# Patient Record
Sex: Male | Born: 1965
Health system: Southern US, Community
[De-identification: ages and names within clinical notes are randomized; demographics above are authoritative.]

## PROBLEM LIST (undated history)

## (undated) DIAGNOSIS — I10 Essential (primary) hypertension: Secondary | ICD-10-CM

## (undated) DIAGNOSIS — E119 Type 2 diabetes mellitus without complications: Secondary | ICD-10-CM

## (undated) DIAGNOSIS — R1310 Dysphagia, unspecified: Secondary | ICD-10-CM

## (undated) DIAGNOSIS — F319 Bipolar disorder, unspecified: Secondary | ICD-10-CM

## (undated) DIAGNOSIS — E785 Hyperlipidemia, unspecified: Secondary | ICD-10-CM

## (undated) DIAGNOSIS — J449 Chronic obstructive pulmonary disease, unspecified: Secondary | ICD-10-CM

## (undated) DIAGNOSIS — F39 Unspecified mood [affective] disorder: Secondary | ICD-10-CM

## (undated) DIAGNOSIS — J4489 Other specified chronic obstructive pulmonary disease: Secondary | ICD-10-CM

## (undated) DIAGNOSIS — G629 Polyneuropathy, unspecified: Secondary | ICD-10-CM

## (undated) DIAGNOSIS — F419 Anxiety disorder, unspecified: Secondary | ICD-10-CM

## (undated) DIAGNOSIS — R0602 Shortness of breath: Secondary | ICD-10-CM

## (undated) DIAGNOSIS — Z931 Gastrostomy status: Secondary | ICD-10-CM

## (undated) DIAGNOSIS — E46 Unspecified protein-calorie malnutrition: Secondary | ICD-10-CM

## (undated) DIAGNOSIS — J690 Pneumonitis due to inhalation of food and vomit: Secondary | ICD-10-CM

## (undated) DIAGNOSIS — F17200 Nicotine dependence, unspecified, uncomplicated: Secondary | ICD-10-CM

## (undated) DIAGNOSIS — D619 Aplastic anemia, unspecified: Secondary | ICD-10-CM

## (undated) DIAGNOSIS — D649 Anemia, unspecified: Secondary | ICD-10-CM

## (undated) DIAGNOSIS — G40909 Epilepsy, unspecified, not intractable, without status epilepticus: Secondary | ICD-10-CM

## (undated) DIAGNOSIS — F1410 Cocaine abuse, uncomplicated: Secondary | ICD-10-CM

## (undated) DIAGNOSIS — R079 Chest pain, unspecified: Secondary | ICD-10-CM

## (undated) HISTORY — DX: Hyperlipidemia, unspecified: E78.5

## (undated) HISTORY — DX: Aplastic anemia, unspecified: D61.9

## (undated) HISTORY — DX: Epilepsy, unspecified, not intractable, without status epilepticus: G40.909

## (undated) HISTORY — PX: OTHER SURGICAL HISTORY: SHX169

## (undated) HISTORY — DX: Chronic obstructive pulmonary disease, unspecified: J44.9

## (undated) HISTORY — DX: Other specified chronic obstructive pulmonary disease: J44.89

## (undated) HISTORY — DX: Cocaine abuse, uncomplicated: F14.10

## (undated) HISTORY — DX: Nicotine dependence, unspecified, uncomplicated: F17.200

## (undated) HISTORY — DX: Shortness of breath: R06.02

## (undated) HISTORY — DX: Essential (primary) hypertension: I10

## (undated) HISTORY — DX: Chest pain, unspecified: R07.9

## (undated) HISTORY — DX: Polyneuropathy, unspecified: G62.9

## (undated) HISTORY — DX: Type 2 diabetes mellitus without complications: E11.9

---

## 2006-07-19 ENCOUNTER — Ambulatory Visit: Payer: Self-pay | Admitting: Cardiology

## 2010-09-30 DIAGNOSIS — R079 Chest pain, unspecified: Secondary | ICD-10-CM

## 2010-10-01 ENCOUNTER — Inpatient Hospital Stay (HOSPITAL_COMMUNITY)
Admission: AD | Admit: 2010-10-01 | Discharge: 2010-10-02 | DRG: 287 | Disposition: A | Discharge status: Home / Self Care | Payer: Medicaid Other | Source: Other Acute Inpatient Hospital | Attending: Cardiology | Admitting: Cardiology

## 2010-10-01 DIAGNOSIS — E669 Obesity, unspecified: Secondary | ICD-10-CM | POA: Diagnosis present

## 2010-10-01 DIAGNOSIS — E119 Type 2 diabetes mellitus without complications: Secondary | ICD-10-CM | POA: Diagnosis present

## 2010-10-01 DIAGNOSIS — K219 Gastro-esophageal reflux disease without esophagitis: Secondary | ICD-10-CM | POA: Diagnosis present

## 2010-10-01 DIAGNOSIS — R079 Chest pain, unspecified: Secondary | ICD-10-CM

## 2010-10-01 DIAGNOSIS — F1410 Cocaine abuse, uncomplicated: Secondary | ICD-10-CM | POA: Diagnosis present

## 2010-10-01 DIAGNOSIS — Z794 Long term (current) use of insulin: Secondary | ICD-10-CM

## 2010-10-01 DIAGNOSIS — I10 Essential (primary) hypertension: Secondary | ICD-10-CM | POA: Diagnosis present

## 2010-10-01 DIAGNOSIS — R0789 Other chest pain: Principal | ICD-10-CM | POA: Diagnosis present

## 2010-10-01 DIAGNOSIS — E785 Hyperlipidemia, unspecified: Secondary | ICD-10-CM | POA: Diagnosis present

## 2010-10-01 DIAGNOSIS — E781 Pure hyperglyceridemia: Secondary | ICD-10-CM | POA: Diagnosis present

## 2010-10-01 DIAGNOSIS — I2510 Atherosclerotic heart disease of native coronary artery without angina pectoris: Secondary | ICD-10-CM | POA: Diagnosis present

## 2010-10-01 DIAGNOSIS — G40909 Epilepsy, unspecified, not intractable, without status epilepticus: Secondary | ICD-10-CM | POA: Diagnosis present

## 2010-10-01 DIAGNOSIS — R9430 Abnormal result of cardiovascular function study, unspecified: Secondary | ICD-10-CM

## 2010-10-01 LAB — GLUCOSE, CAPILLARY: Glucose-Capillary: 300 mg/dL — ABNORMAL HIGH (ref 70–99)

## 2010-10-02 DIAGNOSIS — I2119 ST elevation (STEMI) myocardial infarction involving other coronary artery of inferior wall: Secondary | ICD-10-CM

## 2010-10-02 HISTORY — PX: CARDIAC CATHETERIZATION: SHX172

## 2010-10-02 LAB — TSH: TSH: 1.232 u[IU]/mL (ref 0.350–4.500)

## 2010-10-02 LAB — HEMOGLOBIN A1C: Hgb A1c MFr Bld: 7.9 % — ABNORMAL HIGH (ref <5.7)

## 2010-10-02 LAB — GLUCOSE, CAPILLARY: Glucose-Capillary: 227 mg/dL — ABNORMAL HIGH (ref 70–99)

## 2010-10-02 LAB — LIPID PANEL
Cholesterol: 395 mg/dL — ABNORMAL HIGH (ref 0–200)
Triglycerides: 2775 mg/dL — ABNORMAL HIGH (ref <150)
VLDL: UNDETERMINED mg/dL (ref 0–40)

## 2010-10-06 LAB — GLUCOSE, CAPILLARY

## 2010-10-07 NOTE — Cardiovascular Report (Signed)
  NAMEMarland Kitchen  MAICOL, BOWLAND NO.:  [id number]  MEDICAL RECORD NO.:  [id number]  LOCATION:  3736                         FACILITY:  [facility]  PHYSICIAN:  Rollene Rotunda, MD, FACCDATE OF BIRTH:  1965-06-20  DATE OF PROCEDURE:  10/02/2010 DATE OF DISCHARGE:                           CARDIAC CATHETERIZATION   PRIMARY CARE PHYSICIAN:  Wyvonnia Lora, MD  CARDIOLOGIST:  Learta Codding, MD,FACC  PROCEDURE:  Left heart catheterization/coronary arteriography.  INDICATION:  The patient with chest pain and questionable mild inferolateral ischemia on a stress perfusion study.  PROCEDURE NOTE:  Left heart catheterization was performed with right femoral artery and tibial artery was cannulated using the anterior wall puncture.  A #5-French arterial sheath was inserted via the modified Seldinger technique.  Preformed Judkins and pigtail catheter were utilized.  The patient tolerated the procedure well and left the lab in stable condition.  RESULTS:  Hemodynamics LV 126/17, AO 129/88.  The coronaries left main normal, LAD mid luminal irregularity.  There were 3 small diagonals which were normal.  The LAD does not wrap the apex and was not particularly large vessel.  The circumflex in the AV groove had a long 25-30% stenosis entering into a large second marginal.  First marginal was small and normal.  Second marginal was large and normal.  The ramus intermediate was small and normal.  The right coronary artery was a large dominant vessel.  It was normal throughout its course.  PDA was large and normal.  Posterolateral was small branching and normal.  Left ventriculogram was obtained in the RAO projection.  The EF was 65% with normal wall motion.  CONCLUSION:  Nonobstructive mild coronary plaque.  No further cardiac workup is suggested.  The patient will follow up with Dr. Neita Carp for evaluation of nonanginal chest pain.     Rollene Rotunda, MD, [facility]     JH/MEDQ  D:   10/02/2010  T:  10/02/2010  Job:  [id number]  cc:   Fara Chute, MD  Electronically Signed by Rollene Rotunda MD [facility] on 10/07/2010 02:22:00 PM
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
## 2010-10-23 ENCOUNTER — Encounter: Payer: Medicaid Other | Admitting: Cardiology

## 2010-11-10 ENCOUNTER — Encounter: Payer: Self-pay | Admitting: Cardiology

## 2010-11-11 ENCOUNTER — Encounter: Payer: Self-pay | Admitting: *Deleted

## 2010-11-11 ENCOUNTER — Encounter: Payer: Self-pay | Admitting: Cardiology

## 2010-11-11 ENCOUNTER — Encounter: Payer: Medicaid Other | Admitting: Cardiology

## 2010-11-26 ENCOUNTER — Encounter (INDEPENDENT_AMBULATORY_CARE_PROVIDER_SITE_OTHER): Payer: Medicaid Other | Admitting: Cardiology

## 2010-11-26 NOTE — Progress Notes (Signed)
  Pt was rescheduled due to being sick. He will see primary MD today.

## 2010-11-26 NOTE — Progress Notes (Signed)
This encounter was created in error - please disregard.

## 2010-12-10 ENCOUNTER — Encounter: Payer: Medicaid Other | Admitting: Cardiology

## 2010-12-19 ENCOUNTER — Encounter: Payer: Self-pay | Admitting: Cardiology

## 2010-12-19 ENCOUNTER — Ambulatory Visit (INDEPENDENT_AMBULATORY_CARE_PROVIDER_SITE_OTHER): Payer: Medicaid Other | Admitting: Physician Assistant

## 2010-12-19 VITALS — BP 121/86 | HR 95 | Ht 72.0 in | Wt 243.0 lb

## 2010-12-19 DIAGNOSIS — F17200 Nicotine dependence, unspecified, uncomplicated: Secondary | ICD-10-CM

## 2010-12-19 DIAGNOSIS — E1165 Type 2 diabetes mellitus with hyperglycemia: Secondary | ICD-10-CM | POA: Insufficient documentation

## 2010-12-19 DIAGNOSIS — E781 Pure hyperglyceridemia: Secondary | ICD-10-CM

## 2010-12-19 DIAGNOSIS — I10 Essential (primary) hypertension: Secondary | ICD-10-CM | POA: Insufficient documentation

## 2010-12-19 DIAGNOSIS — Z720 Tobacco use: Secondary | ICD-10-CM

## 2010-12-19 DIAGNOSIS — F17210 Nicotine dependence, cigarettes, uncomplicated: Secondary | ICD-10-CM | POA: Insufficient documentation

## 2010-12-19 DIAGNOSIS — K219 Gastro-esophageal reflux disease without esophagitis: Secondary | ICD-10-CM

## 2010-12-19 DIAGNOSIS — I2510 Atherosclerotic heart disease of native coronary artery without angina pectoris: Secondary | ICD-10-CM

## 2010-12-19 DIAGNOSIS — E785 Hyperlipidemia, unspecified: Secondary | ICD-10-CM

## 2010-12-19 MED ORDER — ASPIRIN EC 81 MG PO TBEC: 81.0000 mg | DELAYED_RELEASE_TABLET | Freq: Every day | ORAL | Status: AC

## 2010-12-19 NOTE — Patient Instructions (Signed)
   Aspirin 81mg  daily  Follow up as needed

## 2010-12-19 NOTE — Assessment & Plan Note (Signed)
Well controlled 

## 2010-12-19 NOTE — Assessment & Plan Note (Signed)
Stressed importance of smoking cessation, for greater than 3 minutes.

## 2010-12-19 NOTE — Progress Notes (Signed)
cc: Post catheterization followup  HPI: Patient presents to our clinic for the first time, after initial evaluation here at [facility] for atypical CP, in the context of an abnormal nuclear imaging study. This was read by Dr. Diona Browner, and was suggestive of possible mild inferolateral ischemia. Patient did rule out for MI with normal cardiac markers. In the context of numerous cardiac risk factors, however, including diabetes and ongoing tobacco, we recommended diagnostic coronary angiography. This yielded mild, nonobstructive CAD with normal LV function (EF 65%), with normal wall motion.   Symptoms were felt to be noncardiac, and he was subsequently placed on anti-reflux medications. He reports today that his CP subsequently completely resolved, following initiation of these medications.  Unfortunately, patient continues to smoke, albeit at a much reduced level.  Patient presented with history of hypertriglyceridemia. Followup labs notable for triglycerides of 2800, with total cholesterol 395. He is being treated both with Lipitor and Lopid.    PMH:  reviewed and listed in Problem List in electronic Records (and see below)  Allergies/SH/FH: available in Electronic Records for review  Current Outpatient Prescriptions  Medication Sig Dispense Refill  . albuterol (PROVENTIL HFA;VENTOLIN HFA) 108 (90 BASE) MCG/ACT inhaler Inhale 1 puff into the lungs every 4 (four) hours as needed.        Marland Kitchen albuterol (PROVENTIL) (2.5 MG/3ML) 0.083% nebulizer solution Take 2.5 mg by nebulization every 4 (four) hours as needed.        Marland Kitchen atorvastatin (LIPITOR) 40 MG tablet Take 40 mg by mouth daily.        . benazepril (LOTENSIN) 10 MG tablet Take 10 mg by mouth daily.        . carbamazepine (TEGRETOL) 200 MG tablet Take 400 mg by mouth 2 (two) times daily.        . cetirizine (ZYRTEC) 10 MG tablet Take 10 mg by mouth daily.        . colchicine 0.6 MG tablet Take 0.6 mg by mouth 3 (three) times daily as  needed.        Marland Kitchen gemfibrozil (LOPID) 600 MG tablet Take 600 mg by mouth 2 (two) times daily before a meal.        . insulin glargine (LANTUS) 100 UNIT/ML injection Inject 40 Units into the skin at bedtime.       . metFORMIN (GLUCOPHAGE) 1000 MG tablet Take 1,000 mg by mouth 2 (two) times daily with a meal.        . aspirin EC 81 MG tablet Take 1 tablet (81 mg total) by mouth daily.        ROS: no nausea, vomiting; no fever, chills; no melena, hematochezia; no claudication  PHYSICAL EXAM:  BP 121/86  Pulse 95  Ht 6' (1.829 m)  Wt 243 lb (110.224 kg)  BMI 32.96 kg/m2  SpO2 95%  GENERAL: 45 year old, morbidly obese male; NAD HEENT: NCAT, PERRLA, EOMI; sclera clear; no xanthelasma NECK: palpable bilateral carotid pulses, no bruits LUNGS: Diminished breath sounds, faint expiratory wheezes CARDIAC: RRR (S1, S2); no significant murmurs; no rubs or gallops ABDOMEN: Protuberant; intact BS EXTREMETIES: intact distal pulses; no significant peripheral edema SKIN: warm/dry; no obvious rash/lesions MUSCULOSKELETAL: no joint deformity NEURO: no focal deficit; NL affect   EKG:     ASSESSMENT & PLAN:

## 2010-12-19 NOTE — Assessment & Plan Note (Signed)
Quiescent on current medication regimen, per Dr. Margo Common.

## 2010-12-19 NOTE — Assessment & Plan Note (Addendum)
No further cardiac workup indicated at this time. Aggressive secondary prevention recommended, including smoking cessation. Low-dose aspirin added to current medication regimen, and we will have patient return to Dr. Andee Lineman on an as needed basis.

## 2010-12-19 NOTE — Assessment & Plan Note (Signed)
On Lipitor and fibrate, monitored by Dr. Margo Common. Recommended target LDL 70 or less, if feasible.

## 2011-06-29 ENCOUNTER — Other Ambulatory Visit (HOSPITAL_COMMUNITY): Payer: Self-pay | Admitting: Internal Medicine

## 2012-10-20 DIAGNOSIS — R0602 Shortness of breath: Secondary | ICD-10-CM

## 2013-03-27 ENCOUNTER — Other Ambulatory Visit (HOSPITAL_COMMUNITY): Payer: Self-pay | Admitting: Physician Assistant

## 2016-07-07 ENCOUNTER — Ambulatory Visit: Payer: Self-pay | Admitting: Family Medicine

## 2016-07-15 ENCOUNTER — Ambulatory Visit: Payer: Self-pay | Admitting: Family Medicine

## 2016-07-16 ENCOUNTER — Encounter: Payer: Self-pay | Admitting: Family Medicine

## 2016-12-07 DIAGNOSIS — R569 Unspecified convulsions: Secondary | ICD-10-CM | POA: Insufficient documentation

## 2016-12-07 DIAGNOSIS — J441 Chronic obstructive pulmonary disease with (acute) exacerbation: Secondary | ICD-10-CM | POA: Insufficient documentation

## 2016-12-07 DIAGNOSIS — J209 Acute bronchitis, unspecified: Secondary | ICD-10-CM | POA: Insufficient documentation

## 2016-12-07 DIAGNOSIS — M10072 Idiopathic gout, left ankle and foot: Secondary | ICD-10-CM | POA: Insufficient documentation

## 2016-12-07 DIAGNOSIS — F411 Generalized anxiety disorder: Secondary | ICD-10-CM | POA: Insufficient documentation

## 2016-12-14 ENCOUNTER — Encounter: Payer: Self-pay | Admitting: Neurology

## 2017-02-01 DIAGNOSIS — M25561 Pain in right knee: Secondary | ICD-10-CM | POA: Insufficient documentation

## 2017-02-01 DIAGNOSIS — M1A0690 Idiopathic chronic gout, unspecified knee, without tophus (tophi): Secondary | ICD-10-CM | POA: Insufficient documentation

## 2017-03-02 ENCOUNTER — Ambulatory Visit: Payer: Self-pay | Admitting: Neurology

## 2017-05-10 ENCOUNTER — Ambulatory Visit: Payer: Medicaid Other | Admitting: Neurology

## 2017-05-10 ENCOUNTER — Other Ambulatory Visit: Payer: Self-pay

## 2017-05-10 ENCOUNTER — Encounter: Payer: Self-pay | Admitting: Neurology

## 2017-05-10 VITALS — BP 112/70 | HR 93 | Ht 72.0 in | Wt 223.0 lb

## 2017-05-10 DIAGNOSIS — G40219 Localization-related (focal) (partial) symptomatic epilepsy and epileptic syndromes with complex partial seizures, intractable, without status epilepticus: Secondary | ICD-10-CM

## 2017-05-10 DIAGNOSIS — E0842 Diabetes mellitus due to underlying condition with diabetic polyneuropathy: Secondary | ICD-10-CM

## 2017-05-10 MED ORDER — CARBAMAZEPINE ER 400 MG PO TB12: 400.0000 mg | ORAL_TABLET | Freq: Two times a day (BID) | ORAL | 3 refills | Status: DC

## 2017-05-10 MED ORDER — GABAPENTIN 300 MG PO CAPS: ORAL_CAPSULE | ORAL | 6 refills | Status: DC
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
## 2017-05-20 ENCOUNTER — Telehealth: Payer: Self-pay | Admitting: Neurology

## 2017-05-20 ENCOUNTER — Other Ambulatory Visit: Payer: Medicaid Other

## 2017-05-20 NOTE — Telephone Encounter (Signed)
Patient has not heard from anyone regarding setting up his MRI. Please Call. Thanks

## 2017-05-20 NOTE — Telephone Encounter (Signed)
Message sent to Dr. Karel JarvisAquino.  I need to fax LOV notes with order and referral to [facility]. Pt is having OPEN MRI.

## 2017-05-23 ENCOUNTER — Encounter: Payer: Self-pay | Admitting: Neurology

## 2017-05-25 ENCOUNTER — Telehealth: Payer: Self-pay | Admitting: Neurology

## 2017-05-25 NOTE — Telephone Encounter (Signed)
Brent BraunKaren called and states that they have not heard from anyone about the MRI. Please call her to let her know the status or a number she can call to get it set up

## 2017-05-25 NOTE — Telephone Encounter (Signed)
Called and spoke with Clydie BraunKaren, Sharene Buttersadvsd her authorization was confirmed just today (per Meagan), and provided her with [facility] number to call and schedxule.

## 2017-06-02 ENCOUNTER — Ambulatory Visit (INDEPENDENT_AMBULATORY_CARE_PROVIDER_SITE_OTHER): Payer: Medicaid Other | Admitting: Neurology

## 2017-06-02 DIAGNOSIS — G40219 Localization-related (focal) (partial) symptomatic epilepsy and epileptic syndromes with complex partial seizures, intractable, without status epilepticus: Secondary | ICD-10-CM

## 2017-06-02 DIAGNOSIS — E0842 Diabetes mellitus due to underlying condition with diabetic polyneuropathy: Secondary | ICD-10-CM | POA: Diagnosis not present

## 2017-06-07 ENCOUNTER — Encounter: Payer: Self-pay | Admitting: Neurology

## 2017-06-09 ENCOUNTER — Telehealth: Payer: Self-pay | Admitting: Neurology

## 2017-06-09 NOTE — Procedures (Signed)
ELECTROENCEPHALOGRAM REPORT  Date of Study: 06/02/2017  Patient's Name: Brent Driversimothy C Violett MRN: [id number] Date of Birth: [birth year]  Referring Provider: Dr. Patrcia DollyKaren Jonda Alanis  Clinical History: This is a 52 year old man with seizures after bouts of coughing. EEG for classification.  Medications: NEURONTIN 300 MG capsule PROVENTIL HFA;VENTOLIN HFA 108 (90 BASE) MCG/ACT inhaler PROVENTIL (2.5 MG/3ML) 0.083% nebulizer solution LIPITOR 40 MG tablet  LOTENSIN 10 MG tablet  VOLTAREN 75 MG EC tablet fenofibrate 160 MG tablet  LANTUS 100 UNIT/ML injection  GLUCOPHAGE 1000 MG tablet  Technical Summary: A multichannel digital 1-hour EEG recording measured by the international 10-20 system with electrodes applied with paste and impedances below 5000 ohms performed in our laboratory with EKG monitoring in an awake and asleep patient.  Hyperventilation was not performed. Photic stimulation was performed.  The digital EEG was referentially recorded, reformatted, and digitally filtered in a variety of bipolar and referential montages for optimal display.    Description: The patient is awake and asleep during the recording.  During maximal wakefulness, there is a symmetric, medium voltage 9 Hz posterior dominant rhythm that attenuates with eye opening.  The record is symmetric.  During drowsiness and stage I sleep, there is an increase in theta slowing of the background with rare vertex waves seen.  Photic stimulation did not elicit any abnormalities.  There were no epileptiform discharges or electrographic seizures seen.    EKG lead was unremarkable.  Impression: This 1-hour awake and asleep EEG is normal.    Clinical Correlation: A normal EEG does not exclude a clinical diagnosis of epilepsy.  If further clinical questions remain, prolonged EEG may be helpful.  Clinical correlation is advised.   Patrcia DollyKaren Markas [place], M.D.

## 2017-06-09 NOTE — Telephone Encounter (Signed)
MRI brain with and without contrast done 06/07/17 reported small encephalomalacia in the superior frontal gyrus posteriorly, in the ACA distribution. Soft tissue abnormality in the parietal scalp.

## 2017-06-22 DIAGNOSIS — M159 Polyosteoarthritis, unspecified: Secondary | ICD-10-CM | POA: Insufficient documentation

## 2017-07-22 DIAGNOSIS — L0390 Cellulitis, unspecified: Secondary | ICD-10-CM | POA: Insufficient documentation

## 2017-07-26 DIAGNOSIS — M25461 Effusion, right knee: Secondary | ICD-10-CM | POA: Insufficient documentation

## 2017-07-26 DIAGNOSIS — M1A1720 Lead-induced chronic gout, left ankle and foot, without tophus (tophi): Secondary | ICD-10-CM | POA: Insufficient documentation

## 2017-10-27 DIAGNOSIS — S83271A Complex tear of lateral meniscus, current injury, right knee, initial encounter: Secondary | ICD-10-CM | POA: Insufficient documentation

## 2017-11-11 ENCOUNTER — Ambulatory Visit: Payer: Medicaid Other | Admitting: Neurology

## 2017-11-15 DIAGNOSIS — Z9889 Other specified postprocedural states: Secondary | ICD-10-CM | POA: Insufficient documentation

## 2018-01-19 DIAGNOSIS — R29898 Other symptoms and signs involving the musculoskeletal system: Secondary | ICD-10-CM | POA: Insufficient documentation

## 2018-06-07 ENCOUNTER — Other Ambulatory Visit: Payer: Self-pay

## 2018-06-07 MED ORDER — CARBAMAZEPINE ER 400 MG PO TB12: 400.0000 mg | ORAL_TABLET | Freq: Two times a day (BID) | ORAL | 3 refills | Status: DC

## 2018-06-07 MED ORDER — GABAPENTIN 300 MG PO CAPS: ORAL_CAPSULE | ORAL | 6 refills | Status: DC

## 2018-07-04 ENCOUNTER — Encounter: Payer: Self-pay | Admitting: Neurology

## 2018-07-06 ENCOUNTER — Ambulatory Visit: Payer: Medicaid Other | Admitting: Neurology

## 2018-09-27 DIAGNOSIS — L309 Dermatitis, unspecified: Secondary | ICD-10-CM | POA: Insufficient documentation

## 2018-09-27 DIAGNOSIS — R1310 Dysphagia, unspecified: Secondary | ICD-10-CM | POA: Insufficient documentation

## 2018-10-12 ENCOUNTER — Ambulatory Visit (INDEPENDENT_AMBULATORY_CARE_PROVIDER_SITE_OTHER): Payer: Medicaid Other | Admitting: Nurse Practitioner

## 2018-10-12 ENCOUNTER — Ambulatory Visit (HOSPITAL_COMMUNITY)
Admission: RE | Admit: 2018-10-12 | Discharge: 2018-10-12 | Disposition: A | Discharge status: Home / Self Care | Payer: Medicaid Other | Source: Ambulatory Visit | Attending: Nurse Practitioner | Admitting: Nurse Practitioner

## 2018-10-12 ENCOUNTER — Other Ambulatory Visit: Payer: Self-pay

## 2018-10-12 ENCOUNTER — Encounter (INDEPENDENT_AMBULATORY_CARE_PROVIDER_SITE_OTHER): Payer: Self-pay | Admitting: *Deleted

## 2018-10-12 ENCOUNTER — Encounter (INDEPENDENT_AMBULATORY_CARE_PROVIDER_SITE_OTHER): Payer: Self-pay | Admitting: Nurse Practitioner

## 2018-10-12 VITALS — BP 126/78 | HR 89 | Temp 97.9°F | Resp 18 | Ht 72.0 in | Wt 227.1 lb

## 2018-10-12 DIAGNOSIS — R05 Cough: Secondary | ICD-10-CM

## 2018-10-12 DIAGNOSIS — R059 Cough, unspecified: Secondary | ICD-10-CM

## 2018-10-12 DIAGNOSIS — J441 Chronic obstructive pulmonary disease with (acute) exacerbation: Secondary | ICD-10-CM | POA: Diagnosis not present

## 2018-10-12 DIAGNOSIS — R1314 Dysphagia, pharyngoesophageal phase: Secondary | ICD-10-CM | POA: Diagnosis not present

## 2018-10-12 DIAGNOSIS — K219 Gastro-esophageal reflux disease without esophagitis: Secondary | ICD-10-CM | POA: Diagnosis not present

## 2018-10-12 IMAGING — DX CHEST - 2 VIEW
2 series · 2 of 2 positions shown · non-contrast
Comparison: Multiple priors, most recent comparison [DATE]

CLINICAL DATA: COPD, cough, wheezes

EXAM:
CHEST - 2 VIEW

[chest pa]
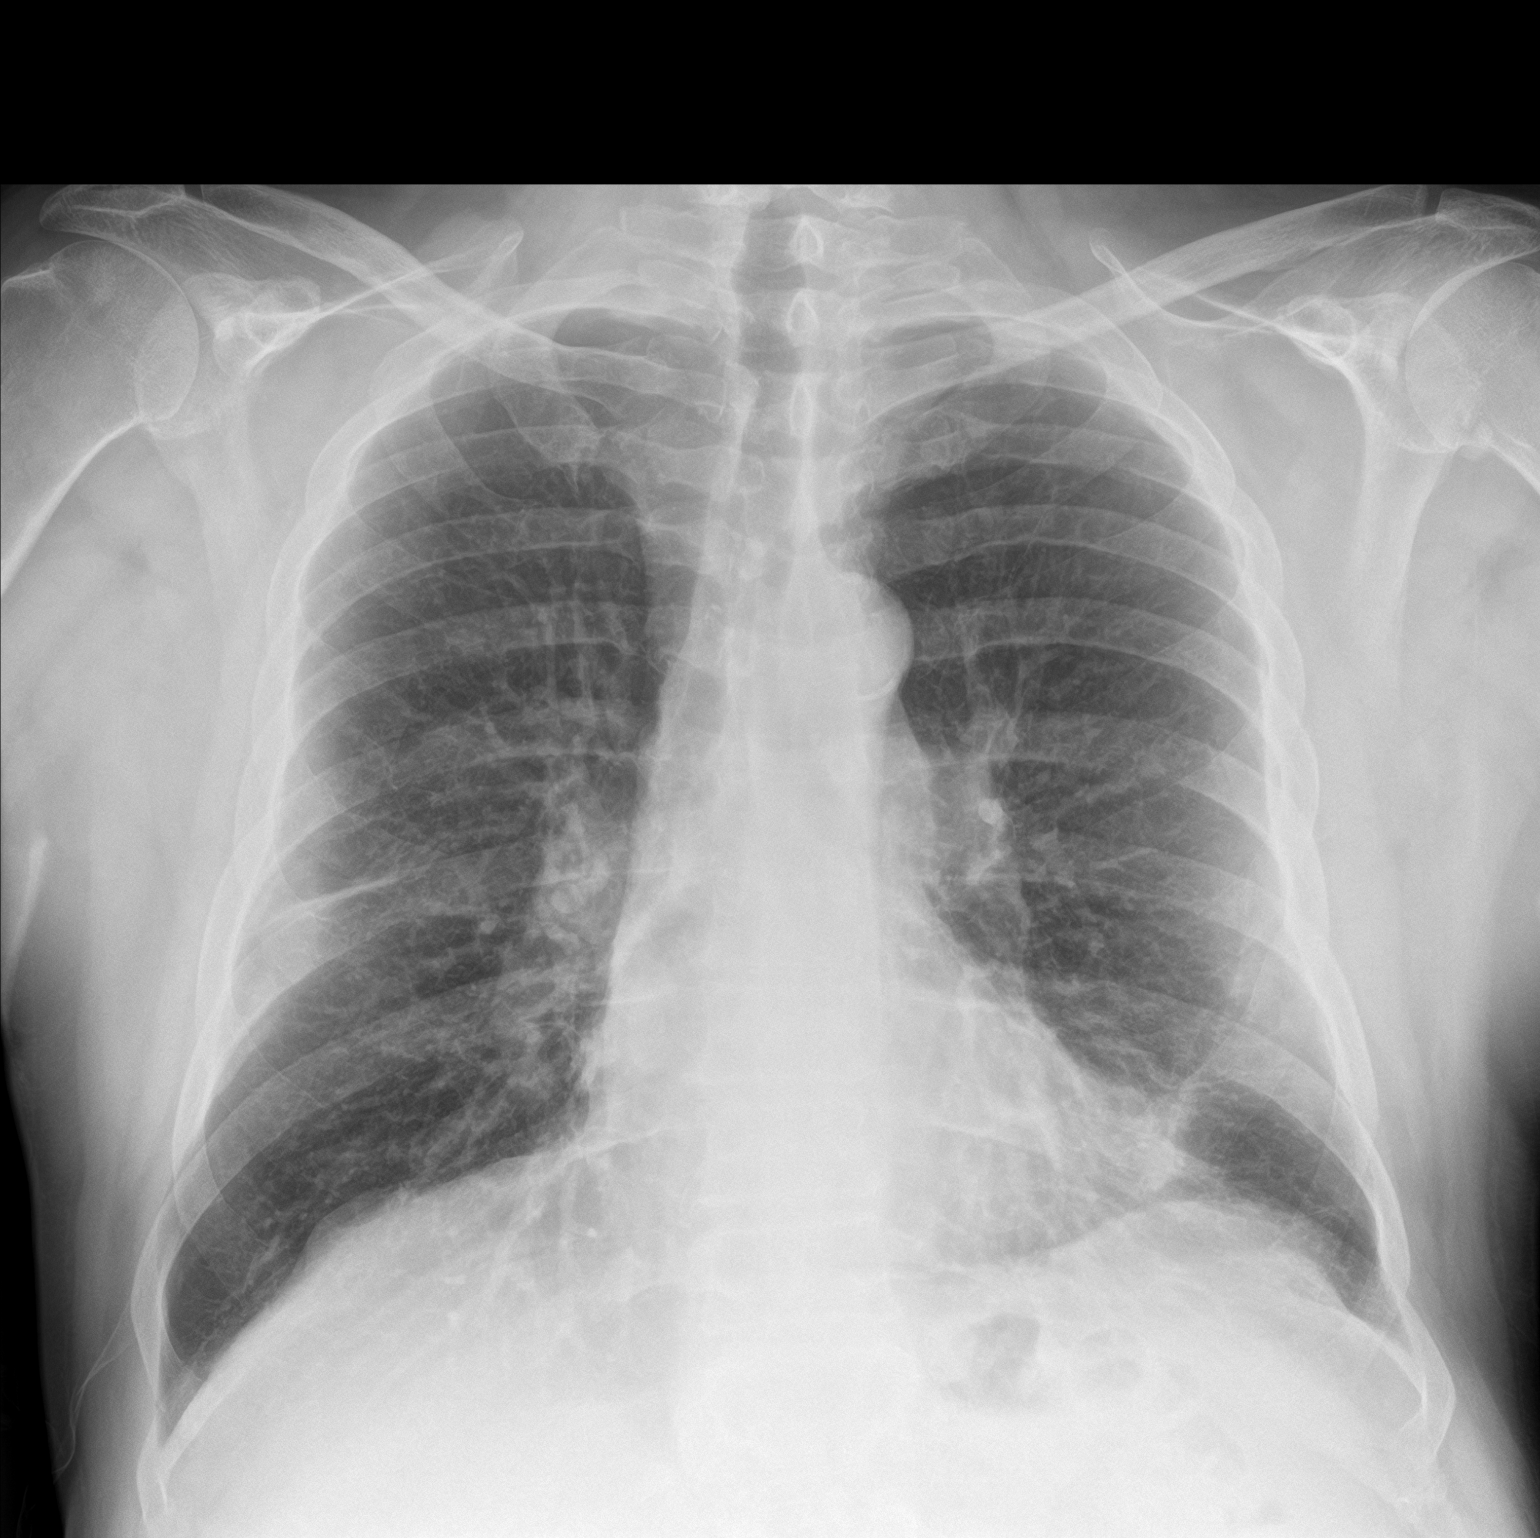

[chest lat]
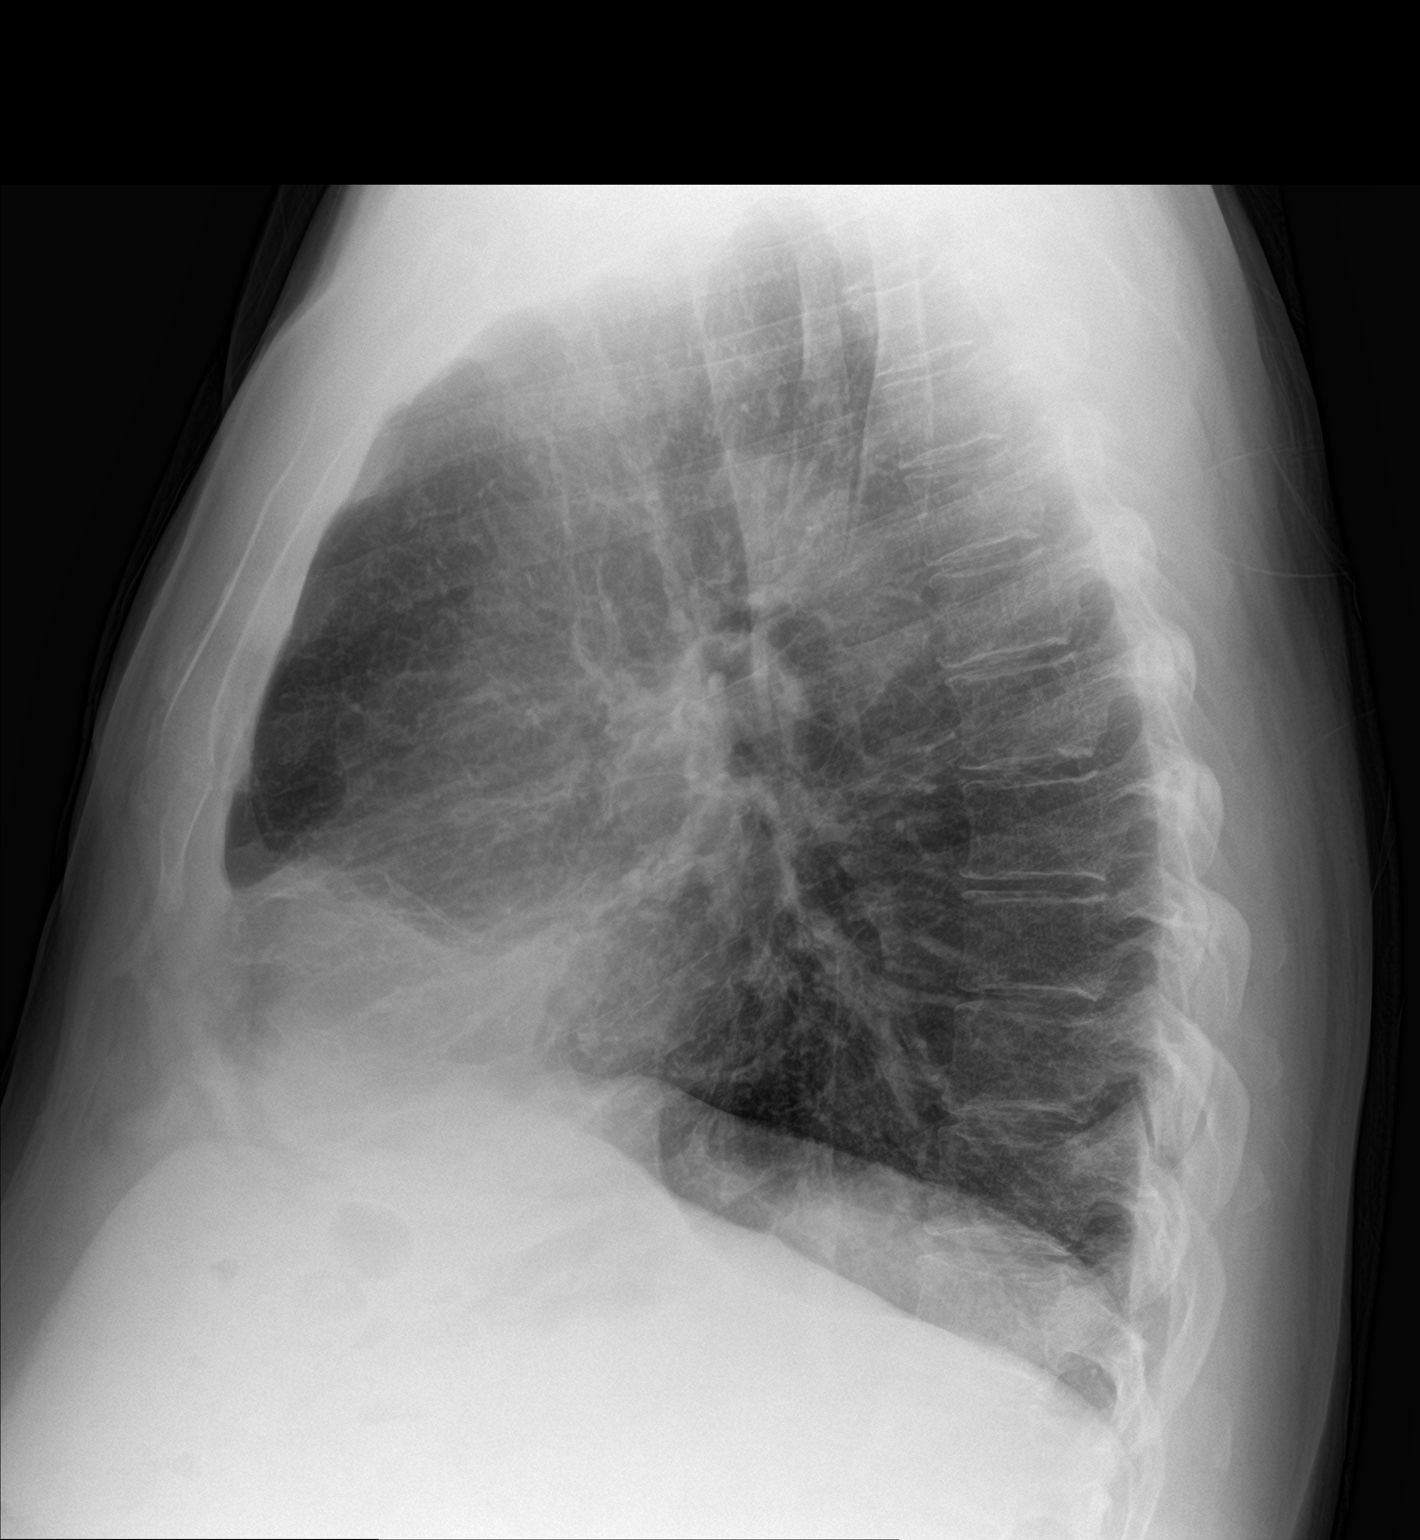

[2 of 2 positions shown; findings below may reference images not displayed]

FINDINGS: Coarse interstitial opacities are present throughout both lungs.
Bandlike areas of opacity are present within the right middle lobe,
left lower lobe and lingula, similar to prior studies likely
reflecting scarring and/or superimposed atelectatic change. No new
consolidative process. No pneumothorax or effusion. The aorta is
calcified. The remaining cardiomediastinal contours are
unremarkable. Calcification of the carotids is noted. No acute
osseous or soft tissue abnormality.
IMPRESSION: Bandlike areas of basilar atelectasis and/or scarring in region of
prior infection seen on comparison studies.

No new consolidative process.

## 2018-10-12 MED ORDER — OMEPRAZOLE 40 MG PO CPDR: 40.0000 mg | DELAYED_RELEASE_CAPSULE | Freq: Two times a day (BID) | ORAL | 1 refills | Status: DC
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
## 2018-10-12 NOTE — Patient Instructions (Addendum)
1. Call your primary care doctor today for COPD/cough evaluation and COVID19 testing   2. Chest Xray   3. Schedule Barium Swallow with tablet   4. Follow up in office in 2 weeks, will discuss scheduling an upper endoscopy after your breathing symptoms have improved   5. Continue Omeprazole 40mg  one capsule by mouth twice daily to be taken 30 minutes before breakfast and dinner

## 2018-10-17 ENCOUNTER — Ambulatory Visit (HOSPITAL_COMMUNITY): Payer: Medicaid Other

## 2018-10-25 ENCOUNTER — Ambulatory Visit (HOSPITAL_COMMUNITY)
Admission: RE | Admit: 2018-10-25 | Discharge: 2018-10-25 | Disposition: A | Discharge status: Home / Self Care | Payer: Medicaid Other | Source: Ambulatory Visit | Attending: Nurse Practitioner | Admitting: Nurse Practitioner

## 2018-10-25 ENCOUNTER — Other Ambulatory Visit: Payer: Self-pay

## 2018-10-25 ENCOUNTER — Other Ambulatory Visit (INDEPENDENT_AMBULATORY_CARE_PROVIDER_SITE_OTHER): Payer: Self-pay | Admitting: Nurse Practitioner

## 2018-10-25 DIAGNOSIS — R1314 Dysphagia, pharyngoesophageal phase: Secondary | ICD-10-CM | POA: Diagnosis not present
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
## 2018-11-02 ENCOUNTER — Encounter (INDEPENDENT_AMBULATORY_CARE_PROVIDER_SITE_OTHER): Payer: Self-pay | Admitting: Nurse Practitioner

## 2018-11-02 ENCOUNTER — Ambulatory Visit (INDEPENDENT_AMBULATORY_CARE_PROVIDER_SITE_OTHER): Payer: Medicaid Other | Admitting: Nurse Practitioner

## 2018-11-02 ENCOUNTER — Other Ambulatory Visit: Payer: Self-pay

## 2018-11-02 VITALS — BP 156/90 | HR 89 | Temp 97.9°F | Ht 72.0 in | Wt 231.7 lb

## 2018-11-02 DIAGNOSIS — R059 Cough, unspecified: Secondary | ICD-10-CM

## 2018-11-02 DIAGNOSIS — R1319 Other dysphagia: Secondary | ICD-10-CM

## 2018-11-02 DIAGNOSIS — R05 Cough: Secondary | ICD-10-CM | POA: Diagnosis not present

## 2018-11-02 DIAGNOSIS — G4730 Sleep apnea, unspecified: Secondary | ICD-10-CM

## 2018-11-02 NOTE — Patient Instructions (Signed)
1. Our office will contact you regarding the swallow study with a speech pathologist appointment   2. Do not eat within 3 hours of going to bed. Do not eat during the middle of the night   3. No alcohol  4. Follow up in the office with Dr. Laural Golden in 3 months

## 2018-11-02 NOTE — Progress Notes (Addendum)
   Subjective:    Patient ID: Brent [name], male    DOB: Nov 25, 1965, 53 y.o.   MRN: [id number]  HPI [name] is a 52 year old male with a past medical history significant for remote aplastic anemia, DM II, COPD, sleep apnea ( he does nont use Cpap), smoker, past substance abuse, seizure disorder. He presents today for further evaluation regarding dysphagia. Refer to office visit 10/12/2018. He underwent a barium swallow with tablet on 10/25/2018, see results below.  9/082020: Laryngeal penetration and silent aspiration of contrast material into the trachea, with no spontaneous cough reflex identified; consider follow-up speech therapy evaluation. Patient was able to voluntarily cough a significant portion of the contrast from the trachea. No evidence of esophageal mass or stricture.  He continues to have "choking" episodes 3 to 4 times daily. He swallows solid foods or liquids which get stuck in his throat which results in excessive coughing. He eats large meals. He continues to awaken in the middle of the night and eats foods at 1 or 2am most nights. Last night, he ate a peanut butter and jelly sandwich and 2 cookies at 2am. He drinks 1 mixed drink every evening which consists of 3 to 4 shot glasses of liquor. He coughs every time he swallows the alcohol. He continues to smoke 1 to 1/2 pack of cigarettes daily.  He has sleep apnea, refused Cpap many years ago.  He is passing normal bowel movements daily.  No rectal bleeding or melena.  His wife is present.      Objective:   Physical Exam  Blood pressure (!) 156/90, pulse 89, temperature 97.9 F (36.6 C), temperature source Oral, height 6' (1.829 m), weight 231 lb 11.2 oz (105.1 kg). General: 53 year old obese male he walks with a left leg limp is alert in no acute distress Eyes: Sclera nonicteric, conjunctiva pink Heart: Regular rate and rhythm, no murmurs Lungs: Breath sounds coarse but clear throughout Abdomen: Obese,  soft, nontender, no masses or organomegaly, extensive midline scar and large right upper quadrant scar noted. Extremities: No edema Neuro: Alert and oriented x4, speech is clear, gait is stable with left lower leg limp     Assessment & Plan:   1.  Oral pharyngeal dysphasia with evidence of silent reflux on barium swallow study at high risk for recurrent aspiration pneumonia.  -Swallow study with speech pathologist -No alcohol advised -Sleep with the head of the bed elevated 45 degrees -Do not eat within 3 hours of going to bed, do not eat during the middle the night -Recommended pulmonary consult -Follow-up in office in 3 months -Further recommendations per Dr. Laural Golden  2.  GERD -See # 1  3.  Colon cancer screening, patient reports last colonoscopy was in 2016  4.  Diabetes mellitus type 2, patient is noncompliant with diabetic diet

## 2018-11-03 ENCOUNTER — Other Ambulatory Visit (HOSPITAL_COMMUNITY): Payer: Self-pay | Admitting: Specialist

## 2018-11-03 ENCOUNTER — Ambulatory Visit (INDEPENDENT_AMBULATORY_CARE_PROVIDER_SITE_OTHER): Payer: Medicaid Other | Admitting: Nurse Practitioner

## 2018-11-03 DIAGNOSIS — G4730 Sleep apnea, unspecified: Secondary | ICD-10-CM | POA: Insufficient documentation

## 2018-11-03 DIAGNOSIS — R1319 Other dysphagia: Secondary | ICD-10-CM

## 2018-11-03 DIAGNOSIS — R05 Cough: Secondary | ICD-10-CM | POA: Insufficient documentation

## 2018-11-03 DIAGNOSIS — R059 Cough, unspecified: Secondary | ICD-10-CM | POA: Insufficient documentation

## 2018-11-03 DIAGNOSIS — G4733 Obstructive sleep apnea (adult) (pediatric): Secondary | ICD-10-CM | POA: Insufficient documentation

## 2018-11-07 ENCOUNTER — Telehealth (HOSPITAL_COMMUNITY): Payer: Self-pay

## 2018-11-07 NOTE — Telephone Encounter (Signed)
11/07/18  I called and spoke with the patient's mom and gave her the MBSS information.  She said she wrote it down and would give the information to the patient.  I also texted the therapist, Clyde Canterbury, to let her know about it also.

## 2018-11-11 ENCOUNTER — Ambulatory Visit (HOSPITAL_COMMUNITY): Payer: Medicaid Other

## 2018-11-11 ENCOUNTER — Telehealth (HOSPITAL_COMMUNITY): Payer: Self-pay

## 2018-11-11 ENCOUNTER — Ambulatory Visit (HOSPITAL_COMMUNITY): Payer: Medicaid Other | Admitting: Speech Pathology

## 2018-11-11 NOTE — Telephone Encounter (Signed)
11/11/18  Anne from Dr. Olevia Perches office called to say the patient wanted to cancel and reschedule for 10/2

## 2018-11-14 ENCOUNTER — Telehealth (HOSPITAL_COMMUNITY): Payer: Self-pay

## 2018-11-14 NOTE — Telephone Encounter (Signed)
11/14/18  Called and left [name] a message to let her know the MBSS was scheduled for 10/1 and gave instruction on where to go.

## 2018-11-17 ENCOUNTER — Ambulatory Visit (HOSPITAL_COMMUNITY)
Admission: RE | Admit: 2018-11-17 | Discharge: 2018-11-17 | Disposition: A | Discharge status: Home / Self Care | Payer: Medicaid Other | Source: Ambulatory Visit | Attending: Internal Medicine | Admitting: Internal Medicine

## 2018-11-17 ENCOUNTER — Ambulatory Visit (HOSPITAL_COMMUNITY): Payer: Medicaid Other | Attending: Internal Medicine | Admitting: Speech Pathology

## 2018-11-17 ENCOUNTER — Other Ambulatory Visit: Payer: Self-pay

## 2018-11-17 ENCOUNTER — Encounter (HOSPITAL_COMMUNITY): Payer: Self-pay | Admitting: Speech Pathology

## 2018-11-17 DIAGNOSIS — R1319 Other dysphagia: Secondary | ICD-10-CM | POA: Insufficient documentation

## 2018-11-17 DIAGNOSIS — R1312 Dysphagia, oropharyngeal phase: Secondary | ICD-10-CM | POA: Diagnosis present
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
## 2018-11-22 ENCOUNTER — Other Ambulatory Visit: Payer: Self-pay | Admitting: *Deleted

## 2018-11-22 DIAGNOSIS — Z20822 Contact with and (suspected) exposure to covid-19: Secondary | ICD-10-CM

## 2018-11-24 LAB — NOVEL CORONAVIRUS, NAA: SARS-CoV-2, NAA: NOT DETECTED

## 2018-11-25 ENCOUNTER — Telehealth: Payer: Self-pay

## 2018-11-25 NOTE — Telephone Encounter (Signed)
Negative COVID results given. Patient results "NOT Detected." Caller expressed understanding. ° °

## 2018-11-26 DIAGNOSIS — E1169 Type 2 diabetes mellitus with other specified complication: Secondary | ICD-10-CM | POA: Insufficient documentation

## 2019-01-20 ENCOUNTER — Institutional Professional Consult (permissible substitution): Payer: Medicaid Other | Admitting: Internal Medicine

## 2019-01-26 ENCOUNTER — Other Ambulatory Visit: Payer: Self-pay

## 2019-01-26 DIAGNOSIS — Z20822 Contact with and (suspected) exposure to covid-19: Secondary | ICD-10-CM

## 2019-01-27 ENCOUNTER — Telehealth: Payer: Self-pay

## 2019-01-27 LAB — NOVEL CORONAVIRUS, NAA: SARS-CoV-2, NAA: NOT DETECTED

## 2019-01-27 NOTE — Telephone Encounter (Signed)
Given COVID 19 results, verbalizes understanding. 

## 2019-01-30 ENCOUNTER — Institutional Professional Consult (permissible substitution): Payer: Medicaid Other | Admitting: Pulmonary Disease

## 2019-01-30 ENCOUNTER — Institutional Professional Consult (permissible substitution): Payer: Medicaid Other | Admitting: Internal Medicine

## 2019-02-06 ENCOUNTER — Encounter (INDEPENDENT_AMBULATORY_CARE_PROVIDER_SITE_OTHER): Payer: Self-pay | Admitting: Internal Medicine

## 2019-02-06 ENCOUNTER — Ambulatory Visit (INDEPENDENT_AMBULATORY_CARE_PROVIDER_SITE_OTHER): Payer: Medicaid Other | Admitting: Internal Medicine

## 2019-02-06 ENCOUNTER — Other Ambulatory Visit: Payer: Self-pay

## 2019-02-06 VITALS — Temp 97.4°F

## 2019-02-06 DIAGNOSIS — R1312 Dysphagia, oropharyngeal phase: Secondary | ICD-10-CM

## 2019-02-06 DIAGNOSIS — E0842 Diabetes mellitus due to underlying condition with diabetic polyneuropathy: Secondary | ICD-10-CM | POA: Diagnosis not present

## 2019-02-06 DIAGNOSIS — K219 Gastro-esophageal reflux disease without esophagitis: Secondary | ICD-10-CM

## 2019-02-06 DIAGNOSIS — G40219 Localization-related (focal) (partial) symptomatic epilepsy and epileptic syndromes with complex partial seizures, intractable, without status epilepticus: Secondary | ICD-10-CM | POA: Diagnosis not present

## 2019-02-06 NOTE — Progress Notes (Signed)
Virtual Visit via Telephone Note  Patient had scheduled face-to-face visit today.  Because of Covid-19 pandemic it was decided to change to virtual visit and patient agreed. I connected with Brent [name] on 02/06/19 at 10:30 AM EST by telephone and verified that I am speaking with the correct person using two identifiers.  Location: Patient: Brent [name] Provider: office   I discussed the limitations, risks, security and privacy concerns of performing an evaluation and management service by telephone and the availability of in person appointments. I also discussed with the patient that there may be a patient responsible charge related to this service. The patient expressed understanding and agreed to proceed.   History of Present Illness:  Patient is 53 year old male who was initially seen on 10/12/2018 for symptoms of GERD dysphagia as well as coughing spells while eating.  He was also complaining of daily heartburn.  He was begun on omeprazole 40 mg p.o. twice daily.  Chest film was obtained and he did not have pneumonia or infiltrate.  Barium swallow was obtained and there was no evidence of esophageal stricture.  There was suggestion of esophageal dysmotility but barium pill passed from oral cavity into the stomach without any delay.  Barium study also revealed laryngeal penetration with silent aspiration of contrast into trachea without spontaneous cough reflex.  Patient was therefore referred for evaluation by speech pathologist.  He was seen by Ms. Genene Churn, CCC-SLP and documented to have oropharyngeal dysphagia.  She recommended pharyngeal strengthening exercises as well as compensatory strategies and suggested follow-up visit as needed. His GI history is also significant for colonoscopy in July 2017 by Dr. Britta Mccreedy of [facility].  3 small polyps were removed and only 1 was tubular adenoma.  Patient reports he is doing much better.  He has noted significant improvement in  his cough.  He has not had any bad episode since his evaluation by speech pathologist.  He denies dysphagia.  He has partial dentures.  He states he is able to chew food well and is a slow eater.  He continues to complain of heartburn 2-3 times a day for which he is using Tums with quick relief.  He denies nausea vomiting or abdominal pain.  Bowels move daily.  He also denies melena or rectal bleeding. He says he uses albuterol inhaler daily and uses neb therapy every now and then.  He remains on Valium 3 times a day.   Observations/Objective:  Patient weighed 231 pounds on 11/02/2018. Recent weight not available.   Assessment and Plan:  #1.  Oropharyngeal dysphagia.  He has undergone evaluation by speech pathologist.  Evaluation revealed oropharyngeal dysphagia and he was advised compensatory strategies as well as pharyngeal strengthening exercises and these seem to have helped him a great deal.  He has not been treated for bronchitis or pneumonia.  He remains in risk and should continue with pharyngeal exercises as recommended.  #2.  Chronic GERD.  Barium study did not reveal mucosal abnormality to suggest esophagitis stricture or hiatal hernia.  He is on 80 mg of omeprazole daily and still having heartburn.  He needs to pursue dietary measures more aggressively.  Will continue with current dose of omeprazole until next office visit in 6 months.  He is on 40 mg twice daily now.  #3.  History of colonic adenoma.  Last colonoscopy was in July 2017 with removal of 3 small polyps only one was a tubular adenoma.  Therefore he could wait until July  2024 for his next exam.  Follow Up Instructions:  Patient will continue omeprazole at 40 mg p.o. twice daily. Dietary measures and try to avoid foods which trigger heartburn. Office visit in 6 months.  I discussed the assessment and treatment plan with the patient. The patient was provided an opportunity to ask questions and all were answered. The  patient agreed with the plan and demonstrated an understanding of the instructions.   The patient was advised to call back or seek an in-person evaluation if the symptoms worsen or if the condition fails to improve as anticipated.  I provided 7 minutes of non-face-to-face time during this encounter.   Brent December, MD

## 2019-02-06 NOTE — Patient Instructions (Signed)
Patient will return for office visit in 6 months at which time we will plan to back off on PPI dose if possible.

## 2019-02-14 ENCOUNTER — Institutional Professional Consult (permissible substitution): Payer: Medicaid Other | Admitting: Pulmonary Disease

## 2019-02-23 ENCOUNTER — Institutional Professional Consult (permissible substitution): Payer: Medicaid Other | Admitting: Pulmonary Disease

## 2019-03-21 ENCOUNTER — Other Ambulatory Visit (INDEPENDENT_AMBULATORY_CARE_PROVIDER_SITE_OTHER): Payer: Self-pay | Admitting: Gastroenterology

## 2019-03-21 DIAGNOSIS — K219 Gastro-esophageal reflux disease without esophagitis: Secondary | ICD-10-CM

## 2019-03-21 MED ORDER — OMEPRAZOLE 40 MG PO CPDR: 40.0000 mg | DELAYED_RELEASE_CAPSULE | Freq: Two times a day (BID) | ORAL | 5 refills | Status: DC

## 2019-03-21 NOTE — Progress Notes (Signed)
Refill sent to pharmacy.   

## 2019-03-23 ENCOUNTER — Other Ambulatory Visit: Payer: Self-pay

## 2019-03-23 ENCOUNTER — Telehealth: Payer: Self-pay | Admitting: Neurology

## 2019-03-23 MED ORDER — GABAPENTIN 300 MG PO CAPS: ORAL_CAPSULE | ORAL | 0 refills | Status: DC

## 2019-03-23 NOTE — Telephone Encounter (Signed)
Patient left msg with after hours about being out of his gabapentin medication. He took his last pill/dosage last night. Thanks!

## 2019-03-23 NOTE — Telephone Encounter (Signed)
Please advise on refill, no showed last appointment and no future appointment scheduled

## 2019-03-23 NOTE — Telephone Encounter (Signed)
Patient called per Dr. Karel Jarvis and scheduled for 04/14/19 at 3:30 for a telephone visit, he does not have Internet. Dr. Karel Jarvis said she will send in one refill until the appointment. Patient aware.

## 2019-03-23 NOTE — Telephone Encounter (Signed)
Patient called to be sure we received the request for more gabapentin 300 MG.  [name] in [place]

## 2019-04-14 ENCOUNTER — Other Ambulatory Visit: Payer: Self-pay

## 2019-04-14 ENCOUNTER — Telehealth (INDEPENDENT_AMBULATORY_CARE_PROVIDER_SITE_OTHER): Payer: Medicaid Other | Admitting: Neurology

## 2019-04-14 DIAGNOSIS — G629 Polyneuropathy, unspecified: Secondary | ICD-10-CM | POA: Diagnosis not present

## 2019-04-14 DIAGNOSIS — E0842 Diabetes mellitus due to underlying condition with diabetic polyneuropathy: Secondary | ICD-10-CM

## 2019-04-14 DIAGNOSIS — R569 Unspecified convulsions: Secondary | ICD-10-CM | POA: Diagnosis not present

## 2019-04-14 DIAGNOSIS — G40219 Localization-related (focal) (partial) symptomatic epilepsy and epileptic syndromes with complex partial seizures, intractable, without status epilepticus: Secondary | ICD-10-CM
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
## 2019-05-01 MED ORDER — CARBAMAZEPINE ER 400 MG PO TB12: 400.0000 mg | ORAL_TABLET | Freq: Two times a day (BID) | ORAL | 3 refills | Status: DC

## 2019-05-01 MED ORDER — GABAPENTIN 300 MG PO CAPS: ORAL_CAPSULE | ORAL | 11 refills | Status: DC

## 2019-06-06 ENCOUNTER — Institutional Professional Consult (permissible substitution): Payer: Medicaid Other | Admitting: Pulmonary Disease

## 2019-07-06 ENCOUNTER — Institutional Professional Consult (permissible substitution): Payer: Medicaid Other | Admitting: Pulmonary Disease

## 2019-07-18 ENCOUNTER — Other Ambulatory Visit: Payer: Self-pay

## 2019-07-18 ENCOUNTER — Encounter: Payer: Self-pay | Admitting: Internal Medicine

## 2019-07-18 ENCOUNTER — Ambulatory Visit (INDEPENDENT_AMBULATORY_CARE_PROVIDER_SITE_OTHER): Payer: Medicaid Other

## 2019-07-18 ENCOUNTER — Ambulatory Visit: Payer: Medicaid Other | Admitting: Internal Medicine

## 2019-07-18 VITALS — BP 112/62 | HR 104 | Temp 98.6°F | Ht 74.0 in | Wt 216.2 lb

## 2019-07-18 DIAGNOSIS — J449 Chronic obstructive pulmonary disease, unspecified: Secondary | ICD-10-CM | POA: Diagnosis not present

## 2019-07-18 DIAGNOSIS — R569 Unspecified convulsions: Secondary | ICD-10-CM | POA: Diagnosis not present

## 2019-07-18 DIAGNOSIS — Z720 Tobacco use: Secondary | ICD-10-CM

## 2019-07-18 DIAGNOSIS — G4733 Obstructive sleep apnea (adult) (pediatric): Secondary | ICD-10-CM

## 2019-07-18 IMAGING — DX DG CHEST 2V
2 series · 2 of 2 positions shown · non-contrast
Comparison: [DATE]

CLINICAL DATA: COPD

EXAM:
CHEST - 2 VIEW

[chest pa]
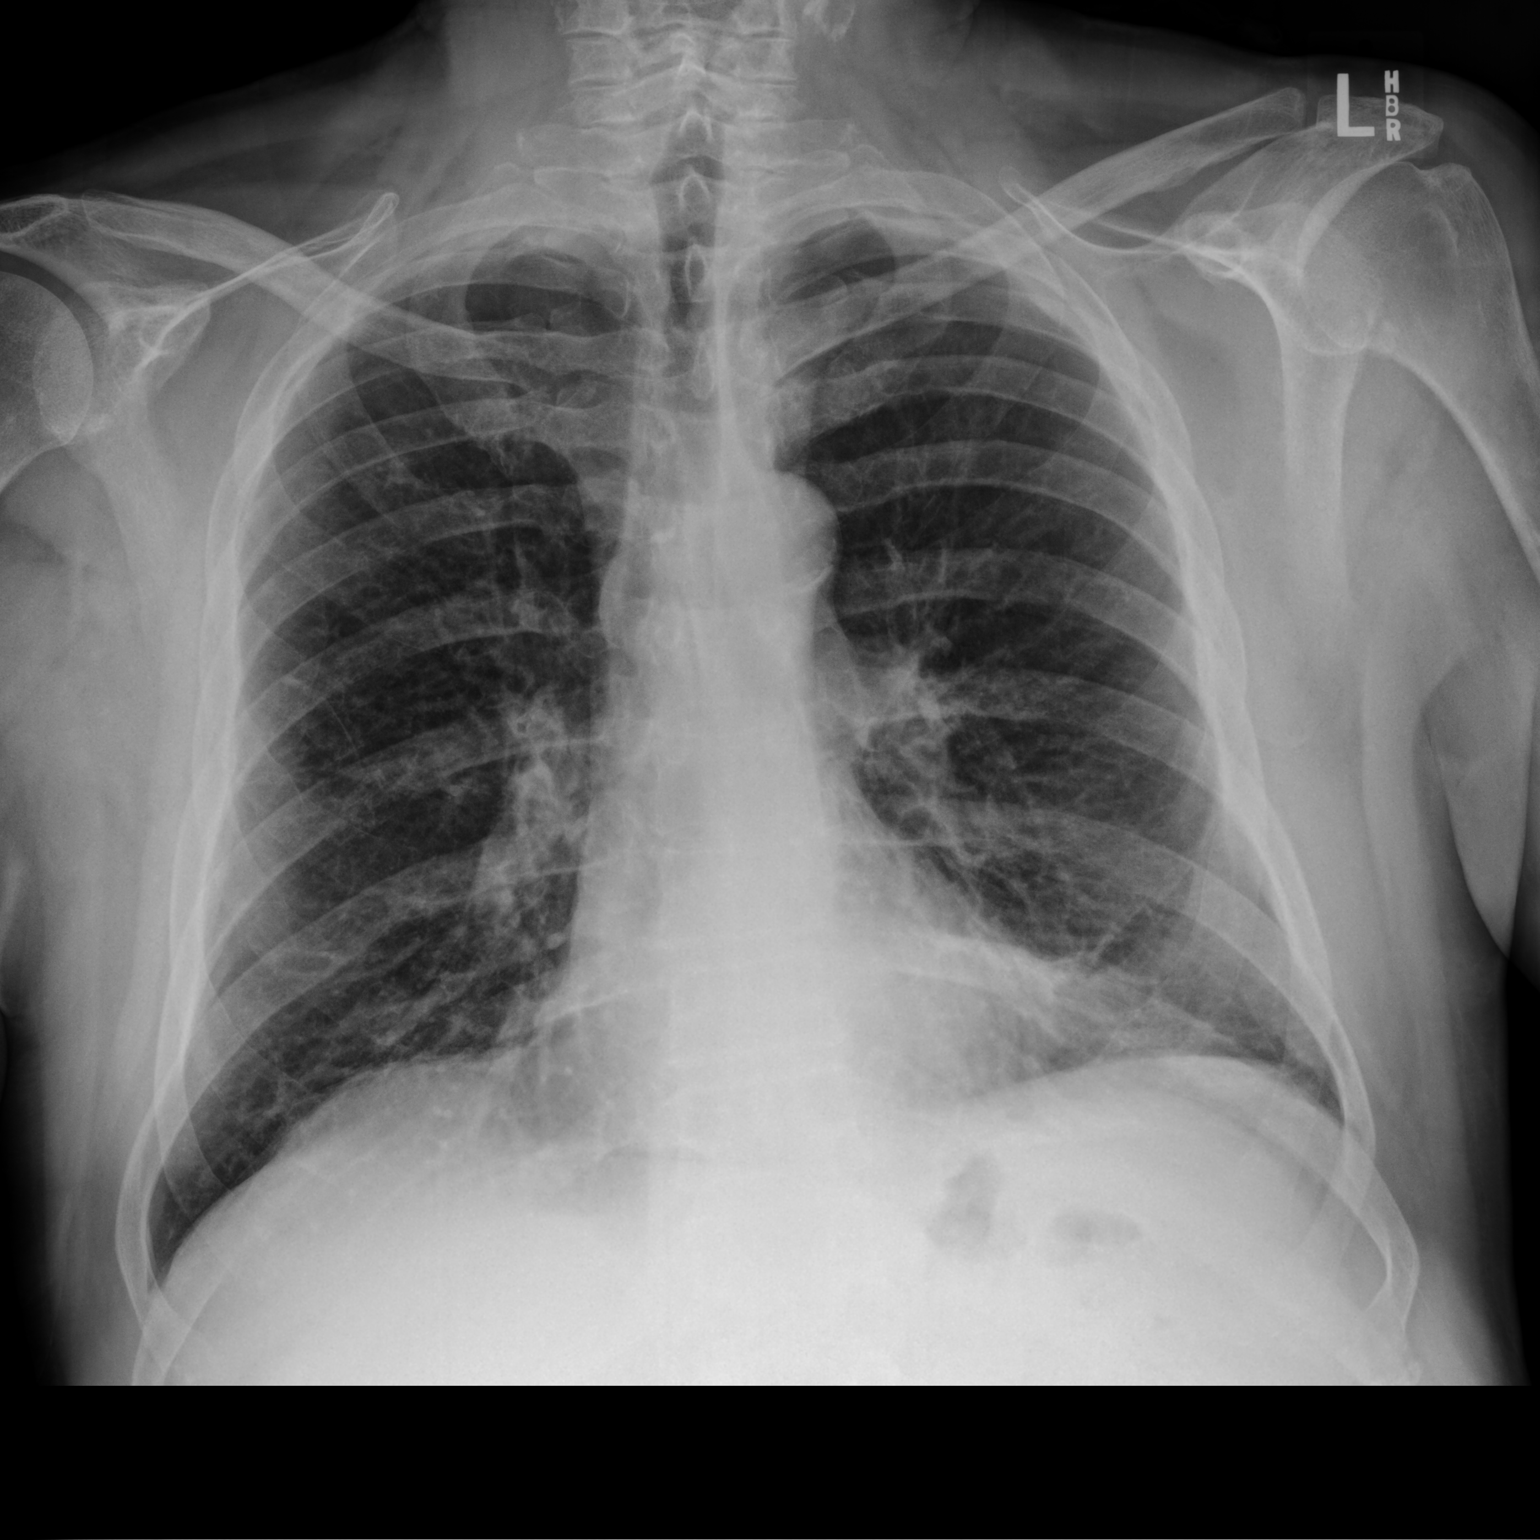

[chest lat]
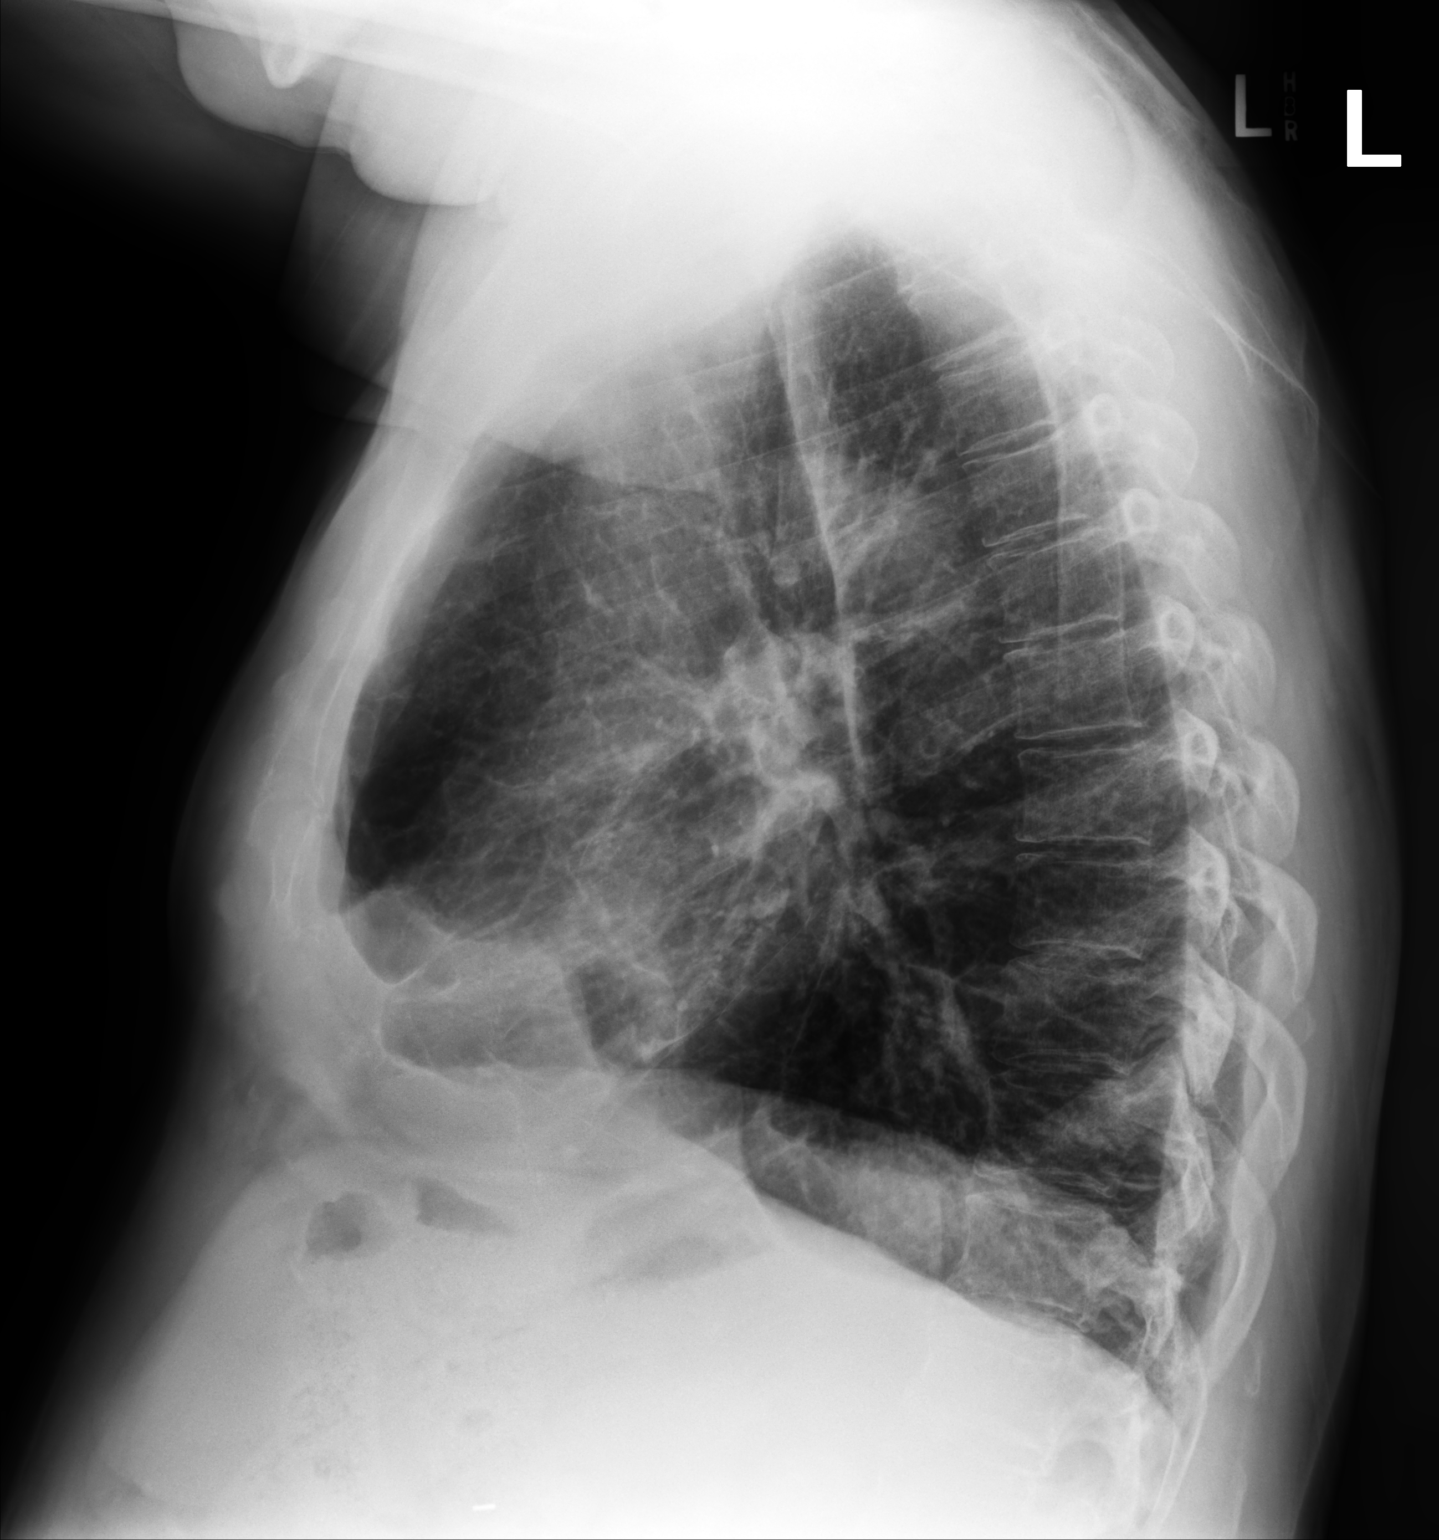

[2 of 2 positions shown; findings below may reference images not displayed]

FINDINGS: Stable cardiomediastinal contours. Atherosclerotic calcification of
the aortic knob. Previously seen opacity within the left upper lobe
has resolved. No new focal airspace consolidation. No pleural
effusion or pneumothorax. Included osseous structures appear intact.
IMPRESSION: Interval resolution of the left upper lobe opacity.

## 2019-07-18 MED ORDER — BREZTRI AEROSPHERE 160-9-4.8 MCG/ACT IN AERO: 2.0000 | INHALATION_SPRAY | Freq: Two times a day (BID) | RESPIRATORY_TRACT | 0 refills | Status: DC

## 2019-07-18 NOTE — Assessment & Plan Note (Signed)
I offered help from our Pharmacy team smoking cessation program, but he declined at this time.

## 2019-07-18 NOTE — Assessment & Plan Note (Signed)
Long-time smoker with chronic bronchitis symptoms. Plan- Try samples of Breztri for comparison with Symbicort as discussed. CXR, PFT.
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
## 2019-07-18 NOTE — Patient Instructions (Signed)
Order- schedule Split protocol sleep study with seizure montage     Dx OSA, seizure disorder  Order- schedule PFT    Dx COPD mixed type  Order- CXR   Dx COPD mixed type  Samples x 3 Breztri inhaler     Inhale 2 puffs, then rinse mouth, twice daily every day Try this instead of Symbicort. If it works better, let us know. Otherwise, when you run out go back to Symbicort.  Please call as needed

## 2019-07-18 NOTE — Assessment & Plan Note (Signed)
Need to update documentation. With seizure history, we will need attended, in-center split night study. I am not clear if valium helped raise seizure threshold or contributed to daytime sleepiness. Hopefully he can stay off without withdrawal while we clarify his sleep issues.

## 2019-07-19 ENCOUNTER — Other Ambulatory Visit (HOSPITAL_BASED_OUTPATIENT_CLINIC_OR_DEPARTMENT_OTHER): Payer: Self-pay

## 2019-07-19 NOTE — Progress Notes (Signed)
Spoke with the pt's significant other ok per DPR and notified her of results and she verbalized understanding and will inform the pt

## 2019-07-28 ENCOUNTER — Other Ambulatory Visit (HOSPITAL_COMMUNITY): Payer: Medicaid Other

## 2019-08-07 ENCOUNTER — Ambulatory Visit (INDEPENDENT_AMBULATORY_CARE_PROVIDER_SITE_OTHER): Payer: Medicaid Other | Admitting: Internal Medicine

## 2019-08-08 ENCOUNTER — Ambulatory Visit (INDEPENDENT_AMBULATORY_CARE_PROVIDER_SITE_OTHER): Payer: Medicaid Other | Admitting: Internal Medicine

## 2019-08-11 ENCOUNTER — Other Ambulatory Visit: Payer: Self-pay

## 2019-08-11 ENCOUNTER — Other Ambulatory Visit (HOSPITAL_COMMUNITY)
Admission: RE | Admit: 2019-08-11 | Discharge: 2019-08-11 | Disposition: A | Discharge status: Home / Self Care | Payer: Medicaid Other | Source: Ambulatory Visit | Attending: Internal Medicine | Admitting: Internal Medicine

## 2019-08-11 DIAGNOSIS — Z20822 Contact with and (suspected) exposure to covid-19: Secondary | ICD-10-CM | POA: Insufficient documentation

## 2019-08-11 DIAGNOSIS — Z01812 Encounter for preprocedural laboratory examination: Secondary | ICD-10-CM | POA: Diagnosis present

## 2019-08-12 LAB — SARS CORONAVIRUS 2 (TAT 6-24 HRS): SARS Coronavirus 2: NEGATIVE

## 2019-08-14 ENCOUNTER — Ambulatory Visit (HOSPITAL_BASED_OUTPATIENT_CLINIC_OR_DEPARTMENT_OTHER): Payer: Medicaid Other | Attending: Internal Medicine | Admitting: Internal Medicine

## 2019-08-14 ENCOUNTER — Other Ambulatory Visit: Payer: Self-pay

## 2019-08-14 VITALS — Ht 73.0 in | Wt 210.0 lb

## 2019-08-14 DIAGNOSIS — I10 Essential (primary) hypertension: Secondary | ICD-10-CM | POA: Insufficient documentation

## 2019-08-14 DIAGNOSIS — E119 Type 2 diabetes mellitus without complications: Secondary | ICD-10-CM | POA: Insufficient documentation

## 2019-08-14 DIAGNOSIS — G4733 Obstructive sleep apnea (adult) (pediatric): Secondary | ICD-10-CM

## 2019-08-14 DIAGNOSIS — R0683 Snoring: Secondary | ICD-10-CM | POA: Diagnosis present

## 2019-08-14 DIAGNOSIS — R5383 Other fatigue: Secondary | ICD-10-CM | POA: Insufficient documentation

## 2019-08-14 DIAGNOSIS — R569 Unspecified convulsions: Secondary | ICD-10-CM

## 2019-08-27 DIAGNOSIS — R569 Unspecified convulsions: Secondary | ICD-10-CM | POA: Diagnosis not present

## 2019-08-27 NOTE — Procedures (Signed)
Patient Name: Brent [name], Brent [name] Date: 08/14/2019 Gender: Male D.O.B: 07/13/1965 Age (years): 66 Referring Provider: Jetty Duhamel MD, ABSM Height (inches): 73 Interpreting Physician: Jetty Duhamel MD, ABSM Weight (lbs): 210 RPSGT: Ulyess Mort BMI: 28 MRN: [id number] Neck Size: 17.50  CLINICAL INFORMATION Sleep Study Type: NPSG with seizure montage Indication for sleep study: Diabetes, Fatigue, Hypertension, OSA, Snoring, history of seizure Epworth Sleepiness Score:  SLEEP STUDY TECHNIQUE As per the AASM Manual for the Scoring of Sleep and Associated Events v2.3 (April 2016) with a hypopnea requiring 4% desaturations.  The channels recorded and monitored were frontal, central and occipital EEG, electrooculogram (EOG), submentalis EMG (chin), nasal and oral airflow, thoracic and abdominal wall motion, anterior tibialis EMG, snore microphone, electrocardiogram, and pulse oximetry.  MEDICATIONS Medications self-administered by patient taken the night of the study : ATORVASTATIN, TEGRETOL XR, GABAPENTIN, METFORMIN  SLEEP ARCHITECTURE The study was initiated at 10:49:30 PM and ended at 4:56:26 AM.  Sleep onset time was 23.6 minutes and the sleep efficiency was 80.1%%. The total sleep time was 293.8 minutes.  Stage REM latency was 160.0 minutes.  The patient spent 15.0%% of the night in stage N1 sleep, 73.3%% in stage N2 sleep, 0.5%% in stage N3 and 11.2% in REM.  Alpha intrusion was absent.  Supine sleep was 0.00%.  RESPIRATORY PARAMETERS The overall apnea/hypopnea index (AHI) was 7.8 per hour. There were 0 total apneas, including 0 obstructive, 0 central and 0 mixed apneas. There were 38 hypopneas and 75 RERAs.  The AHI during Stage REM sleep was 5.5 per hour.  AHI while supine was N/A per hour.  The mean oxygen saturation was 91.3%. The minimum SpO2 during sleep was 86.0%.  moderate snoring was noted during this study.  CARDIAC DATA The 2 lead EKG  demonstrated sinus rhythm. The mean heart rate was 77.2 beats per minute. Other EKG findings include: None.  LEG MOVEMENT DATA The total PLMS were 0 with a resulting PLMS index of 0.0. Associated arousal with leg movement index was 5.1 .  IMPRESSIONS - Mild obstructive sleep apnea occurred during this study (AHI = 7.8/h). - No significant central sleep apnea occurred during this study (CAI = 0.0/h). - Oxygen desaturation was noted during this study (Min O2 = 86.0%).  - The technician added supplemental O2, 1L/ min, at 12:00 MN per protocol. Mean sat on O2 91.3%. - The patient snored with moderate snoring volume. - No cardiac abnormalities were noted during this study. - Total Limb Movements 72 (14.7/ hr).Total Limb Movements with Arousal 25 ( 5.1/ hr).  DIAGNOSIS - Obstructive Sleep Apnea (G47.33) - Periodic Limb Movement During Sleep (G47.61) - Nocturnal Hypoxemia (G47.3)  RECOMMENDATIONS - Mild obstructive sleep apnea. Because of the associated hypoxemia, consider Autopap or CPAP titration sleep study. - Mild Limb Movement sleep disturbance, may not need to be treated initially. - Be careful with alcohol, sedatives and other CNS depressants that may worsen sleep apnea and disrupt normal sleep architecture. - Sleep hygiene should be reviewed to assess factors that may improve sleep quality. - Weight management and regular exercise should be initiated or continued if appropriate.  [Electronically signed] 08/27/2019 10:58 AM  Jetty Duhamel MD, ABSM Diplomate, American Board of Sleep Medicine   NPI: [id number]                          Jetty Duhamel Diplomate, American Board of Sleep Medicine  ELECTRONICALLY SIGNED ON:  08/27/2019, 10:45 AM [facility] PH: [phone]   FX: [phone] [place]

## 2019-09-01 ENCOUNTER — Other Ambulatory Visit: Payer: Self-pay

## 2019-09-01 DIAGNOSIS — G4733 Obstructive sleep apnea (adult) (pediatric): Secondary | ICD-10-CM

## 2019-09-07 ENCOUNTER — Telehealth: Payer: Self-pay | Admitting: Internal Medicine

## 2019-09-07 NOTE — Telephone Encounter (Signed)
Received call from [facility] about upcoming cpap titration study.  Brent [name] wants to know if up coming study 10/05/19 needs seizure montage.   Message routed to Dr Maple Hudson to advise

## 2019-09-08 NOTE — Telephone Encounter (Signed)
Called and left detailed message for [facility] at the sleep lab with Dr. Roxy Cedar message.

## 2019-09-08 NOTE — Telephone Encounter (Signed)
Yes- please include seizure montage

## 2019-09-18 DIAGNOSIS — M7541 Impingement syndrome of right shoulder: Secondary | ICD-10-CM | POA: Insufficient documentation

## 2019-10-09 ENCOUNTER — Ambulatory Visit (HOSPITAL_BASED_OUTPATIENT_CLINIC_OR_DEPARTMENT_OTHER): Payer: Medicaid Other | Attending: Internal Medicine | Admitting: Internal Medicine

## 2019-10-09 ENCOUNTER — Telehealth: Payer: Self-pay | Admitting: Internal Medicine

## 2019-10-09 NOTE — Telephone Encounter (Signed)
Spoke with Brent [name], patient girlfriend, listed on DPR.  She was asking about rescheduling his PFT for 9/2.  She stated he had allergies or a "cold".  She denied him having any fever, loss of taste or smell, cough.  Advised he should be fine to have his test done.  Advised her to call the office if he begins to have any of the above symptoms.  She was asking about him being covid tested prior to the PFT.  I advised her that as long as he brings his covid vaccination card to his appointment he does not have to be covid tested since he is having his test ion the office.  She verbalized understanding.  Nothing further needed.

## 2019-10-16 ENCOUNTER — Other Ambulatory Visit (HOSPITAL_COMMUNITY): Payer: Medicaid Other

## 2019-10-19 ENCOUNTER — Ambulatory Visit: Payer: Medicaid Other | Admitting: Internal Medicine

## 2019-10-19 ENCOUNTER — Other Ambulatory Visit: Payer: Self-pay

## 2019-10-31 DIAGNOSIS — S2239XA Fracture of one rib, unspecified side, initial encounter for closed fracture: Secondary | ICD-10-CM | POA: Insufficient documentation

## 2019-11-03 ENCOUNTER — Emergency Department (HOSPITAL_COMMUNITY): Payer: Medicaid Other

## 2019-11-03 ENCOUNTER — Inpatient Hospital Stay (HOSPITAL_COMMUNITY)
Admission: EM | Admit: 2019-11-03 | Discharge: 2019-11-08 | DRG: 193 | Disposition: A | Discharge status: Home / Self Care | Payer: Medicaid Other | Attending: Internal Medicine | Admitting: Internal Medicine

## 2019-11-03 ENCOUNTER — Encounter (HOSPITAL_COMMUNITY): Payer: Self-pay | Admitting: *Deleted

## 2019-11-03 ENCOUNTER — Other Ambulatory Visit: Payer: Self-pay

## 2019-11-03 DIAGNOSIS — E1169 Type 2 diabetes mellitus with other specified complication: Secondary | ICD-10-CM | POA: Diagnosis present

## 2019-11-03 DIAGNOSIS — W1830XA Fall on same level, unspecified, initial encounter: Secondary | ICD-10-CM | POA: Diagnosis present

## 2019-11-03 DIAGNOSIS — J441 Chronic obstructive pulmonary disease with (acute) exacerbation: Secondary | ICD-10-CM

## 2019-11-03 DIAGNOSIS — J189 Pneumonia, unspecified organism: Principal | ICD-10-CM

## 2019-11-03 DIAGNOSIS — J9621 Acute and chronic respiratory failure with hypoxia: Secondary | ICD-10-CM

## 2019-11-03 DIAGNOSIS — E1140 Type 2 diabetes mellitus with diabetic neuropathy, unspecified: Secondary | ICD-10-CM | POA: Diagnosis present

## 2019-11-03 DIAGNOSIS — Z9981 Dependence on supplemental oxygen: Secondary | ICD-10-CM | POA: Diagnosis not present

## 2019-11-03 DIAGNOSIS — K7460 Unspecified cirrhosis of liver: Secondary | ICD-10-CM | POA: Diagnosis present

## 2019-11-03 DIAGNOSIS — Z720 Tobacco use: Secondary | ICD-10-CM | POA: Diagnosis present

## 2019-11-03 DIAGNOSIS — IMO0002 Reserved for concepts with insufficient information to code with codable children: Secondary | ICD-10-CM

## 2019-11-03 DIAGNOSIS — E876 Hypokalemia: Secondary | ICD-10-CM | POA: Diagnosis present

## 2019-11-03 DIAGNOSIS — R0602 Shortness of breath: Secondary | ICD-10-CM | POA: Diagnosis present

## 2019-11-03 DIAGNOSIS — Z7951 Long term (current) use of inhaled steroids: Secondary | ICD-10-CM

## 2019-11-03 DIAGNOSIS — Z79899 Other long term (current) drug therapy: Secondary | ICD-10-CM | POA: Diagnosis not present

## 2019-11-03 DIAGNOSIS — Z794 Long term (current) use of insulin: Secondary | ICD-10-CM

## 2019-11-03 DIAGNOSIS — G40909 Epilepsy, unspecified, not intractable, without status epilepticus: Secondary | ICD-10-CM | POA: Diagnosis present

## 2019-11-03 DIAGNOSIS — I10 Essential (primary) hypertension: Secondary | ICD-10-CM | POA: Diagnosis present

## 2019-11-03 DIAGNOSIS — E785 Hyperlipidemia, unspecified: Secondary | ICD-10-CM

## 2019-11-03 DIAGNOSIS — E118 Type 2 diabetes mellitus with unspecified complications: Secondary | ICD-10-CM

## 2019-11-03 DIAGNOSIS — B9789 Other viral agents as the cause of diseases classified elsewhere: Secondary | ICD-10-CM | POA: Diagnosis present

## 2019-11-03 DIAGNOSIS — S2242XA Multiple fractures of ribs, left side, initial encounter for closed fracture: Secondary | ICD-10-CM | POA: Diagnosis present

## 2019-11-03 DIAGNOSIS — Z20822 Contact with and (suspected) exposure to covid-19: Secondary | ICD-10-CM | POA: Diagnosis present

## 2019-11-03 DIAGNOSIS — E871 Hypo-osmolality and hyponatremia: Secondary | ICD-10-CM | POA: Diagnosis present

## 2019-11-03 DIAGNOSIS — F329 Major depressive disorder, single episode, unspecified: Secondary | ICD-10-CM | POA: Diagnosis present

## 2019-11-03 DIAGNOSIS — I2510 Atherosclerotic heart disease of native coronary artery without angina pectoris: Secondary | ICD-10-CM | POA: Diagnosis present

## 2019-11-03 DIAGNOSIS — K219 Gastro-esophageal reflux disease without esophagitis: Secondary | ICD-10-CM

## 2019-11-03 DIAGNOSIS — J440 Chronic obstructive pulmonary disease with acute lower respiratory infection: Secondary | ICD-10-CM | POA: Diagnosis present

## 2019-11-03 DIAGNOSIS — K766 Portal hypertension: Secondary | ICD-10-CM | POA: Diagnosis present

## 2019-11-03 DIAGNOSIS — B348 Other viral infections of unspecified site: Secondary | ICD-10-CM | POA: Diagnosis present

## 2019-11-03 DIAGNOSIS — E782 Mixed hyperlipidemia: Secondary | ICD-10-CM | POA: Diagnosis present

## 2019-11-03 DIAGNOSIS — F17210 Nicotine dependence, cigarettes, uncomplicated: Secondary | ICD-10-CM | POA: Diagnosis present

## 2019-11-03 DIAGNOSIS — E1165 Type 2 diabetes mellitus with hyperglycemia: Secondary | ICD-10-CM

## 2019-11-03 DIAGNOSIS — R569 Unspecified convulsions: Secondary | ICD-10-CM

## 2019-11-03 LAB — BASIC METABOLIC PANEL
Anion gap: 9 (ref 5–15)
BUN: 20 mg/dL (ref 6–20)
CO2: 26 mmol/L (ref 22–32)
Calcium: 9 mg/dL (ref 8.9–10.3)
Chloride: 102 mmol/L (ref 98–111)
Creatinine, Ser: 0.87 mg/dL (ref 0.61–1.24)
GFR calc Af Amer: >60 mL/min (ref >60)
GFR calc non Af Amer: >60 mL/min (ref >60)
Glucose, Bld: 147 mg/dL — ABNORMAL HIGH (ref 70–99)
Potassium: 3.8 mmol/L (ref 3.5–5.1)
Sodium: 137 mmol/L (ref 135–145)

## 2019-11-03 LAB — CBC WITH DIFFERENTIAL/PLATELET
Abs Immature Granulocytes: 0.15 10*3/uL — ABNORMAL HIGH (ref 0.00–0.07)
Basophils Absolute: 0.1 10*3/uL (ref 0.0–0.1)
Basophils Relative: 1 %
Eosinophils Absolute: 0 10*3/uL (ref 0.0–0.5)
Eosinophils Relative: 0 %
HCT: 44.3 % (ref 39.0–52.0)
Hemoglobin: 14.7 g/dL (ref 13.0–17.0)
Immature Granulocytes: 1 %
Lymphocytes Relative: 4 %
Lymphs Abs: 0.7 10*3/uL (ref 0.7–4.0)
MCH: 33.9 pg (ref 26.0–34.0)
MCHC: 33.2 g/dL (ref 30.0–36.0)
MCV: 102.3 fL — ABNORMAL HIGH (ref 80.0–100.0)
Monocytes Absolute: 1.2 10*3/uL — ABNORMAL HIGH (ref 0.1–1.0)
Monocytes Relative: 7 %
Neutro Abs: 14.3 10*3/uL — ABNORMAL HIGH (ref 1.7–7.7)
Neutrophils Relative %: 87 %
Platelets: 285 10*3/uL (ref 150–400)
RBC: 4.33 MIL/uL (ref 4.22–5.81)
RDW: 13.2 % (ref 11.5–15.5)
WBC: 16.5 10*3/uL — ABNORMAL HIGH (ref 4.0–10.5)
nRBC: 0 % (ref 0.0–0.2)

## 2019-11-03 LAB — RESPIRATORY PANEL BY PCR

## 2019-11-03 LAB — HEMOGLOBIN A1C
Hgb A1c MFr Bld: 6.7 % — ABNORMAL HIGH (ref 4.8–5.6)
Mean Plasma Glucose: 145.59 mg/dL

## 2019-11-03 LAB — LACTIC ACID, PLASMA: Lactic Acid, Venous: 0.9 mmol/L (ref 0.5–1.9)

## 2019-11-03 LAB — SARS CORONAVIRUS 2 BY RT PCR (HOSPITAL ORDER, PERFORMED IN ~~LOC~~ HOSPITAL LAB): SARS Coronavirus 2: NEGATIVE

## 2019-11-03 LAB — BRAIN NATRIURETIC PEPTIDE: B Natriuretic Peptide: 107 pg/mL — ABNORMAL HIGH (ref 0.0–100.0)

## 2019-11-03 LAB — BLOOD GAS, ARTERIAL
Acid-Base Excess: 2.8 mmol/L — ABNORMAL HIGH (ref 0.0–2.0)
Bicarbonate: 26.3 mmol/L (ref 20.0–28.0)
FIO2: 40
O2 Saturation: 92.6 %
Patient temperature: 37
pCO2 arterial: 46.7 mmHg (ref 32.0–48.0)
pH, Arterial: 7.387 (ref 7.350–7.450)
pO2, Arterial: 65.9 mmHg — ABNORMAL LOW (ref 83.0–108.0)

## 2019-11-03 LAB — CBG MONITORING, ED
Glucose-Capillary: 147 mg/dL — ABNORMAL HIGH (ref 70–99)
Glucose-Capillary: 254 mg/dL — ABNORMAL HIGH (ref 70–99)

## 2019-11-03 LAB — GLUCOSE, CAPILLARY: Glucose-Capillary: 206 mg/dL — ABNORMAL HIGH (ref 70–99)

## 2019-11-03 LAB — HIV ANTIBODY (ROUTINE TESTING W REFLEX): HIV Screen 4th Generation wRfx: NONREACTIVE

## 2019-11-03 IMAGING — CT CT ANGIO CHEST
2 of 6 series · 18 of 46 positions shown · IV contrast (Omnipaque or Isovue)
Comparison: Chest radiography [DATE].  Chest CT [DATE]

CLINICAL DATA: Shortness of breath over the last week. Recent
pneumonia diagnosis. Assess for pulmonary emboli.

EXAM:
CT ANGIOGRAPHY CHEST WITH CONTRAST
TECHNIQUE: Multidetector CT imaging of the chest was performed using the
standard protocol during bolus administration of intravenous
contrast. Multiplanar CT image reconstructions and MIPs were
obtained to evaluate the vascular anatomy.
CONTRAST:  100mL OMNIPAQUE IOHEXOL 350 MG/ML SOLN

[Series 5: pe axial thins · axial · 0.82mm/px · z∈[+1085,+1357]mm · 15 of 298 slices shown]
[im 13/298  lung]
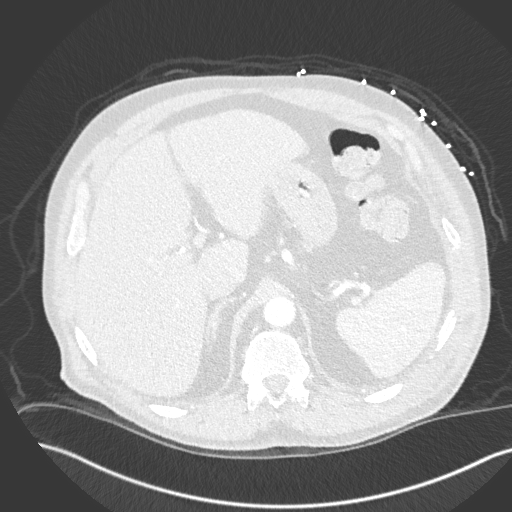
[im 39/298  soft-tissue]
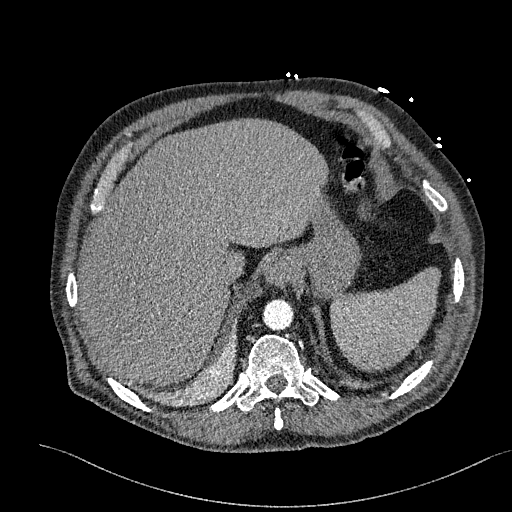
[im 52/298  lung]
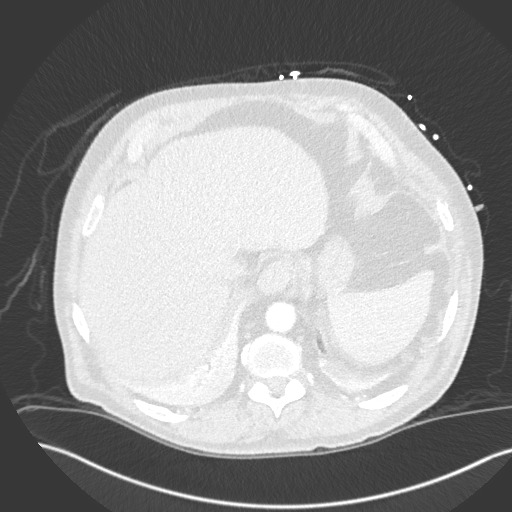
[im 78/298  soft-tissue]
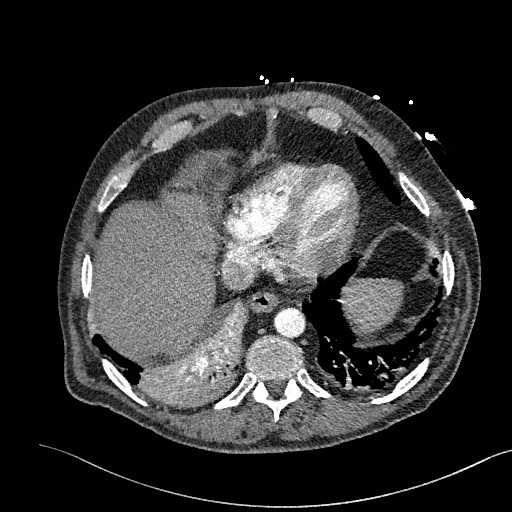
[im 91/298  lung]
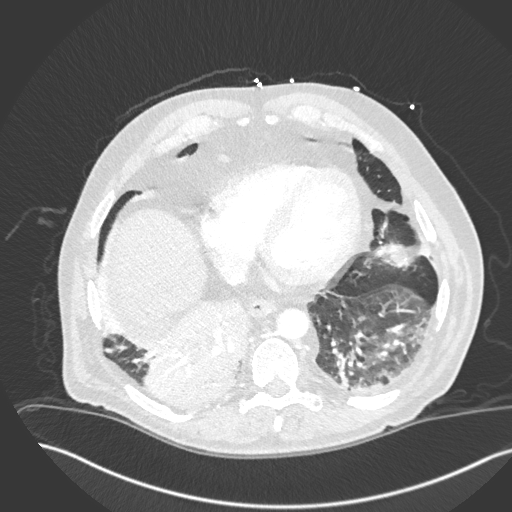
[im 117/298  soft-tissue]
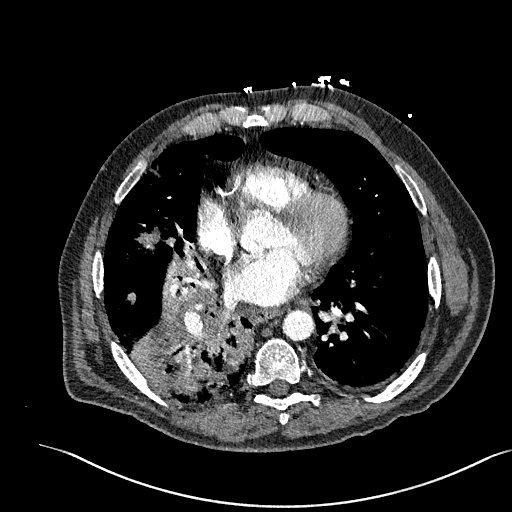
[im 130/298  lung]
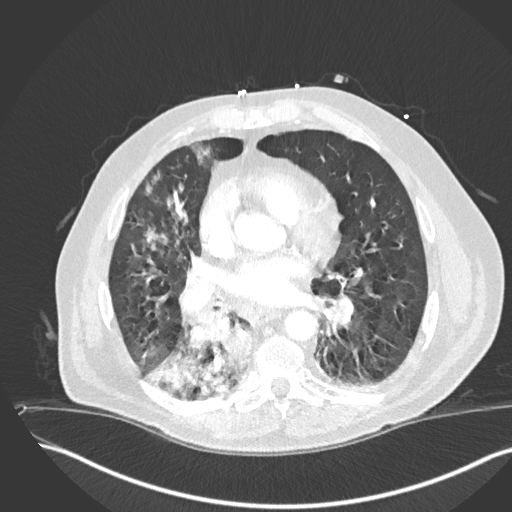
[im 155/298  soft-tissue]
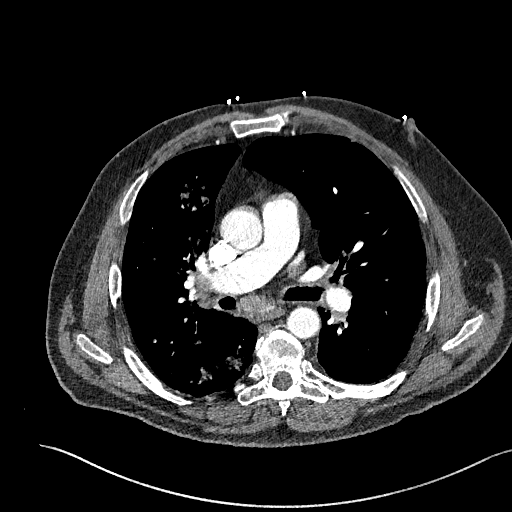
[im 168/298  lung]
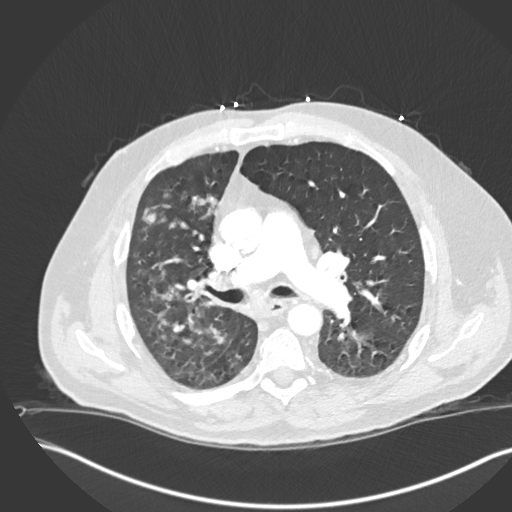
[im 181/298  soft-tissue]
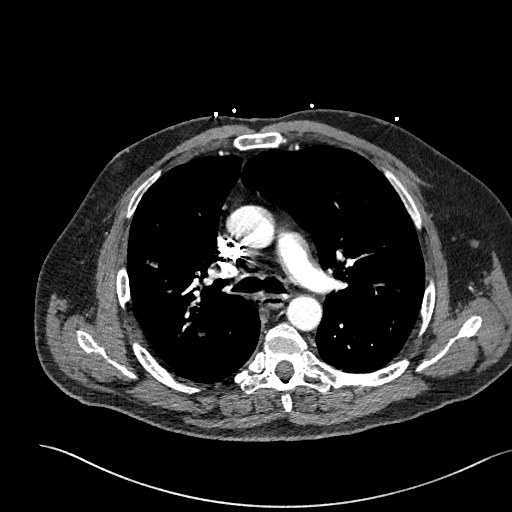
[im 207/298  lung]
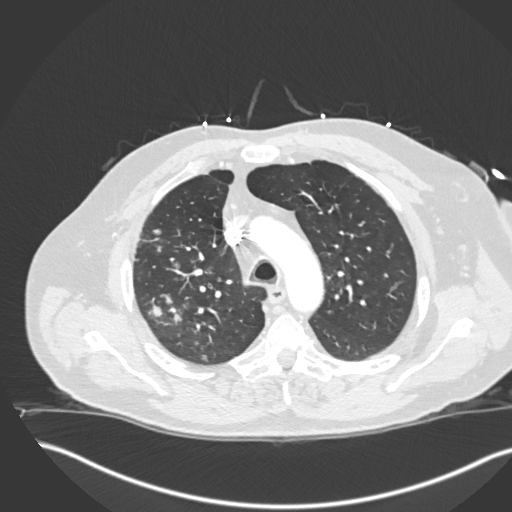
[im 220/298  soft-tissue]
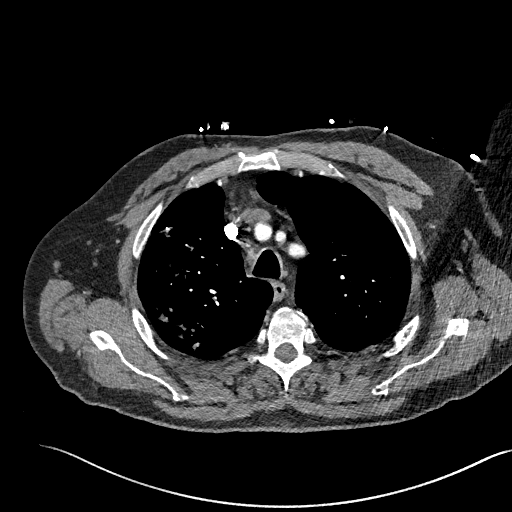
[im 246/298  lung]
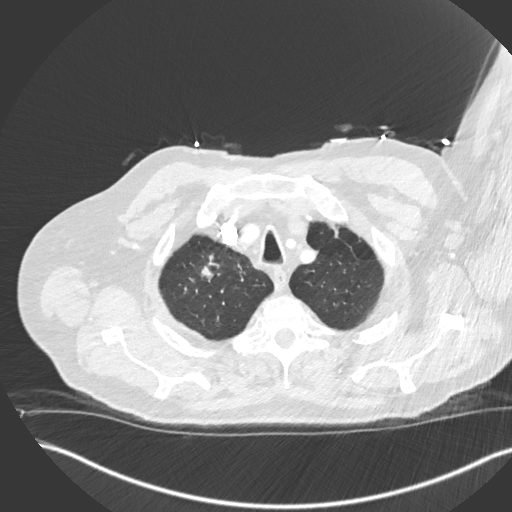
[im 259/298  soft-tissue]
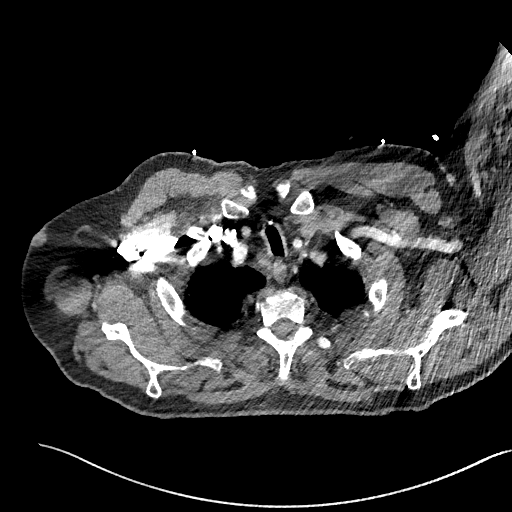
[im 285/298  lung]
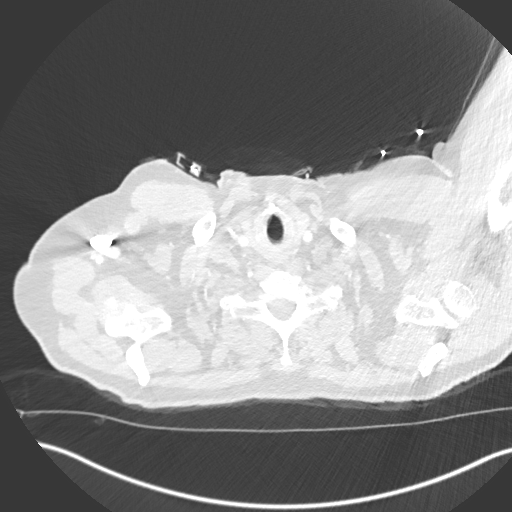

[Series 7: cor soft · coronal · 0.62mm/px · 3 of 160 slices shown]
[im 40/160  soft-tissue]
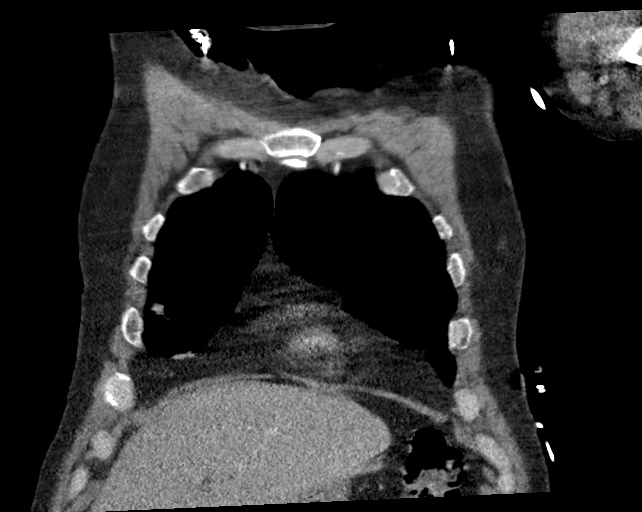
[im 80/160  soft-tissue]
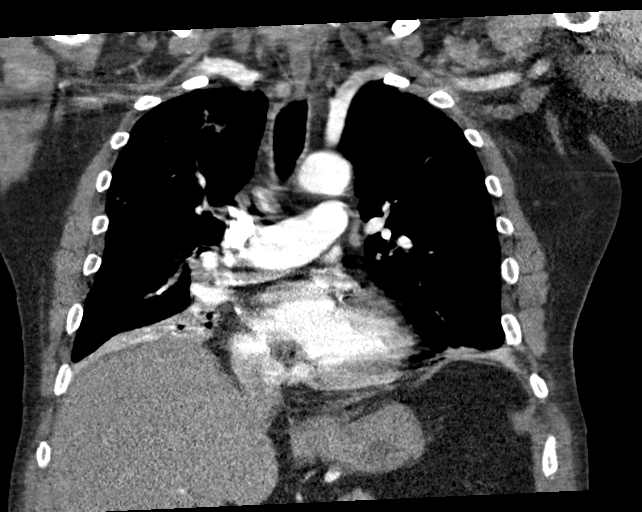
[im 120/160  soft-tissue]
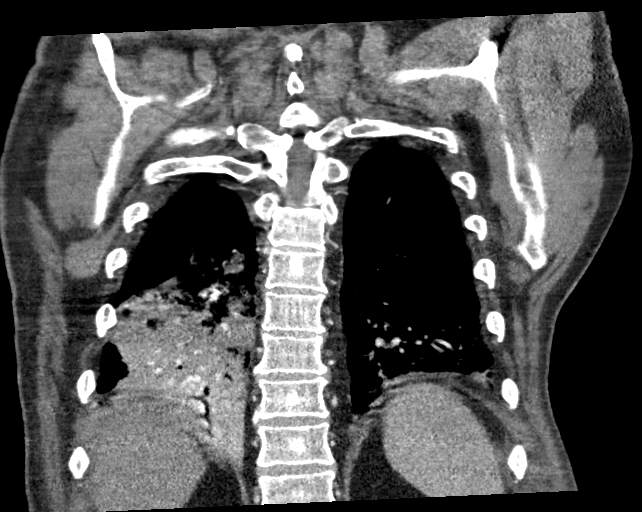

[18 of 46 positions shown; findings below may reference images not displayed]

FINDINGS: Cardiovascular: Pulmonary arterial opacification is good. There are
no pulmonary emboli. There is aortic atherosclerosis without
aneurysm or dissection. Coronary artery calcification is noted.
Heart size is normal. No pericardial fluid.

Mediastinum/Nodes: Interval enlargement of a subcarinal lymph node
probably reactive to pneumonia.

Lungs/Pleura: Background centrilobular emphysema particularly
notable in the left upper lobe. Development of right lower lobe
consolidation/collapse. Worsening of patchy infectious infiltrates
in the right upper lobe and right middle lobe. Left lung shows mild
atelectasis in the lower lobe, improved since the previous study.
Resolution of a few areas of patchy infiltrate in the left lung seen
5 days ago.

Upper Abdomen: Fatty change of the liver. No acute upper abdominal
finding.

Musculoskeletal: Redemonstration of recent fractures of the left
eighth, ninth and tenth ribs. No evidence of spinal injury.

Review of the MIP images confirms the above findings.
IMPRESSION: 1. No pulmonary emboli.
2. Development of right lower lobe consolidation/collapse. Worsening
of patchy infectious infiltrates in the right upper lobe and right
middle lobe.
3. Mild left base atelectasis.  Improved since the previous study.
4. Redemonstration of recent fractures of the left eighth, ninth and
tenth ribs.
5. Emphysema and aortic atherosclerosis.

Aortic Atherosclerosis ([RG]-[RG]) and Emphysema ([RG]-[RG]).

## 2019-11-03 MED ORDER — MORPHINE SULFATE (PF) 4 MG/ML IV SOLN
4.0000 mg | Freq: Once | INTRAVENOUS | Status: AC
  Administered 2019-11-03: 4 mg via INTRAVENOUS
  Filled 2019-11-03: qty 1

## 2019-11-03 MED ORDER — GABAPENTIN 400 MG PO CAPS
1200.0000 mg | ORAL_CAPSULE | Freq: Every day | ORAL | Status: DC
  Administered 2019-11-03 – 2019-11-07 (×5): 1200 mg via ORAL
  Filled 2019-11-03 (×5): qty 3

## 2019-11-03 MED ORDER — ARFORMOTEROL TARTRATE 15 MCG/2ML IN NEBU
15.0000 ug | INHALATION_SOLUTION | Freq: Two times a day (BID) | RESPIRATORY_TRACT | Status: DC
  Administered 2019-11-03 – 2019-11-08 (×11): 15 ug via RESPIRATORY_TRACT
  Filled 2019-11-03 (×11): qty 2

## 2019-11-03 MED ORDER — INSULIN DETEMIR 100 UNIT/ML ~~LOC~~ SOLN
22.0000 [IU] | Freq: Every day | SUBCUTANEOUS | Status: DC
  Administered 2019-11-03: 22 [IU] via SUBCUTANEOUS
  Filled 2019-11-03 (×3): qty 0.22

## 2019-11-03 MED ORDER — INSULIN DETEMIR 100 UNIT/ML ~~LOC~~ SOLN: 8.0000 [IU] | Freq: Every day | SUBCUTANEOUS | Status: DC

## 2019-11-03 MED ORDER — NICOTINE 21 MG/24HR TD PT24
21.0000 mg | MEDICATED_PATCH | Freq: Every day | TRANSDERMAL | Status: DC
  Filled 2019-11-03 (×3): qty 1

## 2019-11-03 MED ORDER — GABAPENTIN 300 MG PO CAPS
900.0000 mg | ORAL_CAPSULE | Freq: Two times a day (BID) | ORAL | Status: DC
  Administered 2019-11-03 – 2019-11-08 (×10): 900 mg via ORAL
  Filled 2019-11-03 (×10): qty 3

## 2019-11-03 MED ORDER — PREDNISONE 20 MG PO TABS
40.0000 mg | ORAL_TABLET | Freq: Every day | ORAL | Status: DC
  Administered 2019-11-03 – 2019-11-08 (×6): 40 mg via ORAL
  Filled 2019-11-03 (×6): qty 2

## 2019-11-03 MED ORDER — SODIUM CHLORIDE 0.9 % IV SOLN: 250.0000 mL | INTRAVENOUS | Status: DC | PRN

## 2019-11-03 MED ORDER — BUDESONIDE 0.5 MG/2ML IN SUSP
0.5000 mg | Freq: Two times a day (BID) | RESPIRATORY_TRACT | Status: DC
  Administered 2019-11-03 – 2019-11-08 (×11): 0.5 mg via RESPIRATORY_TRACT
  Filled 2019-11-03 (×11): qty 2

## 2019-11-03 MED ORDER — SODIUM CHLORIDE 0.9% FLUSH: 3.0000 mL | INTRAVENOUS | Status: DC | PRN

## 2019-11-03 MED ORDER — ENOXAPARIN SODIUM 40 MG/0.4ML ~~LOC~~ SOLN
40.0000 mg | SUBCUTANEOUS | Status: DC
  Administered 2019-11-03 – 2019-11-07 (×5): 40 mg via SUBCUTANEOUS
  Filled 2019-11-03 (×5): qty 0.4

## 2019-11-03 MED ORDER — DM-GUAIFENESIN ER 30-600 MG PO TB12
1.0000 | ORAL_TABLET | Freq: Two times a day (BID) | ORAL | Status: DC
  Administered 2019-11-03 – 2019-11-08 (×11): 1 via ORAL
  Filled 2019-11-03 (×12): qty 1

## 2019-11-03 MED ORDER — VANCOMYCIN HCL 2000 MG/400ML IV SOLN
2000.0000 mg | Freq: Once | INTRAVENOUS | Status: AC
  Administered 2019-11-03: 2000 mg via INTRAVENOUS
  Filled 2019-11-03: qty 400

## 2019-11-03 MED ORDER — SODIUM CHLORIDE 0.9 % IV SOLN
2.0000 g | Freq: Once | INTRAVENOUS | Status: AC
  Administered 2019-11-03: 2 g via INTRAVENOUS
  Filled 2019-11-03: qty 2

## 2019-11-03 MED ORDER — CARBAMAZEPINE ER 100 MG PO TB12
400.0000 mg | ORAL_TABLET | Freq: Two times a day (BID) | ORAL | Status: DC
  Administered 2019-11-03 – 2019-11-08 (×11): 400 mg via ORAL
  Filled 2019-11-03 (×3): qty 4
  Filled 2019-11-03 (×3): qty 1
  Filled 2019-11-03 (×3): qty 4
  Filled 2019-11-03: qty 1
  Filled 2019-11-03: qty 4
  Filled 2019-11-03: qty 1
  Filled 2019-11-03 (×3): qty 4
  Filled 2019-11-03 (×2): qty 1

## 2019-11-03 MED ORDER — ESCITALOPRAM OXALATE 10 MG PO TABS
10.0000 mg | ORAL_TABLET | Freq: Every day | ORAL | Status: DC
  Administered 2019-11-03 – 2019-11-08 (×6): 10 mg via ORAL
  Filled 2019-11-03 (×7): qty 1

## 2019-11-03 MED ORDER — ATORVASTATIN CALCIUM 40 MG PO TABS
80.0000 mg | ORAL_TABLET | Freq: Every day | ORAL | Status: DC
  Administered 2019-11-03 – 2019-11-08 (×6): 80 mg via ORAL
  Filled 2019-11-03 (×7): qty 2

## 2019-11-03 MED ORDER — IOHEXOL 350 MG/ML SOLN
100.0000 mL | Freq: Once | INTRAVENOUS | Status: AC | PRN
  Administered 2019-11-03: 100 mL via INTRAVENOUS

## 2019-11-03 MED ORDER — PANTOPRAZOLE SODIUM 40 MG PO TBEC
40.0000 mg | DELAYED_RELEASE_TABLET | Freq: Every day | ORAL | Status: DC
  Administered 2019-11-03 – 2019-11-08 (×6): 40 mg via ORAL
  Filled 2019-11-03 (×7): qty 1

## 2019-11-03 MED ORDER — ALBUTEROL SULFATE HFA 108 (90 BASE) MCG/ACT IN AERS
4.0000 | INHALATION_SPRAY | Freq: Once | RESPIRATORY_TRACT | Status: AC
  Administered 2019-11-03: 4 via RESPIRATORY_TRACT
  Filled 2019-11-03: qty 6.7

## 2019-11-03 MED ORDER — ACETAMINOPHEN 325 MG PO TABS
650.0000 mg | ORAL_TABLET | Freq: Four times a day (QID) | ORAL | Status: DC | PRN
  Administered 2019-11-04: 650 mg via ORAL
  Filled 2019-11-03: qty 2

## 2019-11-03 MED ORDER — SODIUM CHLORIDE 0.9 % IV SOLN: INTRAVENOUS | Status: AC

## 2019-11-03 MED ORDER — HYDROCODONE-ACETAMINOPHEN 5-325 MG PO TABS
1.0000 | ORAL_TABLET | Freq: Three times a day (TID) | ORAL | Status: DC | PRN
  Administered 2019-11-03 – 2019-11-07 (×12): 1 via ORAL
  Filled 2019-11-03 (×14): qty 1

## 2019-11-03 MED ORDER — SODIUM CHLORIDE 0.9 % IV SOLN
2.0000 g | INTRAVENOUS | Status: DC
  Administered 2019-11-03 – 2019-11-04 (×2): 2 g via INTRAVENOUS
  Filled 2019-11-03 (×2): qty 20

## 2019-11-03 MED ORDER — SODIUM CHLORIDE 0.9% FLUSH
3.0000 mL | Freq: Two times a day (BID) | INTRAVENOUS | Status: DC
  Administered 2019-11-03 – 2019-11-08 (×9): 3 mL via INTRAVENOUS

## 2019-11-03 MED ORDER — SODIUM CHLORIDE 0.9 % IV SOLN
500.0000 mg | INTRAVENOUS | Status: AC
  Administered 2019-11-03 – 2019-11-07 (×5): 500 mg via INTRAVENOUS
  Filled 2019-11-03 (×5): qty 500

## 2019-11-03 MED ORDER — IPRATROPIUM-ALBUTEROL 0.5-2.5 (3) MG/3ML IN SOLN
3.0000 mL | Freq: Four times a day (QID) | RESPIRATORY_TRACT | Status: DC
  Administered 2019-11-03 – 2019-11-06 (×14): 3 mL via RESPIRATORY_TRACT
  Filled 2019-11-03 (×14): qty 3

## 2019-11-03 MED ORDER — INSULIN ASPART 100 UNIT/ML ~~LOC~~ SOLN
0.0000 [IU] | Freq: Every day | SUBCUTANEOUS | Status: DC
  Administered 2019-11-03: 2 [IU] via SUBCUTANEOUS
  Administered 2019-11-04: 5 [IU] via SUBCUTANEOUS
  Administered 2019-11-05 – 2019-11-07 (×3): 3 [IU] via SUBCUTANEOUS

## 2019-11-03 MED ORDER — INSULIN ASPART 100 UNIT/ML ~~LOC~~ SOLN
0.0000 [IU] | Freq: Three times a day (TID) | SUBCUTANEOUS | Status: DC
  Administered 2019-11-03: 2 [IU] via SUBCUTANEOUS
  Administered 2019-11-03 – 2019-11-04 (×3): 8 [IU] via SUBCUTANEOUS
  Administered 2019-11-04 – 2019-11-05 (×3): 3 [IU] via SUBCUTANEOUS
  Administered 2019-11-06: 8 [IU] via SUBCUTANEOUS
  Administered 2019-11-06: 3 [IU] via SUBCUTANEOUS
  Administered 2019-11-07 (×3): 8 [IU] via SUBCUTANEOUS
  Administered 2019-11-08: 3 [IU] via SUBCUTANEOUS
  Filled 2019-11-03 (×2): qty 1

## 2019-11-03 MED ORDER — METHOCARBAMOL 500 MG PO TABS
500.0000 mg | ORAL_TABLET | Freq: Three times a day (TID) | ORAL | Status: DC | PRN
  Administered 2019-11-03 – 2019-11-07 (×8): 500 mg via ORAL
  Filled 2019-11-03 (×9): qty 1

## 2019-11-03 NOTE — ED Notes (Signed)
Pt gone to CT 

## 2019-11-03 NOTE — H&P (Signed)
History and Physical    Brent [name] UTM:[id number] DOB: 1965/04/10 DOA: 11/03/2019  PCP: [facility]   Patient coming from: [place]  I have personally briefly reviewed patient's old medical records in [facility]  Chief Complaint: Shortness of breath, worsening wheezing and productive cough.  HPI: Brent [name] is a 54 y.o. male with medical history significant of tobacco abuse, COPD, type 2 diabetes (chronically on insulin), seizures, hypertension, HLD and GERD; who presented to the emergency department secondary to worsening shortness of breath, productive cough and not feeling well.  At home he uses 2 L nasal cannula supplementation from his COPD and has required higher level of oxygen.  He has a recent visit to [facility] where he was diagnosed with community-acquired pneumonia and admitted to the hospital for further evaluation and management; he left AGAINST MEDICAL ADVICE.  Symptom has come continue worsening at home and he came to receive further evaluation and management.  Patient is currently no febrile, reporting ongoing left-sided chest discomfort from known rib fractures, having productive coughing spells (without hemoptysis), expressing no nausea, no vomiting, no abdominal pain, no dysuria, no melena, no hematochezia, no hematuria, no neurologic deficit.  Of note, patient is vaccinated against Covid.  ED Course: CT chest demonstrated worsening patchy infectious infiltrates in the right upper lobe and right middle lobe with some right lower lobe consolidation and/collapsing features.  There is a mild left base atelectasis.  No pulmonary embolism.  WBC is elevated at 16.3, patient requiring higher level of oxygen supplementation to maintain O2 sats and not feeling well.  Cultures taken, antibiotics initiated TRH contacted to me patient for further evaluation and management.  Review of Systems: As per HPI otherwise all other systems  reviewed and are negative.   Past Medical History:  Diagnosis Date  . Aplastic anemia, unspecified (HCC)   . Chest pain, unspecified   . Chronic airway obstruction, not elsewhere classified   . Cocaine substance abuse (HCC)   . Other and unspecified hyperlipidemia   . Shortness of breath   . Tobacco use disorder   . Type II or unspecified type diabetes mellitus without mention of complication, not stated as uncontrolled   . Unspecified epilepsy without mention of intractable epilepsy   . Unspecified essential hypertension     Past Surgical History:  Procedure Laterality Date  . CARDIAC CATHETERIZATION  10/02/2010    Nonobstructive mild coronary plaque  . PERFORATED VISCUS SURGERY     MULTIPLE FRACTURES    Social History  reports that he has been smoking cigarettes. He has a 30.00 pack-year smoking history. He has never used smokeless tobacco. He reports current alcohol use. No history on file for drug use.  No Known Allergies  Family History  Problem Relation Age of Onset  . Heart attack Father   . COPD Father   . Coronary artery disease Mother     Prior to Admission medications   Medication Sig Start Date End Date Taking? Authorizing Provider  albuterol (PROAIR HFA) 108 (90 Base) MCG/ACT inhaler Inhale 2 puffs into the lungs every 6 (six) hours as needed for wheezing or shortness of breath.   Yes [provider]  albuterol (PROVENTIL) (2.5 MG/3ML) 0.083% nebulizer solution Take 2.5 mg by nebulization every 4 (four) hours as needed for wheezing or shortness of breath.    Yes [provider]  atorvastatin (LIPITOR) 80 MG tablet Take 80 mg by mouth daily. 10/27/19  Yes [provider]  benazepril (LOTENSIN) 10 MG tablet Take 10 mg by mouth daily.     Yes [provider]  Budeson-Glycopyrrol-Formoterol (BREZTRI AEROSPHERE) 160-9-4.8 MCG/ACT AERO Inhale 2 puffs into the lungs in the morning and at bedtime. 07/18/19  Yes Young, Joni Fears D, MD   budesonide-formoterol [facility]) 160-4.5 MCG/ACT inhaler Inhale 2 puffs into the lungs in the morning and at bedtime.  06/29/18  Yes [provider]  carbamazepine (TEGRETOL XR) 400 MG 12 hr tablet Take 1 tablet (400 mg total) by mouth 2 (two) times daily. 05/01/19  Yes Van Clines, MD  diclofenac (VOLTAREN) 75 MG EC tablet Take 75 mg by mouth. 12/21/16  Yes [provider]  escitalopram (LEXAPRO) 10 MG tablet Take 10 mg by mouth daily.  06/01/18  Yes [provider]  fenofibrate 160 MG tablet Take 160 mg by mouth daily.  12/11/16  Yes [provider]  gabapentin (NEURONTIN) 300 MG capsule Take 3 caps in AM, 3 caps at noon, 4 caps at bedtime Patient taking differently: Take 900-1,200 mg by mouth See admin instructions. Take 3 caps in AM, 3 caps at noon, 4 caps at bedtime 05/01/19  Yes Van Clines, MD  HYDROcodone-acetaminophen (NORCO/VICODIN) 5-325 MG tablet Take 1 tablet by mouth every 6 (six) hours as needed for moderate pain.  10/29/19 11/03/19 Yes [provider]  insulin glargine (LANTUS) 100 UNIT/ML injection Inject 40 Units into the skin at bedtime. Patient states that he takes 40 units in the morning and 40 units at night.   Yes [provider]  metFORMIN (GLUCOPHAGE) 1000 MG tablet Take 1,000 mg by mouth 2 (two) times daily with a meal.     Yes [provider]  omeprazole (PRILOSEC) 40 MG capsule Take 1 capsule (40 mg total) by mouth 2 (two) times daily. 03/21/19  Yes Tawni Pummel A, PA-C  tiZANidine (ZANAFLEX) 4 MG tablet Take 4 mg by mouth every 8 (eight) hours as needed for muscle spasms.  05/26/19  Yes [provider]    Physical Exam: Vitals:   11/03/19 0847 11/03/19 0848  BP: (!) 167/104   Pulse: (!) 112   Resp: (!) 25   Temp: 98.2 F (36.8 C)   TempSrc: Oral   SpO2: 90%   Weight:  95.3 kg  Height:  6\' 1"  (1.854 m)    Constitutional: Mild distress secondary to shortness of breath and very short  winded with activity.  Requiring higher level of oxygen supplementation. Vitals:   11/03/19 0847 11/03/19 0848  BP: (!) 167/104   Pulse: (!) 112   Resp: (!) 25   Temp: 98.2 F (36.8 C)   TempSrc: Oral   SpO2: 90%   Weight:  95.3 kg  Height:  6\' 1"  (1.854 m)   Eyes: PERRL, lids and conjunctivae normal; no icterus, no nystagmus. ENMT: Mucous membranes are moist. Posterior pharynx clear of any exudate or lesions. Neck: normal, supple, no masses, no thyromegaly, no JVD. Respiratory: Decreased breath sounds bilaterally; diffuse rhonchi and positive expiratory wheezing.  No using accessory muscle.  Positive tachypnea appreciated. Cardiovascular: Sinus tachycardia, no murmurs / rubs / gallops. No extremity edema. 2+ pedal pulses. No carotid bruits.  Abdomen: no tenderness, no masses palpated. No hepatosplenomegaly. Bowel sounds positive.  Musculoskeletal: no clubbing / cyanosis. No joint deformity upper and lower extremities. Good ROM, no contractures. Normal muscle tone.  Left costal area tender to palpation from known rib fractures. Skin: no rashes, no petechiae. Neurologic: CN 2-12 grossly intact.  Sensation intact, DTR normal. Strength 5/5 in all 4.  Psychiatric: Normal judgment and insight. Alert and oriented x 3. Normal mood.    Labs on Admission: I have personally reviewed following labs and imaging studies  CBC: Recent Labs  Lab 11/03/19 0918  WBC 16.5*  NEUTROABS 14.3*  HGB 14.7  HCT 44.3  MCV 102.3*  PLT 285    Basic Metabolic Panel: Recent Labs  Lab 11/03/19 0918  NA 137  K 3.8  CL 102  CO2 26  GLUCOSE 147*  BUN 20  CREATININE 0.87  CALCIUM 9.0    GFR: Estimated Creatinine Clearance: 109.7 mL/min (by C-G formula based on SCr of 0.87 mg/dL).  Liver Function Tests: No results for input(s): AST, ALT, ALKPHOS, BILITOT, PROT, ALBUMIN in the last 168 hours.  Urine analysis: No results found for: COLORURINE, APPEARANCEUR, LABSPEC, PHURINE, GLUCOSEU, HGBUR,  BILIRUBINUR, KETONESUR, PROTEINUR, UROBILINOGEN, NITRITE, LEUKOCYTESUR  Radiological Exams on Admission: CT Angio Chest PE W and/or Wo Contrast  Result Date: 11/03/2019 CLINICAL DATA:  Shortness of breath over the last week. Recent pneumonia diagnosis. Assess for pulmonary emboli. EXAM: CT ANGIOGRAPHY CHEST WITH CONTRAST TECHNIQUE: Multidetector CT imaging of the chest was performed using the standard protocol during bolus administration of intravenous contrast. Multiplanar CT image reconstructions and MIPs were obtained to evaluate the vascular anatomy. CONTRAST:  OMNIPAQUE IOHEXOL 350 MG/ML SOLN COMPARISON:  Chest radiography 11/01/2019.  Chest CT 10/29/2019 FINDINGS: Cardiovascular: Pulmonary arterial opacification is good. There are no pulmonary emboli. There is aortic atherosclerosis without aneurysm or dissection. Coronary artery calcification is noted. Heart size is normal. No pericardial fluid. Mediastinum/Nodes: Interval enlargement of a subcarinal lymph node probably reactive to pneumonia. Lungs/Pleura: Background centrilobular emphysema particularly notable in the left upper lobe. Development of right lower lobe consolidation/collapse. Worsening of patchy infectious infiltrates in the right upper lobe and right middle lobe. Left lung shows mild atelectasis in the lower lobe, improved since the previous study. Resolution of a few areas of patchy infiltrate in the left lung seen 5 days ago. Upper Abdomen: Fatty change of the liver. No acute upper abdominal finding. Musculoskeletal: Redemonstration of recent fractures of the left eighth, ninth and tenth ribs. No evidence of spinal injury. Review of the MIP images confirms the above findings. IMPRESSION: 1. No pulmonary emboli. 2. Development of right lower lobe consolidation/collapse. Worsening of patchy infectious infiltrates in the right upper lobe and right middle lobe. 3. Mild left base atelectasis.  Improved since the previous study. 4.  Redemonstration of recent fractures of the left eighth, ninth and tenth ribs. 5. Emphysema and aortic atherosclerosis. Aortic Atherosclerosis (ICD10-I70.0) and Emphysema (ICD10-J43.9). Electronically Signed   By: Paulina Fusi M.D.   On: 11/03/2019 10:27   EKG: None.  Assessment/Plan 1-acute on chronic respiratory failure with hypoxia: In the setting of community-acquired pneumonia and COPD exacerbation -Patient admitted for IV antibiotics, mucolytic's, nebulizer management, steroids and further supportive care -Sputum cultures have been requested -Respiratory panel positive for rhinovirus -At time of admission Covid test pending; patient vaccinated with a recent negative Covid test at [facility]. -Wean oxygen supplementation as tolerated to his baseline (2-3 L nasal cannula). -Follow clinical response.  2-type 2 diabetes with hyperglycemia and chronic use of insulin -Will check A1c -Sliding scale insulin and Lantus has been ordered on admission. -Anticipated increase in his CBGs with use of his steroids.  3-GERD/GI prophylaxis -continue PPI  4-HTN -Continue current antihypertensive regimen  5-hyperlipidemia -Checking lipid panel -Continue statins.  6-history of seizure -No  seizure appreciated at this time -Continue home antiepileptic regimen.  7-tobacco abuse: Cessation counseling has been provided-nicotine patch has been ordered.  8-left ribs fractures -Present on admission -Continue as needed analgesics -No displacement of his fractures appreciated on images.  9-diabetic neuropathy -Continue the use of Neurontin.  10-depression -Continue Lexapro -No suicidal ideation or hallucination.  DVT prophylaxis: Lovenox Code Status:   Full code Family Communication:  No family at bedside. Disposition Plan:   Patient is from: Home  Anticipated DC to:  Home  Anticipated DC date:  To be determined.  Anticipated DC barriers: Stabilization of respiratory  status.  Consults called:  None Admission status:  Inpatient, length of stay more than 2 midnights; med-surg bed  Severity of Illness: Moderate severity; curb 65 more than 2; patient admitted to MedSurg bed for IV antibiotics, steroids, nebulizer management and further clinical intervention.   Vassie Lollarlos Calix Heinbaugh MD [facility]  How to contact the [facility] Attending or Consulting provider 7A - 7P or covering provider during after hours 7P -7A, for this patient?   1. Check the care team in [facility] and look for a) attending/consulting [facility] provider listed and b) the [facility] team listed 2. Log into [email or web address] and use [facility] universal password to access. If you do not have the password, please contact the hospital operator. 3. Locate the [facility] provider you are looking for under [facility] and page to a number that you can be directly reached. 4. If you still have difficulty reaching the provider, please page the [facility] (Director on Call) for the Hospitalists listed on amion for assistance.  11/03/2019, 11:12 AM

## 2019-11-03 NOTE — ED Triage Notes (Signed)
Pt brought in by [facility] for c/o breathing difficulty; pt was seen at [facility] earlier this week and admitted for pneumonia but pt left AMA and pt told EMS he is not allowed back at [facility]; pt was given Rocephin 1GM IM in Left buttock; pt has 2 fractured ribs on left side from a fall last week

## 2019-11-04 DIAGNOSIS — B348 Other viral infections of unspecified site: Secondary | ICD-10-CM | POA: Diagnosis present

## 2019-11-04 DIAGNOSIS — I10 Essential (primary) hypertension: Secondary | ICD-10-CM | POA: Diagnosis present

## 2019-11-04 DIAGNOSIS — J9621 Acute and chronic respiratory failure with hypoxia: Secondary | ICD-10-CM | POA: Diagnosis present

## 2019-11-04 LAB — GLUCOSE, CAPILLARY
Glucose-Capillary: 176 mg/dL — ABNORMAL HIGH (ref 70–99)
Glucose-Capillary: 262 mg/dL — ABNORMAL HIGH (ref 70–99)
Glucose-Capillary: 267 mg/dL — ABNORMAL HIGH (ref 70–99)
Glucose-Capillary: 360 mg/dL — ABNORMAL HIGH (ref 70–99)

## 2019-11-04 LAB — LIPID PANEL
Cholesterol: 172 mg/dL (ref 0–200)
HDL: 25 mg/dL — ABNORMAL LOW (ref >40)
LDL Cholesterol: 73 mg/dL (ref 0–99)
Total CHOL/HDL Ratio: 6.9 RATIO
Triglycerides: 369 mg/dL — ABNORMAL HIGH (ref <150)
VLDL: 74 mg/dL — ABNORMAL HIGH (ref 0–40)

## 2019-11-04 LAB — PROCALCITONIN: Procalcitonin: 0.18 ng/mL

## 2019-11-04 MED ORDER — INSULIN DETEMIR 100 UNIT/ML ~~LOC~~ SOLN
30.0000 [IU] | Freq: Every day | SUBCUTANEOUS | Status: DC
  Administered 2019-11-04 – 2019-11-07 (×4): 30 [IU] via SUBCUTANEOUS
  Filled 2019-11-04 (×5): qty 0.3

## 2019-11-04 NOTE — Plan of Care (Signed)
  Problem: Education: Goal: Knowledge of General Education information will improve Description Including pain rating scale, medication(s)/side effects and non-pharmacologic comfort measures Outcome: Progressing   Problem: Clinical Measurements: Goal: Ability to maintain clinical measurements within normal limits will improve Outcome: Progressing   Problem: Activity: Goal: Risk for activity intolerance will decrease Outcome: Progressing   

## 2019-11-04 NOTE — Progress Notes (Signed)
Brent [name], Brent [name], Brent [name]  Brief History    Brent [name] is a 54 y.o. male with [name] history significant of tobacco abuse, COPD, type 2 diabetes (chronically on insulin), seizures, hypertension, HLD and GERD; who presented to the emergency department secondary to worsening shortness of breath, productive cough and not feeling well.  At home he uses 2 L nasal cannula supplementation from his COPD and has required higher level of oxygen.  He has a recent visit to [facility] where he was diagnosed with community-acquired pneumonia and admitted to the hospital for further evaluation and management; he left AGAINST [name] ADVICE.  Symptom has come continue worsening at home and he came to receive further evaluation and management.  Patient is currently no febrile, reporting ongoing left-sided chest discomfort from known rib fractures, having productive coughing spells (without hemoptysis), expressing no nausea, no vomiting, no abdominal pain, no dysuria, no melena, no hematochezia, no hematuria, no neurologic deficit.  Of note, patient is vaccinated against Covid.  ED Course: CT chest demonstrated worsening patchy infectious infiltrates in the right upper lobe and right middle lobe with some right lower lobe consolidation and/collapsing features.  There is a mild left base atelectasis.  No pulmonary embolism.  WBC is elevated at 16.3, patient requiring higher level of oxygen supplementation to maintain O2 sats and not feeling well.  Cultures taken, antibiotics initiated TRH contacted to me patient for further evaluation and management.  Consultants  . None  Procedures  . None  Antibiotics   Anti-infectives (From admission, onward)   Start     Dose/Rate Route Frequency Ordered Stop   11/03/19 1400  cefTRIAXone (ROCEPHIN) 2 g in sodium chloride 0.9 % 100 mL IVPB        2 g 200  mL/hr over 30 Minutes Intravenous Every 24 hours 11/03/19 1110 11/08/19 1359   11/03/19 1200  azithromycin (ZITHROMAX) 500 mg in sodium chloride 0.9 % 250 mL IVPB        500 mg 250 mL/hr over 60 Minutes Intravenous Every 24 hours 11/03/19 1110 11/08/19 1159   11/03/19 0915  ceFEPIme (MAXIPIME) 2 g in sodium chloride 0.9 % 100 mL IVPB        2 g 200 mL/hr over 30 Minutes Intravenous  Once 11/03/19 0910 11/03/19 1117   11/03/19 0915  vancomycin (VANCOREADY) IVPB 2000 mg/400 mL        2,000 mg 200 mL/hr over 120 Minutes Intravenous  Once 11/03/19 0910 11/03/19 1248    .   Subjective  The patient is resting comfortably. No new complaints. He feels tht his breathing is improved.  Objective   Vitals:  Vitals:   11/04/19 1247 11/04/19 1558  BP:  (!) 158/90  Pulse:  100  Resp:  20  Temp:    SpO2: 95% 92%   Exam:  Constitutional:  . The patient is awake, alert, and oriented x 3. No acute distress. Respiratory:  Marland Kitchen Mildly increased work of breathing. . Diminished breath sounds bilaterally. . No wheezes, rales, or rhonchi . No tactile fremitus Cardiovascular:  . Regular rate and rhythm . No murmurs, ectopy, or gallups. . No lateral PMI. No thrills. Abdomen:  . Abdomen is soft, non-tender, non-distended . No hernias, masses, or organomegaly . Normoactive bowel sounds.  Musculoskeletal:  . No cyanosis, clubbing, or edema Skin:  . No rashes, lesions, ulcers . palpation of skin: no induration or nodules Neurologic:  .  CN 2-12 intact . Sensation all 4 extremities intact Psychiatric:  . Mental status o Mood, affect appropriate o Orientation to person, place, time  . judgment and insight appear intact  I have personally reviewed the following:   Today's Data  . Vitals, lipid panel, glucoses  Micro Data  . Respiratory panel: Rhinovirus/enterovirus positive  Scheduled Meds: . arformoterol  15 mcg Nebulization BID  . atorvastatin  80 mg Oral Daily  . budesonide  (PULMICORT) nebulizer solution  0.5 mg Nebulization BID  . carbamazepine  400 mg Oral BID  . dextromethorphan-guaiFENesin  1 tablet Oral BID  . enoxaparin (LOVENOX) injection  40 mg Subcutaneous Q24H  . escitalopram  10 mg Oral Daily  . gabapentin  1,200 mg Oral QHS  . gabapentin  900 mg Oral BID  . insulin aspart  0-15 Units Subcutaneous TID WC  . insulin aspart  0-5 Units Subcutaneous QHS  . insulin detemir  22 Units Subcutaneous QHS  . ipratropium-albuterol  3 mL Nebulization QID  . nicotine  21 mg Transdermal Daily  . pantoprazole  40 mg Oral Daily  . predniSONE  40 mg Oral Q breakfast  . sodium chloride flush  3 mL Intravenous Q12H   Continuous Infusions: . sodium chloride    . azithromycin 500 mg (11/04/19 1158)  . cefTRIAXone (ROCEPHIN)  IV 2 g (11/04/19 1432)      LOS: 1 day    A & P   Problem  Acute On Chronic Respiratory Failure With Hypoxia (Hcc)  Rhinovirus Infection  Essential Hypertension  Hyperlipidemia Associated With Type 2 Diabetes Mellitus (Hcc)  Copd With Acute Exacerbation (Hcc)  Seizures (Hcc)  DM II (diabetes mellitus, type II), controlled (HCC)  Gerd (Gastroesophageal Reflux Disease)  Tobacco Abuse   Acute on chronic respiratory failure with hypoxia: In the setting of community-acquired pneumonia and COPD exacerbation due to rhinovirus infection. Pt with elevated WBC, but afebrile. Baseline oxygen requirements are 2L Pt requiring 6L today. He is receiving IV antibiotics, mucolytics, nebulizer treatments, steroids, etc. COVID negative. .  Type 2 diabetes with hyperglycemia and chronic use of insulin: HbA1c is 6.7. Continue sliding scale insulin and Lantus has been ordered on admission. Anticipated increase in his CBGs with use of his steroids. Monitor.  GERD/GI prophylaxis: Continue PPI  Essential HTN: Continue current antihypertensive regimen  Hyperlipidemia: Lipid panel evaluated. Continue statin as previous.  History of seizure disorder:  noted. Continue home antiepileptic regimen.  Tobacco abuse:Cessation counseling has been provided-nicotine patch has been ordered.  Left ribs fractures: Present on admission. Continue as needed analgesics. No displacement of his fractures appreciated on images.  Diabetic neuropathy: Continue the use of Neurontin.  Depression: Continue Lexapro. No suicidal ideation or hallucination.  I have seen and examined this patient myself. I have seen and examined this patient myself.   DVT prophylaxis:      Lovenox Code Status:              Full code Family Communication:       No family at bedside. Disposition Plan:              Patient is from:            Home             Anticipated DC to:                   Home             Anticipated DC  date:               To be determined.             Anticipated DC barriers:         Stabilization of respiratory status.  Elmus Mathes, DO [facility] Direct contact: see [email or web address]  7PM-7AM contact night coverage as above 11/04/2019, 5:01 PM  LOS: 1 day

## 2019-11-04 NOTE — Plan of Care (Signed)
  Problem: Health Behavior/Discharge Planning: Goal: Ability to manage health-related needs will improve Outcome: Progressing   Problem: Clinical Measurements: Goal: Respiratory complications will improve Outcome: Progressing   Problem: Nutrition: Goal: Adequate nutrition will be maintained Outcome: Progressing   Problem: Pain Managment: Goal: General experience of comfort will improve Outcome: Progressing   Problem: Safety: Goal: Ability to remain free from injury will improve Outcome: Progressing   

## 2019-11-05 LAB — CBC WITH DIFFERENTIAL/PLATELET
Abs Immature Granulocytes: 0.15 10*3/uL — ABNORMAL HIGH (ref 0.00–0.07)
Basophils Absolute: 0.1 10*3/uL (ref 0.0–0.1)
Basophils Relative: 0 %
Eosinophils Absolute: 0.2 10*3/uL (ref 0.0–0.5)
Eosinophils Relative: 1 %
HCT: 40.6 % (ref 39.0–52.0)
Hemoglobin: 13.5 g/dL (ref 13.0–17.0)
Immature Granulocytes: 1 %
Lymphocytes Relative: 11 %
Lymphs Abs: 1.3 10*3/uL (ref 0.7–4.0)
MCH: 34.4 pg — ABNORMAL HIGH (ref 26.0–34.0)
MCHC: 33.3 g/dL (ref 30.0–36.0)
MCV: 103.3 fL — ABNORMAL HIGH (ref 80.0–100.0)
Monocytes Absolute: 0.6 10*3/uL (ref 0.1–1.0)
Monocytes Relative: 5 %
Neutro Abs: 9.8 10*3/uL — ABNORMAL HIGH (ref 1.7–7.7)
Neutrophils Relative %: 82 %
Platelets: 296 10*3/uL (ref 150–400)
RBC: 3.93 MIL/uL — ABNORMAL LOW (ref 4.22–5.81)
RDW: 12.9 % (ref 11.5–15.5)
WBC: 12 10*3/uL — ABNORMAL HIGH (ref 4.0–10.5)
nRBC: 0 % (ref 0.0–0.2)

## 2019-11-05 LAB — COMPREHENSIVE METABOLIC PANEL
ALT: 21 U/L (ref 0–44)
AST: 15 U/L (ref 15–41)
Albumin: 3 g/dL — ABNORMAL LOW (ref 3.5–5.0)
Alkaline Phosphatase: 66 U/L (ref 38–126)
Anion gap: 9 (ref 5–15)
BUN: 18 mg/dL (ref 6–20)
CO2: 31 mmol/L (ref 22–32)
Calcium: 9.2 mg/dL (ref 8.9–10.3)
Chloride: 100 mmol/L (ref 98–111)
Creatinine, Ser: 0.79 mg/dL (ref 0.61–1.24)
GFR calc Af Amer: >60 mL/min (ref >60)
GFR calc non Af Amer: >60 mL/min (ref >60)
Glucose, Bld: 173 mg/dL — ABNORMAL HIGH (ref 70–99)
Potassium: 3.8 mmol/L (ref 3.5–5.1)
Sodium: 140 mmol/L (ref 135–145)
Total Bilirubin: 0.5 mg/dL (ref 0.3–1.2)
Total Protein: 6.5 g/dL (ref 6.5–8.1)

## 2019-11-05 LAB — GLUCOSE, CAPILLARY
Glucose-Capillary: 153 mg/dL — ABNORMAL HIGH (ref 70–99)
Glucose-Capillary: 178 mg/dL — ABNORMAL HIGH (ref 70–99)
Glucose-Capillary: 412 mg/dL — ABNORMAL HIGH (ref 70–99)
Glucose-Capillary: 282 mg/dL — ABNORMAL HIGH (ref 70–99)

## 2019-11-05 MED ORDER — TRAZODONE HCL 50 MG PO TABS
50.0000 mg | ORAL_TABLET | Freq: Every day | ORAL | Status: DC
  Administered 2019-11-05 – 2019-11-07 (×3): 50 mg via ORAL
  Filled 2019-11-05 (×3): qty 1

## 2019-11-05 MED ORDER — BENAZEPRIL HCL 10 MG PO TABS
10.0000 mg | ORAL_TABLET | Freq: Every day | ORAL | Status: DC
  Administered 2019-11-05 – 2019-11-08 (×4): 10 mg via ORAL
  Filled 2019-11-05 (×4): qty 1

## 2019-11-05 MED ORDER — INSULIN ASPART 100 UNIT/ML ~~LOC~~ SOLN
20.0000 [IU] | Freq: Once | SUBCUTANEOUS | Status: AC
  Administered 2019-11-05: 20 [IU] via SUBCUTANEOUS

## 2019-11-05 MED ORDER — TIZANIDINE HCL 4 MG PO TABS
4.0000 mg | ORAL_TABLET | Freq: Three times a day (TID) | ORAL | Status: DC | PRN
  Administered 2019-11-05: 4 mg via ORAL
  Filled 2019-11-05: qty 1

## 2019-11-05 NOTE — Progress Notes (Signed)
Patient requesting pain medication to "help him sleep".  Explained to pt that he got his pain medication with his night time medications at 2048 and it is ordered for every 8 hours.  Patient then requested something to help him sleep.

## 2019-11-05 NOTE — Progress Notes (Signed)
CRITICAL VALUE ALERT  Critical Value:  CBG 412  Date & Time Notied:  11/05/2019 @ 1639  Provider Notified: Swayze via page  Orders Received/Actions taken:   One time dose of 20 units of NovoLog ordered.  See MAR.

## 2019-11-05 NOTE — Progress Notes (Signed)
Trazodone ordered for sleep.  Went to give medication after pharmacy approved it and pt was snoring.  Did not wake pt up.

## 2019-11-05 NOTE — Progress Notes (Addendum)
PROGRESS NOTE  Brent [name] QIW:[id number] DOB: Aug 29, 1965 DOA: 11/03/2019 PCP: [facility]  Brief History    Brent [name] is a 54 y.o. male with medical history significant of tobacco abuse, COPD, type 2 diabetes (chronically on insulin), seizures, hypertension, HLD and GERD; who presented to the emergency department secondary to worsening shortness of breath, productive cough and not feeling well.  At home he uses 2 L nasal cannula supplementation from his COPD and has required higher level of oxygen.  He has a recent visit to [facility] where he was diagnosed with community-acquired pneumonia and admitted to the hospital for further evaluation and management; he left AGAINST MEDICAL ADVICE.  Symptom has come continue worsening at home and he came to receive further evaluation and management.  Patient is currently no febrile, reporting ongoing left-sided chest discomfort from known rib fractures, having productive coughing spells (without hemoptysis), expressing no nausea, no vomiting, no abdominal pain, no dysuria, no melena, no hematochezia, no hematuria, no neurologic deficit.  Of note, patient is vaccinated against Covid.  ED Course: CT chest demonstrated worsening patchy infectious infiltrates in the right upper lobe and right middle lobe with some right lower lobe consolidation and/collapsing features.  There is a mild left base atelectasis.  No pulmonary embolism.  WBC is elevated at 16.3, patient requiring higher level of oxygen supplementation to maintain O2 sats and not feeling well.  Cultures taken, antibiotics initiated TRH contacted to me patient for further evaluation and management.  Consultants  . None  Procedures  . None  Antibiotics   Anti-infectives (From admission, onward)   Start     Dose/Rate Route Frequency Ordered Stop   11/03/19 1400  cefTRIAXone (ROCEPHIN) 2 g in sodium chloride 0.9 % 100 mL IVPB        2 g 200  mL/hr over 30 Minutes Intravenous Every 24 hours 11/03/19 1110 11/08/19 1359   11/03/19 1200  azithromycin (ZITHROMAX) 500 mg in sodium chloride 0.9 % 250 mL IVPB        500 mg 250 mL/hr over 60 Minutes Intravenous Every 24 hours 11/03/19 1110 11/08/19 1159   11/03/19 0915  ceFEPIme (MAXIPIME) 2 g in sodium chloride 0.9 % 100 mL IVPB        2 g 200 mL/hr over 30 Minutes Intravenous  Once 11/03/19 0910 11/03/19 1117   11/03/19 0915  vancomycin (VANCOREADY) IVPB 2000 mg/400 mL        2,000 mg 200 mL/hr over 120 Minutes Intravenous  Once 11/03/19 0910 11/03/19 1248      Subjective  The patient is sitting up at bedside. He states that he is living a little better today. No new complaints.  Objective   Vitals:  Vitals:   11/05/19 0738 11/05/19 1120  BP:    Pulse:    Resp:    Temp:    SpO2: 92% 93%   Exam:  Constitutional:  . The patient is awake, alert, and oriented x 3. No acute distress. Respiratory:  . No increased work of breathing. . Diminished breath sounds bilaterally. . No wheezes, rales, or rhonchi . No tactile fremitus Cardiovascular:  . Regular rate and rhythm . No murmurs, ectopy, or gallups. . No lateral PMI. No thrills. Abdomen:  . Abdomen is soft, non-tender, non-distended . No hernias, masses, or organomegaly . Normoactive bowel sounds.  Musculoskeletal:  . No cyanosis, clubbing, or edema Skin:  . No rashes, lesions, ulcers . palpation of skin: no induration  or nodules Neurologic:  . CN 2-12 intact . Sensation all 4 extremities intact Psychiatric:  . Mental status o Mood, affect appropriate o Orientation to person, place, time  . judgment and insight appear intact  I have personally reviewed the following:   Today's Data  . Vitals, CBC, CMP, glucoses, procalcitonin  Micro Data  . Respiratory panel: Rhinovirus/enterovirus positive  Scheduled Meds: . arformoterol  15 mcg Nebulization BID  . atorvastatin  80 mg Oral Daily  . benazepril  10  mg Oral Daily  . budesonide (PULMICORT) nebulizer solution  0.5 mg Nebulization BID  . carbamazepine  400 mg Oral BID  . dextromethorphan-guaiFENesin  1 tablet Oral BID  . enoxaparin (LOVENOX) injection  40 mg Subcutaneous Q24H  . escitalopram  10 mg Oral Daily  . gabapentin  1,200 mg Oral QHS  . gabapentin  900 mg Oral BID  . insulin aspart  0-15 Units Subcutaneous TID WC  . insulin aspart  0-5 Units Subcutaneous QHS  . insulin detemir  30 Units Subcutaneous QHS  . ipratropium-albuterol  3 mL Nebulization QID  . nicotine  21 mg Transdermal Daily  . pantoprazole  40 mg Oral Daily  . predniSONE  40 mg Oral Q breakfast  . sodium chloride flush  3 mL Intravenous Q12H  . traZODone  50 mg Oral QHS   Continuous Infusions: . sodium chloride    . azithromycin 500 mg (11/04/19 1158)  . cefTRIAXone (ROCEPHIN)  IV 2 g (11/04/19 1432)      LOS: 2 days    A & P   No problems updated. Acute on chronic respiratory failure with hypoxia: In the setting of community-acquired pneumonia and COPD exacerbation due to rhinovirus infection. Pt with elevated WBC, but afebrile. Baseline oxygen requirements are 2L Pt requiring 6L today. He is receiving IV antibiotics, mucolytics, nebulizer treatments, steroids, etc. COVID negative. Procalcitonin was negative. Will stop IV antibiotics. Wean steroids.  Type 2 diabetes with hyperglycemia and chronic use of insulin: HbA1c is 6.7. Continue sliding scale insulin and Lantus has been ordered on admission. Anticipated increase in his CBGs with use of his steroids. Although will be weaning it to off. Monitor.  GERD/GI prophylaxis: Continue PPI  Essential HTN: Continue current antihypertensive regimen  Hyperlipidemia: Lipid panel evaluated. Continue statin as previous.  History of seizure disorder: noted. Continue home antiepileptic regimen.  Tobacco abuse:Cessation counseling has been provided-nicotine patch has been ordered.  Left ribs fractures:  Present on admission. Continue as needed analgesics. No displacement of his fractures appreciated on images.  Diabetic neuropathy: Continue the use of Neurontin.  Depression: Continue Lexapro. No suicidal ideation or hallucination.  I have seen and examined this patient myself. I have seen and examined this patient myself.   DVT prophylaxis:      Lovenox Code Status:              Full code Family Communication:       No family at bedside. Disposition Plan:              Patient is from:            Home             Anticipated DC to:                   Home             Anticipated DC date:  To be determined.             Anticipated DC barriers:         Stabilization of respiratory status.  Cyan Clippinger, DO [facility] Direct contact: see [email or web address]  7PM-7AM contact night coverage as above 11/05/2019, 11:50 PM  LOS: 1 day

## 2019-11-06 LAB — GLUCOSE, CAPILLARY
Glucose-Capillary: 116 mg/dL — ABNORMAL HIGH (ref 70–99)
Glucose-Capillary: 194 mg/dL — ABNORMAL HIGH (ref 70–99)
Glucose-Capillary: 291 mg/dL — ABNORMAL HIGH (ref 70–99)
Glucose-Capillary: 285 mg/dL — ABNORMAL HIGH (ref 70–99)

## 2019-11-06 MED ORDER — IPRATROPIUM-ALBUTEROL 0.5-2.5 (3) MG/3ML IN SOLN
3.0000 mL | Freq: Three times a day (TID) | RESPIRATORY_TRACT | Status: DC
  Administered 2019-11-07 – 2019-11-08 (×4): 3 mL via RESPIRATORY_TRACT
  Filled 2019-11-06 (×3): qty 3

## 2019-11-06 NOTE — ED Provider Notes (Signed)
[facility] Provider Note   CSN: [id number] Arrival date & time: 11/03/19  0827     History Chief Complaint  Patient presents with  . Shortness of Breath    Read Driversimothy C Lamica is a 54 y.o. male.  HPI    54 y.o. male with medical history significant of tobacco abuse, COPD, type 2 diabetes (chronically on insulin), seizures, hypertension, HLD and GERD; who presented to the emergency department secondary to worsening shortness of breath, productive cough and not feeling well.  At home he uses 2 L nasal cannula supplementation from his COPD and has required higher level of oxygen.  He has a recent visit to [facility] where he was diagnosed with community-acquired pneumonia and admitted to the hospital for further evaluation and management; he left AGAINST MEDICAL ADVICE.  Symptom has come continue worsening at home and he came to receive further evaluation and management.  Patient is currently no febrile, reporting ongoing left-sided chest discomfort from known rib fractures, having productive coughing spells (without hemoptysis), expressing no nausea, no vomiting, no abdominal pain, no dysuria, no melena, no hematochezia, no hematuria, no neurologic deficit. Past Medical History:  Diagnosis Date  . Aplastic anemia, unspecified (HCC)   . Chest pain, unspecified   . Chronic airway obstruction, not elsewhere classified   . Cocaine substance abuse (HCC)   . Other and unspecified hyperlipidemia   . Shortness of breath   . Tobacco use disorder   . Type II or unspecified type diabetes mellitus without mention of complication, not stated as uncontrolled   . Unspecified epilepsy without mention of intractable epilepsy   . Unspecified essential hypertension     Patient Active Problem List   Diagnosis Date Noted  . Acute on chronic respiratory failure with hypoxia (HCC) 11/04/2019  . Rhinovirus infection 11/04/2019  . Essential hypertension 11/04/2019  . CAP  (community acquired pneumonia) 11/03/2019  . Diabetic polyneuropathy associated with diabetes mellitus due to underlying condition (HCC) 02/06/2019  . Localization-related (focal) (partial) symptomatic epilepsy and epileptic syndromes with complex partial seizures, intractable, without status epilepticus (HCC) 02/06/2019  . Hyperlipidemia associated with type 2 diabetes mellitus (HCC) 11/26/2018  . Cough 11/03/2018  . Obstructive sleep apnea 11/03/2018  . Dysphagia 09/27/2018  . Eczema 09/27/2018  . Right leg weakness 01/19/2018  . History of arthroscopy of knee 11/15/2017  . Complex tear of lateral meniscus of right knee as current injury 10/27/2017  . Effusion of right knee 07/26/2017  . 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 Cellulitis 07/22/2017  . Primary osteoarthritis involving multiple joints 06/22/2017  . 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 Right knee pain 02/01/2017  . Acute bronchitis 12/07/2016  . Acute idiopathic gout of left foot 12/07/2016  . Anxiety, generalized 12/07/2016  . COPD with acute exacerbation (HCC) 12/07/2016  . Seizures (HCC) 12/07/2016  . CAD (coronary artery disease) 12/19/2010  . DM II (diabetes mellitus, type II), controlled (HCC) 12/19/2010  . Hypertension associated with diabetes (HCC) 12/19/2010  . GERD (gastroesophageal reflux disease) 12/19/2010  . Hypertriglyceridemia 12/19/2010  . Tobacco abuse 12/19/2010    Past Surgical History:  Procedure Laterality Date  . CARDIAC CATHETERIZATION  10/02/2010    Nonobstructive mild coronary plaque  . PERFORATED VISCUS SURGERY     MULTIPLE FRACTURES       Family History  Problem Relation Age of Onset  . Heart attack Father   . COPD Father   . Coronary  artery disease Mother     Social History   Tobacco Use  . Smoking status: Current Every Day Smoker    Packs/day: 1.00    Years: 30.00    Pack years: 30.00    Types: Cigarettes  .  Smokeless tobacco: Never Used  Substance Use Topics  . Alcohol use: Yes    Comment: drinks beer on the weekends  . Drug use: Not on file    Home Medications Prior to Admission medications   Medication Sig Start Date End Date Taking? Authorizing Provider  albuterol (PROAIR HFA) 108 (90 Base) MCG/ACT inhaler Inhale 2 puffs into the lungs every 6 (six) hours as needed for wheezing or shortness of breath.   Yes [provider]  albuterol (PROVENTIL) (2.5 MG/3ML) 0.083% nebulizer solution Take 2.5 mg by nebulization every 4 (four) hours as needed for wheezing or shortness of breath.    Yes [provider]  atorvastatin (LIPITOR) 80 MG tablet Take 80 mg by mouth daily. 10/27/19  Yes [provider]  benazepril (LOTENSIN) 10 MG tablet Take 10 mg by mouth daily.     Yes [provider]  Budeson-Glycopyrrol-Formoterol (BREZTRI AEROSPHERE) 160-9-4.8 MCG/ACT AERO Inhale 2 puffs into the lungs in the morning and at bedtime. 07/18/19  Yes Young, Joni Fears D, MD  budesonide-formoterol [facility]) 160-4.5 MCG/ACT inhaler Inhale 2 puffs into the lungs in the morning and at bedtime.  06/29/18  Yes [provider]  carbamazepine (TEGRETOL XR) 400 MG 12 hr tablet Take 1 tablet (400 mg total) by mouth 2 (two) times daily. 05/01/19  Yes Van Clines, MD  diclofenac (VOLTAREN) 75 MG EC tablet Take 75 mg by mouth. 12/21/16  Yes [provider]  escitalopram (LEXAPRO) 10 MG tablet Take 10 mg by mouth daily.  06/01/18  Yes [provider]  fenofibrate 160 MG tablet Take 160 mg by mouth daily.  12/11/16  Yes [provider]  gabapentin (NEURONTIN) 300 MG capsule Take 3 caps in AM, 3 caps at noon, 4 caps at bedtime Patient taking differently: Take 900-1,200 mg by mouth See admin instructions. Take 3 caps in AM, 3 caps at noon, 4 caps at bedtime 05/01/19  Yes Van Clines, MD  insulin glargine (LANTUS) 100 UNIT/ML injection Inject 40 Units into the skin  at bedtime. Patient states that he takes 40 units in the morning and 40 units at night.   Yes [provider]  metFORMIN (GLUCOPHAGE) 1000 MG tablet Take 1,000 mg by mouth 2 (two) times daily with a meal.     Yes [provider]  omeprazole (PRILOSEC) 40 MG capsule Take 1 capsule (40 mg total) by mouth 2 (two) times daily. 03/21/19  Yes Tawni Pummel A, PA-C  tiZANidine (ZANAFLEX) 4 MG tablet Take 4 mg by mouth every 8 (eight) hours as needed for muscle spasms.  05/26/19  Yes [provider]    Allergies    Patient has no known allergies.  Review of Systems   Review of Systems All systems reviewed and negative, other than as noted in HPI.  Physical Exam Updated Vital Signs BP (!) 152/91 (BP Location: Right Arm)   Pulse 75   Temp 98.1 F (36.7 C)   Resp 18   Ht 6\' 1"  (1.854 m)   Wt 96.4 kg   SpO2 98%   BMI 28.05 kg/m   Physical Exam Vitals and nursing note reviewed.  Constitutional:      General: He is not in acute distress.  Appearance: He is well-developed.  HENT:     Head: Normocephalic and atraumatic.  Eyes:     General:        Right eye: No discharge.        Left eye: No discharge.     Conjunctiva/sclera: Conjunctivae normal.  Cardiovascular:     Rate and Rhythm: Regular rhythm. Tachycardia present.     Heart sounds: Normal heart sounds. No murmur heard.  No friction rub. No gallop.   Pulmonary:     Effort: Pulmonary effort is normal. No respiratory distress.     Breath sounds: Normal breath sounds.  Abdominal:     General: There is no distension.     Palpations: Abdomen is soft.     Tenderness: There is no abdominal tenderness.  Musculoskeletal:        General: No tenderness.     Cervical back: Neck supple.  Skin:    General: Skin is warm and dry.  Neurological:     Mental Status: He is alert.  Psychiatric:        Behavior: Behavior normal.        Thought Content: Thought content normal.     ED Results / Procedures /  Treatments   Labs (all labs ordered are listed, but only abnormal results are displayed) Labs Reviewed  RESPIRATORY PANEL BY PCR - Abnormal; Notable for the following components:      Result Value   Rhinovirus / Enterovirus DETECTED (*)    All other components within normal limits  CBC WITH DIFFERENTIAL/PLATELET - Abnormal; Notable for the following components:   WBC 16.5 (*)    MCV 102.3 (*)    Neutro Abs 14.3 (*)    Monocytes Absolute 1.2 (*)    Abs Immature Granulocytes 0.15 (*)    All other components within normal limits  BASIC METABOLIC PANEL - Abnormal; Notable for the following components:   Glucose, Bld 147 (*)    All other components within normal limits  BRAIN NATRIURETIC PEPTIDE - Abnormal; Notable for the following components:   B Natriuretic Peptide 107.0 (*)    All other components within normal limits  HEMOGLOBIN A1C - Abnormal; Notable for the following components:   Hgb A1c MFr Bld 6.7 (*)    All other components within normal limits  BLOOD GAS, ARTERIAL - Abnormal; Notable for the following components:   pO2, Arterial 65.9 (*)    Acid-Base Excess 2.8 (*)    All other components within normal limits  LIPID PANEL - Abnormal; Notable for the following components:   Triglycerides 369 (*)    HDL 25 (*)    VLDL 74 (*)    All other components within normal limits  GLUCOSE, CAPILLARY - Abnormal; Notable for the following components:   Glucose-Capillary 206 (*)    All other components within normal limits  GLUCOSE, CAPILLARY - Abnormal; Notable for the following components:   Glucose-Capillary 176 (*)    All other components within normal limits  GLUCOSE, CAPILLARY - Abnormal; Notable for the following components:   Glucose-Capillary 262 (*)    All other components within normal limits  GLUCOSE, CAPILLARY - Abnormal; Notable for the following components:   Glucose-Capillary 267 (*)    All other components within normal limits  CBC WITH DIFFERENTIAL/PLATELET -  Abnormal; Notable for the following components:   WBC 12.0 (*)    RBC 3.93 (*)    MCV 103.3 (*)    MCH 34.4 (*)    Neutro Abs  9.8 (*)    Abs Immature Granulocytes 0.15 (*)    All other components within normal limits  COMPREHENSIVE METABOLIC PANEL - Abnormal; Notable for the following components:   Glucose, Bld 173 (*)    Albumin 3.0 (*)    All other components within normal limits  GLUCOSE, CAPILLARY - Abnormal; Notable for the following components:   Glucose-Capillary 360 (*)    All other components within normal limits  GLUCOSE, CAPILLARY - Abnormal; Notable for the following components:   Glucose-Capillary 153 (*)    All other components within normal limits  GLUCOSE, CAPILLARY - Abnormal; Notable for the following components:   Glucose-Capillary 178 (*)    All other components within normal limits  GLUCOSE, CAPILLARY - Abnormal; Notable for the following components:   Glucose-Capillary 412 (*)    All other components within normal limits  GLUCOSE, CAPILLARY - Abnormal; Notable for the following components:   Glucose-Capillary 282 (*)    All other components within normal limits  GLUCOSE, CAPILLARY - Abnormal; Notable for the following components:   Glucose-Capillary 116 (*)    All other components within normal limits  CBG MONITORING, ED - Abnormal; Notable for the following components:   Glucose-Capillary 147 (*)    All other components within normal limits  CBG MONITORING, ED - Abnormal; Notable for the following components:   Glucose-Capillary 254 (*)    All other components within normal limits  CULTURE, BLOOD (ROUTINE X 2)  CULTURE, BLOOD (ROUTINE X 2)  SARS CORONAVIRUS 2 BY RT PCR (HOSPITAL ORDER, PERFORMED IN [facility])  EXPECTORATED SPUTUM ASSESSMENT W REFEX TO RESP CULTURE  EXPECTORATED SPUTUM ASSESSMENT W REFEX TO RESP CULTURE  LACTIC ACID, PLASMA  HIV ANTIBODY (ROUTINE TESTING W REFLEX)  PROCALCITONIN  STREP PNEUMONIAE URINARY ANTIGEN   LEGIONELLA PNEUMOPHILA SEROGP 1 UR AG    EKG None  Radiology No results found.   CT Angio Chest PE W and/or Wo Contrast  Result Date: 11/03/2019 CLINICAL DATA:  Shortness of breath over the last week. Recent pneumonia diagnosis. Assess for pulmonary emboli. EXAM: CT ANGIOGRAPHY CHEST WITH CONTRAST TECHNIQUE: Multidetector CT imaging of the chest was performed using the standard protocol during bolus administration of intravenous contrast. Multiplanar CT image reconstructions and MIPs were obtained to evaluate the vascular anatomy. CONTRAST:  OMNIPAQUE IOHEXOL 350 MG/ML SOLN COMPARISON:  Chest radiography 11/01/2019.  Chest CT 10/29/2019 FINDINGS: Cardiovascular: Pulmonary arterial opacification is good. There are no pulmonary emboli. There is aortic atherosclerosis without aneurysm or dissection. Coronary artery calcification is noted. Heart size is normal. No pericardial fluid. Mediastinum/Nodes: Interval enlargement of a subcarinal lymph node probably reactive to pneumonia. Lungs/Pleura: Background centrilobular emphysema particularly notable in the left upper lobe. Development of right lower lobe consolidation/collapse. Worsening of patchy infectious infiltrates in the right upper lobe and right middle lobe. Left lung shows mild atelectasis in the lower lobe, improved since the previous study. Resolution of a few areas of patchy infiltrate in the left lung seen 5 days ago. Upper Abdomen: Fatty change of the liver. No acute upper abdominal finding. Musculoskeletal: Redemonstration of recent fractures of the left eighth, ninth and tenth ribs. No evidence of spinal injury. Review of the MIP images confirms the above findings. IMPRESSION: 1. No pulmonary emboli. 2. Development of right lower lobe consolidation/collapse. Worsening of patchy infectious infiltrates in the right upper lobe and right middle lobe. 3. Mild left base atelectasis.  Improved since the previous study. 4. Redemonstration of  recent fractures of  the left eighth, ninth and tenth ribs. 5. Emphysema and aortic atherosclerosis. Aortic Atherosclerosis (ICD10-I70.0) and Emphysema (ICD10-J43.9). Electronically Signed   By: Paulina Fusi M.D.   On: 11/03/2019 10:27    Procedures Procedures (including critical care time)  Medications Ordered in ED Medications  enoxaparin (LOVENOX) injection 40 mg (40 mg Subcutaneous Given 11/05/19 1213)  0.9 %  sodium chloride infusion (0 mLs Intravenous Stopped 11/04/19 0903)  sodium chloride flush (NS) 0.9 % injection 3 mL (3 mLs Intravenous Given 11/06/19 0858)  sodium chloride flush (NS) 0.9 % injection 3 mL (has no administration in time range)  0.9 %  sodium chloride infusion (has no administration in time range)  azithromycin (ZITHROMAX) 500 mg in sodium chloride 0.9 % 250 mL IVPB (500 mg Intravenous New Bag/Given 11/05/19 1212)  dextromethorphan-guaiFENesin (MUCINEX DM) 30-600 MG per 12 hr tablet 1 tablet (1 tablet Oral Given 11/06/19 0855)  predniSONE (DELTASONE) tablet 40 mg (40 mg Oral Given 11/06/19 0855)  budesonide (PULMICORT) nebulizer solution 0.5 mg (0.5 mg Nebulization Given 11/06/19 0750)  arformoterol (BROVANA) nebulizer solution 15 mcg (15 mcg Nebulization Given 11/06/19 0755)  ipratropium-albuterol (DUONEB) 0.5-2.5 (3) MG/3ML nebulizer solution 3 mL (3 mLs Nebulization Given 11/06/19 0745)  pantoprazole (PROTONIX) EC tablet 40 mg (40 mg Oral Given 11/06/19 0858)  insulin aspart (novoLOG) injection 0-15 Units (0 Units Subcutaneous Not Given 11/06/19 0857)  insulin aspart (novoLOG) injection 0-5 Units (3 Units Subcutaneous Given 11/05/19 2112)  atorvastatin (LIPITOR) tablet 80 mg (80 mg Oral Given 11/06/19 0855)  carbamazepine (TEGRETOL XR) 12 hr tablet 400 mg (400 mg Oral Given 11/06/19 0855)  escitalopram (LEXAPRO) tablet 10 mg (10 mg Oral Given 11/06/19 0855)  gabapentin (NEURONTIN) capsule 900 mg (900 mg Oral Given 11/06/19 0520)  gabapentin (NEURONTIN) capsule 1,200 mg (1,200 mg  Oral Given 11/05/19 2106)  HYDROcodone-acetaminophen (NORCO/VICODIN) 5-325 MG per tablet 1 tablet (1 tablet Oral Given 11/06/19 0521)  acetaminophen (TYLENOL) tablet 650 mg (650 mg Oral Given 11/04/19 1228)  methocarbamol (ROBAXIN) tablet 500 mg (500 mg Oral Given 11/06/19 0521)  nicotine (NICODERM CQ - dosed in mg/24 hours) patch 21 mg (21 mg Transdermal Not Given 11/06/19 0856)  insulin detemir (LEVEMIR) injection 30 Units (30 Units Subcutaneous Given 11/05/19 2108)  traZODone (DESYREL) tablet 50 mg (50 mg Oral Given 11/05/19 2106)  benazepril (LOTENSIN) tablet 10 mg (10 mg Oral Given 11/06/19 0855)  tiZANidine (ZANAFLEX) tablet 4 mg (4 mg Oral Given 11/05/19 0932)  ceFEPIme (MAXIPIME) 2 g in sodium chloride 0.9 % 100 mL IVPB (0 g Intravenous Stopped 11/03/19 1117)  vancomycin (VANCOREADY) IVPB 2000 mg/400 mL (0 mg Intravenous Stopped 11/03/19 1248)  morphine 4 MG/ML injection 4 mg (4 mg Intravenous Given 11/03/19 0934)  albuterol (VENTOLIN HFA) 108 (90 Base) MCG/ACT inhaler 4 puff (4 puffs Inhalation Given 11/03/19 0938)  iohexol (OMNIPAQUE) 350 MG/ML injection 100 mL (100 mLs Intravenous Contrast Given 11/03/19 0953)  insulin aspart (novoLOG) injection 20 Units (20 Units Subcutaneous Given 11/05/19 1720)    ED Course  I have reviewed the triage vital signs and the nursing notes.  Pertinent labs & imaging results that were available during my care of the patient were reviewed by me and considered in my medical decision making (see chart for details).    MDM Rules/Calculators/A&P                           Final Clinical Impression(s) / ED Diagnoses Final diagnoses:  Community acquired pneumonia,  unspecified laterality    Rx / DC Orders ED Discharge Orders    None       Raeford Razor, MD 11/06/19 [phone]

## 2019-11-06 NOTE — Progress Notes (Addendum)
PROGRESS NOTE  Brent [name] FFM:[id number] DOB: [birth year] DOA: 11/03/2019 PCP: [facility]  Brief History    Brent [name] is a 54 y.o. male with medical history significant of tobacco abuse, COPD, type 2 diabetes (chronically on insulin), seizures, hypertension, HLD and GERD; who presented to the emergency department secondary to worsening shortness of breath, productive cough and not feeling well.  At home he uses 2 L nasal cannula supplementation from his COPD and has required higher level of oxygen.  He has a recent visit to [facility] where he was diagnosed with community-acquired pneumonia and admitted to the hospital for further evaluation and management; he left AGAINST MEDICAL ADVICE.  Symptom has come continue worsening at home and he came to receive further evaluation and management.  Patient is currently no febrile, reporting ongoing left-sided chest discomfort from known rib fractures, having productive coughing spells (without hemoptysis), expressing no nausea, no vomiting, no abdominal pain, no dysuria, no melena, no hematochezia, no hematuria, no neurologic deficit.  Of note, patient is vaccinated against Covid.  ED Course: CT chest demonstrated worsening patchy infectious infiltrates in the right upper lobe and right middle lobe with some right lower lobe consolidation and/collapsing features.  There is a mild left base atelectasis.  No pulmonary embolism.  WBC is elevated at 16.3, patient requiring higher level of oxygen supplementation to maintain O2 sats and not feeling well.  Cultures taken, antibiotics initiated [facility] contacted to me patient for further evaluation and management.  Consultants  . None  Procedures  . None  Antibiotics   Anti-infectives (From admission, onward)   Start     Dose/Rate Route Frequency Ordered Stop   11/03/19 1400  cefTRIAXone (ROCEPHIN) 2 g in sodium chloride 0.9 % 100 mL IVPB  Status:   Discontinued        2 g 200 mL/hr over 30 Minutes Intravenous Every 24 hours 11/03/19 1110 11/05/19 1151   11/03/19 1200  azithromycin (ZITHROMAX) 500 mg in sodium chloride 0.9 % 250 mL IVPB        500 mg 250 mL/hr over 60 Minutes Intravenous Every 24 hours 11/03/19 1110 11/08/19 1159   11/03/19 0915  ceFEPIme (MAXIPIME) 2 g in sodium chloride 0.9 % 100 mL IVPB        2 g 200 mL/hr over 30 Minutes Intravenous  Once 11/03/19 0910 11/03/19 1117   11/03/19 0915  vancomycin (VANCOREADY) IVPB 2000 mg/400 mL        2,000 mg 200 mL/hr over 120 Minutes Intravenous  Once 11/03/19 0910 11/03/19 1248      Subjective  The patient is sitting up at bedside.  No new complaints.  Objective   Vitals:  Vitals:   11/06/19 0755 11/06/19 1113  BP:    Pulse:    Resp:    Temp:    SpO2: (!) 89% 91%   Exam:  Constitutional:  . The patient is awake, alert, and oriented x 3. No acute distress. Respiratory:  . No increased work of breathing. . Diminished breath sounds bilaterally. . No wheezes, rales, or rhonchi . No tactile fremitus Cardiovascular:  . Regular rate and rhythm . No murmurs, ectopy, or gallups. . No lateral PMI. No thrills. Abdomen:  . Abdomen is soft, non-tender, non-distended . No hernias, masses, or organomegaly . Normoactive bowel sounds.  Musculoskeletal:  . No cyanosis, clubbing, or edema Skin:  . No rashes, lesions, ulcers . palpation of skin: no induration or nodules Neurologic:  .  CN 2-12 intact . Sensation all 4 extremities intact Psychiatric:  . Mental status o Mood, affect appropriate o Orientation to person, place, time  . judgment and insight appear intact  I have personally reviewed the following:   Today's Data  . Dispensing optician  . Respiratory panel: Rhinovirus/enterovirus positive  Scheduled Meds: . arformoterol  15 mcg Nebulization BID  . atorvastatin  80 mg Oral Daily  . benazepril  10 mg Oral Daily  . budesonide (PULMICORT) nebulizer  solution  0.5 mg Nebulization BID  . carbamazepine  400 mg Oral BID  . dextromethorphan-guaiFENesin  1 tablet Oral BID  . enoxaparin (LOVENOX) injection  40 mg Subcutaneous Q24H  . escitalopram  10 mg Oral Daily  . gabapentin  1,200 mg Oral QHS  . gabapentin  900 mg Oral BID  . insulin aspart  0-15 Units Subcutaneous TID WC  . insulin aspart  0-5 Units Subcutaneous QHS  . insulin detemir  30 Units Subcutaneous QHS  . ipratropium-albuterol  3 mL Nebulization QID  . nicotine  21 mg Transdermal Daily  . pantoprazole  40 mg Oral Daily  . predniSONE  40 mg Oral Q breakfast  . sodium chloride flush  3 mL Intravenous Q12H  . traZODone  50 mg Oral QHS   Continuous Infusions: . sodium chloride    . azithromycin 500 mg (11/06/19 1303)      LOS: 3 days    A & P   No problems updated. Acute on chronic respiratory failure with hypoxia: In the setting of community-acquired pneumonia and COPD exacerbation due to rhinovirus infection. Pt with elevated WBC, but afebrile. Baseline oxygen requirements are 2L Pt requiring 6L today. He is receiving IV antibiotics, mucolytics, nebulizer treatments, steroids, etc. COVID negative. Procalcitonin was negative. Will stop IV antibiotics. Wean steroids and oxygen as possible.  Type 2 diabetes with hyperglycemia and chronic use of insulin: HbA1c is 6.7. Continue sliding scale insulin and Lantus has been ordered on admission. Anticipated increase in his CBGs with use of his steroids. Although will be weaning it to off. Monitor.  GERD/GI prophylaxis: Continue PPI  Essential HTN: Continue current antihypertensive regimen  Hyperlipidemia: Lipid panel evaluated. Continue statin as previous.  History of seizure disorder: noted. Continue home antiepileptic regimen.  Tobacco abuse:Cessation counseling has been provided-nicotine patch has been ordered.  Left ribs fractures: Present on admission. Continue as needed analgesics. No displacement of his  fractures appreciated on images.  Diabetic neuropathy: Continue the use of Neurontin.  Depression: Continue Lexapro. No suicidal ideation or hallucination.  I have seen and examined this patient myself. I have spent 28 minutes in his evaluation and care.  DVT prophylaxis:      Lovenox Code Status:              Full code Family Communication:       No family at bedside. Disposition Plan:              Patient is from:            Home             Anticipated DC to:                   Home             Anticipated DC date:               To be determined.  Anticipated DC barriers:         Stabilization of respiratory status.  Maeva Dant, DO [facility] Direct contact: see [email or web address]  7PM-7AM contact night coverage as above 11/06/2019,2:04 PM  LOS: 1 day

## 2019-11-07 LAB — GLUCOSE, CAPILLARY
Glucose-Capillary: 185 mg/dL — ABNORMAL HIGH (ref 70–99)
Glucose-Capillary: 285 mg/dL — ABNORMAL HIGH (ref 70–99)
Glucose-Capillary: 260 mg/dL — ABNORMAL HIGH (ref 70–99)
Glucose-Capillary: 251 mg/dL — ABNORMAL HIGH (ref 70–99)

## 2019-11-07 MED ORDER — AMLODIPINE BESYLATE 5 MG PO TABS
5.0000 mg | ORAL_TABLET | Freq: Every day | ORAL | Status: DC
  Administered 2019-11-07 – 2019-11-08 (×2): 5 mg via ORAL
  Filled 2019-11-07 (×2): qty 1

## 2019-11-07 NOTE — Progress Notes (Signed)
SATURATION QUALIFICATIONS: (This note is used to comply with regulatory documentation for home oxygen)  Patient Saturations on Room Air at Rest = 92%  Patient Saturations on Room Air while Ambulating = 91%    

## 2019-11-07 NOTE — Progress Notes (Signed)
PROGRESS NOTE  Brent [name] PIR:[id number] DOB: 1965-06-12 DOA: 11/03/2019 PCP: [facility]  Brief History    Brent [name] is a 55 y.o. male with medical history significant of tobacco abuse, COPD, type 2 diabetes (chronically on insulin), seizures, hypertension, HLD and GERD; who presented to the emergency department secondary to worsening shortness of breath, productive cough and not feeling well.  At home he uses 2 L nasal cannula supplementation from his COPD and has required higher level of oxygen.  He has a recent visit to [facility] where he was diagnosed with community-acquired pneumonia and admitted to the hospital for further evaluation and management; he left AGAINST MEDICAL ADVICE.  Symptom has come continue worsening at home and he came to receive further evaluation and management.  Patient is currently no febrile, reporting ongoing left-sided chest discomfort from known rib fractures, having productive coughing spells (without hemoptysis), expressing no nausea, no vomiting, no abdominal pain, no dysuria, no melena, no hematochezia, no hematuria, no neurologic deficit.  Of note, patient is vaccinated against Covid.  ED Course: CT chest demonstrated worsening patchy infectious infiltrates in the right upper lobe and right middle lobe with some right lower lobe consolidation and/collapsing features.  There is a mild left base atelectasis.  No pulmonary embolism.  WBC is elevated at 16.3, patient requiring higher level of oxygen supplementation to maintain O2 sats and not feeling well.  Cultures taken, antibiotics initiated [facility] contacted to me patient for further evaluation and management.  Consultants  . None  Procedures  . None  Antibiotics   Anti-infectives (From admission, onward)   Start     Dose/Rate Route Frequency Ordered Stop   11/03/19 1400  cefTRIAXone (ROCEPHIN) 2 g in sodium chloride 0.9 % 100 mL IVPB  Status:   Discontinued        2 g 200 mL/hr over 30 Minutes Intravenous Every 24 hours 11/03/19 1110 11/05/19 1151   11/03/19 1200  azithromycin (ZITHROMAX) 500 mg in sodium chloride 0.9 % 250 mL IVPB        500 mg 250 mL/hr over 60 Minutes Intravenous Every 24 hours 11/03/19 1110 11/08/19 1159   11/03/19 0915  ceFEPIme (MAXIPIME) 2 g in sodium chloride 0.9 % 100 mL IVPB        2 g 200 mL/hr over 30 Minutes Intravenous  Once 11/03/19 0910 11/03/19 1117   11/03/19 0915  vancomycin (VANCOREADY) IVPB 2000 mg/400 mL        2,000 mg 200 mL/hr over 120 Minutes Intravenous  Once 11/03/19 0910 11/03/19 1248      Subjective  The patient is lying in bed. He states that he is tired this morning.   Objective   Vitals:  Vitals:   11/07/19 0810 11/07/19 0817  BP:    Pulse:    Resp:    Temp:    SpO2: 94% 96%   Exam:  Constitutional:  . The patient is somnolent, but easily rouseable. No acute distress. Respiratory:  . No increased work of breathing. . Diminished breath sounds bilaterally. . No wheezes, rales, or rhonchi . No tactile fremitus Cardiovascular:  . Regular rate and rhythm . No murmurs, ectopy, or gallups. . No lateral PMI. No thrills. Abdomen:  . Abdomen is soft, non-tender, non-distended . No hernias, masses, or organomegaly . Normoactive bowel sounds.  Musculoskeletal:  . No cyanosis, clubbing, or edema Skin:  . No rashes, lesions, ulcers . palpation of skin: no induration or nodules Neurologic:  .  CN 2-12 intact . Sensation all 4 extremities intact Psychiatric:  . Mental status o Mood, affect appropriate o Orientation to person, place, time  . judgment and insight appear intact  I have personally reviewed the following:   Today's Data  . Vitals, glucoes  Micro Data  . Respiratory panel: Rhinovirus/enterovirus positive  Scheduled Meds: . arformoterol  15 mcg Nebulization BID  . atorvastatin  80 mg Oral Daily  . benazepril  10 mg Oral Daily  . budesonide  (PULMICORT) nebulizer solution  0.5 mg Nebulization BID  . carbamazepine  400 mg Oral BID  . dextromethorphan-guaiFENesin  1 tablet Oral BID  . enoxaparin (LOVENOX) injection  40 mg Subcutaneous Q24H  . escitalopram  10 mg Oral Daily  . gabapentin  1,200 mg Oral QHS  . gabapentin  900 mg Oral BID  . insulin aspart  0-15 Units Subcutaneous TID WC  . insulin aspart  0-5 Units Subcutaneous QHS  . insulin detemir  30 Units Subcutaneous QHS  . ipratropium-albuterol  3 mL Nebulization TID  . nicotine  21 mg Transdermal Daily  . pantoprazole  40 mg Oral Daily  . predniSONE  40 mg Oral Q breakfast  . sodium chloride flush  3 mL Intravenous Q12H  . traZODone  50 mg Oral QHS   Continuous Infusions: . sodium chloride    . azithromycin 500 mg (11/07/19 1302)      LOS: 4 days    A & P   No problems updated. Acute on chronic respiratory failure with hypoxia: In the setting of community-acquired pneumonia and COPD exacerbation due to rhinovirus infection. Pt with elevated WBC, but afebrile. Baseline oxygen requirements are 1.5L Pt requiring 6L today. He is receiving IV antibiotics, mucolytics, nebulizer treatments, steroids, etc. COVID negative. Procalcitonin was negative. Will stop IV antibiotics. Wean steroids and oxygen as possible. Today at rest the patient is saturating at 96% on 2L. Will check an ambulatory SaO2.   Type 2 diabetes with hyperglycemia and chronic use of insulin: HbA1c is 6.7. Continue sliding scale insulin and Lantus has been ordered on admission. Anticipated increase in his CBGs with use of his steroids. Although will be weaning it to off. Monitor. FSBS in the last 24 hours has been 185 - 285.  GERD/GI prophylaxis: Continue PPI  Essential HTN: Blood pressures remain a little high on home dosages of Benazepril. Will add Norvasc 5 mg daily.  Hyperlipidemia: Lipid panel evaluated. Continue statin as previous.  History of seizure disorder: noted. Continue home  antiepileptic regimen.  Tobacco abuse:Cessation counseling has been provided-nicotine patch has been ordered.  Left ribs fractures: Present on admission. Continue as needed analgesics. No displacement of his fractures appreciated on images.  Diabetic neuropathy: Continue the use of Neurontin.  Depression: Continue Lexapro. No suicidal ideation or hallucination.  I have seen and examined this patient myself. I have spent 32 minutes in his evaluation and care.  DVT prophylaxis:      Lovenox Code Status:              Full code Family Communication:       No family at bedside. Disposition Plan:              Patient is from:            Home             Anticipated DC to:  Home             Anticipated DC date:               To be determined.             Anticipated DC barriers:         Stabilization of respiratory status.  Brent Amory, DO [facility] Direct contact: see [email or web address]  7PM-7AM contact night coverage as above 11/07/2019,1:26 PM  LOS: 1 day

## 2019-11-08 LAB — CULTURE, BLOOD (ROUTINE X 2)
Culture: NO GROWTH
Culture: NO GROWTH
Special Requests: ADEQUATE
Special Requests: ADEQUATE

## 2019-11-08 LAB — GLUCOSE, CAPILLARY: Glucose-Capillary: 172 mg/dL — ABNORMAL HIGH (ref 70–99)

## 2019-11-08 MED ORDER — AMLODIPINE BESYLATE 5 MG PO TABS
5.0000 mg | ORAL_TABLET | Freq: Every day | ORAL | 0 refills | Status: DC
Start: 2019-11-08 — End: 2020-07-08

## 2019-11-08 MED ORDER — NICOTINE 21 MG/24HR TD PT24: 21.0000 mg | MEDICATED_PATCH | Freq: Every day | TRANSDERMAL | 0 refills | Status: DC

## 2019-11-08 MED ORDER — HYDROCODONE-ACETAMINOPHEN 5-325 MG PO TABS
1.0000 | ORAL_TABLET | Freq: Four times a day (QID) | ORAL | 0 refills | Status: AC | PRN
Start: 2019-11-08 — End: 2019-11-13

## 2019-11-08 MED ORDER — PREDNISONE 20 MG PO TABS: ORAL_TABLET | ORAL | 0 refills | Status: AC

## 2019-11-08 MED ORDER — DM-GUAIFENESIN ER 30-600 MG PO TB12
1.0000 | ORAL_TABLET | Freq: Two times a day (BID) | ORAL | 0 refills | Status: DC
Start: 2019-11-08 — End: 2020-09-27

## 2019-11-08 NOTE — Progress Notes (Signed)
PT dc at 1123. Went over [employer] with pt and wife. IV taken out. Nad. W/c to main entrance.

## 2019-11-08 NOTE — Discharge Summary (Signed)
Physician Discharge Summary  Brent [name] RUE:[id number] DOB: 07-Dec-1965 DOA: 11/03/2019  PCP: [facility]  Admit date: 11/03/2019 Discharge date: 11/08/2019  Recommendations for Outpatient Follow-up:  1. The patient is discharged to home on his baseline O2 of 1.5 L daily. 2. Follow up with PCP in 7-10 days.  Discharge Diagnoses: Principal diagnosis is #1 1. Acute on chronic hypoxic respiratory failure, due to COPD exac and rhinovirus  2. DM II with neuropathy 3. Hyponatremia 4. Cirrhosis with portal hypertension 5. Hypokalemia 6. Hyperlipidemia 7. GERD  Discharge Condition: Fair  Disposition: Home  Diet recommendation: Heart healthy/Carbohydrate modified.  Filed Weights   11/03/19 0848 11/03/19 1742  Weight: 95.3 kg 96.4 kg    History of present illness:  Brent [name] is a 54 y.o. male with medical history significant of tobacco abuse, COPD, type 2 diabetes (chronically on insulin), seizures, hypertension, HLD and GERD; who presented to the emergency department secondary to worsening shortness of breath, productive cough and not feeling well.  At home he uses 2 L nasal cannula supplementation from his COPD and has required higher level of oxygen.  He has a recent visit to [facility] where he was diagnosed with community-acquired pneumonia and admitted to the hospital for further evaluation and management; he left AGAINST MEDICAL ADVICE.  Symptom has come continue worsening at home and he came to receive further evaluation and management.  Patient is currently no febrile, reporting ongoing left-sided chest discomfort from known rib fractures, having productive coughing spells (without hemoptysis), expressing no nausea, no vomiting, no abdominal pain, no dysuria, no melena, no hematochezia, no hematuria, no neurologic deficit.  Of note, patient is vaccinated against Covid.  ED Course: CT chest demonstrated worsening patchy  infectious infiltrates in the right upper lobe and right middle lobe with some right lower lobe consolidation and/collapsing features.  There is a mild left base atelectasis.  No pulmonary embolism.  WBC is elevated at 16.3, patient requiring higher level of oxygen supplementation to maintain O2 sats and not feeling well.  Cultures taken, antibiotics initiated [facility] contacted to me patient for further evaluation and management.  Hospital Course:  The patient was admitted to a medical bed. He was given nebulizer treatments, steroids, mucolytics, and antibiotics. When Respiratory panel returned positive for rhinovirus. Antibiotics were discontinued. The patient's respiratory status continued to improve towards his baseline O2 requirements of 1.5 L.  He was doing well today and has been walking in the hall without O2. He will be discharged to home in fair condition.  Today's assessment: S: The patient is sitting up at bedside. No new complaints. O: Vitals:  Vitals:   11/08/19 0815 11/08/19 1035  BP:  (!) 166/90  Pulse:  95  Resp:  19  Temp:  97.8 F (36.6 C)  SpO2: 94% 91%   Exam:  Constitutional:  . The patient is awake, alert, and oriented x 3. No acute distress. Respiratory:  . No increased work of breathing. . No wheezes, rales, or rhonchi . No tactile fremitus Cardiovascular:  . Regular rate and rhythm . No murmurs, ectopy, or gallups. . No lateral PMI. No thrills. Abdomen:  . Abdomen is soft, non-tender, non-distended . No hernias, masses, or organomegaly . Normoactive bowel sounds.  Musculoskeletal:  . No cyanosis, clubbing, or edema Skin:  . No rashes, lesions, ulcers . palpation of skin: no induration or nodules Neurologic:  . CN 2-12 intact . Sensation all 4 extremities intact Psychiatric:  . Mental  status o Mood, affect appropriate o Orientation to person, place, time  . judgment and insight appear intact  Discharge Instructions  Discharge Instructions     Activity as tolerated - No restrictions   Complete by: As directed    Call MD for:  difficulty breathing, headache or visual disturbances   Complete by: As directed    Call MD for:  temperature >100.4   Complete by: As directed    Diet - low sodium heart healthy   Complete by: As directed    Diet Carb Modified   Complete by: As directed    Discharge instructions   Complete by: As directed    Discharge to home on 1.5 L O2 as previous. Follow up with PCP in 7-10 days.   Increase activity slowly   Complete by: As directed      Allergies as of 11/08/2019   No Known Allergies     Medication List    TAKE these medications   ProAir HFA 108 (90 Base) MCG/ACT inhaler Generic drug: albuterol Inhale 2 puffs into the lungs every 6 (six) hours as needed for wheezing or shortness of breath.   albuterol (2.5 MG/3ML) 0.083% nebulizer solution Commonly known as: PROVENTIL Take 2.5 mg by nebulization every 4 (four) hours as needed for wheezing or shortness of breath.   amLODipine 5 MG tablet Commonly known as: NORVASC Take 1 tablet (5 mg total) by mouth daily.   atorvastatin 80 MG tablet Commonly known as: LIPITOR Take 80 mg by mouth daily.   benazepril 10 MG tablet Commonly known as: LOTENSIN Take 10 mg by mouth daily.   Breztri Aerosphere 160-9-4.8 MCG/ACT Aero Generic drug: Budeson-Glycopyrrol-Formoterol Inhale 2 puffs into the lungs in the morning and at bedtime.   budesonide-formoterol 160-4.5 MCG/ACT inhaler Commonly known as: SYMBICORT Inhale 2 puffs into the lungs in the morning and at bedtime.   carbamazepine 400 MG 12 hr tablet Commonly known as: TEGRETOL XR Take 1 tablet (400 mg total) by mouth 2 (two) times daily.   dextromethorphan-guaiFENesin 30-600 MG 12hr tablet Commonly known as: MUCINEX DM Take 1 tablet by mouth 2 (two) times daily.   diclofenac 75 MG EC tablet Commonly known as: VOLTAREN Take 75 mg by mouth.   escitalopram 10 MG tablet Commonly known  as: LEXAPRO Take 10 mg by mouth daily.   fenofibrate 160 MG tablet Take 160 mg by mouth daily.   gabapentin 300 MG capsule Commonly known as: NEURONTIN Take 3 caps in AM, 3 caps at noon, 4 caps at bedtime What changed:   how much to take  how to take this  when to take this   HYDROcodone-acetaminophen 5-325 MG tablet Commonly known as: NORCO/VICODIN Take 1 tablet by mouth every 6 (six) hours as needed for up to 5 days for moderate pain.   insulin glargine 100 UNIT/ML injection Commonly known as: LANTUS Inject 40 Units into the skin at bedtime. Patient states that he takes 40 units in the morning and 40 units at night.   metFORMIN 1000 MG tablet Commonly known as: GLUCOPHAGE Take 1,000 mg by mouth 2 (two) times daily with a meal.   nicotine 21 mg/24hr patch Commonly known as: NICODERM CQ - dosed in mg/24 hours Place 1 patch (21 mg total) onto the skin daily.   omeprazole 40 MG capsule Commonly known as: PRILOSEC Take 1 capsule (40 mg total) by mouth 2 (two) times daily.   predniSONE 20 MG tablet Commonly known as: DELTASONE Take 2 tablets (  40 mg total) by mouth daily with breakfast for 3 days, THEN 1.5 tablets (30 mg total) daily with breakfast for 3 days, THEN 1 tablet (20 mg total) daily with breakfast for 3 days, THEN 0.5 tablets (10 mg total) daily with breakfast for 3 days. Start taking on: November 08, 2019   tiZANidine 4 MG tablet Commonly known as: ZANAFLEX Take 4 mg by mouth every 8 (eight) hours as needed for muscle spasms.      No Known Allergies  The results of significant diagnostics from this hospitalization (including imaging, microbiology, ancillary and laboratory) are listed below for reference.    Significant Diagnostic Studies: CT Angio Chest PE W and/or Wo Contrast  Result Date: 11/03/2019 CLINICAL DATA:  Shortness of breath over the last week. Recent pneumonia diagnosis. Assess for pulmonary emboli. EXAM: CT ANGIOGRAPHY CHEST WITH CONTRAST  TECHNIQUE: Multidetector CT imaging of the chest was performed using the standard protocol during bolus administration of intravenous contrast. Multiplanar CT image reconstructions and MIPs were obtained to evaluate the vascular anatomy. CONTRAST:  100mL OMNIPAQUE IOHEXOL 350 MG/ML SOLN COMPARISON:  Chest radiography 11/01/2019.  Chest CT 10/29/2019 FINDINGS: Cardiovascular: Pulmonary arterial opacification is good. There are no pulmonary emboli. There is aortic atherosclerosis without aneurysm or dissection. Coronary artery calcification is noted. Heart size is normal. No pericardial fluid. Mediastinum/Nodes: Interval enlargement of a subcarinal lymph node probably reactive to pneumonia. Lungs/Pleura: Background centrilobular emphysema particularly notable in the left upper lobe. Development of right lower lobe consolidation/collapse. Worsening of patchy infectious infiltrates in the right upper lobe and right middle lobe. Left lung shows mild atelectasis in the lower lobe, improved since the previous study. Resolution of a few areas of patchy infiltrate in the left lung seen 5 days ago. Upper Abdomen: Fatty change of the liver. No acute upper abdominal finding. Musculoskeletal: Redemonstration of recent fractures of the left eighth, ninth and tenth ribs. No evidence of spinal injury. Review of the MIP images confirms the above findings. IMPRESSION: 1. No pulmonary emboli. 2. Development of right lower lobe consolidation/collapse. Worsening of patchy infectious infiltrates in the right upper lobe and right middle lobe. 3. Mild left base atelectasis.  Improved since the previous study. 4. Redemonstration of recent fractures of the left eighth, ninth and tenth ribs. 5. Emphysema and aortic atherosclerosis. Aortic Atherosclerosis (ICD10-I70.0) and Emphysema (ICD10-J43.9). Electronically Signed   By: Paulina FusiMark  Shogry M.D.   On: 11/03/2019 10:27    Microbiology: Recent Results (from the past 240 hour(s))  Respiratory  Panel by PCR     Status: Abnormal   Collection Time: 11/03/19  9:09 AM   Specimen: Nasopharyngeal Swab; Respiratory  Result Value Ref Range Status   Adenovirus NOT DETECTED NOT DETECTED Final   Coronavirus 229E NOT DETECTED NOT DETECTED Final    Comment: (NOTE) The Coronavirus on the Respiratory Panel, DOES NOT test for the novel  Coronavirus (2019 nCoV)    Coronavirus HKU1 NOT DETECTED NOT DETECTED Final   Coronavirus NL63 NOT DETECTED NOT DETECTED Final   Coronavirus OC43 NOT DETECTED NOT DETECTED Final   Metapneumovirus NOT DETECTED NOT DETECTED Final   Rhinovirus / Enterovirus DETECTED (A) NOT DETECTED Final   Influenza A NOT DETECTED NOT DETECTED Final   Influenza B NOT DETECTED NOT DETECTED Final   Parainfluenza Virus 1 NOT DETECTED NOT DETECTED Final   Parainfluenza Virus 2 NOT DETECTED NOT DETECTED Final   Parainfluenza Virus 3 NOT DETECTED NOT DETECTED Final   Parainfluenza Virus 4 NOT DETECTED NOT DETECTED Final  Respiratory Syncytial Virus NOT DETECTED NOT DETECTED Final   Bordetella pertussis NOT DETECTED NOT DETECTED Final   Chlamydophila pneumoniae NOT DETECTED NOT DETECTED Final   Mycoplasma pneumoniae NOT DETECTED NOT DETECTED Final    Comment: Performed at [facility], [address], [place], [place] [address]  Blood culture (routine x 2)     Status: None   Collection Time: 11/03/19  9:18 AM   Specimen: BLOOD RIGHT FOREARM  Result Value Ref Range Status   Specimen Description   Final    BLOOD RIGHT FOREARM BOTTLES DRAWN AEROBIC AND ANAEROBIC   Special Requests Blood Culture adequate volume  Final   Culture   Final    NO GROWTH 5 DAYS Performed at [facility], [address], [place], [place] [address]    Report Status 11/08/2019 FINAL  Final  Blood culture (routine x 2)     Status: None   Collection Time: 11/03/19  9:18 AM   Specimen: BLOOD RIGHT WRIST  Result Value Ref Range Status   Specimen Description   Final    BLOOD RIGHT WRIST BOTTLES DRAWN  AEROBIC AND ANAEROBIC   Special Requests Blood Culture adequate volume  Final   Culture   Final    NO GROWTH 5 DAYS Performed at [facility], [address], [place], [place] [address]    Report Status 11/08/2019 FINAL  Final  SARS Coronavirus 2 by RT PCR (hospital order, performed in [facility] hospital lab) Nasopharyngeal Nasopharyngeal Swab     Status: None   Collection Time: 11/03/19  5:30 PM   Specimen: Nasopharyngeal Swab  Result Value Ref Range Status   SARS Coronavirus 2 NEGATIVE NEGATIVE Final    Comment: (NOTE) SARS-CoV-2 target nucleic acids are NOT DETECTED.  The SARS-CoV-2 RNA is generally detectable in upper and lower respiratory specimens during the acute phase of infection. The lowest concentration of SARS-CoV-2 viral copies this assay can detect is 250 copies / mL. A negative result does not preclude SARS-CoV-2 infection and should not be used as the sole basis for treatment or other patient management decisions.  A negative result may occur with improper specimen collection / handling, submission of specimen other than nasopharyngeal swab, presence of viral mutation(s) within the areas targeted by this assay, and inadequate number of viral copies (<250 copies / mL). A negative result must be combined with clinical observations, patient history, and epidemiological information.  Fact Sheet for Patients:   BoilerBrush.com.cy  Fact Sheet for Healthcare Providers: https://pope.com/  This test is not yet approved or  cleared by the [place] FDA and has been authorized for detection and/or diagnosis of SARS-CoV-2 by FDA under an Emergency Use Authorization (EUA).  This EUA will remain in effect (meaning this test can be used) for the duration of the COVID-19 declaration under Section 564(b)(1) of the Act, 21 U.S.C. section 360bbb-3(b)(1), unless the authorization is terminated or revoked sooner.  Performed at  [facility], [address], [place], [place] [address]      Labs: Basic Metabolic Panel: Recent Labs  Lab 11/03/19 0918 11/05/19 0649  NA 137 140  K 3.8 3.8  CL 102 100  CO2 26 31  GLUCOSE 147* 173*  BUN 20 18  CREATININE 0.87 0.79  CALCIUM 9.0 9.2   Liver Function Tests: Recent Labs  Lab 11/05/19 0649  AST 15  ALT 21  ALKPHOS 66  BILITOT 0.5  PROT 6.5  ALBUMIN 3.0*   No results for input(s): LIPASE, AMYLASE  in the last 168 hours. No results for input(s): AMMONIA in the last 168 hours. CBC: Recent Labs  Lab 11/03/19 0918 11/05/19 0649  WBC 16.5* 12.0*  NEUTROABS 14.3* 9.8*  HGB 14.7 13.5  HCT 44.3 40.6  MCV 102.3* 103.3*  PLT 285 296   Cardiac Enzymes: No results for input(s): CKTOTAL, CKMB, CKMBINDEX, TROPONINI in the last 168 hours. BNP: BNP (last 3 results) Recent Labs    11/03/19 0918  BNP 107.0*    ProBNP (last 3 results) No results for input(s): PROBNP in the last 8760 hours.  CBG: Recent Labs  Lab 11/07/19 0748 11/07/19 1126 11/07/19 1645 11/07/19 1951 11/08/19 0750  GLUCAP 185* 285* 260* 251* 172*    Principal Problem:   Acute on chronic respiratory failure with hypoxia (HCC) Active Problems:   DM II (diabetes mellitus, type II), controlled (HCC)   GERD (gastroesophageal reflux disease)   Tobacco abuse   COPD with acute exacerbation (HCC)   Hyperlipidemia associated with type 2 diabetes mellitus (HCC)   Seizures (HCC)   CAP (community acquired pneumonia)   Rhinovirus infection   Essential hypertension  Time coordinating discharge: 38 minutes  Signed:        Dalyn Becker, DO [facility]  11/08/2019, 12:40 PM

## 2019-12-13 ENCOUNTER — Other Ambulatory Visit: Payer: Self-pay

## 2019-12-13 ENCOUNTER — Telehealth (INDEPENDENT_AMBULATORY_CARE_PROVIDER_SITE_OTHER): Payer: Medicaid Other | Admitting: Neurology

## 2019-12-13 ENCOUNTER — Encounter: Payer: Self-pay | Admitting: Neurology

## 2019-12-13 ENCOUNTER — Ambulatory Visit: Payer: Medicaid Other | Admitting: Neurology

## 2019-12-13 DIAGNOSIS — Z5329 Procedure and treatment not carried out because of patient's decision for other reasons: Secondary | ICD-10-CM

## 2019-12-13 NOTE — Progress Notes (Signed)
Called number 3 times, no answer, no option to leave VM. Will need to reschedule.

## 2020-02-01 ENCOUNTER — Telehealth: Payer: Self-pay | Admitting: Internal Medicine

## 2020-02-01 NOTE — Telephone Encounter (Signed)
I transferred Brent [name] over to the sleep lab to resc appt

## 2020-02-01 NOTE — Telephone Encounter (Signed)
Missed his previous sleep study , needs it to be rescheduled.

## 2020-06-05 ENCOUNTER — Telehealth: Payer: Self-pay | Admitting: Neurology

## 2020-06-05 ENCOUNTER — Other Ambulatory Visit: Payer: Self-pay

## 2020-06-05 MED ORDER — GABAPENTIN 300 MG PO CAPS: ORAL_CAPSULE | ORAL | 6 refills | Status: DC

## 2020-06-05 MED ORDER — CARBAMAZEPINE ER 400 MG PO TB12: 400.0000 mg | ORAL_TABLET | Freq: Two times a day (BID) | ORAL | 6 refills | Status: DC

## 2020-06-05 NOTE — Telephone Encounter (Signed)
Patient's friend Clydie Braun called to reschedule his appointment that he missed. She also stated he needs a refill on his gabapentin and tegretol to get him to his next visit on 12/09/20.

## 2020-06-17 DIAGNOSIS — I639 Cerebral infarction, unspecified: Secondary | ICD-10-CM

## 2020-06-17 HISTORY — DX: Cerebral infarction, unspecified: I63.9

## 2020-06-18 DIAGNOSIS — L089 Local infection of the skin and subcutaneous tissue, unspecified: Secondary | ICD-10-CM | POA: Insufficient documentation

## 2020-07-08 ENCOUNTER — Ambulatory Visit (INDEPENDENT_AMBULATORY_CARE_PROVIDER_SITE_OTHER): Payer: Medicaid Other | Admitting: Cardiology

## 2020-07-08 ENCOUNTER — Encounter: Payer: Self-pay | Admitting: Cardiology

## 2020-07-08 VITALS — BP 100/68 | HR 62 | Ht 73.0 in | Wt 197.0 lb

## 2020-07-08 DIAGNOSIS — E782 Mixed hyperlipidemia: Secondary | ICD-10-CM | POA: Diagnosis not present

## 2020-07-08 DIAGNOSIS — I2510 Atherosclerotic heart disease of native coronary artery without angina pectoris: Secondary | ICD-10-CM | POA: Diagnosis not present

## 2020-07-08 NOTE — Patient Instructions (Addendum)
Medication Instructions:  Continue all current medications.  Labwork:  FLP - order given today.   Reminder:  Nothing to eat or drink after 12 midnight prior to labs.  Office will contact with results via phone or letter.    Testing/Procedures: none  Follow-Up: 6 months   Any Other Special Instructions Will Be Listed Below (If Applicable).  If you need a refill on your cardiac medications before your next appointment, please call your pharmacy.  

## 2020-07-08 NOTE — Progress Notes (Signed)
Clinical Summary Brent [name] is a 55 y.o.male seen as a new consulted, referred by Dr Shellia Carwin for the following medical problems.   1. CAD - 2012 cath nonobstructive disease - 10/2019 CT PE with coronary calcifications - has not had clnical event - no recent chest pain. No SOB/DOE   2. CVA - admssion 06/2020 [facility] - presented with left sided weakness, facial droop. outside tPA window - MRI showed 5 mm acute infarct within the posterior limb of right internal capsule, 4 mm acute infarct within the anterior limb of the left internal capsule/left basal ganglia - continued on ASA, started on plavix   3. Substance abuse - during 06/2020 admission UDS + cocaine, benzos, oxycodone  4. Hyperlipidemia 05/2020 TC 230 TG 661 HDL 32 LDL 89 - he is on statin, fenofirbate   5. COPD - on home O2  6. DM2 - 04/2020 Hgb A1c 8.2  Past Medical History:  Diagnosis Date  . Aplastic anemia, unspecified (HCC)   . Chest pain, unspecified   . Chronic airway obstruction, not elsewhere classified   . Cocaine substance abuse (HCC)   . Other and unspecified hyperlipidemia   . Shortness of breath   . Tobacco use disorder   . Type II or unspecified type diabetes mellitus without mention of complication, not stated as uncontrolled   . Unspecified epilepsy without mention of intractable epilepsy   . Unspecified essential hypertension      No Known Allergies   Current Outpatient Medications  Medication Sig Dispense Refill  . albuterol (PROAIR HFA) 108 (90 Base) MCG/ACT inhaler Inhale 2 puffs into the lungs every 6 (six) hours as needed for wheezing or shortness of breath.    Marland Kitchen albuterol (PROVENTIL) (2.5 MG/3ML) 0.083% nebulizer solution Take 2.5 mg by nebulization every 4 (four) hours as needed for wheezing or shortness of breath.     Marland Kitchen amLODipine (NORVASC) 5 MG tablet Take 1 tablet (5 mg total) by mouth daily. 30 tablet 0  . atorvastatin (LIPITOR) 80 MG tablet Take 80 mg by mouth daily.    .  benazepril (LOTENSIN) 10 MG tablet Take 10 mg by mouth daily.      . Budeson-Glycopyrrol-Formoterol (BREZTRI AEROSPHERE) 160-9-4.8 MCG/ACT AERO Inhale 2 puffs into the lungs in the morning and at bedtime. 5.9 g 0  . budesonide-formoterol (SYMBICORT) 160-4.5 MCG/ACT inhaler Inhale 2 puffs into the lungs in the morning and at bedtime.     . carbamazepine (TEGRETOL XR) 400 MG 12 hr tablet Take 1 tablet (400 mg total) by mouth 2 (two) times daily. 60 tablet 6  . dextromethorphan-guaiFENesin (MUCINEX DM) 30-600 MG 12hr tablet Take 1 tablet by mouth 2 (two) times daily. 20 tablet 0  . diclofenac (VOLTAREN) 75 MG EC tablet Take 75 mg by mouth.    . escitalopram (LEXAPRO) 10 MG tablet Take 10 mg by mouth daily.     . fenofibrate 160 MG tablet Take 160 mg by mouth daily.     Marland Kitchen gabapentin (NEURONTIN) 300 MG capsule Take 3 caps in AM, 3 caps at noon, 4 caps at bedtime 300 capsule 6  . insulin glargine (LANTUS) 100 UNIT/ML injection Inject 40 Units into the skin at bedtime. Patient states that he takes 40 units in the morning and 40 units at night.    . metFORMIN (GLUCOPHAGE) 1000 MG tablet Take 1,000 mg by mouth 2 (two) times daily with a meal.      . omeprazole (PRILOSEC) 40 MG capsule Take 1  capsule (40 mg total) by mouth 2 (two) times daily. 60 capsule 5  . tiZANidine (ZANAFLEX) 4 MG tablet Take 4 mg by mouth every 8 (eight) hours as needed for muscle spasms.      No current facility-administered medications for this visit.     Past Surgical History:  Procedure Laterality Date  . CARDIAC CATHETERIZATION  10/02/2010    Nonobstructive mild coronary plaque  . PERFORATED VISCUS SURGERY     MULTIPLE FRACTURES     No Known Allergies    Family History  Problem Relation Age of Onset  . Heart attack Father   . COPD Father   . Coronary artery disease Mother      Social History Brent [name] reports that he has been smoking cigarettes. He has a 30.00 pack-year smoking history. He has never used  smokeless tobacco. Brent [name] reports current alcohol use.   Review of Systems CONSTITUTIONAL: No weight loss, fever, chills, weakness or fatigue.  HEENT: Eyes: No visual loss, blurred vision, double vision or yellow sclerae.No hearing loss, sneezing, congestion, runny nose or sore throat.  SKIN: No rash or itching.  CARDIOVASCULAR: per hpi RESPIRATORY: No shortness of breath, cough or sputum.  GASTROINTESTINAL: No anorexia, nausea, vomiting or diarrhea. No abdominal pain or blood.  GENITOURINARY: No burning on urination, no polyuria NEUROLOGICAL: No headache, dizziness, syncope, paralysis, ataxia, numbness or tingling in the extremities. No change in bowel or bladder control.  MUSCULOSKELETAL: No muscle, back pain, joint pain or stiffness.  LYMPHATICS: No enlarged nodes. No history of splenectomy.  PSYCHIATRIC: No history of depression or anxiety.  ENDOCRINOLOGIC: No reports of sweating, cold or heat intolerance. No polyuria or polydipsia.  Marland Kitchen   Physical Examination Today's Vitals   07/08/20 1313  BP: 100/68  Pulse: 62  SpO2: (!) 87%  Weight: 197 lb (89.4 kg)  Height: 6\' 1"  (1.854 m)   Body mass index is 25.99 kg/m.  Gen: resting comfortably, no acute distress HEENT: no scleral icterus, pupils equal round and reactive, no palptable cervical adenopathy,  CV: RRR, no m/r/g, no jvd Resp: Clear to auscultation bilaterally GI: abdomen is soft, non-tender, non-distended, normal bowel sounds, no hepatosplenomegaly MSK: extremities are warm, no edema.  Skin: warm, no rash Psych: appropriate affect   Diagnostic Studies  09/2010 cath CONCLUSION:  Nonobstructive mild coronary plaque.  No further cardiac workup is suggested.  The patient will follow up with Dr. 10/2010 for evaluation of nonanginal chest pain  06/2020 echo [facility] Summary  1. The left ventricle is normal in size with upper normal wall thickness.  2. The left ventricular systolic function is normal, LVEF is  visually  estimated at 60-65%.  3. The left atrium is mildly dilated in size.  4. The right ventricle is normal in size, with normal systolic function.     Assessment and Plan  1. CAD - nonobstructive disease by prior cath - no recent cardiac symptoms - continue medical therapy. Of note he is on plavix for stroke not for a cardiac indication  2. Hyperlipidemia - repeat lipid panel. Has been on statin and fenofibrate, very high TGs last check        [facility], M.D

## 2020-07-25 ENCOUNTER — Other Ambulatory Visit: Payer: Self-pay

## 2020-07-25 ENCOUNTER — Inpatient Hospital Stay (HOSPITAL_COMMUNITY)
Admission: EM | Admit: 2020-07-25 | Discharge: 2020-07-29 | DRG: 917 | Disposition: A | Discharge status: Home / Self Care | Payer: Medicaid Other | Attending: Internal Medicine | Admitting: Internal Medicine

## 2020-07-25 ENCOUNTER — Inpatient Hospital Stay (HOSPITAL_COMMUNITY): Payer: Medicaid Other

## 2020-07-25 ENCOUNTER — Emergency Department (HOSPITAL_COMMUNITY): Payer: Medicaid Other

## 2020-07-25 DIAGNOSIS — I6389 Other cerebral infarction: Secondary | ICD-10-CM | POA: Diagnosis not present

## 2020-07-25 DIAGNOSIS — E785 Hyperlipidemia, unspecified: Secondary | ICD-10-CM | POA: Diagnosis present

## 2020-07-25 DIAGNOSIS — J449 Chronic obstructive pulmonary disease, unspecified: Secondary | ICD-10-CM | POA: Diagnosis present

## 2020-07-25 DIAGNOSIS — I639 Cerebral infarction, unspecified: Secondary | ICD-10-CM | POA: Diagnosis present

## 2020-07-25 DIAGNOSIS — Z825 Family history of asthma and other chronic lower respiratory diseases: Secondary | ICD-10-CM | POA: Diagnosis not present

## 2020-07-25 DIAGNOSIS — F1410 Cocaine abuse, uncomplicated: Secondary | ICD-10-CM | POA: Diagnosis not present

## 2020-07-25 DIAGNOSIS — Z7951 Long term (current) use of inhaled steroids: Secondary | ICD-10-CM

## 2020-07-25 DIAGNOSIS — R4781 Slurred speech: Secondary | ICD-10-CM | POA: Diagnosis present

## 2020-07-25 DIAGNOSIS — Z7984 Long term (current) use of oral hypoglycemic drugs: Secondary | ICD-10-CM

## 2020-07-25 DIAGNOSIS — E781 Pure hyperglyceridemia: Secondary | ICD-10-CM | POA: Diagnosis present

## 2020-07-25 DIAGNOSIS — I10 Essential (primary) hypertension: Secondary | ICD-10-CM | POA: Diagnosis present

## 2020-07-25 DIAGNOSIS — G40909 Epilepsy, unspecified, not intractable, without status epilepticus: Secondary | ICD-10-CM | POA: Diagnosis present

## 2020-07-25 DIAGNOSIS — Z20822 Contact with and (suspected) exposure to covid-19: Secondary | ICD-10-CM | POA: Diagnosis present

## 2020-07-25 DIAGNOSIS — K219 Gastro-esophageal reflux disease without esophagitis: Secondary | ICD-10-CM | POA: Diagnosis present

## 2020-07-25 DIAGNOSIS — Z794 Long term (current) use of insulin: Secondary | ICD-10-CM

## 2020-07-25 DIAGNOSIS — Z8249 Family history of ischemic heart disease and other diseases of the circulatory system: Secondary | ICD-10-CM | POA: Diagnosis not present

## 2020-07-25 DIAGNOSIS — E1140 Type 2 diabetes mellitus with diabetic neuropathy, unspecified: Secondary | ICD-10-CM | POA: Diagnosis not present

## 2020-07-25 DIAGNOSIS — Z7982 Long term (current) use of aspirin: Secondary | ICD-10-CM | POA: Diagnosis not present

## 2020-07-25 DIAGNOSIS — IMO0002 Reserved for concepts with insufficient information to code with codable children: Secondary | ICD-10-CM

## 2020-07-25 DIAGNOSIS — I2583 Coronary atherosclerosis due to lipid rich plaque: Secondary | ICD-10-CM | POA: Diagnosis not present

## 2020-07-25 DIAGNOSIS — R1314 Dysphagia, pharyngoesophageal phase: Secondary | ICD-10-CM | POA: Diagnosis not present

## 2020-07-25 DIAGNOSIS — Z8673 Personal history of transient ischemic attack (TIA), and cerebral infarction without residual deficits: Secondary | ICD-10-CM | POA: Diagnosis not present

## 2020-07-25 DIAGNOSIS — J441 Chronic obstructive pulmonary disease with (acute) exacerbation: Secondary | ICD-10-CM | POA: Diagnosis not present

## 2020-07-25 DIAGNOSIS — Z7902 Long term (current) use of antithrombotics/antiplatelets: Secondary | ICD-10-CM

## 2020-07-25 DIAGNOSIS — Z79899 Other long term (current) drug therapy: Secondary | ICD-10-CM

## 2020-07-25 DIAGNOSIS — R1312 Dysphagia, oropharyngeal phase: Secondary | ICD-10-CM | POA: Diagnosis present

## 2020-07-25 DIAGNOSIS — F17210 Nicotine dependence, cigarettes, uncomplicated: Secondary | ICD-10-CM | POA: Diagnosis present

## 2020-07-25 DIAGNOSIS — T405X1A Poisoning by cocaine, accidental (unintentional), initial encounter: Secondary | ICD-10-CM | POA: Diagnosis present

## 2020-07-25 DIAGNOSIS — G8194 Hemiplegia, unspecified affecting left nondominant side: Secondary | ICD-10-CM | POA: Diagnosis present

## 2020-07-25 DIAGNOSIS — R29707 NIHSS score 7: Secondary | ICD-10-CM | POA: Diagnosis present

## 2020-07-25 DIAGNOSIS — E0842 Diabetes mellitus due to underlying condition with diabetic polyneuropathy: Secondary | ICD-10-CM | POA: Diagnosis present

## 2020-07-25 DIAGNOSIS — E1142 Type 2 diabetes mellitus with diabetic polyneuropathy: Secondary | ICD-10-CM | POA: Diagnosis present

## 2020-07-25 DIAGNOSIS — R1310 Dysphagia, unspecified: Secondary | ICD-10-CM

## 2020-07-25 DIAGNOSIS — I2510 Atherosclerotic heart disease of native coronary artery without angina pectoris: Secondary | ICD-10-CM | POA: Diagnosis present

## 2020-07-25 DIAGNOSIS — E1165 Type 2 diabetes mellitus with hyperglycemia: Secondary | ICD-10-CM

## 2020-07-25 LAB — CBC WITH DIFFERENTIAL/PLATELET
Abs Immature Granulocytes: 0.04 10*3/uL (ref 0.00–0.07)
Basophils Absolute: 0.1 10*3/uL (ref 0.0–0.1)
Basophils Relative: 1 %
Eosinophils Absolute: 0.2 10*3/uL (ref 0.0–0.5)
Eosinophils Relative: 2 %
HCT: 38.8 % — ABNORMAL LOW (ref 39.0–52.0)
Hemoglobin: 12.6 g/dL — ABNORMAL LOW (ref 13.0–17.0)
Immature Granulocytes: 0 %
Lymphocytes Relative: 14 %
Lymphs Abs: 1.8 10*3/uL (ref 0.7–4.0)
MCH: 33.3 pg (ref 26.0–34.0)
MCHC: 32.5 g/dL (ref 30.0–36.0)
MCV: 102.6 fL — ABNORMAL HIGH (ref 80.0–100.0)
Monocytes Absolute: 0.9 10*3/uL (ref 0.1–1.0)
Monocytes Relative: 7 %
Neutro Abs: 9.9 10*3/uL — ABNORMAL HIGH (ref 1.7–7.7)
Neutrophils Relative %: 76 %
Platelets: 260 10*3/uL (ref 150–400)
RBC: 3.78 MIL/uL — ABNORMAL LOW (ref 4.22–5.81)
RDW: 12.9 % (ref 11.5–15.5)
WBC: 12.9 10*3/uL — ABNORMAL HIGH (ref 4.0–10.5)
nRBC: 0 % (ref 0.0–0.2)

## 2020-07-25 LAB — RAPID URINE DRUG SCREEN, HOSP PERFORMED
Amphetamines: NOT DETECTED
Barbiturates: NOT DETECTED
Benzodiazepines: NOT DETECTED
Cocaine: POSITIVE — AB
Opiates: NOT DETECTED
Tetrahydrocannabinol: NOT DETECTED

## 2020-07-25 LAB — BASIC METABOLIC PANEL
Anion gap: 5 (ref 5–15)
BUN: 19 mg/dL (ref 6–20)
CO2: 31 mmol/L (ref 22–32)
Calcium: 9.8 mg/dL (ref 8.9–10.3)
Chloride: 108 mmol/L (ref 98–111)
Creatinine, Ser: 0.97 mg/dL (ref 0.61–1.24)
GFR, Estimated: >60 mL/min (ref >60)
Glucose, Bld: 126 mg/dL — ABNORMAL HIGH (ref 70–99)
Potassium: 4.8 mmol/L (ref 3.5–5.1)
Sodium: 144 mmol/L (ref 135–145)

## 2020-07-25 LAB — AMMONIA: Ammonia: 23 umol/L (ref 9–35)

## 2020-07-25 LAB — ETHANOL: Alcohol, Ethyl (B): <10 mg/dL (ref <10)

## 2020-07-25 LAB — GLUCOSE, CAPILLARY: Glucose-Capillary: 167 mg/dL — ABNORMAL HIGH (ref 70–99)

## 2020-07-25 IMAGING — MR MR MRA HEAD W/O CM
1 series · 48 of 48 positions shown · non-contrast
Comparison: MRI earlier today.

CLINICAL DATA: Follow-up strokes in both hemispheres.

EXAM:
MRA HEAD WITHOUT CONTRAST
TECHNIQUE: Angiographic images of the Circle of Willis were acquired using MRA
technique without intravenous contrast.

[Series 100: TOF · axial · 0.5mm · 0.76mm/px · z∈[-51,+27]mm · 48 of 168 slices shown]
[im 1/168]
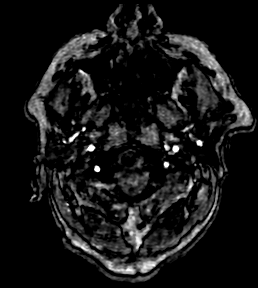
[im 4/168]
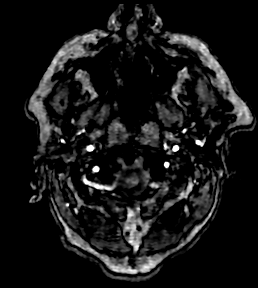
[im 8/168]
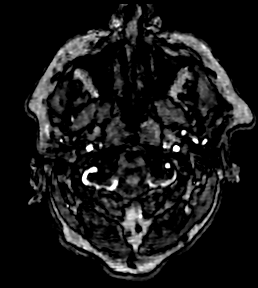
[im 11/168]
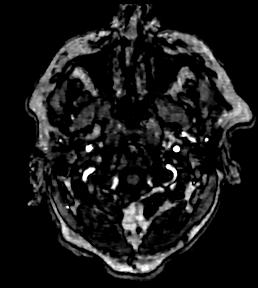
[im 15/168]
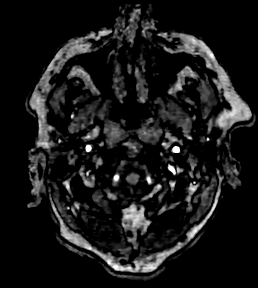
[im 18/168]
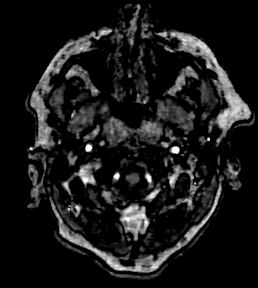
[im 22/168]
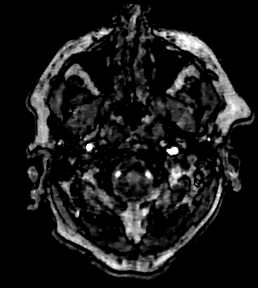
[im 25/168]
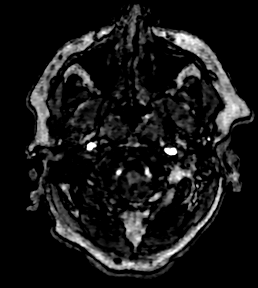
[im 29/168]
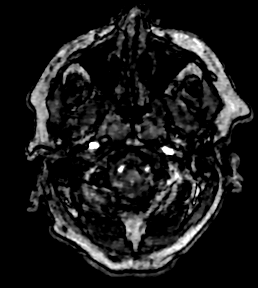
[im 32/168]
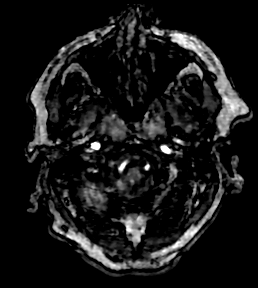
[im 36/168]
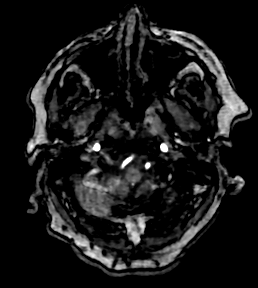
[im 40/168]
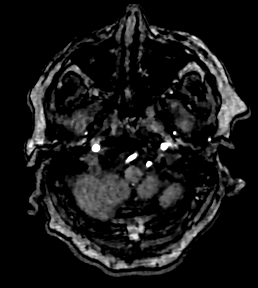
[im 43/168]
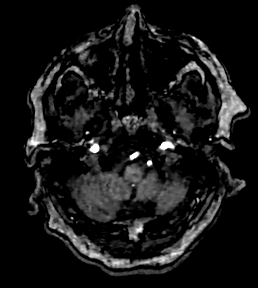
[im 47/168]
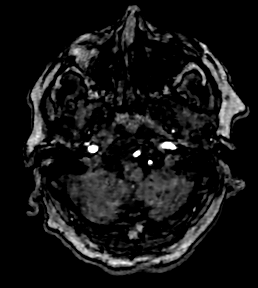
[im 50/168]
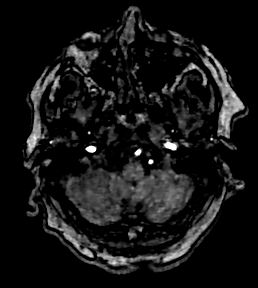
[im 54/168]
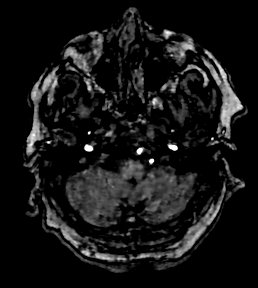
[im 57/168]
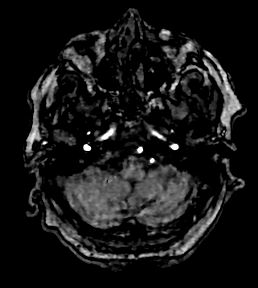
[im 61/168]
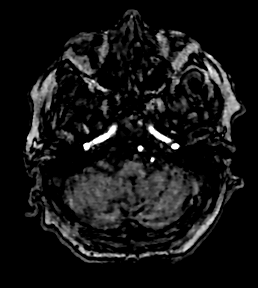
[im 64/168]
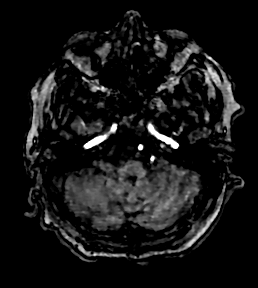
[im 68/168]
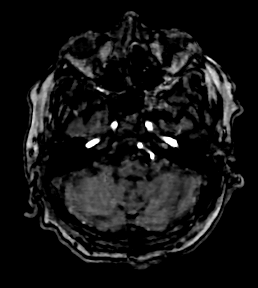
[im 72/168]
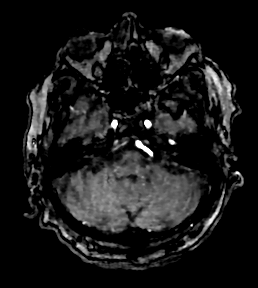
[im 75/168]
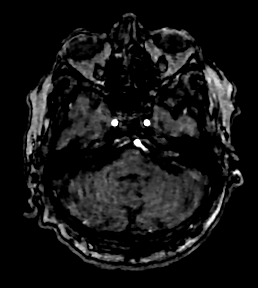
[im 79/168]
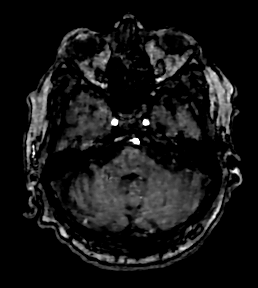
[im 82/168]
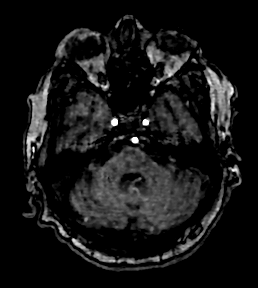
[im 86/168]
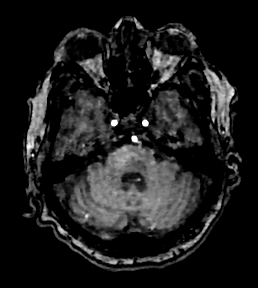
[im 89/168]
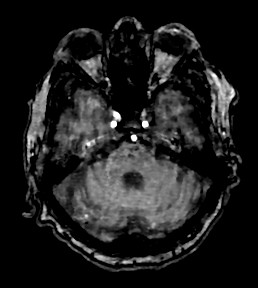
[im 93/168]
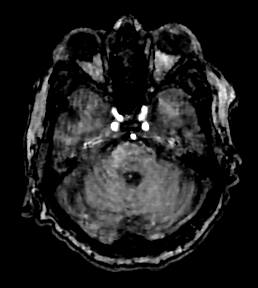
[im 96/168]
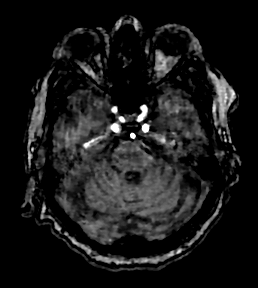
[im 100/168]
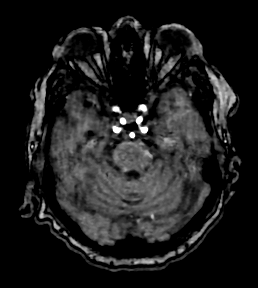
[im 104/168]
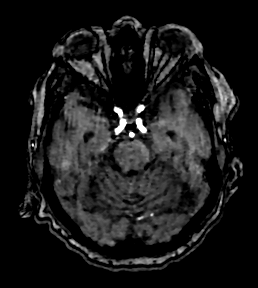
[im 107/168]
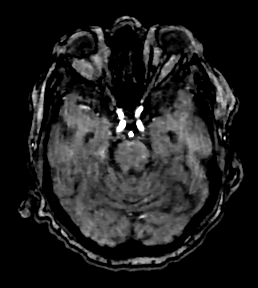
[im 111/168]
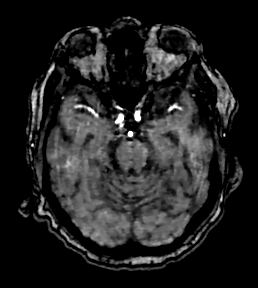
[im 114/168]
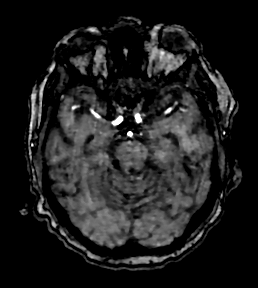
[im 118/168]
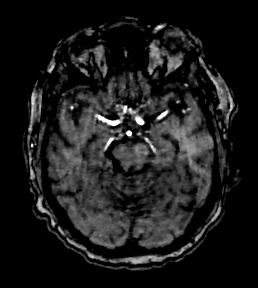
[im 121/168]
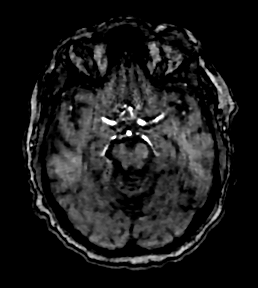
[im 125/168]
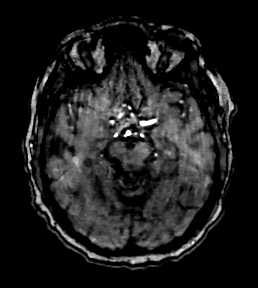
[im 128/168]
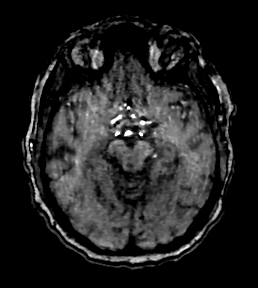
[im 132/168]
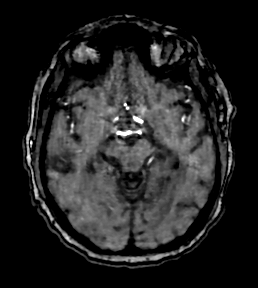
[im 136/168]
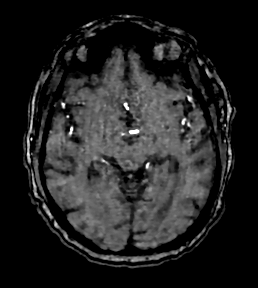
[im 139/168]
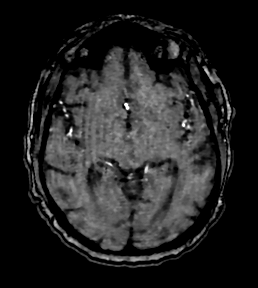
[im 143/168]
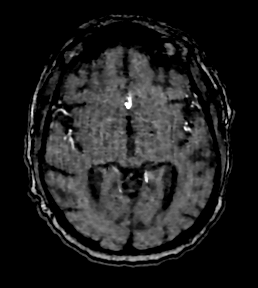
[im 146/168]
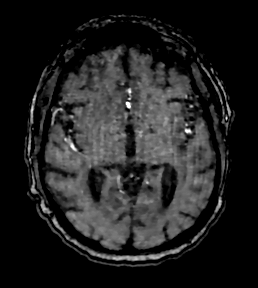
[im 150/168]
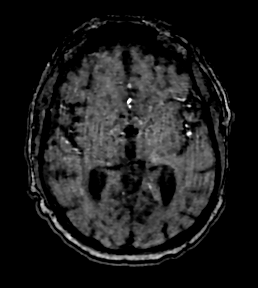
[im 153/168]
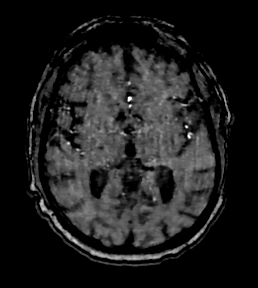
[im 157/168]
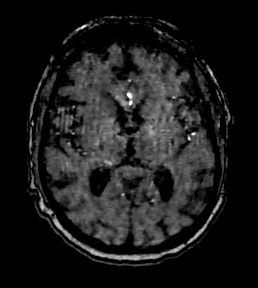
[im 160/168]
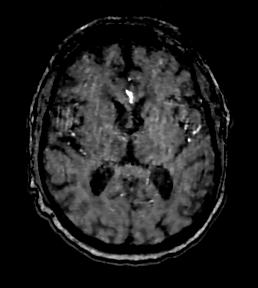
[im 164/168]
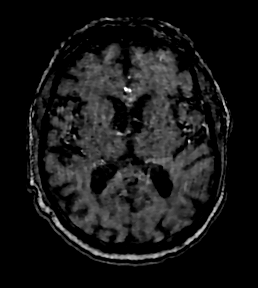
[im 168/168]
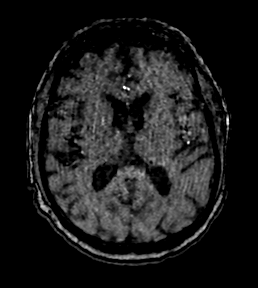

[48 of 48 positions shown; findings below may reference images not displayed]

FINDINGS: Anterior circulation: Both internal carotid arteries widely patent
through the skull base and siphon regions. The anterior and middle
cerebral vessels show flow without proximal stenosis. More distal
vessels poorly seen because of motion degradation.

Posterior circulation: Both vertebral arteries widely patent to the
basilar. No basilar stenosis. Flow present in both superior
cerebellar and posterior cerebral arteries. Distal branch vessels
poorly seen because of motion.

Anatomic variants: None significant.

Other: None.
IMPRESSION: Motion degraded study. No large vessel occlusion or correctable
proximal stenosis.

## 2020-07-25 IMAGING — MR MR HEAD W/O CM
9 of 10 series · 39 of 48 positions shown · non-contrast
Comparison: [DATE].

CLINICAL DATA: Neuro deficit, acute stroke suspected.

EXAM:
MRI HEAD WITHOUT CONTRAST
TECHNIQUE: Multiplanar, multiecho pulse sequences of the brain and surrounding
structures were obtained without intravenous contrast.

[Series 5: DWI · axial · 4.0mm · 0.88mm/px · z∈[-40,+99]mm · 5 of 36 slices shown (1 of 4)]
[im 1/36]
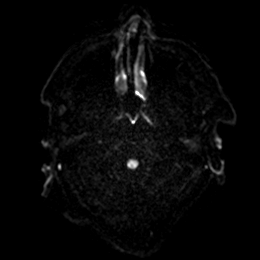
[im 9/36]
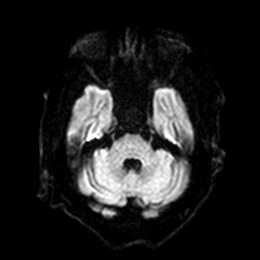
[im 18/36]
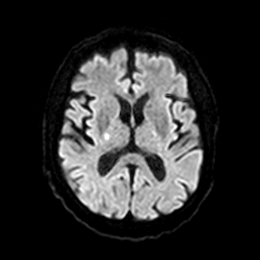
[im 27/36]
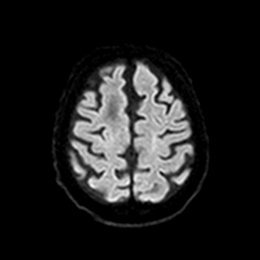
[im 36/36]
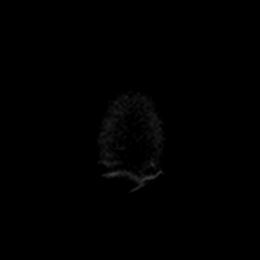

[Series 6: DWI · axial · 4.0mm · 0.88mm/px · z∈[-40,+99]mm · 5 of 36 slices shown (2 of 4)]
[im 1/36]
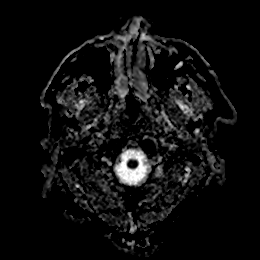
[im 9/36]
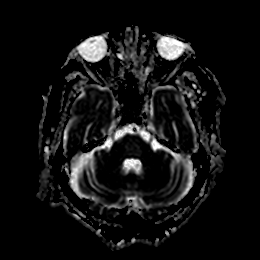
[im 18/36]
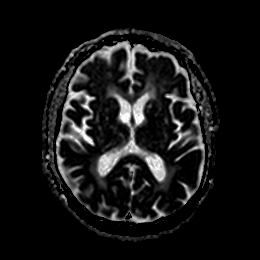
[im 27/36]
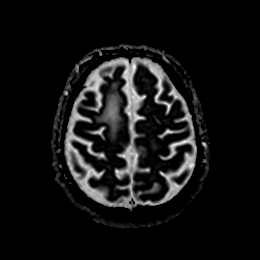
[im 36/36]
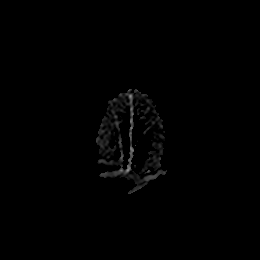

[Series 7: DWI · coronal · 4.0mm · 0.88mm/px · 5 of 32 slices shown (3 of 4)]
[im 1/32]
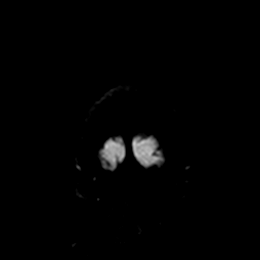
[im 8/32]
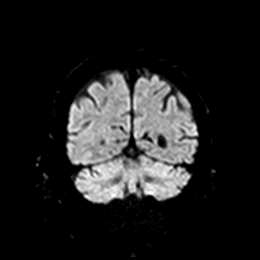
[im 16/32]
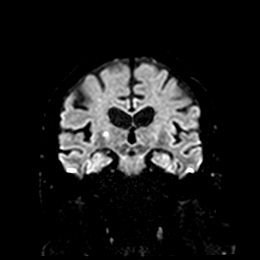
[im 24/32]
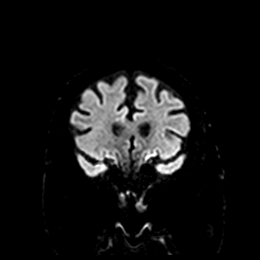
[im 32/32]
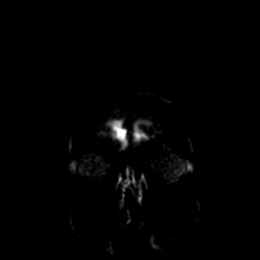

[Series 8: DWI · coronal · 4.0mm · 0.88mm/px · 5 of 32 slices shown (4 of 4)]
[im 1/32]
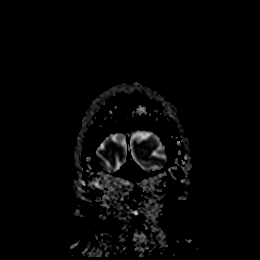
[im 8/32]
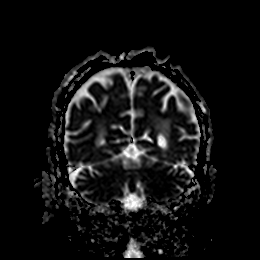
[im 16/32]
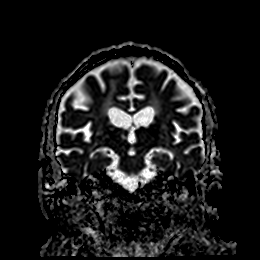
[im 24/32]
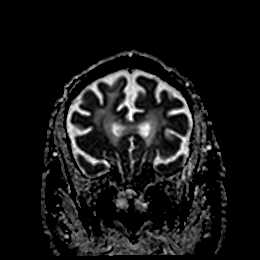
[im 32/32]
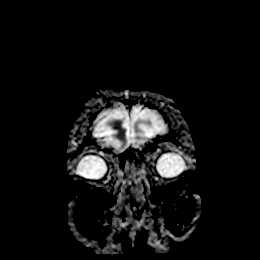

[Series 9: T1 · sagittal · 5.0mm · 0.80mm/px · 3 of 23 slices shown]
[im 1/23]
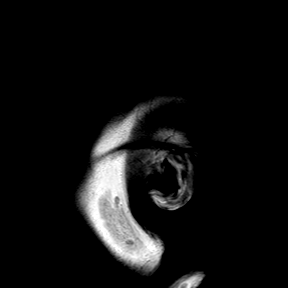
[im 12/23]
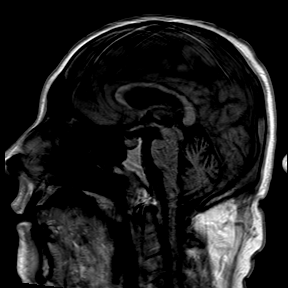
[im 23/23]
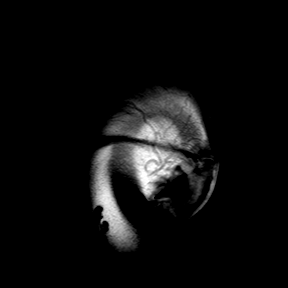

[Series 10: T2 · axial · 5.0mm · 0.72mm/px · z∈[-47,+106]mm · 3 of 23 slices shown (1 of 2)]
[im 1/23]
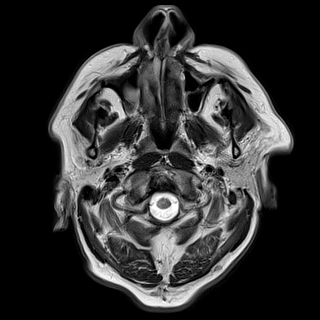
[im 12/23]
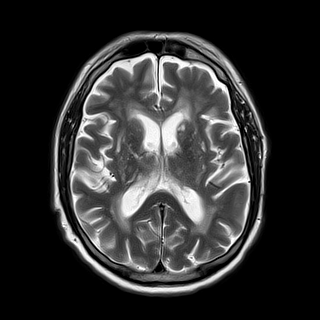
[im 23/23]
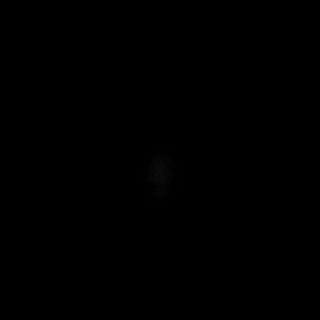

[Series 11: ax hemo · axial · 5.0mm · 0.86mm/px · z∈[-44,+99]mm · 4 of 25 slices shown]
[im 1/25]
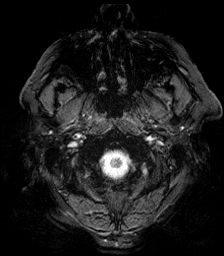
[im 9/25]
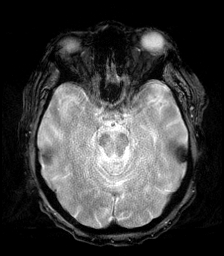
[im 17/25]
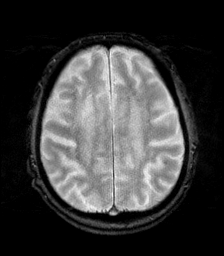
[im 25/25]
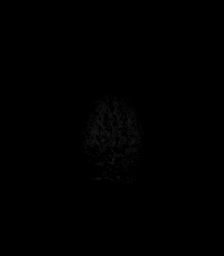

[Series 12: FLAIR · axial · 4.0mm · 0.43mm/px · z∈[-46,+101]mm · 5 of 38 slices shown]
[im 1/38]
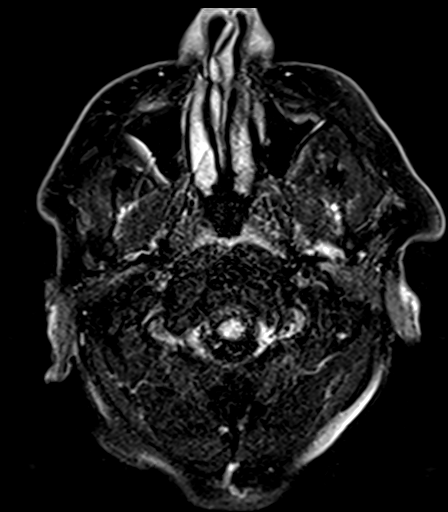
[im 10/38]
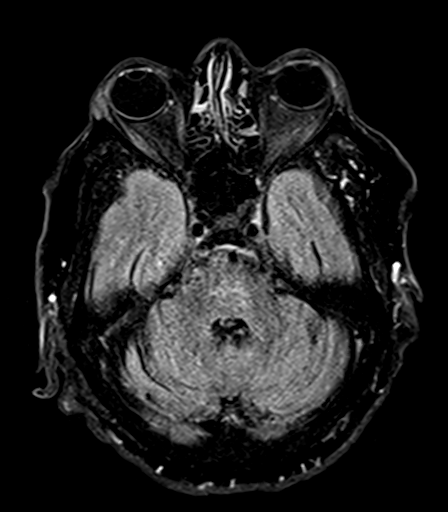
[im 19/38]
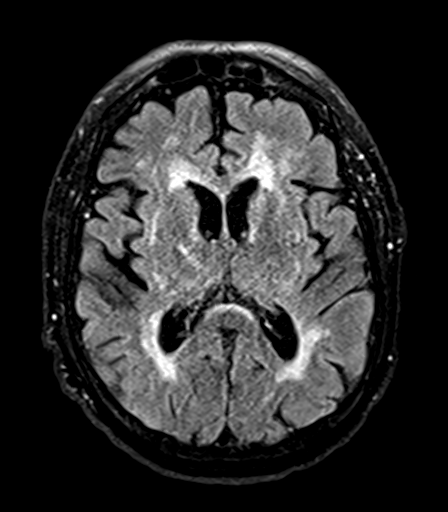
[im 28/38]
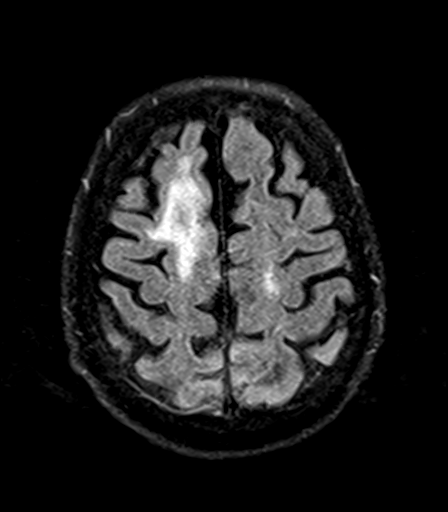
[im 38/38]
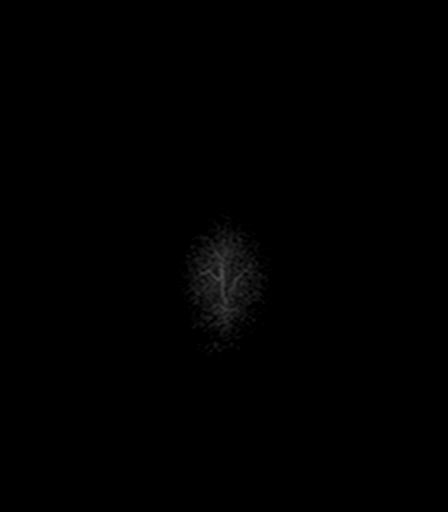

[Series 14: T2 · coronal · 5.0mm · 0.72mm/px · 4 of 28 slices shown (2 of 2)]
[im 1/28]
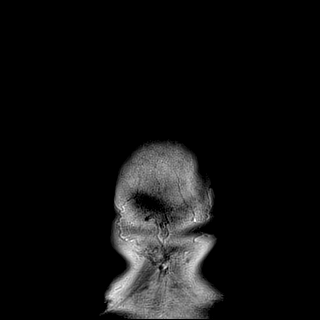
[im 10/28]
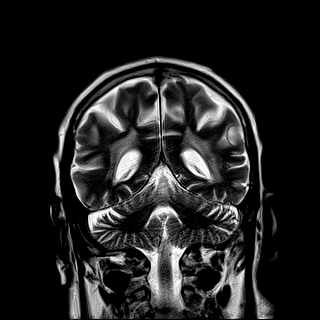
[im 19/28]
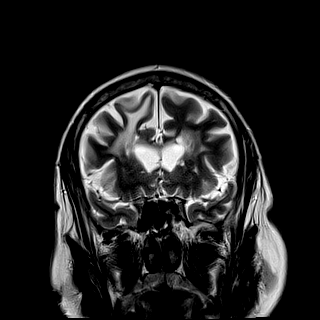
[im 28/28]
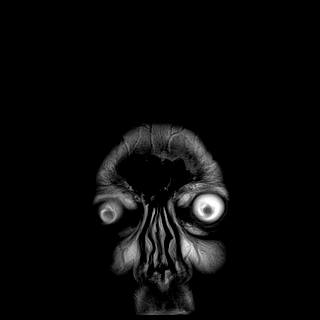

[39 of 48 positions shown; findings below may reference images not displayed]

FINDINGS: Motion limited evaluation.  Within this limitation:

Brain: Evolving infarcts in the posterior limb of the right internal
capsule and left basal ganglia with mild edema and decreased
conspicuity of diffusion signal abnormality. No evidence of
new/interval acute infarct. Remote right frontal infarct with
encephalomalacia. Additional scattered T2/FLAIR hyperintensities
within the white matter, compatible with advanced chronic
microvascular ischemic disease. Similar atrophy with ex vacuo
ventricular dilation. No hydrocephalus. No extra-axial fluid
collection. No mass lesion or abnormal mass effect.

Vascular: Major arterial flow voids are maintained at the skull
base.

Skull and upper cervical spine: Normal marrow signal.

Sinuses/Orbits: Mild scattered paranasal sinus mucosal thickening.
Unremarkable orbits.

Other: No sizable mastoid effusions.  MRI
IMPRESSION: 1. Evolving infarcts in the posterior limb of the right internal
capsule and left basal ganglia with mild edema and decreased
conspicuity of diffusion signal abnormality. No evidence of
new/interval acute infarct on this motion limited exam.
2. Advanced chronic microvascular ischemic disease.

## 2020-07-25 MED ORDER — FENOFIBRATE 160 MG PO TABS
160.0000 mg | ORAL_TABLET | Freq: Every day | ORAL | Status: DC
  Administered 2020-07-26 – 2020-07-29 (×4): 160 mg via ORAL
  Filled 2020-07-25 (×4): qty 1

## 2020-07-25 MED ORDER — ACETAMINOPHEN 325 MG PO TABS
650.0000 mg | ORAL_TABLET | ORAL | Status: DC | PRN
  Administered 2020-07-28 – 2020-07-29 (×3): 650 mg via ORAL
  Filled 2020-07-25 (×3): qty 2

## 2020-07-25 MED ORDER — ALBUTEROL SULFATE (2.5 MG/3ML) 0.083% IN NEBU
2.5000 mg | INHALATION_SOLUTION | RESPIRATORY_TRACT | Status: DC | PRN
  Administered 2020-07-27: 2.5 mg via RESPIRATORY_TRACT
  Filled 2020-07-25: qty 3

## 2020-07-25 MED ORDER — ASPIRIN EC 81 MG PO TBEC
81.0000 mg | DELAYED_RELEASE_TABLET | Freq: Every day | ORAL | Status: DC
  Administered 2020-07-26 – 2020-07-29 (×4): 81 mg via ORAL
  Filled 2020-07-25 (×4): qty 1

## 2020-07-25 MED ORDER — ATORVASTATIN CALCIUM 40 MG PO TABS: 80.0000 mg | ORAL_TABLET | Freq: Every day | ORAL | Status: DC

## 2020-07-25 MED ORDER — SENNOSIDES-DOCUSATE SODIUM 8.6-50 MG PO TABS: 1.0000 | ORAL_TABLET | Freq: Every evening | ORAL | Status: DC | PRN

## 2020-07-25 MED ORDER — ASPIRIN 81 MG PO CHEW
243.0000 mg | CHEWABLE_TABLET | Freq: Once | ORAL | Status: AC
  Administered 2020-07-28: 243 mg via ORAL
  Filled 2020-07-25: qty 3

## 2020-07-25 MED ORDER — ACETAMINOPHEN 650 MG RE SUPP: 650.0000 mg | RECTAL | Status: DC | PRN

## 2020-07-25 MED ORDER — ACETAMINOPHEN 160 MG/5ML PO SOLN: 650.0000 mg | ORAL | Status: DC | PRN

## 2020-07-25 MED ORDER — CARBAMAZEPINE ER 100 MG PO TB12
400.0000 mg | ORAL_TABLET | Freq: Two times a day (BID) | ORAL | Status: DC
  Administered 2020-07-25 – 2020-07-29 (×8): 400 mg via ORAL
  Filled 2020-07-25: qty 4
  Filled 2020-07-25: qty 1
  Filled 2020-07-25 (×4): qty 4
  Filled 2020-07-25: qty 1
  Filled 2020-07-25 (×4): qty 4

## 2020-07-25 MED ORDER — FLUTICASONE FUROATE-VILANTEROL 200-25 MCG/INH IN AEPB
1.0000 | INHALATION_SPRAY | Freq: Every day | RESPIRATORY_TRACT | Status: DC
  Administered 2020-07-27 – 2020-07-29 (×3): 1 via RESPIRATORY_TRACT
  Filled 2020-07-25: qty 28

## 2020-07-25 MED ORDER — PANTOPRAZOLE SODIUM 40 MG PO TBEC
40.0000 mg | DELAYED_RELEASE_TABLET | Freq: Every day | ORAL | Status: DC
  Administered 2020-07-26 – 2020-07-29 (×4): 40 mg via ORAL
  Filled 2020-07-25 (×4): qty 1

## 2020-07-25 MED ORDER — INSULIN ASPART 100 UNIT/ML IJ SOLN
0.0000 [IU] | Freq: Three times a day (TID) | INTRAMUSCULAR | Status: DC
  Administered 2020-07-26 (×2): 2 [IU] via SUBCUTANEOUS
  Administered 2020-07-26 – 2020-07-27 (×2): 3 [IU] via SUBCUTANEOUS
  Administered 2020-07-27: 5 [IU] via SUBCUTANEOUS
  Administered 2020-07-28 (×3): 3 [IU] via SUBCUTANEOUS
  Administered 2020-07-29: 2 [IU] via SUBCUTANEOUS
  Administered 2020-07-29: 3 [IU] via SUBCUTANEOUS

## 2020-07-25 MED ORDER — SODIUM CHLORIDE 0.9 % IV SOLN: INTRAVENOUS | Status: DC

## 2020-07-25 MED ORDER — ASPIRIN 81 MG PO CHEW: 243.0000 mg | CHEWABLE_TABLET | Freq: Every day | ORAL | Status: DC

## 2020-07-25 MED ORDER — ENOXAPARIN SODIUM 40 MG/0.4ML IJ SOSY
40.0000 mg | PREFILLED_SYRINGE | INTRAMUSCULAR | Status: DC
  Administered 2020-07-26 – 2020-07-28 (×3): 40 mg via SUBCUTANEOUS
  Filled 2020-07-25 (×2): qty 0.4

## 2020-07-25 MED ORDER — INSULIN ASPART 100 UNIT/ML IJ SOLN: 0.0000 [IU] | Freq: Every day | INTRAMUSCULAR | Status: DC

## 2020-07-25 MED ORDER — STROKE: EARLY STAGES OF RECOVERY BOOK
Freq: Once | Status: AC
  Filled 2020-07-25: qty 1

## 2020-07-25 MED ORDER — CLOPIDOGREL BISULFATE 75 MG PO TABS
75.0000 mg | ORAL_TABLET | Freq: Every day | ORAL | Status: DC
  Administered 2020-07-26 – 2020-07-29 (×4): 75 mg via ORAL
  Filled 2020-07-25 (×4): qty 1

## 2020-07-25 NOTE — ED Notes (Signed)
Patient back from MRI - placed back on monitor

## 2020-07-25 NOTE — ED Notes (Signed)
Pt given diet gingerale and saltine crackers 

## 2020-07-25 NOTE — ED Triage Notes (Signed)
EMS states that pt's family is concerned that he is having another stroke pt has had 2 strokes in the past couple of weeks. Pt last known well time was 5pm yesterday 07/25/20.  Pt admits that he smoked crack yesterday with a friend.

## 2020-07-25 NOTE — H&P (Addendum)
TRH H&P   Patient Demographics:    Brent [name], is a 55 y.o. male  MRN: [id number]   DOB - 1965-05-30  Admit Date - 07/25/2020  Outpatient Primary MD for the patient is [name], [name], MD  Referring MD/NP/PA: Dr Wilkie Aye  Patient coming from: Home  Chief Complaint  Patient presents with   Cerebrovascular Accident      HPI:    Brent [name]  is a 55 y.o. 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, patient presents to ED due to concerns of slurred speech, and is very poor historian, no family at bedside, was unable to reach significant other by phone, history was mainly obtained by phone and ED staff, reportedly patient states around 5 PM yesterday his significant other noted his speech to be changing, his speech continued to be slurred this morning, so she wanted that to get checked out, patient reports he is feeling generally weak, but more in the left side, he denies any vision changes, any loss of consciousness, any confusion, any tingling, or numbness, patient endorses he smoked crack yesterday a friend, report he usually do it 1-2 times a week. -NAD MRI brain showing acute CVA, , ED physician discussed with Dr. Amada Jupiter from teleneurology, who suspect these findings are related to illicit drug use, and actually upon asking patient reports he did smoke crack, and his urine drug screen came positive for cocaine, there is no intervention needed, and patient can stay at [facility] for further work-up, [facility] hospitalist consulted to admit.    Review of systems:    In addition to the HPI above, No Fever-chills, No Headache, No changes with Vision or hearing, No problems swallowing food or Liquids, No Chest pain, Cough or Shortness of Breath, No Abdominal pain, No Nausea or Vommitting, Bowel movements are regular, No Blood  in stool or Urine, No dysuria, No new skin rashes or bruises, No new joints pains-aches,  Report weakness and speech difficulty No recent weight gain or loss, No polyuria, polydypsia or polyphagia, No significant Mental Stressors.  A full 10 point Review of Systems was done, except as stated above, all other Review of Systems were negative.   With Past History of the following :    Past Medical History:  Diagnosis Date   Aplastic anemia, unspecified (HCC)    Chest pain, unspecified    Chronic airway obstruction, not elsewhere classified    Cocaine substance abuse (HCC)    Neuropathy    Other and unspecified hyperlipidemia    Shortness of breath    Stroke (HCC) 06/17/2020   Tobacco use disorder    Type II or unspecified type diabetes mellitus without mention of complication, not stated as uncontrolled    Unspecified epilepsy without mention of intractable epilepsy    Unspecified essential hypertension  Past Surgical History:  Procedure Laterality Date   CARDIAC CATHETERIZATION  10/02/2010    Nonobstructive mild coronary plaque   KNEE SURGERY Bilateral 2018   Repair   PERFORATED VISCUS SURGERY     MULTIPLE FRACTURES      Social History:     Social History   Tobacco Use   Smoking status: Every Day    Packs/day: 1.00    Years: 30.00    Pack years: 30.00    Types: Cigarettes   Smokeless tobacco: Never  Substance Use Topics   Alcohol use: Yes    Comment: drinks beer on the weekends     Lives - at home  Mobility independent   Family History :     Family History  Problem Relation Age of Onset   Heart attack Father    COPD Father    Coronary artery disease Mother       Home Medications:   Prior to Admission medications   Medication Sig Start Date End Date Taking? Authorizing Provider  albuterol (PROVENTIL) (2.5 MG/3ML) 0.083% nebulizer solution Take 2.5 mg by nebulization every 4 (four) hours as needed for wheezing or shortness of breath.     [provider]  albuterol (VENTOLIN HFA) 108 (90 Base) MCG/ACT inhaler Inhale 2 puffs into the lungs every 6 (six) hours as needed for wheezing or shortness of breath.    [provider]  aspirin EC 81 MG tablet Take 81 mg by mouth daily. Swallow whole.    [provider]  atorvastatin (LIPITOR) 80 MG tablet Take 80 mg by mouth daily. 10/27/19   [provider]  benazepril (LOTENSIN) 10 MG tablet Take 10 mg by mouth daily.    [provider]  Budeson-Glycopyrrol-Formoterol (BREZTRI AEROSPHERE) 160-9-4.8 MCG/ACT AERO Inhale 2 puffs into the lungs in the morning and at bedtime. 07/18/19   Waymon Budge, MD  budesonide-formoterol (SYMBICORT) 160-4.5 MCG/ACT inhaler Inhale 2 puffs into the lungs in the morning and at bedtime.  06/29/18   [provider]  carbamazepine (TEGRETOL XR) 400 MG 12 hr tablet Take 1 tablet (400 mg total) by mouth 2 (two) times daily. 06/05/20   Van Clines, MD  clopidogrel (PLAVIX) 75 MG tablet Take 1 tablet by mouth daily. 06/20/20   [provider]  dextromethorphan-guaiFENesin (MUCINEX DM) 30-600 MG 12hr tablet Take 1 tablet by mouth 2 (two) times daily. 11/08/19   Swayze, Ava, DO  escitalopram (LEXAPRO) 10 MG tablet Take 10 mg by mouth daily.  06/01/18   [provider]  fenofibrate 160 MG tablet Take 160 mg by mouth daily.  12/11/16   [provider]  gabapentin (NEURONTIN) 300 MG capsule Take 3 caps in AM, 3 caps at noon, 4 caps at bedtime 06/05/20   Van Clines, MD  insulin glargine (LANTUS) 100 UNIT/ML injection Inject 40 Units into the skin at bedtime. Patient states that he takes 40 units in the morning and 40 units at night.    [provider]  metFORMIN (GLUCOPHAGE) 1000 MG tablet Take 1,000 mg by mouth 2 (two) times daily with a meal.    [provider]  Omega-3 Fatty Acids (FISH OIL) 1000 MG CAPS Take 1,000 mg by mouth 2 (two) times daily.    [provider]   omeprazole (PRILOSEC) 40 MG capsule Take 1 capsule (40 mg total) by mouth 2 (two) times daily. 03/21/19   Tawni Pummel B, PA-C  tiZANidine (ZANAFLEX) 4 MG tablet Take 4 mg  by mouth every 8 (eight) hours as needed for muscle spasms.  05/26/19   [provider]     Allergies:    No Known Allergies   Physical Exam:   Vitals  Blood pressure (!) 134/91, pulse 64, temperature 98 F (36.7 C), temperature source Oral, resp. rate (!) 22, height 6' (1.829 m), weight 127 kg, SpO2 96 %.   1. General well developed male, laying in bed, no apparent distress  2. Normal affect and insight, Not Suicidal or Homicidal, Awake Alert, Oriented X 3.  3.  Patient with slurred speech, no facial droop, weakness in left upper extremity, otherwise generalized weakness nonspecific.  4. Ears and Eyes appear Normal, Conjunctivae clear, PERRLA. Moist Oral Mucosa.  5. Supple Neck, No JVD, No cervical lymphadenopathy appriciated, No Carotid Bruits.  6. Symmetrical Chest wall movement, Good air movement bilaterally, CTAB.  7. RRR, No Gallops, Rubs or Murmurs, No Parasternal Heave.  8. Positive Bowel Sounds, Abdomen Soft, No tenderness, No organomegaly appriciated,No rebound -guarding or rigidity.  9.  No Cyanosis, Normal Skin Turgor, No Skin Rash or Bruise.  10. Good muscle tone,  joints appear normal , no effusions, Normal ROM.  11. No Palpable Lymph Nodes in Neck or Axillae    Data Review:    CBC Recent Labs  Lab 07/25/20 1307  WBC 12.9*  HGB 12.6*  HCT 38.8*  PLT 260  MCV 102.6*  MCH 33.3  MCHC 32.5  RDW 12.9  LYMPHSABS 1.8  MONOABS 0.9  EOSABS 0.2  BASOSABS 0.1   ------------------------------------------------------------------------------------------------------------------  Chemistries  Recent Labs  Lab 07/25/20 1307  NA 144  K 4.8  CL 108  CO2 31  GLUCOSE 126*  BUN 19  CREATININE 0.97  CALCIUM 9.8    ------------------------------------------------------------------------------------------------------------------ estimated creatinine clearance is 118.5 mL/min (by C-G formula based on SCr of 0.97 mg/dL). ------------------------------------------------------------------------------------------------------------------ No results for input(s): TSH, T4TOTAL, T3FREE, THYROIDAB in the last 72 hours.  Invalid input(s): FREET3  Coagulation profile No results for input(s): INR, PROTIME in the last 168 hours. ------------------------------------------------------------------------------------------------------------------- No results for input(s): DDIMER in the last 72 hours. -------------------------------------------------------------------------------------------------------------------  Cardiac Enzymes No results for input(s): CKMB, TROPONINI, MYOGLOBIN in the last 168 hours.  Invalid input(s): CK ------------------------------------------------------------------------------------------------------------------    Component Value Date/Time   BNP 107.0 (H) 11/03/2019 0918     ---------------------------------------------------------------------------------------------------------------  Urinalysis No results found for: COLORURINE, APPEARANCEUR, LABSPEC, PHURINE, GLUCOSEU, HGBUR, BILIRUBINUR, KETONESUR, PROTEINUR, UROBILINOGEN, NITRITE, LEUKOCYTESUR  ----------------------------------------------------------------------------------------------------------------   Imaging Results:    MR Brain Wo Contrast (neuro protocol)  Result Date: 07/25/2020 CLINICAL DATA:  Neuro deficit, acute stroke suspected. EXAM: MRI HEAD WITHOUT CONTRAST TECHNIQUE: Multiplanar, multiecho pulse sequences of the brain and surrounding structures were obtained without intravenous contrast. COMPARISON:  Jun 18, 2020. FINDINGS: Motion limited evaluation.  Within this limitation: Brain: Evolving infarcts in the  posterior limb of the right internal capsule and left basal ganglia with mild edema and decreased conspicuity of diffusion signal abnormality. No evidence of new/interval acute infarct. Remote right frontal infarct with encephalomalacia. Additional scattered T2/FLAIR hyperintensities within the white matter, compatible with advanced chronic microvascular ischemic disease. Similar atrophy with ex vacuo ventricular dilation. No hydrocephalus. No extra-axial fluid collection. No mass lesion or abnormal mass effect. Vascular: Major arterial flow voids are maintained at the skull base. Skull and upper cervical spine: Normal marrow signal. Sinuses/Orbits: Mild scattered paranasal sinus mucosal thickening. Unremarkable orbits. Other: No sizable mastoid effusions.  MRI IMPRESSION: 1. Evolving infarcts in the posterior limb of the right  internal capsule and left basal ganglia with mild edema and decreased conspicuity of diffusion signal abnormality. No evidence of new/interval acute infarct on this motion limited exam. 2. Advanced chronic microvascular ischemic disease. Electronically Signed   By: Feliberto Harts MD   On: 07/25/2020 14:14    My personal review of EKG: Rhythm NSR, Vent. rate 71 BPM PR interval 193 ms QRS duration 90 ms QT/QTcB 388/422 ms P-R-T axes 83 57 93 Sinus rhythm Nonspecific T abnormalities, lateral leads   Assessment & Plan:    Active Problems:   CAD (coronary artery disease)   DM II (diabetes mellitus, type II), controlled (HCC)   Diabetic polyneuropathy associated with diabetes mellitus due to underlying condition (HCC)   COPD with acute exacerbation (HCC)   Acute CVA (cerebrovascular accident) (HCC)    Acute CVA -This is most likely in the setting of cocaine abuse, as well patient having multiple risk factors including CAD, diabetes mellitus, tobacco abuse and hypertension. -Current distribution most likely related to cocaine use. -Patient report he took his aspirin and  Plavix this morning, which will be continued, I will give another 3 baby aspirin to reach total of full dose aspirin . - he is admitted under acute CVA pathway, will check 2D echo, will monitor him on telemetry, will check A1c, and lipid panel, will obtain MRA head, and carotid Dopplers. -We will consult PT/OT/SLP, meanwhile we will have RN to evaluate for bedside screen evaluation to resume home meds and dysphagia 1 diet.  Hyperlipidemia - Will check lipid panel, but will continue home medication  History of CAD -Continue with aspirin, Plavix and statin  Hypertension -We will hold home medications and allow for permissive hypertension  Diabetes mellitus type 2, insulin-dependent -We will check A1c, hold metformin, resume Lantus at a lower dose once dose confirmed, will add insulin sliding scale  COPD -No active wheezing, continue with home medications, will add as needed albuterol.  Tobacco abuse -He was counseled we will start on nicotine patch  Substance abuse -Patient endorsed smoking crack cocaine, he was counseled.  DVT Prophylaxis  Lovenox   AM Labs Ordered, also please review Full Orders  Family Communication: Admission, patients condition and plan of care including tests being ordered have been discussed with the patient that I have tried to communicate with his significant other, unable to recheck.  Who indicate understanding and agree with the plan and Code Status.  Code Status full  Likely DC to  home  Condition GUARDED   Consults called: Neurology consult requested in epic  Admission status: inpatient    Time spent in minutes : 60 minutes   Huey Bienenstock M.D on 07/25/2020 at 3:42 PM   [facility] - Office  [phone]

## 2020-07-25 NOTE — ED Provider Notes (Signed)
[facility] EMERGENCY DEPARTMENT Provider Note   CSN: [id number] Arrival date & time: 07/25/20  1122     History Chief Complaint  Patient presents with   Cerebrovascular Accident    Brent [name] is a 55 y.o. male.  HPI  55 year old male with past medical history of HTN, HLD, DM, previous CVA with no reported deficits presents emergency department concern for slurred speech.  Patient is a poor historian, no family at bedside.  He reportedly states that around 4:57 PM yesterday his significant other thought that his speech changed.  His speech continues to be slurred this morning so she wanted him to get checked out.  He feels weak on the left side but denies any vision changes, aphasia, numbness, neglect.  No recent fever or illness.  Past Medical History:  Diagnosis Date   Aplastic anemia, unspecified (HCC)    Chest pain, unspecified    Chronic airway obstruction, not elsewhere classified    Cocaine substance abuse (HCC)    Neuropathy    Other and unspecified hyperlipidemia    Shortness of breath    Stroke (HCC) 06/17/2020   Tobacco use disorder    Type II or unspecified type diabetes mellitus without mention of complication, not stated as uncontrolled    Unspecified epilepsy without mention of intractable epilepsy    Unspecified essential hypertension     Patient Active Problem List   Diagnosis Date Noted   Acute on chronic respiratory failure with hypoxia (HCC) 11/04/2019   Rhinovirus infection 11/04/2019   Essential hypertension 11/04/2019   CAP (community acquired pneumonia) 11/03/2019   Diabetic polyneuropathy associated with diabetes mellitus due to underlying condition (HCC) 02/06/2019   Localization-related (focal) (partial) symptomatic epilepsy and epileptic syndromes with complex partial seizures, intractable, without status epilepticus (HCC) 02/06/2019   Hyperlipidemia associated with type 2 diabetes mellitus (HCC) 11/26/2018   Cough 11/03/2018    Obstructive sleep apnea 11/03/2018   Dysphagia 09/27/2018   Eczema 09/27/2018   Right leg weakness 01/19/2018   History of arthroscopy of knee 11/15/2017   Complex tear of lateral meniscus of right knee as current injury 10/27/2017   Effusion of right knee 07/26/2017   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   Cellulitis 07/22/2017   Primary osteoarthritis involving multiple joints 06/22/2017   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 02/01/2017   Right knee pain 02/01/2017   Acute bronchitis 12/07/2016   Acute idiopathic gout of left foot 12/07/2016   Anxiety, generalized 12/07/2016   COPD with acute exacerbation (HCC) 12/07/2016   Seizures (HCC) 12/07/2016   CAD (coronary artery disease) 12/19/2010   DM II (diabetes mellitus, type II), controlled (HCC) 12/19/2010   Hypertension associated with diabetes (HCC) 12/19/2010   GERD (gastroesophageal reflux disease) 12/19/2010   Hypertriglyceridemia 12/19/2010   Tobacco abuse 12/19/2010    Past Surgical History:  Procedure Laterality Date   CARDIAC CATHETERIZATION  10/02/2010    Nonobstructive mild coronary plaque   KNEE SURGERY Bilateral 2018   Repair   PERFORATED VISCUS SURGERY     MULTIPLE FRACTURES       Family History  Problem Relation Age of Onset   Heart attack Father    COPD Father    Coronary artery disease Mother     Social History   Tobacco Use   Smoking status: Every Day    Packs/day: 1.00    Years: 30.00    Pack years: 30.00    Types: Cigarettes   Smokeless tobacco:  Never  Substance Use Topics   Alcohol use: Yes    Comment: drinks beer on the weekends   Drug use: Never    Home Medications Prior to Admission medications   Medication Sig Start Date End Date Taking? Authorizing Provider  albuterol (PROVENTIL) (2.5 MG/3ML) 0.083% nebulizer solution Take 2.5 mg by nebulization every 4 (four) hours as needed for wheezing or shortness of breath.    [provider]   albuterol (VENTOLIN HFA) 108 (90 Base) MCG/ACT inhaler Inhale 2 puffs into the lungs every 6 (six) hours as needed for wheezing or shortness of breath.    [provider]  aspirin EC 81 MG tablet Take 81 mg by mouth daily. Swallow whole.    [provider]  atorvastatin (LIPITOR) 80 MG tablet Take 80 mg by mouth daily. 10/27/19   [provider]  benazepril (LOTENSIN) 10 MG tablet Take 10 mg by mouth daily.    [provider]  Budeson-Glycopyrrol-Formoterol (BREZTRI AEROSPHERE) 160-9-4.8 MCG/ACT AERO Inhale 2 puffs into the lungs in the morning and at bedtime. 07/18/19   Waymon Budge, MD  budesonide-formoterol (SYMBICORT) 160-4.5 MCG/ACT inhaler Inhale 2 puffs into the lungs in the morning and at bedtime.  06/29/18   [provider]  carbamazepine (TEGRETOL XR) 400 MG 12 hr tablet Take 1 tablet (400 mg total) by mouth 2 (two) times daily. 06/05/20   Van Clines, MD  clopidogrel (PLAVIX) 75 MG tablet Take 1 tablet by mouth daily. 06/20/20   [provider]  dextromethorphan-guaiFENesin (MUCINEX DM) 30-600 MG 12hr tablet Take 1 tablet by mouth 2 (two) times daily. 11/08/19   Swayze, Ava, DO  escitalopram (LEXAPRO) 10 MG tablet Take 10 mg by mouth daily.  06/01/18   [provider]  fenofibrate 160 MG tablet Take 160 mg by mouth daily.  12/11/16   [provider]  gabapentin (NEURONTIN) 300 MG capsule Take 3 caps in AM, 3 caps at noon, 4 caps at bedtime 06/05/20   Van Clines, MD  insulin glargine (LANTUS) 100 UNIT/ML injection Inject 40 Units into the skin at bedtime. Patient states that he takes 40 units in the morning and 40 units at night.    [provider]  metFORMIN (GLUCOPHAGE) 1000 MG tablet Take 1,000 mg by mouth 2 (two) times daily with a meal.    [provider]  Omega-3 Fatty Acids (FISH OIL) 1000 MG CAPS Take 1,000 mg by mouth 2 (two) times daily.    [provider]  omeprazole (PRILOSEC)  40 MG capsule Take 1 capsule (40 mg total) by mouth 2 (two) times daily. 03/21/19   Tawni Pummel B, PA-C  tiZANidine (ZANAFLEX) 4 MG tablet Take 4 mg by mouth every 8 (eight) hours as needed for muscle spasms.  05/26/19   [provider]    Allergies    Patient has no known allergies.  Review of Systems   Review of Systems  Constitutional:  Negative for chills and fever.  HENT:  Negative for congestion.   Eyes:  Negative for visual disturbance.  Respiratory:  Negative for shortness of breath.   Cardiovascular:  Negative for chest pain.  Gastrointestinal:  Negative for abdominal pain, diarrhea and vomiting.  Genitourinary:  Negative for dysuria.  Skin:  Negative for rash.  Neurological:  Positive for speech difficulty and weakness. Negative for facial asymmetry, numbness and headaches.   Physical Exam Updated Vital Signs BP (!) 138/94   Pulse 70   Temp 98  F (36.7 C) (Oral)   Resp (!) 22   Ht 6' (1.829 m)   Wt 127 kg   SpO2 96%   BMI 37.97 kg/m   Physical Exam Vitals and nursing note reviewed.  Constitutional:      Appearance: Normal appearance.  HENT:     Head: Normocephalic.     Mouth/Throat:     Mouth: Mucous membranes are moist.  Cardiovascular:     Rate and Rhythm: Normal rate.  Pulmonary:     Effort: Pulmonary effort is normal. No respiratory distress.  Abdominal:     Palpations: Abdomen is soft.     Tenderness: There is no abdominal tenderness.  Skin:    General: Skin is warm.  Neurological:     Mental Status: He is alert and oriented to person, place, and time.     Comments: Slurred speech, no facial asymmetry, full visual fields, no noted visual deficit, weakness in the left upper extremity and bilateral lower extremities.  An NIH 5.  Psychiatric:        Mood and Affect: Mood normal.    ED Results / Procedures / Treatments   Labs (all labs ordered are listed, but only abnormal results are displayed) Labs Reviewed  CBC WITH  DIFFERENTIAL/PLATELET - Abnormal; Notable for the following components:      Result Value   WBC 12.9 (*)    RBC 3.78 (*)    Hemoglobin 12.6 (*)    HCT 38.8 (*)    MCV 102.6 (*)    Neutro Abs 9.9 (*)    All other components within normal limits  BASIC METABOLIC PANEL - Abnormal; Notable for the following components:   Glucose, Bld 126 (*)    All other components within normal limits  ETHANOL  AMMONIA  RAPID URINE DRUG SCREEN, HOSP PERFORMED    EKG EKG Interpretation  Date/Time:  Thursday July 25 2020 11:39:02 EDT Ventricular Rate:  71 PR Interval:  193 QRS Duration: 90 QT Interval:  388 QTC Calculation: 422 R Axis:   57 Text Interpretation: Sinus rhythm Nonspecific T abnormalities, lateral leads Confirmed by Coralee Pesa [phone]) on 07/25/2020 1:26:57 PM  Radiology MR Brain Wo Contrast (neuro protocol)  Result Date: 07/25/2020 CLINICAL DATA:  Neuro deficit, acute stroke suspected. EXAM: MRI HEAD WITHOUT CONTRAST TECHNIQUE: Multiplanar, multiecho pulse sequences of the brain and surrounding structures were obtained without intravenous contrast. COMPARISON:  Jun 18, 2020. FINDINGS: Motion limited evaluation.  Within this limitation: Brain: Evolving infarcts in the posterior limb of the right internal capsule and left basal ganglia with mild edema and decreased conspicuity of diffusion signal abnormality. No evidence of new/interval acute infarct. Remote right frontal infarct with encephalomalacia. Additional scattered T2/FLAIR hyperintensities within the white matter, compatible with advanced chronic microvascular ischemic disease. Similar atrophy with ex vacuo ventricular dilation. No hydrocephalus. No extra-axial fluid collection. No mass lesion or abnormal mass effect. Vascular: Major arterial flow voids are maintained at the skull base. Skull and upper cervical spine: Normal marrow signal. Sinuses/Orbits: Mild scattered paranasal sinus mucosal thickening. Unremarkable orbits. Other: No  sizable mastoid effusions.  MRI IMPRESSION: 1. Evolving infarcts in the posterior limb of the right internal capsule and left basal ganglia with mild edema and decreased conspicuity of diffusion signal abnormality. No evidence of new/interval acute infarct on this motion limited exam. 2. Advanced chronic microvascular ischemic disease. Electronically Signed   By: Feliberto Harts MD   On: 07/25/2020 14:14    Procedures Procedures   Medications Ordered in  ED Medications - No data to display  ED Course  I have reviewed the triage vital signs and the nursing notes.  Pertinent labs & imaging results that were available during my care of the patient were reviewed by me and considered in my medical decision making (see chart for details).    MDM Rules/Calculators/A&P                          55 year old male presents emergency department with concern for slurred speech and left-sided weakness.  Patient is presenting about 20 to 22 hours in from time of onset.  No vans criteria, no stroke page placed.  Vitals are stable.  He has an NIH around 5 for slurred speech and left-sided weakness.  MRI shows evolving infarcts in the posterior limb of the right internal capsule in the left basal ganglia with mild edema.  No evidence of new or acute infarcts.  Will consult neurology and then plan to admit the patient.  Patients evaluation and results requires admission for further treatment and care. Patient agrees with admission plan, offers no new complaints and is stable/unchanged at time of admit.   Final Clinical Impression(s) / ED Diagnoses Final diagnoses:  None    Rx / DC Orders ED Discharge Orders     None        Rozelle LoganHorton, Jamesrobert Ohanesian M, DO 07/25/20 1516

## 2020-07-26 ENCOUNTER — Inpatient Hospital Stay (HOSPITAL_COMMUNITY): Payer: Medicaid Other

## 2020-07-26 ENCOUNTER — Other Ambulatory Visit (HOSPITAL_COMMUNITY): Payer: Self-pay | Admitting: *Deleted

## 2020-07-26 DIAGNOSIS — I6389 Other cerebral infarction: Secondary | ICD-10-CM

## 2020-07-26 LAB — GLUCOSE, CAPILLARY
Glucose-Capillary: 131 mg/dL — ABNORMAL HIGH (ref 70–99)
Glucose-Capillary: 134 mg/dL — ABNORMAL HIGH (ref 70–99)
Glucose-Capillary: 191 mg/dL — ABNORMAL HIGH (ref 70–99)

## 2020-07-26 LAB — LIPID PANEL
Cholesterol: 250 mg/dL — ABNORMAL HIGH (ref 0–200)
HDL: 29 mg/dL — ABNORMAL LOW (ref >40)
LDL Cholesterol: 154 mg/dL — ABNORMAL HIGH (ref 0–99)
Total CHOL/HDL Ratio: 8.6 RATIO
Triglycerides: 334 mg/dL — ABNORMAL HIGH (ref <150)
VLDL: 67 mg/dL — ABNORMAL HIGH (ref 0–40)

## 2020-07-26 LAB — SARS CORONAVIRUS 2 (TAT 6-24 HRS): SARS Coronavirus 2: NEGATIVE

## 2020-07-26 IMAGING — US US CAROTID DUPLEX BILAT
1 series · 13 of 24 positions shown · non-contrast
Comparison: None.

CLINICAL DATA: 54-year-old male with history of stroke.

EXAM:
BILATERAL CAROTID DUPLEX ULTRASOUND
TECHNIQUE: Gray scale imaging, color Doppler and duplex ultrasound were
performed of bilateral carotid and vertebral arteries in the neck.

[Series 1: us carotid bilateral · 13 of 69 slices shown]
[im 1/69]
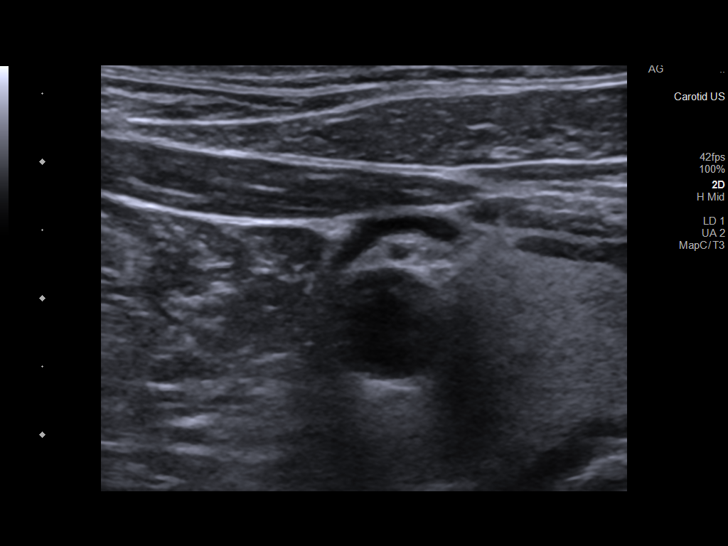
[im 6/69]
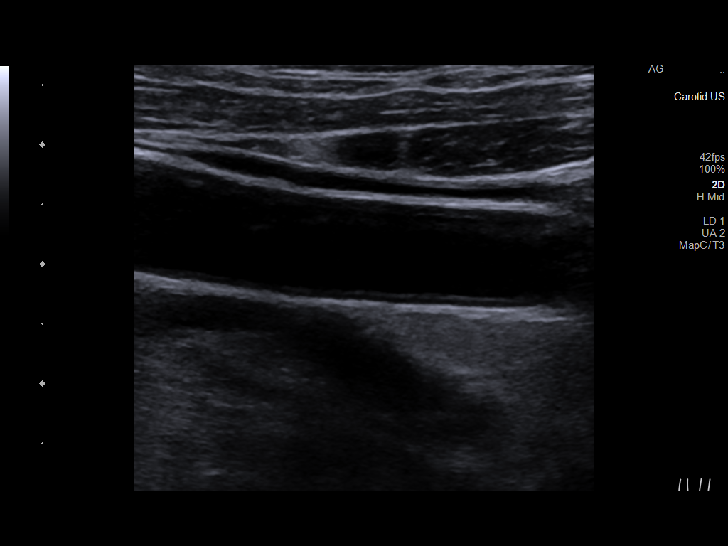
[im 12/69]
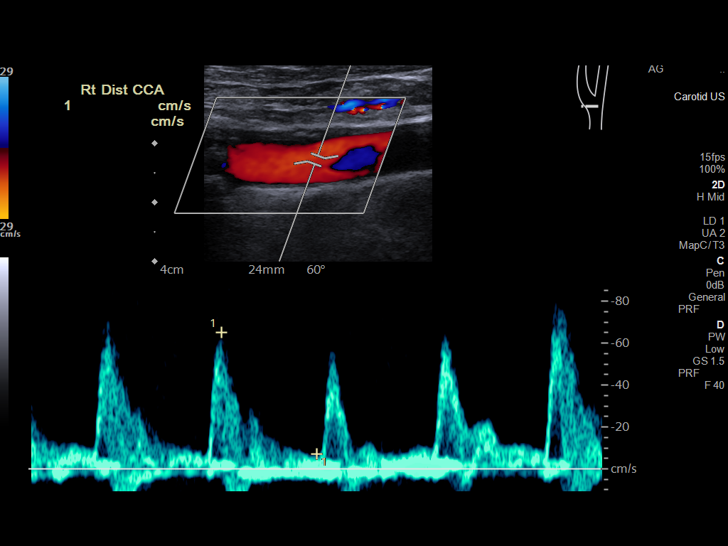
[im 18/69]
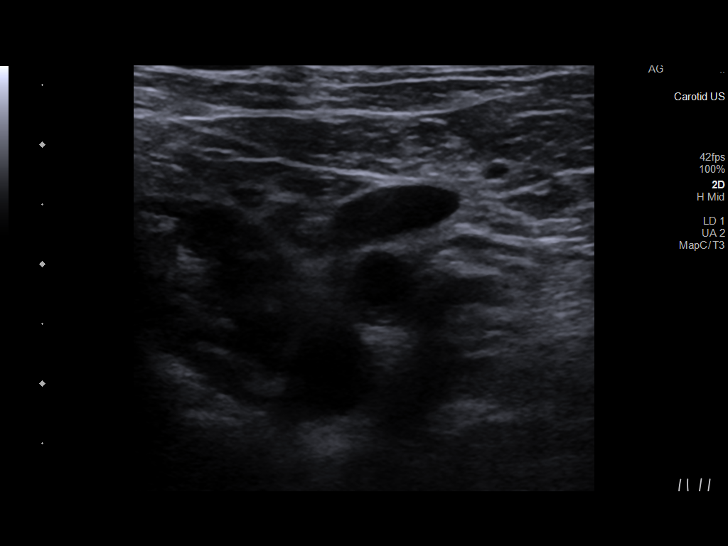
[im 24/69]
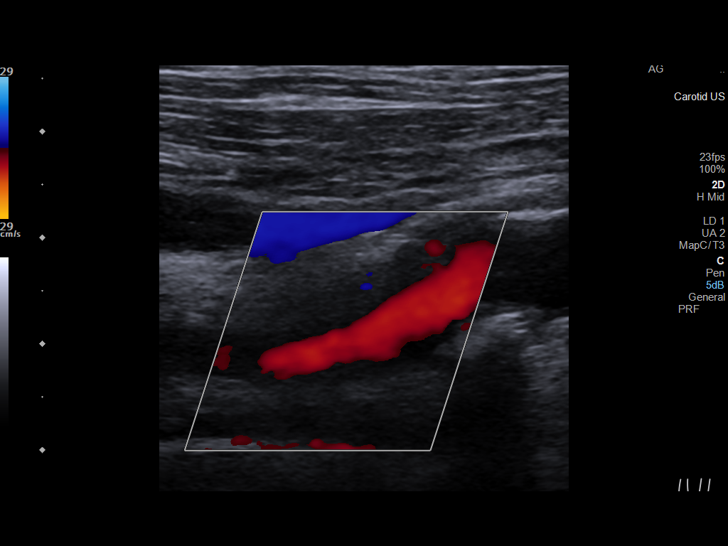
[im 30/69]
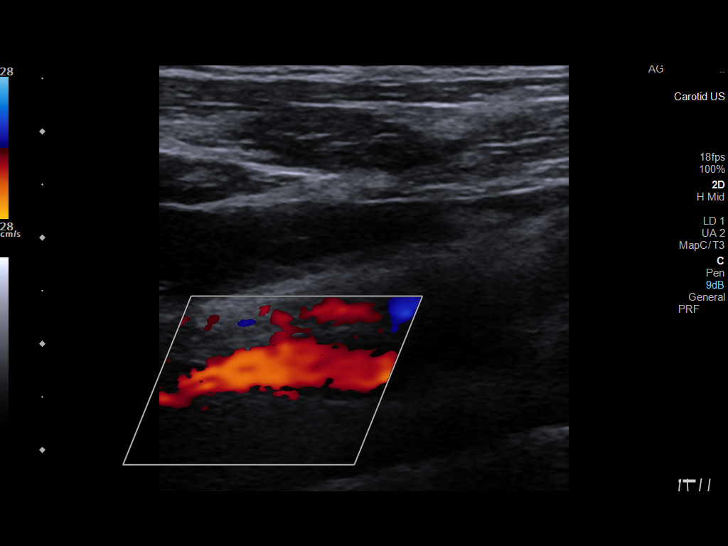
[im 36/69]
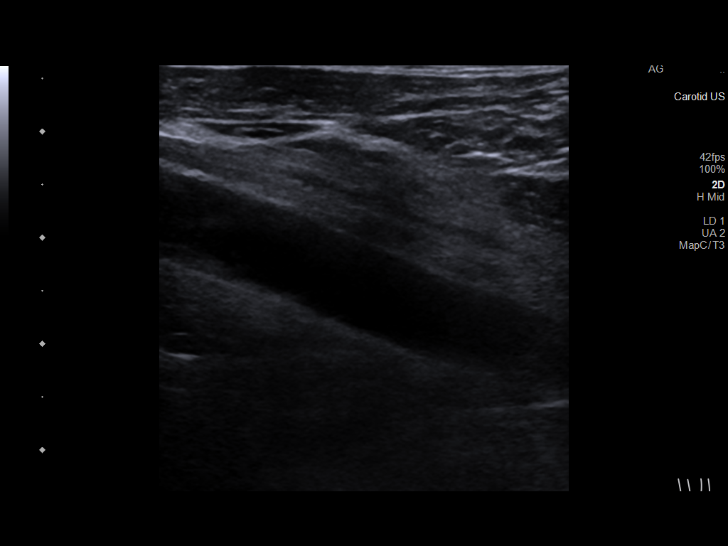
[im 39/69]
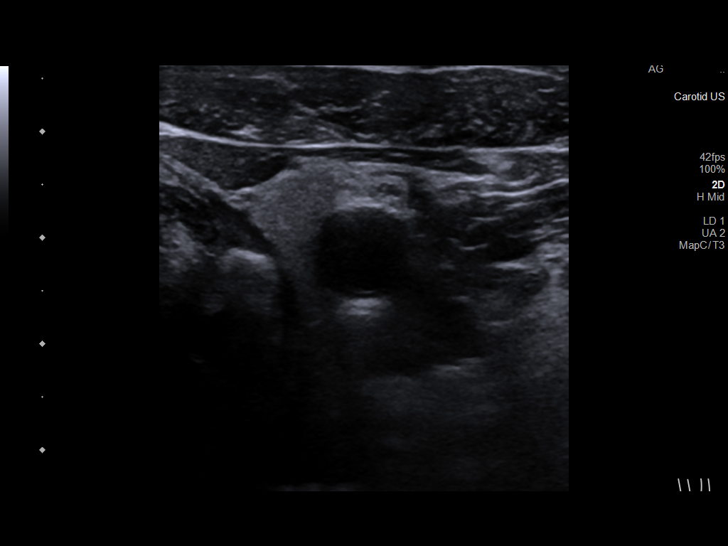
[im 45/69]
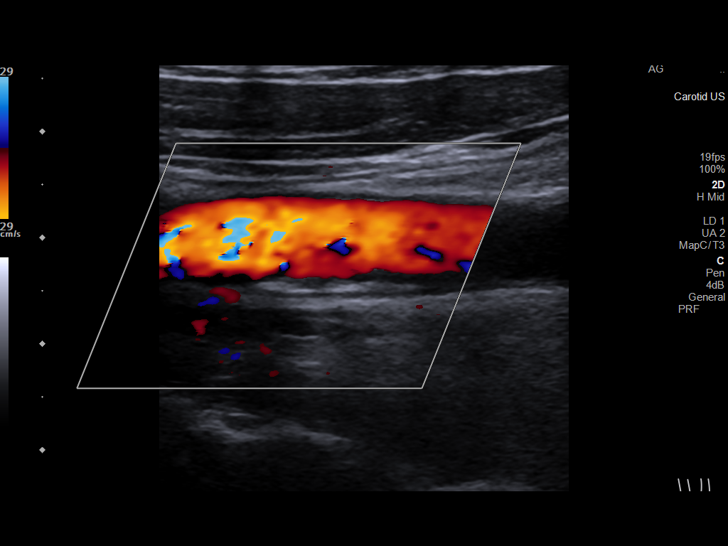
[im 51/69]
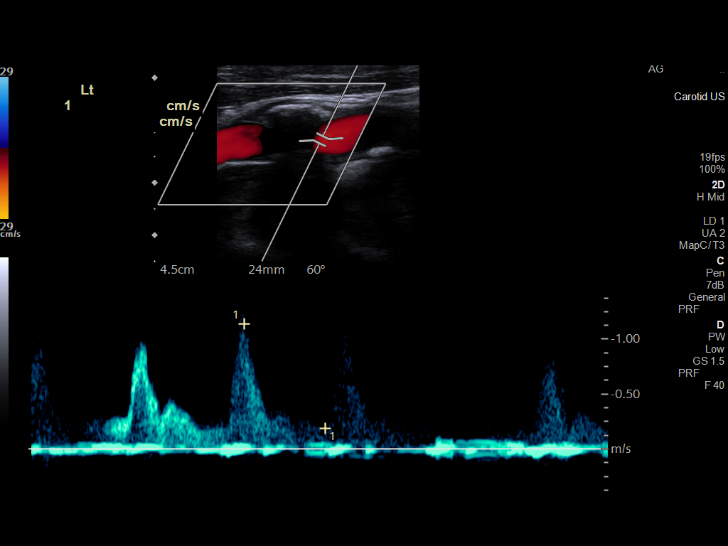
[im 57/69]
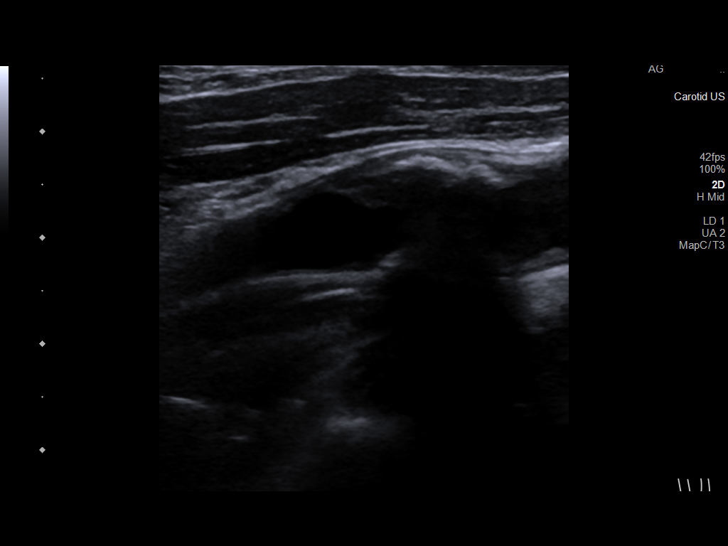
[im 63/69]
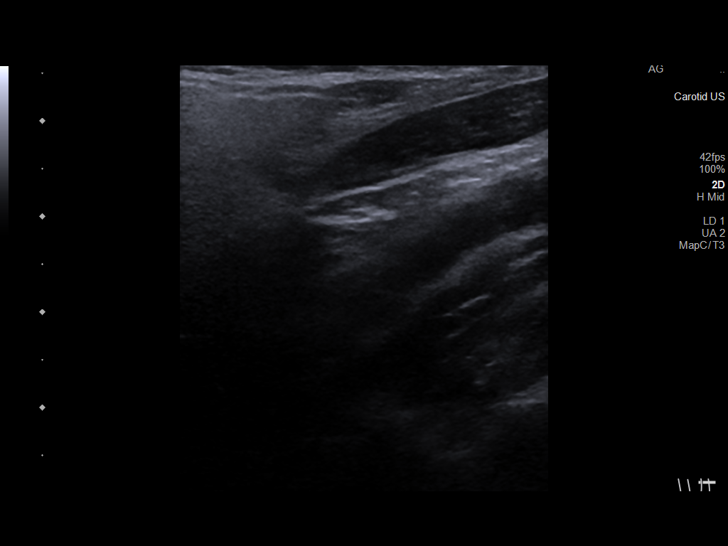
[im 69/69]
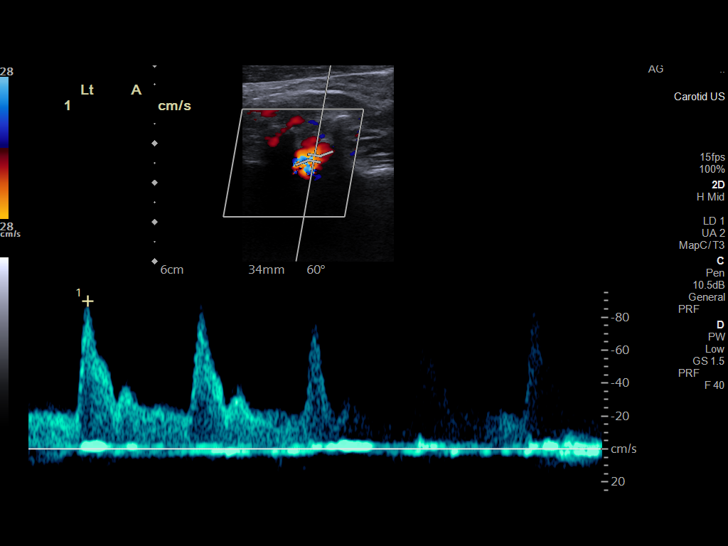

[13 of 24 positions shown; findings below may reference images not displayed]

FINDINGS: Criteria: Quantification of carotid stenosis is based on velocity
parameters that correlate the residual internal carotid diameter
with NASCET-based stenosis levels, using the diameter of the distal
internal carotid lumen as the denominator for stenosis measurement.

The following velocity measurements were obtained:

RIGHT

ICA: Peak systolic velocity 107 cm/sec, End diastolic velocity 19
cm/sec

CCA: Peak systolic velocity 102 cm/sec

SYSTOLIC ICA/CCA RATIO:

ECA: Peak systolic velocity 117 cm/sec

LEFT

ICA: Peak systolic velocity 117 cm/sec, End diastolic velocity 37
cm/sec

CCA: 112 cm/sec

SYSTOLIC ICA/CCA RATIO:

ECA: 116 cm/sec

RIGHT CAROTID ARTERY: Moderate focal, echogenic atherosclerotic
plaque formation at the carotid bulb. No significant tortuosity.
Normal low resistance waveforms.

RIGHT VERTEBRAL ARTERY:  Antegrade flow.

LEFT CAROTID ARTERY: Moderate multifocal, echogenic atherosclerotic
plaque formation at the carotid bulb and bifurcation. No significant
tortuosity. Normal low resistance waveforms.

LEFT VERTEBRAL ARTERY:  Antegrade flow.

Upper extremity non-invasive blood pressures:

Not obtained.
IMPRESSION: 1. Right carotid artery system: Less than 50% stenosis secondary to
moderate atherosclerotic plaque formation.

2. Left carotid artery system: Less than 50% stenosis secondary to
moderate atherosclerotic plaque formation.

3.  Vertebral artery system: Patent with antegrade flow bilaterally.

## 2020-07-26 MED ORDER — ROSUVASTATIN CALCIUM 10 MG PO TABS
10.0000 mg | ORAL_TABLET | Freq: Every day | ORAL | Status: DC
  Administered 2020-07-26 – 2020-07-29 (×4): 10 mg via ORAL
  Filled 2020-07-26 (×4): qty 1

## 2020-07-26 MED ORDER — GABAPENTIN 400 MG PO CAPS
1200.0000 mg | ORAL_CAPSULE | Freq: Every day | ORAL | Status: DC
  Administered 2020-07-26 – 2020-07-28 (×3): 1200 mg via ORAL
  Filled 2020-07-26 (×3): qty 3

## 2020-07-26 MED ORDER — ORAL CARE MOUTH RINSE
15.0000 mL | Freq: Two times a day (BID) | OROMUCOSAL | Status: DC
  Administered 2020-07-28 – 2020-07-29 (×2): 15 mL via OROMUCOSAL

## 2020-07-26 MED ORDER — GABAPENTIN 300 MG PO CAPS: 300.0000 mg | ORAL_CAPSULE | Freq: Three times a day (TID) | ORAL | Status: DC

## 2020-07-26 MED ORDER — GABAPENTIN 300 MG PO CAPS
900.0000 mg | ORAL_CAPSULE | Freq: Two times a day (BID) | ORAL | Status: DC
  Administered 2020-07-27 – 2020-07-29 (×6): 900 mg via ORAL
  Filled 2020-07-26 (×7): qty 3

## 2020-07-26 NOTE — Progress Notes (Signed)
Patient Demographics:    Brent [name], is a 55 y.o. male, DOB - 18-May-1965, FIE:[id number]  Admit date - 07/25/2020   Admitting Physician Starleen Arms, MD  Outpatient Primary MD for the patient is [name], [name], MD  LOS - 1   Chief Complaint  Patient presents with   Cerebrovascular Accident        Subjective:    Brent [name] today has no fevers, no emesis,  No chest pain,   Following deficits and speech concerns persist, patient is very very weak requiring max assist  Assessment  & Plan :    Principal Problem:   Acute CVA (cerebrovascular accident) [facility]) Active Problems:   Dysphagia-----s/p Prior Uvulectomy and Now with Acute CVA   CAD (coronary artery disease)   DM II (diabetes mellitus, type II), controlled (HCC)   Diabetic polyneuropathy associated with diabetes mellitus due to underlying condition (HCC)   COPD with acute exacerbation (HCC)  Brief Summary:- 55 y.o. male, with past medical history of HTN, HLD, DM, ,CAd ,previous CVA admitted on 07/25/2020 with slurred speech and concerns for new acute stroke in the setting of cocaine use  A/p 1) acute CVA----in the setting of ongoing crack cocaine use -Carotid artery Dopplers without hemodynamically significant stenosis -MRA head without LVO MRI Brain with Evolving infarcts in the posterior limb of the right internal capsule and left basal ganglia with mild edema and decreased conspicuity of diffusion signal abnormality -Patient with significant weakness and dysphagia -Echo pending -PT OT recommends CIR rehab -Continue aspirin Plavix and statin along with fenofibrate  2)Dysphagia -----s/p Prior Uvulectomy and Now with Acute CVA -Speech eval and MBS notified -recommends Regular solids and Honey thickened liquids  3)COPD/Tobacco Abuse-- c/n bronchiodilator , not ready to quit  4)GERD--- c/n Protonix   5)HTN--  allow  permissive HTN  6)DM2- Hold Metformin Use Novolog/Humalog Sliding scale insulin with Accu-Cheks/Fingersticks as ordered  7)Polysubstance--- pt admits to crack cocaine use, not ready to quit  Disposition/Need for in-Hospital Stay- patient unable to be discharged at this time due to acute CVA--now awaiting SNF/CIR placement  Status is: Inpatient  Remains inpatient appropriate because: see disposition  Disposition: The patient is from: Home              Anticipated d/c is to: SNF              Anticipated d/c date is: 2 days              Patient currently is medically stable to d/c. Barriers: awaiting CIR Vs SNF Bed  Code Status :  -  Code Status: Full Code   Family Communication:    NA (patient is alert, awake and coherent)   Consults  :  Neuro  DVT Prophylaxis  :   - SCDs  enoxaparin (LOVENOX) injection 40 mg Start: 07/25/20 1800    Lab Results  Component Value Date   PLT 260 07/25/2020    Inpatient Medications  Scheduled Meds:  aspirin  243 mg Oral Once   aspirin EC  81 mg Oral Daily   carbamazepine  400 mg Oral BID   clopidogrel  75 mg Oral Daily   enoxaparin (LOVENOX) injection  40 mg Subcutaneous Q24H   fenofibrate  160 mg Oral Daily   fluticasone furoate-vilanterol  1 puff Inhalation Daily   insulin aspart  0-15 Units Subcutaneous TID WC   insulin aspart  0-5 Units Subcutaneous QHS   mouth rinse  15 mL Mouth Rinse BID   pantoprazole  40 mg Oral Daily   rosuvastatin  10 mg Oral Daily   Continuous Infusions:  sodium chloride 75 mL/hr at 07/26/20 1005   PRN Meds:.acetaminophen **OR** acetaminophen (TYLENOL) oral liquid 160 mg/5 mL **OR** acetaminophen, albuterol, senna-docusate    Anti-infectives (From admission, onward)    None         Objective:   Vitals:   07/26/20 1113 07/26/20 1321 07/26/20 1453 07/26/20 1525  BP: 134/72 (!) 163/93 (!) 157/82 (!) 161/91  Pulse: 85 75 80 83  Resp: Temp: 97.9 F (36.6 C) 97.8 F (36.6 C) 98.1 F  (36.7 C) 97.9 F (36.6 C)  TempSrc: Oral Axillary  Oral  SpO2: 93% 96% 94% 98%  Weight:      Height:        Wt Readings from Last 3 Encounters:  07/25/20 90.1 kg  07/08/20 89.4 kg  11/03/19 96.4 kg     Intake/Output Summary (Last 24 hours) at 07/26/2020 1812 Last data filed at 07/26/2020 1300 Gross per 24 hour  Intake 2045.97 ml  Output --  Net 2045.97 ml     Physical Exam  Gen:- Awake Alert,  in no apparent distress  HEENT:- [place].AT, No sclera icterus Neck-Supple Neck,No JVD,.  Lungs-  CTAB , fair symmetrical air movement CV- S1, S2 normal, regular  Abd-  +ve B.Sounds, Abd Soft, No tenderness,    Extremity/Skin:- No  edema, pedal pulses present  Psych-affect is appropriate, oriented x3 Neuro-generalized weakness, speech and swallowing deficits,  no tremors   Data Review:   Micro Results Recent Results (from the past 240 hour(s))  SARS CORONAVIRUS 2 (TAT 6-24 HRS) Nasopharyngeal Nasopharyngeal Swab     Status: None   Collection Time: 07/25/20  5:56 PM   Specimen: Nasopharyngeal Swab  Result Value Ref Range Status   SARS Coronavirus 2 NEGATIVE NEGATIVE Final    Comment: (NOTE) SARS-CoV-2 target nucleic acids are NOT DETECTED.  The SARS-CoV-2 RNA is generally detectable in upper and lower respiratory specimens during the acute phase of infection. Negative results do not preclude SARS-CoV-2 infection, do not rule out co-infections with other pathogens, and should not be used as the sole basis for treatment or other patient management decisions. Negative results must be combined with clinical observations, patient history, and epidemiological information. The expected result is Negative.  Fact Sheet for Patients: HairSlick.no  Fact Sheet for Healthcare Providers: quierodirigir.com  This test is not yet approved or cleared by the [place] FDA and  has been authorized for detection and/or diagnosis of  SARS-CoV-2 by FDA under an Emergency Use Authorization (EUA). This EUA will remain  in effect (meaning this test can be used) for the duration of the COVID-19 declaration under Se ction 564(b)(1) of the Act, 21 U.S.C. section 360bbb-3(b)(1), unless the authorization is terminated or revoked sooner.  Performed at [facility], [address], [place], [place] [address]     Radiology Reports MR ANGIO HEAD WO CONTRAST  Result Date: 07/25/2020 CLINICAL DATA:  Follow-up strokes in both hemispheres. EXAM: MRA HEAD WITHOUT CONTRAST TECHNIQUE: Angiographic images of the Circle of Willis were acquired using MRA technique without intravenous contrast. COMPARISON:  MRI earlier today. FINDINGS: Anterior circulation: Both  internal carotid arteries widely patent through the skull base and siphon regions. The anterior and middle cerebral vessels show flow without proximal stenosis. More distal vessels poorly seen because of motion degradation. Posterior circulation: Both vertebral arteries widely patent to the basilar. No basilar stenosis. Flow present in both superior cerebellar and posterior cerebral arteries. Distal branch vessels poorly seen because of motion. Anatomic variants: None significant. Other: None. IMPRESSION: Motion degraded study. No large vessel occlusion or correctable proximal stenosis. Electronically Signed   By: Paulina FusiMark  Shogry M.D.   On: 07/25/2020 18:14   MR Brain Wo Contrast (neuro protocol)  Result Date: 07/25/2020 CLINICAL DATA:  Neuro deficit, acute stroke suspected. EXAM: MRI HEAD WITHOUT CONTRAST TECHNIQUE: Multiplanar, multiecho pulse sequences of the brain and surrounding structures were obtained without intravenous contrast. COMPARISON:  Jun 18, 2020. FINDINGS: Motion limited evaluation.  Within this limitation: Brain: Evolving infarcts in the posterior limb of the right internal capsule and left basal ganglia with mild edema and decreased conspicuity of diffusion signal  abnormality. No evidence of new/interval acute infarct. Remote right frontal infarct with encephalomalacia. Additional scattered T2/FLAIR hyperintensities within the white matter, compatible with advanced chronic microvascular ischemic disease. Similar atrophy with ex vacuo ventricular dilation. No hydrocephalus. No extra-axial fluid collection. No mass lesion or abnormal mass effect. Vascular: Major arterial flow voids are maintained at the skull base. Skull and upper cervical spine: Normal marrow signal. Sinuses/Orbits: Mild scattered paranasal sinus mucosal thickening. Unremarkable orbits. Other: No sizable mastoid effusions.  MRI IMPRESSION: 1. Evolving infarcts in the posterior limb of the right internal capsule and left basal ganglia with mild edema and decreased conspicuity of diffusion signal abnormality. No evidence of new/interval acute infarct on this motion limited exam. 2. Advanced chronic microvascular ischemic disease. Electronically Signed   By: Feliberto HartsFrederick S Jones MD   On: 07/25/2020 14:14   US Carotid Bilateral (at [facility] and [facility] only)  Result Date: 07/26/2020 CLINICAL DATA:  55 year old male with history of stroke. EXAM: BILATERAL CAROTID DUPLEX ULTRASOUND TECHNIQUE: Wallace CullensGray scale imaging, color Doppler and duplex ultrasound were performed of bilateral carotid and vertebral arteries in the neck. COMPARISON:  None. FINDINGS: Criteria: Quantification of carotid stenosis is based on velocity parameters that correlate the residual internal carotid diameter with NASCET-based stenosis levels, using the diameter of the distal internal carotid lumen as the denominator for stenosis measurement. The following velocity measurements were obtained: RIGHT ICA: Peak systolic velocity 107 cm/sec, End diastolic velocity 19 cm/sec CCA: Peak systolic velocity 102 cm/sec SYSTOLIC ICA/CCA RATIO:  1.1 ECA: Peak systolic velocity 117 cm/sec LEFT ICA: Peak systolic velocity 117 cm/sec, End diastolic velocity 37 cm/sec CCA:  112 cm/sec SYSTOLIC ICA/CCA RATIO:  1.0 ECA: 116 cm/sec RIGHT CAROTID ARTERY: Moderate focal, echogenic atherosclerotic plaque formation at the carotid bulb. No significant tortuosity. Normal low resistance waveforms. RIGHT VERTEBRAL ARTERY:  Antegrade flow. LEFT CAROTID ARTERY: Moderate multifocal, echogenic atherosclerotic plaque formation at the carotid bulb and bifurcation. No significant tortuosity. Normal low resistance waveforms. LEFT VERTEBRAL ARTERY:  Antegrade flow. Upper extremity non-invasive blood pressures: Not obtained. IMPRESSION: 1. Right carotid artery system: Less than 50% stenosis secondary to moderate atherosclerotic plaque formation. 2. Left carotid artery system: Less than 50% stenosis secondary to moderate atherosclerotic plaque formation. 3.  Vertebral artery system: Patent with antegrade flow bilaterally. Marliss Cootsylan Suttle, MD [facility] Electronically Signed   By: Marliss Cootsylan  Suttle MD   On: 07/26/2020 11:10   DG Swallowing Func-Speech Pathology  Result Date: 07/26/2020 Formatting  of this result is different from the original. Objective Swallowing Evaluation: Type of Study: Bedside Swallow Evaluation  Patient Details Name: BECK COFER MRN: [id number] Date of Birth: 06-18-1965 Today's Date: 07/26/2020 Time: SLP Start Time (ACUTE ONLY): 1531 -SLP Stop Time (ACUTE ONLY): 1558 SLP Time Calculation (min) (ACUTE ONLY): 27 min Past Medical History: Past Medical History: Diagnosis Date  Aplastic anemia, unspecified (HCC)   Chest pain, unspecified   Chronic airway obstruction, not elsewhere classified   Cocaine substance abuse (HCC)   Neuropathy   Other and unspecified hyperlipidemia   Shortness of breath   Stroke (HCC) 06/17/2020  Tobacco use disorder   Type II or unspecified type diabetes mellitus without mention of complication, not stated as uncontrolled   Unspecified epilepsy without mention of intractable epilepsy   Unspecified  essential hypertension  Past Surgical History: Past Surgical History: Procedure Laterality Date  CARDIAC CATHETERIZATION  10/02/2010   Nonobstructive mild coronary plaque  KNEE SURGERY Bilateral 2018  Repair  PERFORATED VISCUS SURGERY    MULTIPLE FRACTURES HPI: Jaiden Dinkins  is a 55 y.o. 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, patient presents to ED due to concerns of slurred speech, and is very poor historian, no family at bedside, was unable to reach significant other by phone, history was mainly obtained by phone and ED staff, reportedly patient states around 5 PM yesterday his significant other noted his speech to be changing, his speech continued to be slurred this morning, so she wanted that to get checked out, patient reports he is feeling generally weak. Patient endorses he smoked crack 07/24/20, reports he does it 1-2 times a week. MRI brain showing acute CVA, ED physician discussed with Dr. Amada Jupiter from teleneurology, who suspect these findings are related to illicit drug use. BSE and SLE requested. Pt with hx of dysphagia see full MBSS report below:  No data recorded Assessment / Plan / Recommendation CHL IP CLINICAL IMPRESSIONS 07/26/2020 Clinical Impression Pt presents with moderate/severe sensorimotor oropharyngeal dypshagia characterized by moderate amounts of SILENT aspiration of thin liquids and Nectar thick liquids. Presentation today is similar to presentation on MBSS completed in 2020, with increased severity today in that NTL were tolerated safely on previous MBSS. Dysphagia today characterized by reduced posterior oral containment (Pt with previous uvulectomy per report) resulting in premature spillage of liquids into the pharynx and into the laryngeal vestibule resulting in penetration and aspiration of thins and NTL down posterior tracheal wall that was not ever sensed. Pt with adequate hyolaryngeal excursion and minimal residuals in  pharynx post swallow. SLP had Pt implement controlled bolus size, chin tuck, modified supraglottic, and oral bolus hold in attempt to eliminate penetration and aspiration, however none of these strategies were consistently effective. There was no penetration or aspiration of HTL and puree and solid textures were consumed without incident. Recommend regular diet with HONEY thick liquids with medications to be administered whole with HTL. Further note, it appears there was no compliance with NTL recommendation from MBSS in 2020 and family reports recurrent PNA. ST will continue to follow acutely. SLP Visit Diagnosis Dysphagia, oropharyngeal phase (R13.12) Attention and concentration deficit following -- Frontal lobe and executive function deficit following -- Impact on safety and function Moderate aspiration risk;Severe aspiration risk   CHL IP TREATMENT RECOMMENDATION 07/26/2020 Treatment Recommendations Therapy as outlined in treatment plan below   Prognosis 07/26/2020 Prognosis for Safe Diet Advancement Guarded Barriers to Reach Goals Severity of deficits  Barriers/Prognosis Comment -- CHL IP DIET RECOMMENDATION 07/26/2020 SLP Diet Recommendations Regular solids;Honey thick liquids Liquid Administration via Cup Medication Administration Whole meds with liquid Compensations Minimize environmental distractions;Slow rate Postural Changes Remain semi-upright after after feeds/meals (Comment)   CHL IP OTHER RECOMMENDATIONS 07/26/2020 Recommended Consults -- Oral Care Recommendations Oral care BID Other Recommendations Order thickener from pharmacy   CHL IP FOLLOW UP RECOMMENDATIONS 07/26/2020 Follow up Recommendations Skilled Nursing facility   [facility] IP FREQUENCY AND DURATION 07/26/2020 Speech Therapy Frequency (ACUTE ONLY) min 2x/week Treatment Duration 1 week      CHL IP ORAL PHASE 07/26/2020 Oral Phase Impaired Oral - Pudding Teaspoon -- Oral - Pudding Cup -- Oral - Honey Teaspoon WFL Oral - Honey Cup -- Oral - Nectar Teaspoon  Premature spillage;Decreased bolus cohesion Oral - Nectar Cup Premature spillage;Decreased bolus cohesion Oral - Nectar Straw NT Oral - Thin Teaspoon Premature spillage;Decreased bolus cohesion Oral - Thin Cup Premature spillage;Decreased bolus cohesion Oral - Thin Straw NT Oral - Puree WFL Oral - Mech Soft NT Oral - Regular WFL Oral - Multi-Consistency NT Oral - Pill WFL Oral Phase - Comment --  CHL IP PHARYNGEAL PHASE 07/26/2020 Pharyngeal Phase Impaired Pharyngeal- Pudding Teaspoon -- Pharyngeal -- Pharyngeal- Pudding Cup -- Pharyngeal -- Pharyngeal- Honey Teaspoon -- Pharyngeal -- Pharyngeal- Honey Cup [facility] Pharyngeal Material does not enter airway Pharyngeal- Nectar Teaspoon -- Pharyngeal -- Pharyngeal- Nectar Cup Delayed swallow initiation-pyriform sinuses;Moderate aspiration;Pharyngeal residue - valleculae;Pharyngeal residue - pyriform;Reduced airway/laryngeal closure;Penetration/Aspiration before swallow Pharyngeal Material enters airway, passes BELOW cords without attempt by patient to eject out (silent aspiration) Pharyngeal- Nectar Straw -- Pharyngeal -- Pharyngeal- Thin Teaspoon Delayed swallow initiation-pyriform sinuses;Moderate aspiration;Pharyngeal residue - valleculae;Pharyngeal residue - pyriform;Reduced airway/laryngeal closure;Penetration/Aspiration before swallow Pharyngeal Material enters airway, passes BELOW cords without attempt by patient to eject out (silent aspiration) Pharyngeal- Thin Cup Delayed swallow initiation-pyriform sinuses;Moderate aspiration;Pharyngeal residue - valleculae;Pharyngeal residue - pyriform;Reduced airway/laryngeal closure;Penetration/Aspiration before swallow Pharyngeal Material enters airway, passes BELOW cords without attempt by patient to eject out (silent aspiration) Pharyngeal- Thin Straw -- Pharyngeal -- Pharyngeal- Puree WFL Pharyngeal -- Pharyngeal- Mechanical Soft -- Pharyngeal -- Pharyngeal- Regular WFL Pharyngeal -- Pharyngeal- Multi-consistency --  Pharyngeal -- Pharyngeal- Pill WFL Pharyngeal -- Pharyngeal Comment --  CHL IP CERVICAL ESOPHAGEAL PHASE 07/26/2020 Cervical Esophageal Phase WFL Pudding Teaspoon -- Pudding Cup -- Honey Teaspoon -- Honey Cup -- Nectar Teaspoon -- Nectar Cup -- Nectar Straw -- Thin Teaspoon -- Thin Cup -- Thin Straw -- Puree -- Mechanical Soft -- Regular -- Multi-consistency -- Pill -- Cervical Esophageal Comment -- Amelia H. Romie Levee, CCC-SLP Speech Language Pathologist Georgetta Haber 07/26/2020, 4:22 PM                CBC Recent Labs  Lab 07/25/20 1307  WBC 12.9*  HGB 12.6*  HCT 38.8*  PLT 260  MCV 102.6*  MCH 33.3  MCHC 32.5  RDW 12.9  LYMPHSABS 1.8  MONOABS 0.9  EOSABS 0.2  BASOSABS 0.1    Chemistries  Recent Labs  Lab 07/25/20 1307  NA 144  K 4.8  CL 108  CO2 31  GLUCOSE 126*  BUN 19  CREATININE 0.97  CALCIUM 9.8   ------------------------------------------------------------------------------------------------------------------ Recent Labs    07/26/20 0559  CHOL 250*  HDL 29*  LDLCALC 154*  TRIG 334*  CHOLHDL 8.6    Lab Results  Component Value Date   HGBA1C 6.7 (H) 11/03/2019   ------------------------------------------------------------------------------------------------------------------ No results for input(s): TSH, T4TOTAL, T3FREE, THYROIDAB in the last 72  hours.  Invalid input(s): FREET3 ------------------------------------------------------------------------------------------------------------------ No results for input(s): VITAMINB12, FOLATE, FERRITIN, TIBC, IRON, RETICCTPCT in the last 72 hours.  Coagulation profile No results for input(s): INR, PROTIME in the last 168 hours.  No results for input(s): DDIMER in the last 72 hours.  Cardiac Enzymes No results for input(s): CKMB, TROPONINI, MYOGLOBIN in the last 168 hours.  Invalid input(s):  CK ------------------------------------------------------------------------------------------------------------------    Component Value Date/Time   BNP 107.0 (H) 11/03/2019 8828     [name] M.D on 07/26/2020 at 6:12 PM  Go to [email or web address] - for contact info  [facility] - Office  [phone]

## 2020-07-26 NOTE — Progress Notes (Signed)
Inpatient Rehab Admissions Coordinator:   Pt. Does not appear to demonstrate medical necessity for CIR. Pt.'s insurance is also out of network with CIR, so if he requires inpatient rehab, TOC will need to locate a facility that accepts his insurance.   Megan Salon, MS, CCC-SLP Rehab Admissions Coordinator  [phone] (celll) [phone] (office)

## 2020-07-26 NOTE — Progress Notes (Signed)
[facility] night shift.  The patient requested to have his gabapentin 300 mg p.o. 3 times daily resumed.  Med reconciliation had different instructions, but will order it as he has been taking it.  Sanda Klein, MD.

## 2020-07-26 NOTE — Evaluation (Signed)
Physical Therapy Evaluation Patient Details Name: Brent [name] MRN: [id number] DOB: 08-May-1965 Today's Date: 07/26/2020   History of Present Illness  Brent [name]  is a 55 y.o. 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, patient presents to ED due to concerns of slurred speech, and is very poor historian, no family at bedside, was unable to reach significant other by phone, history was mainly obtained by phone and ED staff, reportedly patient states around 5 PM yesterday his significant other noted his speech to be changing, his speech continued to be slurred this morning, so she wanted that to get checked out, patient reports he is feeling generally weak, but more in the left side, he denies any vision changes, any loss of consciousness, any confusion, any tingling, or numbness, patient endorses he smoked crack yesterday a friend, report he usually do it 1-2 times a week.   Clinical Impression  Patient limited for functional mobility as stated below secondary to BLE weakness, fatigue and poor standing balance.  Patient seated in chair at beginning of session. Patient tranfers to standing with RW and mod assist secondary to LE weakness and impaired balance. Patient ambulates with very slow, unsteady cadence with poor foot clearance requiring assist for strength and balance deficits. Patient is unsteady with pivot turn to transfer to sitting in transport chair and takes increased time to complete. Patient provided with verbal and tactile cueing throughout session with fair carry over. Patient will benefit from continued physical therapy in hospital and recommended venue below to increase strength, balance, endurance for safe ADLs and gait.     Follow Up Recommendations CIR    Equipment Recommendations  None recommended by PT    Recommendations for Other Services Rehab consult     Precautions / Restrictions Precautions Precautions:  Fall Restrictions Weight Bearing Restrictions: No      Mobility  Bed Mobility               General bed mobility comments: seated in chair at beginning of session    Transfers Overall transfer level: Needs assistance Equipment used: Rolling walker (2 wheeled) Transfers: Sit to/from [employer] Sit to Stand: Mod assist Stand pivot transfers: Mod assist       General transfer comment: Patient requires RW, mod assist, and cueing to transfer to standing; slow, labored movements; frequent cueing for pivot transfer to transport chair with fair carry over, very slow  Ambulation/Gait Ambulation/Gait assistance: Mod assist Gait Distance (Feet): 10 Feet Assistive device: Rolling walker (2 wheeled) Gait Pattern/deviations: Step-through pattern;Trunk flexed Gait velocity: decreased   General Gait Details: slow, labored cadence with RW with poor foot clearance, cueing for proper RW use  Stairs            Wheelchair Mobility    Modified Rankin (Stroke Patients Only)       Balance Overall balance assessment: Needs assistance Sitting-balance support: No upper extremity supported;Feet supported Sitting balance-Leahy Scale: Good Sitting balance - Comments: seated edge of chair   Standing balance support: Bilateral upper extremity supported Standing balance-Leahy Scale: Fair Standing balance comment: fair/poor with RW                             Pertinent Vitals/Pain Pain Assessment: No/denies pain    Home Living Family/patient expects to be discharged to:: Private residence Living Arrangements: Spouse/significant other Available Help at Discharge: Family Type of Home:  House Home Access: Level entry     Home Layout: One level Home Equipment: Toilet riser;Walker - 2 wheels;Bedside commode;Shower seat      Prior Function Level of Independence: Independent with assistive device(s)         Comments: Patient states RW use for  ambulation, family assists PRN     Hand Dominance        Extremity/Trunk Assessment   Upper Extremity Assessment Upper Extremity Assessment: Defer to OT evaluation    Lower Extremity Assessment Lower Extremity Assessment: Generalized weakness    Cervical / Trunk Assessment Cervical / Trunk Assessment: Normal  Communication   Communication: No difficulties  Cognition Arousal/Alertness: Awake/alert Behavior During Therapy: WFL for tasks assessed/performed Overall Cognitive Status: Within Functional Limits for tasks assessed                                        General Comments      Exercises     Assessment/Plan    PT Assessment Patient needs continued PT services  PT Problem List Decreased strength;Decreased mobility;Decreased activity tolerance;Decreased balance;Decreased knowledge of use of DME       PT Treatment Interventions DME instruction;Therapeutic exercise;Gait training;Balance training;Stair training;Neuromuscular re-education;Functional mobility training;Therapeutic activities;Patient/family education    PT Goals (Current goals can be found in the Care Plan section)  Acute Rehab PT Goals Patient Stated Goal: Get stronger PT Goal Formulation: With patient Time For Goal Achievement: 08/09/20 Potential to Achieve Goals: Good    Frequency Min 3X/week   Barriers to discharge        Co-evaluation               AM-PAC PT "6 Clicks" Mobility  Outcome Measure Help needed turning from your back to your side while in a flat bed without using bedrails?: A Little Help needed moving from lying on your back to sitting on the side of a flat bed without using bedrails?: A Little Help needed moving to and from a bed to a chair (including a wheelchair)?: A Lot Help needed standing up from a chair using your arms (e.g., wheelchair or bedside chair)?: A Lot Help needed to walk in hospital room?: A Lot Help needed climbing 3-5 steps with a  railing? : Total 6 Click Score: 13    End of Session Equipment Utilized During Treatment: Gait belt;Oxygen Activity Tolerance: Patient tolerated treatment well;Patient limited by fatigue Patient left: in chair;Other (comment) (with hospital staff) Nurse Communication: Mobility status PT Visit Diagnosis: Unsteadiness on feet (R26.81);Other abnormalities of gait and mobility (R26.89);Muscle weakness (generalized) (M62.81)    Time: 1002-1020 PT Time Calculation (min) (ACUTE ONLY): 18 min   Charges:   PT Evaluation $PT Eval Low Complexity: 1 Low PT Treatments $Therapeutic Activity: 8-22 mins        12:27 PM, 07/26/20 Wyman Songster PT, DPT Physical Therapist at [facility]

## 2020-07-26 NOTE — Evaluation (Signed)
Clinical/Bedside Swallow Evaluation Patient Details  Name: Brent [name] MRN: [id number] Date of Birth: May 04, 1965  Today's Date: 07/26/2020 Time: SLP Start Time (ACUTE ONLY): 1505 SLP Stop Time (ACUTE ONLY): 1523 SLP Time Calculation (min) (ACUTE ONLY): 18.12 min  Past Medical History:  Past Medical History:  Diagnosis Date   Aplastic anemia, unspecified (HCC)    Chest pain, unspecified    Chronic airway obstruction, not elsewhere classified    Cocaine substance abuse (HCC)    Neuropathy    Other and unspecified hyperlipidemia    Shortness of breath    Stroke (HCC) 06/17/2020   Tobacco use disorder    Type II or unspecified type diabetes mellitus without mention of complication, not stated as uncontrolled    Unspecified epilepsy without mention of intractable epilepsy    Unspecified essential hypertension    Past Surgical History:  Past Surgical History:  Procedure Laterality Date   CARDIAC CATHETERIZATION  10/02/2010    Nonobstructive mild coronary plaque   KNEE SURGERY Bilateral 2018   Repair   PERFORATED VISCUS SURGERY     MULTIPLE FRACTURES   HPI:  Brent [name]  is a 55 y.o. 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, patient presents to ED due to concerns of slurred speech, and is very poor historian, no family at bedside, was unable to reach significant other by phone, history was mainly obtained by phone and ED staff, reportedly patient states around 5 PM yesterday his significant other noted his speech to be changing, his speech continued to be slurred this morning, so she wanted that to get checked out, patient reports he is feeling generally weak. Patient endorses he smoked crack 07/24/20, reports he does it 1-2 times a week. MRI brain showing acute CVA, ED physician discussed with Dr. Amada Jupiter from teleneurology, who suspect these findings are related to illicit drug use. BSE and SLE requested. Pt with hx of  dysphagia see full MBSS report below:    MBSS 11/17/2018 <<MBSS completed. Pt assessed with barium tinged tsp/cup thin, NTL, puree, regular textures, and pill in NTL. Pt presents with mild/mod oropharyngeal dysphagia characterized by reduced posterior oral containment (Pt with previous uvulectomy per report) resulting in premature spillage of liquids into the pharynx and occasionally into the laryngeal vestibule resulting in penetration and aspiration of thins down posterior tracheal wall only sensed occasionally. Pt with adequate hyolaryngeal excursion and little to no residuals in pharynx post swallow (min along base of tongue). SLP had Pt implement controlled bolus size, chin tuck, modified supraglottic, and oral bolus hold in attempt to eliminate penetration and aspiration. Pt's best performance with thins was when presented by teaspoon and with small sip thin and chin tuck with cues to clear throat and swallow again. Recommend regular textures and NTL with free water between meals and after oral care [facility] Protocol). Suspect that compliance with thickened liquids will be limited, however Pt agreeable at this time. Also recommend f/u dysphagia intervention as an outpatient if Pt willing to come to therapy to address deficits and implement strategies.>>  Assessment / Plan / Recommendation Clinical Impression  Clinical swallowing evaluation completed with limited trials of thin liquids with Pt sitting upright in bed; Pt was alert and pleasant. Pt with overt s/sx of aspiration with thin liquids with wet vocal quality and delayed coughing. Family reports frequent "choking" episodes and recurrent PNA. Also note hx of recommendation for thickened liquids and dysphagia per MBSS completed --  detailed above. Plan to proceed with MBSS this afternoon in order to objectively assess the swallowing function, hold diet recommendation until instrumental assessment is complete. Thank you, SLP Visit Diagnosis:  Dysphagia, oropharyngeal phase (R13.12)    Aspiration Risk  Moderate aspiration risk;Severe aspiration risk    Diet Recommendation NPO        Other  Recommendations Oral Care Recommendations: Oral care BID     Swallow Study   General Date of Onset: 07/25/20 HPI: Brent [name]  is a 55 y.o. 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, patient presents to ED due to concerns of slurred speech, and is very poor historian, no family at bedside, was unable to reach significant other by phone, history was mainly obtained by phone and ED staff, reportedly patient states around 5 PM yesterday his significant other noted his speech to be changing, his speech continued to be slurred this morning, so she wanted that to get checked out, patient reports he is feeling generally weak. Patient endorses he smoked crack 07/24/20, reports he does it 1-2 times a week. MRI brain showing acute CVA, ED physician discussed with Dr. Amada Jupiter from teleneurology, who suspect these findings are related to illicit drug use. BSE and SLE requested. Pt with hx of dysphagia see full MBSS report below: Type of Study: Bedside Swallow Evaluation Previous Swallow Assessment: MBSS 11/2018 recommending NTL Diet Prior to this Study: NPO Temperature Spikes Noted: No Respiratory Status: Nasal cannula Behavior/Cognition: Alert;Cooperative Oral Cavity Assessment: Within Functional Limits Oral Care Completed by SLP: Yes Oral Cavity - Dentition: Adequate natural dentition Vision: Functional for self-feeding Self-Feeding Abilities: Able to feed self;Needs assist;Needs set up Patient Positioning: Upright in bed Baseline Vocal Quality: Wet    Oral/Motor/Sensory Function Overall Oral Motor/Sensory Function: Within functional limits   Ice Chips Ice chips: Impaired Presentation: Spoon Pharyngeal Phase Impairments: Multiple swallows;Wet Vocal Quality   Thin Liquid Thin Liquid:  Impaired Presentation: Cup Pharyngeal  Phase Impairments: Wet Vocal Quality;Multiple swallows;Cough - Delayed;Throat Clearing - Delayed    Nectar Thick Nectar Thick Liquid: Not tested   Honey Thick Honey Thick Liquid: Not tested   Puree Puree: Not tested   Solid     Solid: Not tested     Debby Clyne H. Romie Levee, CCC-SLP Speech Language Pathologist  Georgetta Haber 07/26/2020,3:23 PM

## 2020-07-26 NOTE — Progress Notes (Signed)
*  PRELIMINARY RESULTS* Echocardiogram 2D Echocardiogram has been performed.  Stacey Drain 07/26/2020, 5:57 PM

## 2020-07-26 NOTE — Progress Notes (Signed)
SLP Cancellation Note  Patient Details Name: Brent [name] MRN: [id number] DOB: [birth year]   Cancelled treatment:       Reason Eval/Treat Not Completed: Patient at procedure or test/unavailable. SLP will re-attempt BSE and SLE this afternoon. Thank you,  Annica Marinello H. Romie Levee, CCC-SLP Speech Language Pathologist    Georgetta Haber 07/26/2020, 10:35 AM

## 2020-07-26 NOTE — Plan of Care (Signed)
  Problem: Acute Rehab PT Goals(only PT should resolve) Goal: Patient Will Transfer Sit To/From Stand Outcome: Progressing Flowsheets (Taken 07/26/2020 1228) Patient will transfer sit to/from stand:  with min guard assist  with minimal assist Goal: Pt Will Transfer Bed To Chair/Chair To Bed Outcome: Progressing Flowsheets (Taken 07/26/2020 1228) Pt will Transfer Bed to Chair/Chair to Bed:  min guard assist  with min assist Goal: Pt Will Ambulate Outcome: Progressing Flowsheets (Taken 07/26/2020 1228) Pt will Ambulate:  50 feet  with minimal assist  with least restrictive assistive device Goal: Pt/caregiver will Perform Home Exercise Program Outcome: Progressing Flowsheets (Taken 07/26/2020 1228) Pt/caregiver will Perform Home Exercise Program:  For increased strengthening  For improved balance  With Supervision, verbal cues required/provided  12:29 PM, 07/26/20 Wyman Songster PT, DPT Physical Therapist at [facility]

## 2020-07-26 NOTE — Progress Notes (Signed)
0630 neuro check not completed due to patient asleep. Pt stated earlier in night to not wake him up if was asleep for anything.

## 2020-07-26 NOTE — Consult Note (Signed)
[facility] Zane Pellecchia A. [name], MD     [email or web address]          Brent [name] is an 55 y.o. male.   ASSESSMENT/PLAN: Right thalamic capsular infarction presenting with dysarthria, left hemiparesis and gait impairment. Risk factors age, hypertension, dyslipidemia and the frequent cocaine use. Dual antiplatelet agents are recommended for 3 weeks. Afterwards a single agent is recommended. Continue with blood pressure control, dyslipidemia control and the discontinue using cocaine. This is discussed at length with the patient. The patient will need extensive occupational and physical therapies. 2.   Severe microvascular ischemic changes of the brain which increases the risk for long-term gait impairment and dementia. This is likely due to risk factors stated above. Additional labs will be obtained however. 3.    Remote lacunar infarcts    This is a 55 year old right-handed white male who presents with the acute onset of a falling associated with global weakness and left-sided weakness along with dysarthria.  He does not report having numbness or tingling. He reports having some headaches. He does report having some mild dyspnea but no chest pain or palpitation. No syncope is noted or loss of consciousness. The patient does have a history of severe hypertriglyceridemia and other did abnormal lipid function. He is on high-dose Lipitor. The patient reports that he has been using cocaine a few times a week over last few years. He reports his symptoms have remained unchanged while has been hospitalized. Imaging showed evidence of acute insignificant stroke. The review systems otherwise negative.   GENERAL:  This is a pleasant male who appears about 15 years older than stated age.  HEENT:  Neck is supple no trauma noted.  ABDOMEN: soft  EXTREMITIES: No edema   BACK: Normal  SKIN: Normal by inspection.    MENTAL STATUS:  He is awake and alert and cooperates with evaluation.  He  is oriented appropriately including to his name and the month. He has a moderate dysarthria [2] and clear evidence of word-finding difficulties [1]. He is able to name for the 5 objects at bedside but has some difficulty finding words however.  CRANIAL NERVES: Pupils are equal, round and reactive to light and accomodation; extra ocular movements are full, there is no significant nystagmus; visual fields are full;  There is mild ptosis on the left. Otherwise, upper and lower facial muscles are normal in strength and symmetric, there is no flattening of the nasolabial folds; tongue is midline; uvula is midline; shoulder elevation is normal.  MOTOR:  right side shows normal tone, bulk and strength without drift. Left deltoid and triceps are 4+/5. Hand grip is 4/5 with mildly impaired finger movements and hand dexterity. There is a mild drift [1]. The left lower extremity is externally rotated and abducted. Strength is 2-3/5 proximally and distally 3/5 [3].   COORDINATION: Left finger to nose is normal, right finger to nose is normal, No rest tremor; no intention tremor; no postural tremor; no bradykinesia.  REFLEXES: Deep tendon reflexes are symmetrical and normal.   SENSATION: Normal to light touch, temperature, and pain.    NIH stroke scale 2,1,1,3  total 7.     Blood pressure (!) 161/91, pulse 83, temperature 97.9 F (36.6 C), temperature source Oral, resp. rate 18, height 6' (1.829 m), weight 90.1 kg, SpO2 98 %.  Past Medical History:  Diagnosis Date   Aplastic anemia, unspecified (HCC)    Chest pain, unspecified    Chronic airway obstruction, not elsewhere classified  Cocaine substance abuse (HCC)    Neuropathy    Other and unspecified hyperlipidemia    Shortness of breath    Stroke (HCC) 06/17/2020   Tobacco use disorder    Type II or unspecified type diabetes mellitus without mention of complication, not stated as uncontrolled    Unspecified epilepsy without mention of  intractable epilepsy    Unspecified essential hypertension     Past Surgical History:  Procedure Laterality Date   CARDIAC CATHETERIZATION  10/02/2010    Nonobstructive mild coronary plaque   KNEE SURGERY Bilateral 2018   Repair   PERFORATED VISCUS SURGERY     MULTIPLE FRACTURES    Family History  Problem Relation Age of Onset   Heart attack Father    COPD Father    Coronary artery disease Mother     Social History:  reports that he has been smoking cigarettes. He has a 30.00 pack-year smoking history. He has never used smokeless tobacco. He reports current alcohol use. He reports that he does not use drugs.  Allergies: No Known Allergies  Medications: Prior to Admission medications   Medication Sig Start Date End Date Taking? Authorizing Provider  albuterol (PROVENTIL) (2.5 MG/3ML) 0.083% nebulizer solution Take 2.5 mg by nebulization every 4 (four) hours as needed for wheezing or shortness of breath.   Yes [provider]  albuterol (VENTOLIN HFA) 108 (90 Base) MCG/ACT inhaler Inhale 2 puffs into the lungs every 6 (six) hours as needed for wheezing or shortness of breath.   Yes [provider]  alclomethasone (ACLOVATE) 0.05 % cream Apply 1 application topically 2 (two) times daily.   Yes [provider]  aspirin EC 81 MG tablet Take 81 mg by mouth daily. Swallow whole.   Yes [provider]  benazepril (LOTENSIN) 10 MG tablet Take 20 mg by mouth daily.   Yes [provider]  budesonide-formoterol (SYMBICORT) 160-4.5 MCG/ACT inhaler Inhale 2 puffs into the lungs in the morning and at bedtime.  06/29/18  Yes [provider]  clopidogrel (PLAVIX) 75 MG tablet Take 75 mg by mouth daily. 06/20/20  Yes [provider]  fenofibrate 160 MG tablet Take 48 mg by mouth daily. 12/11/16  Yes [provider]  gabapentin (NEURONTIN) 300 MG capsule Take 3 caps in AM, 3 caps at noon, 4 caps at bedtime Patient taking  differently: Take by mouth See admin instructions. Take 900 mg in AM, 900mg  at noon, 1200 mg at bedtime 06/05/20  Yes 06/07/20, MD  insulin glargine (LANTUS) 100 UNIT/ML injection Inject 70 Units into the skin daily.   Yes [provider]  JANUMET 50-1000 MG tablet Take 1 tablet by mouth 2 (two) times daily. 07/10/20  Yes [provider]  metoprolol tartrate (LOPRESSOR) 50 MG tablet Take 50 mg by mouth 2 (two) times daily. 05/29/20  Yes [provider]  Omega-3 Fatty Acids (FISH OIL) 1000 MG CAPS Take 500 mg by mouth 2 (two) times daily.   Yes [provider]  omeprazole (PRILOSEC) 40 MG capsule Take 1 capsule (40 mg total) by mouth 2 (two) times daily. 03/21/19  Yes 05/19/19 B, PA-C  tiZANidine (ZANAFLEX) 4 MG tablet Take 2 mg by mouth every 8 (eight) hours as needed for muscle spasms. 05/26/19  Yes [provider]  atorvastatin (LIPITOR) 80 MG tablet Take 80 mg by mouth daily. Patient not taking: Reported on 07/25/2020 10/27/19   [provider]  Budeson-Glycopyrrol-Formoterol (BREZTRI AEROSPHERE) 160-9-4.8 MCG/ACT AERO Inhale  2 puffs into the lungs in the morning and at bedtime. Patient not taking: Reported on 07/25/2020 07/18/19   Waymon BudgeYoung, Clinton D, MD  carbamazepine (TEGRETOL XR) 400 MG 12 hr tablet Take 1 tablet (400 mg total) by mouth 2 (two) times daily. Patient not taking: Reported on 07/25/2020 06/05/20   Van ClinesAquino, [name] M, MD  dextromethorphan-guaiFENesin [facility](MUCINEX DM) 30-600 MG 12hr tablet Take 1 tablet by mouth 2 (two) times daily. Patient not taking: Reported on 07/25/2020 11/08/19   Swayze, Ava, DO  escitalopram (LEXAPRO) 10 MG tablet Take 10 mg by mouth daily.  Patient not taking: Reported on 07/25/2020 06/01/18   [provider]  metFORMIN (GLUCOPHAGE) 1000 MG tablet Take 1,000 mg by mouth 2 (two) times daily with a meal. Patient not taking: No sig reported    [provider]    Scheduled Meds:  aspirin  243 mg Oral  Once   aspirin EC  81 mg Oral Daily   carbamazepine  400 mg Oral BID   clopidogrel  75 mg Oral Daily   enoxaparin (LOVENOX) injection  40 mg Subcutaneous Q24H   fenofibrate  160 mg Oral Daily   fluticasone furoate-vilanterol  1 puff Inhalation Daily   insulin aspart  0-15 Units Subcutaneous TID WC   insulin aspart  0-5 Units Subcutaneous QHS   mouth rinse  15 mL Mouth Rinse BID   pantoprazole  40 mg Oral Daily   rosuvastatin  10 mg Oral Daily   Continuous Infusions:  sodium chloride 75 mL/hr at 07/26/20 1005   PRN Meds:.acetaminophen **OR** acetaminophen (TYLENOL) oral liquid 160 mg/5 mL **OR** acetaminophen, albuterol, senna-docusate     Results for orders placed or performed during the hospital encounter of 07/25/20 (from the past 48 hour(s))  CBC with Differential     Status: Abnormal   Collection Time: 07/25/20  1:07 PM  Result Value Ref Range   WBC 12.9 (H) 4.0 - 10.5 K/uL   RBC 3.78 (L) 4.22 - 5.81 MIL/uL   Hemoglobin 12.6 (L) 13.0 - 17.0 g/dL   HCT 16.138.8 (L) 09.639.0 - 04.552.0 %   MCV 102.6 (H) 80.0 - 100.0 fL   MCH 33.3 26.0 - 34.0 pg   MCHC 32.5 30.0 - 36.0 g/dL   RDW 40.912.9 81.111.5 - 91.415.5 %   Platelets 260 150 - 400 K/uL   nRBC 0.0 0.0 - 0.2 %   Neutrophils Relative % 76 %   Neutro Abs 9.9 (H) 1.7 - 7.7 K/uL   Lymphocytes Relative 14 %   Lymphs Abs 1.8 0.7 - 4.0 K/uL   Monocytes Relative 7 %   Monocytes Absolute 0.9 0.1 - 1.0 K/uL   Eosinophils Relative 2 %   Eosinophils Absolute 0.2 0.0 - 0.5 K/uL   Basophils Relative 1 %   Basophils Absolute 0.1 0.0 - 0.1 K/uL   Immature Granulocytes 0 %   Abs Immature Granulocytes 0.04 0.00 - 0.07 K/uL    Comment: Performed at [facility], [address] [place], [place] [address]  Basic metabolic panel     Status: Abnormal   Collection Time: 07/25/20  1:07 PM  Result Value Ref Range   Sodium 144 135 - 145 mmol/L   Potassium 4.8 3.5 - 5.1 mmol/L   Chloride 108 98 - 111 mmol/L   CO2 31 22 - 32 mmol/L   Glucose, Bld 126 (H) 70 -  99 mg/dL    Comment: Glucose reference range applies only to samples taken after fasting for at least  8 hours.   BUN 19 6 - 20 mg/dL   Creatinine, Ser 1.96 0.61 - 1.24 mg/dL   Calcium 9.8 8.9 - 22.2 mg/dL   GFR, Estimated >97 >98 mL/min    Comment: (NOTE) Calculated using the CKD-EPI Creatinine Equation (2021)    Anion gap 5 5 - 15    Comment: Performed at [facility], [address] [place], [place] [address]  Ethanol     Status: None   Collection Time: 07/25/20  1:07 PM  Result Value Ref Range   Alcohol, Ethyl (B) <10 <10 mg/dL    Comment: (NOTE) Lowest detectable limit for serum alcohol is 10 mg/dL.  For medical purposes only. Performed at [facility], [address], [place], [place] [address]   Urine rapid drug screen (hosp performed)     Status: Abnormal   Collection Time: 07/25/20  1:07 PM  Result Value Ref Range   Opiates NONE DETECTED NONE DETECTED   Cocaine POSITIVE (A) NONE DETECTED   Benzodiazepines NONE DETECTED NONE DETECTED   Amphetamines NONE DETECTED NONE DETECTED   Tetrahydrocannabinol NONE DETECTED NONE DETECTED   Barbiturates NONE DETECTED NONE DETECTED    Comment: (NOTE) DRUG SCREEN FOR MEDICAL PURPOSES ONLY.  IF CONFIRMATION IS NEEDED FOR ANY PURPOSE, NOTIFY LAB WITHIN 5 DAYS.  LOWEST DETECTABLE LIMITS FOR URINE DRUG SCREEN Drug Class                     Cutoff (ng/mL) Amphetamine and metabolites    1000 Barbiturate and metabolites    200 Benzodiazepine                 200 Tricyclics and metabolites     300 Opiates and metabolites        300 Cocaine and metabolites        300 THC                            50 Performed at [facility], [address] [place], [place] [address]   Ammonia     Status: None   Collection Time: 07/25/20  1:07 PM  Result Value Ref Range   Ammonia 23 9 - 35 umol/L    Comment: Performed at [facility], [address], [place], [place] [address]  SARS CORONAVIRUS 2 (TAT 6-24 HRS) Nasopharyngeal Nasopharyngeal Swab      Status: None   Collection Time: 07/25/20  5:56 PM   Specimen: Nasopharyngeal Swab  Result Value Ref Range   SARS Coronavirus 2 NEGATIVE NEGATIVE    Comment: (NOTE) SARS-CoV-2 target nucleic acids are NOT DETECTED.  The SARS-CoV-2 RNA is generally detectable in upper and lower respiratory specimens during the acute phase of infection. Negative results do not preclude SARS-CoV-2 infection, do not rule out co-infections with other pathogens, and should not be used as the sole basis for treatment or other patient management decisions. Negative results must be combined with clinical observations, patient history, and epidemiological information. The expected result is Negative.  Fact Sheet for Patients: HairSlick.no  Fact Sheet for Healthcare Providers: quierodirigir.com  This test is not yet approved or cleared by the [place] FDA and  has been authorized for detection and/or diagnosis of SARS-CoV-2 by FDA under an Emergency Use Authorization (EUA). This EUA will remain  in effect (meaning this test can be used) for the duration of the COVID-19 declaration under Se ction 564(b)(1) of the Act, 21 U.S.C. section 360bbb-3(b)(1),  unless the authorization is terminated or revoked sooner.  Performed at [facility], [address], [place], [place] [address]   Glucose, capillary     Status: Abnormal   Collection Time: 07/25/20 10:22 PM  Result Value Ref Range   Glucose-Capillary 167 (H) 70 - 99 mg/dL    Comment: Glucose reference range applies only to samples taken after fasting for at least 8 hours.  Lipid panel     Status: Abnormal   Collection Time: 07/26/20  5:59 AM  Result Value Ref Range   Cholesterol 250 (H) 0 - 200 mg/dL   Triglycerides 409 (H) <150 mg/dL   HDL 29 (L) >81 mg/dL   Total CHOL/HDL Ratio 8.6 RATIO   VLDL 67 (H) 0 - 40 mg/dL   LDL Cholesterol 191 (H) 0 - 99 mg/dL    Comment:        Total  Cholesterol/HDL:CHD Risk Coronary Heart Disease Risk Table                     Men   Women  1/2 Average Risk   3.4   3.3  Average Risk       5.0   4.4  2 X Average Risk   9.6   7.1  3 X Average Risk  23.4   11.0        Use the calculated Patient Ratio above and the CHD Risk Table to determine the patient's CHD Risk.        ATP III CLASSIFICATION (LDL):  <100     mg/dL   Optimal  478-295  mg/dL   Near or Above                    Optimal  130-159  mg/dL   Borderline  621-308  mg/dL   High  >657     mg/dL   Very High Performed at [facility], [address] [place], [place] [address]   Glucose, capillary     Status: Abnormal   Collection Time: 07/26/20  7:36 AM  Result Value Ref Range   Glucose-Capillary 131 (H) 70 - 99 mg/dL    Comment: Glucose reference range applies only to samples taken after fasting for at least 8 hours.  Glucose, capillary     Status: Abnormal   Collection Time: 07/26/20 11:09 AM  Result Value Ref Range   Glucose-Capillary 134 (H) 70 - 99 mg/dL    Comment: Glucose reference range applies only to samples taken after fasting for at least 8 hours.  Glucose, capillary     Status: Abnormal   Collection Time: 07/26/20  4:04 PM  Result Value Ref Range   Glucose-Capillary 191 (H) 70 - 99 mg/dL    Comment: Glucose reference range applies only to samples taken after fasting for at least 8 hours.    Studies/Results: CAROTID DOPPLERS IMPRESSION: 1. Right carotid artery system: Less than 50% stenosis secondary to moderate atherosclerotic plaque formation.   2. Left carotid artery system: Less than 50% stenosis secondary to moderate atherosclerotic plaque formation.   3.  Vertebral artery system: Patent with antegrade flow bilaterally.    BRAIN MRA FINDINGS: Anterior circulation: Both internal carotid arteries widely patent through the skull base and siphon regions. The anterior and middle cerebral vessels show flow without proximal stenosis. More  distal vessels poorly seen because of motion degradation.   Posterior circulation: Both vertebral arteries widely patent to the basilar. No basilar stenosis. Flow  present in both superior cerebellar and posterior cerebral arteries. Distal branch vessels poorly seen because of motion.   Anatomic variants: None significant.   Other: None.   IMPRESSION: Motion degraded study. No large vessel occlusion or correctable proximal stenosis.    BRAIN MRI IMPRESSION: 1. Evolving infarcts in the posterior limb of the right internal capsule and left basal ganglia with mild edema and decreased conspicuity of diffusion signal abnormality. No evidence of new/interval acute infarct on this motion limited exam. 2. Advanced chronic microvascular ischemic disease.      The MRI of the brain is reviewed in person and shows a small faint infarct involving the lateral aspect of the thalamic nuclei on the right side. There is small encephalomalacia noted in the right centrum semiovale and right caudate new coli consistent with remote lacunar infarcts. There is moderate to severe global atrophy for age. 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 No hemorrhages noted.   Nyeshia Mysliwiec A. [name], M.D.  Diplomate, Biomedical engineer of Psychiatry and Neurology ( Neurology). 07/26/2020, 5:38 PM

## 2020-07-26 NOTE — Evaluation (Signed)
Modified Barium Swallow Progress Note  Patient Details  Name: Brent [name] MRN: [id number] Date of Birth: 1965/04/30  Today's Date: 07/26/2020  Modified Barium Swallow completed.  Full report located under Chart Review in the Imaging Section.  Brief recommendations include the following:  Clinical Impression  Pt presents with moderate/severe sensorimotor oropharyngeal dypshagia characterized by moderate amounts of SILENT aspiration of thin liquids and Nectar thick liquids. Presentation today is similar to presentation on MBSS completed in 2020, with increased severity today in that NTL were tolerated safely on previous MBSS. Dysphagia today characterized by reduced posterior oral containment (Pt with previous uvulectomy per report) resulting in premature spillage of liquids into the pharynx and into the laryngeal vestibule resulting in penetration and aspiration of thins and NTL down posterior tracheal wall that was not ever sensed. Pt with adequate hyolaryngeal excursion and minimal residuals in pharynx post swallow. SLP had Pt implement controlled bolus size, chin tuck, modified supraglottic, and oral bolus hold in attempt to eliminate penetration and aspiration, however none of these strategies were consistently effective. There was no penetration or aspiration of HTL and puree and solid textures were consumed without incident. Recommend regular diet with HONEY thick liquids with medications to be administered whole with HTL. Further note, it appears there was no compliance with NTL recommendation from MBSS in 2020 and family reports recurrent PNA. ST will continue to follow acutely.   Swallow Evaluation Recommendations       SLP Diet Recommendations: Regular solids;Honey thick liquids   Liquid Administration via: Cup   Medication Administration: Whole meds with liquid   Supervision: Patient able to self feed   Compensations: Minimize environmental distractions;Slow rate    Postural Changes: Remain semi-upright after after feeds/meals (Comment)   Oral Care Recommendations: Oral care BID   Other Recommendations: Order thickener from pharmacy   Coree Brame H. Romie Levee, CCC-SLP Speech Language Pathologist  Georgetta Haber 07/26/2020,4:20 PM

## 2020-07-26 NOTE — Progress Notes (Signed)
Assumed care of patient at 1100.

## 2020-07-26 NOTE — Progress Notes (Signed)
SLP Cancellation Note  Patient Details Name: Brent [name] MRN: [id number] DOB: 01-27-1966   Cancelled treatment:        Acknowledge SLE, unfortunately secondary to dysphagia being primary need today and scheduling around other procedures etc unable to complete SLE in addition to BSE and MBSS this date, however ST will continue to follow for SLE when schedule permits. Thank you,   Thomasenia Dowse H. Romie Levee, CCC-SLP Speech Language Pathologist    Georgetta Haber 07/26/2020, 4:22 PM

## 2020-07-26 NOTE — Plan of Care (Signed)
  Problem: Education: Goal: Knowledge of General Education information will improve Description: Including pain rating scale, medication(s)/side effects and non-pharmacologic comfort measures Outcome: Progressing   Problem: Health Behavior/Discharge Planning: Goal: Ability to manage health-related needs will improve Outcome: Progressing   Problem: Education: Goal: Knowledge of disease or condition will improve Outcome: Progressing Goal: Knowledge of secondary prevention will improve Outcome: Progressing Goal: Knowledge of patient specific risk factors addressed and post discharge goals established will improve Outcome: Progressing   Problem: Coping: Goal: Will verbalize positive feelings about self Outcome: Progressing Goal: Will identify appropriate support needs Outcome: Progressing   Problem: Health Behavior/Discharge Planning: Goal: Ability to manage health-related needs will improve Outcome: Progressing   Problem: Self-Care: Goal: Ability to participate in self-care as condition permits will improve Outcome: Progressing Goal: Verbalization of feelings and concerns over difficulty with self-care will improve Outcome: Progressing Goal: Ability to communicate needs accurately will improve Outcome: Progressing   Problem: Nutrition: Goal: Risk of aspiration will decrease Outcome: Progressing Goal: Dietary intake will improve Outcome: Progressing   Problem: Ischemic Stroke/TIA Tissue Perfusion: Goal: Complications of ischemic stroke/TIA will be minimized Outcome: Progressing

## 2020-07-26 NOTE — Evaluation (Addendum)
Occupational Therapy Evaluation Patient Details Name: Brent [name] MRN: [id number] DOB: May 20, 1965 Today's Date: 07/26/2020    History of Present Illness Brent [name]  is a 55 y.o. 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, patient presents to ED due to concerns of slurred speech, and is very poor historian, no family at bedside, was unable to reach significant other by phone, history was mainly obtained by phone and ED staff, reportedly patient states around 5 PM yesterday his significant other noted his speech to be changing, his speech continued to be slurred this morning, so she wanted that to get checked out, patient reports he is feeling generally weak, but more in the left side, he denies any vision changes, any loss of consciousness, any confusion, any tingling, or numbness, patient endorses he smoked crack yesterday a friend, report he usually do it 1-2 times a week.   Clinical Impression   Pt agreeable to OT evaluation. Pt requires Min A for UE dressing to don gown. Max A to don pajama pants standing at EOB using RW. Mod A for supine to sit bed mobility. Pt demonstrates B UE weakness with more pronounced weakness to L UE. Mildly slow, labored fine and gross motor coordination. Max A needed for initial sit to stand from EOB using RW but progressed to more min to mod A for lateral shuffle and stand pivot transfer from EOB to chair. Possible L upper quadrant neglect via empty space when drawing a clock and difficulty with smooth pursuits in L upper and lower quadrant. Pt will benefit from continued OT in the hospital and recommended venue below to increase strength, balance, ROM, and endurance for safe ADL's.     Follow Up Recommendations  CIR    Equipment Recommendations  None recommended by OT           Precautions / Restrictions Precautions Precautions: Fall Restrictions Weight Bearing Restrictions: No      Mobility  Bed Mobility Overal bed mobility: Needs Assistance Bed Mobility: Supine to Sit     Supine to sit: Mod assist;Min assist     General bed mobility comments: supine to sit at EOB    Transfers Overall transfer level: Needs assistance Equipment used: Rolling walker (2 wheeled) Transfers: Sit to/from [employer] Sit to Stand: Max assist;Mod assist Stand pivot transfers: Min assist;Mod assist       General transfer comment: Max A needed for initial sit to stand from EOB. Once up pt required more moderate assist for L side lateral shuffle and stand pivot transfer using RW.    Balance Overall balance assessment: Needs assistance Sitting-balance support: No upper extremity supported;Feet supported Sitting balance-Leahy Scale: Fair Sitting balance - Comments: EOB   Standing balance support: Bilateral upper extremity supported Standing balance-Leahy Scale: Fair Standing balance comment: fair/poor with RW                           ADL either performed or assessed with clinical judgement   ADL Overall ADL's : Needs assistance/impaired Eating/Feeding: Supervision/ safety;Set up;Sitting;Bed level   Grooming: Standing;Wash/dry face;Oral care;Minimal assistance;Min guard   Upper Body Bathing: Min guard;Minimal assistance;Sitting   Lower Body Bathing: Moderate assistance;Maximal assistance;Bed level;Sit to/from stand   Upper Body Dressing : Minimal assistance;Sitting Upper Body Dressing Details (indicate cue type and reason): completed sitting at EOB to don gown Lower Body Dressing: Maximal assistance;Total assistance;Sit to/from stand;Sitting/lateral leans Lower  Body Dressing Details (indicate cue type and reason): Total assist to don socks at EOB. Max A for donning pajama pants sit to stand from EOB using RW. Toilet Transfer: Maximal assistance;Moderate assistance;Stand-pivot Toilet Transfer Details (indicate cue type and reason): simulated via EOB to chair  transfer Toileting- Clothing Manipulation and Hygiene: Moderate assistance;Maximal assistance;Sit to/from stand   Tub/ Shower Transfer: Maximal assistance;Stand-pivot;Tub transfer   Functional mobility during ADLs: Moderate assistance;Rolling walker;Minimal assistance General ADL Comments: Min to mod A to shuffle to L side and complete stand pivot transfer. Max A needed initially for sit to stand.     Vision Baseline Vision/History: No visual deficits Patient Visual Report: No change from baseline Vision Assessment?: Yes Eye Alignment: Within Functional Limits Ocular Range of Motion: Within Functional Limits Alignment/Gaze Preference: Within Defined Limits Tracking/Visual Pursuits: Decreased smoothness of eye movement to LEFT inferior field;Decreased smoothness of eye movement to LEFT superior field;Requires cues, head turns, or add eye shifts to track;Other (comment) (Mixed performance with testing. Poor intitially but improved on 2nd attempt but L some difficulty with smooth tracking on L fields.) Saccades: Additional head turns occurred during testing Convergence: Impaired (comment) (No convergence noted with L eye. R eye only.) Visual Fields: Other (comment) (Noted to leave upper L quadrant of clock picture blank indicating possible L upper quadrant visual field deficit.)                Pertinent Vitals/Pain Pain Assessment: No/denies pain     Hand Dominance Right   Extremity/Trunk Assessment Upper Extremity Assessment Upper Extremity Assessment: RUE deficits/detail;LUE deficits/detail RUE Deficits / Details: ~75% of available A/ROM for shoulder flexion. WFL A/ROm for internal and external rotation.  WFL P/ROM for shoulder flexion and abduction. R elbow flexion and extension 4/5. Grip 4/5. RUE Coordination: decreased fine motor;decreased gross motor (Somewhat slow laborored movements with sequential finger touching and general UE coordination.) LUE Deficits / Details: ~50%  A/ROM for shoulder flexion. Full P/ROM. WFL A/ROm for internal and external rotation. Elbow: 4-/5 flexion, 3+/5 extension. 3+ to 4-/5 grip strength. LUE Coordination: decreased fine motor;decreased gross motor (Somewhat slow laborored movements with sequential finger touching and general UE coordination.)   Lower Extremity Assessment Lower Extremity Assessment: Defer to PT evaluation   Cervical / Trunk Assessment Cervical / Trunk Assessment: Normal   Communication Communication Communication: No difficulties   Cognition Arousal/Alertness: Awake/alert Behavior During Therapy: WFL for tasks assessed/performed Overall Cognitive Status: Within Functional Limits for tasks assessed                                                      Home Living Family/patient expects to be discharged to:: Private residence Living Arrangements: Spouse/significant other Available Help at Discharge: Family;Available 24 hours/day Type of Home: House Home Access: Level entry     Home Layout: One level     Bathroom Shower/Tub: Chief Strategy Officer: Handicapped height Bathroom Accessibility: Yes   Home Equipment: Toilet riser;Walker - 2 wheels;Bedside commode;Shower seat;Grab bars - toilet;Cane - quad;Wheelchair - manual;Other (comment) (3L O2 at home)          Prior Functioning/Environment Level of Independence: Needs assistance  Gait / Transfers Assistance Needed: Patient states RW use for ambulation, family assists PRN ADL's / Homemaking Assistance Needed: Assisted by family Communication / Swallowing Assistance Needed: mild slurred speech Comments:  Patient states RW use for ambulation, family assists PRN        OT Problem List: Decreased strength;Decreased range of motion;Decreased activity tolerance;Impaired balance (sitting and/or standing);Impaired vision/perception;Decreased coordination;Impaired UE functional use      OT Treatment/Interventions:  Self-care/ADL training;Therapeutic exercise;Neuromuscular education;Energy conservation;DME and/or AE instruction;Manual therapy;Therapeutic activities;Visual/perceptual remediation/compensation;Patient/family education;Balance training    OT Goals(Current goals can be found in the care plan section) Acute Rehab OT Goals Patient Stated Goal: Go to rehab. OT Goal Formulation: With patient Time For Goal Achievement: 08/09/20 Potential to Achieve Goals: Good  OT Frequency: Min 2X/week                     End of Session Equipment Utilized During Treatment: Rolling walker;Oxygen (Donning 3 L O2) Nurse Communication: Mobility status  Activity Tolerance: Patient tolerated treatment well Patient left: in chair;with chair alarm set;with call bell/phone within reach  OT Visit Diagnosis: Unsteadiness on feet (R26.81);Other abnormalities of gait and mobility (R26.89);Muscle weakness (generalized) (M62.81);Other symptoms and signs involving the nervous system (M01.027)                Time: 2536-6440 OT Time Calculation (min): 38 min Charges:  OT General Charges $OT Visit: 1 Visit OT Evaluation $OT Eval Low Complexity: 1 Low  Dajahnae Vondra OT, MOT   Danie Chandler 07/26/2020, 12:49 PM

## 2020-07-26 NOTE — TOC Initial Note (Signed)
CSW spoke with Nanine Means at [facility] to inform that pt has been recommended by PT and OT for CIR. TOC to follow.

## 2020-07-26 NOTE — Plan of Care (Signed)
  Problem: Acute Rehab OT Goals (only OT should resolve) Goal: Pt. Will Perform Eating Flowsheets (Taken 07/26/2020 1439) Pt Will Perform Eating:  with modified independence  Independently  sitting Goal: Pt. Will Perform Grooming Flowsheets (Taken 07/26/2020 1439) Pt Will Perform Grooming:  with min guard assist  with supervision  standing  with adaptive equipment Goal: Pt. Will Perform Upper Body Dressing Flowsheets (Taken 07/26/2020 1439) Pt Will Perform Upper Body Dressing:  with modified independence  sitting Goal: Pt. Will Perform Lower Body Dressing Flowsheets (Taken 07/26/2020 1440) Pt Will Perform Lower Body Dressing:  with min assist  sitting/lateral leans  sit to/from stand  with adaptive equipment Goal: Pt. Will Transfer To Toilet Flowsheets (Taken 07/26/2020 1440) Pt Will Transfer to Toilet:  with min guard assist  bedside commode  stand pivot transfer Goal: Pt/Caregiver Will Perform Home Exercise Program Flowsheets (Taken 07/26/2020 1440) Pt/caregiver will Perform Home Exercise Program:  Increased ROM  Increased strength  Both right and left upper extremity  With Supervision  With minimal assist Goal: OT Additional ADL Goal #1 Flowsheets (Taken 07/26/2020 1440) Additional ADL Goal #1: Pt will demonstrate improved visual pursuits and item recognition/replication in L upper and lower quadrants with SPV.  Mahasin Riviere OT, MOT

## 2020-07-27 LAB — GLUCOSE, CAPILLARY
Glucose-Capillary: 120 mg/dL — ABNORMAL HIGH (ref 70–99)
Glucose-Capillary: 122 mg/dL — ABNORMAL HIGH (ref 70–99)
Glucose-Capillary: 185 mg/dL — ABNORMAL HIGH (ref 70–99)
Glucose-Capillary: 185 mg/dL — ABNORMAL HIGH (ref 70–99)
Glucose-Capillary: 225 mg/dL — ABNORMAL HIGH (ref 70–99)

## 2020-07-27 LAB — HEMOGLOBIN A1C
Hgb A1c MFr Bld: 7 % — ABNORMAL HIGH (ref 4.8–5.6)
Mean Plasma Glucose: 154 mg/dL

## 2020-07-27 LAB — ECHOCARDIOGRAM COMPLETE
Area-P 1/2: 2.23 cm2
Height: 72 in
S' Lateral: 2.4 cm
Weight: 3178.15 oz

## 2020-07-27 MED ORDER — TIZANIDINE HCL 4 MG PO TABS
4.0000 mg | ORAL_TABLET | Freq: Four times a day (QID) | ORAL | Status: AC | PRN
  Administered 2020-07-27 – 2020-07-28 (×2): 4 mg via ORAL
  Filled 2020-07-27 (×2): qty 1

## 2020-07-27 MED ORDER — ALBUTEROL SULFATE (2.5 MG/3ML) 0.083% IN NEBU
2.5000 mg | INHALATION_SOLUTION | Freq: Three times a day (TID) | RESPIRATORY_TRACT | Status: DC
  Administered 2020-07-27 – 2020-07-28 (×3): 2.5 mg via RESPIRATORY_TRACT
  Filled 2020-07-27 (×3): qty 3

## 2020-07-27 MED ORDER — ALBUTEROL SULFATE (2.5 MG/3ML) 0.083% IN NEBU: 2.5000 mg | INHALATION_SOLUTION | Freq: Once | RESPIRATORY_TRACT | Status: DC

## 2020-07-27 MED ORDER — ALUM & MAG HYDROXIDE-SIMETH 200-200-20 MG/5ML PO SUSP
30.0000 mL | ORAL | Status: DC | PRN
  Administered 2020-07-28: 30 mL via ORAL
  Filled 2020-07-27: qty 30

## 2020-07-27 MED ORDER — ONDANSETRON HCL 4 MG/2ML IJ SOLN
4.0000 mg | Freq: Four times a day (QID) | INTRAMUSCULAR | Status: DC | PRN
  Administered 2020-07-27: 4 mg via INTRAVENOUS
  Filled 2020-07-27: qty 2

## 2020-07-27 NOTE — TOC Progression Note (Signed)
Transition of Care [facility]) - Progression Note    Patient Details  Name: MILLARD BAUTCH MRN: [id number] Date of Birth: 04/12/1965  [facility] CM/SW Contact  Barry Brunner, LCSW Phone Number: 07/27/2020, 12:34 PM  Clinical Narrative:    CSW spoke with patient's spouse to identify which SNF's they are agreeable to for referrals. Patient's spouse reported that they are agreeable to [facility] and [facility]. CSW placed referrals. TOC to follow.      Barriers to Discharge: Continued Medical Work up  Expected Discharge Plan and Services                                                 Social Determinants of Health (SDOH) Interventions    Readmission Risk Interventions No flowsheet data found.

## 2020-07-27 NOTE — NC FL2 (Signed)
[place] MEDICAID FL2 LEVEL OF CARE SCREENING TOOL     IDENTIFICATION  Patient Name: Brent [name] Birthdate: August 30, 1965 Sex: male Admission Date (Current Location): 07/25/2020  [place] and [place] Number:  [name] [id number] Facility and Address:  [facility],  [address] [place] [address]      Provider Number: [id number]  Attending Physician Name and Address:  [name], MD  Relative Name and Phone Number:  Felicie Morn (Significant other)   [phone]    Current Level of Care: SNF Recommended Level of Care: Skilled Nursing Facility Prior Approval Number:    Date Approved/Denied: 02/08/20 PASRR Number: [id number]  Discharge Plan: SNF    Current Diagnoses: Patient Active Problem List   Diagnosis Date Noted   Acute CVA (cerebrovascular accident) (HCC) 07/25/2020   Acute on chronic respiratory failure with hypoxia (HCC) 11/04/2019   Rhinovirus infection 11/04/2019   Essential hypertension 11/04/2019   CAP (community acquired pneumonia) 11/03/2019   Diabetic polyneuropathy associated with diabetes mellitus due to underlying condition (HCC) 02/06/2019   Localization-related (focal) (partial) symptomatic epilepsy and epileptic syndromes with complex partial seizures, intractable, without status epilepticus (HCC) 02/06/2019   Hyperlipidemia associated with type 2 diabetes mellitus (HCC) 11/26/2018   Cough 11/03/2018   Obstructive sleep apnea 11/03/2018   Dysphagia-----s/p Prior Uvulectomy and Now with Acute CVA 09/27/2018   Eczema 09/27/2018   Right leg weakness 01/19/2018   History of arthroscopy of knee 11/15/2017   Complex tear of lateral meniscus of right knee as current injury 10/27/2017   Effusion of right knee 07/26/2017   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   Cellulitis 07/22/2017   Primary osteoarthritis involving multiple joints 06/22/2017   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  02/01/2017   Right knee pain 02/01/2017   Acute bronchitis 12/07/2016   Acute idiopathic gout of left foot 12/07/2016   Anxiety, generalized 12/07/2016   COPD with acute exacerbation (HCC) 12/07/2016   Seizures (HCC) 12/07/2016   CAD (coronary artery disease) 12/19/2010   DM II (diabetes mellitus, type II), controlled (HCC) 12/19/2010   Hypertension associated with diabetes (HCC) 12/19/2010   GERD (gastroesophageal reflux disease) 12/19/2010   Hypertriglyceridemia 12/19/2010   Tobacco abuse 12/19/2010    Orientation RESPIRATION BLADDER Height & Weight     Self, Time, Situation, Place  Normal Continent Weight: 198 lb 10.2 oz (90.1 kg) Height:  6' (182.9 cm)  BEHAVIORAL SYMPTOMS/MOOD NEUROLOGICAL BOWEL NUTRITION STATUS      Continent Diet  AMBULATORY STATUS COMMUNICATION OF NEEDS Skin   Extensive Assist Verbally Other (Comment) (Crack Ecchymosis)                       Personal Care Assistance Level of Assistance  Bathing, Feeding, Dressing Bathing Assistance: Maximum assistance Feeding assistance: Limited assistance Dressing Assistance: Maximum assistance     Functional Limitations Info  Sight, Hearing, Speech Sight Info: Impaired Hearing Info: Adequate Speech Info: Adequate    SPECIAL CARE FACTORS FREQUENCY  PT (By licensed PT), OT (By licensed OT)     PT Frequency: 5x OT Frequency: 5x            Contractures Contractures Info: Not present    Additional Factors Info  Code Status, Allergies, Insulin Sliding Scale Code Status Info: Full Allergies Info: N/A   Insulin Sliding Scale Info: CBG < 70: Implement Hypoglycemia Standing Orders and refer to Hypoglycemia Standing Orders sidebar report   CBG 70 - 120: 0 units  CBG 121 - 150: 0 units   CBG 151 - 200: 0 units   CBG 201 - 250: 2 units   CBG 251 - 300: 3 units   CBG 301 - 350: 4 units   CBG 351 - 400: 5 units   CBG > 400 call MD and obtain STAT lab verification       Current Medications (07/27/2020):   This is the current hospital active medication list Current Facility-Administered Medications  Medication Dose Route Frequency Provider Last Rate Last Admin   0.9 %  sodium chloride infusion   Intravenous Continuous Mariea Clonts, Courage, MD 50 mL/hr at 07/27/20 0407 New Bag at 07/27/20 0407   acetaminophen (TYLENOL) tablet 650 mg  650 mg Oral Q4H PRN Elgergawy, Leana Roe, MD       Or   acetaminophen (TYLENOL) 160 MG/5ML solution 650 mg  650 mg Per Tube Q4H PRN Elgergawy, Leana Roe, MD       Or   acetaminophen (TYLENOL) suppository 650 mg  650 mg Rectal Q4H PRN Elgergawy, Leana Roe, MD       albuterol (PROVENTIL) (2.5 MG/3ML) 0.083% nebulizer solution 2.5 mg  2.5 mg Nebulization Q4H PRN Elgergawy, Leana Roe, MD   2.5 mg at 07/27/20 1856   albuterol (PROVENTIL) (2.5 MG/3ML) 0.083% nebulizer solution 2.5 mg  2.5 mg Nebulization Once Emokpae, Courage, MD       albuterol (PROVENTIL) (2.5 MG/3ML) 0.083% nebulizer solution 2.5 mg  2.5 mg Nebulization TID Emokpae, Courage, MD       aspirin chewable tablet 243 mg  243 mg Oral Once Elgergawy, Leana Roe, MD       aspirin EC tablet 81 mg  81 mg Oral Daily Elgergawy, Leana Roe, MD   81 mg at 07/27/20 0831   carbamazepine (TEGRETOL XR) 12 hr tablet 400 mg  400 mg Oral BID Elgergawy, Leana Roe, MD   400 mg at 07/26/20 2132   clopidogrel (PLAVIX) tablet 75 mg  75 mg Oral Daily Elgergawy, Leana Roe, MD   75 mg at 07/27/20 0832   enoxaparin (LOVENOX) injection 40 mg  40 mg Subcutaneous Q24H Elgergawy, Leana Roe, MD   40 mg at 07/26/20 1816   fenofibrate tablet 160 mg  160 mg Oral Daily Elgergawy, Leana Roe, MD   160 mg at 07/27/20 0831   fluticasone furoate-vilanterol (BREO ELLIPTA) 200-25 MCG/INH 1 puff  1 puff Inhalation Daily Elgergawy, Leana Roe, MD   1 puff at 07/27/20 0820   gabapentin (NEURONTIN) capsule 1,200 mg  1,200 mg Oral QHS Emokpae, Courage, MD   1,200 mg at 07/26/20 2210   gabapentin (NEURONTIN) capsule 900 mg  900 mg Oral BID Emokpae, Courage, MD   900 mg at  07/27/20 0831   insulin aspart (novoLOG) injection 0-15 Units  0-15 Units Subcutaneous TID WC Elgergawy, Leana Roe, MD   3 Units at 07/26/20 1817   insulin aspart (novoLOG) injection 0-5 Units  0-5 Units Subcutaneous QHS Elgergawy, Leana Roe, MD       MEDLINE mouth rinse  15 mL Mouth Rinse BID Emokpae, Courage, MD       ondansetron (ZOFRAN) injection 4 mg  4 mg Intravenous Q6H PRN Emokpae, Courage, MD   4 mg at 07/27/20 0830   pantoprazole (PROTONIX) EC tablet 40 mg  40 mg Oral Daily Elgergawy, Leana Roe, MD   40 mg at 07/27/20 3149   rosuvastatin (CRESTOR) tablet 10 mg  10 mg Oral Daily Emokpae, Courage, MD   10 mg at  07/27/20 0831   senna-docusate (Senokot-S) tablet 1 tablet  1 tablet Oral QHS PRN Elgergawy, Leana Roe, MD         Discharge Medications: Please see discharge summary for a list of discharge medications.  Relevant Imaging Results:  Relevant Lab Results:   Additional Information Pt SSN: [id number]  Barry Brunner, LCSW

## 2020-07-27 NOTE — Progress Notes (Signed)
Patient Demographics:    Brent [name], is a 55 y.o. male, DOB - 23-Oct-1965, WUJ:[id number]  Admit date - 07/25/2020   Admitting Physician Starleen Arms, MD  Outpatient Primary MD for the patient is [name], [name], MD  LOS - 2   Chief Complaint  Patient presents with   Cerebrovascular Accident        Subjective:    Brent [name] today has no fevers, no emesis,  No chest pain,   -Speech and swallowing deficits persist -Generalized weakness improving -No cough or dyspnea -now awaiting SNF/CIR placement --Given improvement in patient's neuromuscular deficits over the last 24 hours he would benefit from PT and OT reevaluation, if he continues to improve may be able to go home with home health rather than going to SNF  Assessment  & Plan :    Principal Problem:   Acute CVA (cerebrovascular accident) [facility]) Active Problems:   Dysphagia-----s/p Prior Uvulectomy and Now with Acute CVA   CAD (coronary artery disease)   DM II (diabetes mellitus, type II), controlled (HCC)   Diabetic polyneuropathy associated with diabetes mellitus due to underlying condition (HCC)   COPD with acute exacerbation (HCC)  Brief Summary:- 55 y.o. male, with past medical history of HTN, HLD, DM, ,CAd ,previous CVA admitted on 07/25/2020 with slurred speech and concerns for new acute stroke in the setting of cocaine use  A/p 1) acute CVA----in the setting of ongoing crack cocaine use -Carotid artery Dopplers without hemodynamically significant stenosis -MRA head without LVO MRI Brain with Evolving infarcts in the posterior limb of the right internal capsule and left basal ganglia with mild edema and decreased conspicuity of diffusion signal abnormality -Speech and swallowing deficits persist -Generalized weakness improving -Echo with EF of 65 to 70%, no regional wall motion abnormalities -PT OT recommends CIR  rehab -Continue aspirin Plavix and statin along with fenofibrate -Given improvement in patient's neuromuscular deficits over the last 24 hours he would benefit from PT and OT reevaluation, if he continues to improve may be able to go home with home health rather than going to SNF  2)Dysphagia -----s/p Prior Uvulectomy and Now with Acute CVA -Speech eval and MBS notified -recommends Regular solids and Honey thickened liquids--patient is notoriously noncompliant with dietary modifications  3)COPD/Tobacco Abuse-- c/n bronchiodilator , not ready to quit  4)GERD--- c/n Protonix   5)HTN--  allow permissive HTN  6)DM2- Hold Metformin Use Novolog/Humalog Sliding scale insulin with Accu-Cheks/Fingersticks as ordered  7)Polysubstance--- pt admits to crack cocaine use, not ready to quit -Patient lives with a wife who is an alcoholic  Disposition/Need for in-Hospital Stay- patient unable to be discharged at this time due to acute CVA--now awaiting SNF/CIR placement --Given improvement in patient's neuromuscular deficits over the last 24 hours he would benefit from PT and OT reevaluation, if he continues to improve may be able to go home with home health rather than going to SNF  Status is: Inpatient  Remains inpatient appropriate because: see disposition  Disposition: The patient is from: Home              Anticipated d/c is to: SNF Vs [facility]              Anticipated d/c date is:  2 days              Patient currently is medically stable to d/c. Barriers: awaiting CIR Vs SNF Bed Vs Home with HH  Code Status :  -  Code Status: Full Code   Family Communication:    NA (patient is alert, awake and coherent)   Consults  :  Neuro  DVT Prophylaxis  :   - SCDs  enoxaparin (LOVENOX) injection 40 mg Start: 07/25/20 1800    Lab Results  Component Value Date   PLT 260 07/25/2020    Inpatient Medications  Scheduled Meds:  albuterol  2.5 mg Nebulization Once   albuterol  2.5 mg  Nebulization TID   aspirin  243 mg Oral Once   aspirin EC  81 mg Oral Daily   carbamazepine  400 mg Oral BID   clopidogrel  75 mg Oral Daily   enoxaparin (LOVENOX) injection  40 mg Subcutaneous Q24H   fenofibrate  160 mg Oral Daily   fluticasone furoate-vilanterol  1 puff Inhalation Daily   gabapentin  1,200 mg Oral QHS   gabapentin  900 mg Oral BID   insulin aspart  0-15 Units Subcutaneous TID WC   insulin aspart  0-5 Units Subcutaneous QHS   mouth rinse  15 mL Mouth Rinse BID   pantoprazole  40 mg Oral Daily   rosuvastatin  10 mg Oral Daily   Continuous Infusions:  sodium chloride 50 mL/hr at 07/27/20 0407   PRN Meds:.acetaminophen **OR** acetaminophen (TYLENOL) oral liquid 160 mg/5 mL **OR** acetaminophen, albuterol, ondansetron (ZOFRAN) IV, senna-docusate    Anti-infectives (From admission, onward)    None         Objective:   Vitals:   07/26/20 2023 07/27/20 0413 07/27/20 0824 07/27/20 1511  BP: (!) 155/87 (!) 152/82  138/81  Pulse: 80 66  84  Resp: 18 20  18   Temp: 98.4 F (36.9 C) 98.2 F (36.8 C)    TempSrc: Oral Oral    SpO2: 98% 96% 95% 93%  Weight:      Height:        Wt Readings from Last 3 Encounters:  07/25/20 90.1 kg  07/08/20 89.4 kg  11/03/19 96.4 kg     Intake/Output Summary (Last 24 hours) at 07/27/2020 1820 Last data filed at 07/27/2020 0900 Gross per 24 hour  Intake 1262.55 ml  Output 1075 ml  Net 187.55 ml     Physical Exam  Gen:- Awake Alert,  in no apparent distress  HEENT:- [place].AT, No sclera icterus Neck-Supple Neck,No JVD,.  Lungs-  CTAB , fair symmetrical air movement CV- S1, S2 normal, regular  Abd-  +ve B.Sounds, Abd Soft, No tenderness,    Extremity/Skin:- No  edema, pedal pulses present  Psych-affect is appropriate, oriented x3 Neuro-improving weakness,  -speech and swallowing deficits persist,  no tremors   Data Review:   Micro Results Recent Results (from the past 240 hour(s))  SARS CORONAVIRUS 2 (TAT 6-24  HRS) Nasopharyngeal Nasopharyngeal Swab     Status: None   Collection Time: 07/25/20  5:56 PM   Specimen: Nasopharyngeal Swab  Result Value Ref Range Status   SARS Coronavirus 2 NEGATIVE NEGATIVE Final    Comment: (NOTE) SARS-CoV-2 target nucleic acids are NOT DETECTED.  The SARS-CoV-2 RNA is generally detectable in upper and lower respiratory specimens during the acute phase of infection. Negative results do not preclude SARS-CoV-2 infection, do not rule out co-infections with other pathogens, and should not be used  as the sole basis for treatment or other patient management decisions. Negative results must be combined with clinical observations, patient history, and epidemiological information. The expected result is Negative.  Fact Sheet for Patients: HairSlick.no  Fact Sheet for Healthcare Providers: quierodirigir.com  This test is not yet approved or cleared by the [place] FDA and  has been authorized for detection and/or diagnosis of SARS-CoV-2 by FDA under an Emergency Use Authorization (EUA). This EUA will remain  in effect (meaning this test can be used) for the duration of the COVID-19 declaration under Se ction 564(b)(1) of the Act, 21 U.S.C. section 360bbb-3(b)(1), unless the authorization is terminated or revoked sooner.  Performed at [facility], [address] [place], [place] [address]     Radiology Reports MR ANGIO HEAD WO CONTRAST  Result Date: 07/25/2020 CLINICAL DATA:  Follow-up strokes in both hemispheres. EXAM: MRA HEAD WITHOUT CONTRAST TECHNIQUE: Angiographic images of the Circle of Willis were acquired using MRA technique without intravenous contrast. COMPARISON:  MRI earlier today. FINDINGS: Anterior circulation: Both internal carotid arteries widely patent through the skull base and siphon regions. The anterior and middle cerebral vessels show flow without proximal stenosis. More distal  vessels poorly seen because of motion degradation. Posterior circulation: Both vertebral arteries widely patent to the basilar. No basilar stenosis. Flow present in both superior cerebellar and posterior cerebral arteries. Distal branch vessels poorly seen because of motion. Anatomic variants: None significant. Other: None. IMPRESSION: Motion degraded study. No large vessel occlusion or correctable proximal stenosis. Electronically Signed   By: Paulina Fusi M.D.   On: 07/25/2020 18:14   MR Brain Wo Contrast (neuro protocol)  Result Date: 07/25/2020 CLINICAL DATA:  Neuro deficit, acute stroke suspected. EXAM: MRI HEAD WITHOUT CONTRAST TECHNIQUE: Multiplanar, multiecho pulse sequences of the brain and surrounding structures were obtained without intravenous contrast. COMPARISON:  Jun 18, 2020. FINDINGS: Motion limited evaluation.  Within this limitation: Brain: Evolving infarcts in the posterior limb of the right internal capsule and left basal ganglia with mild edema and decreased conspicuity of diffusion signal abnormality. No evidence of new/interval acute infarct. Remote right frontal infarct with encephalomalacia. Additional scattered T2/FLAIR hyperintensities within the white matter, compatible with advanced chronic microvascular ischemic disease. Similar atrophy with ex vacuo ventricular dilation. No hydrocephalus. No extra-axial fluid collection. No mass lesion or abnormal mass effect. Vascular: Major arterial flow voids are maintained at the skull base. Skull and upper cervical spine: Normal marrow signal. Sinuses/Orbits: Mild scattered paranasal sinus mucosal thickening. Unremarkable orbits. Other: No sizable mastoid effusions.  MRI IMPRESSION: 1. Evolving infarcts in the posterior limb of the right internal capsule and left basal ganglia with mild edema and decreased conspicuity of diffusion signal abnormality. No evidence of new/interval acute infarct on this motion limited exam. 2. Advanced chronic  microvascular ischemic disease. Electronically Signed   By: Feliberto Harts MD   On: 07/25/2020 14:14   US Carotid Bilateral (at [facility] and [facility] only)  Result Date: 07/26/2020 CLINICAL DATA:  55 year old male with history of stroke. EXAM: BILATERAL CAROTID DUPLEX ULTRASOUND TECHNIQUE: Wallace Cullens scale imaging, color Doppler and duplex ultrasound were performed of bilateral carotid and vertebral arteries in the neck. COMPARISON:  None. FINDINGS: Criteria: Quantification of carotid stenosis is based on velocity parameters that correlate the residual internal carotid diameter with NASCET-based stenosis levels, using the diameter of the distal internal carotid lumen as the denominator for stenosis measurement. The following velocity measurements were obtained: RIGHT ICA: Peak systolic velocity 107 cm/sec, End  diastolic velocity 19 cm/sec CCA: Peak systolic velocity 102 cm/sec SYSTOLIC ICA/CCA RATIO:  1.1 ECA: Peak systolic velocity 117 cm/sec LEFT ICA: Peak systolic velocity 117 cm/sec, End diastolic velocity 37 cm/sec CCA: 112 cm/sec SYSTOLIC ICA/CCA RATIO:  1.0 ECA: 116 cm/sec RIGHT CAROTID ARTERY: Moderate focal, echogenic atherosclerotic plaque formation at the carotid bulb. No significant tortuosity. Normal low resistance waveforms. RIGHT VERTEBRAL ARTERY:  Antegrade flow. LEFT CAROTID ARTERY: Moderate multifocal, echogenic atherosclerotic plaque formation at the carotid bulb and bifurcation. No significant tortuosity. Normal low resistance waveforms. LEFT VERTEBRAL ARTERY:  Antegrade flow. Upper extremity non-invasive blood pressures: Not obtained. IMPRESSION: 1. Right carotid artery system: Less than 50% stenosis secondary to moderate atherosclerotic plaque formation. 2. Left carotid artery system: Less than 50% stenosis secondary to moderate atherosclerotic plaque formation. 3.  Vertebral artery system: Patent with antegrade flow bilaterally. Marliss Coots, MD [facility]  [facility] Electronically Signed   By: Marliss Coots MD   On: 07/26/2020 11:10   DG Swallowing Func-Speech Pathology  Result Date: 07/26/2020 Formatting of this result is different from the original. Objective Swallowing Evaluation: Type of Study: Bedside Swallow Evaluation  Patient Details Name: OISIN YOAKUM MRN: [id number] Date of Birth: 09/16/1965 Today's Date: 07/26/2020 Time: SLP Start Time (ACUTE ONLY): 1531 -SLP Stop Time (ACUTE ONLY): 1558 SLP Time Calculation (min) (ACUTE ONLY): 27 min Past Medical History: Past Medical History: Diagnosis Date  Aplastic anemia, unspecified (HCC)   Chest pain, unspecified   Chronic airway obstruction, not elsewhere classified   Cocaine substance abuse (HCC)   Neuropathy   Other and unspecified hyperlipidemia   Shortness of breath   Stroke (HCC) 06/17/2020  Tobacco use disorder   Type II or unspecified type diabetes mellitus without mention of complication, not stated as uncontrolled   Unspecified epilepsy without mention of intractable epilepsy   Unspecified essential hypertension  Past Surgical History: Past Surgical History: Procedure Laterality Date  CARDIAC CATHETERIZATION  10/02/2010   Nonobstructive mild coronary plaque  KNEE SURGERY Bilateral 2018  Repair  PERFORATED VISCUS SURGERY    MULTIPLE FRACTURES HPI: Tavonte Seybold  is a 55 y.o. 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, patient presents to ED due to concerns of slurred speech, and is very poor historian, no family at bedside, was unable to reach significant other by phone, history was mainly obtained by phone and ED staff, reportedly patient states around 5 PM yesterday his significant other noted his speech to be changing, his speech continued to be slurred this morning, so she wanted that to get checked out, patient reports he is feeling generally weak. Patient endorses he smoked crack 07/24/20, reports he does it 1-2 times a week. MRI  brain showing acute CVA, ED physician discussed with Dr. Amada Jupiter from teleneurology, who suspect these findings are related to illicit drug use. BSE and SLE requested. Pt with hx of dysphagia see full MBSS report below:  No data recorded Assessment / Plan / Recommendation CHL IP CLINICAL IMPRESSIONS 07/26/2020 Clinical Impression Pt presents with moderate/severe sensorimotor oropharyngeal dypshagia characterized by moderate amounts of SILENT aspiration of thin liquids and Nectar thick liquids. Presentation today is similar to presentation on MBSS completed in 2020, with increased severity today in that NTL were tolerated safely on previous MBSS. Dysphagia today characterized by reduced posterior oral containment (Pt with previous uvulectomy per report) resulting in premature spillage of liquids into the pharynx and into the laryngeal vestibule resulting  in penetration and aspiration of thins and NTL down posterior tracheal wall that was not ever sensed. Pt with adequate hyolaryngeal excursion and minimal residuals in pharynx post swallow. SLP had Pt implement controlled bolus size, chin tuck, modified supraglottic, and oral bolus hold in attempt to eliminate penetration and aspiration, however none of these strategies were consistently effective. There was no penetration or aspiration of HTL and puree and solid textures were consumed without incident. Recommend regular diet with HONEY thick liquids with medications to be administered whole with HTL. Further note, it appears there was no compliance with NTL recommendation from MBSS in 2020 and family reports recurrent PNA. ST will continue to follow acutely. SLP Visit Diagnosis Dysphagia, oropharyngeal phase (R13.12) Attention and concentration deficit following -- Frontal lobe and executive function deficit following -- Impact on safety and function Moderate aspiration risk;Severe aspiration risk   CHL IP TREATMENT RECOMMENDATION 07/26/2020 Treatment  Recommendations Therapy as outlined in treatment plan below   Prognosis 07/26/2020 Prognosis for Safe Diet Advancement Guarded Barriers to Reach Goals Severity of deficits Barriers/Prognosis Comment -- CHL IP DIET RECOMMENDATION 07/26/2020 SLP Diet Recommendations Regular solids;Honey thick liquids Liquid Administration via Cup Medication Administration Whole meds with liquid Compensations Minimize environmental distractions;Slow rate Postural Changes Remain semi-upright after after feeds/meals (Comment)   CHL IP OTHER RECOMMENDATIONS 07/26/2020 Recommended Consults -- Oral Care Recommendations Oral care BID Other Recommendations Order thickener from pharmacy   CHL IP FOLLOW UP RECOMMENDATIONS 07/26/2020 Follow up Recommendations Skilled Nursing facility   [facility] IP FREQUENCY AND DURATION 07/26/2020 Speech Therapy Frequency (ACUTE ONLY) min 2x/week Treatment Duration 1 week      CHL IP ORAL PHASE 07/26/2020 Oral Phase Impaired Oral - Pudding Teaspoon -- Oral - Pudding Cup -- Oral - Honey Teaspoon WFL Oral - Honey Cup -- Oral - Nectar Teaspoon Premature spillage;Decreased bolus cohesion Oral - Nectar Cup Premature spillage;Decreased bolus cohesion Oral - Nectar Straw NT Oral - Thin Teaspoon Premature spillage;Decreased bolus cohesion Oral - Thin Cup Premature spillage;Decreased bolus cohesion Oral - Thin Straw NT Oral - Puree WFL Oral - Mech Soft NT Oral - Regular WFL Oral - Multi-Consistency NT Oral - Pill WFL Oral Phase - Comment --  CHL IP PHARYNGEAL PHASE 07/26/2020 Pharyngeal Phase Impaired Pharyngeal- Pudding Teaspoon -- Pharyngeal -- Pharyngeal- Pudding Cup -- Pharyngeal -- Pharyngeal- Honey Teaspoon -- Pharyngeal -- Pharyngeal- Honey Cup [facility] Pharyngeal Material does not enter airway Pharyngeal- Nectar Teaspoon -- Pharyngeal -- Pharyngeal- Nectar Cup Delayed swallow initiation-pyriform sinuses;Moderate aspiration;Pharyngeal residue - valleculae;Pharyngeal residue - pyriform;Reduced airway/laryngeal  closure;Penetration/Aspiration before swallow Pharyngeal Material enters airway, passes BELOW cords without attempt by patient to eject out (silent aspiration) Pharyngeal- Nectar Straw -- Pharyngeal -- Pharyngeal- Thin Teaspoon Delayed swallow initiation-pyriform sinuses;Moderate aspiration;Pharyngeal residue - valleculae;Pharyngeal residue - pyriform;Reduced airway/laryngeal closure;Penetration/Aspiration before swallow Pharyngeal Material enters airway, passes BELOW cords without attempt by patient to eject out (silent aspiration) Pharyngeal- Thin Cup Delayed swallow initiation-pyriform sinuses;Moderate aspiration;Pharyngeal residue - valleculae;Pharyngeal residue - pyriform;Reduced airway/laryngeal closure;Penetration/Aspiration before swallow Pharyngeal Material enters airway, passes BELOW cords without attempt by patient to eject out (silent aspiration) Pharyngeal- Thin Straw -- Pharyngeal -- Pharyngeal- Puree WFL Pharyngeal -- Pharyngeal- Mechanical Soft -- Pharyngeal -- Pharyngeal- Regular WFL Pharyngeal -- Pharyngeal- Multi-consistency -- Pharyngeal -- Pharyngeal- Pill WFL Pharyngeal -- Pharyngeal Comment --  CHL IP CERVICAL ESOPHAGEAL PHASE 07/26/2020 Cervical Esophageal Phase WFL Pudding Teaspoon -- Pudding Cup -- Honey Teaspoon -- Honey Cup -- Nectar Teaspoon -- Nectar Cup -- Nectar Straw -- Thin Teaspoon -- Thin Cup --  Thin Straw -- Puree -- Mechanical Soft -- Regular -- Multi-consistency -- Pill -- Cervical Esophageal Comment -- Amelia H. Romie LeveeYarbrough MA, CCC-SLP Speech Language Pathologist Georgetta Habermelia H Yarbrough 07/26/2020, 4:22 PM              ECHOCARDIOGRAM COMPLETE  Result Date: 07/27/2020    ECHOCARDIOGRAM REPORT   Patient Name:   Read DriversMOTHY C Pellicano Date of Exam: 07/26/2020 Medical Rec #:  [id number]         Height:       72.0 in Accession #:    [id number]        Weight:       198.6 lb Date of Birth:  [birth year]        BSA:          2.124 m Patient Age:    54 years          BP:           161/91 mmHg  Patient Gender: M                 HR:           83 bpm. [facility] Procedure: 2D Echo, Cardiac Doppler and Color Doppler Indications:    Stroke I63.9  History:        Patient has no prior history of Echocardiogram examinations.                 CAD, COPD and Stroke; Risk Factors:Diabetes, Hypertension and                 Dyslipidemia. Tobacco abuse, Cocaine substance abuse (HCC) (From                 Hx).  Sonographer:    Celesta GentileBernard White RCS Referring Phys: [phone] DAWOOD S ELGERGAWY IMPRESSIONS  1. Left ventricular ejection fraction, by estimation, is 65 to 70%. The left ventricle has normal function. The left ventricle has no regional wall motion abnormalities. There is mild left ventricular hypertrophy. Left ventricular diastolic parameters were normal.  2. Right ventricular systolic function is normal. The right ventricular size is normal. Tricuspid regurgitation signal is inadequate for assessing PA pressure.  3. The mitral valve is grossly normal. Trivial mitral valve regurgitation.  4. The aortic valve is tricuspid. Aortic valve regurgitation is not visualized.  5. The inferior vena cava is dilated in size with >50% respiratory variability, suggesting right atrial pressure of 8 mmHg. FINDINGS  Left Ventricle: Left ventricular ejection fraction, by estimation, is 65 to 70%. The left ventricle has normal function. The left ventricle has no regional wall motion abnormalities. The left ventricular internal cavity size was normal in size. There is  mild left ventricular hypertrophy. Left ventricular diastolic parameters were normal. Right Ventricle: The right ventricular size is normal. No increase in right ventricular wall thickness. Right ventricular systolic function is normal. Tricuspid regurgitation signal is inadequate for assessing PA pressure. Left Atrium: Left atrial size was normal in size. Right Atrium: Right atrial size was normal in size. Pericardium: There is no evidence of pericardial  effusion. Presence of pericardial fat pad. Mitral Valve: The mitral valve is grossly normal. Trivial mitral valve regurgitation. Tricuspid Valve: The tricuspid valve is grossly normal. Tricuspid valve regurgitation is trivial. Aortic Valve: The aortic valve is tricuspid. There is mild aortic valve annular calcification. Aortic valve regurgitation is not visualized. Pulmonic Valve: The pulmonic valve was not well visualized. Pulmonic valve regurgitation is trivial. Aorta: The aortic root  is normal in size and structure. Venous: The inferior vena cava is dilated in size with greater than 50% respiratory variability, suggesting right atrial pressure of 8 mmHg. IAS/Shunts: The interatrial septum appears to be lipomatous. No atrial level shunt detected by color flow Doppler.  LEFT VENTRICLE PLAX 2D LVIDd:         4.20 cm  Diastology LVIDs:         2.40 cm  LV e' medial:    9.90 cm/s LV PW:         1.10 cm  LV E/e' medial:  7.4 LV IVS:        1.10 cm  LV e' lateral:   8.70 cm/s LVOT diam:     2.10 cm  LV E/e' lateral: 8.4 LV SV:         93 LV SV Index:   44 LVOT Area:     3.46 cm  RIGHT VENTRICLE RV S prime:     15.40 cm/s TAPSE (M-mode): 1.8 cm LEFT ATRIUM             Index       RIGHT ATRIUM           Index LA diam:        4.10 cm 1.93 cm/m  RA Area:     17.90 cm LA Vol (A2C):   42.6 ml 20.05 ml/m RA Volume:   45.70 ml  21.51 ml/m LA Vol (A4C):   33.3 ml 15.68 ml/m LA Biplane Vol: 37.6 ml 17.70 ml/m  AORTIC VALVE LVOT Vmax:   126.00 cm/s LVOT Vmean:  77.800 cm/s LVOT VTI:    0.268 m  AORTA Ao Root diam: 3.70 cm MITRAL VALVE MV Area (PHT): 2.23 cm    SHUNTS MV Decel Time: 341 msec    Systemic VTI:  0.27 m MV E velocity: 73.00 cm/s  Systemic Diam: 2.10 cm MV A velocity: 74.05 cm/s MV E/A ratio:  0.99 Nona Dell MD Electronically signed by Nona Dell MD Signature Date/Time: 07/27/2020/9:32:16 AM    Final      CBC Recent Labs  Lab 07/25/20 1307  WBC 12.9*  HGB 12.6*  HCT 38.8*  PLT 260  MCV 102.6*   MCH 33.3  MCHC 32.5  RDW 12.9  LYMPHSABS 1.8  MONOABS 0.9  EOSABS 0.2  BASOSABS 0.1    Chemistries  Recent Labs  Lab 07/25/20 1307  NA 144  K 4.8  CL 108  CO2 31  GLUCOSE 126*  BUN 19  CREATININE 0.97  CALCIUM 9.8   ------------------------------------------------------------------------------------------------------------------ Recent Labs    07/26/20 0559  CHOL 250*  HDL 29*  LDLCALC 154*  TRIG 334*  CHOLHDL 8.6    Lab Results  Component Value Date   HGBA1C 7.0 (H) 07/26/2020   ------------------------------------------------------------------------------------------------------------------ No results for input(s): TSH, T4TOTAL, T3FREE, THYROIDAB in the last 72 hours.  Invalid input(s): FREET3 ------------------------------------------------------------------------------------------------------------------ No results for input(s): VITAMINB12, FOLATE, FERRITIN, TIBC, IRON, RETICCTPCT in the last 72 hours.  Coagulation profile No results for input(s): INR, PROTIME in the last 168 hours.  No results for input(s): DDIMER in the last 72 hours.  Cardiac Enzymes No results for input(s): CKMB, TROPONINI, MYOGLOBIN in the last 168 hours.  Invalid input(s): CK ------------------------------------------------------------------------------------------------------------------    Component Value Date/Time   BNP 107.0 (H) 11/03/2019 7673     [name] M.D on 07/27/2020 at 6:20 PM  Go to [email or web address] - for contact info  [facility] - Office  [phone]

## 2020-07-28 DIAGNOSIS — E0842 Diabetes mellitus due to underlying condition with diabetic polyneuropathy: Secondary | ICD-10-CM

## 2020-07-28 DIAGNOSIS — R1314 Dysphagia, pharyngoesophageal phase: Secondary | ICD-10-CM

## 2020-07-28 DIAGNOSIS — J441 Chronic obstructive pulmonary disease with (acute) exacerbation: Secondary | ICD-10-CM

## 2020-07-28 LAB — GLUCOSE, CAPILLARY
Glucose-Capillary: 173 mg/dL — ABNORMAL HIGH (ref 70–99)
Glucose-Capillary: 198 mg/dL — ABNORMAL HIGH (ref 70–99)
Glucose-Capillary: 172 mg/dL — ABNORMAL HIGH (ref 70–99)

## 2020-07-28 LAB — TSH: TSH: 0.807 u[IU]/mL (ref 0.350–4.500)

## 2020-07-28 LAB — VITAMIN B12: Vitamin B-12: 231 pg/mL (ref 180–914)

## 2020-07-28 MED ORDER — ALBUTEROL SULFATE (2.5 MG/3ML) 0.083% IN NEBU
2.5000 mg | INHALATION_SOLUTION | Freq: Two times a day (BID) | RESPIRATORY_TRACT | Status: DC
  Administered 2020-07-28 – 2020-07-29 (×2): 2.5 mg via RESPIRATORY_TRACT
  Filled 2020-07-28 (×2): qty 3

## 2020-07-28 MED ORDER — OXYCODONE HCL 5 MG PO TABS
5.0000 mg | ORAL_TABLET | Freq: Two times a day (BID) | ORAL | Status: DC | PRN
  Administered 2020-07-28 – 2020-07-29 (×2): 5 mg via ORAL
  Filled 2020-07-28 (×2): qty 1

## 2020-07-28 NOTE — Progress Notes (Signed)
Physical Therapy Treatment Patient Details Name: Brent [name] MRN: [id number] DOB: Jul 31, 1965 Today's Date: 07/28/2020    History of Present Illness Brent [name]  is a 55 y.o. 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, patient presents to ED due to concerns of slurred speech, and is very poor historian, no family at bedside, was unable to reach significant other by phone, history was mainly obtained by phone and ED staff, reportedly patient states around 5 PM yesterday his significant other noted his speech to be changing, his speech continued to be slurred this morning, so she wanted that to get checked out, patient reports he is feeling generally weak, but more in the left side, he denies any vision changes, any loss of consciousness, any confusion, any tingling, or numbness, patient endorses he smoked crack yesterday a friend, report he usually do it 1-2 times a week.    PT Comments    Patient demonstrates increased endurance/distance for gait training with slow slightly labored cadence with slightly decreased left ankle dorsiflexion, on 2 LPM O2, followed with w/c for safety, no loss of balance and requiring sitting rest break before returning to room.  Patient tolerated sitting up in chair after therapy with his daughter present in room - nursing staff aware.  Patient and his daughter informed that we recommend his spouse attend next treatment for family training in mobility and gait training.  Patient will benefit from continued physical therapy in hospital and recommended venue below to increase strength, balance, endurance for safe ADLs and gait.    Follow Up Recommendations  CIR     Equipment Recommendations  None recommended by PT    Recommendations for Other Services       Precautions / Restrictions Precautions Precautions: Fall Restrictions Weight Bearing Restrictions: No    Mobility  Bed Mobility Overal bed  mobility: Needs Assistance Bed Mobility: Supine to Sit     Supine to sit: Supervision;Min guard;HOB elevated     General bed mobility comments: increased time, labored movement    Transfers Overall transfer level: Needs assistance Equipment used: Rolling walker (2 wheeled) Transfers: Sit to/from [employer] Sit to Stand: Min assist Stand pivot transfers: Min assist       General transfer comment: slow labored movement, requires verbal cues for safety during standing to sitting due not reaching for armrest of w/c or chair  Ambulation/Gait Ambulation/Gait assistance: Min guard;Min assist Gait Distance (Feet): 75 Feet Assistive device: Rolling walker (2 wheeled) Gait Pattern/deviations: Decreased step length - right;Decreased step length - left;Decreased stride length;Decreased dorsiflexion - left Gait velocity: decreased   General Gait Details: demonstrates increased endurance/distance for ambulation with slow labored cadence, slightly decreased left ankle dorsilfexion without dragging of foot, limited mostly due to fatigue, followed with w/c for safety, on 2 LPM O2   Stairs             Wheelchair Mobility    Modified Rankin (Stroke Patients Only)       Balance Overall balance assessment: Needs assistance Sitting-balance support: Feet supported;No upper extremity supported Sitting balance-Leahy Scale: Fair Sitting balance - Comments: fair/good seated at EOB   Standing balance support: During functional activity;Bilateral upper extremity supported Standing balance-Leahy Scale: Fair Standing balance comment: using RW                            Cognition Arousal/Alertness: Awake/alert Behavior During Therapy: [facility]  for tasks assessed/performed Overall Cognitive Status: Within Functional Limits for tasks assessed                                        Exercises General Exercises - Lower Extremity Long Arc Quad:  Seated;AROM;Strengthening;Both;10 reps Hip Flexion/Marching: Seated;AROM;Strengthening;Both;10 reps Toe Raises: Seated;AROM;Strengthening;Both;10 reps Heel Raises: Seated;AROM;Strengthening;Both;15 reps    General Comments        Pertinent Vitals/Pain Pain Assessment: 0-10 Pain Score: 9  Pain Location: behind left ankle, mostly neuropathgy pain, "per patient" Pain Descriptors / Indicators: Sore;Discomfort Pain Intervention(s): Limited activity within patient's tolerance;Monitored during session;Repositioned    Home Living                      Prior Function            PT Goals (current goals can now be found in the care plan section) Acute Rehab PT Goals Patient Stated Goal: Go to rehab. PT Goal Formulation: With patient Time For Goal Achievement: 08/09/20 Potential to Achieve Goals: Good Progress towards PT goals: Progressing toward goals    Frequency    Min 5X/week      PT Plan Current plan remains appropriate    Co-evaluation              AM-PAC PT "6 Clicks" Mobility   Outcome Measure  Help needed turning from your back to your side while in a flat bed without using bedrails?: None Help needed moving from lying on your back to sitting on the side of a flat bed without using bedrails?: A Little Help needed moving to and from a bed to a chair (including a wheelchair)?: A Little Help needed standing up from a chair using your arms (e.g., wheelchair or bedside chair)?: A Lot Help needed to walk in hospital room?: A Little Help needed climbing 3-5 steps with a railing? : A Lot 6 Click Score: 17    End of Session Equipment Utilized During Treatment: Oxygen;Gait belt Activity Tolerance: Patient tolerated treatment well Patient left: in chair;with call bell/phone within reach;with family/visitor present Nurse Communication: Mobility status PT Visit Diagnosis: Unsteadiness on feet (R26.81);Other abnormalities of gait and mobility (R26.89);Muscle  weakness (generalized) (M62.81)     Time: 1610-9604 PT Time Calculation (min) (ACUTE ONLY): 31 min  Charges:  $Gait Training: 8-22 mins $Therapeutic Exercise: 8-22 mins                     11:13 AM, 07/28/20 Ocie Bob, MPT Physical Therapist with [facility] [phone] office [phone] mobile phone

## 2020-07-28 NOTE — Progress Notes (Signed)
Came in to give patient a breathing treatment, patient was on RA with a sat of 88%.  Placed Esparto back on patient and went up to 2.5L and patient has a sat of 90 to 91% at this time.  RN made aware, will continue to monitor.

## 2020-07-28 NOTE — Progress Notes (Signed)
Patient Demographics:    Brent [name], is a 55 y.o. male, DOB - 07/21/1965, ZOX:[id number]  Admit date - 07/25/2020   Admitting Physician Starleen Arms, MD  Outpatient Primary MD for the patient is [name], Brent Rhine, MD  LOS - 3   Chief Complaint  Patient presents with   Cerebrovascular Accident        Subjective:    Brent [name] is no fever, no chest pain, no nausea, no vomiting.  Continue to express improvement in his weakness, but with some difficulty getting on and initiating movement.  At home he has adequate equipment, but lack of man power to assist with activities of daily living. No new focal deficits seen.   Assessment  & Plan :    Principal Problem:   Acute CVA (cerebrovascular accident) [facility]) Active Problems:   CAD (coronary artery disease)   DM II (diabetes mellitus, type II), controlled (HCC)   Dysphagia-----s/p Prior Uvulectomy and Now with Acute CVA   Diabetic polyneuropathy associated with diabetes mellitus due to underlying condition (HCC)   COPD with acute exacerbation (HCC)  Brief Summary:- 55 y.o. male, with past medical history of HTN, HLD, DM, ,CAD ,previous CVA admitted on 07/25/2020 with slurred speech and concerns for new acute stroke in the setting of cocaine use  A/p 1) acute CVA----in the setting of ongoing crack cocaine use -Carotid artery Dopplers without hemodynamically significant stenosis -MRA head without LVO MRI Brain with Evolving infarcts in the posterior limb of the right internal capsule and left basal ganglia with mild edema and decreased conspicuity of diffusion signal abnormality -Speech and swallowing deficits persist -Generalized weakness improving -Echo with EF of 65 to 70%, no regional wall motion abnormalities -PT OT recommends CIR rehab -Continue aspirin Plavix and statin along with fenofibrate -Given improvement in patient's  neuromuscular deficits over the last 24 hours he would benefit from PT and OT reevaluation, if he continues to improve may be able to go home with home health rather than going to SNF  2)Dysphagia -----s/p Prior Uvulectomy and Now with Acute CVA -Speech eval and MBS notified -recommends Regular solids and Honey thickened liquids--patient is notoriously noncompliant with dietary modifications  3)COPD/Tobacco Abuse-- c/n bronchiodilator , not ready to quit  4)GERD--- c/n Protonix   5)HTN--  allow permissive HTN  6)DM2- Hold Metformin Use Novolog/Humalog Sliding scale insulin with Accu-Cheks/Fingersticks as ordered  7)Polysubstance--- pt admits to crack cocaine use, not ready to quit -Patient lives with a wife who is an alcoholic  Disposition/Need for in-Hospital Stay- patient unable to be discharged at this time due to acute CVA--now awaiting SNF/CIR placement --Given improvement in patient's neuromuscular deficits over the last 24 hours he would benefit from PT and OT reevaluation, if he continues to improve may be able to go home with home health rather than going to SNF  Status is: Inpatient  Remains inpatient appropriate because: see disposition  Disposition: The patient is from: Home              Anticipated d/c is to: SNF [facility]              Anticipated d/c date is: 2 days  Patient currently is medically stable to d/c.  Barriers: CIR has declined patient due to his insurance; Patient will short term rehab at [facility] in [place])  Code Status :  -  Code Status: Full Code   Family Communication:    NA (patient is alert, awake and coherent)   Consults  :  Neuro  DVT Prophylaxis  :   - SCDs  enoxaparin (LOVENOX) injection 40 mg Start: 07/25/20 1800    Lab Results  Component Value Date   PLT 260 07/25/2020    Inpatient Medications  Scheduled Meds:  albuterol  2.5 mg Nebulization Once   albuterol  2.5 mg Nebulization BID   aspirin EC  81 mg  Oral Daily   carbamazepine  400 mg Oral BID   clopidogrel  75 mg Oral Daily   enoxaparin (LOVENOX) injection  40 mg Subcutaneous Q24H   fenofibrate  160 mg Oral Daily   fluticasone furoate-vilanterol  1 puff Inhalation Daily   gabapentin  1,200 mg Oral QHS   gabapentin  900 mg Oral BID   insulin aspart  0-15 Units Subcutaneous TID WC   insulin aspart  0-5 Units Subcutaneous QHS   mouth rinse  15 mL Mouth Rinse BID   pantoprazole  40 mg Oral Daily   rosuvastatin  10 mg Oral Daily   Continuous Infusions:  sodium chloride 50 mL/hr at 07/27/20 2123   PRN Meds:.acetaminophen **OR** acetaminophen (TYLENOL) oral liquid 160 mg/5 mL **OR** acetaminophen, albuterol, alum & mag hydroxide-simeth, ondansetron (ZOFRAN) IV, senna-docusate   Anti-infectives (From admission, onward)    None         Objective:   Vitals:   07/28/20 0009 07/28/20 0403 07/28/20 0732 07/28/20 1408  BP: (!) 160/93 (!) 167/91  (!) 160/103  Pulse: 81 70  72  Resp: 20 20    Temp: 98.9 F (37.2 C) 97.6 F (36.4 C)  98 F (36.7 C)  TempSrc: Oral   Oral  SpO2: 96% 96% 96% 95%  Weight:      Height:        Wt Readings from Last 3 Encounters:  07/25/20 90.1 kg  07/08/20 89.4 kg  11/03/19 96.4 kg     Intake/Output Summary (Last 24 hours) at 07/28/2020 1704 Last data filed at 07/28/2020 1300 Gross per 24 hour  Intake 1577.57 ml  Output 1700 ml  Net -122.43 ml     Physical Exam General exam: Afebrile, no SOB, no CP, no nausea or vomiting. Continue to have dysphagia symptoms to thin liquids. And expressed left sided weakness.complaining of neuropathic pain. Respiratory system: Clear to auscultation. Respiratory effort normal. Cardiovascular system:RRR. No murmurs, rubs, gallops. Gastrointestinal system: Abdomen is nondistended, soft and nontender. No organomegaly or masses felt. Normal bowel sounds heard. Central nervous system: Alert and oriented. No new focal neurological deficits. Extremities: No  cyanosis, no clubbing.  Skin: No petechiae.  Psychiatry: Judgement and insight appear normal. Mood & affect appropriate.     Data Review:   Micro Results Recent Results (from the past 240 hour(s))  SARS CORONAVIRUS 2 (TAT 6-24 HRS) Nasopharyngeal Nasopharyngeal Swab     Status: None   Collection Time: 07/25/20  5:56 PM   Specimen: Nasopharyngeal Swab  Result Value Ref Range Status   SARS Coronavirus 2 NEGATIVE NEGATIVE Final    Comment: (NOTE) SARS-CoV-2 target nucleic acids are NOT DETECTED.  The SARS-CoV-2 RNA is generally detectable in upper and lower respiratory specimens during the acute phase of infection. Negative results  do not preclude SARS-CoV-2 infection, do not rule out co-infections with other pathogens, and should not be used as the sole basis for treatment or other patient management decisions. Negative results must be combined with clinical observations, patient history, and epidemiological information. The expected result is Negative.  Fact Sheet for Patients: HairSlick.no  Fact Sheet for Healthcare Providers: quierodirigir.com  This test is not yet approved or cleared by the [place] FDA and  has been authorized for detection and/or diagnosis of SARS-CoV-2 by FDA under an Emergency Use Authorization (EUA). This EUA will remain  in effect (meaning this test can be used) for the duration of the COVID-19 declaration under Se ction 564(b)(1) of the Act, 21 U.S.C. section 360bbb-3(b)(1), unless the authorization is terminated or revoked sooner.  Performed at [facility], [address], [place], [place] [address]     Radiology Reports MR ANGIO HEAD WO CONTRAST  Result Date: 07/25/2020 CLINICAL DATA:  Follow-up strokes in both hemispheres. EXAM: MRA HEAD WITHOUT CONTRAST TECHNIQUE: Angiographic images of the Circle of Willis were acquired using MRA technique without intravenous contrast.  COMPARISON:  MRI earlier today. FINDINGS: Anterior circulation: Both internal carotid arteries widely patent through the skull base and siphon regions. The anterior and middle cerebral vessels show flow without proximal stenosis. More distal vessels poorly seen because of motion degradation. Posterior circulation: Both vertebral arteries widely patent to the basilar. No basilar stenosis. Flow present in both superior cerebellar and posterior cerebral arteries. Distal branch vessels poorly seen because of motion. Anatomic variants: None significant. Other: None. IMPRESSION: Motion degraded study. No large vessel occlusion or correctable proximal stenosis. Electronically Signed   By: Paulina Fusi M.D.   On: 07/25/2020 18:14   MR Brain Wo Contrast (neuro protocol)  Result Date: 07/25/2020 CLINICAL DATA:  Neuro deficit, acute stroke suspected. EXAM: MRI HEAD WITHOUT CONTRAST TECHNIQUE: Multiplanar, multiecho pulse sequences of the brain and surrounding structures were obtained without intravenous contrast. COMPARISON:  Jun 18, 2020. FINDINGS: Motion limited evaluation.  Within this limitation: Brain: Evolving infarcts in the posterior limb of the right internal capsule and left basal ganglia with mild edema and decreased conspicuity of diffusion signal abnormality. No evidence of new/interval acute infarct. Remote right frontal infarct with encephalomalacia. Additional scattered T2/FLAIR hyperintensities within the white matter, compatible with advanced chronic microvascular ischemic disease. Similar atrophy with ex vacuo ventricular dilation. No hydrocephalus. No extra-axial fluid collection. No mass lesion or abnormal mass effect. Vascular: Major arterial flow voids are maintained at the skull base. Skull and upper cervical spine: Normal marrow signal. Sinuses/Orbits: Mild scattered paranasal sinus mucosal thickening. Unremarkable orbits. Other: No sizable mastoid effusions.  MRI IMPRESSION: 1. Evolving infarcts in  the posterior limb of the right internal capsule and left basal ganglia with mild edema and decreased conspicuity of diffusion signal abnormality. No evidence of new/interval acute infarct on this motion limited exam. 2. Advanced chronic microvascular ischemic disease. Electronically Signed   By: Feliberto Harts MD   On: 07/25/2020 14:14   US Carotid Bilateral (at [facility] and [facility] only)  Result Date: 07/26/2020 CLINICAL DATA:  55 year old male with history of stroke. EXAM: BILATERAL CAROTID DUPLEX ULTRASOUND TECHNIQUE: Wallace Cullens scale imaging, color Doppler and duplex ultrasound were performed of bilateral carotid and vertebral arteries in the neck. COMPARISON:  None. FINDINGS: Criteria: Quantification of carotid stenosis is based on velocity parameters that correlate the residual internal carotid diameter with NASCET-based stenosis levels, using the diameter of the distal internal carotid lumen as the  denominator for stenosis measurement. The following velocity measurements were obtained: RIGHT ICA: Peak systolic velocity 107 cm/sec, End diastolic velocity 19 cm/sec CCA: Peak systolic velocity 102 cm/sec SYSTOLIC ICA/CCA RATIO:  1.1 ECA: Peak systolic velocity 117 cm/sec LEFT ICA: Peak systolic velocity 117 cm/sec, End diastolic velocity 37 cm/sec CCA: 112 cm/sec SYSTOLIC ICA/CCA RATIO:  1.0 ECA: 116 cm/sec RIGHT CAROTID ARTERY: Moderate focal, echogenic atherosclerotic plaque formation at the carotid bulb. No significant tortuosity. Normal low resistance waveforms. RIGHT VERTEBRAL ARTERY:  Antegrade flow. LEFT CAROTID ARTERY: Moderate multifocal, echogenic atherosclerotic plaque formation at the carotid bulb and bifurcation. No significant tortuosity. Normal low resistance waveforms. LEFT VERTEBRAL ARTERY:  Antegrade flow. Upper extremity non-invasive blood pressures: Not obtained. IMPRESSION: 1. Right carotid artery system: Less than 50% stenosis secondary to moderate atherosclerotic plaque formation. 2. Left carotid  artery system: Less than 50% stenosis secondary to moderate atherosclerotic plaque formation. 3.  Vertebral artery system: Patent with antegrade flow bilaterally. Marliss Coots, MD [facility] Electronically Signed   By: Marliss Coots MD   On: 07/26/2020 11:10   DG Swallowing Func-Speech Pathology  Result Date: 07/26/2020 Formatting of this result is different from the original. Objective Swallowing Evaluation: Type of Study: Bedside Swallow Evaluation  Patient Details Name: DADRIAN BALLANTINE MRN: [id number] Date of Birth: March 22, 1965 Today's Date: 07/26/2020 Time: SLP Start Time (ACUTE ONLY): 1531 -SLP Stop Time (ACUTE ONLY): 1558 SLP Time Calculation (min) (ACUTE ONLY): 27 min Past Medical History: Past Medical History: Diagnosis Date  Aplastic anemia, unspecified (HCC)   Chest pain, unspecified   Chronic airway obstruction, not elsewhere classified   Cocaine substance abuse (HCC)   Neuropathy   Other and unspecified hyperlipidemia   Shortness of breath   Stroke (HCC) 06/17/2020  Tobacco use disorder   Type II or unspecified type diabetes mellitus without mention of complication, not stated as uncontrolled   Unspecified epilepsy without mention of intractable epilepsy   Unspecified essential hypertension  Past Surgical History: Past Surgical History: Procedure Laterality Date  CARDIAC CATHETERIZATION  10/02/2010   Nonobstructive mild coronary plaque  KNEE SURGERY Bilateral 2018  Repair  PERFORATED VISCUS SURGERY    MULTIPLE FRACTURES HPI: Kwamaine Cuppett  is a 55 y.o. 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, patient presents to ED due to concerns of slurred speech, and is very poor historian, no family at bedside, was unable to reach significant other by phone, history was mainly obtained by phone and ED staff, reportedly patient states around 5 PM yesterday his significant other noted his speech to be  changing, his speech continued to be slurred this morning, so she wanted that to get checked out, patient reports he is feeling generally weak. Patient endorses he smoked crack 07/24/20, reports he does it 1-2 times a week. MRI brain showing acute CVA, ED physician discussed with Dr. Amada Jupiter from teleneurology, who suspect these findings are related to illicit drug use. BSE and SLE requested. Pt with hx of dysphagia see full MBSS report below:  No data recorded Assessment / Plan / Recommendation CHL IP CLINICAL IMPRESSIONS 07/26/2020 Clinical Impression Pt presents with moderate/severe sensorimotor oropharyngeal dypshagia characterized by moderate amounts of SILENT aspiration of thin liquids and Nectar thick liquids. Presentation today is similar to presentation on MBSS completed in 2020, with increased severity today in that NTL were tolerated safely on previous MBSS. Dysphagia today characterized by reduced posterior oral containment (Pt with previous  uvulectomy per report) resulting in premature spillage of liquids into the pharynx and into the laryngeal vestibule resulting in penetration and aspiration of thins and NTL down posterior tracheal wall that was not ever sensed. Pt with adequate hyolaryngeal excursion and minimal residuals in pharynx post swallow. SLP had Pt implement controlled bolus size, chin tuck, modified supraglottic, and oral bolus hold in attempt to eliminate penetration and aspiration, however none of these strategies were consistently effective. There was no penetration or aspiration of HTL and puree and solid textures were consumed without incident. Recommend regular diet with HONEY thick liquids with medications to be administered whole with HTL. Further note, it appears there was no compliance with NTL recommendation from MBSS in 2020 and family reports recurrent PNA. ST will continue to follow acutely. SLP Visit Diagnosis Dysphagia, oropharyngeal phase (R13.12) Attention and  concentration deficit following -- Frontal lobe and executive function deficit following -- Impact on safety and function Moderate aspiration risk;Severe aspiration risk   CHL IP TREATMENT RECOMMENDATION 07/26/2020 Treatment Recommendations Therapy as outlined in treatment plan below   Prognosis 07/26/2020 Prognosis for Safe Diet Advancement Guarded Barriers to Reach Goals Severity of deficits Barriers/Prognosis Comment -- CHL IP DIET RECOMMENDATION 07/26/2020 SLP Diet Recommendations Regular solids;Honey thick liquids Liquid Administration via Cup Medication Administration Whole meds with liquid Compensations Minimize environmental distractions;Slow rate Postural Changes Remain semi-upright after after feeds/meals (Comment)   CHL IP OTHER RECOMMENDATIONS 07/26/2020 Recommended Consults -- Oral Care Recommendations Oral care BID Other Recommendations Order thickener from pharmacy   CHL IP FOLLOW UP RECOMMENDATIONS 07/26/2020 Follow up Recommendations [facility] D[facility] 07/26/2020 Speech Therapy Frequency (ACUTE ONLY) min 2x/week Treatment Duration 1 week      CHL IP ORAL PHASE 07/26/2020 Oral Phase Impaired Oral - Pudding Teaspoon -- Oral - Pudding Cup -- Oral - Honey Teaspoon WFL Oral - Honey Cup -- Oral - Nectar Teaspoon Premature spillage;Decreased bolus cohesion Oral - Nectar Cup Premature spillage;Decreased bolus cohesion Oral - Nectar Straw NT Oral - Thin Teaspoon Premature spillage;Decreased bolus cohesion Oral - Thin Cup Premature spillage;Decreased bolus cohesion Oral - Thin Straw NT Oral - Puree WFL Oral - Mech Soft NT Oral - Regular WFL Oral - Multi-Consistency NT Oral - Pill WFL Oral Phase - Comment --  CHL IP PHARYNGEAL PHASE 07/26/2020 Pharyngeal Phase Impaired Pharyngeal- Pudding Teaspoon -- Pharyngeal -- Pharyngeal- Pudding Cup -- Pharyngeal -- Pharyngeal- Honey Teaspoon -- Pharyngeal -- Pharyngeal- [facility] Cup [facility] Material does not enter airway Pharyngeal-  Nectar Teaspoon -- Pharyngeal -- Pharyngeal- Nectar Cup Delayed swallow initiation-pyriform sinuses;Moderate aspiration;Pharyngeal residue - valleculae;Pharyngeal residue - pyriform;Reduced airway/laryngeal closure;Penetration/Aspiration before swallow Pharyngeal Material enters airway, passes BELOW cords without attempt by patient to eject out (silent aspiration) Pharyngeal- Nectar Straw -- Pharyngeal -- Pharyngeal- Thin Teaspoon Delayed swallow initiation-pyriform sinuses;Moderate aspiration;Pharyngeal residue - valleculae;Pharyngeal residue - pyriform;Reduced airway/laryngeal closure;Penetration/Aspiration before swallow Pharyngeal Material enters airway, passes BELOW cords without attempt by patient to eject out (silent aspiration) Pharyngeal- Thin Cup Delayed swallow initiation-pyriform sinuses;Moderate aspiration;Pharyngeal residue - valleculae;Pharyngeal residue - pyriform;Reduced airway/laryngeal closure;Penetration/Aspiration before swallow Pharyngeal Material enters airway, passes BELOW cords without attempt by patient to eject out (silent aspiration) Pharyngeal- Thin Straw -- Pharyngeal -- Pharyngeal- Puree WFL Pharyngeal -- Pharyngeal- Mechanical Soft -- Pharyngeal -- Pharyngeal- Regular WFL Pharyngeal -- Pharyngeal- Multi-consistency -- Pharyngeal -- Pharyngeal- Pill WFL Pharyngeal -- Pharyngeal Comment --  CHL IP CERVICAL ESOPHAGEAL PHASE 07/26/2020 Cervical Esophageal Phase WFL Pudding Teaspoon -- Pudding Cup -- Honey Teaspoon --  Honey Cup -- Nectar Teaspoon -- Nectar Cup -- Nectar Straw -- Thin Teaspoon -- Thin Cup -- Thin Straw -- Puree -- Mechanical Soft -- Regular -- Multi-consistency -- Pill -- Cervical Esophageal Comment -- Amelia H. Romie Levee, CCC-SLP Speech Language Pathologist Georgetta Haber 07/26/2020, 4:22 PM              ECHOCARDIOGRAM COMPLETE  Result Date: 07/27/2020    ECHOCARDIOGRAM REPORT   Patient Name:   TAMARIUS ROSENFIELD Date of Exam: 07/26/2020 Medical Rec #:  [id number]          Height:       72.0 in Accession #:    [id number]        Weight:       198.6 lb Date of Birth:  1965-04-03        BSA:          2.124 m Patient Age:    54 years          BP:           161/91 mmHg Patient Gender: M                 HR:           83 bpm. [facility] Procedure: 2D Echo, Cardiac Doppler and Color Doppler Indications:    Stroke I63.9  History:        Patient has no prior history of Echocardiogram examinations.                 CAD, COPD and Stroke; Risk Factors:Diabetes, Hypertension and                 Dyslipidemia. Tobacco abuse, Cocaine substance abuse (HCC) (From                 Hx).  Sonographer:    Celesta Gentile RCS Referring Phys: [phone] [place]WOOD S [place] IMPRESSIONS  1. Left ventricular ejection fraction, by estimation, is 65 to 70%. The left ventricle has normal function. The left ventricle has no regional wall motion abnormalities. There is mild left ventricular hypertrophy. Left ventricular diastolic parameters were normal.  2. Right ventricular systolic function is normal. The right ventricular size is normal. Tricuspid regurgitation signal is inadequate for assessing PA pressure.  3. The mitral valve is grossly normal. Trivial mitral valve regurgitation.  4. The aortic valve is tricuspid. Aortic valve regurgitation is not visualized.  5. The inferior vena cava is dilated in size with >50% respiratory variability, suggesting right atrial pressure of 8 mmHg. FINDINGS  Left Ventricle: Left ventricular ejection fraction, by estimation, is 65 to 70%. The left ventricle has normal function. The left ventricle has no regional wall motion abnormalities. The left ventricular internal cavity size was normal in size. There is  mild left ventricular hypertrophy. Left ventricular diastolic parameters were normal. Right Ventricle: The right ventricular size is normal. No increase in right ventricular wall thickness. Right ventricular systolic function is normal. Tricuspid regurgitation  signal is inadequate for assessing PA pressure. Left Atrium: Left atrial size was normal in size. Right Atrium: Right atrial size was normal in size. Pericardium: There is no evidence of pericardial effusion. Presence of pericardial fat pad. Mitral Valve: The mitral valve is grossly normal. Trivial mitral valve regurgitation. Tricuspid Valve: The tricuspid valve is grossly normal. Tricuspid valve regurgitation is trivial. Aortic Valve: The aortic valve is tricuspid. There is mild aortic valve annular calcification. Aortic valve regurgitation is not visualized.  Pulmonic Valve: The pulmonic valve was not well visualized. Pulmonic valve regurgitation is trivial. Aorta: The aortic root is normal in size and structure. Venous: The inferior vena cava is dilated in size with greater than 50% respiratory variability, suggesting right atrial pressure of 8 mmHg. IAS/Shunts: The interatrial septum appears to be lipomatous. No atrial level shunt detected by color flow Doppler.  LEFT VENTRICLE PLAX 2D LVIDd:         4.20 cm  Diastology LVIDs:         2.40 cm  LV e' medial:    9.90 cm/s LV PW:         1.10 cm  LV E/e' medial:  7.4 LV IVS:        1.10 cm  LV e' lateral:   8.70 cm/s LVOT diam:     2.10 cm  LV E/e' lateral: 8.4 LV SV:         93 LV SV Index:   44 LVOT Area:     3.46 cm  RIGHT VENTRICLE RV S prime:     15.40 cm/s TAPSE (M-mode): 1.8 cm LEFT ATRIUM             Index       RIGHT ATRIUM           Index LA diam:        4.10 cm 1.93 cm/m  RA Area:     17.90 cm LA Vol (A2C):   42.6 ml 20.05 ml/m RA Volume:   45.70 ml  21.51 ml/m LA Vol (A4C):   33.3 ml 15.68 ml/m LA Biplane Vol: 37.6 ml 17.70 ml/m  AORTIC VALVE LVOT Vmax:   126.00 cm/s LVOT Vmean:  77.800 cm/s LVOT VTI:    0.268 m  AORTA Ao Root diam: 3.70 cm MITRAL VALVE MV Area (PHT): 2.23 cm    SHUNTS MV Decel Time: 341 msec    Systemic VTI:  0.27 m MV E velocity: 73.00 cm/s  Systemic Diam: 2.10 cm MV A velocity: 74.05 cm/s MV E/A ratio:  0.99 Nona Dell MD  Electronically signed by Nona Dell MD Signature Date/Time: 07/27/2020/9:32:16 AM    Final      CBC Recent Labs  Lab 07/25/20 1307  WBC 12.9*  HGB 12.6*  HCT 38.8*  PLT 260  MCV 102.6*  MCH 33.3  MCHC 32.5  RDW 12.9  LYMPHSABS 1.8  MONOABS 0.9  EOSABS 0.2  BASOSABS 0.1    Chemistries  Recent Labs  Lab 07/25/20 1307  NA 144  K 4.8  CL 108  CO2 31  GLUCOSE 126*  BUN 19  CREATININE 0.97  CALCIUM 9.8   ------------------------------------------------------------------------------------------------------------------ Recent Labs    07/26/20 0559  CHOL 250*  HDL 29*  LDLCALC 154*  TRIG 334*  CHOLHDL 8.6    Lab Results  Component Value Date   HGBA1C 7.0 (H) 07/26/2020   ------------------------------------------------------------------------------------------------------------------ Recent Labs    07/28/20 0557  TSH 0.807   ------------------------------------------------------------------------------------------------------------------ Recent Labs    07/28/20 0557  VITAMINB12 231    ------------------------------------------------------------------------------------------------------------------    Component Value Date/Time   BNP 107.0 (H) 11/03/2019 1610    Vassie Loll M.D on 07/28/2020 at 5:04 PM  Go to [email or web address] - for contact info  [facility] - Office  [phone]

## 2020-07-28 NOTE — TOC Progression Note (Signed)
Transition of Care [facility]) - Progression Note    Patient Details  Name: Brent [name] MRN: [id number] Date of Birth: [birth year]  Transition [facility] Care [facility] CM/SW Contact  Barry Brunner, LCSW Phone Number: 07/28/2020, 11:25 AM  Clinical Narrative:    PT reported that patient may be appropriate for [facility]. Pt and family informed that it was pt is a difficult placement due to ins. PT reported that patient's mobility is improving and may be appropriate for [facility] and will be reassessed tomorrow for [facility] consideration. CSW followed up with SNF referrals. Revonda Standard with [facility] reported that they will review patient, but male beds are limited. TOC to follow.     Barriers to Discharge: Continued Medical Work up  Expected Discharge Plan and Services                                                 Social Determinants of Health (SDOH) Interventions    Readmission Risk Interventions No flowsheet data found.

## 2020-07-29 DIAGNOSIS — F1410 Cocaine abuse, uncomplicated: Secondary | ICD-10-CM

## 2020-07-29 LAB — GLUCOSE, CAPILLARY
Glucose-Capillary: 193 mg/dL — ABNORMAL HIGH (ref 70–99)
Glucose-Capillary: 134 mg/dL — ABNORMAL HIGH (ref 70–99)
Glucose-Capillary: 183 mg/dL — ABNORMAL HIGH (ref 70–99)

## 2020-07-29 LAB — HOMOCYSTEINE: Homocysteine: 18.1 umol/L — ABNORMAL HIGH (ref 0.0–14.5)

## 2020-07-29 LAB — RPR: RPR Ser Ql: NONREACTIVE

## 2020-07-29 MED ORDER — FOOD THICKENER (SIMPLYTHICK HONEY)
1.0000 | ORAL | Status: DC | PRN
  Filled 2020-07-29: qty 1

## 2020-07-29 MED ORDER — ROSUVASTATIN CALCIUM 20 MG PO TABS: 20.0000 mg | ORAL_TABLET | Freq: Every day | ORAL | 2 refills | Status: DC

## 2020-07-29 MED ORDER — FOOD THICKENER (SIMPLYTHICK HONEY): 1.0000 | ORAL | 2 refills | Status: DC | PRN

## 2020-07-29 MED ORDER — CYANOCOBALAMIN 1000 MCG/ML IJ SOLN
1000.0000 ug | Freq: Once | INTRAMUSCULAR | Status: AC
  Administered 2020-07-29: 1000 ug via INTRAMUSCULAR
  Filled 2020-07-29: qty 1

## 2020-07-29 NOTE — Progress Notes (Signed)
Nsg Discharge Note  Admit Date:  07/25/2020 Discharge date: 07/29/2020   Read Drivers to be D/C'd Home with [facility] and PT per MD order.  AVS completed.  Copy for chart, and copy for patient signed, and dated. Patient/caregiver able to verbalize understanding.  Discharge Medication: Allergies as of 07/29/2020   No Known Allergies      Medication List     STOP taking these medications    atorvastatin 80 MG tablet Commonly known as: LIPITOR   Breztri Aerosphere 160-9-4.8 MCG/ACT Aero Generic drug: Budeson-Glycopyrrol-Formoterol   metFORMIN 1000 MG tablet Commonly known as: GLUCOPHAGE   tiZANidine 4 MG tablet Commonly known as: ZANAFLEX       TAKE these medications    albuterol 108 (90 Base) MCG/ACT inhaler Commonly known as: VENTOLIN HFA Inhale 2 puffs into the lungs every 6 (six) hours as needed for wheezing or shortness of breath.   albuterol (2.5 MG/3ML) 0.083% nebulizer solution Commonly known as: PROVENTIL Take 2.5 mg by nebulization every 4 (four) hours as needed for wheezing or shortness of breath.   alclomethasone 0.05 % cream Commonly known as: ACLOVATE Apply 1 application topically 2 (two) times daily.   aspirin EC 81 MG tablet Take 81 mg by mouth daily. Swallow whole.   benazepril 10 MG tablet Commonly known as: LOTENSIN Take 20 mg by mouth daily.   budesonide-formoterol 160-4.5 MCG/ACT inhaler Commonly known as: SYMBICORT Inhale 2 puffs into the lungs in the morning and at bedtime.   carbamazepine 400 MG 12 hr tablet Commonly known as: TEGRETOL XR Take 1 tablet (400 mg total) by mouth 2 (two) times daily.   clopidogrel 75 MG tablet Commonly known as: PLAVIX Take 75 mg by mouth daily.   dextromethorphan-guaiFENesin 30-600 MG 12hr tablet Commonly known as: MUCINEX DM Take 1 tablet by mouth 2 (two) times daily.   escitalopram 10 MG tablet Commonly known as: LEXAPRO Take 10 mg by mouth daily.   fenofibrate 160 MG tablet Take 48 mg by mouth  daily.   Fish Oil 1000 MG Caps Take 500 mg by mouth 2 (two) times daily.   food thickener Liqd Commonly known as: SIMPLYTHICK (HONEY/LEVEL 3/MODERATELY THICK) Take 1 packet by mouth as needed.   gabapentin 300 MG capsule Commonly known as: NEURONTIN Take 3 caps in AM, 3 caps at noon, 4 caps at bedtime What changed:  how to take this when to take this additional instructions   insulin glargine 100 UNIT/ML injection Commonly known as: LANTUS Inject 70 Units into the skin daily.   Janumet 50-1000 MG tablet Generic drug: sitaGLIPtin-metformin Take 1 tablet by mouth 2 (two) times daily.   metoprolol tartrate 50 MG tablet Commonly known as: LOPRESSOR Take 50 mg by mouth 2 (two) times daily.   omeprazole 40 MG capsule Commonly known as: PRILOSEC Take 1 capsule (40 mg total) by mouth 2 (two) times daily.   rosuvastatin 20 MG tablet Commonly known as: CRESTOR Take 1 tablet (20 mg total) by mouth daily. Start taking on: July 30, 2020        Discharge Assessment: Vitals:   07/29/20 1046 07/29/20 1324  BP: (!) 144/100 (!) 165/87  Pulse: 79 70  Resp: 18 20  Temp: 97.8 F (36.6 C) 98.1 F (36.7 C)  SpO2: 95% 96%   Skin clean, dry and intact without evidence of skin break down, no evidence of skin tears noted. IV catheter discontinued intact. Site without signs and symptoms of complications - no redness or edema noted  at insertion site, patient denies c/o pain - only slight tenderness at site.  Dressing with slight pressure applied.  D/c Instructions-Education: Discharge instructions given to patient/family with verbalized understanding. D/c education completed with patient/family including follow up instructions, medication list, d/c activities limitations if indicated, with other d/c instructions as indicated by MD - patient able to verbalize understanding, all questions fully answered. Patient instructed to return to ED, call 911, or call MD for any changes in condition.   Patient escorted via WC, and D/C home via private auto.  Carole Civil, RN 07/29/2020 4:07 PM

## 2020-07-29 NOTE — Discharge Summary (Addendum)
Physician Discharge Summary  Brent [name] MEQ:[id number] DOB: [birth year] DOA: 07/25/2020  PCP: Beatrix Fetters, MD  Admit date: 07/25/2020 Discharge date: 07/29/2020  Time spent: 35 minutes  Recommendations for Outpatient Follow-up:  Repeat basic metabolic panel and electrolytes Pending Reassess blood pressure and adjust antihypertensive regimen as needed. Continue to closely follow CBGs and A1c with further adjustment to hypoglycemia regimen as required. Continue assisting patient with tobacco and polysubstance abuse cessation  Discharge Diagnoses:  Principal Problem:   Acute CVA (cerebrovascular accident) (HCC) Active Problems:   CAD (coronary artery disease)   DM II (diabetes mellitus, type II), controlled (HCC)   Dysphagia-----s/p Prior Uvulectomy and Now with Acute CVA   Diabetic polyneuropathy associated with diabetes mellitus due to underlying condition (HCC)   COPD, chronic.   Cocaine abuse (HCC)   Discharge Condition: Stable.  Discharge summary contractions PCP in 2 weeks.  Current status: Full code.  Diet recommendation: Regular consistency (solids) diet with honey thick liquids; modified carbohydrate and heart healthy.  Filed Weights   07/25/20 1147 07/25/20 1827  Weight: 127 kg 90.1 kg    History of present illness:  As per H&P written by Dr. Randol Kern on 07/26/2018 Brent [name]  is a 55 y.o. 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, patient presents to ED due to concerns of slurred speech, and is very poor historian, no family at bedside, was unable to reach significant other by phone, history was mainly obtained by phone and ED staff, reportedly patient states around 5 PM yesterday his significant other noted his speech to be changing, his speech continued to be slurred this morning, so she wanted that to get checked out, patient reports he is feeling generally weak, but more in the left side, he denies any  vision changes, any loss of consciousness, any confusion, any tingling, or numbness, patient endorses he smoked crack yesterday a friend, report he usually do it 1-2 times a week. -NAD MRI brain showing acute CVA, , ED physician discussed with Dr. Amada Jupiter from teleneurology, who suspect these findings are related to illicit drug use, and actually upon asking patient reports he did smoke crack, and his urine drug screen came positive for cocaine, there is no intervention needed, and patient can stay at [facility] for further work-up, [facility] hospitalist consulted to admit.  Hospital Course:  1) acute CVA----in the setting of ongoing crack cocaine use -Carotid artery Dopplers without hemodynamically significant stenosis -MRA head without LVO MRI Brain with Evolving infarcts in the posterior limb of the right internal capsule and left basal ganglia with mild edema and decreased conspicuity of diffusion signal abnormality -Speech and swallowing deficits persist -Generalized weakness continue improving -Echo with EF of 65 to 70%, no regional wall motion abnormalities -PT OT recommends HHPT and HHRN -Continue aspirin Plavix for secondary prevention -continue the use of statins and control BP  2)Dysphagia -- Pharyngoesophageal in the setting of acute CVA -Speech eval and MBS provided -regular consistency with honey thick liquids recommended.  -patient is notoriously noncompliant with dietary modifications.   3)COPD/Tobacco Abuse- no acute exacerbation seen. -continue home bronchodilator regimen -patient encouraged to quit smoking -cessation counseling provided.   4)GERD  -continue Protonix   5)HTN- -Permissive HTN allowed -resumption of home antihypertensive regimen instructed -continue to follow heart healthy diet.    6)DM2 -Resume home oral hypoglycemic regimen. -Continue close monitoring of CBGs and A1c.   7)Polysubstance--- pt admits to crack cocaine use, not  ready to quit. But  will make an effort. -cessation counseling provided -TOC to provide outpatient resources that can assist him quitting.   Procedures: See below for x-ray reports   Consultations: Neurology   Discharge Exam: Vitals:   07/29/20 1046 07/29/20 1324  BP: (!) 144/100 (!) 165/87  Pulse: 79 70  Resp: 18 20  Temp: 97.8 F (36.6 C) 98.1 F (36.7 C)  SpO2: 95% 96%    General: Afebrile, no shortness of breath, no chest pain, no nausea vomiting acute reports feeling better and demonstrating increased strengthening.  Still having difficulties tolerating thin liquids and is complaining of neuropathic pain affecting his legs. Cardiovascular: RRR, no gallops, no gallops, no JVD appreciated on exam. Respiratory: Positive scattered rhonchi; symptoms.  Consults gastroenterology Abdomen: Soft, nontender, nondistended, positive bowel sounds Extremities: No cyanosis or clubbing   Discharge Instructions   Discharge Instructions     Diet - low sodium heart healthy   Complete by: As directed    Discharge instructions   Complete by: As directed    Take medications as prescribed.   Maintain adequate hydration. Arrange follow up with PCP in 10 days Follow honey thick liquids as instructed to minimize risk of dysphagia.      Allergies as of 07/29/2020   No Known Allergies      Medication List     STOP taking these medications    atorvastatin 80 MG tablet Commonly known as: LIPITOR   Breztri Aerosphere 160-9-4.8 MCG/ACT Aero Generic drug: Budeson-Glycopyrrol-Formoterol   metFORMIN 1000 MG tablet Commonly known as: GLUCOPHAGE   tiZANidine 4 MG tablet Commonly known as: ZANAFLEX       TAKE these medications    albuterol 108 (90 Base) MCG/ACT inhaler Commonly known as: VENTOLIN HFA Inhale 2 puffs into the lungs every 6 (six) hours as needed for wheezing or shortness of breath.   albuterol (2.5 MG/3ML) 0.083% nebulizer solution Commonly known as: PROVENTIL Take 2.5 mg by  nebulization every 4 (four) hours as needed for wheezing or shortness of breath.   alclomethasone 0.05 % cream Commonly known as: ACLOVATE Apply 1 application topically 2 (two) times daily.   aspirin EC 81 MG tablet Take 81 mg by mouth daily. Swallow whole.   benazepril 10 MG tablet Commonly known as: LOTENSIN Take 20 mg by mouth daily.   budesonide-formoterol 160-4.5 MCG/ACT inhaler Commonly known as: SYMBICORT Inhale 2 puffs into the lungs in the morning and at bedtime.   carbamazepine 400 MG 12 hr tablet Commonly known as: TEGRETOL XR Take 1 tablet (400 mg total) by mouth 2 (two) times daily.   clopidogrel 75 MG tablet Commonly known as: PLAVIX Take 75 mg by mouth daily.   dextromethorphan-guaiFENesin 30-600 MG 12hr tablet Commonly known as: MUCINEX DM Take 1 tablet by mouth 2 (two) times daily.   escitalopram 10 MG tablet Commonly known as: LEXAPRO Take 10 mg by mouth daily.   fenofibrate 160 MG tablet Take 48 mg by mouth daily.   Fish Oil 1000 MG Caps Take 500 mg by mouth 2 (two) times daily.   food thickener Liqd Commonly known as: SIMPLYTHICK (HONEY/LEVEL 3/MODERATELY THICK) Take 1 packet by mouth as needed.   gabapentin 300 MG capsule Commonly known as: NEURONTIN Take 3 caps in AM, 3 caps at noon, 4 caps at bedtime What changed:  how to take this when to take this additional instructions   insulin glargine 100 UNIT/ML injection Commonly known as: LANTUS Inject 70 Units into the skin daily.  Janumet 50-1000 MG tablet Generic drug: sitaGLIPtin-metformin Take 1 tablet by mouth 2 (two) times daily.   metoprolol tartrate 50 MG tablet Commonly known as: LOPRESSOR Take 50 mg by mouth 2 (two) times daily.   omeprazole 40 MG capsule Commonly known as: PRILOSEC Take 1 capsule (40 mg total) by mouth 2 (two) times daily.   rosuvastatin 20 MG tablet Commonly known as: CRESTOR Take 1 tablet (20 mg total) by mouth daily. Start taking on: July 30, 2020        No Known Allergies  Follow-up Information     [facility].   Specialty: Home Health Services Why: RN and PT Contact information: [address] [place] [address] [phone]         Beatrix Fetters, MD. Schedule an appointment as soon as possible for a visit in 2 week(s).   Specialty: Family Medicine Contact information: [address] [place] [address] [phone]                 The results of significant diagnostics from this hospitalization (including imaging, microbiology, ancillary and laboratory) are listed below for reference.    Significant Diagnostic Studies: MR ANGIO HEAD WO CONTRAST  Result Date: 07/25/2020 CLINICAL DATA:  Follow-up strokes in both hemispheres. EXAM: MRA HEAD WITHOUT CONTRAST TECHNIQUE: Angiographic images of the Circle of Willis were acquired using MRA technique without intravenous contrast. COMPARISON:  MRI earlier today. FINDINGS: Anterior circulation: Both internal carotid arteries widely patent through the skull base and siphon regions. The anterior and middle cerebral vessels show flow without proximal stenosis. More distal vessels poorly seen because of motion degradation. Posterior circulation: Both vertebral arteries widely patent to the basilar. No basilar stenosis. Flow present in both superior cerebellar and posterior cerebral arteries. Distal branch vessels poorly seen because of motion. Anatomic variants: None significant. Other: None. IMPRESSION: Motion degraded study. No large vessel occlusion or correctable proximal stenosis. Electronically Signed   By: Paulina Fusi M.D.   On: 07/25/2020 18:14   MR Brain Wo Contrast (neuro protocol)  Result Date: 07/25/2020 CLINICAL DATA:  Neuro deficit, acute stroke suspected. EXAM: MRI HEAD WITHOUT CONTRAST TECHNIQUE: Multiplanar, multiecho pulse sequences of the brain and surrounding structures were obtained without intravenous contrast. COMPARISON:  Jun 18, 2020. FINDINGS: Motion limited evaluation.  Within this limitation: Brain: Evolving infarcts in the posterior limb of the right internal capsule and left basal ganglia with mild edema and decreased conspicuity of diffusion signal abnormality. No evidence of new/interval acute infarct. Remote right frontal infarct with encephalomalacia. Additional scattered T2/FLAIR hyperintensities within the white matter, compatible with advanced chronic microvascular ischemic disease. Similar atrophy with ex vacuo ventricular dilation. No hydrocephalus. No extra-axial fluid collection. No mass lesion or abnormal mass effect. Vascular: Major arterial flow voids are maintained at the skull base. Skull and upper cervical spine: Normal marrow signal. Sinuses/Orbits: Mild scattered paranasal sinus mucosal thickening. Unremarkable orbits. Other: No sizable mastoid effusions.  MRI IMPRESSION: 1. Evolving infarcts in the posterior limb of the right internal capsule and left basal ganglia with mild edema and decreased conspicuity of diffusion signal abnormality. No evidence of new/interval acute infarct on this motion limited exam. 2. Advanced chronic microvascular ischemic disease. Electronically Signed   By: Feliberto Harts MD   On: 07/25/2020 14:14   US Carotid Bilateral (at [facility] and [facility] only)  Result Date: 07/26/2020 CLINICAL DATA:  55 year old male with history of stroke. EXAM: BILATERAL CAROTID DUPLEX ULTRASOUND TECHNIQUE: Wallace Cullens  scale imaging, color Doppler and duplex ultrasound were performed of bilateral carotid and vertebral arteries in the neck. COMPARISON:  None. FINDINGS: Criteria: Quantification of carotid stenosis is based on velocity parameters that correlate the residual internal carotid diameter with NASCET-based stenosis levels, using the diameter of the distal internal carotid lumen as the denominator for stenosis measurement. The following velocity measurements were obtained: RIGHT ICA: Peak systolic velocity 107  cm/sec, End diastolic velocity 19 cm/sec CCA: Peak systolic velocity 102 cm/sec SYSTOLIC ICA/CCA RATIO:  1.1 ECA: Peak systolic velocity 117 cm/sec LEFT ICA: Peak systolic velocity 117 cm/sec, End diastolic velocity 37 cm/sec CCA: 112 cm/sec SYSTOLIC ICA/CCA RATIO:  1.0 ECA: 116 cm/sec RIGHT CAROTID ARTERY: Moderate focal, echogenic atherosclerotic plaque formation at the carotid bulb. No significant tortuosity. Normal low resistance waveforms. RIGHT VERTEBRAL ARTERY:  Antegrade flow. LEFT CAROTID ARTERY: Moderate multifocal, echogenic atherosclerotic plaque formation at the carotid bulb and bifurcation. No significant tortuosity. Normal low resistance waveforms. LEFT VERTEBRAL ARTERY:  Antegrade flow. Upper extremity non-invasive blood pressures: Not obtained. IMPRESSION: 1. Right carotid artery system: Less than 50% stenosis secondary to moderate atherosclerotic plaque formation. 2. Left carotid artery system: Less than 50% stenosis secondary to moderate atherosclerotic plaque formation. 3.  Vertebral artery system: Patent with antegrade flow bilaterally. Marliss Coots, MD [facility]iology Electronically Signed   By: Marliss Coots MD   On: 07/26/2020 11:10   DG Swallowing Func-Speech Pathology  Result Date: 07/26/2020 Formatting of this result is different from the original. Objective Swallowing Evaluation: Type of Study: Bedside Swallow Evaluation  Patient Details Name: Brent [name] MRN: [id number] Date of Birth: [birth year] Today's Date: 07/26/2020 Time: SLP Start Time (ACUTE ONLY): 1531 -SLP Stop Time (ACUTE ONLY): 1558 SLP Time Calculation (min) (ACUTE ONLY): 27 min Past Medical History: Past Medical History: Diagnosis Date  Aplastic anemia, unspecified (HCC)   Chest pain, unspecified   Chronic airway obstruction, not elsewhere classified   Cocaine substance abuse (HCC)   Neuropathy   Other and unspecified hyperlipidemia   Shortness of breath   Stroke  (HCC) 06/17/2020  Tobacco use disorder   Type II or unspecified type diabetes mellitus without mention of complication, not stated as uncontrolled   Unspecified epilepsy without mention of intractable epilepsy   Unspecified essential hypertension  Past Surgical History: Past Surgical History: Procedure Laterality Date  CARDIAC CATHETERIZATION  10/02/2010   Nonobstructive mild coronary plaque  KNEE SURGERY Bilateral 2018  Repair  PERFORATED VISCUS SURGERY    MULTIPLE FRACTURES HPI: Brent [name]  is a 55 y.o. 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, patient presents to ED due to concerns of slurred speech, and is very poor historian, no family at bedside, was unable to reach significant other by phone, history was mainly obtained by phone and ED staff, reportedly patient states around 5 PM yesterday his significant other noted his speech to be changing, his speech continued to be slurred this morning, so she wanted that to get checked out, patient reports he is feeling generally weak. Patient endorses he smoked crack 07/24/20, reports he does it 1-2 times a week. MRI brain showing acute CVA, ED physician discussed with Dr. Amada Jupiter from teleneurology, who suspect these findings are related to illicit drug use. BSE and SLE requested. Pt with hx of dysphagia see full MBSS report below:  No data recorded Assessment / Plan / Recommendation CHL IP CLINICAL IMPRESSIONS 07/26/2020 Clinical Impression Pt presents  with moderate/severe sensorimotor oropharyngeal dypshagia characterized by moderate amounts of SILENT aspiration of thin liquids and Nectar thick liquids. Presentation today is similar to presentation on MBSS completed in 2020, with increased severity today in that NTL were tolerated safely on previous MBSS. Dysphagia today characterized by reduced posterior oral containment (Pt with previous uvulectomy per report) resulting in premature spillage of liquids  into the pharynx and into the laryngeal vestibule resulting in penetration and aspiration of thins and NTL down posterior tracheal wall that was not ever sensed. Pt with adequate hyolaryngeal excursion and minimal residuals in pharynx post swallow. SLP had Pt implement controlled bolus size, chin tuck, modified supraglottic, and oral bolus hold in attempt to eliminate penetration and aspiration, however none of these strategies were consistently effective. There was no penetration or aspiration of HTL and puree and solid textures were consumed without incident. Recommend regular diet with HONEY thick liquids with medications to be administered whole with HTL. Further note, it appears there was no compliance with NTL recommendation from MBSS in 2020 and family reports recurrent PNA. ST will continue to follow acutely. SLP Visit Diagnosis Dysphagia, oropharyngeal phase (R13.12) Attention and concentration deficit following -- Frontal lobe and executive function deficit following -- Impact on safety and function Moderate aspiration risk;Severe aspiration risk   CHL IP TREATMENT RECOMMENDATION 07/26/2020 Treatment Recommendations Therapy as outlined in treatment plan below   Prognosis 07/26/2020 Prognosis for Safe Diet Advancement Guarded Barriers to Reach Goals Severity of deficits Barriers/Prognosis Comment -- CHL IP DIET RECOMMENDATION 07/26/2020 SLP Diet Recommendations Regular solids;Honey thick liquids Liquid Administration via Cup Medication Administration Whole meds with liquid Compensations Minimize environmental distractions;Slow rate Postural Changes Remain semi-upright after after feeds/meals (Comment)   CHL IP OTHER RECOMMENDATIONS 07/26/2020 Recommended Consults -- Oral Care Recommendations Oral care BID Other Recommendations Order thickener from pharmacy   CHL IP FOLLOW UP RECOMMENDATIONS 07/26/2020 Follow up Recommendations [facility]QU[facility] D[facility] 07/26/2020 Speech Therapy  Frequency (ACUTE ONLY) min 2x/week Treatment Duration 1 week      CHL IP ORAL PHASE 07/26/2020 Oral Phase Impaired Oral - Pudding Teaspoon -- Oral - Pudding Cup -- Oral - Honey Teaspoon WFL Oral - Honey Cup -- Oral - Nectar Teaspoon Premature spillage;Decreased bolus cohesion Oral - Nectar Cup Premature spillage;Decreased bolus cohesion Oral - Nectar Straw NT Oral - Thin Teaspoon Premature spillage;Decreased bolus cohesion Oral - Thin Cup Premature spillage;Decreased bolus cohesion Oral - Thin Straw NT Oral - Puree WFL Oral - Mech Soft NT Oral - Regular WFL Oral - Multi-Consistency NT Oral - Pill WFL Oral Phase - Comment --  CHL IP PHARYNGEAL PHASE 07/26/2020 Pharyngeal Phase Impaired Pharyngeal- Pudding Teaspoon -- Pharyngeal -- Pharyngeal- Pudding Cup -- Pharyngeal -- Pharyngeal- Honey Teaspoon -- Pharyngeal -- Pharyngeal- Honey Cup [facility] Material does not enter airway Pharyngeal- Nectar Teaspoon -- Pharyngeal -- Pharyngeal- Nectar Cup Delayed swallow initiation-pyriform sinuses;Moderate aspiration;Pharyngeal residue - valleculae;Pharyngeal residue - pyriform;Reduced airway/laryngeal closure;Penetration/Aspiration before swallow Pharyngeal Material enters airway, passes BELOW cords without attempt by patient to eject out (silent aspiration) Pharyngeal- Nectar Straw -- Pharyngeal -- Pharyngeal- Thin Teaspoon Delayed swallow initiation-pyriform sinuses;Moderate aspiration;Pharyngeal residue - valleculae;Pharyngeal residue - pyriform;Reduced airway/laryngeal closure;Penetration/Aspiration before swallow Pharyngeal Material enters airway, passes BELOW cords without attempt by patient to eject out (silent aspiration) Pharyngeal- Thin Cup Delayed swallow initiation-pyriform sinuses;Moderate aspiration;Pharyngeal residue - valleculae;Pharyngeal residue - pyriform;Reduced airway/laryngeal closure;Penetration/Aspiration before swallow Pharyngeal Material enters airway, passes BELOW cords without attempt by patient  to eject out (silent  aspiration) Pharyngeal- Thin Straw -- Pharyngeal -- Pharyngeal- Puree WFL Pharyngeal -- Pharyngeal- Mechanical Soft -- Pharyngeal -- Pharyngeal- Regular WFL Pharyngeal -- Pharyngeal- Multi-consistency -- Pharyngeal -- Pharyngeal- Pill WFL Pharyngeal -- Pharyngeal Comment --  CHL IP CERVICAL ESOPHAGEAL PHASE 07/26/2020 Cervical Esophageal Phase WFL Pudding Teaspoon -- Pudding Cup -- Honey Teaspoon -- Honey Cup -- Nectar Teaspoon -- Nectar Cup -- Nectar Straw -- Thin Teaspoon -- Thin Cup -- Thin Straw -- Puree -- Mechanical Soft -- Regular -- Multi-consistency -- Pill -- Cervical Esophageal Comment -- Amelia H. Romie Levee, CCC-SLP Speech Language Pathologist Georgetta Haber 07/26/2020, 4:22 PM              ECHOCARDIOGRAM COMPLETE  Result Date: 07/27/2020    ECHOCARDIOGRAM REPORT   Patient Name:   Brent [name] Date of Exam: 07/26/2020 Medical Rec #:  [id number]         Height:       72.0 in Accession #:    [id number]        Weight:       198.6 lb Date of Birth:  01-20-1966        BSA:          2.124 m Patient Age:    54 years          BP:           161/91 mmHg Patient Gender: M                 HR:           83 bpm. [facility] Procedure: 2D Echo, Cardiac Doppler and Color Doppler Indications:    Stroke I63.9  History:        Patient has no prior history of Echocardiogram examinations.                 CAD, COPD and Stroke; Risk Factors:Diabetes, Hypertension and                 Dyslipidemia. Tobacco abuse, Cocaine substance abuse (HCC) (From                 Hx).  Sonographer:    Celesta Gentile RCS Referring Phys: [phone] [place]WO[place] S [place] IMPRESSIONS  1. Left ventricular ejection fraction, by estimation, is 65 to 70%. The left ventricle has normal function. The left ventricle has no regional wall motion abnormalities. There is mild left ventricular hypertrophy. Left ventricular diastolic parameters were normal.  2. Right ventricular systolic function is normal. The right  ventricular size is normal. Tricuspid regurgitation signal is inadequate for assessing PA pressure.  3. The mitral valve is grossly normal. Trivial mitral valve regurgitation.  4. The aortic valve is tricuspid. Aortic valve regurgitation is not visualized.  5. The inferior vena cava is dilated in size with >50% respiratory variability, suggesting right atrial pressure of 8 mmHg. FINDINGS  Left Ventricle: Left ventricular ejection fraction, by estimation, is 65 to 70%. The left ventricle has normal function. The left ventricle has no regional wall motion abnormalities. The left ventricular internal cavity size was normal in size. There is  mild left ventricular hypertrophy. Left ventricular diastolic parameters were normal. Right Ventricle: The right ventricular size is normal. No increase in right ventricular wall thickness. Right ventricular systolic function is normal. Tricuspid regurgitation signal is inadequate for assessing PA pressure. Left Atrium: Left atrial size was normal in size. Right Atrium: Right atrial size was normal in size. Pericardium: There is  no evidence of pericardial effusion. Presence of pericardial fat pad. Mitral Valve: The mitral valve is grossly normal. Trivial mitral valve regurgitation. Tricuspid Valve: The tricuspid valve is grossly normal. Tricuspid valve regurgitation is trivial. Aortic Valve: The aortic valve is tricuspid. There is mild aortic valve annular calcification. Aortic valve regurgitation is not visualized. Pulmonic Valve: The pulmonic valve was not well visualized. Pulmonic valve regurgitation is trivial. Aorta: The aortic root is normal in size and structure. Venous: The inferior vena cava is dilated in size with greater than 50% respiratory variability, suggesting right atrial pressure of 8 mmHg. IAS/Shunts: The interatrial septum appears to be lipomatous. No atrial level shunt detected by color flow Doppler.  LEFT VENTRICLE PLAX 2D LVIDd:         4.20 cm  Diastology  LVIDs:         2.40 cm  LV e' medial:    9.90 cm/s LV PW:         1.10 cm  LV E/e' medial:  7.4 LV IVS:        1.10 cm  LV e' lateral:   8.70 cm/s LVOT diam:     2.10 cm  LV E/e' lateral: 8.4 LV SV:         93 LV SV Index:   44 LVOT Area:     3.46 cm  RIGHT VENTRICLE RV S prime:     15.40 cm/s TAPSE (M-mode): 1.8 cm LEFT ATRIUM             Index       RIGHT ATRIUM           Index LA diam:        4.10 cm 1.93 cm/m  RA Area:     17.90 cm LA Vol (A2C):   42.6 ml 20.05 ml/m RA Volume:   45.70 ml  21.51 ml/m LA Vol (A4C):   33.3 ml 15.68 ml/m LA Biplane Vol: 37.6 ml 17.70 ml/m  AORTIC VALVE LVOT Vmax:   126.00 cm/s LVOT Vmean:  77.800 cm/s LVOT VTI:    0.268 m  AORTA Ao Root diam: 3.70 cm MITRAL VALVE MV Area (PHT): 2.23 cm    SHUNTS MV Decel Time: 341 msec    Systemic VTI:  0.27 m MV E velocity: 73.00 cm/s  Systemic Diam: 2.10 cm MV A velocity: 74.05 cm/s MV E/A ratio:  0.99 Nona Dell MD Electronically signed by Nona Dell MD Signature Date/Time: 07/27/2020/9:32:16 AM    Final     Microbiology: Recent Results (from the past 240 hour(s))  SARS CORONAVIRUS 2 (TAT 6-24 HRS) Nasopharyngeal Nasopharyngeal Swab     Status: None   Collection Time: 07/25/20  5:56 PM   Specimen: Nasopharyngeal Swab  Result Value Ref Range Status   SARS Coronavirus 2 NEGATIVE NEGATIVE Final    Comment: (NOTE) SARS-CoV-2 target nucleic acids are NOT DETECTED.  The SARS-CoV-2 RNA is generally detectable in upper and lower respiratory specimens during the acute phase of infection. Negative results do not preclude SARS-CoV-2 infection, do not rule out co-infections with other pathogens, and should not be used as the sole basis for treatment or other patient management decisions. Negative results must be combined with clinical observations, patient history, and epidemiological information. The expected result is Negative.  Fact Sheet for Patients: HairSlick.no  Fact Sheet for  Healthcare Providers: quierodirigir.com  This test is not yet approved or cleared by the [place] FDA and  has been authorized for detection and/or diagnosis of  SARS-CoV-2 by FDA under an Emergency Use Authorization (EUA). This EUA will remain  in effect (meaning this test can be used) for the duration of the COVID-19 declaration under Se ction 564(b)(1) of the Act, 21 U.S.C. section 360bbb-3(b)(1), unless the authorization is terminated or revoked sooner.  Performed at [facility], [address] [place], [place] [address]      Labs: Basic Metabolic Panel: Recent Labs  Lab 07/25/20 1307  NA 144  K 4.8  CL 108  CO2 31  GLUCOSE 126*  BUN 19  CREATININE 0.97  CALCIUM 9.8    Recent Labs  Lab 07/25/20 1307  AMMONIA 23   CBC: Recent Labs  Lab 07/25/20 1307  WBC 12.9*  NEUTROABS 9.9*  HGB 12.6*  HCT 38.8*  MCV 102.6*  PLT 260    BNP (last 3 results) Recent Labs    11/03/19 0918  BNP 107.0*   CBG: Recent Labs  Lab 07/28/20 0815 07/28/20 1119 07/28/20 1638 07/29/20 0727 07/29/20 1112  GLUCAP 173* 198* 172* 134* 183*    Signed:  Vassie Loll MD.  [facility] 07/29/2020, 3:30 PM

## 2020-07-29 NOTE — Progress Notes (Signed)
Physical Therapy Treatment Patient Details Name: Brent [name] MRN: [id number] DOB: 1965-10-07 Today's Date: 07/29/2020    History of Present Illness Brent [name]  is a 55 y.o. 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, patient presents to ED due to concerns of slurred speech, and is very poor historian, no family at bedside, was unable to reach significant other by phone, history was mainly obtained by phone and ED staff, reportedly patient states around 5 PM yesterday his significant other noted his speech to be changing, his speech continued to be slurred this morning, so she wanted that to get checked out, patient reports he is feeling generally weak, but more in the left side, he denies any vision changes, any loss of consciousness, any confusion, any tingling, or numbness, patient endorses he smoked crack yesterday a friend, report he usually do it 1-2 times a week.    PT Comments    Patient's spouse and daughter present for family training in transfers, bed mobility and gait.  Patient demonstrates good return for getting into/out of bed, but has to use bed rail for supine to sitting (has hospital bed at home), bed/chair transfers, ambulated in room/hallway without loss of balance while followed with w/c for safety, on room air with SpO2 at 98% and did not require sitting rest break before returning to room.  Patient's family observed and acknowledged understanding for assisting patient.  Patient will benefit from continued physical therapy in hospital and recommended venue below to increase strength, balance, endurance for safe ADLs and gait.    Follow Up Recommendations  Home health PT;Supervision for mobility/OOB;Supervision - Intermittent     Equipment Recommendations  None recommended by PT    Recommendations for Other Services       Precautions / Restrictions Precautions Precautions: Fall Restrictions Weight Bearing  Restrictions: No    Mobility  Bed Mobility Overal bed mobility: Needs Assistance Bed Mobility: Supine to Sit;Sit to Supine     Supine to sit: Supervision Sit to supine: Supervision   General bed mobility comments: demonstrates good return for getting into/out of bed without hands on assit, but had to use bed rail    Transfers Overall transfer level: Needs assistance Equipment used: Rolling walker (2 wheeled) Transfers: Sit to/from [employer] Sit to Stand: Supervision;Min guard Stand pivot transfers: Supervision;Min guard       General transfer comment: slightly labored movement, increased time  Ambulation/Gait Ambulation/Gait assistance: Supervision;Min guard Gait Distance (Feet): 120 Feet Assistive device: Rolling walker (2 wheeled) Gait Pattern/deviations: Decreased step length - right;Decreased step length - left;Decreased stride length;Decreased dorsiflexion - left Gait velocity: decreased   General Gait Details: increased endurance/distance for ambulation with decreased left ankle dorsiflexion possibly due to c/o neuropathy pain behind left ankle, no loss of balance, on room air with SpO2 at 98% after ambulation, did not require sitting rest break in w/c   Stairs             Wheelchair Mobility    Modified Rankin (Stroke Patients Only)       Balance Overall balance assessment: Needs assistance Sitting-balance support: Feet supported;No upper extremity supported Sitting balance-Leahy Scale: Good Sitting balance - Comments: seated at EOB   Standing balance support: During functional activity;Bilateral upper extremity supported Standing balance-Leahy Scale: Fair Standing balance comment: fair/good using RW  Cognition Arousal/Alertness: Awake/alert Behavior During Therapy: WFL for tasks assessed/performed Overall Cognitive Status: Within Functional Limits for tasks assessed                                         Exercises      General Comments        Pertinent Vitals/Pain Pain Assessment: Faces Faces Pain Scale: Hurts little more Pain Location: behind left ankle, mostly neuropathgy pain, "per patient" Pain Descriptors / Indicators: Sore;Discomfort    Home Living                      Prior Function            PT Goals (current goals can now be found in the care plan section) Acute Rehab PT Goals Patient Stated Goal: return home with family to assist PT Goal Formulation: With patient/family Time For Goal Achievement: 08/01/20 Potential to Achieve Goals: Good Progress towards PT goals: Progressing toward goals    Frequency    Min 3X/week      PT Plan Discharge plan needs to be updated    Co-evaluation              AM-PAC PT "6 Clicks" Mobility   Outcome Measure  Help needed turning from your back to your side while in a flat bed without using bedrails?: None Help needed moving from lying on your back to sitting on the side of a flat bed without using bedrails?: A Little Help needed moving to and from a bed to a chair (including a wheelchair)?: A Little Help needed standing up from a chair using your arms (e.g., wheelchair or bedside chair)?: A Little Help needed to walk in hospital room?: A Little Help needed climbing 3-5 steps with a railing? : A Little 6 Click Score: 19    End of Session   Activity Tolerance: Patient tolerated treatment well;Patient limited by fatigue Patient left: in chair;with call bell/phone within reach;with family/visitor present Nurse Communication: Mobility status PT Visit Diagnosis: Unsteadiness on feet (R26.81);Other abnormalities of gait and mobility (R26.89);Muscle weakness (generalized) (M62.81)     Time: 0940-1005 PT Time Calculation (min) (ACUTE ONLY): 25 min  Charges:  $Gait Training: 8-22 mins $Therapeutic Activity: 8-22 mins                     10:24 AM, 07/29/20 Ocie Bob,  MPT Physical Therapist with [facility] [phone] office [phone] mobile phone

## 2020-07-29 NOTE — TOC Transition Note (Addendum)
Transition of Care [facility]) - CM/SW Discharge Note   Patient Details  Name: Brent [name] MRN: [id number] Date of Birth: 07-28-1965  [facility] of [facility]) CM/SW Contact:  Barry Brunner, LCSW Phone Number: 07/29/2020, 2:21 PM   Clinical Narrative:    CSW notified the family of the bed offer made by [facility]. Patient and family declined SNF due to distance. CSW notified Delorise Shiner with [facility]. CSW placed referral for [facility] with Clifton Custard from [employer]. Clifton Custard agreeable to provide [facility] services. TOC signing off.   Final next level of care: Home w Home Health Services Barriers to Discharge: Barriers Resolved   Patient Goals and CMS Choice Patient states their goals for this hospitalization and ongoing recovery are:: return home with [facility] CMS Medicare.gov Compare Post Acute Care list provided to:: Patient Choice offered to / list presented to : Patient  Discharge Placement                    Patient and family notified of of transfer: 07/29/20  Discharge Plan and Services                          HH Arranged: RN, PT [facility] Agency: [facility] Date [facility] Agency Contacted: 07/29/20 Time HH Agency Contacted: 1420 Representative spoke with at [facility] Agency: Clifton Custard  Social Determinants of Health (SDOH) Interventions     Readmission Risk Interventions No flowsheet data found.

## 2020-08-12 ENCOUNTER — Other Ambulatory Visit: Payer: Self-pay

## 2020-08-12 ENCOUNTER — Emergency Department (HOSPITAL_COMMUNITY): Payer: Medicaid Other

## 2020-08-12 ENCOUNTER — Emergency Department (HOSPITAL_COMMUNITY)
Admission: EM | Admit: 2020-08-12 | Discharge: 2020-08-12 | Disposition: A | Discharge status: Home / Self Care | Payer: Medicaid Other | Attending: Emergency Medicine | Admitting: Emergency Medicine

## 2020-08-12 DIAGNOSIS — J441 Chronic obstructive pulmonary disease with (acute) exacerbation: Secondary | ICD-10-CM | POA: Insufficient documentation

## 2020-08-12 DIAGNOSIS — R0989 Other specified symptoms and signs involving the circulatory and respiratory systems: Secondary | ICD-10-CM | POA: Insufficient documentation

## 2020-08-12 DIAGNOSIS — Z79899 Other long term (current) drug therapy: Secondary | ICD-10-CM | POA: Diagnosis not present

## 2020-08-12 DIAGNOSIS — E119 Type 2 diabetes mellitus without complications: Secondary | ICD-10-CM | POA: Insufficient documentation

## 2020-08-12 DIAGNOSIS — F17210 Nicotine dependence, cigarettes, uncomplicated: Secondary | ICD-10-CM | POA: Diagnosis not present

## 2020-08-12 DIAGNOSIS — I119 Hypertensive heart disease without heart failure: Secondary | ICD-10-CM | POA: Insufficient documentation

## 2020-08-12 DIAGNOSIS — R059 Cough, unspecified: Secondary | ICD-10-CM

## 2020-08-12 DIAGNOSIS — Z7982 Long term (current) use of aspirin: Secondary | ICD-10-CM | POA: Diagnosis not present

## 2020-08-12 DIAGNOSIS — Z794 Long term (current) use of insulin: Secondary | ICD-10-CM | POA: Insufficient documentation

## 2020-08-12 LAB — COMPREHENSIVE METABOLIC PANEL
ALT: 13 U/L (ref 0–44)
AST: 12 U/L — ABNORMAL LOW (ref 15–41)
Albumin: 3.8 g/dL (ref 3.5–5.0)
Alkaline Phosphatase: 68 U/L (ref 38–126)
Anion gap: 8 (ref 5–15)
BUN: 12 mg/dL (ref 6–20)
CO2: 29 mmol/L (ref 22–32)
Calcium: 9.2 mg/dL (ref 8.9–10.3)
Chloride: 105 mmol/L (ref 98–111)
Creatinine, Ser: 0.85 mg/dL (ref 0.61–1.24)
GFR, Estimated: >60 mL/min (ref >60)
Glucose, Bld: 202 mg/dL — ABNORMAL HIGH (ref 70–99)
Potassium: 4.3 mmol/L (ref 3.5–5.1)
Sodium: 142 mmol/L (ref 135–145)
Total Bilirubin: 0.4 mg/dL (ref 0.3–1.2)
Total Protein: 6.8 g/dL (ref 6.5–8.1)

## 2020-08-12 LAB — CBC WITH DIFFERENTIAL/PLATELET
Abs Immature Granulocytes: 0.04 10*3/uL (ref 0.00–0.07)
Basophils Absolute: 0.1 10*3/uL (ref 0.0–0.1)
Basophils Relative: 1 %
Eosinophils Absolute: 0.2 10*3/uL (ref 0.0–0.5)
Eosinophils Relative: 2 %
HCT: 40.8 % (ref 39.0–52.0)
Hemoglobin: 13.6 g/dL (ref 13.0–17.0)
Immature Granulocytes: 1 %
Lymphocytes Relative: 15 %
Lymphs Abs: 1.3 10*3/uL (ref 0.7–4.0)
MCH: 32.5 pg (ref 26.0–34.0)
MCHC: 33.3 g/dL (ref 30.0–36.0)
MCV: 97.6 fL (ref 80.0–100.0)
Monocytes Absolute: 0.4 10*3/uL (ref 0.1–1.0)
Monocytes Relative: 4 %
Neutro Abs: 6.8 10*3/uL (ref 1.7–7.7)
Neutrophils Relative %: 77 %
Platelets: 269 10*3/uL (ref 150–400)
RBC: 4.18 MIL/uL — ABNORMAL LOW (ref 4.22–5.81)
RDW: 13 % (ref 11.5–15.5)
WBC: 8.8 10*3/uL (ref 4.0–10.5)
nRBC: 0 % (ref 0.0–0.2)

## 2020-08-12 IMAGING — DX DG CHEST 1V PORT
1 series · 1 of 1 positions shown · non-contrast
Comparison: [DATE]

CLINICAL DATA: Cough with concern for aspiration

EXAM:
PORTABLE CHEST 1 VIEW

[chest ap]
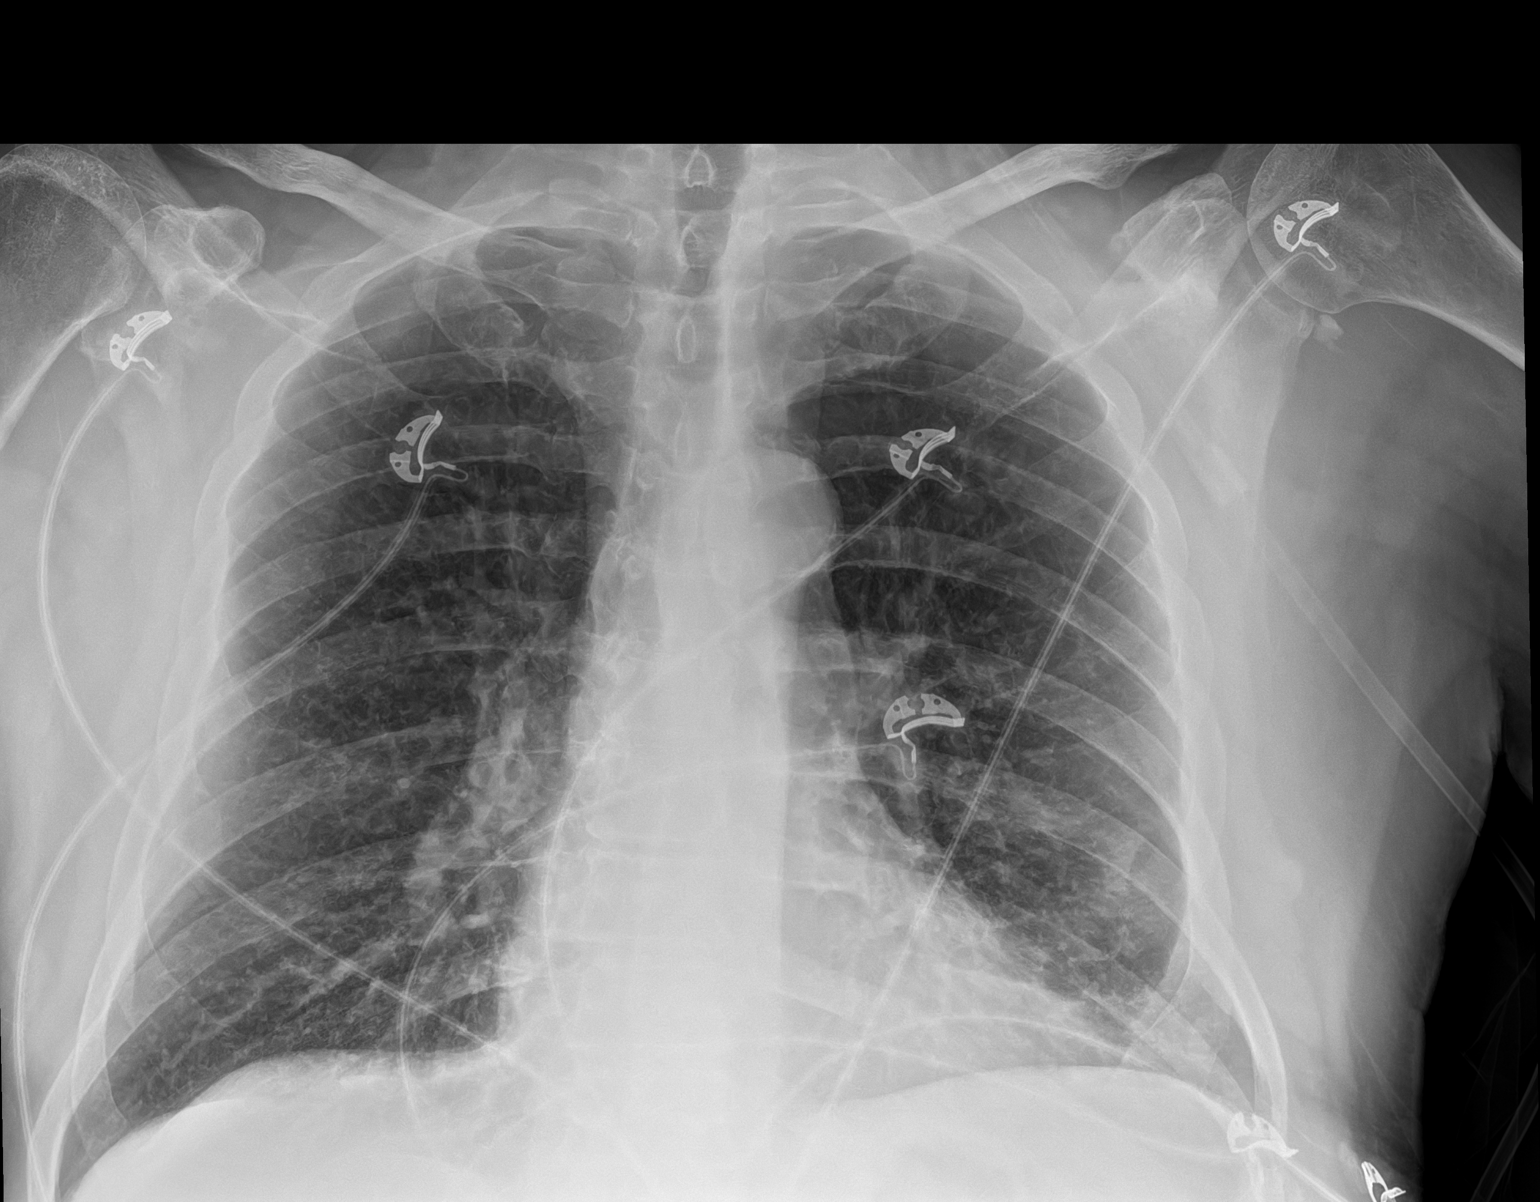

[1 of 1 positions shown; findings below may reference images not displayed]

FINDINGS: No edema or airspace opacity. Interstitial mildly thickened
bilaterally. Heart size and pulmonary vascularity are normal. No
adenopathy. There is aortic atherosclerosis. Arthropathy noted in
left shoulder.
IMPRESSION: Interstitial thickening may represent a degree of underlying chronic
bronchitis. No edema or airspace opacity. Heart size normal. Aortic
Atherosclerosis ([NC]-[NC]).

## 2020-08-12 NOTE — ED Provider Notes (Signed)
[facility] EMERGENCY DEPARTMENT Provider Note   CSN: [id number] Arrival date & time: 08/12/20  1320     History Chief Complaint  Patient presents with   Cough    Potential Aspiration    Brent [name] is a 55 y.o. male.  Pt reports he was eating and choked.  Pt reports he feels fine now.  Pt had a stroke on 6/9.    The history is provided by the patient. No language interpreter was used.  Cough Cough characteristics:  Non-productive Sputum characteristics:  Nondescript Onset quality:  Sudden Relieved by:  Nothing Worsened by:  Nothing Ineffective treatments:  None tried     Past Medical History:  Diagnosis Date   Aplastic anemia, unspecified (HCC)    Chest pain, unspecified    Chronic airway obstruction, not elsewhere classified    Cocaine substance abuse (HCC)    Neuropathy    Other and unspecified hyperlipidemia    Shortness of breath    Stroke (HCC) 06/17/2020   Tobacco use disorder    Type II or unspecified type diabetes mellitus without mention of complication, not stated as uncontrolled    Unspecified epilepsy without mention of intractable epilepsy    Unspecified essential hypertension     Patient Active Problem List   Diagnosis Date Noted   Cocaine abuse (HCC)    Acute CVA (cerebrovascular accident) (HCC) 07/25/2020   Acute on chronic respiratory failure with hypoxia (HCC) 11/04/2019   Rhinovirus infection 11/04/2019   Essential hypertension 11/04/2019   CAP (community acquired pneumonia) 11/03/2019   Diabetic polyneuropathy associated with diabetes mellitus due to underlying condition (HCC) 02/06/2019   Localization-related (focal) (partial) symptomatic epilepsy and epileptic syndromes with complex partial seizures, intractable, without status epilepticus (HCC) 02/06/2019   Hyperlipidemia associated with type 2 diabetes mellitus (HCC) 11/26/2018   Cough 11/03/2018   Obstructive sleep apnea 11/03/2018   Dysphagia-----s/p Prior Uvulectomy and Now  with Acute CVA 09/27/2018   Eczema 09/27/2018   Right leg weakness 01/19/2018   History of arthroscopy of knee 11/15/2017   Complex tear of lateral meniscus of right knee as current injury 10/27/2017   Effusion of right knee 07/26/2017   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   Cellulitis 07/22/2017   Primary osteoarthritis involving multiple joints 06/22/2017   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 02/01/2017   Right knee pain 02/01/2017   Acute bronchitis 12/07/2016   Acute idiopathic gout of left foot 12/07/2016   Anxiety, generalized 12/07/2016   COPD with acute exacerbation (HCC) 12/07/2016   Seizures (HCC) 12/07/2016   CAD (coronary artery disease) 12/19/2010   DM II (diabetes mellitus, type II), controlled (HCC) 12/19/2010   Hypertension associated with diabetes (HCC) 12/19/2010   GERD (gastroesophageal reflux disease) 12/19/2010   Hypertriglyceridemia 12/19/2010   Tobacco abuse 12/19/2010    Past Surgical History:  Procedure Laterality Date   CARDIAC CATHETERIZATION  10/02/2010    Nonobstructive mild coronary plaque   KNEE SURGERY Bilateral 2018   Repair   PERFORATED VISCUS SURGERY     MULTIPLE FRACTURES       Family History  Problem Relation Age of Onset   Heart attack Father    COPD Father    Coronary artery disease Mother     Social History   Tobacco Use   Smoking status: Every Day    Packs/day: 1.00    Years: 30.00    Pack years: 30.00    Types: Cigarettes   Smokeless tobacco:  Never  Substance Use Topics   Alcohol use: Yes    Comment: drinks beer on the weekends   Drug use: Never    Home Medications Prior to Admission medications   Medication Sig Start Date End Date Taking? Authorizing Provider  albuterol (PROVENTIL) (2.5 MG/3ML) 0.083% nebulizer solution Take 2.5 mg by nebulization every 4 (four) hours as needed for wheezing or shortness of breath.   Yes [provider]  albuterol (VENTOLIN HFA) 108  (90 Base) MCG/ACT inhaler Inhale 2 puffs into the lungs every 6 (six) hours as needed for wheezing or shortness of breath.   Yes [provider]  alclomethasone (ACLOVATE) 0.05 % cream Apply 1 application topically 2 (two) times daily.   Yes [provider]  aspirin EC 81 MG tablet Take 81 mg by mouth daily. Swallow whole.   Yes [provider]  benazepril (LOTENSIN) 10 MG tablet Take 20 mg by mouth daily.   Yes [provider]  budesonide-formoterol (SYMBICORT) 160-4.5 MCG/ACT inhaler Inhale 2 puffs into the lungs in the morning and at bedtime.  06/29/18  Yes [provider]  carbamazepine (TEGRETOL XR) 400 MG 12 hr tablet Take 1 tablet (400 mg total) by mouth 2 (two) times daily. 06/05/20  Yes Van Clines, MD  clopidogrel (PLAVIX) 75 MG tablet Take 75 mg by mouth daily. 06/20/20  Yes [provider]  dextromethorphan-guaiFENesin (MUCINEX DM) 30-600 MG 12hr tablet Take 1 tablet by mouth 2 (two) times daily. 11/08/19  Yes Swayze, Ava, DO  escitalopram (LEXAPRO) 10 MG tablet Take 10 mg by mouth daily.  06/01/18  Yes [provider]  fenofibrate 160 MG tablet Take 48 mg by mouth daily. 12/11/16  Yes [provider]  food thickener (SIMPLYTHICK, HONEY/LEVEL 3/MODERATELY THICK,) LIQD Take 1 packet by mouth as needed. 07/29/20 11/26/20 Yes Vassie Loll, MD  gabapentin (NEURONTIN) 300 MG capsule Take 3 caps in AM, 3 caps at noon, 4 caps at bedtime 06/05/20  Yes Van Clines, MD  insulin glargine (LANTUS) 100 UNIT/ML injection Inject 70 Units into the skin daily.   Yes [provider]  JANUMET 50-1000 MG tablet Take 1 tablet by mouth 2 (two) times daily. 07/10/20  Yes [provider]  metoprolol tartrate (LOPRESSOR) 50 MG tablet Take 50 mg by mouth 2 (two) times daily. 05/29/20  Yes [provider]  Omega-3 Fatty Acids (FISH OIL) 1000 MG CAPS Take 500 mg by mouth 2 (two) times daily.   Yes [provider]  omeprazole (PRILOSEC) 40 MG capsule Take 1 capsule (40 mg total) by mouth 2 (two) times daily. 03/21/19  Yes Tawni Pummel B, PA-C  rosuvastatin (CRESTOR) 20 MG tablet Take 1 tablet (20 mg total) by mouth daily. 07/30/20  Yes Vassie Loll, MD    Allergies    Patient has no known allergies.  Review of Systems   Review of Systems  Respiratory:  Positive for cough.   All other systems reviewed and are negative.  Physical Exam Updated Vital Signs BP (!) 158/88   Pulse 78   Temp 98.1 F (36.7 C) (Oral)   Resp 20   Ht 6' (1.829 m)   Wt 85.7 kg   SpO2 95%   BMI 25.63 kg/m   Physical Exam Vitals reviewed.  Constitutional:      Appearance: Normal appearance.  HENT:     Nose: Nose normal.     Mouth/Throat:     Mouth: Mucous membranes are moist.  Cardiovascular:  Rate and Rhythm: Normal rate.  Pulmonary:     Effort: Pulmonary effort is normal.     Breath sounds: Normal breath sounds.  Musculoskeletal:        General: Swelling present.     Cervical back: Normal range of motion.  Skin:    General: Skin is warm.  Neurological:     General: No focal deficit present.     Mental Status: He is alert.  Psychiatric:        Mood and Affect: Mood normal.    ED Results / Procedures / Treatments   Labs (all labs ordered are listed, but only abnormal results are displayed) Labs Reviewed  CBC WITH DIFFERENTIAL/PLATELET - Abnormal; Notable for the following components:      Result Value   RBC 4.18 (*)    All other components within normal limits  COMPREHENSIVE METABOLIC PANEL - Abnormal; Notable for the following components:   Glucose, Bld 202 (*)    AST 12 (*)    All other components within normal limits    EKG None  Radiology DG Chest Port 1 View  Result Date: 08/12/2020 CLINICAL DATA:  Cough with concern for aspiration EXAM: PORTABLE CHEST 1 VIEW COMPARISON:  Jun 27, 2020 FINDINGS: No edema or airspace opacity. Interstitial mildly thickened bilaterally. Heart  size and pulmonary vascularity are normal. No adenopathy. There is aortic atherosclerosis. Arthropathy noted in left shoulder. IMPRESSION: Interstitial thickening may represent a degree of underlying chronic bronchitis. No edema or airspace opacity. Heart size normal. Aortic Atherosclerosis (ICD10-I70.0). Electronically Signed   By: Bretta Bang III M.D.   On: 08/12/2020 14:22    Procedures Procedures   Medications Ordered in ED Medications - No data to display  ED Course  I have reviewed the triage vital signs and the nursing notes.  Pertinent labs & imaging results that were available during my care of the patient were reviewed by me and considered in my medical decision making (see chart for details).    MDM Rules/Calculators/A&P                          Chest xray no acute.  Pt observed, no cough, no shortness of breath.  Pt reports feeling well enough to go home  Final Clinical Impression(s) / ED Diagnoses Final diagnoses:  Cough  Choking episode    Rx / DC Orders ED Discharge Orders     None        Elson Areas, [place] 08/12/20 1726    Sabas Sous, MD 08/14/20 [phone]

## 2020-08-12 NOTE — ED Triage Notes (Signed)
Pt has history of stroke, wife and care taker concerned he has aspirated.  Pt has no complaints, alert and oriented.

## 2020-08-12 NOTE — ED Notes (Signed)
Brent [name] notified pt is ready to come home.

## 2020-09-09 ENCOUNTER — Ambulatory Visit: Payer: Medicaid Other | Admitting: Neurology

## 2020-09-11 ENCOUNTER — Emergency Department (HOSPITAL_COMMUNITY): Payer: Medicaid Other

## 2020-09-11 ENCOUNTER — Inpatient Hospital Stay (HOSPITAL_COMMUNITY)
Admission: EM | Admit: 2020-09-11 | Discharge: 2020-09-27 | DRG: 871 | Disposition: A | Discharge status: Rehabilitation Facility | Payer: Medicaid Other | Attending: Internal Medicine | Admitting: Internal Medicine

## 2020-09-11 ENCOUNTER — Inpatient Hospital Stay (HOSPITAL_COMMUNITY): Payer: Medicaid Other

## 2020-09-11 ENCOUNTER — Encounter (HOSPITAL_COMMUNITY): Payer: Self-pay

## 2020-09-11 ENCOUNTER — Other Ambulatory Visit: Payer: Self-pay

## 2020-09-11 DIAGNOSIS — Z7189 Other specified counseling: Secondary | ICD-10-CM | POA: Diagnosis not present

## 2020-09-11 DIAGNOSIS — T17928A Food in respiratory tract, part unspecified causing other injury, initial encounter: Secondary | ICD-10-CM | POA: Diagnosis present

## 2020-09-11 DIAGNOSIS — I639 Cerebral infarction, unspecified: Secondary | ICD-10-CM | POA: Diagnosis not present

## 2020-09-11 DIAGNOSIS — F17210 Nicotine dependence, cigarettes, uncomplicated: Secondary | ICD-10-CM | POA: Diagnosis present

## 2020-09-11 DIAGNOSIS — Z931 Gastrostomy status: Secondary | ICD-10-CM | POA: Diagnosis not present

## 2020-09-11 DIAGNOSIS — Z515 Encounter for palliative care: Secondary | ICD-10-CM | POA: Diagnosis not present

## 2020-09-11 DIAGNOSIS — E1142 Type 2 diabetes mellitus with diabetic polyneuropathy: Secondary | ICD-10-CM | POA: Diagnosis present

## 2020-09-11 DIAGNOSIS — T17500A Unspecified foreign body in bronchus causing asphyxiation, initial encounter: Secondary | ICD-10-CM | POA: Diagnosis not present

## 2020-09-11 DIAGNOSIS — Z7902 Long term (current) use of antithrombotics/antiplatelets: Secondary | ICD-10-CM

## 2020-09-11 DIAGNOSIS — R29810 Facial weakness: Secondary | ICD-10-CM | POA: Diagnosis present

## 2020-09-11 DIAGNOSIS — Z9119 Patient's noncompliance with other medical treatment and regimen: Secondary | ICD-10-CM

## 2020-09-11 DIAGNOSIS — R5381 Other malaise: Secondary | ICD-10-CM | POA: Diagnosis not present

## 2020-09-11 DIAGNOSIS — R531 Weakness: Secondary | ICD-10-CM | POA: Diagnosis not present

## 2020-09-11 DIAGNOSIS — J9621 Acute and chronic respiratory failure with hypoxia: Secondary | ICD-10-CM | POA: Diagnosis present

## 2020-09-11 DIAGNOSIS — G8929 Other chronic pain: Secondary | ICD-10-CM | POA: Diagnosis present

## 2020-09-11 DIAGNOSIS — K9421 Gastrostomy hemorrhage: Secondary | ICD-10-CM | POA: Diagnosis not present

## 2020-09-11 DIAGNOSIS — I10 Essential (primary) hypertension: Secondary | ICD-10-CM | POA: Diagnosis present

## 2020-09-11 DIAGNOSIS — J9819 Other pulmonary collapse: Secondary | ICD-10-CM | POA: Diagnosis not present

## 2020-09-11 DIAGNOSIS — N179 Acute kidney failure, unspecified: Secondary | ICD-10-CM | POA: Diagnosis not present

## 2020-09-11 DIAGNOSIS — F322 Major depressive disorder, single episode, severe without psychotic features: Secondary | ICD-10-CM | POA: Diagnosis not present

## 2020-09-11 DIAGNOSIS — G40909 Epilepsy, unspecified, not intractable, without status epilepticus: Secondary | ICD-10-CM | POA: Diagnosis present

## 2020-09-11 DIAGNOSIS — F419 Anxiety disorder, unspecified: Secondary | ICD-10-CM | POA: Diagnosis present

## 2020-09-11 DIAGNOSIS — J9601 Acute respiratory failure with hypoxia: Secondary | ICD-10-CM | POA: Diagnosis present

## 2020-09-11 DIAGNOSIS — G4733 Obstructive sleep apnea (adult) (pediatric): Secondary | ICD-10-CM | POA: Diagnosis present

## 2020-09-11 DIAGNOSIS — R1310 Dysphagia, unspecified: Secondary | ICD-10-CM | POA: Diagnosis not present

## 2020-09-11 DIAGNOSIS — I69322 Dysarthria following cerebral infarction: Secondary | ICD-10-CM | POA: Diagnosis not present

## 2020-09-11 DIAGNOSIS — A419 Sepsis, unspecified organism: Secondary | ICD-10-CM | POA: Diagnosis present

## 2020-09-11 DIAGNOSIS — I69391 Dysphagia following cerebral infarction: Secondary | ICD-10-CM | POA: Diagnosis not present

## 2020-09-11 DIAGNOSIS — K219 Gastro-esophageal reflux disease without esophagitis: Secondary | ICD-10-CM | POA: Diagnosis present

## 2020-09-11 DIAGNOSIS — E1165 Type 2 diabetes mellitus with hyperglycemia: Secondary | ICD-10-CM | POA: Diagnosis not present

## 2020-09-11 DIAGNOSIS — D649 Anemia, unspecified: Secondary | ICD-10-CM | POA: Diagnosis not present

## 2020-09-11 DIAGNOSIS — Z66 Do not resuscitate: Secondary | ICD-10-CM | POA: Diagnosis not present

## 2020-09-11 DIAGNOSIS — G4700 Insomnia, unspecified: Secondary | ICD-10-CM | POA: Diagnosis not present

## 2020-09-11 DIAGNOSIS — E119 Type 2 diabetes mellitus without complications: Secondary | ICD-10-CM | POA: Diagnosis not present

## 2020-09-11 DIAGNOSIS — R0603 Acute respiratory distress: Secondary | ICD-10-CM

## 2020-09-11 DIAGNOSIS — R471 Dysarthria and anarthria: Secondary | ICD-10-CM

## 2020-09-11 DIAGNOSIS — R9389 Abnormal findings on diagnostic imaging of other specified body structures: Secondary | ICD-10-CM | POA: Diagnosis not present

## 2020-09-11 DIAGNOSIS — R4587 Impulsiveness: Secondary | ICD-10-CM | POA: Diagnosis present

## 2020-09-11 DIAGNOSIS — J690 Pneumonitis due to inhalation of food and vomit: Secondary | ICD-10-CM

## 2020-09-11 DIAGNOSIS — Z825 Family history of asthma and other chronic lower respiratory diseases: Secondary | ICD-10-CM

## 2020-09-11 DIAGNOSIS — I63321 Cerebral infarction due to thrombosis of right anterior cerebral artery: Secondary | ICD-10-CM | POA: Diagnosis not present

## 2020-09-11 DIAGNOSIS — K922 Gastrointestinal hemorrhage, unspecified: Secondary | ICD-10-CM | POA: Diagnosis not present

## 2020-09-11 DIAGNOSIS — F1410 Cocaine abuse, uncomplicated: Secondary | ICD-10-CM | POA: Diagnosis present

## 2020-09-11 DIAGNOSIS — D619 Aplastic anemia, unspecified: Secondary | ICD-10-CM | POA: Diagnosis present

## 2020-09-11 DIAGNOSIS — R069 Unspecified abnormalities of breathing: Secondary | ICD-10-CM

## 2020-09-11 DIAGNOSIS — Z794 Long term (current) use of insulin: Secondary | ICD-10-CM

## 2020-09-11 DIAGNOSIS — D72829 Elevated white blood cell count, unspecified: Secondary | ICD-10-CM | POA: Diagnosis not present

## 2020-09-11 DIAGNOSIS — I69354 Hemiplegia and hemiparesis following cerebral infarction affecting left non-dominant side: Secondary | ICD-10-CM | POA: Diagnosis not present

## 2020-09-11 DIAGNOSIS — I6340 Cerebral infarction due to embolism of unspecified cerebral artery: Secondary | ICD-10-CM | POA: Diagnosis present

## 2020-09-11 DIAGNOSIS — F4323 Adjustment disorder with mixed anxiety and depressed mood: Secondary | ICD-10-CM | POA: Diagnosis not present

## 2020-09-11 DIAGNOSIS — Z9981 Dependence on supplemental oxygen: Secondary | ICD-10-CM

## 2020-09-11 DIAGNOSIS — T17998A Other foreign object in respiratory tract, part unspecified causing other injury, initial encounter: Secondary | ICD-10-CM | POA: Diagnosis not present

## 2020-09-11 DIAGNOSIS — E11649 Type 2 diabetes mellitus with hypoglycemia without coma: Secondary | ICD-10-CM | POA: Diagnosis not present

## 2020-09-11 DIAGNOSIS — J441 Chronic obstructive pulmonary disease with (acute) exacerbation: Secondary | ICD-10-CM | POA: Diagnosis present

## 2020-09-11 DIAGNOSIS — E870 Hyperosmolality and hypernatremia: Secondary | ICD-10-CM | POA: Diagnosis not present

## 2020-09-11 DIAGNOSIS — Z4659 Encounter for fitting and adjustment of other gastrointestinal appliance and device: Secondary | ICD-10-CM

## 2020-09-11 DIAGNOSIS — K5900 Constipation, unspecified: Secondary | ICD-10-CM | POA: Diagnosis not present

## 2020-09-11 DIAGNOSIS — E440 Moderate protein-calorie malnutrition: Secondary | ICD-10-CM | POA: Diagnosis not present

## 2020-09-11 DIAGNOSIS — Z8249 Family history of ischemic heart disease and other diseases of the circulatory system: Secondary | ICD-10-CM

## 2020-09-11 DIAGNOSIS — J189 Pneumonia, unspecified organism: Secondary | ICD-10-CM

## 2020-09-11 DIAGNOSIS — R1313 Dysphagia, pharyngeal phase: Secondary | ICD-10-CM | POA: Diagnosis present

## 2020-09-11 DIAGNOSIS — Z720 Tobacco use: Secondary | ICD-10-CM | POA: Diagnosis present

## 2020-09-11 DIAGNOSIS — R29711 NIHSS score 11: Secondary | ICD-10-CM | POA: Diagnosis present

## 2020-09-11 DIAGNOSIS — E785 Hyperlipidemia, unspecified: Secondary | ICD-10-CM | POA: Diagnosis present

## 2020-09-11 DIAGNOSIS — D62 Acute posthemorrhagic anemia: Secondary | ICD-10-CM | POA: Diagnosis not present

## 2020-09-11 DIAGNOSIS — R1312 Dysphagia, oropharyngeal phase: Secondary | ICD-10-CM | POA: Diagnosis not present

## 2020-09-11 DIAGNOSIS — Z20822 Contact with and (suspected) exposure to covid-19: Secondary | ICD-10-CM | POA: Diagnosis not present

## 2020-09-11 DIAGNOSIS — Z7951 Long term (current) use of inhaled steroids: Secondary | ICD-10-CM

## 2020-09-11 DIAGNOSIS — I69351 Hemiplegia and hemiparesis following cerebral infarction affecting right dominant side: Secondary | ICD-10-CM | POA: Diagnosis not present

## 2020-09-11 DIAGNOSIS — J9811 Atelectasis: Secondary | ICD-10-CM | POA: Diagnosis not present

## 2020-09-11 DIAGNOSIS — D72823 Leukemoid reaction: Secondary | ICD-10-CM | POA: Diagnosis not present

## 2020-09-11 DIAGNOSIS — R7309 Other abnormal glucose: Secondary | ICD-10-CM | POA: Diagnosis not present

## 2020-09-11 DIAGNOSIS — I6389 Other cerebral infarction: Secondary | ICD-10-CM | POA: Diagnosis not present

## 2020-09-11 DIAGNOSIS — K9423 Gastrostomy malfunction: Secondary | ICD-10-CM

## 2020-09-11 DIAGNOSIS — R000 Tachycardia, unspecified: Secondary | ICD-10-CM | POA: Diagnosis present

## 2020-09-11 DIAGNOSIS — M549 Dorsalgia, unspecified: Secondary | ICD-10-CM | POA: Diagnosis present

## 2020-09-11 DIAGNOSIS — E782 Mixed hyperlipidemia: Secondary | ICD-10-CM | POA: Diagnosis not present

## 2020-09-11 DIAGNOSIS — R0902 Hypoxemia: Secondary | ICD-10-CM

## 2020-09-11 DIAGNOSIS — E781 Pure hyperglyceridemia: Secondary | ICD-10-CM | POA: Diagnosis present

## 2020-09-11 DIAGNOSIS — Z7982 Long term (current) use of aspirin: Secondary | ICD-10-CM

## 2020-09-11 DIAGNOSIS — E1169 Type 2 diabetes mellitus with other specified complication: Secondary | ICD-10-CM | POA: Diagnosis not present

## 2020-09-11 DIAGNOSIS — Z79899 Other long term (current) drug therapy: Secondary | ICD-10-CM

## 2020-09-11 LAB — PROTIME-INR
INR: 0.9 (ref 0.8–1.2)
Prothrombin Time: 12.3 seconds (ref 11.4–15.2)

## 2020-09-11 LAB — RAPID URINE DRUG SCREEN, HOSP PERFORMED
Amphetamines: NOT DETECTED
Barbiturates: NOT DETECTED
Benzodiazepines: NOT DETECTED
Cocaine: POSITIVE — AB
Opiates: NOT DETECTED
Tetrahydrocannabinol: NOT DETECTED

## 2020-09-11 LAB — URINALYSIS, ROUTINE W REFLEX MICROSCOPIC
Bacteria, UA: NONE SEEN
Bilirubin Urine: NEGATIVE
Glucose, UA: NEGATIVE mg/dL
Hgb urine dipstick: NEGATIVE
Ketones, ur: NEGATIVE mg/dL
Leukocytes,Ua: NEGATIVE
Nitrite: NEGATIVE
Protein, ur: 30 mg/dL — AB
Specific Gravity, Urine: >1.046 — ABNORMAL HIGH (ref 1.005–1.030)
pH: 5 (ref 5.0–8.0)

## 2020-09-11 LAB — DIFFERENTIAL
Abs Immature Granulocytes: 0.07 10*3/uL (ref 0.00–0.07)
Basophils Absolute: 0.1 10*3/uL (ref 0.0–0.1)
Basophils Relative: 0 %
Eosinophils Absolute: 0.3 10*3/uL (ref 0.0–0.5)
Eosinophils Relative: 2 %
Immature Granulocytes: 1 %
Lymphocytes Relative: 11 %
Lymphs Abs: 1.7 10*3/uL (ref 0.7–4.0)
Monocytes Absolute: 0.9 10*3/uL (ref 0.1–1.0)
Monocytes Relative: 6 %
Neutro Abs: 12.3 10*3/uL — ABNORMAL HIGH (ref 1.7–7.7)
Neutrophils Relative %: 80 %

## 2020-09-11 LAB — COMPREHENSIVE METABOLIC PANEL
ALT: 11 U/L (ref 0–44)
AST: 12 U/L — ABNORMAL LOW (ref 15–41)
Albumin: 3.7 g/dL (ref 3.5–5.0)
Alkaline Phosphatase: 70 U/L (ref 38–126)
Anion gap: 7 (ref 5–15)
BUN: 16 mg/dL (ref 6–20)
CO2: 29 mmol/L (ref 22–32)
Calcium: 9.3 mg/dL (ref 8.9–10.3)
Chloride: 104 mmol/L (ref 98–111)
Creatinine, Ser: 0.87 mg/dL (ref 0.61–1.24)
GFR, Estimated: >60 mL/min (ref >60)
Glucose, Bld: 135 mg/dL — ABNORMAL HIGH (ref 70–99)
Potassium: 3.8 mmol/L (ref 3.5–5.1)
Sodium: 140 mmol/L (ref 135–145)
Total Bilirubin: 0.3 mg/dL (ref 0.3–1.2)
Total Protein: 7 g/dL (ref 6.5–8.1)

## 2020-09-11 LAB — RESP PANEL BY RT-PCR (FLU A&B, COVID) ARPGX2
Influenza A by PCR: NEGATIVE
Influenza B by PCR: NEGATIVE
SARS Coronavirus 2 by RT PCR: NEGATIVE

## 2020-09-11 LAB — BLOOD GAS, ARTERIAL
Acid-Base Excess: 5.1 mmol/L — ABNORMAL HIGH (ref 0.0–2.0)
Bicarbonate: 28.4 mmol/L — ABNORMAL HIGH (ref 20.0–28.0)
FIO2: 80
O2 Saturation: 87.5 %
Patient temperature: 36.7
pCO2 arterial: 44.1 mmHg (ref 32.0–48.0)
pH, Arterial: 7.437 (ref 7.350–7.450)
pO2, Arterial: 52.7 mmHg — ABNORMAL LOW (ref 83.0–108.0)

## 2020-09-11 LAB — LACTIC ACID, PLASMA: Lactic Acid, Venous: 1.2 mmol/L (ref 0.5–1.9)

## 2020-09-11 LAB — APTT: aPTT: 31 seconds (ref 24–36)

## 2020-09-11 LAB — CBG MONITORING, ED: Glucose-Capillary: 119 mg/dL — ABNORMAL HIGH (ref 70–99)

## 2020-09-11 LAB — ETHANOL: Alcohol, Ethyl (B): <10 mg/dL (ref <10)

## 2020-09-11 LAB — CBC
HCT: 40 % (ref 39.0–52.0)
Hemoglobin: 13.2 g/dL (ref 13.0–17.0)
MCH: 32.5 pg (ref 26.0–34.0)
MCHC: 33 g/dL (ref 30.0–36.0)
MCV: 98.5 fL (ref 80.0–100.0)
Platelets: 276 10*3/uL (ref 150–400)
RBC: 4.06 MIL/uL — ABNORMAL LOW (ref 4.22–5.81)
RDW: 13.5 % (ref 11.5–15.5)
WBC: 15.3 10*3/uL — ABNORMAL HIGH (ref 4.0–10.5)
nRBC: 0 % (ref 0.0–0.2)

## 2020-09-11 LAB — MRSA NEXT GEN BY PCR, NASAL: MRSA by PCR Next Gen: NOT DETECTED

## 2020-09-11 LAB — PROCALCITONIN: Procalcitonin: <0.1 ng/mL

## 2020-09-11 IMAGING — DX DG CHEST 1V PORT
1 series · 1 of 1 positions shown · non-contrast
Comparison: Radiograph [DATE], chest CT [DATE]

CLINICAL DATA: Respiratory distress

EXAM:
PORTABLE CHEST 1 VIEW

[chest ap]
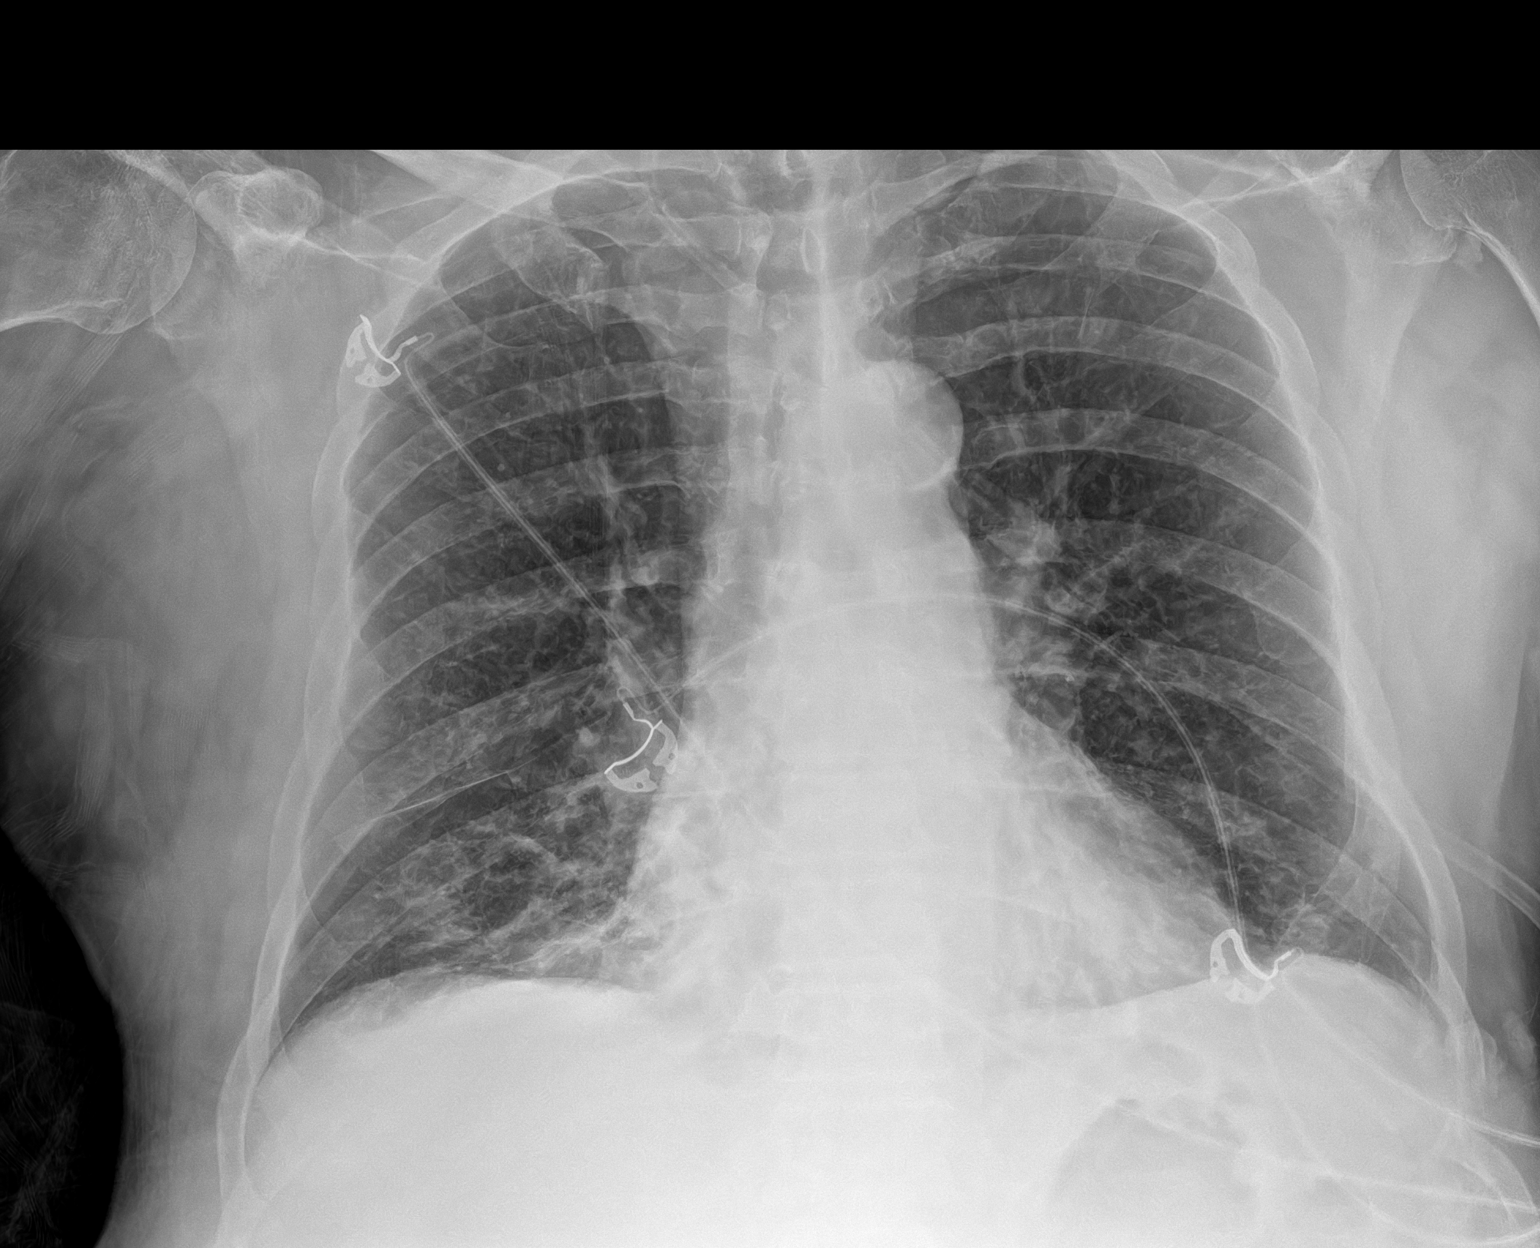

[1 of 1 positions shown; findings below may reference images not displayed]

FINDINGS: Unchanged cardiomediastinal silhouette. There are bibasilar airspace
opacities. There is no large pleural effusion or visible
pneumothorax. There is no acute osseous abnormality.
IMPRESSION: Bibasilar airspace opacities, which could represent
aspiration/pneumonia.

## 2020-09-11 IMAGING — DX DG CHEST 1V PORT
1 series · 2 of 2 positions shown · non-contrast
Comparison: [DATE].

CLINICAL DATA: Shortness of breath.

EXAM:
PORTABLE CHEST 1 VIEW

[Series 1: chest ap · 0.14mm/px · 2 of 2 slices shown]
[im 1/2]
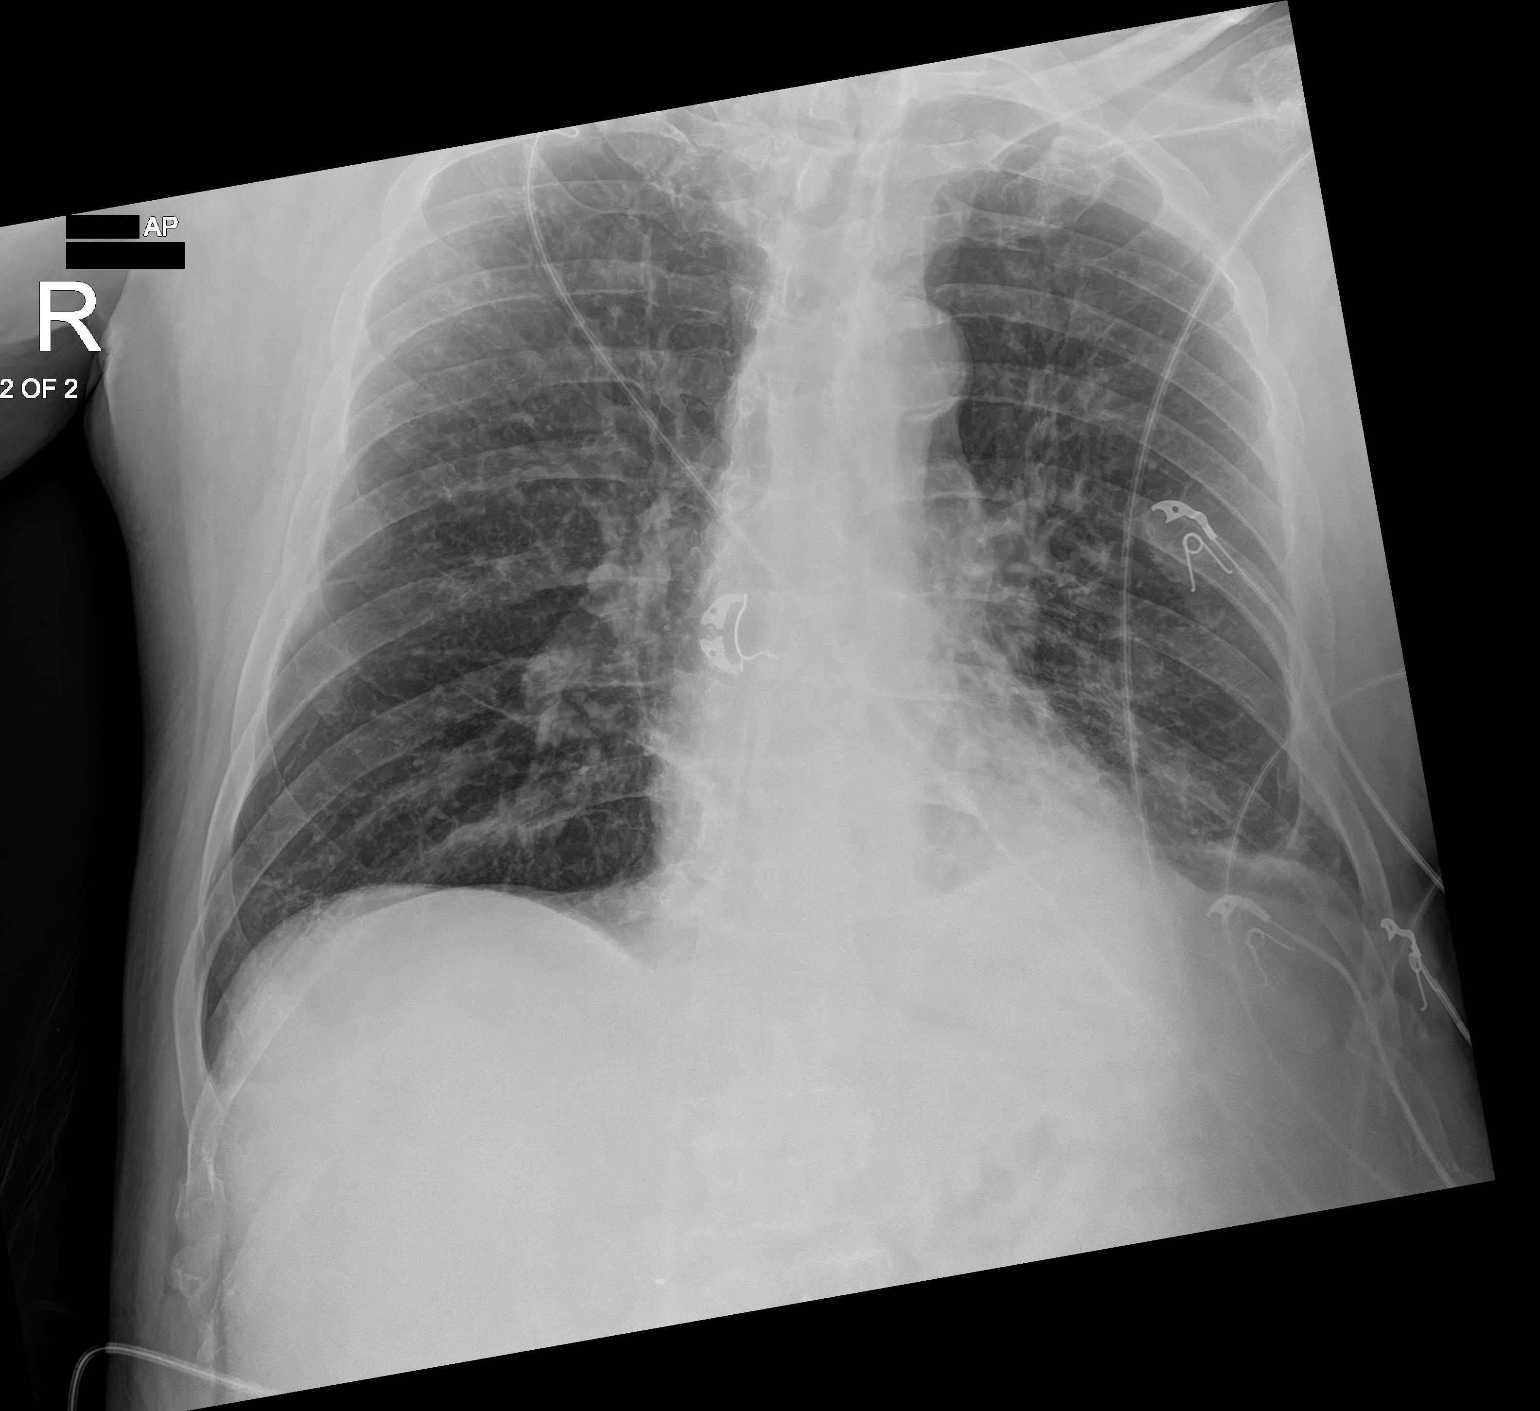
[im 2/2]
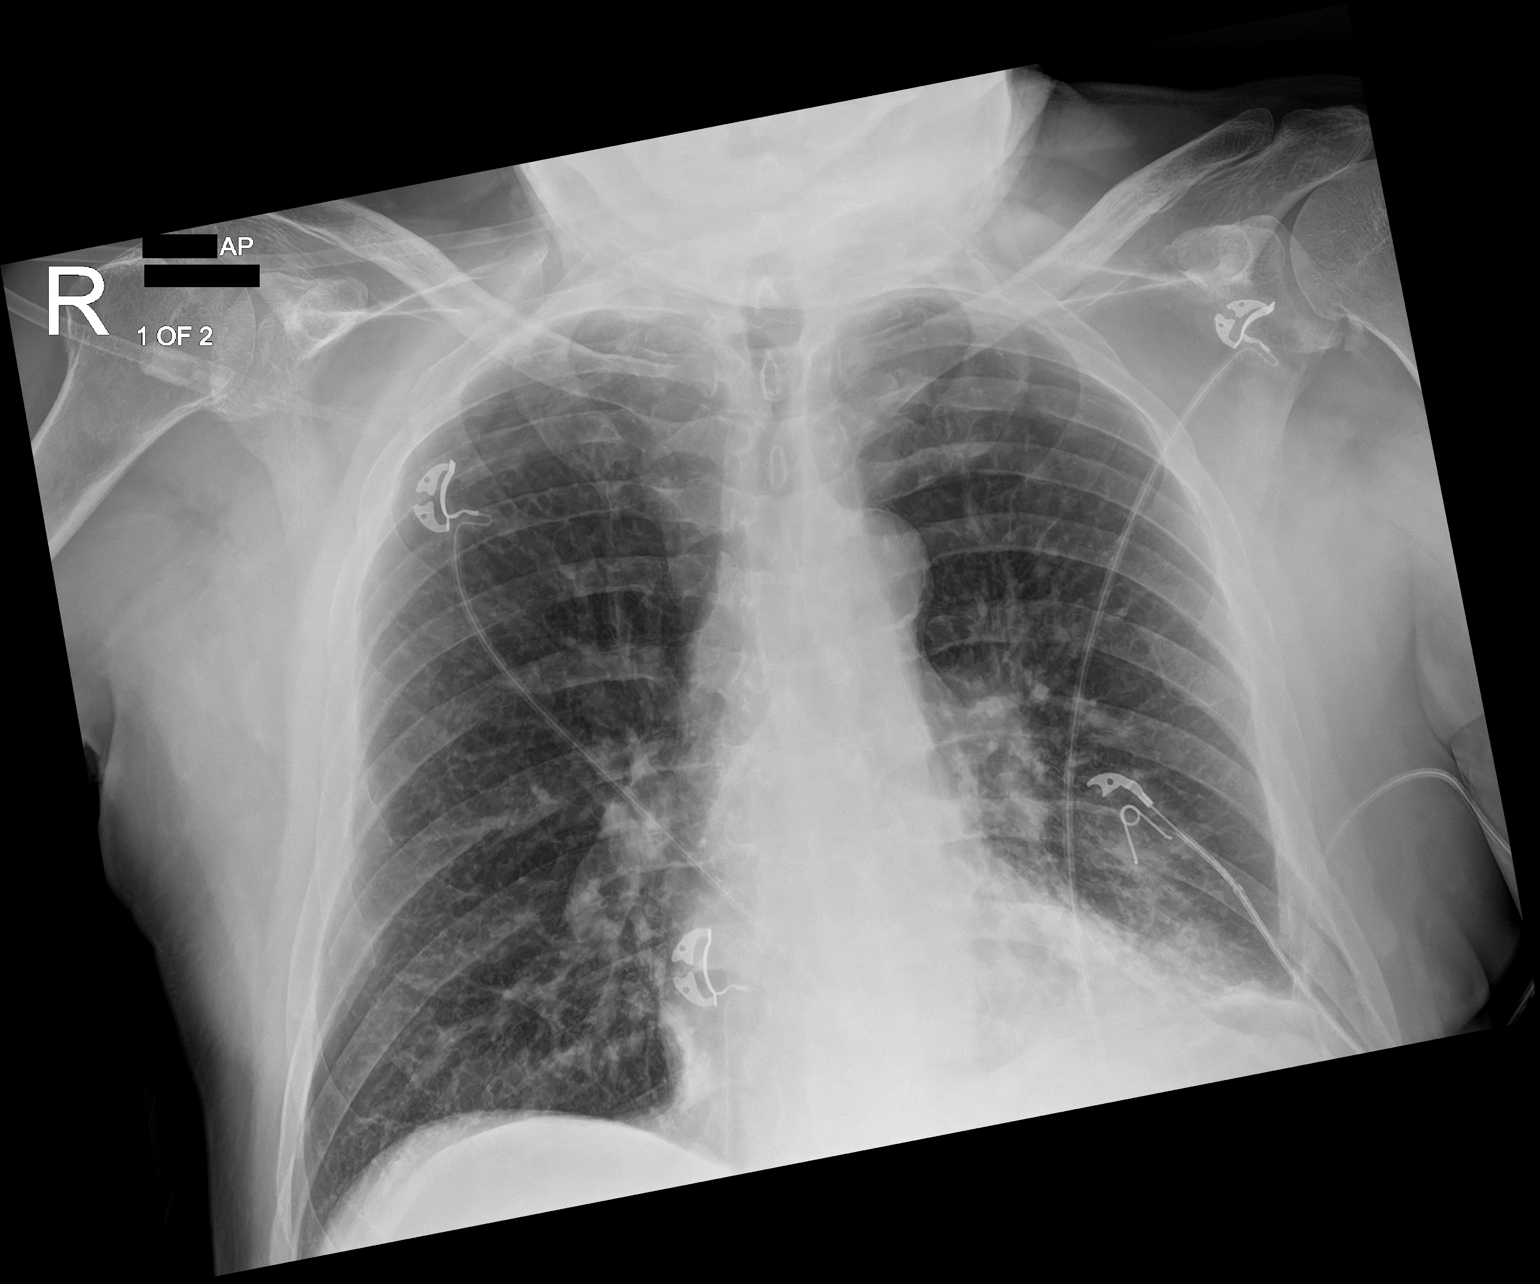

[2 of 2 positions shown; findings below may reference images not displayed]

FINDINGS: Mediastinum hilar structures normal. Heart size normal. Left lung
base infiltrate consistent with pneumonia. Bibasilar atelectasis. No
pleural effusion or pneumothorax. Degenerative change thoracic
spine.
IMPRESSION: Left lung base infiltrate consistent with pneumonia. Bibasilar
atelectasis.

## 2020-09-11 IMAGING — MR MR HEAD W/O CM
8 of 9 series · 28 of 48 positions shown · non-contrast
Comparison: Noncontrast head CT and CT angiogram head/neck
[DATE]. MRI brain and MRA head [DATE].

CLINICAL DATA: Neuro deficit, acute, stroke suspected. Additional
history provided by technologist: Altered mental status.

EXAM:
MRI HEAD WITHOUT CONTRAST
MRA HEAD WITHOUT CONTRAST
TECHNIQUE: Multiplanar, multi-echo pulse sequences of the brain and surrounding
structures were acquired without intravenous contrast. Angiographic
images of the Circle of Willis were acquired using MRA technique
without intravenous contrast.

[Series 5: DWI · axial · 4.0mm · 0.88mm/px · z∈[-73,+57]mm · 5 of 36 slices shown (1 of 6)]
[im 1/36]
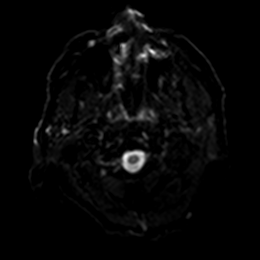
[im 9/36]
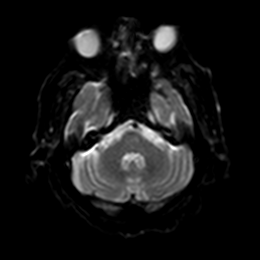
[im 18/36]
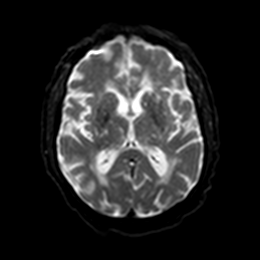
[im 27/36]
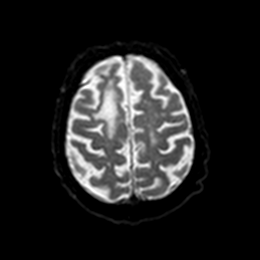
[im 36/36]
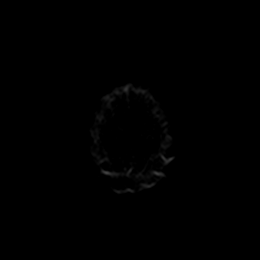

[Series 5: DWI · axial · 4.0mm · 0.88mm/px · z∈[-73,+57]mm · 5 of 36 slices shown (2 of 6)]
[im 1/36]
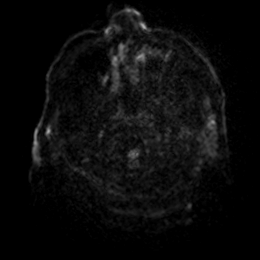
[im 9/36]
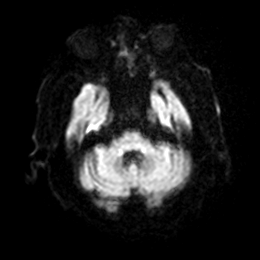
[im 18/36]
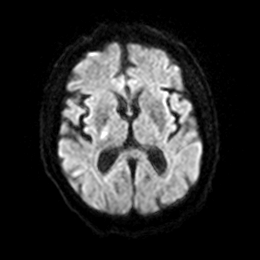
[im 27/36]
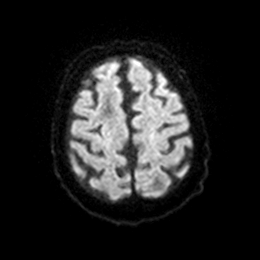
[im 36/36]
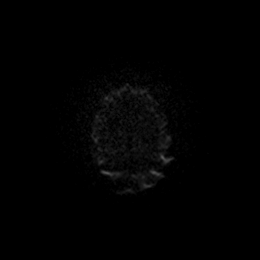

[Series 6: DWI · axial · 4.0mm · 0.88mm/px · z∈[-73,+57]mm · 4 of 36 slices shown (3 of 6)]
[im 1/36]
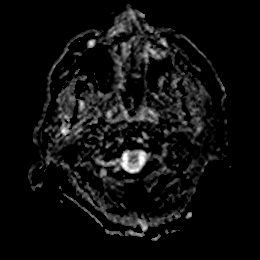
[im 12/36]
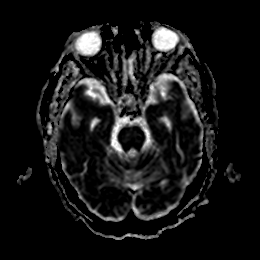
[im 24/36]
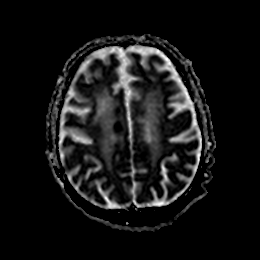
[im 36/36]
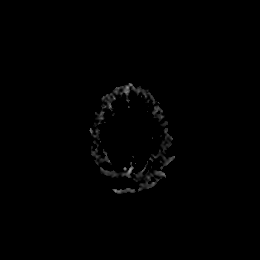

[Series 7: DWI · coronal · 5.0mm · 0.88mm/px · 3 of 28 slices shown (4 of 6)]
[im 1/28]
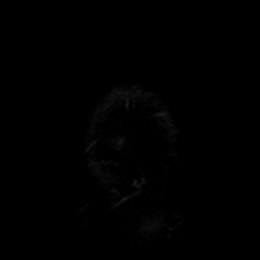
[im 14/28]
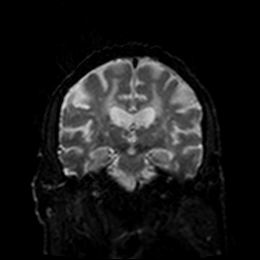
[im 28/28]
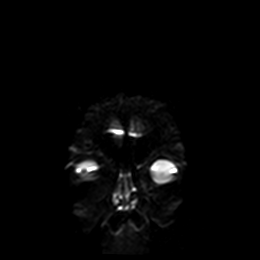

[Series 7: DWI · coronal · 5.0mm · 0.88mm/px · 3 of 28 slices shown (5 of 6)]
[im 1/28]
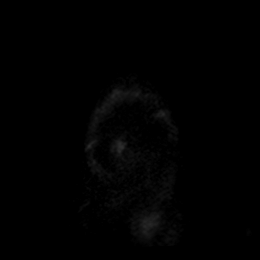
[im 14/28]
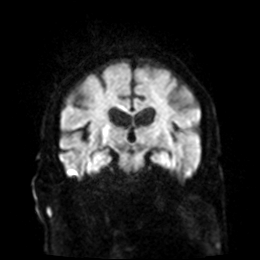
[im 28/28]
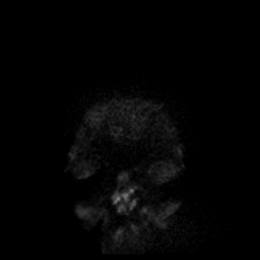

[Series 8: DWI · coronal · 5.0mm · 0.88mm/px · 3 of 28 slices shown (6 of 6)]
[im 1/28]
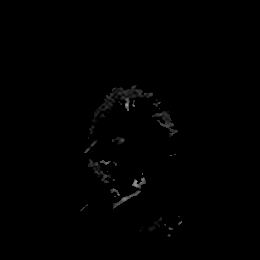
[im 14/28]
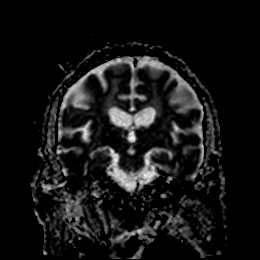
[im 28/28]
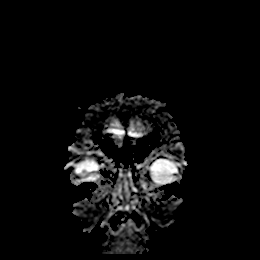

[Series 9: T1 · sagittal · 5.0mm · 0.94mm/px · 3 of 21 slices shown]
[im 1/21]
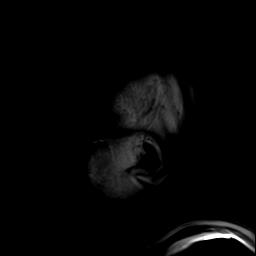
[im 11/21]
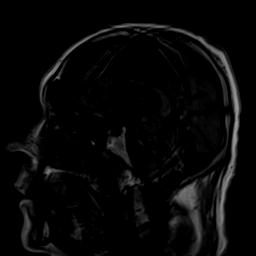
[im 21/21]
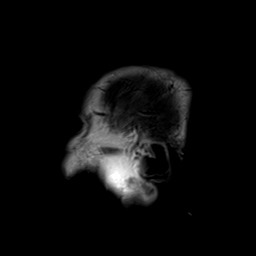

[Series 15: T2 · axial · 5.0mm · 0.72mm/px · z∈[-70,+54]mm · 2 of 20 slices shown]
[im 1/20]
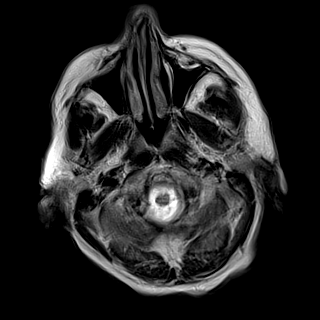
[im 20/20]
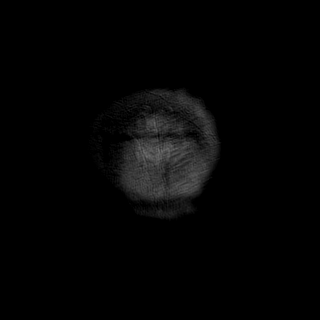

[28 of 48 positions shown; findings below may reference images not displayed]

FINDINGS: MRI HEAD FINDINGS

Brain:

The examination was prematurely terminated due to the patient's
altered mental status and excessive motion degradation. Only axial
and coronal diffusion-weighted imaging, a sagittal T1 weighted
sequence, and an axial T2 TSE sequence were obtained. The acquired
sequences are motion degraded. Most notably, there is severe motion
degradation of the sagittal T1 weighted sequence and axial T2 TSE
sequence.

Mild generalized parenchymal atrophy.

Two adjacent acute infarcts within the cingulate gyrus/callosal body
measuring up to 8 mm.

Restricted diffusion within the posterior limb of right internal
capsule, increased in extent as compared to the brain MRI of
[DATE]. This may reflect persistent restricted diffusion at site
of a subacute infarction, or an acute on subacute infarct.

6 mm acute infarct within the right parietal lobe subcortical white
matter (series 5, image 20).

Questionable punctate acute infarct within the left frontal lobe
subcortical white matter (series 5, image 21).

Known chronic cortical/subcortical infarcts within the high right
frontal lobe and left temporal occipital lobes.

Incompletely assessed severe chronic small vessel ischemic changes
within the cerebral white matter.

Known chronic right basal ganglia lacunar infarct.

Within described limitations, no intracranial mass or extra-axial
fluid collection is identified. No midline shift.

Vascular: Flow voids poorly assessed due to the degree of motion
degradation.

Skull and upper cervical spine: Within described limitations, no
focal suspicious marrow lesion is identified.

Sinuses/Orbits: No acute orbital abnormality is identified. Mild
mucosal thickening within the left maxillary sinus.

MRA HEAD FINDINGS

The examination is significantly motion degraded, precluding
adequate evaluation for intracranial arterial stenoses. No proximal
arterial occlusion is identified within the intracranial internal
carotid arteries, M1 middle cerebral arteries, proximal anterior
cerebral arteries, intracranial vertebral arteries, basilar artery
or proximal posterior cerebral arteries. The examination is the
nondiagnostic more distally. 2 mm inferiorly projecting vascular
protrusion arising from the cavernous left ICA, likely reflecting an
aneurysm (series [BX], image 18).
IMPRESSION: MRI brain:

1. Prematurely terminated and significantly motion degraded
examination, as described and limiting evaluation.
2. Two adjacent acute infarcts within the right cingulate
gyrus/callosal body (right ACA vascular territory).
3. Restricted diffusion within the posterior limb of right internal
capsule, increased in extent as compared to the brain MRI of
[DATE]. This may reflect persistent restricted diffusion at site
of a known subacute infarct, or an acute on subacute infarct.
4. 6 mm acute infarct within the right parietal lobe subcortical
white matter.
5. Questionable punctate acute infarct within the left frontal lobe
subcortical white matter.
6. Given infarcts involving multiple vascular territories, consider
an embolic process.
7. Known chronic cortical/subcortical infarcts within the high right
frontal lobe and left occipital temporal lobes.
8. Incompletely assessed severe chronic small vessel ischemic
changes within the cerebral white matter.
9. Known chronic right basal ganglia lacunar infarct.
10. Mild generalized parenchymal atrophy.

MRA head:

1. Significantly motion degraded and limited examination, as
described.
2. No proximal large vessel occlusion identified within the
intracranial internal carotid arteries, M1 middle cerebral arteries,
proximal anterior cerebral arteries, intracranial vertebral
arteries, basilar artery or proximal posterior cerebral arteries.
The examination is non-diagnostic more distally.
3. 2 mm inferiorly projecting vascular protrusion arising from the
cavernous left ICA, likely reflecting an aneurysm.

## 2020-09-11 MED ORDER — IPRATROPIUM-ALBUTEROL 0.5-2.5 (3) MG/3ML IN SOLN
3.0000 mL | Freq: Four times a day (QID) | RESPIRATORY_TRACT | Status: DC
  Administered 2020-09-11 – 2020-09-15 (×15): 3 mL via RESPIRATORY_TRACT
  Filled 2020-09-11 (×16): qty 3

## 2020-09-11 MED ORDER — ACETAMINOPHEN 160 MG/5ML PO SOLN: 650.0000 mg | ORAL | Status: DC | PRN

## 2020-09-11 MED ORDER — METHYLPREDNISOLONE SODIUM SUCC 125 MG IJ SOLR
80.0000 mg | Freq: Once | INTRAMUSCULAR | Status: AC
  Administered 2020-09-11: 80 mg via INTRAVENOUS
  Filled 2020-09-11: qty 2

## 2020-09-11 MED ORDER — ROSUVASTATIN CALCIUM 20 MG PO TABS
20.0000 mg | ORAL_TABLET | Freq: Every day | ORAL | Status: DC
  Administered 2020-09-11 – 2020-09-13 (×3): 20 mg via ORAL
  Filled 2020-09-11 (×3): qty 1

## 2020-09-11 MED ORDER — ACETAMINOPHEN 650 MG RE SUPP
650.0000 mg | RECTAL | Status: DC | PRN
  Administered 2020-09-11 – 2020-09-13 (×4): 650 mg via RECTAL
  Filled 2020-09-11 (×4): qty 1

## 2020-09-11 MED ORDER — SODIUM CHLORIDE 0.9 % IV SOLN
500.0000 mg | INTRAVENOUS | Status: DC
  Administered 2020-09-11 – 2020-09-15 (×5): 500 mg via INTRAVENOUS
  Filled 2020-09-11 (×6): qty 500

## 2020-09-11 MED ORDER — BUDESONIDE 0.5 MG/2ML IN SUSP
0.5000 mg | Freq: Two times a day (BID) | RESPIRATORY_TRACT | Status: DC
  Administered 2020-09-11 – 2020-09-27 (×33): 0.5 mg via RESPIRATORY_TRACT
  Filled 2020-09-11 (×33): qty 2

## 2020-09-11 MED ORDER — NITROGLYCERIN 0.4 MG SL SUBL
0.4000 mg | SUBLINGUAL_TABLET | SUBLINGUAL | Status: DC | PRN
Start: 2020-09-11 — End: 2020-09-27

## 2020-09-11 MED ORDER — IOHEXOL 350 MG/ML SOLN
100.0000 mL | Freq: Once | INTRAVENOUS | Status: AC | PRN
  Administered 2020-09-11: 100 mL via INTRAVENOUS

## 2020-09-11 MED ORDER — IPRATROPIUM-ALBUTEROL 0.5-2.5 (3) MG/3ML IN SOLN
3.0000 mL | Freq: Once | RESPIRATORY_TRACT | Status: AC
  Administered 2020-09-11: 3 mL via RESPIRATORY_TRACT
  Filled 2020-09-11: qty 3

## 2020-09-11 MED ORDER — SODIUM CHLORIDE 0.9 % IV SOLN: INTRAVENOUS | Status: DC

## 2020-09-11 MED ORDER — CARBAMAZEPINE ER 200 MG PO TB12
400.0000 mg | ORAL_TABLET | Freq: Two times a day (BID) | ORAL | Status: DC
  Administered 2020-09-12 – 2020-09-13 (×3): 400 mg via ORAL
  Filled 2020-09-11 (×11): qty 1

## 2020-09-11 MED ORDER — PIPERACILLIN-TAZOBACTAM 3.375 G IVPB 30 MIN
3.3750 g | Freq: Once | INTRAVENOUS | Status: AC
  Administered 2020-09-11: 3.375 g via INTRAVENOUS
  Filled 2020-09-11: qty 50

## 2020-09-11 MED ORDER — STROKE: EARLY STAGES OF RECOVERY BOOK
Freq: Once | Status: AC
  Filled 2020-09-11: qty 1

## 2020-09-11 MED ORDER — ASPIRIN 300 MG RE SUPP
300.0000 mg | Freq: Every day | RECTAL | Status: DC
  Administered 2020-09-11 – 2020-09-16 (×6): 300 mg via RECTAL
  Filled 2020-09-11 (×7): qty 1

## 2020-09-11 MED ORDER — ENOXAPARIN SODIUM 40 MG/0.4ML IJ SOSY
40.0000 mg | PREFILLED_SYRINGE | INTRAMUSCULAR | Status: DC
  Administered 2020-09-11 – 2020-09-17 (×7): 40 mg via SUBCUTANEOUS
  Filled 2020-09-11 (×7): qty 0.4

## 2020-09-11 MED ORDER — FUROSEMIDE 10 MG/ML IJ SOLN
40.0000 mg | Freq: Once | INTRAMUSCULAR | Status: AC
  Administered 2020-09-11: 40 mg via INTRAVENOUS
  Filled 2020-09-11: qty 4

## 2020-09-11 MED ORDER — CYANOCOBALAMIN 1000 MCG/ML IJ SOLN
1000.0000 ug | INTRAMUSCULAR | Status: AC
  Administered 2020-09-11: 1000 ug via INTRAMUSCULAR
  Filled 2020-09-11: qty 1

## 2020-09-11 MED ORDER — SENNOSIDES-DOCUSATE SODIUM 8.6-50 MG PO TABS: 1.0000 | ORAL_TABLET | Freq: Every evening | ORAL | Status: DC | PRN

## 2020-09-11 MED ORDER — ESCITALOPRAM OXALATE 10 MG PO TABS
10.0000 mg | ORAL_TABLET | Freq: Every day | ORAL | Status: DC
  Administered 2020-09-11 – 2020-09-13 (×3): 10 mg via ORAL
  Filled 2020-09-11 (×4): qty 1

## 2020-09-11 MED ORDER — PIPERACILLIN-TAZOBACTAM 3.375 G IVPB
3.3750 g | Freq: Three times a day (TID) | INTRAVENOUS | Status: DC
Start: 2020-09-11 — End: 2020-09-24
  Administered 2020-09-11 – 2020-09-24 (×39): 3.375 g via INTRAVENOUS
  Filled 2020-09-11 (×39): qty 50

## 2020-09-11 MED ORDER — METHYLPREDNISOLONE SODIUM SUCC 125 MG IJ SOLR
60.0000 mg | Freq: Two times a day (BID) | INTRAMUSCULAR | Status: DC
  Administered 2020-09-11 – 2020-09-15 (×8): 60 mg via INTRAVENOUS
  Filled 2020-09-11 (×8): qty 2

## 2020-09-11 MED ORDER — FENOFIBRATE 54 MG PO TABS
54.0000 mg | ORAL_TABLET | Freq: Every day | ORAL | Status: DC
  Administered 2020-09-11 – 2020-09-13 (×3): 54 mg via ORAL
  Filled 2020-09-11 (×6): qty 1

## 2020-09-11 MED ORDER — ACETAMINOPHEN 325 MG PO TABS: 650.0000 mg | ORAL_TABLET | ORAL | Status: DC | PRN

## 2020-09-11 MED ORDER — ASPIRIN 81 MG PO CHEW
324.0000 mg | CHEWABLE_TABLET | Freq: Once | ORAL | Status: DC
  Filled 2020-09-11: qty 4

## 2020-09-11 NOTE — ED Triage Notes (Addendum)
BIB [facility] c/o shortness of breath. Used home nebs without relief. Given 1 neb with EMS. Admits to cocaine used yesterday AM. COVID exposures at home.

## 2020-09-11 NOTE — ED Notes (Signed)
Patient to MRI, SWOT nurse to take patient to floor after MRI complete.
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
## 2020-09-11 NOTE — Progress Notes (Signed)
Prior to the start of my shift the patient started to desat. Dayshift RN entered room and assisted patient suctioning, O2 was also increased. Pt told nurse that if his condition worsened he did not want to be on the breathing machine. He is "ok to die" is what he told RN. Patient spoke with wife about his wishes and he is not happy. His daughter called me and asked if she could "over ride" his decision. I told her no. MD and [facility] were made aware about situation. AC came to discuss code status with patient. Respiratory also discussed code status and he once again stated he did not want to be intubated. MD on the way to see patient and discuss code status. Pt in stable condition at moment.

## 2020-09-11 NOTE — Plan of Care (Signed)
  Problem: Acute Rehab PT Goals(only PT should resolve) Goal: Pt Will Go Supine/Side To Sit Outcome: Progressing Flowsheets (Taken 09/11/2020 1618) Pt will go Supine/Side to Sit:  with minimal assist  with moderate assist Goal: Patient Will Transfer Sit To/From Stand Outcome: Progressing Flowsheets (Taken 09/11/2020 1618) Patient will transfer sit to/from stand: with moderate assist Goal: Pt Will Transfer Bed To Chair/Chair To Bed Outcome: Progressing Flowsheets (Taken 09/11/2020 1618) Pt will Transfer Bed to Chair/Chair to Bed: with mod assist Goal: Pt Will Ambulate Outcome: Progressing Flowsheets (Taken 09/11/2020 1618) Pt will Ambulate:  15 feet  with moderate assist  with rolling walker Note: Hemi-walker   4:19 PM, 09/11/20 Ocie Bob, MPT Physical Therapist with [facility] 336 [phone] office [phone] mobile phone

## 2020-09-11 NOTE — Progress Notes (Signed)
NRB applied over top of 15lpm salter high flow cann to get pt spo2 up  Suction moderate amount frothy clear/white secretion from his throat with a great cough
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
## 2020-09-11 NOTE — Plan of Care (Signed)
  Problem: Education: Goal: Knowledge of disease or condition will improve Outcome: Progressing Goal: Knowledge of secondary prevention will improve Outcome: Progressing Goal: Knowledge of patient specific risk factors addressed and post discharge goals established will improve Outcome: Progressing   Problem: Ischemic Stroke/TIA Tissue Perfusion: Goal: Complications of ischemic stroke/TIA will be minimized Outcome: Progressing   
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
## 2020-09-11 NOTE — ED Notes (Signed)
Tele neurologist assessment in progress

## 2020-09-11 NOTE — Evaluation (Signed)
Physical Therapy Evaluation Patient Details Name: Brent [name] MRN: [id number] DOB: Jul 14, 1965 Today's Date: 09/11/2020   History of Present Illness  Brent [name] is a 55 y.o. male with medical history of hypertension, hyperlipidemia, diabetes mellitus type 2, cocaine abuse, tobacco abuse, recent stroke presented with 2 to 3-day history of slurred speech, left hemiparesis, and shortness of breath.  The patient is a poor historian.  History is taken from review of the medical record and speaking with the patient's significant other.  Patient has been having worsening shortness of breath and coughing for the past 2 to 3 days.  There is no fevers, chills, hemoptysis, nausea, vomiting, diarrhea, chest pain.  He continues to smoke cigarettes.  He last smoked cocaine on 09/09/2020.  He denies any headache, visual disturbance.  He states that his left hemiparesis is about the same as usual.  There is no abdominal pain, dysuria, hematuria.  In the emergency department, the patient was afebrile.  He was tachycardic up to 105.  Oxygen saturation was 90-92% on 3 L.   Clinical Impression  Patient demonstrates slow labored movement for sitting up at bedside mostly due to left side weakness, unable to maintain sitting balance with frequent falling over to the left and unable to stand due to weakness and poor trunk control.  Patient demonstrates fair/good return for using RUE, RLE for helping to reposition self in bed with Max/mod assist.  Patient will benefit from continued physical therapy in hospital and recommended venue below to increase strength, balance, endurance for safe ADLs and gait.      Follow Up Recommendations CIR    Equipment Recommendations  None recommended by PT    Recommendations for Other Services       Precautions / Restrictions Precautions Precautions: Fall Restrictions Weight Bearing Restrictions: No      Mobility  Bed Mobility Overal bed mobility: Needs  Assistance Bed Mobility: Supine to Sit;Sit to Supine     Supine to sit: Max assist Sit to supine: Mod assist;Max assist   General bed mobility comments: limited mostly due to left side weakness, unable to maintain sitting balance with frequent falling over to the left    Transfers                    Ambulation/Gait                Stairs            Wheelchair Mobility    Modified Rankin (Stroke Patients Only)       Balance Overall balance assessment: Needs assistance Sitting-balance support: Feet supported;Bilateral upper extremity supported Sitting balance-Leahy Scale: Poor Sitting balance - Comments: seated at EOB Postural control: Left lateral lean                                   Pertinent Vitals/Pain Pain Assessment: No/denies pain    Home Living Family/patient expects to be discharged to:: Private residence Living Arrangements: Spouse/significant other Available Help at Discharge: Family;Available 24 hours/day Type of Home: House Home Access: Level entry     Home Layout: One level Home Equipment: Toilet riser;Walker - 2 wheels;Bedside commode;Shower seat;Grab bars - toilet;Cane - quad;Wheelchair - manual;Other (comment)      Prior Function Level of Independence: Needs assistance   Gait / Transfers Assistance Needed: Patient states RW use for ambulation, family assists PRN  ADL's / Homemaking Assistance Needed:  Assisted by family        Hand Dominance   Dominant Hand: Right    Extremity/Trunk Assessment   Upper Extremity Assessment Upper Extremity Assessment: Defer to OT evaluation    Lower Extremity Assessment Lower Extremity Assessment: Generalized weakness;LLE deficits/detail LLE Deficits / Details: grossly -3/5 LLE Sensation: decreased light touch LLE Coordination: decreased fine motor;decreased gross motor    Cervical / Trunk Assessment Cervical / Trunk Assessment: Kyphotic  Communication    Communication: Expressive difficulties  Cognition Arousal/Alertness: Awake/alert Behavior During Therapy: WFL for tasks assessed/performed Overall Cognitive Status: Within Functional Limits for tasks assessed                                        General Comments      Exercises     Assessment/Plan    PT Assessment Patient needs continued PT services  PT Problem List Decreased activity tolerance;Decreased strength;Decreased balance;Decreased mobility       PT Treatment Interventions DME instruction;Gait training;Stair training;Functional mobility training;Therapeutic activities;Therapeutic exercise;Patient/family education;Balance training    PT Goals (Current goals can be found in the Care Plan section)  Acute Rehab PT Goals Patient Stated Goal: return home after rehab PT Goal Formulation: With patient Time For Goal Achievement: 09/25/20 Potential to Achieve Goals: Good    Frequency Min 4X/week   Barriers to discharge        Co-evaluation               AM-PAC PT "6 Clicks" Mobility  Outcome Measure Help needed turning from your back to your side while in a flat bed without using bedrails?: A Lot Help needed moving from lying on your back to sitting on the side of a flat bed without using bedrails?: A Lot Help needed moving to and from a bed to a chair (including a wheelchair)?: Total Help needed standing up from a chair using your arms (e.g., wheelchair or bedside chair)?: Total Help needed to walk in hospital room?: Total Help needed climbing 3-5 steps with a railing? : Total 6 Click Score: 8    End of Session   Activity Tolerance: Patient tolerated treatment well;Patient limited by fatigue Patient left: in bed;with call bell/phone within reach Nurse Communication: Mobility status PT Visit Diagnosis: Unsteadiness on feet (R26.81);Other abnormalities of gait and mobility (R26.89);Muscle weakness (generalized) (M62.81)    Time:  1497-0263 PT Time Calculation (min) (ACUTE ONLY): 27 min   Charges:   PT Evaluation $PT Eval Moderate Complexity: 1 Mod PT Treatments $Therapeutic Activity: 23-37 mins        4:17 PM, 09/11/20 Ocie Bob, MPT Physical Therapist with [facility] [phone] office [phone] mobile phone

## 2020-09-11 NOTE — Progress Notes (Signed)
Responded to nursing call:  aspirational event   Subjective: RN reports cough/choking after eating dinner.  Patient is impulsive with his eating.  Pt states sob is about same as earlier.  Denies cp, vomiting.  Actively coughing while I was in room  Vitals:   09/11/20 1237 09/11/20 1306 09/11/20 1435 09/11/20 1635  BP: (!) 152/75  130/78 137/76  Pulse: 78  85 89  Resp: 20  20 20   Temp: 97.9 F (36.6 C)  98.1 F (36.7 C) 97.8 F (36.6 C)  TempSrc: Axillary  Axillary Axillary  SpO2: 96% 97% 95% 95%  Weight:      Height:       CV--RRR Lung--bilateral rales.  Bibasilar wheeze Abd--soft+BS/NT   Assessment/Plan: Respiratory distress/failure -likely aspirational event -stat CXR--personally reviewed--patchy bilateral opacities, chronic interstitial markings -previously on 10L-->increased to 15L -lasix 40 mg IV x 1 -saline lock IVF -continue zosyn, duonebs, pulmicort, IV solumedrol -make npo except for meds for now -oral intake only under supervision     , DO [facility]

## 2020-09-11 NOTE — Progress Notes (Signed)
Code stroke Call time  305 Start   316 End   322 Soc   322 Epic   322 Rad called  [phone]

## 2020-09-11 NOTE — Progress Notes (Signed)
Pt C/O acute chest pain/ tightness, stat standing orders placed, 12 lead obtained. Notified [place]

## 2020-09-11 NOTE — H&P (Signed)
History and Physical  Brent [name]:[id number] DOB: 03-31-1965 DOA: 09/11/2020   PCP: Beatrix Fetters, MD   Patient coming from: Home  Chief Complaint: sob, slurred speech  HPI:  Brent [name] is a 55 y.o. male with medical history of hypertension, hyperlipidemia, diabetes mellitus type 2, cocaine abuse, tobacco abuse, recent stroke presented with 2 to 3-day history of slurred speech, left hemiparesis, and shortness of breath.  The patient is a poor historian.  History is taken from review of the medical record and speaking with the patient's significant other.  Patient has been having worsening shortness of breath and coughing for the past 2 to 3 days.  There is no fevers, chills, hemoptysis, nausea, vomiting, diarrhea, chest pain.  He continues to smoke cigarettes.  He last smoked cocaine on 09/09/2020.  He denies any headache, visual disturbance.  He states that his left hemiparesis is about the same as usual.  There is no abdominal pain, dysuria, hematuria. In the emergency department, the patient was afebrile.  He was tachycardic up to 105.  Oxygen saturation was 90-92% on 3 L.  Assessment/Plan: Acute respiratory failure with hypoxia -due to aspiration pneumonia and COPD exacerbation -Currently on 10 L nasal cannula  Aspiration pneumonia -Start IV Zosyn -Speech therapy evaluation  COPD exacerbation -Start Pulmicort -Start duo nebs -Start IV Solu-Medrol  Left Hemiparesis/Dysarthria -CTA head and neck--negative for LVO; 55% left carotid, 35% right carotid stenosis -MRI brain -CT perfusion brain--no acute core infarct, 7 cc area of delayed perfusion in the right posterior temporal occipital lobe -Continue aspirin and Plavix  Sepsis  -due to pneumonia -present on admission -start IVF -check lactic -check PCT -start IV zosyn  Polysubstance abuse -Including cocaine and tobacco -Last used cocaine 09/09/2020 -Cessation discussed  Diabetes mellitus type  2 -NovoLog sliding scale -Reduced dose Lantus  Essential hypertension -Allow permissive hypertension at this time -Holding metoprolol and benazepril -Holding Janumet  Hyperlipidemia -Continue Crestor and fenofibrate  Chronic back pain -Continue gabapentin  Active Problems:   CVA (cerebral vascular accident) (HCC)   Acute respiratory failure with hypoxia (HCC)       Past Medical History:  Diagnosis Date   Aplastic anemia, unspecified (HCC)    Chest pain, unspecified    Chronic airway obstruction, not elsewhere classified    Cocaine substance abuse (HCC)    Neuropathy    Other and unspecified hyperlipidemia    Shortness of breath    Stroke (HCC) 06/17/2020   Tobacco use disorder    Type II or unspecified type diabetes mellitus without mention of complication, not stated as uncontrolled    Unspecified epilepsy without mention of intractable epilepsy    Unspecified essential hypertension    Past Surgical History:  Procedure Laterality Date   CARDIAC CATHETERIZATION  10/02/2010    Nonobstructive mild coronary plaque   KNEE SURGERY Bilateral 2018   Repair   PERFORATED VISCUS SURGERY     MULTIPLE FRACTURES   Social History:  reports that he has been smoking cigarettes. He has a 30.00 pack-year smoking history. He has never used smokeless tobacco. He reports current alcohol use. He reports that he does not use drugs.   Family History  Problem Relation Age of Onset   Heart attack Father    COPD Father    Coronary artery disease Mother      No Known Allergies   Prior to Admission medications   Medication Sig Start Date End Date Taking? Authorizing Provider  albuterol (PROVENTIL) (2.5 MG/3ML) 0.083% nebulizer solution Take 2.5 mg by nebulization every 4 (four) hours as needed for wheezing or shortness of breath.    [provider]  albuterol (VENTOLIN HFA) 108 (90 Base) MCG/ACT inhaler Inhale 2 puffs into the lungs every 6 (six) hours as needed for  wheezing or shortness of breath.    [provider]  alclomethasone (ACLOVATE) 0.05 % cream Apply 1 application topically 2 (two) times daily.    [provider]  aspirin EC 81 MG tablet Take 81 mg by mouth daily. Swallow whole.    [provider]  benazepril (LOTENSIN) 10 MG tablet Take 20 mg by mouth daily.    [provider]  budesonide-formoterol (SYMBICORT) 160-4.5 MCG/ACT inhaler Inhale 2 puffs into the lungs in the morning and at bedtime.  06/29/18   [provider]  carbamazepine (TEGRETOL XR) 400 MG 12 hr tablet Take 1 tablet (400 mg total) by mouth 2 (two) times daily. 06/05/20   Van Clines, MD  clopidogrel (PLAVIX) 75 MG tablet Take 75 mg by mouth daily. 06/20/20   [provider]  dextromethorphan-guaiFENesin (MUCINEX DM) 30-600 MG 12hr tablet Take 1 tablet by mouth 2 (two) times daily. 11/08/19   Swayze, Ava, DO  escitalopram (LEXAPRO) 10 MG tablet Take 10 mg by mouth daily.  06/01/18   [provider]  fenofibrate 160 MG tablet Take 48 mg by mouth daily. 12/11/16   [provider]  food thickener (SIMPLYTHICK, HONEY/LEVEL 3/MODERATELY THICK,) LIQD Take 1 packet by mouth as needed. 07/29/20 11/26/20  Vassie Loll, MD  gabapentin (NEURONTIN) 300 MG capsule Take 3 caps in AM, 3 caps at noon, 4 caps at bedtime 06/05/20   Van Clines, MD  insulin glargine (LANTUS) 100 UNIT/ML injection Inject 70 Units into the skin daily.    [provider]  JANUMET 50-1000 MG tablet Take 1 tablet by mouth 2 (two) times daily. 07/10/20   [provider]  metoprolol tartrate (LOPRESSOR) 50 MG tablet Take 50 mg by mouth 2 (two) times daily. 05/29/20   [provider]  Omega-3 Fatty Acids (FISH OIL) 1000 MG CAPS Take 500 mg by mouth 2 (two) times daily.    [provider]  omeprazole (PRILOSEC) 40 MG capsule Take 1 capsule (40 mg total) by mouth 2 (two) times daily. 03/21/19   Tawni Pummel B, PA-C   rosuvastatin (CRESTOR) 20 MG tablet Take 1 tablet (20 mg total) by mouth daily. 07/30/20   Vassie Loll, MD    Review of Systems:  Constitutional:  No weight loss, night sweats, Fevers, chills, fatigue.  Head&Eyes: No headache.  No vision loss.  No eye pain or scotoma ENT:  No Difficulty swallowing,Tooth/dental problems,Sore throat,  No ear ache, post nasal drip,  Cardio-vascular:  No chest pain, Orthopnea, PND, swelling in lower extremities,  dizziness, palpitations  GI:  No  abdominal pain, nausea, vomiting, diarrhea, loss of appetite, hematochezia, melena, heartburn, indigestion, Resp:  No shortness of breath with exertion or at rest. No cough. No coughing up of blood .No wheezing.No chest wall deformity  Skin:  no rash or lesions.  GU:  no dysuria, change in color of urine, no urgency or frequency. No flank pain.  Musculoskeletal:  No joint pain or swelling. No decreased range of motion. No back pain.  Psych:  No change in mood or affect. No depression or anxiety. Neurologic: No headache, no dysesthesia, no focal weakness, no vision loss. No syncope  Physical  Exam: Vitals:   09/11/20 0545 09/11/20 0630 09/11/20 0700 09/11/20 0715  BP:  (!) 163/97 119/75   Pulse:  (!) 105 83 81  Resp:  14 (!) 21 20  Temp:      SpO2: 92% (!) 83% (!) 88% 90%  Weight:      Height:       General:  A&O x 3, NAD, nontoxic, pleasant/cooperative Head/Eye: No conjunctival hemorrhage, no icterus, [place]/AT, No nystagmus ENT:  No icterus,  No thrush, good dentition, no pharyngeal exudate Neck:  No masses, no lymphadenpathy, no bruits CV:  RRR, no rub, no gallop, no S3 Lung:  CTAB, good air movement, no wheeze, no rhonchi Abdomen: soft/NT, +BS, nondistended, no peritoneal signs Ext: No cyanosis, No rashes, No petechiae, No lymphangitis, No edema Neuro: CNII-XII intact, strength 4/5 in bilateral upper and lower extremities, no dysmetria  Labs on Admission:  Basic Metabolic Panel: Recent Labs   Lab 09/11/20 0312  NA 140  K 3.8  CL 104  CO2 29  GLUCOSE 135*  BUN 16  CREATININE 0.87  CALCIUM 9.3   Liver Function Tests: Recent Labs  Lab 09/11/20 0312  AST 12*  ALT 11  ALKPHOS 70  BILITOT 0.3  PROT 7.0  ALBUMIN 3.7   No results for input(s): LIPASE, AMYLASE in the last 168 hours. No results for input(s): AMMONIA in the last 168 hours. CBC: Recent Labs  Lab 09/11/20 0312  WBC 15.3*  NEUTROABS 12.3*  HGB 13.2  HCT 40.0  MCV 98.5  PLT 276   Coagulation Profile: Recent Labs  Lab 09/11/20 0312  INR 0.9   Cardiac Enzymes: No results for input(s): CKTOTAL, CKMB, CKMBINDEX, TROPONINI in the last 168 hours. BNP: Invalid input(s): POCBNP CBG: Recent Labs  Lab 09/11/20 0346  GLUCAP 119*   Urine analysis: No results found for: COLORURINE, APPEARANCEUR, LABSPEC, PHURINE, GLUCOSEU, HGBUR, BILIRUBINUR, KETONESUR, PROTEINUR, UROBILINOGEN, NITRITE, LEUKOCYTESUR Sepsis Labs: @LABRCNTIP (procalcitonin:4,lacticidven:4) ) Recent Results (from the past 240 hour(s))  Resp Panel by RT-PCR (Flu A&B, Covid) Nasopharyngeal Swab     Status: None   Collection Time: 09/11/20  3:07 AM   Specimen: Nasopharyngeal Swab; Nasopharyngeal(NP) swabs in vial transport medium  Result Value Ref Range Status   SARS Coronavirus 2 by RT PCR NEGATIVE NEGATIVE Final    Comment: (NOTE) SARS-CoV-2 target nucleic acids are NOT DETECTED.  The SARS-CoV-2 RNA is generally detectable in upper respiratory specimens during the acute phase of infection. The lowest concentration of SARS-CoV-2 viral copies this assay can detect is 138 copies/mL. A negative result does not preclude SARS-Cov-2 infection and should not be used as the sole basis for treatment or other patient management decisions. A negative result may occur with  improper specimen collection/handling, submission of specimen other than nasopharyngeal swab, presence of viral mutation(s) within the areas targeted by this assay, and  inadequate number of viral copies(<138 copies/mL). A negative result must be combined with clinical observations, patient history, and epidemiological information. The expected result is Negative.  Fact Sheet for Patients:  BloggerCourse.comhttps://www.fda.gov/media/152166/download  Fact Sheet for Healthcare Providers:  SeriousBroker.ithttps://www.fda.gov/media/152162/download  This test is no t yet approved or cleared by the [place] FDA and  has been authorized for detection and/or diagnosis of SARS-CoV-2 by FDA under an Emergency Use Authorization (EUA). This EUA will remain  in effect (meaning this test can be used) for the duration of the COVID-19 declaration under Section 564(b)(1) of the Act, 21 U.S.C.section 360bbb-3(b)(1), unless the authorization is terminated  or revoked sooner.  Influenza A by PCR NEGATIVE NEGATIVE Final   Influenza B by PCR NEGATIVE NEGATIVE Final    Comment: (NOTE) The Xpert Xpress SARS-CoV-2/FLU/RSV plus assay is intended as an aid in the diagnosis of influenza from Nasopharyngeal swab specimens and should not be used as a sole basis for treatment. Nasal washings and aspirates are unacceptable for Xpert Xpress SARS-CoV-2/FLU/RSV testing.  Fact Sheet for Patients: BloggerCourse.com  Fact Sheet for Healthcare Providers: SeriousBroker.it  This test is not yet approved or cleared by the [place] FDA and has been authorized for detection and/or diagnosis of SARS-CoV-2 by FDA under an Emergency Use Authorization (EUA). This EUA will remain in effect (meaning this test can be used) for the duration of the COVID-19 declaration under Section 564(b)(1) of the Act, 21 U.S.C. section 360bbb-3(b)(1), unless the authorization is terminated or revoked.  Performed at [facility], [address], [place], [place] [address]      Radiological Exams on Admission: DG Chest Portable 1 View  Result Date: 09/11/2020 CLINICAL  DATA:  Shortness of breath. EXAM: PORTABLE CHEST 1 VIEW COMPARISON:  08/29/2020. FINDINGS: Mediastinum hilar structures normal. Heart size normal. Left lung base infiltrate consistent with pneumonia. Bibasilar atelectasis. No pleural effusion or pneumothorax. Degenerative change thoracic spine. IMPRESSION: Left lung base infiltrate consistent with pneumonia. Bibasilar atelectasis. Electronically Signed   By: Maisie Fus  Register   On: 09/11/2020 05:56   CT HEAD CODE STROKE WO CONTRAST`  Result Date: 09/11/2020 CLINICAL DATA:  Code stroke. Initial evaluation for acute left-sided weakness. EXAM: CT ANGIOGRAPHY HEAD AND NECK CT PERFUSION BRAIN TECHNIQUE: Multidetector CT imaging of the head and neck was performed using the standard protocol during bolus administration of intravenous contrast. Multiplanar CT image reconstructions and MIPs were obtained to evaluate the vascular anatomy. Carotid stenosis measurements (when applicable) are obtained utilizing NASCET criteria, using the distal internal carotid diameter as the denominator. Multiphase CT imaging of the brain was performed following IV bolus contrast injection. Subsequent parametric perfusion maps were calculated using RAPID software. CONTRAST:  OMNIPAQUE IOHEXOL 350 MG/ML SOLN COMPARISON:  Prior MRI from 07/25/2020. FINDINGS: CT HEAD FINDINGS Brain: Generalized cerebral atrophy with advanced chronic microvascular ischemic disease. Multiple remote lacunar infarcts present about the bilateral basal ganglia and thalami. Chronic right frontal infarct, right ACA distribution. No acute intracranial hemorrhage. No visible acute large vessel territory infarct. No mass lesion, mass effect, or midline shift. No hydrocephalus or extra-axial fluid collection. Vascular: No hyperdense vessel. Scattered vascular calcifications noted within the carotid siphons. Skull: Scalp soft tissues demonstrate no acute finding. Calvarium intact. Sinuses/Orbits: Globes and orbital  soft tissues demonstrate no acute finding. Mild scattered mucosal thickening noted within the ethmoidal air cells and maxillary sinuses. Paranasal sinuses are otherwise clear. No mastoid effusion. Other: None. ASPECTS (Alberta Stroke Program Early CT Score) - Ganglionic level infarction (caudate, lentiform nuclei, internal capsule, insula, M1-M3 cortex): 7 - Supraganglionic infarction (M4-M6 cortex): 3 Total score (0-10 with 10 being normal): 10 Review of the MIP images confirms the above findings CTA NECK FINDINGS Aortic arch: Visualized aortic arch normal caliber with normal 3 vessel morphology. Mild atheromatous change about the arch and origin of the great vessels without hemodynamically significant stenosis. Right carotid system: Right CCA patent from its origin to the bifurcation without stenosis. Concentric mixed plaque about the right carotid bulb/proximal right ICA with mild 35% stenosis by NASCET criteria. Right ICA patent distally without stenosis, dissection or occlusion. Left carotid system: Scattered eccentric plaque within the mid left CCA with associated  narrowing of up to 40% by NASCET criteria. Bulky calcified plaque about the left carotid bulb/proximal left ICA with associated stenosis of up to 55% by NASCET criteria. Left ICA patent distally without stenosis, dissection or occlusion. Vertebral arteries: Both vertebral arteries arise from the subclavian arteries. No proximal subclavian artery stenosis. Vertebral arteries patent without stenosis, dissection or occlusion. Skeleton: No visible acute osseous finding. No discrete or worrisome osseous lesions. Other neck: No other acute soft tissue abnormality within the neck. No mass or adenopathy. Retained metallic density noted within the subcutaneous fat at the right face. Upper chest: Emphysematous changes noted within the visualized lungs. Few scattered patchy densities noted within the posterior left upper lobe, suspicious for  infection/pneumonia. Visualized upper chest demonstrates no other acute finding. Review of the MIP images confirms the above findings CTA HEAD FINDINGS Anterior circulation: Petrous segments patent bilaterally. Mild atheromatous change within the carotid siphons without significant stenosis. A1 segments widely patent. Normal anterior communicating artery complex. Anterior cerebral arteries patent to their distal aspects without stenosis. No M1 stenosis or occlusion. Normal MCA bifurcations. No visible proximal MCA branch occlusion. Distal MCA branches well perfused and symmetric. Posterior circulation: Both V4 segments patent to the vertebrobasilar junction without stenosis. Right vertebral artery slightly dominant. Left PICA patent. Right PICA not seen. Basilar patent to its distal aspect without stenosis. Superior cerebral arteries patent bilaterally. Both PCAs primarily supplied via the basilar well perfused to their distal aspects. Venous sinuses: Grossly patent allowing for timing the contrast bolus. Anatomic variants: None significant.  No aneurysm. Review of the MIP images confirms the above findings CT Brain Perfusion Findings: ASPECTS: 10. CBF (<30%) Volume: 46mL Perfusion (Tmax>6.0s) volume: 68mL Mismatch Volume: 63mL Infarction Location:No acute core infarct by CT perfusion. 7 cc did area of delayed perfusion involving the posterior right temporal occipital region. IMPRESSION: CT HEAD IMPRESSION: 1. No acute intracranial abnormality. 2. Aspects = 10. 3. Age-related cerebral atrophy with advanced chronic microvascular ischemic disease, with multiple remote lacunar infarcts about the bilateral basal ganglia and thalami, with additional chronic right frontal infarct. CTA HEAD AND NECK IMPRESSION: 1. Negative CTA for emergent large vessel occlusion. 2. Atheromatous change about the carotid bifurcations with associated stenosis of up to 55% on the left and 35% on the right. 3. Additional mild atheromatous change  elsewhere about the major arterial vasculature of the head and neck as above. No other proximal high-grade or correctable stenosis. 4. Mild scattered patchy and nodular densities within the partially visualized left upper lobe, suspicious for acute infection/pneumonia. 5.  Emphysema (ICD10-J43.9). CT PERFUSION IMPRESSION: 1. No evidence for acute core infarct by CT perfusion. 2. 7 cc area of delayed perfusion within the posterior right temporoccipital region, which could reflect an area of all vein ischemia. Correlation with dedicated brain MRI suggested as warranted. Critical Value/emergent results were called by telephone at the time of interpretation on 09/11/2020 at 3:50 am to provider [facility] , who verbally acknowledged these results. Electronically Signed   By: Rise Mu M.D.   On: 09/11/2020 04:14   CT ANGIO HEAD NECK W WO CM W PERF (CODE STROKE)  Result Date: 09/11/2020 CLINICAL DATA:  Code stroke. Initial evaluation for acute left-sided weakness. EXAM: CT ANGIOGRAPHY HEAD AND NECK CT PERFUSION BRAIN TECHNIQUE: Multidetector CT imaging of the head and neck was performed using the standard protocol during bolus administration of intravenous contrast. Multiplanar CT image reconstructions and MIPs were obtained to evaluate the vascular anatomy. Carotid stenosis measurements (when applicable) are obtained  utilizing NASCET criteria, using the distal internal carotid diameter as the denominator. Multiphase CT imaging of the brain was performed following IV bolus contrast injection. Subsequent parametric perfusion maps were calculated using RAPID software. CONTRAST:  OMNIPAQUE IOHEXOL 350 MG/ML SOLN COMPARISON:  Prior MRI from 07/25/2020. FINDINGS: CT HEAD FINDINGS Brain: Generalized cerebral atrophy with advanced chronic microvascular ischemic disease. Multiple remote lacunar infarcts present about the bilateral basal ganglia and thalami. Chronic right frontal infarct, right ACA  distribution. No acute intracranial hemorrhage. No visible acute large vessel territory infarct. No mass lesion, mass effect, or midline shift. No hydrocephalus or extra-axial fluid collection. Vascular: No hyperdense vessel. Scattered vascular calcifications noted within the carotid siphons. Skull: Scalp soft tissues demonstrate no acute finding. Calvarium intact. Sinuses/Orbits: Globes and orbital soft tissues demonstrate no acute finding. Mild scattered mucosal thickening noted within the ethmoidal air cells and maxillary sinuses. Paranasal sinuses are otherwise clear. No mastoid effusion. Other: None. ASPECTS (Alberta Stroke Program Early CT Score) - Ganglionic level infarction (caudate, lentiform nuclei, internal capsule, insula, M1-M3 cortex): 7 - Supraganglionic infarction (M4-M6 cortex): 3 Total score (0-10 with 10 being normal): 10 Review of the MIP images confirms the above findings CTA NECK FINDINGS Aortic arch: Visualized aortic arch normal caliber with normal 3 vessel morphology. Mild atheromatous change about the arch and origin of the great vessels without hemodynamically significant stenosis. Right carotid system: Right CCA patent from its origin to the bifurcation without stenosis. Concentric mixed plaque about the right carotid bulb/proximal right ICA with mild 35% stenosis by NASCET criteria. Right ICA patent distally without stenosis, dissection or occlusion. Left carotid system: Scattered eccentric plaque within the mid left CCA with associated narrowing of up to 40% by NASCET criteria. Bulky calcified plaque about the left carotid bulb/proximal left ICA with associated stenosis of up to 55% by NASCET criteria. Left ICA patent distally without stenosis, dissection or occlusion. Vertebral arteries: Both vertebral arteries arise from the subclavian arteries. No proximal subclavian artery stenosis. Vertebral arteries patent without stenosis, dissection or occlusion. Skeleton: No visible acute  osseous finding. No discrete or worrisome osseous lesions. Other neck: No other acute soft tissue abnormality within the neck. No mass or adenopathy. Retained metallic density noted within the subcutaneous fat at the right face. Upper chest: Emphysematous changes noted within the visualized lungs. Few scattered patchy densities noted within the posterior left upper lobe, suspicious for infection/pneumonia. Visualized upper chest demonstrates no other acute finding. Review of the MIP images confirms the above findings CTA HEAD FINDINGS Anterior circulation: Petrous segments patent bilaterally. Mild atheromatous change within the carotid siphons without significant stenosis. A1 segments widely patent. Normal anterior communicating artery complex. Anterior cerebral arteries patent to their distal aspects without stenosis. No M1 stenosis or occlusion. Normal MCA bifurcations. No visible proximal MCA branch occlusion. Distal MCA branches well perfused and symmetric. Posterior circulation: Both V4 segments patent to the vertebrobasilar junction without stenosis. Right vertebral artery slightly dominant. Left PICA patent. Right PICA not seen. Basilar patent to its distal aspect without stenosis. Superior cerebral arteries patent bilaterally. Both PCAs primarily supplied via the basilar well perfused to their distal aspects. Venous sinuses: Grossly patent allowing for timing the contrast bolus. Anatomic variants: None significant.  No aneurysm. Review of the MIP images confirms the above findings CT Brain Perfusion Findings: ASPECTS: 10. CBF (<30%) Volume: 69mL Perfusion (Tmax>6.0s) volume: 44mL Mismatch Volume: 52mL Infarction Location:No acute core infarct by CT perfusion. 7 cc did area of delayed perfusion involving the posterior right  temporal occipital region. IMPRESSION: CT HEAD IMPRESSION: 1. No acute intracranial abnormality. 2. Aspects = 10. 3. Age-related cerebral atrophy with advanced chronic microvascular ischemic  disease, with multiple remote lacunar infarcts about the bilateral basal ganglia and thalami, with additional chronic right frontal infarct. CTA HEAD AND NECK IMPRESSION: 1. Negative CTA for emergent large vessel occlusion. 2. Atheromatous change about the carotid bifurcations with associated stenosis of up to 55% on the left and 35% on the right. 3. Additional mild atheromatous change elsewhere about the major arterial vasculature of the head and neck as above. No other proximal high-grade or correctable stenosis. 4. Mild scattered patchy and nodular densities within the partially visualized left upper lobe, suspicious for acute infection/pneumonia. 5.  Emphysema (ICD10-J43.9). CT PERFUSION IMPRESSION: 1. No evidence for acute core infarct by CT perfusion. 2. 7 cc area of delayed perfusion within the posterior right temporoccipital region, which could reflect an area of all vein ischemia. Correlation with dedicated brain MRI suggested as warranted. Critical Value/emergent results were called by telephone at the time of interpretation on 09/11/2020 at 3:50 am to provider Pam [facility] , who verbally acknowledged these results. Electronically Signed   By: Rise Mu M.D.   On: 09/11/2020 04:14    EKG: Independently reviewed. Sinus, no STT change    Time spent:60 minutes Code Status:   FULL Family Communication:  spouse updated Disposition Plan: expect 2-3 day hospitalization Consults called: neuro DVT Prophylaxis: [place] Lovenox/  Catarina Hartshorn, DO  [facility] Pager [phone]  If 7PM-7AM, please contact night-coverage [email or web address] Password [facility] 09/11/2020, 7:49 AM

## 2020-09-11 NOTE — ED Notes (Signed)
Patient transported to CT 

## 2020-09-11 NOTE — ED Provider Notes (Signed)
[facility]ENCY [facility]PARTMENT Provider Note   CSN: [id number] Arrival date & time: 09/11/20  0206     History Chief Complaint  Patient presents with   Shortness of Breath    Read Driversimothy C [name] is a 55 y.o. male.  Level 5 caveat for acuity of condition.  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  EMS reported shortness of breath using nebs at home without relief.  Patient concerned he could have pneumonia again.  On my assessment, patient has left-sided weakness, slurred speech and facial droop.  Does have a history of a stroke with residual deficits.  He states he is having difficulty using his left side for the past 2 days.  Additional history obtained by his wife from phone.  She states he left the house with normal speech but has been having intermittent slurred speech for 2 or 3 days.  Does have a history of left-sided weakness which does not appear to be changed today.  She states his left-sided speech is new since approximately 11 PM.  No chest pain or fever.   The history is provided by the patient, the EMS personnel and a relative. The history is limited by the condition of the patient.  Shortness of Breath     Past Medical History:  Diagnosis Date   Aplastic anemia, unspecified (HCC)    Chest pain, unspecified    Chronic airway obstruction, not elsewhere classified    Cocaine substance abuse (HCC)    Neuropathy    Other and unspecified hyperlipidemia    Shortness of breath    Stroke (HCC) 06/17/2020   Tobacco use disorder    Type II or unspecified type diabetes mellitus without mention of complication, not stated as uncontrolled    Unspecified epilepsy without mention of intractable epilepsy    Unspecified essential hypertension     Patient Active Problem List   Diagnosis Date Noted   Cocaine abuse (HCC)    Acute CVA (cerebrovascular accident) (HCC)  07/25/2020   Acute on chronic respiratory failure with hypoxia (HCC) 11/04/2019   Rhinovirus infection 11/04/2019   Essential hypertension 11/04/2019   CAP (community acquired pneumonia) 11/03/2019   Diabetic polyneuropathy associated with diabetes mellitus due to underlying condition (HCC) 02/06/2019   Localization-related (focal) (partial) symptomatic epilepsy and epileptic syndromes with complex partial seizures, intractable, without status epilepticus (HCC) 02/06/2019   Hyperlipidemia associated with type 2 diabetes mellitus (HCC) 11/26/2018   Cough 11/03/2018   Obstructive sleep apnea 11/03/2018   Dysphagia-----s/p Prior Uvulectomy and Now with Acute CVA 09/27/2018   Eczema 09/27/2018   Right leg weakness 01/19/2018   History of arthroscopy of knee 11/15/2017   Complex tear of lateral meniscus of right knee as current injury 10/27/2017   Effusion of right knee 07/26/2017   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   Cellulitis 07/22/2017   Primary osteoarthritis involving multiple joints 06/22/2017   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 02/01/2017   Right knee pain 02/01/2017   Acute bronchitis 12/07/2016   Acute idiopathic gout of left foot 12/07/2016   Anxiety, generalized 12/07/2016   COPD with acute exacerbation (HCC) 12/07/2016   Seizures (HCC) 12/07/2016   CAD (coronary artery disease) 12/19/2010   DM II (diabetes mellitus, type II), controlled (HCC) 12/19/2010   Hypertension associated with diabetes (HCC) 12/19/2010   GERD (gastroesophageal reflux disease) 12/19/2010   Hypertriglyceridemia 12/19/2010   Tobacco abuse  12/19/2010    Past Surgical History:  Procedure Laterality Date   CARDIAC CATHETERIZATION  10/02/2010    Nonobstructive mild coronary plaque   KNEE SURGERY Bilateral 2018   Repair   PERFORATED VISCUS SURGERY     MULTIPLE FRACTURES       Family History  Problem Relation Age of Onset   Heart attack Father    COPD  Father    Coronary artery disease Mother     Social History   Tobacco Use   Smoking status: Every Day    Packs/day: 1.00    Years: 30.00    Pack years: 30.00    Types: Cigarettes   Smokeless tobacco: Never  Substance Use Topics   Alcohol use: Yes    Comment: drinks beer on the weekends   Drug use: Never    Home Medications Prior to Admission medications   Medication Sig Start Date End Date Taking? Authorizing Provider  albuterol (PROVENTIL) (2.5 MG/3ML) 0.083% nebulizer solution Take 2.5 mg by nebulization every 4 (four) hours as needed for wheezing or shortness of breath.    [provider]  albuterol (VENTOLIN HFA) 108 (90 Base) MCG/ACT inhaler Inhale 2 puffs into the lungs every 6 (six) hours as needed for wheezing or shortness of breath.    [provider]  alclomethasone (ACLOVATE) 0.05 % cream Apply 1 application topically 2 (two) times daily.    [provider]  aspirin EC 81 MG tablet Take 81 mg by mouth daily. Swallow whole.    [provider]  benazepril (LOTENSIN) 10 MG tablet Take 20 mg by mouth daily.    [provider]  budesonide-formoterol (SYMBICORT) 160-4.5 MCG/ACT inhaler Inhale 2 puffs into the lungs in the morning and at bedtime.  06/29/18   [provider]  carbamazepine (TEGRETOL XR) 400 MG 12 hr tablet Take 1 tablet (400 mg total) by mouth 2 (two) times daily. 06/05/20   Van Clines, MD  clopidogrel (PLAVIX) 75 MG tablet Take 75 mg by mouth daily. 06/20/20   [provider]  dextromethorphan-guaiFENesin (MUCINEX DM) 30-600 MG 12hr tablet Take 1 tablet by mouth 2 (two) times daily. 11/08/19   Swayze, Ava, DO  escitalopram (LEXAPRO) 10 MG tablet Take 10 mg by mouth daily.  06/01/18   [provider]  fenofibrate 160 MG tablet Take 48 mg by mouth daily. 12/11/16   [provider]  food thickener (SIMPLYTHICK, HONEY/LEVEL 3/MODERATELY THICK,) LIQD Take 1 packet by mouth as needed.  07/29/20 11/26/20  Vassie Loll, MD  gabapentin (NEURONTIN) 300 MG capsule Take 3 caps in AM, 3 caps at noon, 4 caps at bedtime 06/05/20   Van Clines, MD  insulin glargine (LANTUS) 100 UNIT/ML injection Inject 70 Units into the skin daily.    [provider]  JANUMET 50-1000 MG tablet Take 1 tablet by mouth 2 (two) times daily. 07/10/20   [provider]  metoprolol tartrate (LOPRESSOR) 50 MG tablet Take 50 mg by mouth 2 (two) times daily. 05/29/20   [provider]  Omega-3 Fatty Acids (FISH OIL) 1000 MG CAPS Take 500 mg by mouth 2 (two) times daily.    [provider]  omeprazole (PRILOSEC) 40 MG capsule Take 1 capsule (40 mg total) by mouth 2 (two) times daily. 03/21/19   Tawni Pummel B, PA-C  rosuvastatin (CRESTOR) 20 MG tablet Take 1 tablet (20 mg total) by mouth daily. 07/30/20   Vassie Loll, MD    Allergies  Patient has no known allergies.  Review of Systems   Review of Systems  Unable to perform ROS: Acuity of condition  Respiratory:  Positive for shortness of breath.    Physical Exam Updated Vital Signs BP (!) 135/91   Pulse 91   Temp 98.2 F (36.8 C)   Resp 20   Ht 6' (1.829 m)   Wt 85.7 kg   SpO2 95%   BMI 25.62 kg/m   Physical Exam Vitals and nursing note reviewed.  Constitutional:      General: He is in acute distress.     Appearance: He is well-developed. He is ill-appearing.  HENT:     Head: Normocephalic and atraumatic.     Mouth/Throat:     Pharynx: No oropharyngeal exudate.  Eyes:     Conjunctiva/sclera: Conjunctivae normal.     Pupils: Pupils are equal, round, and reactive to light.  Neck:     Comments: No meningismus. Cardiovascular:     Rate and Rhythm: Normal rate and regular rhythm.     Heart sounds: Normal heart sounds. No murmur heard. Pulmonary:     Effort: Tachypnea and respiratory distress present.     Breath sounds: Wheezing present.     Comments: Increased work of breathing, tachypnea,  diminished breath sounds with expiratory wheezing. Abdominal:     Palpations: Abdomen is soft.     Tenderness: There is no abdominal tenderness. There is no guarding or rebound.  Musculoskeletal:        General: No tenderness. Normal range of motion.     Cervical back: Normal range of motion and neck supple.  Skin:    General: Skin is warm.  Neurological:     Mental Status: He is alert.     Cranial Nerves: Cranial nerve deficit present.     Motor: No abnormal muscle tone.     Coordination: Coordination normal.     Comments: Slurred speech, left-sided facial droop, weakness in left arm and leg unable to hold off the bed. Decreased grip strength on L. 5/5 strength in the right  Psychiatric:        Behavior: Behavior normal.    ED Results / Procedures / Treatments   Labs (all labs ordered are listed, but only abnormal results are displayed) Labs Reviewed - No data to display  EKG None  Radiology No results found.  Procedures .Critical Care  Date/Time: 09/11/2020 10:08 AM Performed by: Glynn Octave, MD Authorized by: Glynn Octave, MD   Critical care provider statement:    Critical care time (minutes):  60   Critical care was necessary to treat or prevent imminent or life-threatening deterioration of the following conditions:  Respiratory failure and CNS failure or compromise   Critical care was time spent personally by me on the following activities:  Discussions with consultants, evaluation of patient's response to treatment, examination of patient, ordering and performing treatments and interventions, ordering and review of laboratory studies, ordering and review of radiographic studies, pulse oximetry, re-evaluation of patient's condition, obtaining history from patient or surrogate and review of old charts   Medications Ordered in ED Medications - No data to display  ED Course  I have reviewed the triage vital signs and the nursing notes.  Pertinent labs &  imaging results that were available during my care of the patient were reviewed by me and considered in my medical decision making (see chart for details).    MDM Rules/Calculators/A&P  Patient called out for difficulty with breathing and concern for pneumonia.  However on arrival he has left-sided deficits and slurred speech.  He has a history of recurrent strokes with residual left-sided deficits.  Uncertain of his last seen normal or what his baseline is.  Discussed with his wife by phone.  She states he always has some left-sided weakness and she is uncertain if it is worse today.  She states he was not slurring his words when he left the house at 11 PM.  States he is having slurred speech coming and going for the past 2 days. Unclear last seen normal.  Code stroke was activated.  CT head is negative.  CT negative for large vessel occlusion.  No acute core infarct by CT perfusion. 7 cc did  area of delayed perfusion involving the posterior right temporal  occipital region.  D/w Dr. Phill Myron.   Patient seen by teleneurology Dr. Tylene Fantasia.  She feels patient is not a candidate for tPA given his unclear last seen normal, small stroke and recent stroke in June.  Not candidate for tPA given his recent stroke in June. Dr. Tylene Fantasia recommends MRI but no tPA or IR.  In terms of breathing, treated for COPD exacerbation with steroids and nebulizers.  CXR concerning for pneumonia and antibiotics begun.   Admission d/w Dr. Carren Rang.  Brent [name] was evaluated in Emergency Department on 09/11/2020 for the symptoms described in the history of present illness. He was evaluated in the context of the global COVID-19 pandemic, which necessitated consideration that the patient might be at risk for infection with the SARS-CoV-2 virus that causes COVID-19. Institutional protocols and algorithms that pertain to the evaluation of patients at risk for COVID-19 are in a state of  rapid change based on information released by regulatory bodies including the CDC and federal and state organizations. These policies and algorithms were followed during the patient's care in the ED.  Final Clinical Impression(s) / ED Diagnoses Final diagnoses:  Weakness    Rx / DC Orders ED Discharge Orders     None        Doaa Kendzierski, Jeannett Senior, MD 09/11/20 1010

## 2020-09-11 NOTE — Consult Note (Signed)
[facility] Brent Zoeller A. [name], MD     [email or web address]          Brent [name] is an 55 y.o. male.   ASSESSMENT/PLAN: Acute L frontal infarcts appearing embolic: Risk factors age, hypertension, dyslipidemia and the frequent cocaine use. Repeat Echo. Dual antiplatelet agents are recommended for 3 weeks. Afterwards a single agent is recommended. Continue with blood pressure control, dyslipidemia control and the discontinue using cocaine. This is discussed at length with the patient. The patient will need extensive occupational and physical therapies. Suspect that PEG will be needed for a while given severe weakness and dysphagia.     He presents with acute onset of severe R hemiparesis and dysarthria. Noted to be quite dysphagic in hospital. Emesis of food after feeding today. Some SOD and has pneumonia this admission and on oxygen. Denies CP, HA or dizziness. The review systems is otherwise negative.    GENERAL:  He appears to be in some discomfort but no acute distress.  HEENT:  There is mild ptosis on the right. There is severe weakness of the left lower facial muscles.  ABDOMEN: soft  EXTREMITIES: No edema   BACK: Normal  SKIN: Normal by inspection.    MENTAL STATUS: Alert and oriented -  include orientation to the month and his age. The patient is severely dysarthric. Cognition and language are normal.   CRANIAL NERVES: Pupils are equal, round and reactive to light and accomodation; extra ocular movements are full, there is no significant nystagmus; visual fields are full; tongue is midline  MOTOR:  He is plegic on the left side. Right side shows at least 4/5 strength in the upper extremities and no drift. Right leg is 3/5 and no drift.  COORDINATION:  There is no dysmetria noted. No tremors or parkinsonism.  REFLEXES: Deep tendon reflexes are symmetrical and normal.   SENSATION: Normal to light touch, temperature, and pain.  NIHSS 3,3,3 total  9.   NEURO NOTE 07-2020 Right thalamic capsular infarction presenting with dysarthria, left hemiparesis and gait impairment. Risk factors age, hypertension, dyslipidemia and the frequent cocaine use. Dual antiplatelet agents are recommended for 3 weeks. Afterwards a single agent is recommended. Continue with blood pressure control, dyslipidemia control and the discontinue using cocaine. This is discussed at length with the patient. The patient will need extensive occupational and physical therapies. 2.   Severe microvascular ischemic changes of the brain which increases the risk for long-term gait impairment and dementia. This is likely due to risk factors stated above. Additional labs will be obtained however. 3.    Remote lacunar infarcts    This is a 55 year old right-handed white male who presents with the acute onset of a falling associated with global weakness and left-sided weakness along with dysarthria.  He does not report having numbness or tingling. He reports having some headaches. He does report having some mild dyspnea but no chest pain or palpitation. No syncope is noted or loss of consciousness. The patient does have a history of severe hypertriglyceridemia and other did abnormal lipid function. He is on high-dose Lipitor. The patient reports that he has been using cocaine a few times a week over last few years. He reports his symptoms have remained unchanged while has been hospitalized. Imaging showed evidence of acute insignificant stroke. The review systems otherwise negative     Blood pressure 137/76, pulse 89, temperature 97.8 F (36.6 C), temperature source Axillary, resp. rate 20, height 6' (1.829 m), weight 85.7 kg, SpO2 95 %.  Past Medical History:  Diagnosis Date   Aplastic anemia, unspecified (HCC)    Chest pain, unspecified    Chronic airway obstruction, not elsewhere classified    Cocaine substance abuse (HCC)    Neuropathy    Other and unspecified hyperlipidemia     Shortness of breath    Stroke (HCC) 06/17/2020   Tobacco use disorder    Type II or unspecified type diabetes mellitus without mention of complication, not stated as uncontrolled    Unspecified epilepsy without mention of intractable epilepsy    Unspecified essential hypertension     Past Surgical History:  Procedure Laterality Date   CARDIAC CATHETERIZATION  10/02/2010    Nonobstructive mild coronary plaque   KNEE SURGERY Bilateral 2018   Repair   PERFORATED VISCUS SURGERY     MULTIPLE FRACTURES    Family History  Problem Relation Age of Onset   Heart attack Father    COPD Father    Coronary artery disease Mother     Social History:  reports that he has been smoking cigarettes. He has a 30.00 pack-year smoking history. He has never used smokeless tobacco. He reports current alcohol use. He reports that he does not use drugs.  Allergies: No Known Allergies  Medications: Prior to Admission medications   Medication Sig Start Date End Date Taking? Authorizing Provider  albuterol (PROVENTIL) (2.5 MG/3ML) 0.083% nebulizer solution Take 2.5 mg by nebulization every 4 (four) hours as needed for wheezing or shortness of breath.   Yes [provider]  albuterol (VENTOLIN HFA) 108 (90 Base) MCG/ACT inhaler Inhale 2 puffs into the lungs every 6 (six) hours as needed for wheezing or shortness of breath.   Yes [provider]  alclomethasone (ACLOVATE) 0.05 % cream Apply 1 application topically 2 (two) times daily.   Yes [provider]  amLODipine (NORVASC) 10 MG tablet Take 10 mg by mouth daily.   Yes [provider]  aspirin EC 81 MG tablet Take 81 mg by mouth daily. Swallow whole.   Yes [provider]  atorvastatin (LIPITOR) 80 MG tablet Take 80 mg by mouth daily.   Yes [provider]  benazepril (LOTENSIN) 10 MG tablet Take 20 mg by mouth daily.   Yes [provider]  carbamazepine (TEGRETOL XR) 400 MG 12 hr tablet  Take 1 tablet (400 mg total) by mouth 2 (two) times daily. 06/05/20  Yes Van ClinesAquino, [name] M, MD  fenofibrate 160 MG tablet Take 160 mg by mouth daily. 12/11/16  Yes [provider]  gabapentin (NEURONTIN) 300 MG capsule Take 3 caps in AM, 3 caps at noon, 4 caps at bedtime Patient taking differently: Take 900-1,200 mg by mouth 3 (three) times daily. Take 3 caps in AM, 3 caps at noon, 4 caps at bedtime 06/05/20  Yes Van ClinesAquino, [name] M, MD  insulin glargine (LANTUS) 100 UNIT/ML injection Inject 70 Units into the skin daily as needed (depending on sugar level).   Yes [provider]  JANUMET 50-1000 MG tablet Take 1 tablet by mouth 2 (two) times daily. 07/10/20  Yes [provider]  Omega-3 Fatty Acids (FISH OIL) 1000 MG CAPS Take 500 mg by mouth 2 (two) times daily.   Yes [provider]  omeprazole (PRILOSEC) 40 MG capsule Take 1 capsule (40 mg total) by mouth 2 (two) times daily. 03/21/19  Yes Tawni PummelWoodard, Janice B, PA-C  rosuvastatin (CRESTOR) 20 MG tablet Take 1 tablet (20 mg total) by mouth daily. 07/30/20  Yes [place],  Mikle Bosworth, MD  dextromethorphan-guaiFENesin [facility] DM) 30-600 MG 12hr tablet Take 1 tablet by mouth 2 (two) times daily. Patient not taking: Reported on 09/11/2020 11/08/19   Swayze, Ava, DO  food thickener (SIMPLYTHICK, HONEY/LEVEL 3/MODERATELY THICK,) LIQD Take 1 packet by mouth as needed. 07/29/20 11/26/20  Vassie Loll, MD    Scheduled Meds:  aspirin  300 mg Rectal Daily   budesonide (PULMICORT) nebulizer solution  0.5 mg Nebulization BID   carbamazepine  400 mg Oral BID   enoxaparin (LOVENOX) injection  40 mg Subcutaneous Q24H   escitalopram  10 mg Oral Daily   fenofibrate  54 mg Oral Daily   ipratropium-albuterol  3 mL Nebulization Q6H   methylPREDNISolone (SOLU-MEDROL) injection  60 mg Intravenous Q12H   rosuvastatin  20 mg Oral Daily   Continuous Infusions:  sodium chloride 75 mL/hr at 09/11/20 1618   azithromycin Stopped (09/11/20 1011)    piperacillin-tazobactam (ZOSYN)  IV 12.5 mL/hr at 09/11/20 1618   PRN Meds:.acetaminophen **OR** acetaminophen (TYLENOL) oral liquid 160 mg/5 mL **OR** acetaminophen, senna-docusate     Results for orders placed or performed during the hospital encounter of 09/11/20 (from the past 48 hour(s))  Resp Panel by RT-PCR (Flu A&B, Covid) Nasopharyngeal Swab     Status: None   Collection Time: 09/11/20  3:07 AM   Specimen: Nasopharyngeal Swab; Nasopharyngeal(NP) swabs in vial transport medium  Result Value Ref Range   SARS Coronavirus 2 by RT PCR NEGATIVE NEGATIVE    Comment: (NOTE) SARS-CoV-2 target nucleic acids are NOT DETECTED.  The SARS-CoV-2 RNA is generally detectable in upper respiratory specimens during the acute phase of infection. The lowest concentration of SARS-CoV-2 viral copies this assay can detect is 138 copies/mL. A negative result does not preclude SARS-Cov-2 infection and should not be used as the sole basis for treatment or other patient management decisions. A negative result may occur with  improper specimen collection/handling, submission of specimen other than nasopharyngeal swab, presence of viral mutation(s) within the areas targeted by this assay, and inadequate number of viral copies(<138 copies/mL). A negative result must be combined with clinical observations, patient history, and epidemiological information. The expected result is Negative.  Fact Sheet for Patients:  BloggerCourse.com  Fact Sheet for Healthcare Providers:  SeriousBroker.it  This test is no t yet approved or cleared by the [place] FDA and  has been authorized for detection and/or diagnosis of SARS-CoV-2 by FDA under an Emergency Use Authorization (EUA). This EUA will remain  in effect (meaning this test can be used) for the duration of the COVID-19 declaration under Section 564(b)(1) of the Act, 21 U.S.C.section 360bbb-3(b)(1),  unless the authorization is terminated  or revoked sooner.       Influenza A by PCR NEGATIVE NEGATIVE   Influenza B by PCR NEGATIVE NEGATIVE    Comment: (NOTE) The Xpert Xpress SARS-CoV-2/FLU/RSV plus assay is intended as an aid in the diagnosis of influenza from Nasopharyngeal swab specimens and should not be used as a sole basis for treatment. Nasal washings and aspirates are unacceptable for Xpert Xpress SARS-CoV-2/FLU/RSV testing.  Fact Sheet for Patients: BloggerCourse.com  Fact Sheet for Healthcare Providers: SeriousBroker.it  This test is not yet approved or cleared by the [place] FDA and has been authorized for detection and/or diagnosis of SARS-CoV-2 by FDA under an Emergency Use Authorization (EUA). This EUA will remain in effect (meaning this test can be used) for the duration of the COVID-19 declaration under Section 564(b)(1) of the Act, 21 U.S.C. section  360bbb-3(b)(1), unless the authorization is terminated or revoked.  Performed at [facility], [address] [place], [place] [address]   Urine rapid drug screen (hosp performed)     Status: Abnormal   Collection Time: 09/11/20  3:07 AM  Result Value Ref Range   Opiates NONE DETECTED NONE DETECTED   Cocaine POSITIVE (A) NONE DETECTED   Benzodiazepines NONE DETECTED NONE DETECTED   Amphetamines NONE DETECTED NONE DETECTED   Tetrahydrocannabinol NONE DETECTED NONE DETECTED   Barbiturates NONE DETECTED NONE DETECTED    Comment: (NOTE) DRUG SCREEN FOR MEDICAL PURPOSES ONLY.  IF CONFIRMATION IS NEEDED FOR ANY PURPOSE, NOTIFY LAB WITHIN 5 DAYS.  LOWEST DETECTABLE LIMITS FOR URINE DRUG SCREEN Drug Class                     Cutoff (ng/mL) Amphetamine and metabolites    1000 Barbiturate and metabolites    200 Benzodiazepine                 200 Tricyclics and metabolites     300 Opiates and metabolites        300 Cocaine and metabolites        300 THC                             50 Performed at [facility], [address] [place], [place] [address]   Ethanol     Status: None   Collection Time: 09/11/20  3:12 AM  Result Value Ref Range   Alcohol, Ethyl (B) <10 <10 mg/dL    Comment: (NOTE) Lowest detectable limit for serum alcohol is 10 mg/dL.  For medical purposes only. Performed at [facility], [address] [place], [place] [address]   Protime-INR     Status: None   Collection Time: 09/11/20  3:12 AM  Result Value Ref Range   Prothrombin Time 12.3 11.4 - 15.2 seconds   INR 0.9 0.8 - 1.2    Comment: (NOTE) INR goal varies based on device and disease states. Performed at [facility], [address], [place], [place] [address]   APTT     Status: None   Collection Time: 09/11/20  3:12 AM  Result Value Ref Range   aPTT 31 24 - 36 seconds    Comment: Performed at [facility], [address], [place], [place] [address]  CBC     Status: Abnormal   Collection Time: 09/11/20  3:12 AM  Result Value Ref Range   WBC 15.3 (H) 4.0 - 10.5 K/uL   RBC 4.06 (L) 4.22 - 5.81 MIL/uL   Hemoglobin 13.2 13.0 - 17.0 g/dL   HCT 62.8 36.6 - 29.4 %   MCV 98.5 80.0 - 100.0 fL   MCH 32.5 26.0 - 34.0 pg   MCHC 33.0 30.0 - 36.0 g/dL   RDW 76.5 46.5 - 03.5 %   Platelets 276 150 - 400 K/uL   nRBC 0.0 0.0 - 0.2 %    Comment: Performed at [facility], [address] [place], [place] [address]  Differential     Status: Abnormal   Collection Time: 09/11/20  3:12 AM  Result Value Ref Range   Neutrophils Relative % 80 %   Neutro Abs 12.3 (H) 1.7 - 7.7 K/uL   Lymphocytes Relative 11 %   Lymphs Abs 1.7 0.7 - 4.0 K/uL   Monocytes Relative 6 %   Monocytes Absolute 0.9 0.1 -  1.0 K/uL   Eosinophils Relative 2 %   Eosinophils Absolute 0.3 0.0 - 0.5 K/uL   Basophils Relative 0 %   Basophils Absolute 0.1 0.0 - 0.1 K/uL   Immature Granulocytes 1 %   Abs Immature Granulocytes 0.07 0.00 - 0.07 K/uL    Comment: Performed at [facility], [address],  [place], [place] [address]  Comprehensive metabolic panel     Status: Abnormal   Collection Time: 09/11/20  3:12 AM  Result Value Ref Range   Sodium 140 135 - 145 mmol/L   Potassium 3.8 3.5 - 5.1 mmol/L   Chloride 104 98 - 111 mmol/L   CO2 29 22 - 32 mmol/L   Glucose, Bld 135 (H) 70 - 99 mg/dL    Comment: Glucose reference range applies only to samples taken after fasting for at least 8 hours.   BUN 16 6 - 20 mg/dL   Creatinine, Ser 4.09 0.61 - 1.24 mg/dL   Calcium 9.3 8.9 - 81.1 mg/dL   Total Protein 7.0 6.5 - 8.1 g/dL   Albumin 3.7 3.5 - 5.0 g/dL   AST 12 (L) 15 - 41 U/L   ALT 11 0 - 44 U/L   Alkaline Phosphatase 70 38 - 126 U/L   Total Bilirubin 0.3 0.3 - 1.2 mg/dL   GFR, Estimated >91 >47 mL/min    Comment: (NOTE) Calculated using the CKD-EPI Creatinine Equation (2021)    Anion gap 7 5 - 15    Comment: Performed at [facility], [address], [place], [place] [address]  Procalcitonin - Baseline     Status: None   Collection Time: 09/11/20  3:12 AM  Result Value Ref Range   Procalcitonin <0.10 ng/mL    Comment:        Interpretation: PCT (Procalcitonin) <= 0.5 ng/mL: Systemic infection (sepsis) is not likely. Local bacterial infection is possible. (NOTE)       Sepsis PCT Algorithm           Lower Respiratory Tract                                      Infection PCT Algorithm    ----------------------------     ----------------------------         PCT < 0.25 ng/mL                PCT < 0.10 ng/mL          Strongly encourage             Strongly discourage   discontinuation of antibiotics    initiation of antibiotics    ----------------------------     -----------------------------       PCT 0.25 - 0.50 ng/mL            PCT 0.10 - 0.25 ng/mL               OR       >80% decrease in PCT            Discourage initiation of                                            antibiotics      Encourage discontinuation           of  antibiotics    ----------------------------      -----------------------------         PCT >= 0.50 ng/mL              PCT 0.26 - 0.50 ng/mL               AND        <80% decrease in PCT             Encourage initiation of                                             antibiotics       Encourage continuation           of antibiotics    ----------------------------     -----------------------------        PCT >= 0.50 ng/mL                  PCT > 0.50 ng/mL               AND         increase in PCT                  Strongly encourage                                      initiation of antibiotics    Strongly encourage escalation           of antibiotics                                     -----------------------------                                           PCT <= 0.25 ng/mL                                                 OR                                        > 80% decrease in PCT                                      Discontinue / Do not initiate                                             antibiotics  Performed at [facility], [address], [place], [place] [address]   CBG monitoring, ED     Status: Abnormal   Collection Time: 09/11/20  3:46 AM  Result Value Ref Range   Glucose-Capillary 119 (H) 70 - 99 mg/dL  Comment: Glucose reference range applies only to samples taken after fasting for at least 8 hours.  Lactic acid, plasma     Status: None   Collection Time: 09/11/20  8:55 AM  Result Value Ref Range   Lactic Acid, Venous 1.2 0.5 - 1.9 mmol/L    Comment: Performed at [facility], [address], [place], [place] [address]  Urinalysis, Routine w reflex microscopic     Status: Abnormal   Collection Time: 09/11/20 11:10 AM  Result Value Ref Range   Color, Urine YELLOW YELLOW   APPearance CLEAR CLEAR   Specific Gravity, Urine >1.046 (H) 1.005 - 1.030   pH 5.0 5.0 - 8.0   Glucose, UA NEGATIVE NEGATIVE mg/dL   Hgb urine dipstick NEGATIVE NEGATIVE   Bilirubin Urine NEGATIVE NEGATIVE   Ketones, ur NEGATIVE NEGATIVE  mg/dL   Protein, ur 30 (A) NEGATIVE mg/dL   Nitrite NEGATIVE NEGATIVE   Leukocytes,Ua NEGATIVE NEGATIVE   RBC / HPF 0-5 0 - 5 RBC/hpf   WBC, UA 0-5 0 - 5 WBC/hpf   Bacteria, UA NONE SEEN NONE SEEN   Squamous Epithelial / LPF 0-5 0 - 5   Mucus PRESENT     Comment: Performed at [facility], [address], [place], [place] [address]  MRSA Next Gen by PCR, Nasal     Status: None   Collection Time: 09/11/20 11:15 AM   Specimen: Nasal Mucosa; Nasal Swab  Result Value Ref Range   MRSA by PCR Next Gen NOT DETECTED NOT DETECTED    Comment: (NOTE) The GeneXpert MRSA Assay (FDA approved for NASAL specimens only), is one component of a comprehensive MRSA colonization surveillance program. It is not intended to diagnose MRSA infection nor to guide or monitor treatment for MRSA infections. Test performance is not FDA approved in patients less than 38 years old. Performed at [facility], [address] [place], [place] [address]     Studies/Results:    IMPRESSION: MRI brain:   1. Prematurely terminated and significantly motion degraded examination, as described and limiting evaluation. 2. Two adjacent acute infarcts within the right cingulate gyrus/callosal body (right ACA vascular territory). 3. Restricted diffusion within the posterior limb of right internal capsule, increased in extent as compared to the brain MRI of 07/25/2020. This may reflect persistent restricted diffusion at site of a known subacute infarct, or an acute on subacute infarct. 4. 6 mm acute infarct within the right parietal lobe subcortical white matter. 5. Questionable punctate acute infarct within the left frontal lobe subcortical white matter. 6. Given infarcts involving multiple vascular territories, consider an embolic process. 7. Known chronic cortical/subcortical infarcts within the high right frontal lobe and left occipital temporal lobes. 8. Incompletely assessed severe chronic small vessel  ischemic changes within the cerebral white matter. 9. Known chronic right basal ganglia lacunar infarct. 10. Mild generalized parenchymal atrophy.   MRA head:   1. Significantly motion degraded and limited examination, as described. 2. No proximal large vessel occlusion identified within the intracranial internal carotid arteries, M1 middle cerebral arteries, proximal anterior cerebral arteries, intracranial vertebral arteries, basilar artery or proximal posterior cerebral arteries. The examination is non-diagnostic more distally. 3. 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       HEAD NECK CTA IMPRESSION: CT HEAD IMPRESSION:   1. No acute intracranial abnormality. 2. Aspects = 10. 3. Age-related cerebral atrophy with advanced chronic microvascular ischemic disease, with multiple remote lacunar infarcts about  the bilateral basal ganglia and thalami, with additional chronic right frontal infarct.   CTA HEAD AND NECK IMPRESSION:   1. Negative CTA for emergent large vessel occlusion. 2. Atheromatous change about the carotid bifurcations with associated stenosis of up to 55% on the left and 35% on the right. 3. Additional mild atheromatous change elsewhere about the major arterial vasculature of the head and neck as above. No other proximal high-grade or correctable stenosis. 4. Mild scattered patchy and nodular densities within the partially visualized left upper lobe, suspicious for acute infection/pneumonia. 5.  Emphysema (ICD10-J43.9).   CT PERFUSION IMPRESSION:   1. No evidence for acute core infarct by CT perfusion. 2. 7 cc area of delayed perfusion within the posterior right temporoccipital region, which could reflect an area of all vein ischemia. Correlation with dedicated brain MRI suggested as warranted.      The brain MRI is reviewed in person and shows evidence of frontal infarct  medially on the right side with increased signal on DWI and reduced signal on ADC scan. There are 3 areas in this location there is also increase signal involving the posterior frontal lateral region. The prior area of infarct involving the posterior limb of the internal capsule has reduced and the likely reflects normal change with time.     Capucine Tryon A. Gerilyn [name], M.D.  Diplomate, Biomedical engineer of Psychiatry and Neurology ( Neurology). 09/11/2020, 6:15 PM

## 2020-09-11 NOTE — ED Notes (Signed)
Pt stating he cannot move his left arm and left leg, has left sided facial droop. States that this is a new onset. Dr. Manus Gunning made aware

## 2020-09-12 ENCOUNTER — Inpatient Hospital Stay (HOSPITAL_COMMUNITY): Payer: Medicaid Other

## 2020-09-12 ENCOUNTER — Other Ambulatory Visit (HOSPITAL_COMMUNITY): Payer: Self-pay | Admitting: *Deleted

## 2020-09-12 DIAGNOSIS — Z7189 Other specified counseling: Secondary | ICD-10-CM

## 2020-09-12 DIAGNOSIS — I639 Cerebral infarction, unspecified: Secondary | ICD-10-CM | POA: Diagnosis not present

## 2020-09-12 DIAGNOSIS — Z515 Encounter for palliative care: Secondary | ICD-10-CM

## 2020-09-12 DIAGNOSIS — J9621 Acute and chronic respiratory failure with hypoxia: Secondary | ICD-10-CM | POA: Diagnosis not present

## 2020-09-12 DIAGNOSIS — J690 Pneumonitis due to inhalation of food and vomit: Secondary | ICD-10-CM | POA: Diagnosis not present

## 2020-09-12 DIAGNOSIS — F1410 Cocaine abuse, uncomplicated: Secondary | ICD-10-CM | POA: Diagnosis not present

## 2020-09-12 DIAGNOSIS — J441 Chronic obstructive pulmonary disease with (acute) exacerbation: Secondary | ICD-10-CM | POA: Diagnosis not present

## 2020-09-12 LAB — LIPID PANEL
Cholesterol: 139 mg/dL (ref 0–200)
HDL: 33 mg/dL — ABNORMAL LOW (ref >40)
LDL Cholesterol: 56 mg/dL (ref 0–99)
Total CHOL/HDL Ratio: 4.2 RATIO
Triglycerides: 249 mg/dL — ABNORMAL HIGH (ref <150)
VLDL: 50 mg/dL — ABNORMAL HIGH (ref 0–40)

## 2020-09-12 LAB — CBC
HCT: 42.8 % (ref 39.0–52.0)
Hemoglobin: 14.3 g/dL (ref 13.0–17.0)
MCH: 32.6 pg (ref 26.0–34.0)
MCHC: 33.4 g/dL (ref 30.0–36.0)
MCV: 97.5 fL (ref 80.0–100.0)
Platelets: 323 10*3/uL (ref 150–400)
RBC: 4.39 MIL/uL (ref 4.22–5.81)
RDW: 13.2 % (ref 11.5–15.5)
WBC: 18.7 10*3/uL — ABNORMAL HIGH (ref 4.0–10.5)
nRBC: 0 % (ref 0.0–0.2)

## 2020-09-12 LAB — COMPREHENSIVE METABOLIC PANEL
ALT: 12 U/L (ref 0–44)
AST: 13 U/L — ABNORMAL LOW (ref 15–41)
Albumin: 3.8 g/dL (ref 3.5–5.0)
Alkaline Phosphatase: 78 U/L (ref 38–126)
Anion gap: 9 (ref 5–15)
BUN: 20 mg/dL (ref 6–20)
CO2: 27 mmol/L (ref 22–32)
Calcium: 9.2 mg/dL (ref 8.9–10.3)
Chloride: 103 mmol/L (ref 98–111)
Creatinine, Ser: 0.9 mg/dL (ref 0.61–1.24)
GFR, Estimated: >60 mL/min (ref >60)
Glucose, Bld: 197 mg/dL — ABNORMAL HIGH (ref 70–99)
Potassium: 4.1 mmol/L (ref 3.5–5.1)
Sodium: 139 mmol/L (ref 135–145)
Total Bilirubin: 0.6 mg/dL (ref 0.3–1.2)
Total Protein: 7.5 g/dL (ref 6.5–8.1)

## 2020-09-12 LAB — PROCALCITONIN: Procalcitonin: <0.1 ng/mL

## 2020-09-12 IMAGING — CT CT ABDOMEN W/O CM
2 of 4 series · 16 of 46 positions shown, 18 images · non-contrast
Comparison: [DATE]

CLINICAL DATA: Dysphagia, preop planning for gastrostomy placement

EXAM:
CT ABDOMEN WITHOUT CONTRAST
TECHNIQUE: Multidetector CT imaging of the abdomen was performed following the
standard protocol without IV contrast.

[Series 2: axial st · axial · 0.93mm/px · z∈[+1028,+1293]mm · 13 of 61 slices shown, 15 images]
[im 4/61  soft-tissue]
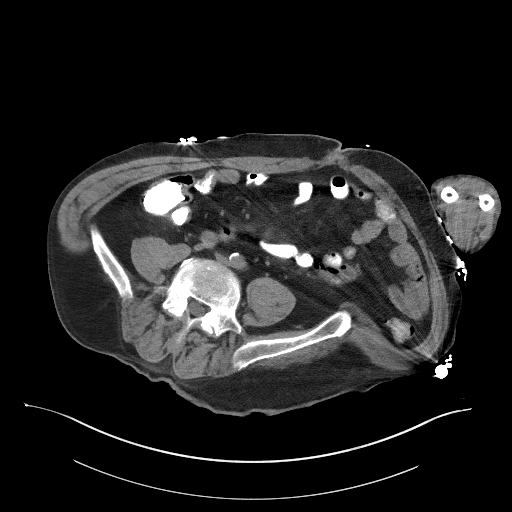
[im 4/61  bone]
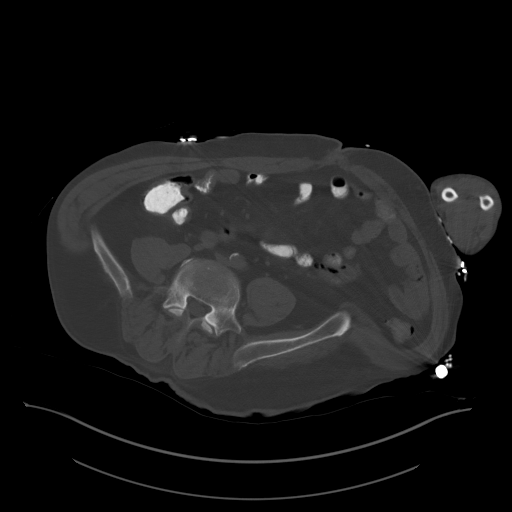
[im 8/61  soft-tissue]
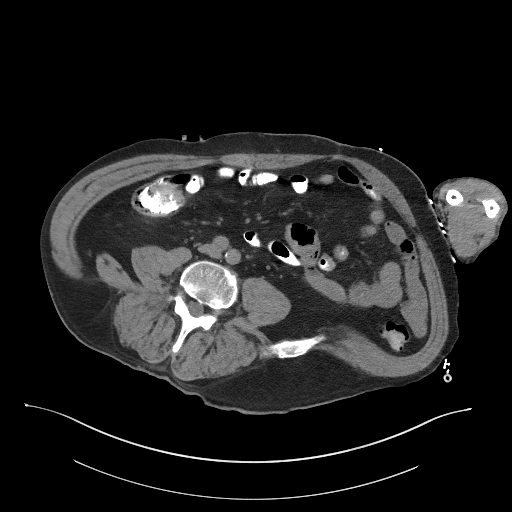
[im 12/61  soft-tissue]
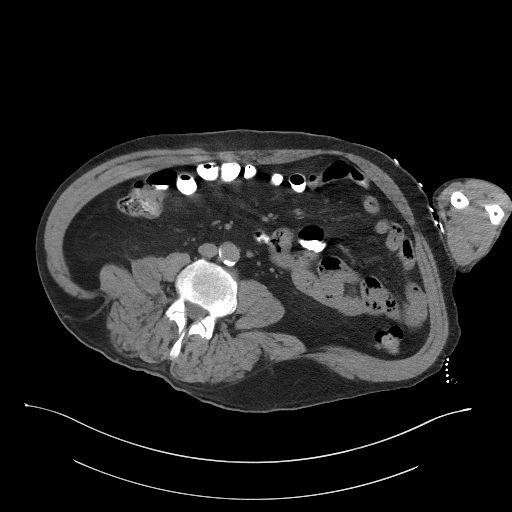
[im 19/61  soft-tissue]
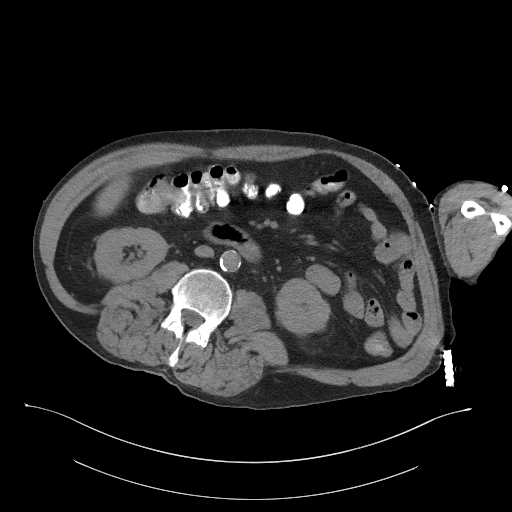
[im 23/61  soft-tissue]
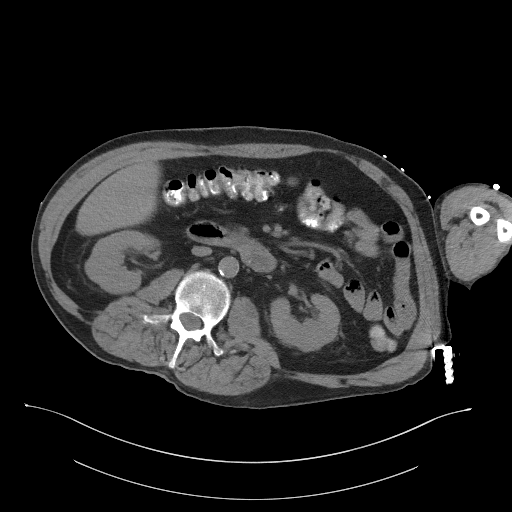
[im 27/61  soft-tissue]
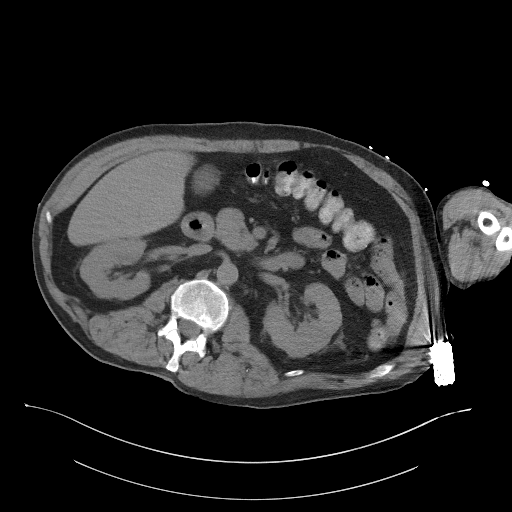
[im 31/61  soft-tissue]
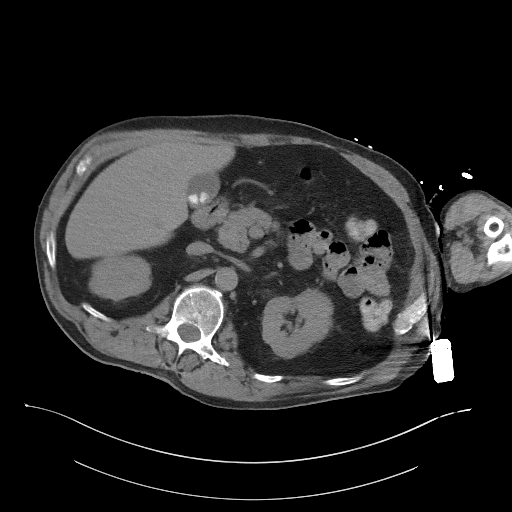
[im 34/61  soft-tissue]
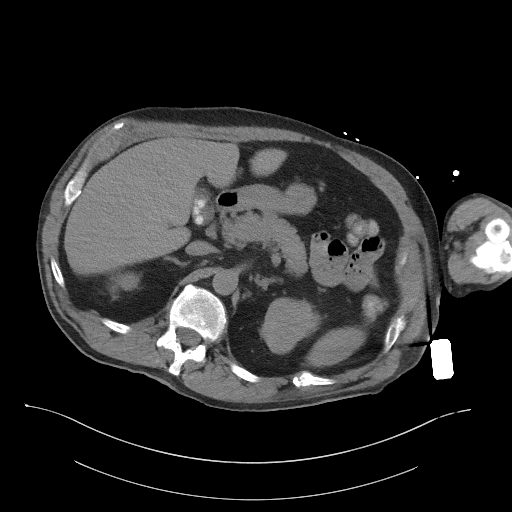
[im 38/61  soft-tissue]
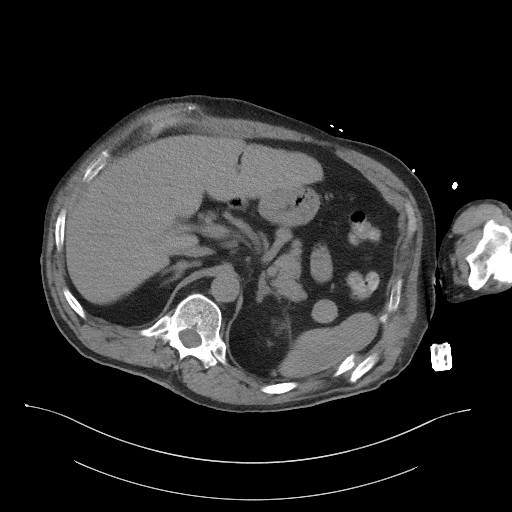
[im 38/61  bone]
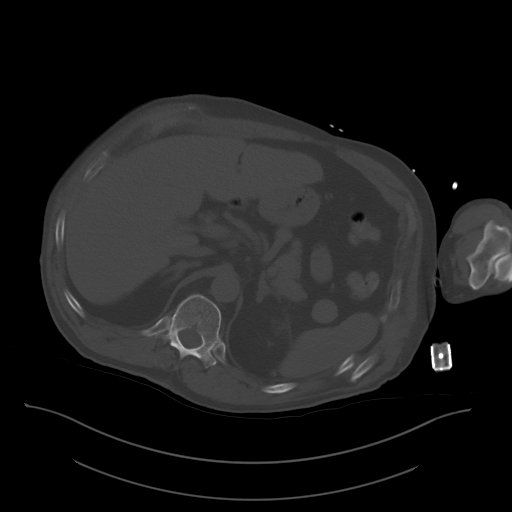
[im 42/61  soft-tissue]
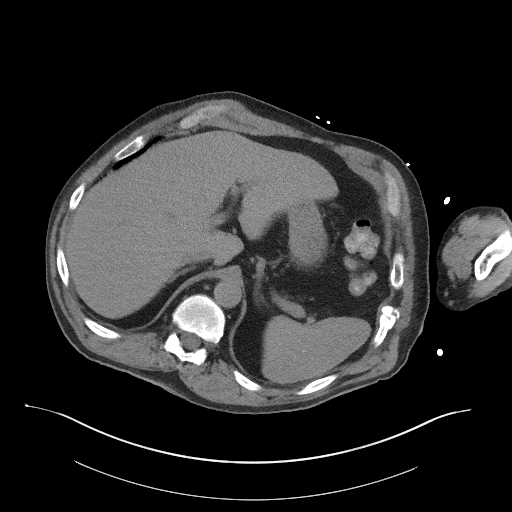
[im 49/61  soft-tissue]
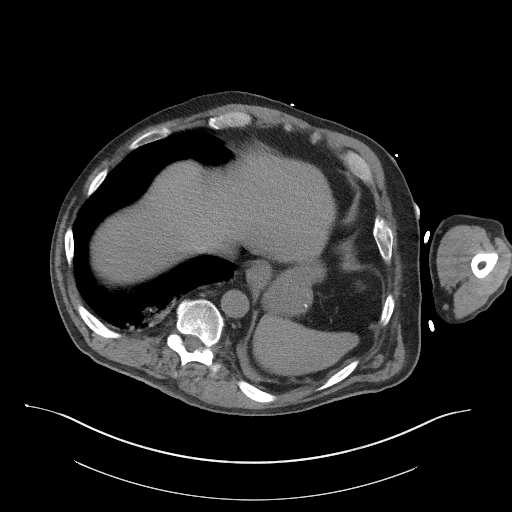
[im 53/61  soft-tissue]
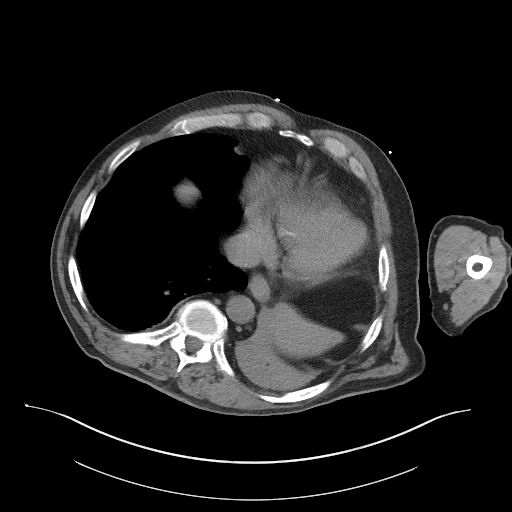
[im 57/61  soft-tissue]
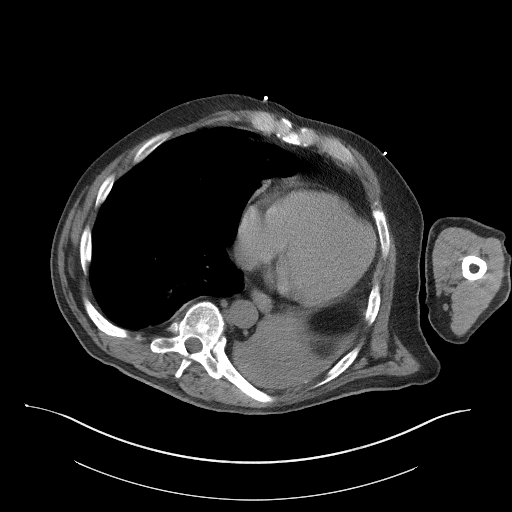

[Series 5: coronal st · coronal · 0.67mm/px · 3 of 117 slices shown]
[im 39/117  soft-tissue]
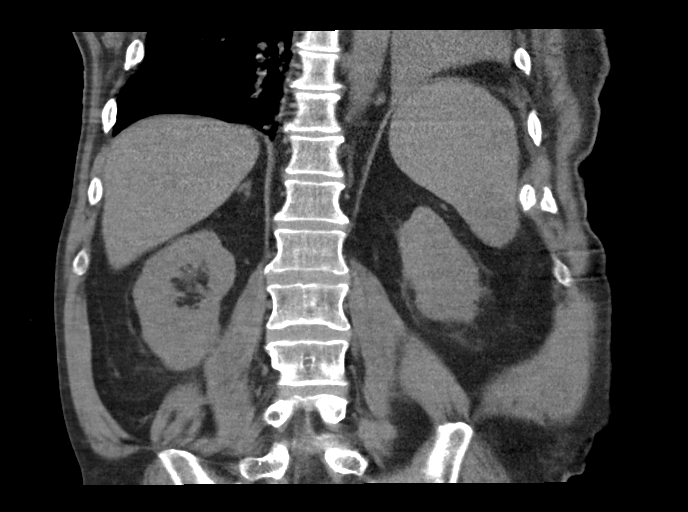
[im 52/117  soft-tissue]
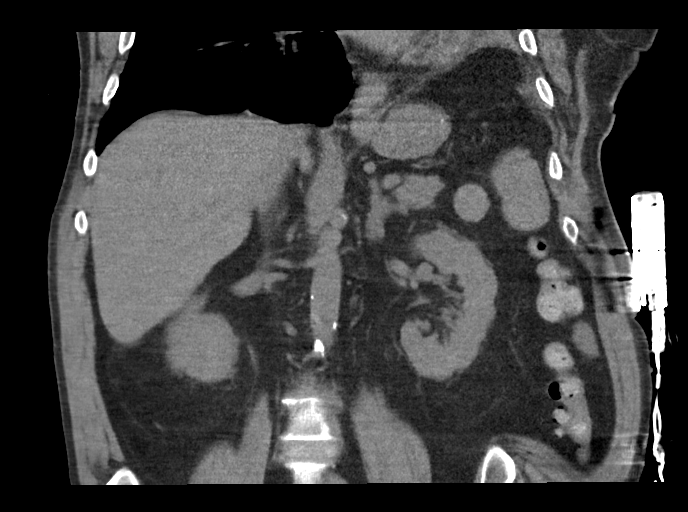
[im 65/117  soft-tissue]
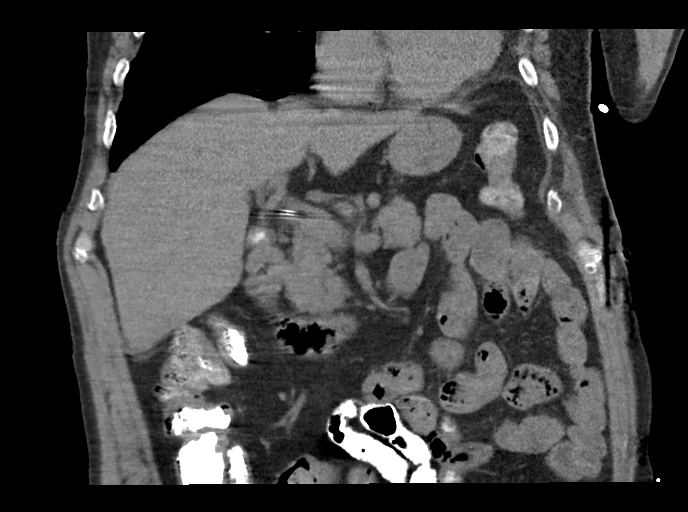

[16 of 46 positions shown; findings below may reference images not displayed]

FINDINGS: Lower chest: Dense consolidation in the visualized left lower lung.
Patchy somewhat nodular airspace opacities in the right lower lung,
new since previous.

Hepatobiliary: Partially calcified gallstones measuring up to 10 mm
in the nondilated gallbladder. No evident liver lesion or biliary
ductal dilatation.

Pancreas: Unremarkable. No pancreatic ductal dilatation or
surrounding inflammatory changes.

Spleen: Normal in size without focal abnormality.

Adrenals/Urinary Tract: Adrenal glands are unremarkable. Kidneys are
normal, without renal calculi, focal lesion, or hydronephrosis.

Stomach/Bowel: The stomach is incompletely distended. Metallic clip
near the duodenal bulb. Visualized portions of small bowel and colon
are nondilated, unremarkable.

Vascular/Lymphatic: Scattered calcified aortoiliac plaque without
aneurysm.

Other: No ascites.  No free air.

Musculoskeletal: Left tenth and eleventh rib fracture deformities.
No acute finding.
IMPRESSION: 1. There is safe percutaneous window for gastrostomy placement.
2. Left lower lung consolidation. Patchy nodular densities in the
right lung base possibly infectious/inflammatory.
3. Cholelithiasis
4.  Aortic Atherosclerosis ([5Y]-170.0).

## 2020-09-12 MED ORDER — IPRATROPIUM-ALBUTEROL 0.5-2.5 (3) MG/3ML IN SOLN
3.0000 mL | RESPIRATORY_TRACT | Status: DC | PRN
  Administered 2020-09-12 – 2020-09-23 (×3): 3 mL via RESPIRATORY_TRACT
  Filled 2020-09-12 (×3): qty 3

## 2020-09-12 MED ORDER — NICOTINE 7 MG/24HR TD PT24
7.0000 mg | MEDICATED_PATCH | Freq: Every day | TRANSDERMAL | Status: DC
  Administered 2020-09-12 – 2020-09-19 (×8): 7 mg via TRANSDERMAL
  Filled 2020-09-12 (×9): qty 1

## 2020-09-12 MED ORDER — CHLORHEXIDINE GLUCONATE CLOTH 2 % EX PADS
6.0000 | MEDICATED_PAD | Freq: Every day | CUTANEOUS | Status: DC
  Administered 2020-09-13 – 2020-09-15 (×3): 6 via TOPICAL

## 2020-09-12 NOTE — Progress Notes (Signed)
   IR aware of G tube request CT Abd w/o Cx ordered to evaluate anatomy prior to full work up.  Will await results of CT

## 2020-09-12 NOTE — Progress Notes (Addendum)
Inpatient Rehabilitation Admissions Coordinator  Inpatient rehab consult received for possible CIR/inpt rehab admit at [facility]. I will contact patient/family when appropriate to begin assessment and discussions concerning rehab venue options. We are now contracted with [employer] plan. Noted on 15 L HF [place] at this time. I will follow up tomorrow.  Ottie Glazier, RN, MSN Rehab Admissions Coordinator [phone] 09/12/2020 10:27 AM

## 2020-09-12 NOTE — Progress Notes (Signed)
PROGRESS NOTE  Brent [name] ZOX:[id number] DOB: November 14, 1965 DOA: 09/11/2020 PCP: Beatrix Fetters, MD  Brief History:  55 y.o. male with medical history of hypertension, hyperlipidemia, diabetes mellitus type 2, cocaine abuse, tobacco abuse, recent stroke presented with 2 to 3-day history of slurred speech, left hemiparesis, and shortness of breath.  The patient is a poor historian.  History is taken from review of the medical record and speaking with the patient's significant other.  Patient has been having worsening shortness of breath and coughing for the past 2 to 3 days.  There is no fevers, chills, hemoptysis, nausea, vomiting, diarrhea, chest pain.  He continues to smoke cigarettes.  He last smoked cocaine on 09/09/2020.  He denies any headache, visual disturbance.  He states that his left hemiparesis is about the same as usual.  There is no abdominal pain, dysuria, hematuria. In the emergency department, the patient was afebrile.  He was tachycardic up to 105.  Oxygen saturation was 90-92% on 3 L.  CXR showed bibasilar opacities.  He was started on zosyn for aspiration pneumonia.  Neurology was consulted to assist with management.    Assessment/Plan: Acute respiratory failure with hypoxia -due to aspiration pneumonia and COPD exacerbation -Currently on 10 L >>15L HFNC   Aspiration pneumonia -Start IV Zosyn -Speech therapy evaluation--dys 1 with honey thickened liquid -patient is very impulse and has difficulty following instructions regarding meals/eating -7/27 evening--had another aspirational event due to impulsivness   COPD exacerbation -Continue Pulmicort -Continue duo nebs -Continue  IV Solu-Medrol   Acute Ischemic/Embolic Stroke -presented with Left Hemiparesis/Dysarthria -CTA head and neck--negative for LVO; 55% left carotid, 35% right carotid stenosis -MRI brain--2 acute infarcts R-cingulate gyrus; acute/subacute infart R-internal capsule and R-parietal  lobe -CT perfusion brain--no acute core infarct, 7 cc area of delayed perfusion in the right posterior temporal occipital lobe -Continue aspirin and Plavix -PT eval -LDL 56 -07/26/20 A1C--7.0 -appreciate neurology consult>>continue DAPT   Sepsis -due to pneumonia -present on admission -start IVF>>saline locked -check lactic--1.2 -check PCT <0.10 -Continue IV zosyn   Polysubstance abuse -Including cocaine and tobacco -Last used cocaine 09/09/2020 -Cessation discussed   Diabetes mellitus type 2 -NovoLog sliding scale -Reduced dose Lantus -holding Janumet -07/26/20 A1c--7.0   Essential hypertension -Allow permissive hypertension at this time -holding benazepril, metoprolol temporarily   Hyperlipidemia -Continue Crestor and fenofibrate   Chronic back pain -Continue gabapentin   Goals of Care -discussed with daughter and significant other-->confirm full code -Advance care planning, including the explanation and discussion of advance directives was carried out with the patient and family.  Code status including explanations of "Full Code" and "DNR" and alternatives were discussed in detail.  Discussion of end-of-life issues including but not limited palliative care, hospice care and the concept of hospice, other end-of-life care options, power of attorney for health care decisions, living wills, and physician orders for life-sustaining treatment were also discussed with the patient and family.  Total face to face time 20 minutes.   Total time spent 40 minutes.  Greater than 50% spent face to face counseling and coordinating care.    Status is: Inpatient  Remains inpatient appropriate because:Inpatient level of care appropriate due to severity of illness  Dispo: The patient is from: Home              Anticipated d/c is to: Home              Patient currently is  not medically stable to d/c.   Difficult to place patient Yes        Family Communication:   significant  other and daughter updated 7/28  Consultants:  palliative med  Code Status:  FULL   DVT Prophylaxis:   [place]ay Lovenox   Procedures: As Listed in Progress Note Above  Antibiotics: Zosyn 7/27>> Azithro 7/27>>      Subjective: Patient complains of sob.  Denies f/c, headache, n/v/d, cp, abd pain  Objective: Vitals:   09/12/20 0436 09/12/20 0530 09/12/20 0600 09/12/20 0757  BP:   (!) 148/87   Pulse:  (!) 106 (!) 106   Resp:  (!) 25 (!) 23   Temp:      TempSrc:      SpO2: (!) 85% 92% 90% 93%  Weight:      Height:        Intake/Output Summary (Last 24 hours) at 09/12/2020 1030 Last data filed at 09/12/2020 0424 Gross per 24 hour  Intake 517.24 ml  Output 2600 ml  Net -2082.76 ml   Weight change: 2.1 kg Exam:  General:  Pt is alert, follows commands appropriately, not in acute distress HEENT: No icterus, No thrush, No neck mass, /AT Cardiovascular: RRR, S1/S2, no rubs, no gallops Respiratory: bilateral rales.  Bibasilar exp wheeze Abdomen: Soft/+BS, non tender, non distended, no guarding Extremities: No edema, No lymphangitis, No petechiae, No rashes, no synovitis   Data Reviewed: I have personally reviewed following labs and imaging studies Basic Metabolic Panel: Recent Labs  Lab 09/11/20 0312  NA 140  K 3.8  CL 104  CO2 29  GLUCOSE 135*  BUN 16  CREATININE 0.87  CALCIUM 9.3   Liver Function Tests: Recent Labs  Lab 09/11/20 0312  AST 12*  ALT 11  ALKPHOS 70  BILITOT 0.3  PROT 7.0  ALBUMIN 3.7   No results for input(s): LIPASE, AMYLASE in the last 168 hours. No results for input(s): AMMONIA in the last 168 hours. Coagulation Profile: Recent Labs  Lab 09/11/20 0312  INR 0.9   CBC: Recent Labs  Lab 09/11/20 0312 09/12/20 1020  WBC 15.3* 18.7*  NEUTROABS 12.3*  --   HGB 13.2 14.3  HCT 40.0 42.8  MCV 98.5 97.5  PLT 276 323   Cardiac Enzymes: No results for input(s): CKTOTAL, CKMB, CKMBINDEX, TROPONINI in the last 168  hours. BNP: Invalid input(s): POCBNP CBG: Recent Labs  Lab 09/11/20 0346  GLUCAP 119*   HbA1C: No results for input(s): HGBA1C in the last 72 hours. Urine analysis:    Component Value Date/Time   COLORURINE YELLOW 09/11/2020 1110   APPEARANCEUR CLEAR 09/11/2020 1110   LABSPEC >1.046 (H) 09/11/2020 1110   PHURINE 5.0 09/11/2020 1110   GLUCOSEU NEGATIVE 09/11/2020 1110   HGBUR NEGATIVE 09/11/2020 1110   BILIRUBINUR NEGATIVE 09/11/2020 1110   KETONESUR NEGATIVE 09/11/2020 1110   PROTEINUR 30 (A) 09/11/2020 1110   NITRITE NEGATIVE 09/11/2020 1110   LEUKOCYTESUR NEGATIVE 09/11/2020 1110   Sepsis Labs: (procalcitonin:4,lacticidven:4) ) Recent Results (from the past 240 hour(s))  Resp Panel by RT-PCR (Flu A&B, Covid) Nasopharyngeal Swab     Status: None   Collection Time: 09/11/20  3:07 AM   Specimen: Nasopharyngeal Swab; Nasopharyngeal(NP) swabs in vial transport medium  Result Value Ref Range Status   SARS Coronavirus 2 by RT PCR NEGATIVE NEGATIVE Final    Comment: (NOTE) SARS-CoV-2 target nucleic acids are NOT DETECTED.  The SARS-CoV-2 RNA is generally detectable in upper respiratory specimens during the  acute phase of infection. The lowest concentration of SARS-CoV-2 viral copies this assay can detect is 138 copies/mL. A negative result does not preclude SARS-Cov-2 infection and should not be used as the sole basis for treatment or other patient management decisions. A negative result may occur with  improper specimen collection/handling, submission of specimen other than nasopharyngeal swab, presence of viral mutation(s) within the areas targeted by this assay, and inadequate number of viral copies(<138 copies/mL). A negative result must be combined with clinical observations, patient history, and epidemiological information. The expected result is Negative.  Fact Sheet for Patients:  BloggerCourse.comhttps://www.fda.gov/media/152166/download  Fact Sheet for Healthcare  Providers:  SeriousBroker.ithttps://www.fda.gov/media/152162/download  This test is no t yet approved or cleared by the [place] FDA and  has been authorized for detection and/or diagnosis of SARS-CoV-2 by FDA under an Emergency Use Authorization (EUA). This EUA will remain  in effect (meaning this test can be used) for the duration of the COVID-19 declaration under Section 564(b)(1) of the Act, 21 U.S.C.section 360bbb-3(b)(1), unless the authorization is terminated  or revoked sooner.       Influenza A by PCR NEGATIVE NEGATIVE Final   Influenza B by PCR NEGATIVE NEGATIVE Final    Comment: (NOTE) The Xpert Xpress SARS-CoV-2/FLU/RSV plus assay is intended as an aid in the diagnosis of influenza from Nasopharyngeal swab specimens and should not be used as a sole basis for treatment. Nasal washings and aspirates are unacceptable for Xpert Xpress SARS-CoV-2/FLU/RSV testing.  Fact Sheet for Patients: BloggerCourse.comhttps://www.fda.gov/media/152166/download  Fact Sheet for Healthcare Providers: SeriousBroker.ithttps://www.fda.gov/media/152162/download  This test is not yet approved or cleared by the [place] FDA and has been authorized for detection and/or diagnosis of SARS-CoV-2 by FDA under an Emergency Use Authorization (EUA). This EUA will remain in effect (meaning this test can be used) for the duration of the COVID-19 declaration under Section 564(b)(1) of the Act, 21 U.S.C. section 360bbb-3(b)(1), unless the authorization is terminated or revoked.  Performed at [facility], [address] [place], [place] [address]   MRSA Next Gen by PCR, Nasal     Status: None   Collection Time: 09/11/20 11:15 AM   Specimen: Nasal Mucosa; Nasal Swab  Result Value Ref Range Status   MRSA by PCR Next Gen NOT DETECTED NOT DETECTED Final    Comment: (NOTE) The GeneXpert MRSA Assay (FDA approved for NASAL specimens only), is one component of a comprehensive MRSA colonization surveillance program. It is not intended to  diagnose MRSA infection nor to guide or monitor treatment for MRSA infections. Test performance is not FDA approved in patients less than 56 years old. Performed at [facility], [address] [place], [place] [address]      Scheduled Meds:  aspirin  300 mg Rectal Daily   budesonide (PULMICORT) nebulizer solution  0.5 mg Nebulization BID   carbamazepine  400 mg Oral BID   enoxaparin (LOVENOX) injection  40 mg Subcutaneous Q24H   escitalopram  10 mg Oral Daily   fenofibrate  54 mg Oral Daily   ipratropium-albuterol  3 mL Nebulization Q6H   methylPREDNISolone (SOLU-MEDROL) injection  60 mg Intravenous Q12H   rosuvastatin  20 mg Oral Daily   Continuous Infusions:  sodium chloride     azithromycin 500 mg (09/12/20 1016)   piperacillin-tazobactam (ZOSYN)  IV 3.375 g (09/12/20 0937)    Procedures/Studies: MR ANGIO HEAD WO CONTRAST  Result Date: 09/11/2020 CLINICAL DATA:  Neuro deficit, acute, stroke suspected. Additional history provided by technologist: Altered mental status. EXAM:  MRI HEAD WITHOUT CONTRAST MRA HEAD WITHOUT CONTRAST TECHNIQUE: Multiplanar, multi-echo pulse sequences of the brain and surrounding structures were acquired without intravenous contrast. Angiographic images of the Circle of Willis were acquired using MRA technique without intravenous contrast. COMPARISON:  Noncontrast head CT and CT angiogram head/neck 09/11/2020. MRI brain and MRA head 07/25/2020. FINDINGS: MRI HEAD FINDINGS Brain: The examination was prematurely terminated due to the patient's altered mental status and excessive motion degradation. Only axial and coronal diffusion-weighted imaging, a sagittal T1 weighted sequence, and an axial T2 TSE sequence were obtained. The acquired sequences are motion degraded. Most notably, there is severe motion degradation of the sagittal T1 weighted sequence and axial T2 TSE sequence. Mild generalized parenchymal atrophy. Two adjacent acute infarcts within the cingulate  gyrus/callosal body measuring up to 8 mm. Restricted diffusion within the posterior limb of right internal capsule, increased in extent as compared to the brain MRI of 07/25/2020. This may reflect persistent restricted diffusion at site of a subacute infarction, or an acute on subacute infarct. 6 mm acute infarct within the right parietal lobe subcortical white matter (series 5, image 20). Questionable punctate acute infarct within the left frontal lobe subcortical white matter (series 5, image 21). Known chronic cortical/subcortical infarcts within the high right frontal lobe and left temporal occipital lobes. Incompletely assessed severe chronic small vessel ischemic changes within the cerebral white matter. Known chronic right basal ganglia lacunar infarct. Within described limitations, no intracranial mass or extra-axial fluid collection is identified. No midline shift. Vascular: Flow voids poorly assessed due to the degree of motion degradation. Skull and upper cervical spine: Within described limitations, no focal suspicious marrow lesion is identified. Sinuses/Orbits: No acute orbital abnormality is identified. Mild mucosal thickening within the left maxillary sinus. MRA HEAD FINDINGS The examination is significantly motion degraded, precluding adequate evaluation for intracranial arterial stenoses. No proximal arterial occlusion is identified within the intracranial internal carotid arteries, M1 middle cerebral arteries, proximal anterior cerebral arteries, intracranial vertebral arteries, basilar artery or proximal posterior cerebral arteries. The examination is the nondiagnostic more distally. 2 mm inferiorly projecting vascular protrusion arising from the cavernous left ICA, likely reflecting an aneurysm (series 1038, image 18). IMPRESSION: MRI brain: 1. Prematurely terminated and significantly motion degraded examination, as described and limiting evaluation. 2. Two adjacent acute infarcts within the  right cingulate gyrus/callosal body (right ACA vascular territory). 3. Restricted diffusion within the posterior limb of right internal capsule, increased in extent as compared to the brain MRI of 07/25/2020. This may reflect persistent restricted diffusion at site of a known subacute infarct, or an acute on subacute infarct. 4. 6 mm acute infarct within the right parietal lobe subcortical white matter. 5. Questionable punctate acute infarct within the left frontal lobe subcortical white matter. 6. Given infarcts involving multiple vascular territories, consider an embolic process. 7. Known chronic cortical/subcortical infarcts within the high right frontal lobe and left occipital temporal lobes. 8. Incompletely assessed severe chronic small vessel ischemic changes within the cerebral white matter. 9. Known chronic right basal ganglia lacunar infarct. 10. Mild generalized parenchymal atrophy. MRA head: 1. Significantly motion degraded and limited examination, as described. 2. No proximal large vessel occlusion identified within the intracranial internal carotid arteries, M1 middle cerebral arteries, proximal anterior cerebral arteries, intracranial vertebral arteries, basilar artery or proximal posterior cerebral arteries. The examination is non-diagnostic more distally. 3. 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 Electronically Signed   By: Jackey Loge  DO   On: 09/11/2020 11:27   MR BRAIN WO CONTRAST  Result Date: 09/11/2020 CLINICAL DATA:  Neuro deficit, acute, stroke suspected. Additional history provided by technologist: Altered mental status. EXAM: MRI HEAD WITHOUT CONTRAST MRA HEAD WITHOUT CONTRAST TECHNIQUE: Multiplanar, multi-echo pulse sequences of the brain and surrounding structures were acquired without intravenous contrast. Angiographic images of the Circle of Willis were acquired using MRA technique without intravenous  contrast. COMPARISON:  Noncontrast head CT and CT angiogram head/neck 09/11/2020. MRI brain and MRA head 07/25/2020. FINDINGS: MRI HEAD FINDINGS Brain: The examination was prematurely terminated due to the patient's altered mental status and excessive motion degradation. Only axial and coronal diffusion-weighted imaging, a sagittal T1 weighted sequence, and an axial T2 TSE sequence were obtained. The acquired sequences are motion degraded. Most notably, there is severe motion degradation of the sagittal T1 weighted sequence and axial T2 TSE sequence. Mild generalized parenchymal atrophy. Two adjacent acute infarcts within the cingulate gyrus/callosal body measuring up to 8 mm. Restricted diffusion within the posterior limb of right internal capsule, increased in extent as compared to the brain MRI of 07/25/2020. This may reflect persistent restricted diffusion at site of a subacute infarction, or an acute on subacute infarct. 6 mm acute infarct within the right parietal lobe subcortical white matter (series 5, image 20). Questionable punctate acute infarct within the left frontal lobe subcortical white matter (series 5, image 21). Known chronic cortical/subcortical infarcts within the high right frontal lobe and left temporal occipital lobes. Incompletely assessed severe chronic small vessel ischemic changes within the cerebral white matter. Known chronic right basal ganglia lacunar infarct. Within described limitations, no intracranial mass or extra-axial fluid collection is identified. No midline shift. Vascular: Flow voids poorly assessed due to the degree of motion degradation. Skull and upper cervical spine: Within described limitations, no focal suspicious marrow lesion is identified. Sinuses/Orbits: No acute orbital abnormality is identified. Mild mucosal thickening within the left maxillary sinus. MRA HEAD FINDINGS The examination is significantly motion degraded, precluding adequate evaluation for  intracranial arterial stenoses. No proximal arterial occlusion is identified within the intracranial internal carotid arteries, M1 middle cerebral arteries, proximal anterior cerebral arteries, intracranial vertebral arteries, basilar artery or proximal posterior cerebral arteries. The examination is the nondiagnostic more distally. 2 mm inferiorly projecting vascular protrusion arising from the cavernous left ICA, likely reflecting an aneurysm (series 1038, image 18). IMPRESSION: MRI brain: 1. Prematurely terminated and significantly motion degraded examination, as described and limiting evaluation. 2. Two adjacent acute infarcts within the right cingulate gyrus/callosal body (right ACA vascular territory). 3. Restricted diffusion within the posterior limb of right internal capsule, increased in extent as compared to the brain MRI of 07/25/2020. This may reflect persistent restricted diffusion at site of a known subacute infarct, or an acute on subacute infarct. 4. 6 mm acute infarct within the right parietal lobe subcortical white matter. 5. Questionable punctate acute infarct within the left frontal lobe subcortical white matter. 6. Given infarcts involving multiple vascular territories, consider an embolic process. 7. Known chronic cortical/subcortical infarcts within the high right frontal lobe and left occipital temporal lobes. 8. Incompletely assessed severe chronic small vessel ischemic changes within the cerebral white matter. 9. Known chronic right basal ganglia lacunar infarct. 10. Mild generalized parenchymal atrophy. MRA head: 1. Significantly motion degraded and limited examination, as described. 2. No proximal large vessel occlusion identified within the intracranial internal carotid arteries, M1 middle cerebral arteries, proximal anterior cerebral arteries, intracranial vertebral arteries, basilar artery or proximal posterior  cerebral arteries. The examination is non-diagnostic more distally. 3. 2 mm  inferiorly projecting vascular protrusion arising from the cavernous left ICA, likely reflecting an aneurysm. Electronically Signed   By: Jackey Loge DO   On: 09/11/2020 11:27   DG CHEST PORT 1 VIEW  Result Date: 09/11/2020 CLINICAL DATA:  Respiratory distress EXAM: PORTABLE CHEST 1 VIEW COMPARISON:  Radiograph 09/11/2020, chest CT 11/03/2019 FINDINGS: Unchanged cardiomediastinal silhouette. There are bibasilar airspace opacities. There is no large pleural effusion or visible pneumothorax. There is no acute osseous abnormality. IMPRESSION: Bibasilar airspace opacities, which could represent aspiration/pneumonia. Electronically Signed   By: Caprice Renshaw   On: 09/11/2020 18:59   DG Chest Portable 1 View  Result Date: 09/11/2020 CLINICAL DATA:  Shortness of breath. EXAM: PORTABLE CHEST 1 VIEW COMPARISON:  08/29/2020. FINDINGS: Mediastinum hilar structures normal. Heart size normal. Left lung base infiltrate consistent with pneumonia. Bibasilar atelectasis. No pleural effusion or pneumothorax. Degenerative change thoracic spine. IMPRESSION: Left lung base infiltrate consistent with pneumonia. Bibasilar atelectasis. Electronically Signed   By: Maisie Fus  Register   On: 09/11/2020 05:56   CT HEAD CODE STROKE WO CONTRAST`  Result Date: 09/11/2020 CLINICAL DATA:  Code stroke. Initial evaluation for acute left-sided weakness. EXAM: CT ANGIOGRAPHY HEAD AND NECK CT PERFUSION BRAIN TECHNIQUE: Multidetector CT imaging of the head and neck was performed using the standard protocol during bolus administration of intravenous contrast. Multiplanar CT image reconstructions and MIPs were obtained to evaluate the vascular anatomy. Carotid stenosis measurements (when applicable) are obtained utilizing NASCET criteria, using the distal internal carotid diameter as the denominator. Multiphase CT imaging of the brain was performed following IV bolus contrast injection. Subsequent parametric perfusion maps were calculated using  RAPID software. CONTRAST:  OMNIPAQUE IOHEXOL 350 MG/ML SOLN COMPARISON:  Prior MRI from 07/25/2020. FINDINGS: CT HEAD FINDINGS Brain: Generalized cerebral atrophy with advanced chronic microvascular ischemic disease. Multiple remote lacunar infarcts present about the bilateral basal ganglia and thalami. Chronic right frontal infarct, right ACA distribution. No acute intracranial hemorrhage. No visible acute large vessel territory infarct. No mass lesion, mass effect, or midline shift. No hydrocephalus or extra-axial fluid collection. Vascular: No hyperdense vessel. Scattered vascular calcifications noted within the carotid siphons. Skull: Scalp soft tissues demonstrate no acute finding. Calvarium intact. Sinuses/Orbits: Globes and orbital soft tissues demonstrate no acute finding. Mild scattered mucosal thickening noted within the ethmoidal air cells and maxillary sinuses. Paranasal sinuses are otherwise clear. No mastoid effusion. Other: None. ASPECTS (Alberta Stroke Program Early CT Score) - Ganglionic level infarction (caudate, lentiform nuclei, internal capsule, insula, M1-M3 cortex): 7 - Supraganglionic infarction (M4-M6 cortex): 3 Total score (0-10 with 10 being normal): 10 Review of the MIP images confirms the above findings CTA NECK FINDINGS Aortic arch: Visualized aortic arch normal caliber with normal 3 vessel morphology. Mild atheromatous change about the arch and origin of the great vessels without hemodynamically significant stenosis. Right carotid system: Right CCA patent from its origin to the bifurcation without stenosis. Concentric mixed plaque about the right carotid bulb/proximal right ICA with mild 35% stenosis by NASCET criteria. Right ICA patent distally without stenosis, dissection or occlusion. Left carotid system: Scattered eccentric plaque within the mid left CCA with associated narrowing of up to 40% by NASCET criteria. Bulky calcified plaque about the left carotid bulb/proximal left  ICA with associated stenosis of up to 55% by NASCET criteria. Left ICA patent distally without stenosis, dissection or occlusion. Vertebral arteries: Both vertebral arteries arise from the subclavian arteries. No proximal subclavian  artery stenosis. Vertebral arteries patent without stenosis, dissection or occlusion. Skeleton: No visible acute osseous finding. No discrete or worrisome osseous lesions. Other neck: No other acute soft tissue abnormality within the neck. No mass or adenopathy. Retained metallic density noted within the subcutaneous fat at the right face. Upper chest: Emphysematous changes noted within the visualized lungs. Few scattered patchy densities noted within the posterior left upper lobe, suspicious for infection/pneumonia. Visualized upper chest demonstrates no other acute finding. Review of the MIP images confirms the above findings CTA HEAD FINDINGS Anterior circulation: Petrous segments patent bilaterally. Mild atheromatous change within the carotid siphons without significant stenosis. A1 segments widely patent. Normal anterior communicating artery complex. Anterior cerebral arteries patent to their distal aspects without stenosis. No M1 stenosis or occlusion. Normal MCA bifurcations. No visible proximal MCA branch occlusion. Distal MCA branches well perfused and symmetric. Posterior circulation: Both V4 segments patent to the vertebrobasilar junction without stenosis. Right vertebral artery slightly dominant. Left PICA patent. Right PICA not seen. Basilar patent to its distal aspect without stenosis. Superior cerebral arteries patent bilaterally. Both PCAs primarily supplied via the basilar well perfused to their distal aspects. Venous sinuses: Grossly patent allowing for timing the contrast bolus. Anatomic variants: None significant.  No aneurysm. Review of the MIP images confirms the above findings CT Brain Perfusion Findings: ASPECTS: 10. CBF (<30%) Volume: 69mL Perfusion (Tmax>6.0s)  volume: 77mL Mismatch Volume: 2mL Infarction Location:No acute core infarct by CT perfusion. 7 cc did area of delayed perfusion involving the posterior right temporal occipital region. IMPRESSION: CT HEAD IMPRESSION: 1. No acute intracranial abnormality. 2. Aspects = 10. 3. Age-related cerebral atrophy with advanced chronic microvascular ischemic disease, with multiple remote lacunar infarcts about the bilateral basal ganglia and thalami, with additional chronic right frontal infarct. CTA HEAD AND NECK IMPRESSION: 1. Negative CTA for emergent large vessel occlusion. 2. Atheromatous change about the carotid bifurcations with associated stenosis of up to 55% on the left and 35% on the right. 3. Additional mild atheromatous change elsewhere about the major arterial vasculature of the head and neck as above. No other proximal high-grade or correctable stenosis. 4. Mild scattered patchy and nodular densities within the partially visualized left upper lobe, suspicious for acute infection/pneumonia. 5.  Emphysema (ICD10-J43.9). CT PERFUSION IMPRESSION: 1. No evidence for acute core infarct by CT perfusion. 2. 7 cc area of delayed perfusion within the posterior right temporoccipital region, which could reflect an area of all vein ischemia. Correlation with dedicated brain MRI suggested as warranted. Critical Value/emergent results were called by telephone at the time of interpretation on 09/11/2020 at 3:50 am to provider [facility] , who verbally acknowledged these results. Electronically Signed   By: Rise Mu M.D.   On: 09/11/2020 04:14   CT ANGIO HEAD NECK W WO CM W PERF (CODE STROKE)  Result Date: 09/11/2020 CLINICAL DATA:  Code stroke. Initial evaluation for acute left-sided weakness. EXAM: CT ANGIOGRAPHY HEAD AND NECK CT PERFUSION BRAIN TECHNIQUE: Multidetector CT imaging of the head and neck was performed using the standard protocol during bolus administration of intravenous contrast. Multiplanar CT  image reconstructions and MIPs were obtained to evaluate the vascular anatomy. Carotid stenosis measurements (when applicable) are obtained utilizing NASCET criteria, using the distal internal carotid diameter as the denominator. Multiphase CT imaging of the brain was performed following IV bolus contrast injection. Subsequent parametric perfusion maps were calculated using RAPID software. CONTRAST:  OMNIPAQUE IOHEXOL 350 MG/ML SOLN COMPARISON:  Prior MRI from 07/25/2020. FINDINGS: CT  HEAD FINDINGS Brain: Generalized cerebral atrophy with advanced chronic microvascular ischemic disease. Multiple remote lacunar infarcts present about the bilateral basal ganglia and thalami. Chronic right frontal infarct, right ACA distribution. No acute intracranial hemorrhage. No visible acute large vessel territory infarct. No mass lesion, mass effect, or midline shift. No hydrocephalus or extra-axial fluid collection. Vascular: No hyperdense vessel. Scattered vascular calcifications noted within the carotid siphons. Skull: Scalp soft tissues demonstrate no acute finding. Calvarium intact. Sinuses/Orbits: Globes and orbital soft tissues demonstrate no acute finding. Mild scattered mucosal thickening noted within the ethmoidal air cells and maxillary sinuses. Paranasal sinuses are otherwise clear. No mastoid effusion. Other: None. ASPECTS (Alberta Stroke Program Early CT Score) - Ganglionic level infarction (caudate, lentiform nuclei, internal capsule, insula, M1-M3 cortex): 7 - Supraganglionic infarction (M4-M6 cortex): 3 Total score (0-10 with 10 being normal): 10 Review of the MIP images confirms the above findings CTA NECK FINDINGS Aortic arch: Visualized aortic arch normal caliber with normal 3 vessel morphology. Mild atheromatous change about the arch and origin of the great vessels without hemodynamically significant stenosis. Right carotid system: Right CCA patent from its origin to the bifurcation without stenosis.  Concentric mixed plaque about the right carotid bulb/proximal right ICA with mild 35% stenosis by NASCET criteria. Right ICA patent distally without stenosis, dissection or occlusion. Left carotid system: Scattered eccentric plaque within the mid left CCA with associated narrowing of up to 40% by NASCET criteria. Bulky calcified plaque about the left carotid bulb/proximal left ICA with associated stenosis of up to 55% by NASCET criteria. Left ICA patent distally without stenosis, dissection or occlusion. Vertebral arteries: Both vertebral arteries arise from the subclavian arteries. No proximal subclavian artery stenosis. Vertebral arteries patent without stenosis, dissection or occlusion. Skeleton: No visible acute osseous finding. No discrete or worrisome osseous lesions. Other neck: No other acute soft tissue abnormality within the neck. No mass or adenopathy. Retained metallic density noted within the subcutaneous fat at the right face. Upper chest: Emphysematous changes noted within the visualized lungs. Few scattered patchy densities noted within the posterior left upper lobe, suspicious for infection/pneumonia. Visualized upper chest demonstrates no other acute finding. Review of the MIP images confirms the above findings CTA HEAD FINDINGS Anterior circulation: Petrous segments patent bilaterally. Mild atheromatous change within the carotid siphons without significant stenosis. A1 segments widely patent. Normal anterior communicating artery complex. Anterior cerebral arteries patent to their distal aspects without stenosis. No M1 stenosis or occlusion. Normal MCA bifurcations. No visible proximal MCA branch occlusion. Distal MCA branches well perfused and symmetric. Posterior circulation: Both V4 segments patent to the vertebrobasilar junction without stenosis. Right vertebral artery slightly dominant. Left PICA patent. Right PICA not seen. Basilar patent to its distal aspect without stenosis. Superior  cerebral arteries patent bilaterally. Both PCAs primarily supplied via the basilar well perfused to their distal aspects. Venous sinuses: Grossly patent allowing for timing the contrast bolus. Anatomic variants: None significant.  No aneurysm. Review of the MIP images confirms the above findings CT Brain Perfusion Findings: ASPECTS: 10. CBF (<30%) Volume: 67mL Perfusion (Tmax>6.0s) volume: 75mL Mismatch Volume: 78mL Infarction Location:No acute core infarct by CT perfusion. 7 cc did area of delayed perfusion involving the posterior right temporal occipital region. IMPRESSION: CT HEAD IMPRESSION: 1. No acute intracranial abnormality. 2. Aspects = 10. 3. Age-related cerebral atrophy with advanced chronic microvascular ischemic disease, with multiple remote lacunar infarcts about the bilateral basal ganglia and thalami, with additional chronic right frontal infarct. CTA HEAD AND NECK IMPRESSION: 1.  Negative CTA for emergent large vessel occlusion. 2. Atheromatous change about the carotid bifurcations with associated stenosis of up to 55% on the left and 35% on the right. 3. Additional mild atheromatous change elsewhere about the major arterial vasculature of the head and neck as above. No other proximal high-grade or correctable stenosis. 4. Mild scattered patchy and nodular densities within the partially visualized left upper lobe, suspicious for acute infection/pneumonia. 5.  Emphysema (ICD10-J43.9). CT PERFUSION IMPRESSION: 1. No evidence for acute core infarct by CT perfusion. 2. 7 cc area of delayed perfusion within the posterior right temporoccipital region, which could reflect an area of all vein ischemia. Correlation with dedicated brain MRI suggested as warranted. Critical Value/emergent results were called by telephone at the time of interpretation on 09/11/2020 at 3:50 am to provider [facility] , who verbally acknowledged these results. Electronically Signed   By: Rise Mu M.D.   On: 09/11/2020  04:14    Catarina Hartshorn, DO  [facility]  If 7PM-7AM, please contact night-coverage [email or web address] Password [email or web address] 09/12/2020, 10:30 AM   LOS: 1 day

## 2020-09-12 NOTE — Progress Notes (Signed)
Physical Therapy Treatment Patient Details Name: Brent [name] MRN: [id number] DOB: 1965/12/18 Today's Date: 09/12/2020    History of Present Illness Brent [name] is a 55 y.o. male with medical history of hypertension, hyperlipidemia, diabetes mellitus type 2, cocaine abuse, tobacco abuse, recent stroke presented with 2 to 3-day history of slurred speech, left hemiparesis, and shortness of breath.  The patient is a poor historian.  History is taken from review of the medical record and speaking with the patient's significant other.  Patient has been having worsening shortness of breath and coughing for the past 2 to 3 days.  There is no fevers, chills, hemoptysis, nausea, vomiting, diarrhea, chest pain.  He continues to smoke cigarettes.  He last smoked cocaine on 09/09/2020.  He denies any headache, visual disturbance.  He states that his left hemiparesis is about the same as usual.  There is no abdominal pain, dysuria, hematuria.  In the emergency department, the patient was afebrile.  He was tachycardic up to 105.  Oxygen saturation was 90-92% on 3 L.    PT Comments    Patient able to use his RUE to help pull self to sitting during bed mobility with slow labored movement, once seated, tends to fall to the left, after verbal/tactile cueing to use right trunk muscles able to keep trunk in midline supporting self mostly with RUE with fair/good trunk control.  Patient able to complete exercises with RLE while keeping trunk in midline with occasional leaning to the left, but required frequent rest breaks due to fatigue.  Patient able to stand with RW with 2 person assist mostly to keep left hand onto walker, unable to transfer using RW due to poor stand balance and required Max one person assist to stand pivot with left knee blocked to transfer to chair.  Patient tolerated sitting up in chair after therapy - RN aware.  Patient will benefit from continued physical therapy in hospital and recommended  venue below to increase strength, balance, endurance for safe ADLs and gait.    Follow Up Recommendations  CIR     Equipment Recommendations  None recommended by PT    Recommendations for Other Services       Precautions / Restrictions Precautions Precautions: Fall Restrictions Weight Bearing Restrictions: No    Mobility  Bed Mobility Overal bed mobility: Needs Assistance Bed Mobility: Rolling;Supine to Sit Rolling: Min assist;Mod assist   Supine to sit: Max assist     General bed mobility comments: increased time, labored movement, required use of bed rail using RUE    Transfers Overall transfer level: Needs assistance Equipment used: Rolling walker (2 wheeled);1 person hand held assist Transfers: Stand Pivot Transfers;Sit to/from Stand Sit to Stand: Max assist Stand pivot transfers: Max assist       General transfer comment: Patient unable transfer using RW due to left sided weakness/poor standing balance, required stand pivot with left knee blocked to transfer to chair  Ambulation/Gait                 Stairs             Wheelchair Mobility    Modified Rankin (Stroke Patients Only)       Balance Overall balance assessment: Needs assistance Sitting-balance support: Feet supported;Single extremity supported Sitting balance-Leahy Scale: Poor Sitting balance - Comments: fair/poor seated at EOB Postural control: Left lateral lean Standing balance support: Bilateral upper extremity supported;During functional activity Standing balance-Leahy Scale: Poor Standing balance comment: using RW  Cognition Arousal/Alertness: Awake/alert Behavior During Therapy: WFL for tasks assessed/performed Overall Cognitive Status: Within Functional Limits for tasks assessed                                        Exercises General Exercises - Lower Extremity Long Arc Quad:  Seated;AROM;Strengthening;AAROM;Both;10 reps Toe Raises: Seated;AROM;Strengthening;Right;10 reps Heel Raises: Seated;AROM;Strengthening;Right;10 reps    General Comments        Pertinent Vitals/Pain Pain Assessment: No/denies pain    Home Living                      Prior Function            PT Goals (current goals can now be found in the care plan section) Acute Rehab PT Goals Patient Stated Goal: return home after rehab PT Goal Formulation: With patient Time For Goal Achievement: 09/25/20 Potential to Achieve Goals: Good Progress towards PT goals: Progressing toward goals    Frequency    Min 4X/week      PT Plan Current plan remains appropriate    Co-evaluation PT/OT/SLP Co-Evaluation/Treatment: Yes Reason for Co-Treatment: Complexity of the patient's impairments (multi-system involvement);To address functional/ADL transfers;For patient/therapist safety PT goals addressed during session: Mobility/safety with mobility;Balance;Proper use of DME;Strengthening/ROM        AM-PAC PT "6 Clicks" Mobility   Outcome Measure  Help needed turning from your back to your side while in a flat bed without using bedrails?: A Lot Help needed moving from lying on your back to sitting on the side of a flat bed without using bedrails?: A Lot Help needed moving to and from a bed to a chair (including a wheelchair)?: A Lot Help needed standing up from a chair using your arms (e.g., wheelchair or bedside chair)?: A Lot Help needed to walk in hospital room?: Total Help needed climbing 3-5 steps with a railing? : Total 6 Click Score: 10    End of Session Equipment Utilized During Treatment: Oxygen Activity Tolerance: Patient tolerated treatment well;Patient limited by fatigue Patient left: in chair;with call bell/phone within reach;with chair alarm set Nurse Communication: Mobility status PT Visit Diagnosis: Unsteadiness on feet (R26.81);Other abnormalities of gait and  mobility (R26.89);Muscle weakness (generalized) (M62.81)     Time: 7209-4709 PT Time Calculation (min) (ACUTE ONLY): 32 min  Charges:  $Therapeutic Exercise: 8-22 mins $Therapeutic Activity: 8-22 mins                     2:16 PM, 09/12/20 Brent [name], MPT Physical Therapist with [facility] [phone] office [phone] mobile phone

## 2020-09-12 NOTE — Consult Note (Signed)
Consultation Note Date: 09/12/2020   Patient Name: Brent [name]  DOB: Dec 16, 1965  MRN: [id number]  Age / Sex: 55 y.o., male  PCP: Brent Finlay, MD Referring Physician: Orson Eva, MD  Reason for Consultation: Establishing goals of care  HPI/Patient Profile: 55 y.o. 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   Clinical Assessment and Goals of Care: Met with patient who was awake, alert and oriented. His mother was also present. Introduced Palliative medicine-   Palliative medicine is specialized medical care for people living with serious illness. It focuses on providing relief from the symptoms and stress of a serious illness. The goal is to improve quality of life for both the patient and the family. I also discussed the need to determine patient's goals of medical care and some anticipatory decision making.   Tim stated his goal was to "quit crack". He is very aware that is ongoing crack cocaine use is detrimental to his health and a causative factor of his strokes. He also admits to chain smoking and verbalizes this is also detrimental to his health.    Prior to admission Brent [name] was living in his home that is directly behind his mother's with his girlfriend Brent [name]. He was having PT work with him on his ambulation and was able to ambulate as far as his mother's house with a walker. He was independent with ADL's.   We discussed his current illness and barriers. He now has significant dysphagia and is unable to take in anything by mouth without risking aspiration. He is also having  difficulty controlling his own secretions- he has suction at bedside and is able to suction himself. His new stroke has left him in a less functional state than he was previously. He was able to stand with PT today using a rolling walker and able to pivot and transfer into a chair.  Brent [name] states his goals are to keep doing "whatever I need to do to live".  He defines living as- being awake, able to interact with people, and being able to get up and move around. If he were unable to do these things then he would not want efforts made to prolong his life. His mother is in agreement.  At this point he is interested in artificial feeding and would agree to a long term feeding tube (PEG) if necessary. We discussed that he is still at risk of aspirating with a PEG and he understands this- if (when) he has recurrence of aspiration pneumonia he would wish to return to the hospital and continue full scope treatment- however, would want his Mom to discuss his goals of care with the care team at that time and make decisions based on his current state  of health at that time.   Code status and artificial life support- yesterday Brent [name] stated he was ready to die and did not want to be on life support. He tells me that today he feels differently. If his heart stopped and he stopped breathing he would want CPR and ventilator. He would not want to be kept on in long term if he was not going to be able to resume functional status to his previously mentioned definition of living - being awake, able to interact, and be able to get out of bed and move around.   At this point he is willing to go to a rehab facility after his hospitalization with the ultimate goal of returning home.    Primary Decision Maker PATIENT    SUMMARY OF RECOMMENDATIONS -Full scope, full code -Feeding tube ok -Recommend outpatient Palliative to follow at his next venue (likely SNF) -MOST completed and scanned to [facility]    Code Status/Advance Care  Planning: Full code  Discharge Planning: [place] with Hospice  Primary Diagnoses: Present on Admission:  CVA (cerebral vascular accident) ([place])  Acute respiratory failure with hypoxia ([place])  Hypertriglyceridemia  Tobacco abuse  COPD with acute exacerbation ([place])  Cocaine abuse ([place])   I have reviewed the medical record, interviewed the patient and family, and examined the patient. The following aspects are pertinent.  Past Medical History:  Diagnosis Date   Aplastic anemia, unspecified (HCC)    Chest pain, unspecified    Chronic airway obstruction, not elsewhere classified    Cocaine substance abuse ([place])    Neuropathy    Other and unspecified hyperlipidemia    Shortness of breath    Stroke ([place]) 06/17/2020   Tobacco use disorder    Type II or unspecified type diabetes mellitus without mention of complication, not stated as uncontrolled    Unspecified epilepsy without mention of intractable epilepsy    Unspecified essential hypertension    Social History   Socioeconomic History   Marital status: Single    Spouse name: Not on file   Number of children: Not on file   Years of education: Not on file   Highest education level: Not on file  Occupational History   Not on file  Tobacco Use   Smoking status: Every Day    Packs/day: 1.00    Years: 30.00    Pack years: 30.00    Types: Cigarettes   Smokeless tobacco: Never  Substance and Sexual Activity   Alcohol use: Yes    Comment: drinks beer on the weekends   Drug use: Never   Sexual activity: Not on file  Other Topics Concern   Not on file  Social History Narrative   Lives in [place] with wife.   Has one daughter and one grandson.   Right handed    Social Determinants of Health   Financial Resource Strain: Not on file  Food Insecurity: Not on file  Transportation Needs: Not on file  Physical Activity: Not on file  Stress: Not on file  Social Connections: Not on file   Scheduled Meds:   aspirin  300 mg Rectal Daily   budesonide (PULMICORT) nebulizer solution  0.5 mg Nebulization BID   carbamazepine  400 mg Oral BID   enoxaparin (LOVENOX) injection  40 mg Subcutaneous Q24H   escitalopram  10 mg Oral Daily   fenofibrate  54 mg Oral Daily   ipratropium-albuterol  3 mL Nebulization Q6H   methylPREDNISolone (SOLU-MEDROL) injection  60 mg  Intravenous Q12H   rosuvastatin  20 mg Oral Daily   Continuous Infusions:  sodium chloride     azithromycin 500 mg (09/12/20 1016)   piperacillin-tazobactam (ZOSYN)  IV 3.375 g (09/12/20 0937)   PRN Meds:.acetaminophen **OR** acetaminophen (TYLENOL) oral liquid 160 mg/5 mL **OR** acetaminophen, ipratropium-albuterol, nitroGLYCERIN, senna-docusate Medications Prior to Admission:  Prior to Admission medications   Medication Sig Start Date End Date Taking? Authorizing Provider  albuterol (PROVENTIL) (2.5 MG/3ML) 0.083% nebulizer solution Take 2.5 mg by nebulization every 4 (four) hours as needed for wheezing or shortness of breath.   Yes [provider]  albuterol (VENTOLIN HFA) 108 (90 Base) MCG/ACT inhaler Inhale 2 puffs into the lungs every 6 (six) hours as needed for wheezing or shortness of breath.   Yes [provider]  alclomethasone (ACLOVATE) 0.05 % cream Apply 1 application topically 2 (two) times daily.   Yes [provider]  amLODipine (NORVASC) 10 MG tablet Take 10 mg by mouth daily.   Yes [provider]  aspirin EC 81 MG tablet Take 81 mg by mouth daily. Swallow whole.   Yes [provider]  atorvastatin (LIPITOR) 80 MG tablet Take 80 mg by mouth daily.   Yes [provider]  benazepril (LOTENSIN) 10 MG tablet Take 20 mg by mouth daily.   Yes [provider]  carbamazepine (TEGRETOL XR) 400 MG 12 hr tablet Take 1 tablet (400 mg total) by mouth 2 (two) times daily. 06/05/20  Yes Cameron Sprang, MD  fenofibrate 160 MG tablet Take 160 mg by mouth daily. 12/11/16  Yes  [provider]  gabapentin (NEURONTIN) 300 MG capsule Take 3 caps in AM, 3 caps at noon, 4 caps at bedtime Patient taking differently: Take 900-1,200 mg by mouth 3 (three) times daily. Take 3 caps in AM, 3 caps at noon, 4 caps at bedtime 06/05/20  Yes Cameron Sprang, MD  insulin glargine (LANTUS) 100 UNIT/ML injection Inject 70 Units into the skin daily as needed (depending on sugar level).   Yes [provider]  JANUMET 50-1000 MG tablet Take 1 tablet by mouth 2 (two) times daily. 07/10/20  Yes [provider]  Omega-3 Fatty Acids (FISH OIL) 1000 MG CAPS Take 500 mg by mouth 2 (two) times daily.   Yes [provider]  omeprazole (PRILOSEC) 40 MG capsule Take 1 capsule (40 mg total) by mouth 2 (two) times daily. 03/21/19  Yes Laurine Blazer B, PA-C  rosuvastatin (CRESTOR) 20 MG tablet Take 1 tablet (20 mg total) by mouth daily. 07/30/20  Yes Barton Dubois, MD  dextromethorphan-guaiFENesin [facility] DM) 30-600 MG 12hr tablet Take 1 tablet by mouth 2 (two) times daily. Patient not taking: Reported on 09/11/2020 11/08/19   Swayze, Ava, DO  food thickener (SIMPLYTHICK, HONEY/LEVEL 3/MODERATELY THICK,) LIQD Take 1 packet by mouth as needed. 07/29/20 11/26/20  Barton Dubois, MD   No Known Allergies Review of Systems  Physical Exam  Vital Signs: BP (!) 157/89 (BP Location: Left Arm)   Pulse 95   Temp 98.6 F (37 C) (Axillary)   Resp (!) 22   Ht 6' (1.829 m)   Wt 87.8 kg   SpO2 92%   BMI 26.25 kg/m  Pain Scale: 0-10   Pain Score: 6    SpO2: SpO2: 92 % O2 Device:SpO2: 92 % O2 Flow Rate: .O2 Flow Rate (L/min): 15 L/min  IO: Intake/output summary:  Intake/Output Summary (Last 24 hours) at 09/12/2020 1556 Last data filed at 09/12/2020 0424  Gross per 24 hour  Intake 517.24 ml  Output 2600 ml  Net -2082.76 ml    LBM: Last BM Date: 09/11/20 Baseline Weight: Weight: 85.7 kg Most recent weight: Weight: 87.8 kg     Palliative Assessment/Data: PPS:  10%     Thank you for this consult. Palliative medicine will continue to follow and assist as needed.   Time In: 1446 Time Out: 1615 Time Total: 89 mins Greater than 50%  of this time was spent counseling and coordinating care related to the above assessment and plan.  Signed by: Mariana Kaufman, AGNP-C Palliative Medicine    Please contact Palliative Medicine Team phone at [phone] for questions and concerns.  For individual provider: See Shea Evans

## 2020-09-12 NOTE — Progress Notes (Signed)
Modified Barium Swallow Progress Note  Patient Details  Name: Brent [name] MRN: [id number] Date of Birth: August 01, 1965  Today's Date: 09/12/2020  Modified Barium Swallow completed.  Full report located under Chart Review in the Imaging Section.  Brief recommendations include the following:  Clinical Impression  Pt presents with moderate oral phase and moderate pharyngeal phase dysphagia which result in consistent episodes of aspiration in small amounts, but are not able to be cleared by Pt despite weak, delayed cough. Oral phase is marked by weak lingual manipulation and bolus cohesion and pharyngeal phase is marked by variable delay in swallow trigger, but generally after filling the valleculae and adequate hyolaryngeal excursion, but poor laryngeal vestibule closure resulting in aspiration in trace amounts during the swallow and small amounts after the swallow from oral lingual residuals spilling directly into laryngeal vestibule and along anterior portion of trachea. Pt with very little pharyngeal residue from the initial bolus that reaches the pharynx, but consistent aspiration of lingual residuals after the swallow. Pt with occasional delayed and weak cough. He was unable to expel aspirates from his trachea. Pt with known dysphagia before these acute strokes and is now made worse and in setting of PNA and poor cough. Pt is at high risk for aspiration in small amounts, but over the course of a meal could be quite substantial. Family and Pt are discussing possibility of PEG tube while attempting to rehab. Recommend NPO with alternative means of nutrition and dysphagia therapy with small bites of puree and work on cough and oral phase. SLP will follow per goals of care. Continue oral care and can offer single ice chips for comfort after oral care with RN. Above to Dr. Arbutus Leas and RN.   Swallow Evaluation Recommendations       SLP Diet Recommendations: NPO;Alternative means - long-term        Medication Administration: Via alternative means (can consider crushed in small amount of puree)               Oral Care Recommendations: Oral care QID       Thank you,  Havery Moros, CCC-SLP [phone]  Maeve Debord 09/12/2020,3:31 PM
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
## 2020-09-12 NOTE — Plan of Care (Signed)
  Problem: Education: Goal: Knowledge of General Education information will improve Description Including pain rating scale, medication(s)/side effects and non-pharmacologic comfort measures Outcome: Progressing   

## 2020-09-12 NOTE — Plan of Care (Signed)
  Problem: Acute Rehab OT Goals (only OT should resolve) Goal: Pt. Will Perform Grooming Flowsheets (Taken 09/12/2020 1242) Pt Will Perform Grooming:  with min assist  with min guard assist  sitting  with adaptive equipment Goal: Pt. Will Perform Upper Body Dressing Flowsheets (Taken 09/12/2020 1242) Pt Will Perform Upper Body Dressing:  with min assist  with min guard assist  sitting  bed level Goal: Pt. Will Perform Lower Body Dressing Flowsheets (Taken 09/12/2020 1242) Pt Will Perform Lower Body Dressing:  with mod assist  with max assist  sitting/lateral leans  bed level  with adaptive equipment Goal: Pt. Will Transfer To Toilet Flowsheets (Taken 09/12/2020 1242) Pt Will Transfer to Toilet:  with mod assist  stand pivot transfer  bedside commode Goal: Pt/Caregiver Will Perform Home Exercise Program Flowsheets (Taken 09/12/2020 1242) Pt/caregiver will Perform Home Exercise Program:  Increased ROM  Left upper extremity  With minimal assist  With Supervision  Tyrhonda Georgiades OT, MOT

## 2020-09-12 NOTE — Plan of Care (Signed)
  Problem: Education: Goal: Knowledge of General Education information will improve Description: Including pain rating scale, medication(s)/side effects and non-pharmacologic comfort measures Outcome: Progressing   Problem: Ischemic Stroke/TIA Tissue Perfusion: Goal: Complications of ischemic stroke/TIA will be minimized Outcome: Progressing

## 2020-09-12 NOTE — Progress Notes (Signed)
Pt resting and asleep, no distress noted treatment on hold

## 2020-09-12 NOTE — Evaluation (Signed)
Occupational Therapy Evaluation Patient Details Name: Brent [name] MRN: [id number] DOB: Mar 05, 1965 Today's Date: 09/12/2020    History of Present Illness Brent [name] is a 55 y.o. male with medical history of hypertension, hyperlipidemia, diabetes mellitus type 2, cocaine abuse, tobacco abuse, recent stroke presented with 2 to 3-day history of slurred speech, left hemiparesis, and shortness of breath.  The patient is a poor historian.  History is taken from review of the medical record and speaking with the patient's significant other.  Patient has been having worsening shortness of breath and coughing for the past 2 to 3 days.  There is no fevers, chills, hemoptysis, nausea, vomiting, diarrhea, chest pain.  He continues to smoke cigarettes.  He last smoked cocaine on 09/09/2020.  He denies any headache, visual disturbance.  He states that his left hemiparesis is about the same as usual.  There is no abdominal pain, dysuria, hematuria.  In the emergency department, the patient was afebrile.  He was tachycardic up to 105.  Oxygen saturation was 90-92% on 3 L.   Clinical Impression   Pt agreeable to OT/PT co-evaluation. Pt reports requiring assist for ADL and IADL's at baseline. Pt presents with 2+/5 MMT for L grip. Pt was able to bear weight partially on L UE but did not demonstrate more than trace movement in anything other than digits during formal testing. 1 on modified ashworth scale for tone during extension of elbow. Pt requires Max A for bed mobility supine to sit and max A for stand pivot transfers. Pt required total assist to don socks at bed level. Pt was able to sit upright at EOB with min G to min A periodically. Pt will benefit form continued OT in the hospital and recommended venue below to increase strength, balance, and endurance for safe ADL's.     Follow Up Recommendations  CIR    Equipment Recommendations  None recommended by OT           Precautions / Restrictions  Precautions Precautions: Fall Restrictions Weight Bearing Restrictions: No      Mobility Bed Mobility Overal bed mobility: Needs Assistance Bed Mobility: Supine to Sit;Rolling Rolling: Min assist;Mod assist   Supine to sit: Max assist     General bed mobility comments: limited mostly due to left side weakness, able to hold sitting balance with minG to min A at EOB    Transfers Overall transfer level: Needs assistance Equipment used: Rolling walker (2 wheeled) Transfers: Stand Pivot Transfers;Sit to/from Stand Sit to Stand: Max assist Stand pivot transfers: Max assist       General transfer comment: PT completing transfer with OT present for support if needed. Assisting at times to hold L UE on RW.    Balance Overall balance assessment: Needs assistance Sitting-balance support: Feet supported;Bilateral upper extremity supported Sitting balance-Leahy Scale: Poor Sitting balance - Comments: seated at EOB Postural control: Left lateral lean Standing balance support: Bilateral upper extremity supported;During functional activity Standing balance-Leahy Scale: Poor Standing balance comment: using RW                           ADL either performed or assessed with clinical judgement   ADL Overall ADL's : Needs assistance/impaired Eating/Feeding: Set up   Grooming: Minimal assistance;Moderate assistance;Sitting   Upper Body Bathing: Moderate assistance;Minimal assistance;Bed level   Lower Body Bathing: Maximal assistance   Upper Body Dressing : Moderate assistance;Sitting   Lower Body Dressing: Total assistance;Bed level  Toilet Transfer: Maximal assistance;RW;Stand-pivot Statistician Details (indicate cue type and reason): Simulated via EOB to chair transfer. Toileting- Clothing Manipulation and Hygiene: Maximal assistance;Total assistance   Tub/ Shower Transfer: Maximal assistance   Functional mobility during ADLs: Maximal assistance General ADL  Comments: Clinical judgment used to determine ADL levels except pt did receive total assist to don socks at bed level.     Vision Baseline Vision/History: No visual deficits (Had double vision in the past, but this has resolved.) Patient Visual Report: No change from baseline Vision Assessment?: Yes Tracking/Visual Pursuits: Other (comment) (Difficulty dissociating head and eye movements.) Convergence: Other (comment) (Mild difficulty to maintain convergence.) Visual Fields: No apparent deficits                Pertinent Vitals/Pain Pain Assessment: No/denies pain     Hand Dominance Right   Extremity/Trunk Assessment Upper Extremity Assessment Upper Extremity Assessment: RUE deficits/detail;LUE deficits/detail RUE Deficits / Details: genralized weakness RUE Coordination: WNL LUE Deficits / Details: Pt demonstrates 2+/5 MMT in digits. 1/5 elbow MMT for flexion. No other trace movements noted during formal assessment but pt was able to bear some weight on L UE while seated at EOB. LUE Sensation: WNL LUE Coordination: decreased fine motor;decreased gross motor   Lower Extremity Assessment Lower Extremity Assessment: Defer to PT evaluation   Cervical / Trunk Assessment Cervical / Trunk Assessment: Kyphotic   Communication Communication Communication: Expressive difficulties   Cognition Arousal/Alertness: Awake/alert Behavior During Therapy: WFL for tasks assessed/performed Overall Cognitive Status: Within Functional Limits for tasks assessed                                                      Home Living Family/patient expects to be discharged to:: Private residence Living Arrangements: Spouse/significant other Available Help at Discharge: Family;Available 24 hours/day Type of Home: House Home Access: Level entry     Home Layout: One level     Bathroom Shower/Tub: Chief Strategy Officer: Handicapped height Bathroom Accessibility:  Yes   Home Equipment: Toilet riser;Walker - 2 wheels;Bedside commode;Shower seat;Grab bars - toilet;Cane - quad;Wheelchair Writer (comment)          Prior Functioning/Environment Level of Independence: Needs assistance  Gait / Transfers Assistance Needed: Patient states RW use for ambulation, family assists PRN ADL's / Homemaking Assistance Needed: Assisted by family Communication / Swallowing Assistance Needed: mild slurred speech          OT Problem List: Decreased strength;Decreased range of motion;Decreased activity tolerance;Impaired balance (sitting and/or standing);Decreased coordination;Decreased knowledge of use of DME or AE;Impaired tone;Impaired UE functional use      OT Treatment/Interventions: Self-care/ADL training;Therapeutic exercise;Neuromuscular education;DME and/or AE instruction;Manual therapy;Splinting;Therapeutic activities;Patient/family education;Balance training    OT Goals(Current goals can be found in the care plan section) Acute Rehab OT Goals Patient Stated Goal: return home after rehab OT Goal Formulation: With patient Time For Goal Achievement: 09/26/20 Potential to Achieve Goals: Fair  OT Frequency: Min 2X/week               Co-evaluation PT/OT/SLP Co-Evaluation/Treatment: Yes Reason for Co-Treatment: Complexity of the patient's impairments (multi-system involvement)   OT goals addressed during session: ADL's and self-care;Strengthening/ROM                       End of  Session Equipment Utilized During Treatment: Rolling walker  Activity Tolerance: Patient tolerated treatment well Patient left: in chair;with call bell/phone within reach;with chair alarm set  OT Visit Diagnosis: Unsteadiness on feet (R26.81);Muscle weakness (generalized) (M62.81);Other symptoms and signs involving the nervous system (R29.898);Hemiplegia and hemiparesis Hemiplegia - Right/Left: Left Hemiplegia - dominant/non-dominant:  Non-Dominant Hemiplegia - caused by: Cerebral infarction                Time: 0935-1006 OT Time Calculation (min): 31 min Charges:  OT General Charges $OT Visit: 1 Visit OT Evaluation $OT Eval Moderate Complexity: 1 Mod  Vickii Volland OT, MOT  Danie Chandler 09/12/2020, 12:37 PM

## 2020-09-13 ENCOUNTER — Inpatient Hospital Stay (HOSPITAL_COMMUNITY): Payer: Medicaid Other

## 2020-09-13 DIAGNOSIS — I6389 Other cerebral infarction: Secondary | ICD-10-CM

## 2020-09-13 DIAGNOSIS — J690 Pneumonitis due to inhalation of food and vomit: Secondary | ICD-10-CM | POA: Diagnosis not present

## 2020-09-13 DIAGNOSIS — F1410 Cocaine abuse, uncomplicated: Secondary | ICD-10-CM | POA: Diagnosis not present

## 2020-09-13 DIAGNOSIS — J441 Chronic obstructive pulmonary disease with (acute) exacerbation: Secondary | ICD-10-CM | POA: Diagnosis not present

## 2020-09-13 DIAGNOSIS — J9621 Acute and chronic respiratory failure with hypoxia: Secondary | ICD-10-CM | POA: Diagnosis not present

## 2020-09-13 LAB — CBC
HCT: 41.6 % (ref 39.0–52.0)
Hemoglobin: 13.8 g/dL (ref 13.0–17.0)
MCH: 32.3 pg (ref 26.0–34.0)
MCHC: 33.2 g/dL (ref 30.0–36.0)
MCV: 97.4 fL (ref 80.0–100.0)
Platelets: 298 10*3/uL (ref 150–400)
RBC: 4.27 MIL/uL (ref 4.22–5.81)
RDW: 13.1 % (ref 11.5–15.5)
WBC: 14 10*3/uL — ABNORMAL HIGH (ref 4.0–10.5)
nRBC: 0 % (ref 0.0–0.2)

## 2020-09-13 LAB — COMPREHENSIVE METABOLIC PANEL
ALT: 12 U/L (ref 0–44)
AST: 13 U/L — ABNORMAL LOW (ref 15–41)
Albumin: 3.5 g/dL (ref 3.5–5.0)
Alkaline Phosphatase: 70 U/L (ref 38–126)
Anion gap: 10 (ref 5–15)
BUN: 22 mg/dL — ABNORMAL HIGH (ref 6–20)
CO2: 27 mmol/L (ref 22–32)
Calcium: 9.2 mg/dL (ref 8.9–10.3)
Chloride: 104 mmol/L (ref 98–111)
Creatinine, Ser: 0.86 mg/dL (ref 0.61–1.24)
GFR, Estimated: >60 mL/min (ref >60)
Glucose, Bld: 137 mg/dL — ABNORMAL HIGH (ref 70–99)
Potassium: 3.6 mmol/L (ref 3.5–5.1)
Sodium: 141 mmol/L (ref 135–145)
Total Bilirubin: 0.7 mg/dL (ref 0.3–1.2)
Total Protein: 7 g/dL (ref 6.5–8.1)

## 2020-09-13 LAB — ECHOCARDIOGRAM COMPLETE BUBBLE STUDY
AR max vel: 2.42 cm2
AV Area VTI: 2.31 cm2
AV Area mean vel: 2.51 cm2
AV Mean grad: 5 mmHg
AV Peak grad: 8.8 mmHg
Ao pk vel: 1.48 m/s
Area-P 1/2: 3.68 cm2
MV VTI: 3.31 cm2
S' Lateral: 2.51 cm

## 2020-09-13 LAB — MAGNESIUM: Magnesium: 1.6 mg/dL — ABNORMAL LOW (ref 1.7–2.4)

## 2020-09-13 LAB — HEMOGLOBIN A1C
Hgb A1c MFr Bld: 6.4 % — ABNORMAL HIGH (ref 4.8–5.6)
Mean Plasma Glucose: 137 mg/dL

## 2020-09-13 MED ORDER — ARFORMOTEROL TARTRATE 15 MCG/2ML IN NEBU
15.0000 ug | INHALATION_SOLUTION | Freq: Two times a day (BID) | RESPIRATORY_TRACT | Status: DC
  Administered 2020-09-13 – 2020-09-27 (×28): 15 ug via RESPIRATORY_TRACT
  Filled 2020-09-13 (×27): qty 2

## 2020-09-13 MED ORDER — MAGNESIUM SULFATE 2 GM/50ML IV SOLN
2.0000 g | Freq: Once | INTRAVENOUS | Status: AC
  Administered 2020-09-13: 2 g via INTRAVENOUS
  Filled 2020-09-13: qty 50

## 2020-09-13 NOTE — Progress Notes (Addendum)
Nurse called for RT to come and assess pt for decrease in spo2 and bs...pt spo2 86-88% on Salter high flow, pt asking about if "he should get up or what he should do." Rt informed him to relax and stay in bed, Breathing treatment given and NRB placed over HFNC CPT through bed was also preformed pt tolerated well spo2 increased to 92%.. pt was having more so stridor breath sounds different from previous bs.Nurse informed.

## 2020-09-13 NOTE — Progress Notes (Signed)
Initial Nutrition Assessment  DOCUMENTATION CODES:      INTERVENTION:   If PEG placed- recommend:  Start Osmolite 1.2 @ 40 ml/hr via PEG and increase by 10 ml every 6 hours to goal rate of 75 ml/hr.   Add ProSource TF 45 ml per tube   Tube feeding regimen provides 2200 kcal (100% of needs), 111 grams of protein, and 1476 ml of H2O.    Add free water - 200 ml TID and 30 ml before and after medication administration.  NUTRITION DIAGNOSIS:   Inadequate oral intake related to dysphagia (acute ischemic/embolic stroke) as evidenced by NPO status (SLP recommending NPO with alternate feeding source. Hx of aspiration PNA and previous strokes).   GOAL:  Patient will meet greater than or equal to 90% of their needs   MONITOR:  TF tolerance, Labs, Weight trends, PO intake, Supplement acceptance  REASON FOR ASSESSMENT:   Malnutrition Screening Tool    ASSESSMENT: Patient is a 55 yo  with DM2, Stroke, aspiration pneumonia and crack cocaine abuse. On 7/27 acute ischemic/embolic stroke with left hemiparesis, dysarthria and severe dysphagia. Patient discussed during rounds.  Patient is followed by SLP -hx of dysphagia. 7/28- recommending NPO. Current intake 0%. Palliative discussing care goals with patient. He is agreeable to have PEG placed if a candidate. Will include recommendations should this become and option for him.   Weights reviewed. Fluctuations between 86-90 kg. Patient at high risk for malnutrition given his inability to meet nutrition needs orally.  Medications reviewed and include: solumedrol, Nicoderm and Crestor.   Labs:  BMP Latest Ref Rng & Units 09/13/2020 09/12/2020 09/11/2020  Glucose 70 - 99 mg/dL 161(W) 960(A) 540(J)  BUN 6 - 20 mg/dL 81(X) 20 16  Creatinine 0.61 - 1.24 mg/dL 9.14 7.82 9.56  Sodium 135 - 145 mmol/L 141 139 140  Potassium 3.5 - 5.1 mmol/L 3.6 4.1 3.8  Chloride 98 - 111 mmol/L 104 103 104  CO2 22 - 32 mmol/L 27 27 29   Calcium 8.9 - 10.3 mg/dL 9.2  9.2 9.3     NUTRITION - FOCUSED PHYSICAL EXAM: Unable to complete Nutrition-Focused physical exam at this time.    Diet Order:   Diet Order             DIET - DYS 1 Room service appropriate? Yes; Fluid consistency: Honey Thick  Diet effective now                   EDUCATION NEEDS:  Education needs have been addressed  Skin:  Skin Assessment: Reviewed RN Assessment  Last BM:  7/27  Height:   Ht Readings from Last 1 Encounters:  09/11/20 6' (1.829 m)    Weight:   Wt Readings from Last 1 Encounters:  09/12/20 87.8 kg    Ideal Body Weight:   81 kg  BMI:  Body mass index is 26.25 kg/m.  Estimated Nutritional Needs:   Kcal:  2200-2400  Protein:  105-115 gr  Fluid:  >2000 ml daily  09/14/20 MS,RD,CSG,LDN Contact: Royann Shivers

## 2020-09-13 NOTE — Progress Notes (Signed)
Pt resting comfortable with no distress spo2 96% on 15lpm Salter HFNC. Breathing treatment not given at this time

## 2020-09-13 NOTE — Progress Notes (Signed)
SLP Cancellation Note  Patient Details Name: Brent [name] MRN: [id number] DOB: Dec 30, 1965   Cancelled treatment:       Reason Eval/Treat Not Completed: Patient at procedure or test/unavailable. ST will continue to follow acutely. Thank you,  Jheri Mitter H. Romie Levee, CCC-SLP Speech Language Pathologist   Georgetta Haber 09/13/2020, 11:06 AM

## 2020-09-13 NOTE — Plan of Care (Signed)
  Problem: Education: Goal: Knowledge of General Education information will improve Description Including pain rating scale, medication(s)/side effects and non-pharmacologic comfort measures Outcome: Progressing   Problem: Education: Goal: Knowledge of disease or condition will improve Outcome: Progressing Goal: Knowledge of secondary prevention will improve Outcome: Progressing Goal: Knowledge of patient specific risk factors addressed and post discharge goals established will improve Outcome: Progressing   Problem: Ischemic Stroke/TIA Tissue Perfusion: Goal: Complications of ischemic stroke/TIA will be minimized Outcome: Progressing   

## 2020-09-13 NOTE — Progress Notes (Signed)
Inpatient Rehabilitation Admissions Coordinator  Patient not yet at a level to consider for CIR admit. We will follow up next week on his progress. Please call me with any questions.  Ottie Glazier, RN, MSN Rehab Admissions Coordinator [phone] 09/13/2020 9:29 AM
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
## 2020-09-13 NOTE — Progress Notes (Signed)
  Echocardiogram 2D Echocardiogram has been performed.  Carolyne Fiscal 09/13/2020, 10:39 AM

## 2020-09-14 ENCOUNTER — Other Ambulatory Visit: Payer: Self-pay

## 2020-09-14 ENCOUNTER — Encounter (HOSPITAL_COMMUNITY): Payer: Self-pay | Admitting: Internal Medicine

## 2020-09-14 ENCOUNTER — Inpatient Hospital Stay (HOSPITAL_COMMUNITY): Payer: Medicaid Other

## 2020-09-14 DIAGNOSIS — J441 Chronic obstructive pulmonary disease with (acute) exacerbation: Secondary | ICD-10-CM | POA: Diagnosis not present

## 2020-09-14 DIAGNOSIS — J690 Pneumonitis due to inhalation of food and vomit: Secondary | ICD-10-CM | POA: Diagnosis not present

## 2020-09-14 DIAGNOSIS — J9621 Acute and chronic respiratory failure with hypoxia: Secondary | ICD-10-CM | POA: Diagnosis not present

## 2020-09-14 DIAGNOSIS — F1410 Cocaine abuse, uncomplicated: Secondary | ICD-10-CM | POA: Diagnosis not present

## 2020-09-14 LAB — BASIC METABOLIC PANEL
Anion gap: 10 (ref 5–15)
BUN: 25 mg/dL — ABNORMAL HIGH (ref 6–20)
CO2: 28 mmol/L (ref 22–32)
Calcium: 9.2 mg/dL (ref 8.9–10.3)
Chloride: 104 mmol/L (ref 98–111)
Creatinine, Ser: 0.9 mg/dL (ref 0.61–1.24)
GFR, Estimated: >60 mL/min (ref >60)
Glucose, Bld: 142 mg/dL — ABNORMAL HIGH (ref 70–99)
Potassium: 3.9 mmol/L (ref 3.5–5.1)
Sodium: 142 mmol/L (ref 135–145)

## 2020-09-14 LAB — CBC
HCT: 41.8 % (ref 39.0–52.0)
Hemoglobin: 13.7 g/dL (ref 13.0–17.0)
MCH: 32.2 pg (ref 26.0–34.0)
MCHC: 32.8 g/dL (ref 30.0–36.0)
MCV: 98.1 fL (ref 80.0–100.0)
Platelets: 325 10*3/uL (ref 150–400)
RBC: 4.26 MIL/uL (ref 4.22–5.81)
RDW: 13.1 % (ref 11.5–15.5)
WBC: 14.2 10*3/uL — ABNORMAL HIGH (ref 4.0–10.5)
nRBC: 0 % (ref 0.0–0.2)

## 2020-09-14 LAB — BLOOD GAS, ARTERIAL
Acid-Base Excess: 4.8 mmol/L — ABNORMAL HIGH (ref 0.0–2.0)
Bicarbonate: 27.8 mmol/L (ref 20.0–28.0)
FIO2: 80
O2 Saturation: 79.7 %
Patient temperature: 36.7
pCO2 arterial: 44 mmHg (ref 32.0–48.0)
pH, Arterial: 7.432 (ref 7.350–7.450)
pO2, Arterial: 43.4 mmHg — ABNORMAL LOW (ref 83.0–108.0)

## 2020-09-14 LAB — MAGNESIUM: Magnesium: 2 mg/dL (ref 1.7–2.4)

## 2020-09-14 IMAGING — DX DG CHEST 1V PORT
1 series · 1 of 1 positions shown · non-contrast
Comparison: Chest radiograph [DATE].

CLINICAL DATA: Respiratory distress.

EXAM:
PORTABLE CHEST 1 VIEW

[chest ap]
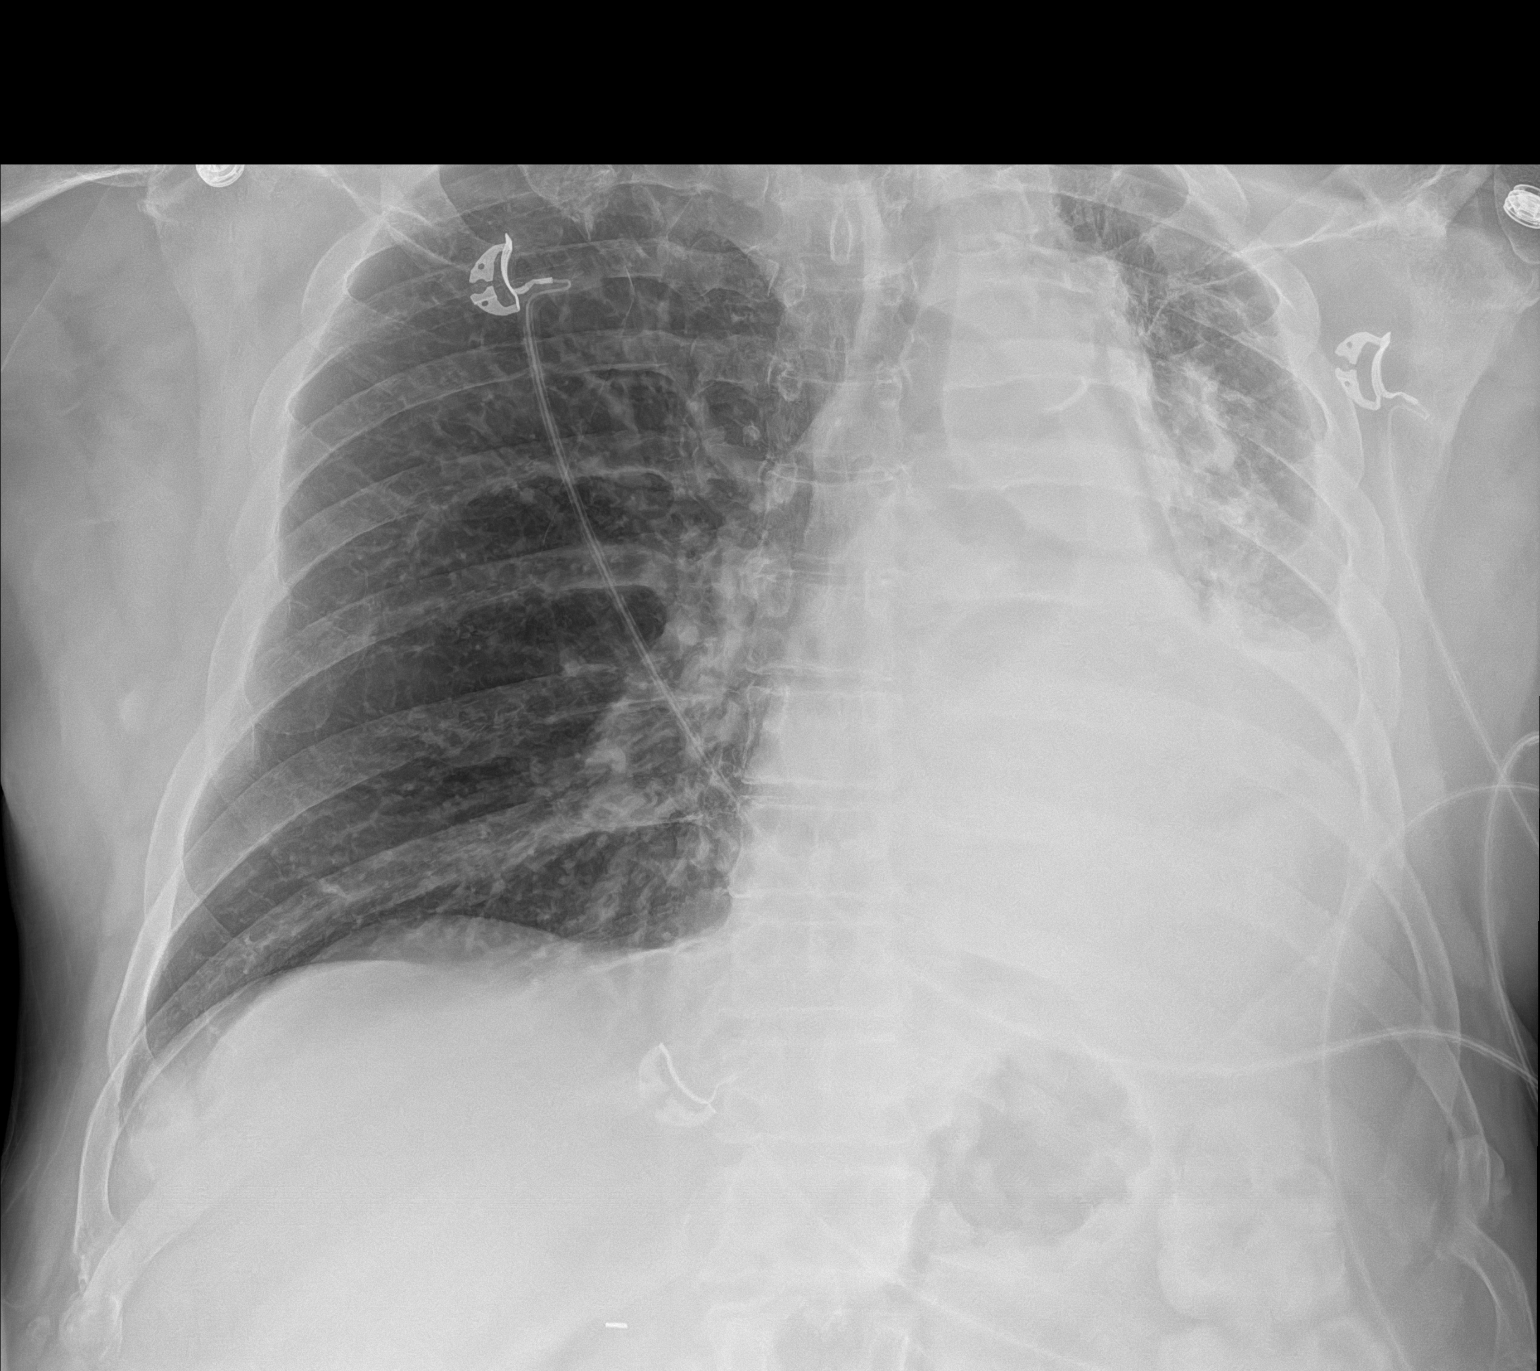

[1 of 1 positions shown; findings below may reference images not displayed]

FINDINGS: Leftward shift of the mediastinum. Interval consolidation of the
majority of the left lung. Minimal residual aerated left upper lung.
Right lung is clear. Thoracic spine degenerative changes.
IMPRESSION: Findings most compatible with interval atelectasis of the majority
of the left lung with leftward mediastinal shift.

These results will be called to the ordering clinician or
representative by the Radiologist Assistant, and communication
documented in the PACS or [REDACTED].

## 2020-09-14 MED ORDER — ACETYLCYSTEINE 20 % IN SOLN
RESPIRATORY_TRACT | Status: AC
  Administered 2020-09-14: 800 mg
  Filled 2020-09-14: qty 4

## 2020-09-14 MED ORDER — ACETYLCYSTEINE 10 % IN SOLN
4.0000 mL | Freq: Three times a day (TID) | RESPIRATORY_TRACT | Status: DC
  Administered 2020-09-15: 4 mL via RESPIRATORY_TRACT
  Filled 2020-09-14 (×6): qty 4

## 2020-09-14 NOTE — Progress Notes (Signed)
Responded to nursing call:  hypoxia   Subjective: Patient has had progressive hypoxia on his 11L HFNC.  He denies any cp or worsening sob.  He has been getting ice chips every 45 min.  Denies n/c  Vitals:   09/14/20 1425 09/14/20 1621 09/14/20 1700 09/14/20 1800  BP:   (!) 158/88 (!) 161/86  Pulse:  74 90 83  Resp:  19 (!) 23 20  Temp:  98.1 F (36.7 C)    TempSrc:  Oral    SpO2: 94% 92% 92% (!) 81%  Weight:      Height:       CV--RRR Lung--diminished BS bilateral.  Bibasilar rales. Abd--soft+BS/NT   Assessment/Plan: Acute on chronic respiratory failure -ABG -CXR -increased [place] HF to 15L -add NRB mask to [place] -significant other updated -confirmed full code status     Catarina Hartshorn, DO [facility]

## 2020-09-14 NOTE — Progress Notes (Signed)
PROGRESS NOTE  TREYCE SPILLERS FXT:[id number] DOB: 09/10/1965 DOA: 09/11/2020 PCP: Beatrix Fetters, MD  Brief History:  55 y.o. male with medical history of hypertension, hyperlipidemia, diabetes mellitus type 2, cocaine abuse, tobacco abuse, recent stroke presented with 2 to 3-day history of slurred speech, left hemiparesis, and shortness of breath.  The patient is a poor historian.  History is taken from review of the medical record and speaking with the patient's significant other.  Patient has been having worsening shortness of breath and coughing for the past 2 to 3 days.  There is no fevers, chills, hemoptysis, nausea, vomiting, diarrhea, chest pain.  He continues to smoke cigarettes.  He last smoked cocaine on 09/09/2020.  He denies any headache, visual disturbance.  He states that his left hemiparesis is about the same as usual.  There is no abdominal pain, dysuria, hematuria. In the emergency department, the patient was afebrile.  He was tachycardic up to 105.  Oxygen saturation was 90-92% on 3 L.  CXR showed bibasilar opacities.  He was started on zosyn for aspiration pneumonia.  Neurology was consulted to assist with management.     Assessment/Plan: Acute respiratory failure with hypoxia -due to aspiration pneumonia and COPD exacerbation -Currently on 10 L >>15L HFNC>>11L -goal is saturation of 90% and above   Aspiration pneumonia -Continue IV Zosyn -Speech therapy evaluation-->not safe for po intake -patient is very impulse and has difficulty following instructions regarding meals/eating -7/27 evening--had another aspirational event due to impulsivness   COPD exacerbation -Continue Pulmicort -Continue duo nebs -Continue  IV Solu-Medrol -Continue Brovana   Acute Ischemic/Embolic Stroke -presented with Left Hemiparesis/Dysarthria -CTA head and neck--negative for LVO; 55% left carotid, 35% right carotid stenosis -MRI brain--2 acute infarcts R-cingulate gyrus;  acute/subacute infart R-internal capsule and R-parietal lobe -CT perfusion brain--no acute core infarct, 7 cc area of delayed perfusion in the right posterior temporal occipital lobe -Continue aspirin--plavix on hold in preparation for PEG -PT eval>>CIR recommended -LDL 56 -07/26/20 A1C--7.0 -appreciate neurology consult>>continue DAPT -plavix on hold while waiting for PEG   Sepsis -due to pneumonia -present on admission -start IVF>>saline locked -check lactic--1.2 -check PCT <0.10 -Continue IV zosyn   Polysubstance abuse -Including cocaine and tobacco -Last used cocaine 09/09/2020 -Cessation discussed   Diabetes mellitus type 2 -NovoLog sliding scale -holding Janumet -07/26/20 A1c--7.0 -09/12/20 A1C--6.4 -CBGs controlled   Essential hypertension -Allow permissive hypertension at this time -holding benazepril, metoprolol temporarily   Hyperlipidemia -Continue Crestor and fenofibrate   Chronic back pain -Continue gabapentin   Goals of Care -discussed with daughter and significant other-->confirm full code -Advance care planning, including the explanation and discussion of advance directives was carried out with the patient and family.  Code status including explanations of "Full Code" and "DNR" and alternatives were discussed in detail.  Discussion of end-of-life issues including but not limited palliative care, hospice care and the concept of hospice, other end-of-life care options, power of attorney for health care decisions, living wills, and physician orders for life-sustaining treatment were also discussed with the patient and family.  Total face to face time 20 minutes.             Status is: Inpatient   Remains inpatient appropriate because:Inpatient level of care appropriate due to severity of illness   Dispo: The patient is from: Home              Anticipated d/c is to: Home  Patient currently is not medically stable to d/c.               Difficult to place patient Yes               Family Communication:   daughter updated 7/29   Consultants:  palliative med   Code Status:  FULL   DVT Prophylaxis:   [place] Lovenox     Procedures: As Listed in Progress Note Above   Antibiotics: Zosyn 7/27>> Azithro 7/27>>  Subjective: Patient denies fevers, chills, headache, chest pain, dyspnea, nausea, vomiting, diarrhea, abdominal pain, dysuria   Objective: Vitals:   09/14/20 0900 09/14/20 1000 09/14/20 1100 09/14/20 1128  BP: 127/78 (!) 143/83 140/78   Pulse: 93 90 84 66  Resp: 19 19 (!) 24 19  Temp:    98.7 F (37.1 C)  TempSrc:    Oral  SpO2: 93% 92% 94% 95%  Weight:      Height:        Intake/Output Summary (Last 24 hours) at 09/14/2020 1420 Last data filed at 09/14/2020 1058 Gross per 24 hour  Intake 1089.77 ml  Output 800 ml  Net 289.77 ml   Weight change:  Exam:  General:  Pt is alert, follows commands appropriately, not in acute distress HEENT: No icterus, No thrush, No neck mass, /AT Cardiovascular: RRR, S1/S2, no rubs, no gallops Respiratory: bilateral scattered rhonchi Abdomen: Soft/+BS, non tender, non distended, no guarding Extremities: No edema, No lymphangitis, No petechiae, No rashes, no synovitis   Data Reviewed: I have personally reviewed following labs and imaging studies Basic Metabolic Panel: Recent Labs  Lab 09/11/20 0312 09/12/20 1020 09/13/20 0438 09/14/20 0330  NA 140 139 141 142  K 3.8 4.1 3.6 3.9  CL 104 103 104 104  CO2 29 27 27 28   GLUCOSE 135* 197* 137* 142*  BUN 16 20 22* 25*  CREATININE 0.87 0.90 0.86 0.90  CALCIUM 9.3 9.2 9.2 9.2  MG  --   --  1.6* 2.0   Liver Function Tests: Recent Labs  Lab 09/11/20 0312 09/12/20 1020 09/13/20 0438  AST 12* 13* 13*  ALT 11 12 12   ALKPHOS 70 78 70  BILITOT 0.3 0.6 0.7  PROT 7.0 7.5 7.0  ALBUMIN 3.7 3.8 3.5   No results for input(s): LIPASE, AMYLASE in the last 168 hours. No results for input(s): AMMONIA in the  last 168 hours. Coagulation Profile: Recent Labs  Lab 09/11/20 0312  INR 0.9   CBC: Recent Labs  Lab 09/11/20 0312 09/12/20 1020 09/13/20 0438 09/14/20 0330  WBC 15.3* 18.7* 14.0* 14.2*  NEUTROABS 12.3*  --   --   --   HGB 13.2 14.3 13.8 13.7  HCT 40.0 42.8 41.6 41.8  MCV 98.5 97.5 97.4 98.1  PLT 276 323 298 325   Cardiac Enzymes: No results for input(s): CKTOTAL, CKMB, CKMBINDEX, TROPONINI in the last 168 hours. BNP: Invalid input(s): POCBNP CBG: Recent Labs  Lab 09/11/20 0346  GLUCAP 119*   HbA1C: Recent Labs    09/12/20 0410  HGBA1C 6.4*   Urine analysis:    Component Value Date/Time   COLORURINE YELLOW 09/11/2020 1110   APPEARANCEUR CLEAR 09/11/2020 1110   LABSPEC >1.046 (H) 09/11/2020 1110   PHURINE 5.0 09/11/2020 1110   GLUCOSEU NEGATIVE 09/11/2020 1110   HGBUR NEGATIVE 09/11/2020 1110   BILIRUBINUR NEGATIVE 09/11/2020 1110   KETONESUR NEGATIVE 09/11/2020 1110   PROTEINUR 30 (A) 09/11/2020 1110   NITRITE NEGATIVE 09/11/2020 1110  LEUKOCYTESUR NEGATIVE 09/11/2020 1110   Sepsis Labs: (procalcitonin:4,lacticidven:4) ) Recent Results (from the past 240 hour(s))  Resp Panel by RT-PCR (Flu A&B, Covid) Nasopharyngeal Swab     Status: None   Collection Time: 09/11/20  3:07 AM   Specimen: Nasopharyngeal Swab; Nasopharyngeal(NP) swabs in vial transport medium  Result Value Ref Range Status   SARS Coronavirus 2 by RT PCR NEGATIVE NEGATIVE Final    Comment: (NOTE) SARS-CoV-2 target nucleic acids are NOT DETECTED.  The SARS-CoV-2 RNA is generally detectable in upper respiratory specimens during the acute phase of infection. The lowest concentration of SARS-CoV-2 viral copies this assay can detect is 138 copies/mL. A negative result does not preclude SARS-Cov-2 infection and should not be used as the sole basis for treatment or other patient management decisions. A negative result may occur with  improper specimen collection/handling,  submission of specimen other than nasopharyngeal swab, presence of viral mutation(s) within the areas targeted by this assay, and inadequate number of viral copies(<138 copies/mL). A negative result must be combined with clinical observations, patient history, and epidemiological information. The expected result is Negative.  Fact Sheet for Patients:  BloggerCourse.com  Fact Sheet for Healthcare Providers:  SeriousBroker.it  This test is no t yet approved or cleared by the [place] FDA and  has been authorized for detection and/or diagnosis of SARS-CoV-2 by FDA under an Emergency Use Authorization (EUA). This EUA will remain  in effect (meaning this test can be used) for the duration of the COVID-19 declaration under Section 564(b)(1) of the Act, 21 U.S.C.section 360bbb-3(b)(1), unless the authorization is terminated  or revoked sooner.       Influenza A by PCR NEGATIVE NEGATIVE Final   Influenza B by PCR NEGATIVE NEGATIVE Final    Comment: (NOTE) The Xpert Xpress SARS-CoV-2/FLU/RSV plus assay is intended as an aid in the diagnosis of influenza from Nasopharyngeal swab specimens and should not be used as a sole basis for treatment. Nasal washings and aspirates are unacceptable for Xpert Xpress SARS-CoV-2/FLU/RSV testing.  Fact Sheet for Patients: BloggerCourse.com  Fact Sheet for Healthcare Providers: SeriousBroker.it  This test is not yet approved or cleared by the [place] FDA and has been authorized for detection and/or diagnosis of SARS-CoV-2 by FDA under an Emergency Use Authorization (EUA). This EUA will remain in effect (meaning this test can be used) for the duration of the COVID-19 declaration under Section 564(b)(1) of the Act, 21 U.S.C. section 360bbb-3(b)(1), unless the authorization is terminated or revoked.  Performed at [facility], [address] [place], [place] [address]   MRSA Next Gen by PCR, Nasal     Status: None   Collection Time: 09/11/20 11:15 AM   Specimen: Nasal Mucosa; Nasal Swab  Result Value Ref Range Status   MRSA by PCR Next Gen NOT DETECTED NOT DETECTED Final    Comment: (NOTE) The GeneXpert MRSA Assay (FDA approved for NASAL specimens only), is one component of a comprehensive MRSA colonization surveillance program. It is not intended to diagnose MRSA infection nor to guide or monitor treatment for MRSA infections. Test performance is not FDA approved in patients less than 59 years old. Performed at [facility], [address] [place], [place] [address]      Scheduled Meds:  arformoterol  15 mcg Nebulization BID   aspirin  300 mg Rectal Daily   budesonide (PULMICORT) nebulizer solution  0.5 mg Nebulization BID   carbamazepine  400 mg Oral BID   Chlorhexidine Gluconate Cloth  6 each Topical Daily   enoxaparin (LOVENOX) injection  40 mg Subcutaneous Q24H   escitalopram  10 mg Oral Daily   fenofibrate  54 mg Oral Daily   ipratropium-albuterol  3 mL Nebulization Q6H   methylPREDNISolone (SOLU-MEDROL) injection  60 mg Intravenous Q12H   nicotine  7 mg Transdermal Daily   rosuvastatin  20 mg Oral Daily   Continuous Infusions:  sodium chloride     azithromycin Stopped (09/14/20 0954)   piperacillin-tazobactam (ZOSYN)  IV 12.5 mL/hr at 09/14/20 1058    Procedures/Studies: CT ABDOMEN WO CONTRAST  Result Date: 09/13/2020 CLINICAL DATA:  Dysphagia, preop planning for gastrostomy placement EXAM: CT ABDOMEN WITHOUT CONTRAST TECHNIQUE: Multidetector CT imaging of the abdomen was performed following the standard protocol without IV contrast. COMPARISON:  10/29/2019 FINDINGS: Lower chest: Dense consolidation in the visualized left lower lung. Patchy somewhat nodular airspace opacities in the right lower lung, new since previous. Hepatobiliary: Partially calcified gallstones measuring up to 10 mm in the nondilated  gallbladder. No evident liver lesion or biliary ductal dilatation. Pancreas: Unremarkable. No pancreatic ductal dilatation or surrounding inflammatory changes. Spleen: Normal in size without focal abnormality. Adrenals/Urinary Tract: Adrenal glands are unremarkable. Kidneys are normal, without renal calculi, focal lesion, or hydronephrosis. Stomach/Bowel: The stomach is incompletely distended. Metallic clip near the duodenal bulb. Visualized portions of small bowel and colon are nondilated, unremarkable. Vascular/Lymphatic: Scattered calcified aortoiliac plaque without aneurysm. Other: No ascites.  No free air. Musculoskeletal: Left tenth and eleventh rib fracture deformities. No acute finding. IMPRESSION: 1. There is safe percutaneous window for gastrostomy placement. 2. Left lower lung consolidation. Patchy nodular densities in the right lung base possibly infectious/inflammatory. 3. Cholelithiasis 4.  Aortic Atherosclerosis (ICD10-170.0). Electronically Signed   By: Corlis Leak M.D.   On: 09/13/2020 07:01   MR ANGIO HEAD WO CONTRAST  Result Date: 09/11/2020 CLINICAL DATA:  Neuro deficit, acute, stroke suspected. Additional history provided by technologist: Altered mental status. EXAM: MRI HEAD WITHOUT CONTRAST MRA HEAD WITHOUT CONTRAST TECHNIQUE: Multiplanar, multi-echo pulse sequences of the brain and surrounding structures were acquired without intravenous contrast. Angiographic images of the Circle of Willis were acquired using MRA technique without intravenous contrast. COMPARISON:  Noncontrast head CT and CT angiogram head/neck 09/11/2020. MRI brain and MRA head 07/25/2020. FINDINGS: MRI HEAD FINDINGS Brain: The examination was prematurely terminated due to the patient's altered mental status and excessive motion degradation. Only axial and coronal diffusion-weighted imaging, a sagittal T1 weighted sequence, and an axial T2 TSE sequence were obtained. The acquired sequences are motion degraded. Most  notably, there is severe motion degradation of the sagittal T1 weighted sequence and axial T2 TSE sequence. Mild generalized parenchymal atrophy. Two adjacent acute infarcts within the cingulate gyrus/callosal body measuring up to 8 mm. Restricted diffusion within the posterior limb of right internal capsule, increased in extent as compared to the brain MRI of 07/25/2020. This may reflect persistent restricted diffusion at site of a subacute infarction, or an acute on subacute infarct. 6 mm acute infarct within the right parietal lobe subcortical white matter (series 5, image 20). Questionable punctate acute infarct within the left frontal lobe subcortical white matter (series 5, image 21). Known chronic cortical/subcortical infarcts within the high right frontal lobe and left temporal occipital lobes. Incompletely assessed severe chronic small vessel ischemic changes within the cerebral white matter. Known chronic right basal ganglia lacunar infarct. Within described limitations, no intracranial mass or extra-axial fluid collection is identified. No midline shift. Vascular: Flow voids poorly assessed due  to the degree of motion degradation. Skull and upper cervical spine: Within described limitations, no focal suspicious marrow lesion is identified. Sinuses/Orbits: No acute orbital abnormality is identified. Mild mucosal thickening within the left maxillary sinus. MRA HEAD FINDINGS The examination is significantly motion degraded, precluding adequate evaluation for intracranial arterial stenoses. No proximal arterial occlusion is identified within the intracranial internal carotid arteries, M1 middle cerebral arteries, proximal anterior cerebral arteries, intracranial vertebral arteries, basilar artery or proximal posterior cerebral arteries. The examination is the nondiagnostic more distally. 2 mm inferiorly projecting vascular protrusion arising from the cavernous left ICA, likely reflecting an aneurysm (series  1038, image 18). IMPRESSION: MRI brain: 1. Prematurely terminated and significantly motion degraded examination, as described and limiting evaluation. 2. Two adjacent acute infarcts within the right cingulate gyrus/callosal body (right ACA vascular territory). 3. Restricted diffusion within the posterior limb of right internal capsule, increased in extent as compared to the brain MRI of 07/25/2020. This may reflect persistent restricted diffusion at site of a known subacute infarct, or an acute on subacute infarct. 4. 6 mm acute infarct within the right parietal lobe subcortical white matter. 5. Questionable punctate acute infarct within the left frontal lobe subcortical white matter. 6. Given infarcts involving multiple vascular territories, consider an embolic process. 7. Known chronic cortical/subcortical infarcts within the high right frontal lobe and left occipital temporal lobes. 8. Incompletely assessed severe chronic small vessel ischemic changes within the cerebral white matter. 9. Known chronic right basal ganglia lacunar infarct. 10. Mild generalized parenchymal atrophy. MRA head: 1. Significantly motion degraded and limited examination, as described. 2. No proximal large vessel occlusion identified within the intracranial internal carotid arteries, M1 middle cerebral arteries, proximal anterior cerebral arteries, intracranial vertebral arteries, basilar artery or proximal posterior cerebral arteries. The examination is non-diagnostic more distally. 3. 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 Electronically Signed   By: Jackey Loge DO   On: 09/11/2020 11:27   MR BRAIN WO CONTRAST  Result Date: 09/11/2020 CLINICAL DATA:  Neuro deficit, acute, stroke suspected. Additional history provided by technologist: Altered mental status. EXAM: MRI HEAD WITHOUT CONTRAST MRA HEAD WITHOUT CONTRAST TECHNIQUE: Multiplanar, multi-echo pulse  sequences of the brain and surrounding structures were acquired without intravenous contrast. Angiographic images of the Circle of Willis were acquired using MRA technique without intravenous contrast. COMPARISON:  Noncontrast head CT and CT angiogram head/neck 09/11/2020. MRI brain and MRA head 07/25/2020. FINDINGS: MRI HEAD FINDINGS Brain: The examination was prematurely terminated due to the patient's altered mental status and excessive motion degradation. Only axial and coronal diffusion-weighted imaging, a sagittal T1 weighted sequence, and an axial T2 TSE sequence were obtained. The acquired sequences are motion degraded. Most notably, there is severe motion degradation of the sagittal T1 weighted sequence and axial T2 TSE sequence. Mild generalized parenchymal atrophy. Two adjacent acute infarcts within the cingulate gyrus/callosal body measuring up to 8 mm. Restricted diffusion within the posterior limb of right internal capsule, increased in extent as compared to the brain MRI of 07/25/2020. This may reflect persistent restricted diffusion at site of a subacute infarction, or an acute on subacute infarct. 6 mm acute infarct within the right parietal lobe subcortical white matter (series 5, image 20). Questionable punctate acute infarct within the left frontal lobe subcortical white matter (series 5, image 21). Known chronic cortical/subcortical infarcts within the high right frontal lobe and left temporal occipital lobes. Incompletely assessed severe chronic small vessel ischemic changes  within the cerebral white matter. Known chronic right basal ganglia lacunar infarct. Within described limitations, no intracranial mass or extra-axial fluid collection is identified. No midline shift. Vascular: Flow voids poorly assessed due to the degree of motion degradation. Skull and upper cervical spine: Within described limitations, no focal suspicious marrow lesion is identified. Sinuses/Orbits: No acute orbital  abnormality is identified. Mild mucosal thickening within the left maxillary sinus. MRA HEAD FINDINGS The examination is significantly motion degraded, precluding adequate evaluation for intracranial arterial stenoses. No proximal arterial occlusion is identified within the intracranial internal carotid arteries, M1 middle cerebral arteries, proximal anterior cerebral arteries, intracranial vertebral arteries, basilar artery or proximal posterior cerebral arteries. The examination is the nondiagnostic more distally. 2 mm inferiorly projecting vascular protrusion arising from the cavernous left ICA, likely reflecting an aneurysm (series 1038, image 18). IMPRESSION: MRI brain: 1. Prematurely terminated and significantly motion degraded examination, as described and limiting evaluation. 2. Two adjacent acute infarcts within the right cingulate gyrus/callosal body (right ACA vascular territory). 3. Restricted diffusion within the posterior limb of right internal capsule, increased in extent as compared to the brain MRI of 07/25/2020. This may reflect persistent restricted diffusion at site of a known subacute infarct, or an acute on subacute infarct. 4. 6 mm acute infarct within the right parietal lobe subcortical white matter. 5. Questionable punctate acute infarct within the left frontal lobe subcortical white matter. 6. Given infarcts involving multiple vascular territories, consider an embolic process. 7. Known chronic cortical/subcortical infarcts within the high right frontal lobe and left occipital temporal lobes. 8. Incompletely assessed severe chronic small vessel ischemic changes within the cerebral white matter. 9. Known chronic right basal ganglia lacunar infarct. 10. Mild generalized parenchymal atrophy. MRA head: 1. Significantly motion degraded and limited examination, as described. 2. No proximal large vessel occlusion identified within the intracranial internal carotid arteries, M1 middle cerebral  arteries, proximal anterior cerebral arteries, intracranial vertebral arteries, basilar artery or proximal posterior cerebral arteries. The examination is non-diagnostic more distally. 3. 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 Electronically Signed   By: Jackey Loge DO   On: 09/11/2020 11:27   DG CHEST PORT 1 VIEW  Result Date: 09/11/2020 CLINICAL DATA:  Respiratory distress EXAM: PORTABLE CHEST 1 VIEW COMPARISON:  Radiograph 09/11/2020, chest CT 11/03/2019 FINDINGS: Unchanged cardiomediastinal silhouette. There are bibasilar airspace opacities. There is no large pleural effusion or visible pneumothorax. There is no acute osseous abnormality. IMPRESSION: Bibasilar airspace opacities, which could represent aspiration/pneumonia. Electronically Signed   By: Caprice Renshaw   On: 09/11/2020 18:59   DG Chest Portable 1 View  Result Date: 09/11/2020 CLINICAL DATA:  Shortness of breath. EXAM: PORTABLE CHEST 1 VIEW COMPARISON:  08/29/2020. FINDINGS: Mediastinum hilar structures normal. Heart size normal. Left lung base infiltrate consistent with pneumonia. Bibasilar atelectasis. No pleural effusion or pneumothorax. Degenerative change thoracic spine. IMPRESSION: Left lung base infiltrate consistent with pneumonia. Bibasilar atelectasis. Electronically Signed   By: Maisie Fus  Register   On: 09/11/2020 05:56   DG Swallowing Func-Speech Pathology  Result Date: 09/12/2020 Formatting of this result is different from the original. Objective Swallowing Evaluation: Type of Study: MBS-Modified Barium Swallow Study  Patient Details Name: DVANTE HANDS MRN: [id number] Date of Birth: Nov 11, 1965 Today's Date: 09/12/2020 Time: SLP Start Time (ACUTE ONLY): 1417 -SLP Stop Time (ACUTE ONLY): 1456 SLP Time Calculation (min) (ACUTE ONLY): 39 min Past Medical History: Past Medical History: Diagnosis Date  Aplastic anemia, unspecified (  HCC)   Chest pain,  unspecified   Chronic airway obstruction, not elsewhere classified   Cocaine substance abuse (HCC)   Neuropathy   Other and unspecified hyperlipidemia   Shortness of breath   Stroke (HCC) 06/17/2020  Tobacco use disorder   Type II or unspecified type diabetes mellitus without mention of complication, not stated as uncontrolled   Unspecified epilepsy without mention of intractable epilepsy   Unspecified essential hypertension  Past Surgical History: Past Surgical History: Procedure Laterality Date  CARDIAC CATHETERIZATION  10/02/2010   Nonobstructive mild coronary plaque  KNEE SURGERY Bilateral 2018  Repair  PERFORATED VISCUS SURGERY    MULTIPLE FRACTURES HPI: 55 yo RH M with h/o HTN, HOLD, cocaine use, aplastic anemia, epilepsy, who presented via EMS to the ED tonight with SOB and then was noted to have L hemiparesis and significant dysarthria. Unclear time of onset. Not mentioned by EMS or on initial triage. Pt reports it happened since he got here. However had same symptoms (but less so) with last stroke a month ago and also family reported a couple of days of slurred speech. 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

## 2020-09-14 NOTE — Progress Notes (Signed)
RN Called this RT back to patient's room.  Patient was having another desat episode and was already on Salter 11L.  RN informed this RT that Dr. Arbutus Leas wanted an ABG on patient.  I informed RN to place the NRB on patient over the Salter cannula, and I let her know that patient had this same episode last night with the 3rd shift RT.  Performed ABG, took to lab, and now I am doing CPT in bed at this time.  Patient's sats have been ranging from 85 to 87% at this time.

## 2020-09-14 NOTE — Progress Notes (Signed)
Re-evaluated patient after ABG  ABG reviewed--7.432/44/43/28 on 80%  Patient is comfortable without increase WOB.   Denies cp, n/v Remains on NRB and Salter Personally reviewed CXR--volume loss on left lung with shift and increased opacity--suspect mucus plug  -chest physiotherapy started -start mucomyst  Around 645PM, oxygen saturation improving up to after physiotherapy and some suctioning -start mucomyst -chest physiotherapy -currently VS-- HR 66--RR18--154/84--100% saturation on salter and NRB  DTat, DO

## 2020-09-15 ENCOUNTER — Inpatient Hospital Stay (HOSPITAL_COMMUNITY): Payer: Medicaid Other

## 2020-09-15 DIAGNOSIS — J9621 Acute and chronic respiratory failure with hypoxia: Secondary | ICD-10-CM

## 2020-09-15 DIAGNOSIS — R0603 Acute respiratory distress: Secondary | ICD-10-CM | POA: Diagnosis not present

## 2020-09-15 DIAGNOSIS — R531 Weakness: Secondary | ICD-10-CM

## 2020-09-15 DIAGNOSIS — F1410 Cocaine abuse, uncomplicated: Secondary | ICD-10-CM | POA: Diagnosis not present

## 2020-09-15 DIAGNOSIS — J441 Chronic obstructive pulmonary disease with (acute) exacerbation: Secondary | ICD-10-CM | POA: Diagnosis not present

## 2020-09-15 DIAGNOSIS — J690 Pneumonitis due to inhalation of food and vomit: Secondary | ICD-10-CM | POA: Diagnosis not present

## 2020-09-15 LAB — BASIC METABOLIC PANEL
Anion gap: 10 (ref 5–15)
BUN: 29 mg/dL — ABNORMAL HIGH (ref 6–20)
CO2: 28 mmol/L (ref 22–32)
Calcium: 9.4 mg/dL (ref 8.9–10.3)
Chloride: 107 mmol/L (ref 98–111)
Creatinine, Ser: 0.86 mg/dL (ref 0.61–1.24)
GFR, Estimated: >60 mL/min (ref >60)
Glucose, Bld: 148 mg/dL — ABNORMAL HIGH (ref 70–99)
Potassium: 3.9 mmol/L (ref 3.5–5.1)
Sodium: 145 mmol/L (ref 135–145)

## 2020-09-15 LAB — CBC
HCT: 44 % (ref 39.0–52.0)
Hemoglobin: 14.3 g/dL (ref 13.0–17.0)
MCH: 32 pg (ref 26.0–34.0)
MCHC: 32.5 g/dL (ref 30.0–36.0)
MCV: 98.4 fL (ref 80.0–100.0)
Platelets: 315 10*3/uL (ref 150–400)
RBC: 4.47 MIL/uL (ref 4.22–5.81)
RDW: 12.8 % (ref 11.5–15.5)
WBC: 14.4 10*3/uL — ABNORMAL HIGH (ref 4.0–10.5)
nRBC: 0 % (ref 0.0–0.2)

## 2020-09-15 LAB — GLUCOSE, CAPILLARY
Glucose-Capillary: 141 mg/dL — ABNORMAL HIGH (ref 70–99)
Glucose-Capillary: 164 mg/dL — ABNORMAL HIGH (ref 70–99)
Glucose-Capillary: 164 mg/dL — ABNORMAL HIGH (ref 70–99)
Glucose-Capillary: 193 mg/dL — ABNORMAL HIGH (ref 70–99)

## 2020-09-15 LAB — PHOSPHORUS: Phosphorus: 2.7 mg/dL (ref 2.5–4.6)

## 2020-09-15 LAB — MAGNESIUM: Magnesium: 1.8 mg/dL (ref 1.7–2.4)

## 2020-09-15 IMAGING — DX DG CHEST 1V PORT
1 series · 1 of 1 positions shown · non-contrast
Comparison: [DATE]

CLINICAL DATA: Acute on chronic respiratory failure.

EXAM:
PORTABLE CHEST 1 VIEW

[chest ap grid]
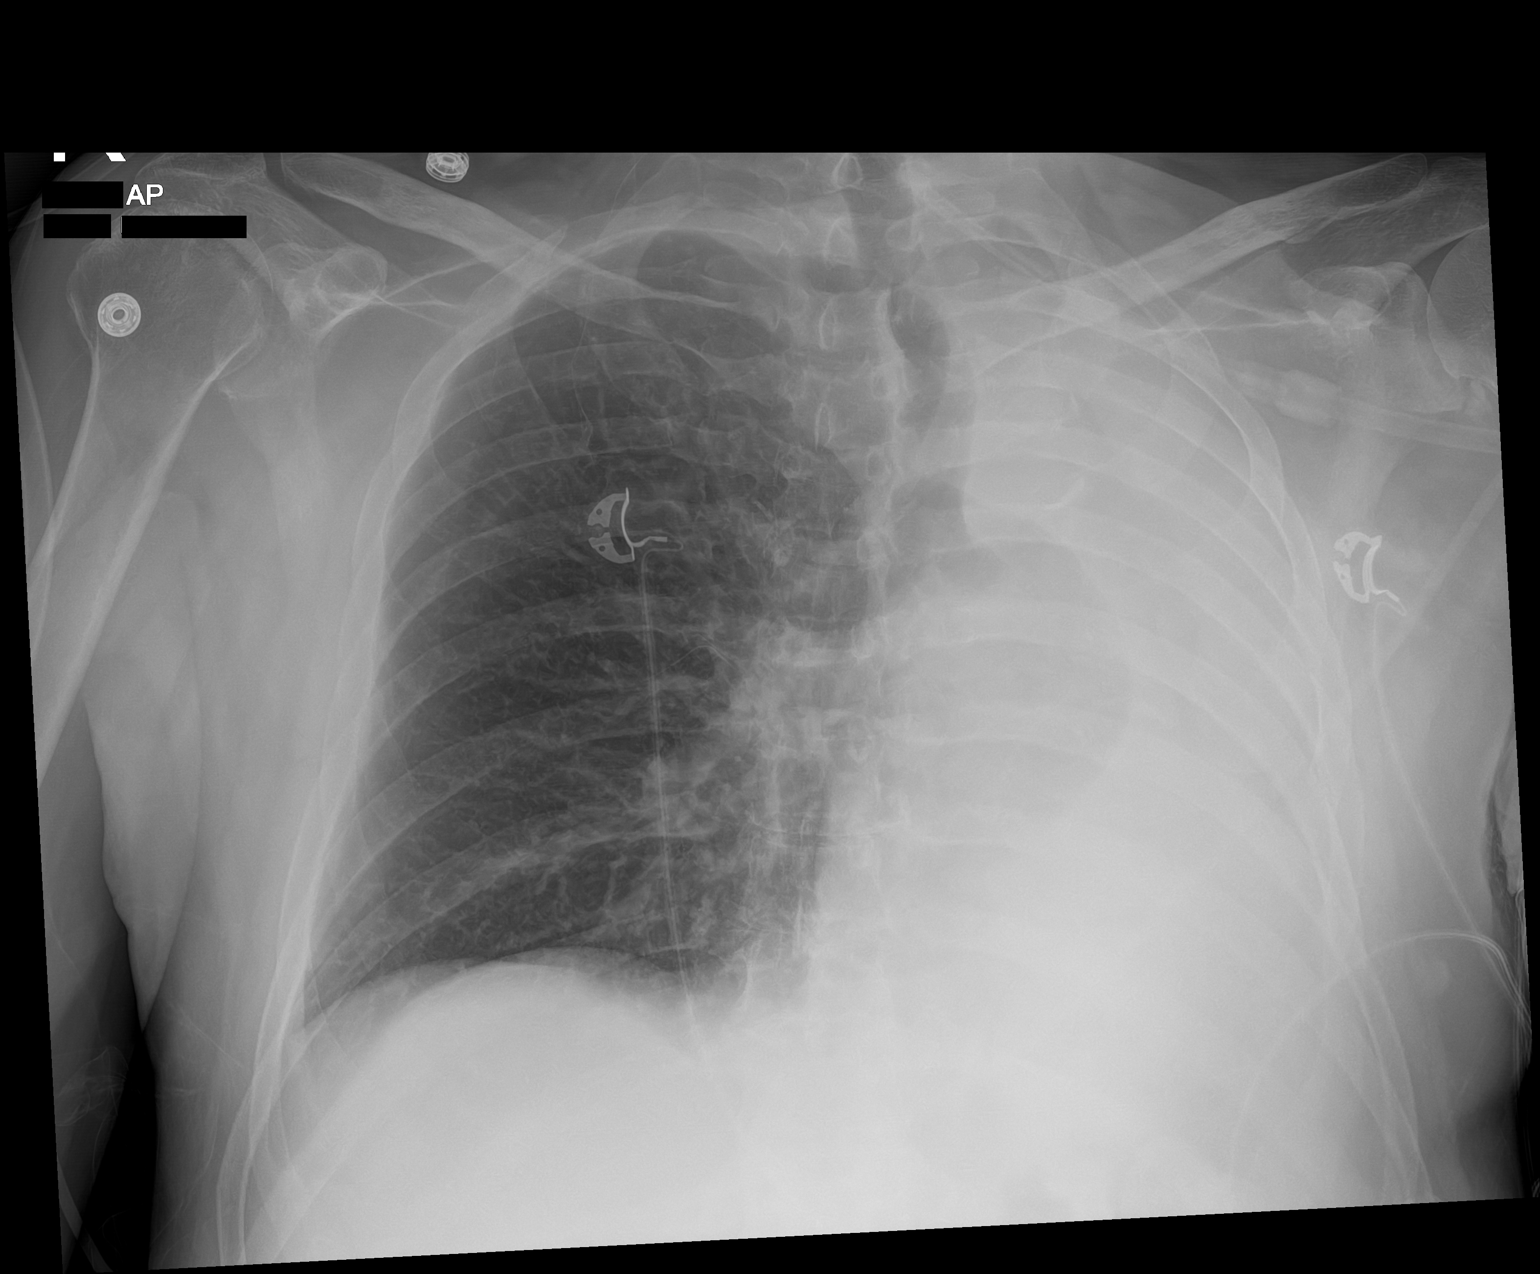

[1 of 1 positions shown; findings below may reference images not displayed]

FINDINGS: There is now complete opacification of the left hemithorax likely
consistent with complete atelectasis of the left lung. Left pleural
effusion not excluded. There is of abrupt termination of the
proximal left mainstem bronchus which may represent obstructing mass
or mucous plugging. There is shift of the mediastinum leftward.
Right lung clear.
IMPRESSION: 1. Complete opacification of the left hemithorax, likely secondary
to complete atelectasis of the left lung. Superimposed left pleural
effusion not excluded.
2. There is abrupt termination of the proximal left mainstem
bronchus which may represent obstructing mass or mucous plugging.
Consider further evaluation with CT of the chest with IV contrast
material.

## 2020-09-15 MED ORDER — ORAL CARE MOUTH RINSE
15.0000 mL | Freq: Two times a day (BID) | OROMUCOSAL | Status: DC
  Administered 2020-09-15 – 2020-09-27 (×24): 15 mL via OROMUCOSAL

## 2020-09-15 MED ORDER — IPRATROPIUM-ALBUTEROL 0.5-2.5 (3) MG/3ML IN SOLN
3.0000 mL | RESPIRATORY_TRACT | Status: DC
  Administered 2020-09-15 – 2020-09-25 (×56): 3 mL via RESPIRATORY_TRACT
  Filled 2020-09-15 (×55): qty 3

## 2020-09-15 MED ORDER — METHYLPREDNISOLONE SODIUM SUCC 40 MG IJ SOLR
40.0000 mg | Freq: Two times a day (BID) | INTRAMUSCULAR | Status: DC
  Administered 2020-09-15 – 2020-09-17 (×4): 40 mg via INTRAVENOUS
  Filled 2020-09-15 (×4): qty 1

## 2020-09-15 MED ORDER — INSULIN ASPART 100 UNIT/ML IJ SOLN: 0.0000 [IU] | Freq: Three times a day (TID) | INTRAMUSCULAR | Status: DC

## 2020-09-15 MED ORDER — ACETYLCYSTEINE 20 % IN SOLN
RESPIRATORY_TRACT | Status: AC
  Administered 2020-09-15: 800 mg
  Filled 2020-09-15: qty 4

## 2020-09-15 MED ORDER — DIPHENHYDRAMINE HCL 50 MG/ML IJ SOLN
12.5000 mg | Freq: Once | INTRAMUSCULAR | Status: AC | PRN
  Administered 2020-09-16: 12.5 mg via INTRAVENOUS
  Filled 2020-09-15: qty 1

## 2020-09-15 MED ORDER — SODIUM CHLORIDE 3 % IN NEBU
4.0000 mL | INHALATION_SOLUTION | Freq: Two times a day (BID) | RESPIRATORY_TRACT | Status: AC
  Administered 2020-09-15 – 2020-09-16 (×4): 4 mL via RESPIRATORY_TRACT
  Administered 2020-09-17: 15 mL via RESPIRATORY_TRACT
  Administered 2020-09-18: 4 mL via RESPIRATORY_TRACT
  Filled 2020-09-15 (×6): qty 4

## 2020-09-15 MED ORDER — ALBUTEROL SULFATE (2.5 MG/3ML) 0.083% IN NEBU
INHALATION_SOLUTION | RESPIRATORY_TRACT | Status: AC
  Administered 2020-09-15: 2.5 mg
  Filled 2020-09-15: qty 3

## 2020-09-15 MED ORDER — INSULIN ASPART 100 UNIT/ML IJ SOLN
0.0000 [IU] | INTRAMUSCULAR | Status: DC
  Administered 2020-09-15 – 2020-09-16 (×4): 3 [IU] via SUBCUTANEOUS
  Administered 2020-09-16: 5 [IU] via SUBCUTANEOUS
  Administered 2020-09-16: 2 [IU] via SUBCUTANEOUS
  Administered 2020-09-16 (×2): 3 [IU] via SUBCUTANEOUS
  Administered 2020-09-17: 5 [IU] via SUBCUTANEOUS
  Administered 2020-09-17: 8 [IU] via SUBCUTANEOUS

## 2020-09-15 MED ORDER — CHLORHEXIDINE GLUCONATE 0.12 % MT SOLN
15.0000 mL | Freq: Two times a day (BID) | OROMUCOSAL | Status: DC
  Administered 2020-09-15 – 2020-09-27 (×24): 15 mL via OROMUCOSAL
  Filled 2020-09-15 (×16): qty 15

## 2020-09-15 NOTE — Progress Notes (Signed)
Pt refused Pulmicort treatment after Brovana and Duoneb. Pt states, " Y'all are going to have to put me to sleep." RN contacted and CCM. RT will cont to monitor

## 2020-09-15 NOTE — Progress Notes (Signed)
Patient did not sleep at all during night shift. Patient began to desat and was placed on Hiflow and Non-rebreather to maintain sats. Sat now 96%. Patient turned throughout shift for comfort. Oral suction provided.

## 2020-09-15 NOTE — Progress Notes (Signed)
PROGRESS NOTE  Brent [name] ZOX:[id number] DOB: [birth year] DOA: 09/11/2020 PCP: Beatrix Fetters, MD   Brief History:  55 y.o. male with medical history of hypertension, hyperlipidemia, diabetes mellitus type 2, cocaine abuse, tobacco abuse, recent stroke presented with 2 to 3-day history of slurred speech, left hemiparesis, and shortness of breath.  The patient is a poor historian.  History is taken from review of the medical record and speaking with the patient's significant other.  Patient has been having worsening shortness of breath and coughing for the past 2 to 3 days.  There is no fevers, chills, hemoptysis, nausea, vomiting, diarrhea, chest pain.  He continues to smoke cigarettes.  He last smoked cocaine on 09/09/2020.  He denies any headache, visual disturbance.  He states that his left hemiparesis is about the same as usual.  There is no abdominal pain, dysuria, hematuria. In the emergency department, the patient was afebrile.  He was tachycardic up to 105.  Oxygen saturation was 90-92% on 3 L.  CXR showed bibasilar opacities.  He was started on zosyn for aspiration pneumonia.  Neurology was consulted to assist with management.  Neurology recommended DAPT, but plavix has been held in preparation for PEG tube placement.  The patient continued to struggle with aspiration, weak cough and hypoxia.  He developed mucus plugging and worsening hypoxia and did not improve with conservative measures.  PCCM was consulted and recommended transfer to [facility].     Assessment/Plan: Acute respiratory failure with hypoxia -due to aspiration pneumonia and COPD exacerbation -Currently on 10 L >>15L HFNC>>11L -goal is saturation of 90% and above -09/14/20--desaturated into 70's due to mucus plugging>>started mucomyst/hypertonic saline, chest physiotherapy -09/15/20--personally reviewed CXR--total opacification of L-chest -remains on [place] 15L and NRB--but no increase WOB -discussed with  PCCM--Dr. McQuaid>>transfer to [facility] for bronchoscopy   Aspiration pneumonia -Continue IV Zosyn -Speech therapy evaluation-->not safe for po intake -patient is very impulse and has difficulty following instructions regarding meals/eating -7/27 evening--had another aspirational event due to impulsivness   COPD exacerbation -Continue Pulmicort -Continue duo nebs -Continue  IV Solu-Medrol -Continue Brovana   Acute Ischemic/Embolic Stroke -presented with Left Hemiparesis/Dysarthria -CTA head and neck--negative for LVO; 55% left carotid, 35% right carotid stenosis -MRI brain--2 acute infarcts R-cingulate gyrus; acute/subacute infart R-internal capsule and R-parietal lobe -CT perfusion brain--no acute core infarct, 7 cc area of delayed perfusion in the right posterior temporal occipital lobe -Continue aspirin--plavix on hold in preparation for PEG -PT eval>>CIR recommended -LDL 56 -07/26/20 A1C--7.0 -appreciate neurology consult>>continue DAPT -plavix on hold while waiting for PEG -discussed with IR--PEG tentative scheduled for mon/tues   Sepsis -due to pneumonia -present on admission -start IVF>>saline locked -check lactic--1.2 -check PCT <0.10 -Continue IV zosyn -sepsis physiology resolved   Polysubstance abuse -Including cocaine and tobacco -Last used cocaine 09/09/2020 -Cessation discussed   Diabetes mellitus type 2 -NovoLog sliding scale -holding Janumet -07/26/20 A1c--7.0 -09/12/20 A1C--6.4 -CBGs controlled   Essential hypertension -Allow permissive hypertension at this time -holding benazepril, metoprolol temporarily   Hyperlipidemia -Continue Crestor and fenofibrate   Chronic back pain -Continue gabapentin   Goals of Care -discussed with mother and significant other-->confirm full code -Advance care planning, including the explanation and discussion of advance directives was carried out with the patient and family.  Code status including explanations of "Full  Code" and "DNR" and alternatives were discussed in detail.  Discussion of end-of-life issues including but not limited palliative care, hospice care  and the concept of hospice, other end-of-life care options, power of attorney for health care decisions, living wills, and physician orders for life-sustaining treatment were also discussed with the patient and family.  Total face to face time 20 minutes. -09/15/20--family updated and agrees with transfer to [facility]    Subjective: Patient denies fevers, chills, headache, chest pain, dyspnea, nausea, vomiting, diarrhea, abdominal pain, dysuria, hematuria, hematochezia, and melena.   Objective: Vitals:   09/15/20 0800 09/15/20 0831 09/15/20 0900 09/15/20 1000  BP: (!) 150/97  140/80 (!) 189/117  Pulse: 88 91 93 (!) 105  Resp: (!) 25  Temp:      TempSrc:      SpO2: (!) 88% 90% 90% 99%  Weight:      Height:        Intake/Output Summary (Last 24 hours) at 09/15/2020 1031 Last data filed at 09/15/2020 0422 Gross per 24 hour  Intake 399.93 ml  Output 350 ml  Net 49.93 ml   Weight change: -2.6 kg Exam:  General:  Pt is alert, follows commands appropriately, not in acute distress HEENT: No icterus, No thrush, No neck mass, [place]/AT Cardiovascular: RRR, S1/S2, no rubs, no gallops Respiratory: bilateral rales.  Diminished BS on left Abdomen: Soft/+BS, non tender, non distended, no guarding Extremities: No edema, No lymphangitis, No petechiae, No rashes, no synovitis   Data Reviewed: I have personally reviewed following labs and imaging studies Basic Metabolic Panel: Recent Labs  Lab 09/11/20 0312 09/12/20 1020 09/13/20 0438 09/14/20 0330 09/15/20 0449  NA 140 139 141 142 145  K 3.8 4.1 3.6 3.9 3.9  CL 104 103 104 104 107  CO2 GLUCOSE 135* 197* 137* 142* 148*  BUN 16 20 22* 25* 29*  CREATININE 0.87 0.90 0.86 0.90 0.86  CALCIUM 9.3 9.2 9.2 9.2 9.4  MG  --   --  1.6* 2.0 1.8  PHOS  --   --   --   --  2.7    Liver Function Tests: Recent Labs  Lab 09/11/20 0312 09/12/20 1020 09/13/20 0438  AST 12* 13* 13*  ALT ALKPHOS 70 78 70  BILITOT 0.3 0.6 0.7  PROT 7.0 7.5 7.0  ALBUMIN 3.7 3.8 3.5   No results for input(s): LIPASE, AMYLASE in the last 168 hours. No results for input(s): AMMONIA in the last 168 hours. Coagulation Profile: Recent Labs  Lab 09/11/20 0312  INR 0.9   CBC: Recent Labs  Lab 09/11/20 0312 09/12/20 1020 09/13/20 0438 09/14/20 0330 09/15/20 0449  WBC 15.3* 18.7* 14.0* 14.2* 14.4*  NEUTROABS 12.3*  --   --   --   --   HGB 13.2 14.3 13.8 13.7 14.3  HCT 40.0 42.8 41.6 41.8 44.0  MCV 98.5 97.5 97.4 98.1 98.4  PLT 276 323 298 325 315   Cardiac Enzymes: No results for input(s): CKTOTAL, CKMB, CKMBINDEX, TROPONINI in the last 168 hours. BNP: Invalid input(s): POCBNP CBG: Recent Labs  Lab 09/11/20 0346  GLUCAP 119*   HbA1C: No results for input(s): HGBA1C in the last 72 hours. Urine analysis:    Component Value Date/Time   COLORURINE YELLOW 09/11/2020 1110   APPEARANCEUR CLEAR 09/11/2020 1110   LABSPEC >1.046 (H) 09/11/2020 1110   PHURINE 5.0 09/11/2020 1110   GLUCOSEU NEGATIVE 09/11/2020 1110   HGBUR NEGATIVE 09/11/2020 1110   BILIRUBINUR NEGATIVE 09/11/2020 1110   KETONESUR NEGATIVE 09/11/2020 1110   PROTEINUR 30 (A) 09/11/2020 1110  NITRITE NEGATIVE 09/11/2020 1110   LEUKOCYTESUR NEGATIVE 09/11/2020 1110   Sepsis Labs: @LABRCNTIP (procalcitonin:4,lacticidven:4) ) Recent Results (from the past 240 hour(s))  Resp Panel by RT-PCR (Flu A&B, Covid) Nasopharyngeal Swab     Status: None   Collection Time: 09/11/20  3:07 AM   Specimen: Nasopharyngeal Swab; Nasopharyngeal(NP) swabs in vial transport medium  Result Value Ref Range Status   SARS Coronavirus 2 by RT PCR NEGATIVE NEGATIVE Final    Comment: (NOTE) SARS-CoV-2 target nucleic acids are NOT DETECTED.  The SARS-CoV-2 RNA is generally detectable in upper respiratory specimens  during the acute phase of infection. The lowest concentration of SARS-CoV-2 viral copies this assay can detect is 138 copies/mL. A negative result does not preclude SARS-Cov-2 infection and should not be used as the sole basis for treatment or other patient management decisions. A negative result may occur with  improper specimen collection/handling, submission of specimen other than nasopharyngeal swab, presence of viral mutation(s) within the areas targeted by this assay, and inadequate number of viral copies(<138 copies/mL). A negative result must be combined with clinical observations, patient history, and epidemiological information. The expected result is Negative.  Fact Sheet for Patients:  09/13/20  Fact Sheet for Healthcare Providers:  BloggerCourse.com  This test is no t yet approved or cleared by the [employer]ious[employer].[place] FDA and  has been authorized for detection and/or diagnosis of SARS-CoV-2 by FDA under an Emergency Use Authorization (EUA). This EUA will remain  in effect (meaning this test can be used) for the duration of the COVID-19 declaration under Section 564(b)(1) of the Act, 21 U.S.C.section 360bbb-3(b)(1), unless the authorization is terminated  or revoked sooner.       Influenza A by PCR NEGATIVE NEGATIVE Final   Influenza B by PCR NEGATIVE NEGATIVE Final    Comment: (NOTE) The Xpert Xpress SARS-CoV-2/FLU/RSV plus assay is intended as an aid in the diagnosis of influenza from Nasopharyngeal swab specimens and should not be used as a sole basis for treatment. Nasal washings and aspirates are unacceptable for Xpert Xpress SARS-CoV-2/FLU/RSV testing.  Fact Sheet for Patients: [place]  Fact Sheet for Healthcare Providers: BloggerCourse.com  This test is not yet approved or cleared by the [employer]ious[employer]ker.it FDA and has been authorized for detection  and/or diagnosis of SARS-CoV-2 by FDA under an Emergency Use Authorization (EUA). This EUA will remain in effect (meaning this test can be used) for the duration of the COVID-19 declaration under Section 564(b)(1) of the Act, 21 U.S.C. section 360bbb-3(b)(1), unless the authorization is terminated or revoked.  Performed at [facility], [address], [place], [place]   MRSA Next Gen by PCR, Nasal     Status: None   Collection Time: 09/11/20 11:15 AM   Specimen: Nasal Mucosa; Nasal Swab  Result Value Ref Range Status   MRSA by PCR Next Gen NOT DETECTED NOT DETECTED Final    Comment: (NOTE) The GeneXpert MRSA Assay (FDA approved for NASAL specimens only), is one component of a comprehensive MRSA colonization surveillance program. It is not intended to diagnose MRSA infection nor to guide or monitor treatment for MRSA infections. Test performance is not FDA approved in patients less than 9 years old. Performed at [facility], [address] [place], [place]      Scheduled Meds:  acetylcysteine  4 mL Nebulization TID   arformoterol  15 mcg Nebulization BID   aspirin  300 mg Rectal Daily   budesonide (PULMICORT) nebulizer solution  0.5 mg Nebulization BID  carbamazepine  400 mg Oral BID   Chlorhexidine Gluconate Cloth  6 each Topical Daily   enoxaparin (LOVENOX) injection  40 mg Subcutaneous Q24H   escitalopram  10 mg Oral Daily   fenofibrate  54 mg Oral Daily   ipratropium-albuterol  3 mL Nebulization Q6H   methylPREDNISolone (SOLU-MEDROL) injection  60 mg Intravenous Q12H   nicotine  7 mg Transdermal Daily   rosuvastatin  20 mg Oral Daily   Continuous Infusions:  sodium chloride 10 mL/hr at 09/15/20 0538   azithromycin 500 mg (09/15/20 0823)   piperacillin-tazobactam (ZOSYN)  IV 3.375 g (09/15/20 0831)    Procedures/Studies: CT ABDOMEN WO CONTRAST  Result Date: 09/13/2020 CLINICAL DATA:  Dysphagia, preop planning for gastrostomy placement EXAM: CT  ABDOMEN WITHOUT CONTRAST TECHNIQUE: Multidetector CT imaging of the abdomen was performed following the standard protocol without IV contrast. COMPARISON:  10/29/2019 FINDINGS: Lower chest: Dense consolidation in the visualized left lower lung. Patchy somewhat nodular airspace opacities in the right lower lung, new since previous. Hepatobiliary: Partially calcified gallstones measuring up to 10 mm in the nondilated gallbladder. No evident liver lesion or biliary ductal dilatation. Pancreas: Unremarkable. No pancreatic ductal dilatation or surrounding inflammatory changes. Spleen: Normal in size without focal abnormality. Adrenals/Urinary Tract: Adrenal glands are unremarkable. Kidneys are normal, without renal calculi, focal lesion, or hydronephrosis. Stomach/Bowel: The stomach is incompletely distended. Metallic clip near the duodenal bulb. Visualized portions of small bowel and colon are nondilated, unremarkable. Vascular/Lymphatic: Scattered calcified aortoiliac plaque without aneurysm. Other: No ascites.  No free air. Musculoskeletal: Left tenth and eleventh rib fracture deformities. No acute finding. IMPRESSION: 1. There is safe percutaneous window for gastrostomy placement. 2. Left lower lung consolidation. Patchy nodular densities in the right lung base possibly infectious/inflammatory. 3. Cholelithiasis 4.  Aortic Atherosclerosis (ICD10-170.0). Electronically Signed   By: Corlis Leak M.D.   On: 09/13/2020 07:01   MR ANGIO HEAD WO CONTRAST  Result Date: 09/11/2020 CLINICAL DATA:  Neuro deficit, acute, stroke suspected. Additional history provided by technologist: Altered mental status. EXAM: MRI HEAD WITHOUT CONTRAST MRA HEAD WITHOUT CONTRAST TECHNIQUE: Multiplanar, multi-echo pulse sequences of the brain and surrounding structures were acquired without intravenous contrast. Angiographic images of the Circle of Willis were acquired using MRA technique without intravenous contrast. COMPARISON:  Noncontrast  head CT and CT angiogram head/neck 09/11/2020. MRI brain and MRA head 07/25/2020. FINDINGS: MRI HEAD FINDINGS Brain: The examination was prematurely terminated due to the patient's altered mental status and excessive motion degradation. Only axial and coronal diffusion-weighted imaging, a sagittal T1 weighted sequence, and an axial T2 TSE sequence were obtained. The acquired sequences are motion degraded. Most notably, there is severe motion degradation of the sagittal T1 weighted sequence and axial T2 TSE sequence. Mild generalized parenchymal atrophy. Two adjacent acute infarcts within the cingulate gyrus/callosal body measuring up to 8 mm. Restricted diffusion within the posterior limb of right internal capsule, increased in extent as compared to the brain MRI of 07/25/2020. This may reflect persistent restricted diffusion at site of a subacute infarction, or an acute on subacute infarct. 6 mm acute infarct within the right parietal lobe subcortical white matter (series 5, image 20). Questionable punctate acute infarct within the left frontal lobe subcortical white matter (series 5, image 21). Known chronic cortical/subcortical infarcts within the high right frontal lobe and left temporal occipital lobes. Incompletely assessed severe chronic small vessel ischemic changes within the cerebral white matter. Known chronic right basal ganglia lacunar infarct. Within described limitations, no intracranial mass  or extra-axial fluid collection is identified. No midline shift. Vascular: Flow voids poorly assessed due to the degree of motion degradation. Skull and upper cervical spine: Within described limitations, no focal suspicious marrow lesion is identified. Sinuses/Orbits: No acute orbital abnormality is identified. Mild mucosal thickening within the left maxillary sinus. MRA HEAD FINDINGS The examination is significantly motion degraded, precluding adequate evaluation for intracranial arterial stenoses. No proximal  arterial occlusion is identified within the intracranial internal carotid arteries, M1 middle cerebral arteries, proximal anterior cerebral arteries, intracranial vertebral arteries, basilar artery or proximal posterior cerebral arteries. The examination is the nondiagnostic more distally. 2 mm inferiorly projecting vascular protrusion arising from the cavernous left ICA, likely reflecting an aneurysm (series 1038, image 18). IMPRESSION: MRI brain: 1. Prematurely terminated and significantly motion degraded examination, as described and limiting evaluation. 2. Two adjacent acute infarcts within the right cingulate gyrus/callosal body (right ACA vascular territory). 3. Restricted diffusion within the posterior limb of right internal capsule, increased in extent as compared to the brain MRI of 07/25/2020. This may reflect persistent restricted diffusion at site of a known subacute infarct, or an acute on subacute infarct. 4. 6 mm acute infarct within the right parietal lobe subcortical white matter. 5. Questionable punctate acute infarct within the left frontal lobe subcortical white matter. 6. Given infarcts involving multiple vascular territories, consider an embolic process. 7. Known chronic cortical/subcortical infarcts within the high right frontal lobe and left occipital temporal lobes. 8. Incompletely assessed severe chronic small vessel ischemic changes within the cerebral white matter. 9. Known chronic right basal ganglia lacunar infarct. 10. Mild generalized parenchymal atrophy. MRA head: 1. Significantly motion degraded and limited examination, as described. 2. No proximal large vessel occlusion identified within the intracranial internal carotid arteries, M1 middle cerebral arteries, proximal anterior cerebral arteries, intracranial vertebral arteries, basilar artery or proximal posterior cerebral arteries. The examination is non-diagnostic more distally. 3. 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 Electronically Signed   By: Jackey Loge DO   On: 09/11/2020 11:27   MR BRAIN WO CONTRAST  Result Date: 09/11/2020 CLINICAL DATA:  Neuro deficit, acute, stroke suspected. Additional history provided by technologist: Altered mental status. EXAM: MRI HEAD WITHOUT CONTRAST MRA HEAD WITHOUT CONTRAST TECHNIQUE: Multiplanar, multi-echo pulse sequences of the brain and surrounding structures were acquired without intravenous contrast. Angiographic images of the Circle of Willis were acquired using MRA technique without intravenous contrast. COMPARISON:  Noncontrast head CT and CT angiogram head/neck 09/11/2020. MRI brain and MRA head 07/25/2020. FINDINGS: MRI HEAD FINDINGS Brain: The examination was prematurely terminated due to the patient's altered mental status and excessive motion degradation. Only axial and coronal diffusion-weighted imaging, a sagittal T1 weighted sequence, and an axial T2 TSE sequence were obtained. The acquired sequences are motion degraded. Most notably, there is severe motion degradation of the sagittal T1 weighted sequence and axial T2 TSE sequence. Mild generalized parenchymal atrophy. Two adjacent acute infarcts within the cingulate gyrus/callosal body measuring up to 8 mm. Restricted diffusion within the posterior limb of right internal capsule, increased in extent as compared to the brain MRI of 07/25/2020. This may reflect persistent restricted diffusion at site of a subacute infarction, or an acute on subacute infarct. 6 mm acute infarct within the right parietal lobe subcortical white matter (series 5, image 20). Questionable punctate acute infarct within the left frontal lobe subcortical white matter (series 5, image 21). Known chronic cortical/subcortical infarcts within the high right  frontal lobe and left temporal occipital lobes. Incompletely assessed severe chronic small vessel ischemic changes within the  cerebral white matter. Known chronic right basal ganglia lacunar infarct. Within described limitations, no intracranial mass or extra-axial fluid collection is identified. No midline shift. Vascular: Flow voids poorly assessed due to the degree of motion degradation. Skull and upper cervical spine: Within described limitations, no focal suspicious marrow lesion is identified. Sinuses/Orbits: No acute orbital abnormality is identified. Mild mucosal thickening within the left maxillary sinus. MRA HEAD FINDINGS The examination is significantly motion degraded, precluding adequate evaluation for intracranial arterial stenoses. No proximal arterial occlusion is identified within the intracranial internal carotid arteries, M1 middle cerebral arteries, proximal anterior cerebral arteries, intracranial vertebral arteries, basilar artery or proximal posterior cerebral arteries. The examination is the nondiagnostic more distally. 2 mm inferiorly projecting vascular protrusion arising from the cavernous left ICA, likely reflecting an aneurysm (series 1038, image 18). IMPRESSION: MRI brain: 1. Prematurely terminated and significantly motion degraded examination, as described and limiting evaluation. 2. Two adjacent acute infarcts within the right cingulate gyrus/callosal body (right ACA vascular territory). 3. Restricted diffusion within the posterior limb of right internal capsule, increased in extent as compared to the brain MRI of 07/25/2020. This may reflect persistent restricted diffusion at site of a known subacute infarct, or an acute on subacute infarct. 4. 6 mm acute infarct within the right parietal lobe subcortical white matter. 5. Questionable punctate acute infarct within the left frontal lobe subcortical white matter. 6. Given infarcts involving multiple vascular territories, consider an embolic process. 7. Known chronic cortical/subcortical infarcts within the high right frontal lobe and left occipital temporal  lobes. 8. Incompletely assessed severe chronic small vessel ischemic changes within the cerebral white matter. 9. Known chronic right basal ganglia lacunar infarct. 10. Mild generalized parenchymal atrophy. MRA head: 1. Significantly motion degraded and limited examination, as described. 2. No proximal large vessel occlusion identified within the intracranial internal carotid arteries, M1 middle cerebral arteries, proximal anterior cerebral arteries, intracranial vertebral arteries, basilar artery or proximal posterior cerebral arteries. The examination is non-diagnostic more distally. 3. 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 Electronically Signed   By: Jackey Loge DO   On: 09/11/2020 11:27   DG CHEST PORT 1 VIEW  Result Date: 09/15/2020 CLINICAL DATA:  Acute on chronic respiratory failure. EXAM: PORTABLE CHEST 1 VIEW COMPARISON:  09/14/2020 FINDINGS: There is now complete opacification of the left hemithorax likely consistent with complete atelectasis of the left lung. Left pleural effusion not excluded. There is of abrupt termination of the proximal left mainstem bronchus which may represent obstructing mass or mucous plugging. There is shift of the mediastinum leftward. Right lung clear. IMPRESSION: 1. Complete opacification of the left hemithorax, likely secondary to complete atelectasis of the left lung. Superimposed left pleural effusion not excluded. 2. There is abrupt termination of the proximal left mainstem bronchus which may represent obstructing mass or mucous plugging. Consider further evaluation with CT of the chest with IV contrast material. Electronically Signed   By: Signa Kell M.D.   On: 09/15/2020 08:04   DG CHEST PORT 1 VIEW  Result Date: 09/14/2020 CLINICAL DATA:  Respiratory distress. EXAM: PORTABLE CHEST 1 VIEW COMPARISON:  Chest radiograph September 11, 2020. FINDINGS: Leftward shift of the mediastinum.  Interval consolidation of the majority of the left lung. Minimal residual aerated left upper lung. Right lung is clear. Thoracic spine degenerative changes. IMPRESSION: Findings most compatible  with interval atelectasis of the majority of the left lung with leftward mediastinal shift. These results will be called to the ordering clinician or representative by the Radiologist Assistant, and communication documented in the PACS or [employer]. Electronically Signed   By: Annia Belt M.D.   On: 09/14/2020 18:53   DG CHEST PORT 1 VIEW  Result Date: 09/11/2020 CLINICAL DATA:  Respiratory distress EXAM: PORTABLE CHEST 1 VIEW COMPARISON:  Radiograph 09/11/2020, chest CT 11/03/2019 FINDINGS: Unchanged cardiomediastinal silhouette. There are bibasilar airspace opacities. There is no large pleural effusion or visible pneumothorax. There is no acute osseous abnormality. IMPRESSION: Bibasilar airspace opacities, which could represent aspiration/pneumonia. Electronically Signed   By: Caprice Renshaw   On: 09/11/2020 18:59   DG Chest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 Perfusion Findings: ASPECTS: 10. CBF (<30%) Volume: 0mL Perfusion (Tmax>6.0s) volume: 7mL Mismatch Volume: 7mL Infarction Location:No acute core infarct by CT perfusion. 7 cc did area of delayed perfusion involving the posterior right temporal occipital region. IMPRESSION: CT HEAD IMPRESSION: 1. No acute intracranial  abnormality. 2. Aspects = 10. 3. Age-related cerebral atrophy with advanced chronic microvascular ischemic disease, with multiple remote lacunar infarcts about the bilateral basal ganglia and thalami, with additional chronic right frontal infarct. CTA HEAD AND NECK IMPRESSION: 1. Negative CTA for emergent large vessel occlusion. 2. Atheromatous change about the carotid bifurcations with associated stenosis of up to 55% on the left and 35% on the right. 3. Additional mild atheromatous change elsewhere about the major arterial vasculature of the head and neck as above. No other proximal high-grade or correctable stenosis. 4. Mild scattered patchy and nodular densities within the partially visualized left upper lobe, suspicious for acute infection/pneumonia. 5.  Emphysema (ICD10-J43.9). CT PERFUSION IMPRESSION: 1. No evidence for acute core infarct by CT perfusion.  2. 7 cc area of delayed perfusion within the posterior right temporoccipital region, which could reflect an area of all vein ischemia. Correlation with dedicated brain MRI suggested as warranted. Critical Value/emergent results were called by telephone at the time of interpretation on 09/11/2020 at 3:50 am to provider [facility] , who verbally acknowledged these results. Electronically Signed   By: Rise Mu M.D.   On: 09/11/2020 04:14    Catarina Hartshorn, DO  [facility]  If 7PM-7AM, please contact night-coverage [email or web address] Password [email or web address] 09/15/2020, 10:31 AM   LOS: 4 days

## 2020-09-15 NOTE — Progress Notes (Signed)
eLink Physician-Brief Progress Note Patient Name: TRYTON BODI DOB: Jan 09, 1966 MRN: [id number]   Date of Service  09/15/2020  HPI/Events of Note  Patient remains NPO  eICU Interventions  Changed CBG to q 4 instead of pre meals     Intervention Category Minor Interventions: Routine modifications to care plan (e.g. PRN medications for pain, fever)  Rosalie Gums Yahia Bottger 09/15/2020, 7:38 PM

## 2020-09-15 NOTE — Consult Note (Addendum)
NAME:  Brent [name], MRN:  [id number], DOB:  [birth year], LOS: 4 ADMISSION DATE:  09/11/2020, CONSULTATION DATE:  7/31 REFERRING MD:  Tat, CHIEF COMPLAINT:  respiratory failure    History of Present Illness:  55 year old male patient with history as mentioned below presented from home with chief complaint of shortness of breath and new onset of worsening left-sided weakness and facial droop .  Telemetry neurology consult was obtained.  CT head and neck completed there was no acute hemorrhage or core infarct he was admitted to the inpatient service with working diagnosis of acute respiratory failure secondary to aspiration pneumonia, sepsis, and new stroke he last smoked cocaine on 7/25. Hospital course Was treated at [facility] for the above, from 7/25 through 7/31 Reason for medical records he had continued episodes of desaturation, a barium swallow was completed on 7/28 which showed moderate pharyngeal phase dysphagia and witnessed aspiration.  He was seen on 7/29 by palliative care medicine for goals of care at this point verified full code, with plan that he would likely need a G-tube for alternative nutrition.  On 7/29 he began to require increased supplemental oxygen, with titration up to as high as 15 L high flow serial chest x-ray imaging on day of admission showed fairly clear chest x-ray however subsequently scale imaging on CT scan 7/28 new left lower lobe consolidation, and subsequent plain imaging on July 30 showed progressive and marked left-sided atelectasis and then on the 31st complete opacification of left hemithorax.  Because of her worsening chest x-ray, and increased oxygen needs he was transferred to [facility] for possible bronchoscopy  Pertinent  Medical History  Hypertension, hyperlipidemia, diabetes type 2, tobacco abuse.  Stroke .  With left hemiparesis.  Cocaine abuse.  OSA. GERD.  Prior seizure disorder.  Prior tracheostomy (remote), and not able to tolerate  CPAP. Significant Hospital Events: Including procedures, antibiotic start and stop dates in addition to other pertinent events   7/25 admitted with aspiration pneumonia and new stroke with worsening left-sided left hemiparesis and facial droop as well as slurred speech.  This was over a 2 to 3-day period of time, in regards to the weakness so was not a tPA or neuro IR candidate. He was admitted therapeutic interventions included antiplatelet therapy, for stroke.  He was placed on azithromycin and Zosyn. 7/28 increasing oxygen needs.  Barium swallow demonstrating aspiration.  Interventional radiology consulted for possible PEG. 7/29 ongoing increased oxygen needs.  Goals of care conversation with palliative care.  Patient wanting full code okay with PEG tube if needed 7/30 through 7/31: Increasing oxygen requirements, worsening left-sided atelectasis to the point of complete opacification of the left hemithorax on time of transfer to [facility] on 7/31.  Azithromycin discontinued.  Interim History / Subjective:  He wants to drink water or chew ice.  Denies shortness of breath.  Does have flank discomfort  Objective   Blood pressure (Abnormal) 140/111, pulse 66, temperature 98.4 F (36.9 C), temperature source Oral, resp. rate 20, height 6' (1.829 m), weight 89.5 kg, SpO2 91 %.        Intake/Output Summary (Last 24 hours) at 09/15/2020 1343 Last data filed at 09/15/2020 1215 Gross per 24 hour  Intake 457.74 ml  Output 575 ml  Net -117.26 ml   Filed Weights   09/14/20 0300 09/15/20 0343 09/15/20 0500  Weight: 89.1 kg 86.5 kg 89.5 kg    Examination: General: This is a 55 year old white male is currently lying in  bed.  He is in no acute distress but remains on high flow oxygen via nasal cannula.  He is quite dysarthric. HENT: Left-sided facial droop noted.  Pupils equal reactive.  No JVD mucous membranes moist Lungs: Rhonchi on right, remarkable  diminished sounds on left.  Currently on 15 L  heated high flow oxygen Cardiovascular: Regular rate and rhythm without murmur rub or gallop Abdomen: Soft not tender Extremities: Warm dry with brisk capillary refill Neuro: Awake, alert, oriented but impulsive.  Left-sided weakness, minimal movement on the left leg.  Can move almost against gravity with the left upper.  Marked left facial droop.  Very dysarthric.  Marland Kitchen GU: Clear yellow.  Resolved Hospital Problem list    Sepsis.  Assessment & Plan:  Acute hypoxic respiratory failure secondary to aspiration pneumonia/mucous plugging with complete collapse of the left hemithorax -Has demonstrated significant impulsivity and noncompliance with dietary restrictions -Poor cough mechanics -Dysphagia with aspiration identified on modified barium swallow 7/28 Plan Admit to the intensive care Aggressive pulmonary hygiene measures this will include: Twice daily hypertonic saline nebulizers, chest physiotherapy, scheduled bronchodilators, and incentive spirometry. Currently day #5 Zosyn and azithromycin, will discontinue azithromycin Continue supplemental oxygen Repeat chest x-ray in a.m., if remains hypoxic with significant ongoing atelectasis/collapse of the left will need intubation and bronchoscopy  History of tobacco abuse, with COPD exacerbation. Previously is followed by Dr. Maple Hudson in our practice Plan Continue scheduled bronchodilators Taper steroids Pulse oximetry  History of obstructive sleep apnea Plan Continue nocturnal oxygen  Acute ischemic stroke involving the right posterior temporal occipital lobe.  Felt partially exacerbated by cocaine abuse Plan Serial neuro checks Seen by neurology, eventually will resume dual antiplatelet therapy with aspirin and Plavix, these are currently on hold pending PEG tube placement Will likely need CIR   History of chronic back pain and polysubstance abuse Plan Continuing gabapentin when able to take PO As needed Tylenol PR  History of  hypertension Plan Holding benazepril and metoprolol for now  Hyperlipidemia Plan Continue Crestor and fenofibrate oncea can take POs  Diabetes type 2 Plan Sliding scale insulin  H/o seizure d/o Plan Will need to go on tegretol once can give via oral route   Best Practice (right click and "Reselect all SmartList Selections" daily)   Diet/type: NPO DVT prophylaxis: LMWH GI prophylaxis: PPI Lines: N/A Foley:  Yes, and it is still needed Code Status:  full code Last date of multidisciplinary goals of care discussion [7/28]  Labs   CBC: Recent Labs  Lab 09/11/20 0312 09/12/20 1020 09/13/20 0438 09/14/20 0330 09/15/20 0449  WBC 15.3* 18.7* 14.0* 14.2* 14.4*  NEUTROABS 12.3*  --   --   --   --   HGB 13.2 14.3 13.8 13.7 14.3  HCT 40.0 42.8 41.6 41.8 44.0  MCV 98.5 97.5 97.4 98.1 98.4  PLT 276 323 298 325 315    Basic Metabolic Panel: Recent Labs  Lab 09/11/20 0312 09/12/20 1020 09/13/20 0438 09/14/20 0330 09/15/20 0449  NA 140 139 141 142 145  K 3.8 4.1 3.6 3.9 3.9  CL 104 103 104 104 107  CO2 29 27 27 28 28   GLUCOSE 135* 197* 137* 142* 148*  BUN 16 20 22* 25* 29*  CREATININE 0.87 0.90 0.86 0.90 0.86  CALCIUM 9.3 9.2 9.2 9.2 9.4  MG  --   --  1.6* 2.0 1.8  PHOS  --   --   --   --  2.7   GFR: Estimated Creatinine Clearance:  106.5 mL/min (by C-G formula based on SCr of 0.86 mg/dL). Recent Labs  Lab 09/11/20 0312 09/11/20 0855 09/12/20 1020 09/13/20 0438 09/14/20 0330 09/15/20 0449  PROCALCITON <0.10  --  <0.10  --   --   --   WBC 15.3*  --  18.7* 14.0* 14.2* 14.4*  LATICACIDVEN  --  1.2  --   --   --   --     Liver Function Tests: Recent Labs  Lab 09/11/20 0312 09/12/20 1020 09/13/20 0438  AST 12* 13* 13*  ALT 11 12 12   ALKPHOS 70 78 70  BILITOT 0.3 0.6 0.7  PROT 7.0 7.5 7.0  ALBUMIN 3.7 3.8 3.5   No results for input(s): LIPASE, AMYLASE in the last 168 hours. No results for input(s): AMMONIA in the last 168 hours.  ABG     Component Value Date/Time   PHART 7.432 09/14/2020 1812   PCO2ART 44.0 09/14/2020 1812   PO2ART 43.4 (L) 09/14/2020 1812   HCO3 27.8 09/14/2020 1812   O2SAT 79.7 09/14/2020 1812     Coagulation Profile: Recent Labs  Lab 09/11/20 0312  INR 0.9    Cardiac Enzymes: No results for input(s): CKTOTAL, CKMB, CKMBINDEX, TROPONINI in the last 168 hours.  HbA1C: Hgb A1c MFr Bld  Date/Time Value Ref Range Status  09/12/2020 04:10 AM 6.4 (H) 4.8 - 5.6 % Final    Comment:    (NOTE)         Prediabetes: 5.7 - 6.4         Diabetes: >6.4         Glycemic control for adults with diabetes: <7.0   07/26/2020 05:59 AM 7.0 (H) 4.8 - 5.6 % Final    Comment:    (NOTE)         Prediabetes: 5.7 - 6.4         Diabetes: >6.4         Glycemic control for adults with diabetes: <7.0     CBG: Recent Labs  Lab 09/11/20 0346 09/15/20 1221  GLUCAP 119* 193*    Review of Systems:   Review of Systems  Constitutional: Negative.   HENT: Negative.    Eyes: Negative.   Respiratory:  Positive for cough and shortness of breath.   Cardiovascular: Negative.   Gastrointestinal: Negative.   Genitourinary: Negative.   Musculoskeletal: Negative.   Skin: Negative.   Neurological:  Positive for speech change, focal weakness and weakness.  Endo/Heme/Allergies: Negative.   Psychiatric/Behavioral: Negative.      Past Medical History:  He,  has a past medical history of Aplastic anemia, unspecified (HCC), Chest pain, unspecified, Chronic airway obstruction, not elsewhere classified, Cocaine substance abuse (HCC), Neuropathy, Other and unspecified hyperlipidemia, Shortness of breath, Stroke (HCC) (06/17/2020), Tobacco use disorder, Type II or unspecified type diabetes mellitus without mention of complication, not stated as uncontrolled, Unspecified epilepsy without mention of intractable epilepsy, and Unspecified essential hypertension.   Surgical History:   Past Surgical History:  Procedure Laterality  Date   CARDIAC CATHETERIZATION  10/02/2010    Nonobstructive mild coronary plaque   KNEE SURGERY Bilateral 2018   Repair   PERFORATED VISCUS SURGERY     MULTIPLE FRACTURES     Social History:   reports that he has been smoking cigarettes. He has a 30.00 pack-year smoking history. He has never used smokeless tobacco. He reports current alcohol use. He reports that he does not use drugs.   Family History:  His family history includes COPD  in his father; Coronary artery disease in his mother; Heart attack in his father.   Allergies No Known Allergies   Home Medications  Prior to Admission medications   Medication Sig Start Date End Date Taking? Authorizing Provider  albuterol (PROVENTIL) (2.5 MG/3ML) 0.083% nebulizer solution Take 2.5 mg by nebulization every 4 (four) hours as needed for wheezing or shortness of breath.   Yes [provider]  albuterol (VENTOLIN HFA) 108 (90 Base) MCG/ACT inhaler Inhale 2 puffs into the lungs every 6 (six) hours as needed for wheezing or shortness of breath.   Yes [provider]  alclomethasone (ACLOVATE) 0.05 % cream Apply 1 application topically 2 (two) times daily.   Yes [provider]  amLODipine (NORVASC) 10 MG tablet Take 10 mg by mouth daily.   Yes [provider]  aspirin EC 81 MG tablet Take 81 mg by mouth daily. Swallow whole.   Yes [provider]  atorvastatin (LIPITOR) 80 MG tablet Take 80 mg by mouth daily.   Yes [provider]  benazepril (LOTENSIN) 10 MG tablet Take 20 mg by mouth daily.   Yes [provider]  carbamazepine (TEGRETOL XR) 400 MG 12 hr tablet Take 1 tablet (400 mg total) by mouth 2 (two) times daily. 06/05/20  Yes Van ClinesAquino, [name] M, MD  fenofibrate 160 MG tablet Take 160 mg by mouth daily. 12/11/16  Yes [provider]  gabapentin (NEURONTIN) 300 MG capsule Take 3 caps in AM, 3 caps at noon, 4 caps at bedtime Patient taking differently: Take 900-1,200 mg  by mouth 3 (three) times daily. Take 3 caps in AM, 3 caps at noon, 4 caps at bedtime 06/05/20  Yes Van ClinesAquino, [name] M, MD  insulin glargine (LANTUS) 100 UNIT/ML injection Inject 70 Units into the skin daily as needed (depending on sugar level).   Yes [provider]  JANUMET 50-1000 MG tablet Take 1 tablet by mouth 2 (two) times daily. 07/10/20  Yes [provider]  Omega-3 Fatty Acids (FISH OIL) 1000 MG CAPS Take 500 mg by mouth 2 (two) times daily.   Yes [provider]  omeprazole (PRILOSEC) 40 MG capsule Take 1 capsule (40 mg total) by mouth 2 (two) times daily. 03/21/19  Yes Tawni PummelWoodard, Janice B, PA-C  rosuvastatin (CRESTOR) 20 MG tablet Take 1 tablet (20 mg total) by mouth daily. 07/30/20  Yes Vassie LollMadera, Carlos, MD  dextromethorphan-guaiFENesin [facility](MUCINEX DM) 30-600 MG 12hr tablet Take 1 tablet by mouth 2 (two) times daily. Patient not taking: Reported on 09/11/2020 11/08/19   Swayze, Ava, DO  food thickener (SIMPLYTHICK, HONEY/LEVEL 3/MODERATELY THICK,) LIQD Take 1 packet by mouth as needed. 07/29/20 11/26/20  Vassie LollMadera, Carlos, MD     Critical care time: 32 min    Simonne MartinetPeter E Tyniesha Howald ACNP-BC [facility] Pulmonary/Critical Care Pager # [phone] OR # [phone] if no answer

## 2020-09-15 NOTE — Progress Notes (Signed)
Called report on patient to [place], RRT at [facility]  Ladona Ridgel, RRT has 63M but had to transport patient to MRI, so Lowella Bandy took report.

## 2020-09-15 NOTE — Progress Notes (Signed)
Report called and given to RN at [facility]. [facility] still at bedside to transport patient. All belongings sent with patient. Only personal belongings attending RN seen was a pair of shorts.

## 2020-09-15 NOTE — Plan of Care (Signed)
Called to the bedside by [facility] w/ concerns of pt having increased work of breathing. Pt admitted for aspiration PNA as well as L lung whiteout from mucous plug/aspiration. Pt had desaturated to the mid-80s and was placed briefly on 100% FIO2, 50L HFNC. Weak intermittent cough but was breathing comfortably at the time of bedside assessement. Weaned O2 back down to 60% fio2 and pt still saturating in the low 90s breathing comfortably and starting some CPT. For now, will proceed with CPT and conservative management. Should pt desaturate again will plan to intubate and perform bedside bronchoscopy to remove L main mucous plugs, cont NPO for now, discussed plan with nursing and RT.

## 2020-09-15 NOTE — Progress Notes (Signed)
eLink Physician-Brief Progress Note Patient Name: Brent [name] DOB: 1965-02-26 MRN: [id number]   Date of Service  09/15/2020  HPI/Events of Note  Received request for sleep aid. Patient states he takes Xanax Patient seen not in distress, conversant As per bedside RN able to clear secretions better and able to use Yankuer himself to suction secretions out  eICU Interventions  Will give low dose Benadryl 12.5 mg IV Would not be giving anything much stronger. Discussed with bedside RN     Intervention Category Minor Interventions: Routine modifications to care plan (e.g. PRN medications for pain, fever)  Rosalie Gums Charline Hoskinson 09/15/2020, 10:05 PM

## 2020-09-15 NOTE — Progress Notes (Signed)
Report given to [facility]. ETA is 15 mins for transport of patient.

## 2020-09-15 NOTE — Progress Notes (Signed)
eLink Physician-Brief Progress Note Patient Name: KAPONO LUHN DOB: 1965/07/26 MRN: [id number]   Date of Service  09/15/2020  HPI/Events of Note  Notified of respiratory distress and desaturation and possible need for intubation.  On camera assessment patient is tachypneic, not in acute distress. Sats improved to 90% on high flow  Bedside RN observes poor cough reflex  eICU Interventions  Bedside CCM team informed and will assess     Intervention Category Intermediate Interventions: Respiratory distress - evaluation and management  Darl Pikes 09/15/2020, 8:10 PM

## 2020-09-15 NOTE — Progress Notes (Addendum)
Tried ntsx patient did obtain white secretions thicker than rest, also generated strong cough. Patient appears positional with oxygen saturation dropping when flat or rt side down.

## 2020-09-16 ENCOUNTER — Inpatient Hospital Stay (HOSPITAL_COMMUNITY): Payer: Medicaid Other

## 2020-09-16 DIAGNOSIS — J9601 Acute respiratory failure with hypoxia: Secondary | ICD-10-CM | POA: Diagnosis not present

## 2020-09-16 DIAGNOSIS — T17500A Unspecified foreign body in bronchus causing asphyxiation, initial encounter: Secondary | ICD-10-CM | POA: Diagnosis not present

## 2020-09-16 DIAGNOSIS — J690 Pneumonitis due to inhalation of food and vomit: Secondary | ICD-10-CM | POA: Diagnosis not present

## 2020-09-16 LAB — BASIC METABOLIC PANEL
Anion gap: 9 (ref 5–15)
BUN: 24 mg/dL — ABNORMAL HIGH (ref 6–20)
CO2: 27 mmol/L (ref 22–32)
Calcium: 9.6 mg/dL (ref 8.9–10.3)
Chloride: 109 mmol/L (ref 98–111)
Creatinine, Ser: 0.87 mg/dL (ref 0.61–1.24)
GFR, Estimated: >60 mL/min (ref >60)
Glucose, Bld: 161 mg/dL — ABNORMAL HIGH (ref 70–99)
Potassium: 3.7 mmol/L (ref 3.5–5.1)
Sodium: 145 mmol/L (ref 135–145)

## 2020-09-16 LAB — GLUCOSE, CAPILLARY
Glucose-Capillary: 158 mg/dL — ABNORMAL HIGH (ref 70–99)
Glucose-Capillary: 200 mg/dL — ABNORMAL HIGH (ref 70–99)
Glucose-Capillary: 178 mg/dL — ABNORMAL HIGH (ref 70–99)
Glucose-Capillary: 144 mg/dL — ABNORMAL HIGH (ref 70–99)
Glucose-Capillary: 185 mg/dL — ABNORMAL HIGH (ref 70–99)
Glucose-Capillary: 240 mg/dL — ABNORMAL HIGH (ref 70–99)

## 2020-09-16 LAB — CBC
HCT: 44.5 % (ref 39.0–52.0)
Hemoglobin: 14.5 g/dL (ref 13.0–17.0)
MCH: 31.3 pg (ref 26.0–34.0)
MCHC: 32.6 g/dL (ref 30.0–36.0)
MCV: 96.1 fL (ref 80.0–100.0)
Platelets: 415 10*3/uL — ABNORMAL HIGH (ref 150–400)
RBC: 4.63 MIL/uL (ref 4.22–5.81)
RDW: 13.1 % (ref 11.5–15.5)
WBC: 19.2 10*3/uL — ABNORMAL HIGH (ref 4.0–10.5)
nRBC: 0 % (ref 0.0–0.2)

## 2020-09-16 LAB — PHOSPHORUS
Phosphorus: 2.4 mg/dL — ABNORMAL LOW (ref 2.5–4.6)
Phosphorus: 2.1 mg/dL — ABNORMAL LOW (ref 2.5–4.6)

## 2020-09-16 LAB — MAGNESIUM
Magnesium: 1.6 mg/dL — ABNORMAL LOW (ref 1.7–2.4)
Magnesium: 2.3 mg/dL (ref 1.7–2.4)

## 2020-09-16 IMAGING — DX DG ABD PORTABLE 1V
1 series · 1 of 1 positions shown · non-contrast
Comparison: CT abdomen pelvis, [DATE].

CLINICAL DATA: 54-year-old male for enteric feeding tube placement

EXAM:
PORTABLE ABDOMEN - 1 VIEW

[abdomen]
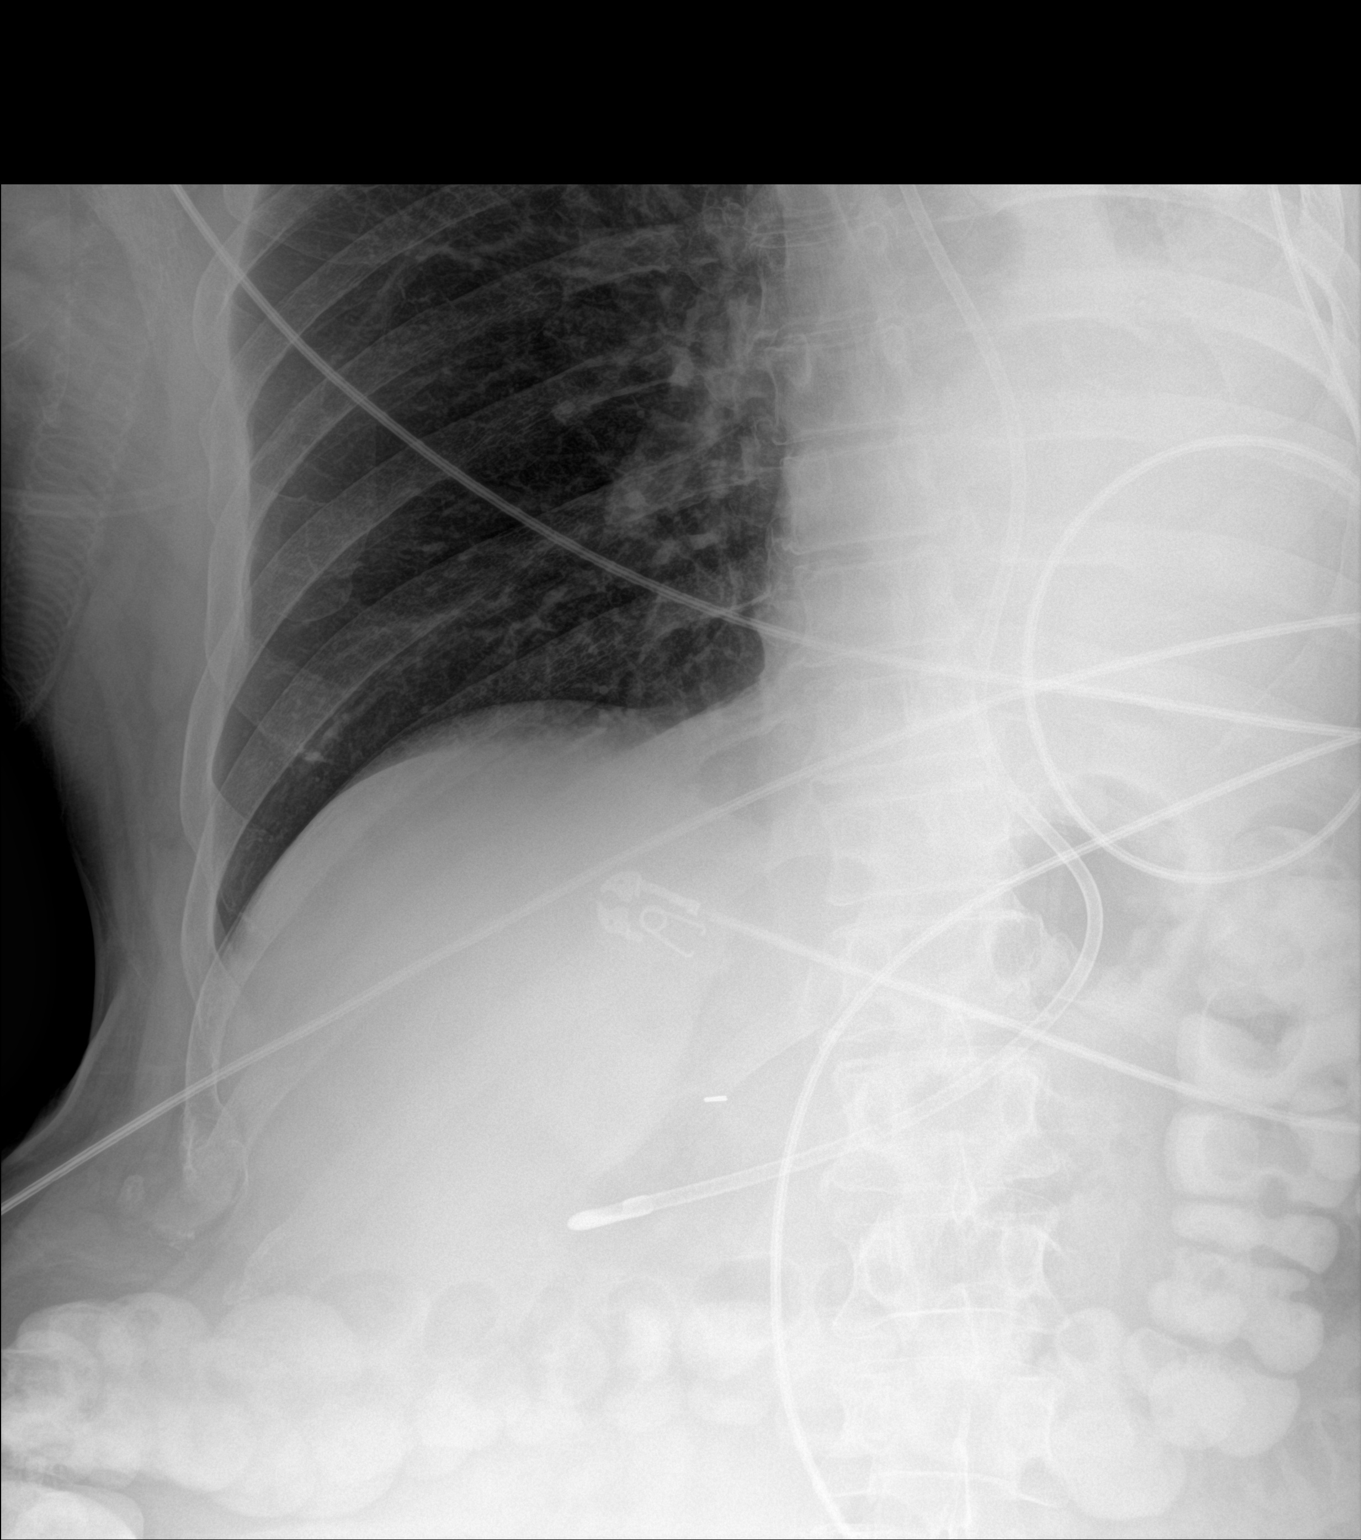

[1 of 1 positions shown; findings below may reference images not displayed]

FINDINGS: Enteric feeding tube, with tip to the right of midline and likely
transpyloric within the proximal small bowel. Duodenal clip.
Contrast opacification of imaged colon. Imaged bowel is not
obstructed.
IMPRESSION: Transpyloric enteric feeding tube.

## 2020-09-16 IMAGING — DX DG CHEST 1V PORT
1 series · 1 of 1 positions shown · non-contrast
Comparison: [DATE]

CLINICAL DATA: Pneumonia

EXAM:
PORTABLE CHEST 1 VIEW

[chest]
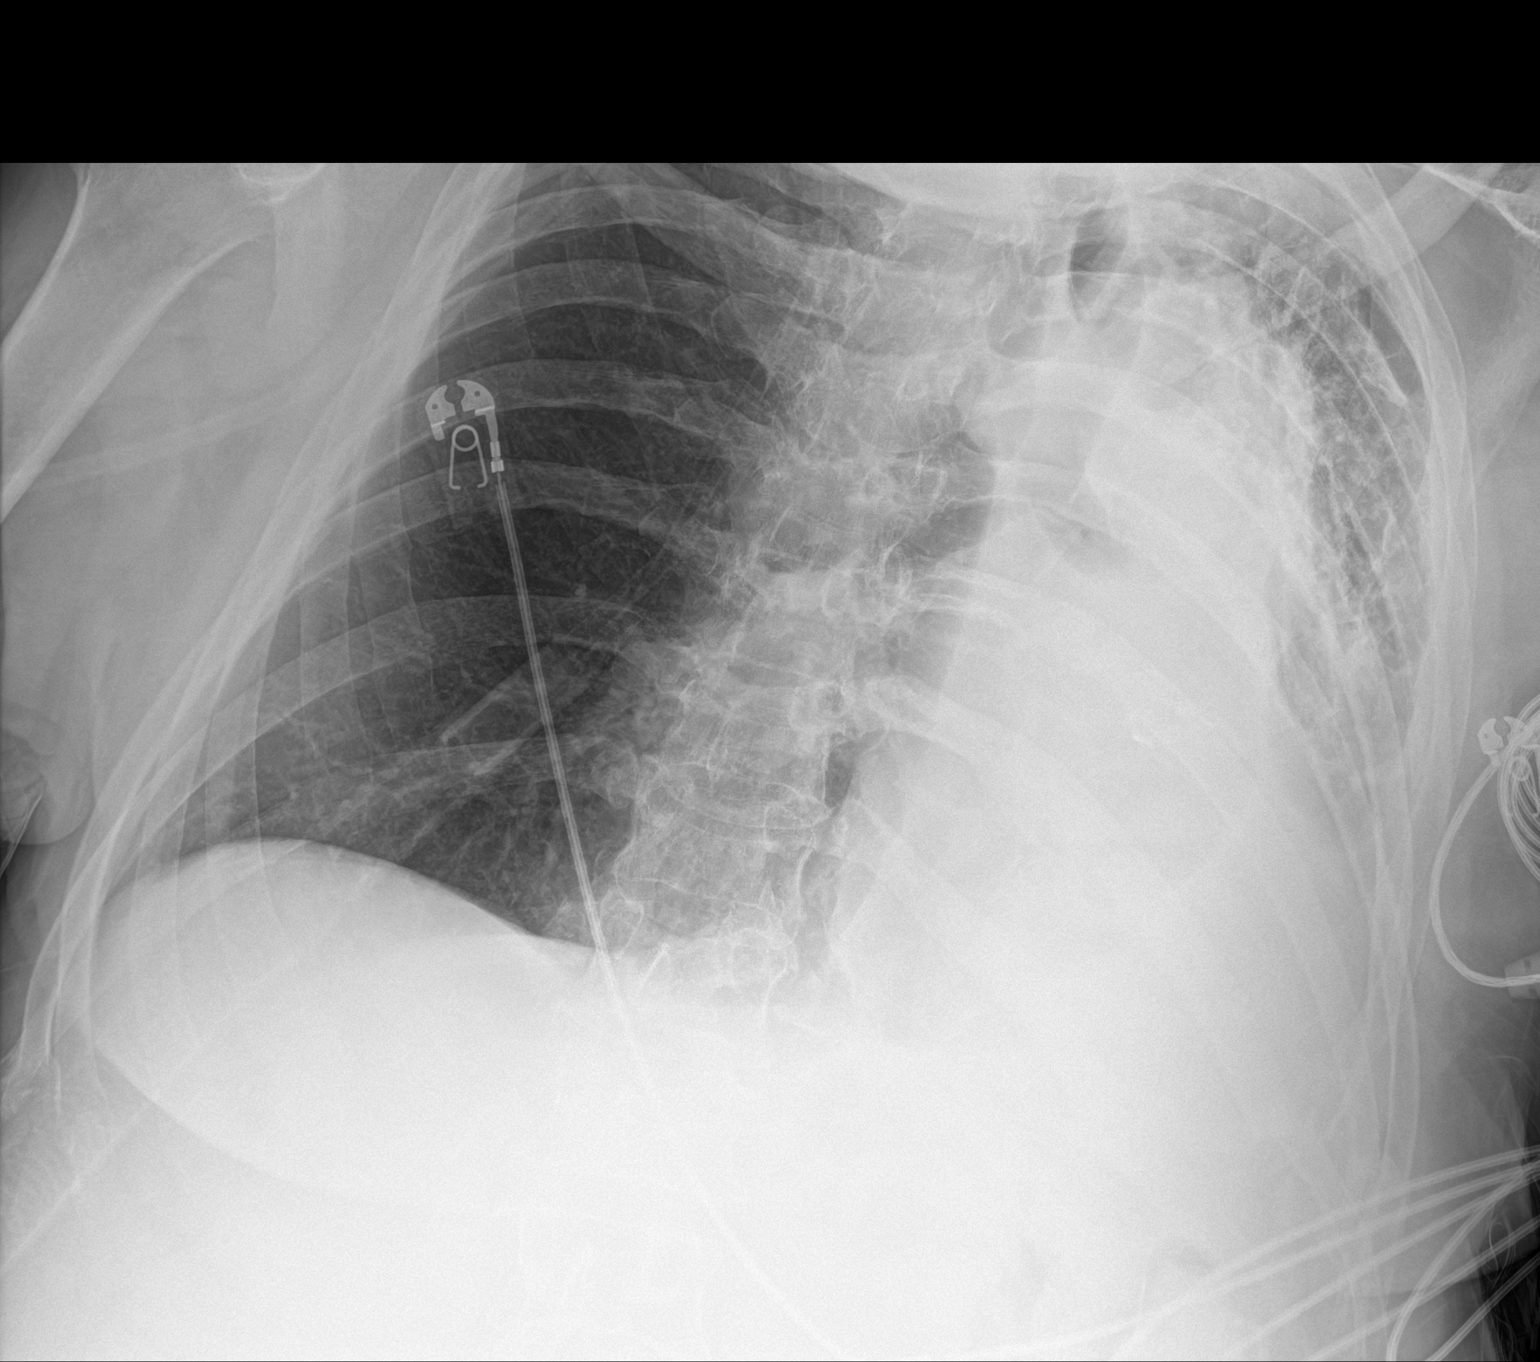

[1 of 1 positions shown; findings below may reference images not displayed]

FINDINGS: Some improvement in aeration on the left with near complete
opacification. Some aerated left upper lobe. Shift of mediastinal
contours to the left compatible with volume loss. Right lung clear.
Heart is normal size.
IMPRESSION: Near complete opacification of the left hemithorax with some
improvement in aeration in the left upper lobe. Volume loss on the
left.

## 2020-09-16 MED ORDER — ASPIRIN 325 MG PO TABS
325.0000 mg | ORAL_TABLET | Freq: Every day | ORAL | Status: DC
  Filled 2020-09-16: qty 1

## 2020-09-16 MED ORDER — CHLORHEXIDINE GLUCONATE CLOTH 2 % EX PADS
6.0000 | MEDICATED_PAD | Freq: Every day | CUTANEOUS | Status: DC
  Administered 2020-09-16 – 2020-09-26 (×11): 6 via TOPICAL

## 2020-09-16 MED ORDER — POTASSIUM CHLORIDE 20 MEQ PO PACK
20.0000 meq | PACK | Freq: Once | ORAL | Status: AC
  Administered 2020-09-16: 20 meq
  Filled 2020-09-16: qty 1

## 2020-09-16 MED ORDER — ROSUVASTATIN CALCIUM 20 MG PO TABS
20.0000 mg | ORAL_TABLET | Freq: Every day | ORAL | Status: DC
  Administered 2020-09-16 – 2020-09-27 (×12): 20 mg
  Filled 2020-09-16 (×12): qty 1

## 2020-09-16 MED ORDER — FENOFIBRATE 160 MG PO TABS
160.0000 mg | ORAL_TABLET | Freq: Every day | ORAL | Status: DC
  Administered 2020-09-16 – 2020-09-27 (×12): 160 mg
  Filled 2020-09-16 (×12): qty 1

## 2020-09-16 MED ORDER — ACETAMINOPHEN 160 MG/5ML PO SOLN
650.0000 mg | ORAL | Status: DC | PRN
  Administered 2020-09-18 – 2020-09-24 (×8): 650 mg
  Filled 2020-09-16 (×9): qty 20.3

## 2020-09-16 MED ORDER — MAGNESIUM SULFATE 4 GM/100ML IV SOLN
4.0000 g | Freq: Once | INTRAVENOUS | Status: AC
  Administered 2020-09-16: 4 g via INTRAVENOUS
  Filled 2020-09-16: qty 100

## 2020-09-16 MED ORDER — POTASSIUM CHLORIDE 10 MEQ/100ML IV SOLN
10.0000 meq | INTRAVENOUS | Status: AC
  Administered 2020-09-16 (×3): 10 meq via INTRAVENOUS
  Filled 2020-09-16 (×3): qty 100

## 2020-09-16 MED ORDER — CARBAMAZEPINE 100 MG/5ML PO SUSP
200.0000 mg | Freq: Four times a day (QID) | ORAL | Status: DC
  Administered 2020-09-16 – 2020-09-27 (×40): 200 mg
  Filled 2020-09-16 (×49): qty 10

## 2020-09-16 MED ORDER — OSMOLITE 1.5 CAL PO LIQD
1000.0000 mL | ORAL | Status: DC
  Administered 2020-09-16 – 2020-09-24 (×8): 1000 mL
  Filled 2020-09-16 (×11): qty 1000

## 2020-09-16 MED ORDER — MELATONIN 3 MG PO TABS
3.0000 mg | ORAL_TABLET | Freq: Every evening | ORAL | Status: DC | PRN
  Administered 2020-09-17 – 2020-09-25 (×9): 3 mg
  Filled 2020-09-16 (×9): qty 1

## 2020-09-16 MED ORDER — PROSOURCE TF PO LIQD
45.0000 mL | Freq: Every day | ORAL | Status: DC
  Administered 2020-09-16 – 2020-09-27 (×12): 45 mL
  Filled 2020-09-16 (×12): qty 45

## 2020-09-16 MED ORDER — INSULIN DETEMIR 100 UNIT/ML ~~LOC~~ SOLN
10.0000 [IU] | Freq: Every day | SUBCUTANEOUS | Status: DC
  Administered 2020-09-16: 10 [IU] via SUBCUTANEOUS
  Filled 2020-09-16 (×2): qty 0.1

## 2020-09-16 NOTE — Progress Notes (Signed)
Nutrition Follow-up  DOCUMENTATION CODES:   Not applicable  INTERVENTION:   Initiate tube feeds via Cortrak: - Start Osmolite 1.5 @ 25 ml/hr and advance by 10 ml q 6 hours to goal rate of 65 ml/hr (1560 ml/day) - ProSource TF 45 ml daily  Tube feeding regimen at goal provides 2380 kcal, 109 grams of protein, and 1189 ml of free H2O.  Monitor magnesium, potassium, and phosphorus daily for at least 3 days, MD to replete as needed, as pt is at risk for refeeding syndrome given inadequate nutrition since admission (x 5 days).  NUTRITION DIAGNOSIS:   Inadequate oral intake related to dysphagia (acute ischemic/embolic stroke) as evidenced by NPO status (SLP recommending NPO with alternate feeding source. Hx of aspiration PNA and previous strokes).  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 On 7/27, pt experienced an acute ischemic/embolic stroke with left hemiparesis, dysarthria, and severe dysphagia. Pt admitted with acute respiratory failure with hypoxia, aspiration PNA, COPD exacerbation, and sepsis.  7/28 - MBS with recommendations for NPO 7/31 - pt transferred to [facility] 8/01 - Cortrak placed (tip gastric)  Discussed pt with RN and during ICU rounds. Consult received for tube feeding initiation and management. Per notes, pt has consented to a PEG if needed. Cortrak placed today.  Spoke with pt at bedside. He reports a good appetite PTA and typically eating 3 meals a day. Pt states that he eats "normal food" and didn't have any issues swallowing PTA. Pt believes that he has  lost a good bit of weight since admission due to not being able to eat. Current weight is up compared to admit weight.  Reviewed weight history in chart. Pt with a 6.9 kg weight loss since 11/03/19. This is a 7.2% weight loss in 11 months which is not significant for timeframe.  Admit weight: 85.7 kg Current weight: 89.5 kg  Medications reviewed and include: SSI q 4 hours, IV solu-medrol, IV abx, IV KCl 10 mEq x 4 runs IVF: NS @ 10 ml/hr  Labs reviewed: BUN 24, phosphorus 2.4, magnesium 1.6 CBG's: 141-200 x 24 hours  UOP: 1075 ml x 24 hours I/O's: -3.1 L since admit  NUTRITION - FOCUSED PHYSICAL EXAM:  Flowsheet Row Most Recent Value  Orbital Region No depletion  Upper Arm Region No depletion  Thoracic and Lumbar Region No depletion  Buccal Region No depletion  Temple Region Mild depletion  Clavicle Bone Region Mild depletion  Clavicle and Acromion Bone Region Mild depletion  Scapular Bone Region Unable to assess  Dorsal Hand No depletion  Patellar Region Mild depletion  Anterior Thigh Region Moderate depletion  Posterior Calf Region No depletion  Edema (RD Assessment) None  Hair Reviewed  Eyes Reviewed  Mouth Reviewed  Skin Reviewed  Nails Reviewed       Diet Order:   Diet Order             Diet NPO time specified  Diet effective now  EDUCATION NEEDS:   Education needs have been addressed  Skin:  Skin Assessment: Reviewed RN Assessment  Last BM:  09/12/20  Height:   Ht Readings from Last 1 Encounters:  09/15/20 6' (1.829 m)    Weight:   Wt Readings from Last 1 Encounters:  09/15/20 89.5 kg    BMI:  Body mass index is 26.76 kg/m.  Estimated Nutritional Needs:   Kcal:  2200-2400  Protein:  105-115 grams  Fluid:  >2400 ml daily    Gustavus Bryant, MS, RD, LDN Inpatient Clinical Dietitian Please see AMiON for contact information.

## 2020-09-16 NOTE — Progress Notes (Signed)
[facility] ADULT ICU REPLACEMENT PROTOCOL   The patient does apply for the [facility] Adult ICU Electrolyte Replacment Protocol based on the criteria listed below:   1.Exclusion criteria: TCTS patients, ECMO patients and Hypothermia Protocol, and   Dialysis patients 2. Is GFR >/= 30 ml/min? Yes.    Patient's GFR today is >60 3. Is SCr </= 2? No. Patient's SCr is 0.84 mg/dL 4. Did SCr increase >/= 0.5 in 24 hours? No 5.Pt's weight >40kg  Yes.   6. Abnormal electrolyte(s): K+ 3.7, mag 1.6  7. Electrolytes replaced per protocol 8.  Call MD STAT for K+ </= 2.5, Phos </= 1, or Mag </= 1 Physician:  Deedra Ehrich 09/16/2020 6:43 AM

## 2020-09-16 NOTE — Progress Notes (Signed)
Occupational Therapy Co-Treatment w/ PT Patient Details Name: Brent [name] MRN: [id number] DOB: 1965-03-29 Today's Date: 09/16/2020    History of present illness 55 y.o. male presenting to ED with SOB, slurred speech and L sided weakness x2-3 days. Patient admitted with acute respiratory failure with hypoxia secondary to aspiration PNA, COPD exacerbation and sepsis. Imaging (+) R ACA CVA x2 within R cingulate gyrus/callosal body, R parietal lob subcortical white matter infarct and questionable acute punctate infarct within L frontal lobe subcortical white matter. Transferred to ICU at [facility] 7/31. PMHx significant for cocaine and tobacco use disorder, lumbago, HTN, HLD, DMII and Hx of CVA.   OT comments  Co-treatment with PT with focus on bed mobility, functional transfers and NMR. Patient required Max A +2 for bed mobility this date and Mod A +2 for sit to stand from ICU bed to stedy with cues for hand placement and upright posture (LOB in all directions while seated EOB 2/2 truncal weakness). Patient continues to be limited by deficits listed below including decreased activity tolerance, decrease sitting/standing balance, L hemiplegia, and possible visual deficits and would benefit from continued acute OT services in prep for safe d/c to next level. Continued recommendation for CIR.    Follow Up Recommendations  CIR    Equipment Recommendations  Other (comment) (Defer to next level of care.)    Recommendations for Other Services      Precautions / Restrictions Precautions Precautions: Fall Restrictions Weight Bearing Restrictions: No       Mobility Bed Mobility Overal bed mobility: Needs Assistance       Supine to sit: Max assist;+2 for physical assistance;+2 for safety/equipment     General bed mobility comments: Max A +2 for supine to EOB to R; assist to advance BLE and elevate trunk; patient attempts to reach for rail with LUE and hand over hand assist from RUE but unable  to maintain grasp; would likely require less assist going to L.    Transfers Overall transfer level: Needs assistance Equipment used: 2 person hand held assist;Ambulation equipment used Transfers: Sit to/from [employer] Sit to Stand: Mod assist;+2 physical assistance;+2 safety/equipment Stand pivot transfers: Total assist       General transfer comment: Patient unable to come to stand from ICU bed despite +2 assist; Mod A +2 for sit to stand with use of Stedy and cues for hand placement.Stedy used for transfer to recliner.    Balance Overall balance assessment: Needs assistance Sitting-balance support: Feet supported;Single extremity supported Sitting balance-Leahy Scale: Poor Sitting balance - Comments: Reliant on external assist to maintain static sitting balnace at EOB; LOB in all directions. Postural control: Right lateral lean;Left lateral lean;Other (comment) (Anterior lean) Standing balance support: Bilateral upper extremity supported;During functional activity Standing balance-Leahy Scale: Poor Standing balance comment: Reliant on external assist                           ADL either performed or assessed with clinical judgement   ADL Overall ADL's : Needs assistance/impaired                         Toilet Transfer: Total assistance Toilet Transfer Details (indicate cue type and reason): Use of Stedy and Mod A +2.           General ADL Comments: Patient greatly limited by L hemiplegia, visual deficits, decreased static sitting balance and need for +2 assist for  all bed mobility and functional transfers grossly.     Vision   Vision Assessment?: Yes Eye Alignment: Within Functional Limits Ocular Range of Motion: Within Functional Limits Tracking/Visual Pursuits: Able to track stimulus in all quads without difficulty Visual Fields: Other (comment) (Difficulty following commands necessary for formal testing; ?deficits noted in  nasal aspect of both eyes?)   Perception     Praxis      Cognition Arousal/Alertness: Awake/alert Behavior During Therapy: WFL for tasks assessed/performed Overall Cognitive Status: No family/caregiver present to determine baseline cognitive functioning                                 General Comments: Follows 1-step verbal commands with good accuracy; A&Ox3.        Exercises     Shoulder Instructions       General Comments 40L HFNC, FiO2 60%    Pertinent Vitals/ Pain       Pain Assessment: No/denies pain  Home Living                                          Prior Functioning/Environment              Frequency  Min 2X/week        Progress Toward Goals  OT Goals(current goals can now be found in the care plan section)  Progress towards OT goals: Progressing toward goals  Acute Rehab OT Goals Patient Stated Goal: return home after rehab OT Goal Formulation: With patient Time For Goal Achievement: 09/26/20 Potential to Achieve Goals: Fair ADL Goals Pt Will Perform Grooming: with min assist;with min guard assist;sitting;with adaptive equipment Pt Will Perform Upper Body Dressing: with min assist;with min guard assist;sitting;bed level Pt Will Perform Lower Body Dressing: with mod assist;with max assist;sitting/lateral leans;bed level;with adaptive equipment Pt Will Transfer to Toilet: with mod assist;stand pivot transfer;bedside commode Pt/caregiver will Perform Home Exercise Program: Increased ROM;Left upper extremity;With minimal assist;With Supervision  Plan Discharge plan remains appropriate;Frequency remains appropriate    Co-evaluation    PT/OT/SLP Co-Evaluation/Treatment: Yes Reason for Co-Treatment: Complexity of the patient's impairments (multi-system involvement);To address functional/ADL transfers   OT goals addressed during session: ADL's and self-care      AM-PAC OT "6 Clicks" Daily Activity     Outcome  Measure   Help from another person eating meals?: Total (cortrak) Help from another person taking care of personal grooming?: A Lot Help from another person toileting, which includes using toliet, bedpan, or urinal?: Total Help from another person bathing (including washing, rinsing, drying)?: A Lot Help from another person to put on and taking off regular upper body clothing?: A Lot Help from another person to put on and taking off regular lower body clothing?: Total 6 Click Score: 9    End of Session Equipment Utilized During Treatment: Gait belt;Other (comment) Antony Salmon)  OT Visit Diagnosis: Unsteadiness on feet (R26.81);Muscle weakness (generalized) (M62.81);Other symptoms and signs involving the nervous system (R29.898);Hemiplegia and hemiparesis Hemiplegia - Right/Left: Left Hemiplegia - dominant/non-dominant: Non-Dominant Hemiplegia - caused by: Cerebral infarction   Activity Tolerance Patient tolerated treatment well   Patient Left in chair;with call bell/phone within reach;with chair alarm set   Nurse Communication Mobility status (Need for maxisky back to bed)        Time: 1128-1209 OT Time Calculation (min): 41  min  Charges: OT General Charges $OT Visit: 1 Visit OT Treatments $Therapeutic Activity: 8-22 mins $Neuromuscular Re-education: 8-22 mins  Amilio Zehnder H. OTR/L Supplemental OT, Department of rehab services [phone]   Teofila Bowery R H. 09/16/2020, 12:40 PM

## 2020-09-16 NOTE — Progress Notes (Signed)
eLink Physician-Brief Progress Note Patient Name: ADISON JERGER DOB: 1965/07/20 MRN: [id number]   Date of Service  09/16/2020  HPI/Events of Note  Notified of Mg 1.6 Creatinine 0.87  eICU Interventions  Initiate eLink electrolyte replacement protocol     Intervention Category Intermediate Interventions: Electrolyte abnormality - evaluation and management  Darl Pikes 09/16/2020, 6:35 AM

## 2020-09-16 NOTE — Progress Notes (Signed)
NAME:  Brent [name], MRN:  [id number], DOB:  08/19/1965, LOS: 5 ADMISSION DATE:  09/11/2020, CONSULTATION DATE:  7/31 REFERRING MD:  Tat, CHIEF COMPLAINT:  respiratory failure    History of Present Illness:  55 year old male patient with history as mentioned below presented from home with chief complaint of shortness of breath and new onset of worsening left-sided weakness and facial droop .  Telemetry neurology consult was obtained.  CT head and neck completed there was no acute hemorrhage or core infarct he was admitted to the inpatient service with working diagnosis of acute respiratory failure secondary to aspiration pneumonia, sepsis, and new stroke he last smoked cocaine on 7/25. Hospital course Was treated at [facility] for the above, from 7/25 through 7/31 Reason for medical records he had continued episodes of desaturation, a barium swallow was completed on 7/28 which showed moderate pharyngeal phase dysphagia and witnessed aspiration.  He was seen on 7/29 by palliative care medicine for goals of care at this point verified full code, with plan that he would likely need a G-tube for alternative nutrition.  On 7/29 he began to require increased supplemental oxygen, with titration up to as high as 15 L high flow serial chest x-ray imaging on day of admission showed fairly clear chest x-ray however subsequently scale imaging on CT scan 7/28 new left lower lobe consolidation, and subsequent plain imaging on July 30 showed progressive and marked left-sided atelectasis and then on the 31st complete opacification of left hemithorax.  Because of her worsening chest x-ray, and increased oxygen needs he was transferred to [facility] for possible bronchoscopy  Pertinent  Medical History  Hypertension, hyperlipidemia, diabetes type 2, tobacco abuse.  Stroke .  With left hemiparesis.  Cocaine abuse.  OSA. GERD.  Prior seizure disorder.  Prior tracheostomy (remote), and not able to tolerate  CPAP. Significant Hospital Events: Including procedures, antibiotic start and stop dates in addition to other pertinent events   7/25 admitted with aspiration pneumonia and new stroke with worsening left-sided left hemiparesis and facial droop as well as slurred speech.  This was over a 2 to 3-day period of time, in regards to the weakness so was not a tPA or neuro IR candidate. He was admitted therapeutic interventions included antiplatelet therapy, for stroke.  He was placed on azithromycin and Zosyn. 7/28 increasing oxygen needs.  Barium swallow demonstrating aspiration.  Interventional radiology consulted for possible PEG. 7/29 ongoing increased oxygen needs.  Goals of care conversation with palliative care.  Patient wanting full code okay with PEG tube if needed 7/30 through 7/31: Increasing oxygen requirements, worsening left-sided atelectasis to the point of complete opacification of the left hemithorax on time of transfer to [facility] on 7/31.  Azithromycin discontinued.  Interim History / Subjective:  Frequent CPT overnight. He denies complaints today and says he cannot cough.  Objective   Blood pressure (!) 156/78, pulse 95, temperature 98.2 F (36.8 C), temperature source Oral, resp. rate (!) 22, height 6' (1.829 m), weight 89.5 kg, SpO2 97 %.    FiO2 (%):  [60 %-100 %] 60 %   Intake/Output Summary (Last 24 hours) at 09/16/2020 0937 Last data filed at 09/16/2020 0600 Gross per 24 hour  Intake 535.91 ml  Output 1075 ml  Net -539.09 ml    Filed Weights   09/14/20 0300 09/15/20 0343 09/15/20 0500  Weight: 89.1 kg 86.5 kg 89.5 kg    Examination: General: chronically ill appearing man lying in bed no  acute distress HENT: [place]/AT, facial droop Neck: Previous tracheostomy scar healed Lungs: Reduced left-sided breath sounds, faint rhonchi on the right.  Not able to cough on command.  On HHFNC 60%.  Moderate strength cough with NTS, thick clear secretions. Cardiovascular: Regular rate  and rhythm, S1-S2 Abdomen: Soft, nontender, nondistended Extremities: No peripheral edema, no cyanosis Neuro: Awake, sluggish verbal responses with dysarthria.  Answering questions with appropriate answers.  Not moving left lower extremity, weak in the left upper extremity relative to the right. Derm: Warm, dry  Cxr personally reviewed> peripheral clearing in LUL, still dense opacification in LLL. Left rotation contributing to mediastinal shift. In comparison to admission CXR on 7/27, had normal LLL.  Resolved Hospital Problem list    Sepsis.  Assessment & Plan:  Acute hypoxic respiratory failure secondary to aspiration pneumonia/mucous plugging with complete collapse of the left lung. Has demonstrated significant impulsivity and noncompliance with dietary restrictions--track placement today.  He is to have very poor cough mechanics. Dysphagia with aspiration identified on modified barium swallow 7/28 Plan -Continue ICU monitoring given high risk for respiratory decompensation.  Adding NTS as needed to pulmonary hygiene regimen.  -Continue hypertonic saline, chest PT, bronchodilators.  I am not sure that he is cognitively able to understand and perform the maneuvers of coughing and incentive spirometry. -Continue Zosyn  -Continue supplemental oxygen; wean down as tolerated.  Would require intubation if he requires bronchoscopy.   History of tobacco abuse, with COPD exacerbation. Previously is followed by Dr. Maple Hudson  -Continue bronchodilators - Steroids tapering  History of obstructive sleep apnea- AHI 7.8.  Not on CPAP PTA. -Nocturnal supplemental oxygen  Acute ischemic stroke involving the right posterior temporal occipital lobe.  Partially exacerbated by cocaine abuse. -Continue neuro monitoring -Seen by neurology admission, eventually will resume dual antiplatelet therapy with aspirin and Plavix, these are currently on hold pending NGT/ PEG tube placement -Will likely need CIR    History of chronic back pain and polysubstance abuse -Continuing gabapentin when able to take PO -As needed Tylenol PR  History of hypertension -Okay to continue mild permissive hypertension.  Can resume metoprolol and benazepril slowly and monitor for worsening neuro symptoms  Hyperlipidemia -Resume Crestor and fenofibrate once he central access.  Diabetes type 2 with hyperglycemia -Continue sliding scale insulin as needed - Goal BG 100 4280 - Adding long-acting insulin today since tube feeds will be starting  H/o seizure d/o Plan -Can restart PTA tegretol once core track is placed> transition to short acting 200mg  Q6h  Tobacco abuse -nicotine replacement therapy   Best Practice (right click and "Reselect all SmartList Selections" daily)   Diet/type: NPO DVT prophylaxis: LMWH GI prophylaxis: PPI Lines: N/A Foley:  Yes, and it is still needed Code Status:  full code Last date of multidisciplinary goals of care discussion [7/28]  Labs   CBC: Recent Labs  Lab 09/11/20 0312 09/12/20 1020 09/13/20 0438 09/14/20 0330 09/15/20 0449 09/16/20 0427  WBC 15.3* 18.7* 14.0* 14.2* 14.4* 19.2*  NEUTROABS 12.3*  --   --   --   --   --   HGB 13.2 14.3 13.8 13.7 14.3 14.5  HCT 40.0 42.8 41.6 41.8 44.0 44.5  MCV 98.5 97.5 97.4 98.1 98.4 96.1  PLT 276 323 298 325 315 415*     Basic Metabolic Panel: Recent Labs  Lab 09/12/20 1020 09/13/20 0438 09/14/20 0330 09/15/20 0449 09/16/20 0427  NA 139 141 142 145 145  K 4.1 3.6 3.9 3.9 3.7  CL 103  104 104 107 109  CO2 27 27 28 28 27   GLUCOSE 197* 137* 142* 148* 161*  BUN 20 22* 25* 29* 24*  CREATININE 0.90 0.86 0.90 0.86 0.87  CALCIUM 9.2 9.2 9.2 9.4 9.6  MG  --  1.6* 2.0 1.8 1.6*  PHOS  --   --   --  2.7 2.4*    GFR: Estimated Creatinine Clearance: 105.3 mL/min (by C-G formula based on SCr of 0.87 mg/dL). Recent Labs  Lab 09/11/20 0312 09/11/20 0855 09/12/20 1020 09/13/20 0438 09/14/20 0330 09/15/20 0449  09/16/20 0427  PROCALCITON <0.10  --  <0.10  --   --   --   --   WBC 15.3*  --  18.7* 14.0* 14.2* 14.4* 19.2*  LATICACIDVEN  --  1.2  --   --   --   --   --      Liver Function Tests: Recent Labs  Lab 09/11/20 0312 09/12/20 1020 09/13/20 0438  AST 12* 13* 13*  ALT 11 12 12   ALKPHOS 70 78 70  BILITOT 0.3 0.6 0.7  PROT 7.0 7.5 7.0  ALBUMIN 3.7 3.8 3.5    No results for input(s): LIPASE, AMYLASE in the last 168 hours. No results for input(s): AMMONIA in the last 168 hours.  ABG    Component Value Date/Time   PHART 7.432 09/14/2020 1812   PCO2ART 44.0 09/14/2020 1812   PO2ART 43.4 (L) 09/14/2020 1812   HCO3 27.8 09/14/2020 1812   O2SAT 79.7 09/14/2020 1812      Coagulation Profile: Recent Labs  Lab 09/11/20 0312  INR 0.9     Cardiac Enzymes: No results for input(s): CKTOTAL, CKMB, CKMBINDEX, TROPONINI in the last 168 hours.  HbA1C: Hgb A1c MFr Bld  Date/Time Value Ref Range Status  09/12/2020 04:10 AM 6.4 (H) 4.8 - 5.6 % Final    Comment:    (NOTE)         Prediabetes: 5.7 - 6.4         Diabetes: >6.4         Glycemic control for adults with diabetes: <7.0   07/26/2020 05:59 AM 7.0 (H) 4.8 - 5.6 % Final    Comment:    (NOTE)         Prediabetes: 5.7 - 6.4         Diabetes: >6.4         Glycemic control for adults with diabetes: <7.0     CBG: Recent Labs  Lab 09/15/20 1514 09/15/20 1925 09/15/20 2347 09/16/20 0400 09/16/20 0725  GLUCAP 141* 164* 164* 158* 200*    This patient is critically ill with multiple organ system failure which requires frequent high complexity decision making, assessment, support, evaluation, and titration of therapies. This was completed through the application of advanced monitoring technologies and extensive interpretation of multiple databases. During this encounter critical care time was devoted to patient care services described in this note for 43 minutes.  11/16/20, DO 09/16/20 11:11 AM [facility]

## 2020-09-16 NOTE — Progress Notes (Signed)
eLink Physician-Brief Progress Note Patient Name: Brent [name] DOB: 12/06/1965 MRN: [id number]   Date of Service  09/16/2020  HPI/Events of Note  Patient request for sleep aid. Benadryl last evening not effective.   eICU Interventions  Plan: Melatonin 3 mg per tube Q HS PRN sleep.      Intervention Category Major Interventions: Other:  Lenell Antu 09/16/2020, 9:02 PM

## 2020-09-16 NOTE — Progress Notes (Signed)
Physical Therapy Treatment Patient Details Name: Brent [name] MRN: [id number] DOB: 06-17-1965 Today's Date: 09/16/2020    History of Present Illness 55 y.o. male presenting to ED with SOB, slurred speech and L sided weakness x2-3 days. Patient admitted with acute respiratory failure with hypoxia secondary to aspiration PNA, COPD exacerbation and sepsis. Imaging (+) R ACA CVA x2 within R cingulate gyrus/callosal body, R parietal lob subcortical white matter infarct and questionable acute punctate infarct within L frontal lobe subcortical white matter. Transferred to ICU at [facility] 7/31. PMHx significant for cocaine and tobacco use disorder, lumbago, HTN, HLD, DMII and Hx of CVA.    PT Comments    Patient motivated to participate and able to follow simple commands with Rt extremities and with assist with Left extremities. Patient with weak torso with rt lean in supported sitting and unsupported sitting. Attempted sit to stand with +2 max assist with poor effort by patient. Placed stedy lift in front of pt and pt stood with +2 moderate assistance. He was able to balance on seat of stedy with moderate assist while stedy pivoted from bed to recliner. Patient positioned with pillows supporting him on his rt and lt and with chair alarm underneath him.     Follow Up Recommendations  CIR     Equipment Recommendations  None recommended by PT    Recommendations for Other Services       Precautions / Restrictions Precautions Precautions: Fall Restrictions Weight Bearing Restrictions: No    Mobility  Bed Mobility Overal bed mobility: Needs Assistance Bed Mobility: Supine to Sit     Supine to sit: Max assist;+2 for physical assistance;+2 for safety/equipment     General bed mobility comments: Max A +2 for supine to EOB to R; assist to advance BLE and elevate trunk; patient attempts to reach for rail with LUE and hand over hand assist from RUE but unable to maintain grasp; would likely  require less assist going to L.    Transfers Overall transfer level: Needs assistance Equipment used: 2 person hand held assist;Ambulation equipment used Transfers: Sit to/from [employer] Sit to Stand: Mod assist;+2 physical assistance;+2 safety/equipment Stand pivot transfers: Total assist       General transfer comment: Patient unable to come to stand from ICU bed despite +2 assist; Mod A +2 for sit to stand with use of Stedy and cues for hand placement.Stedy used for transfer to recliner. Patient required support to torso while seated on Stedy, during pivot from bed to chair.  Ambulation/Gait                 Stairs             Wheelchair Mobility    Modified Rankin (Stroke Patients Only)       Balance Overall balance assessment: Needs assistance Sitting-balance support: Feet supported;Single extremity supported Sitting balance-Leahy Scale: Poor Sitting balance - Comments: Reliant on external assist to maintain static sitting balnace at EOB; LOB in all directions. Postural control: Right lateral lean;Left lateral lean;Other (comment) (Anterior lean) Standing balance support: Bilateral upper extremity supported;During functional activity Standing balance-Leahy Scale: Poor Standing balance comment: Reliant on external assist                            Cognition Arousal/Alertness: Awake/alert Behavior During Therapy: WFL for tasks assessed/performed Overall Cognitive Status: No family/caregiver present to determine baseline cognitive functioning  General Comments: Follows 1-step verbal commands with good accuracy; A&Ox3.      Exercises      General Comments General comments (skin integrity, edema, etc.): HHFNC 40L 80% FiO2 with sats >90% throughout session      Pertinent Vitals/Pain Pain Assessment: No/denies pain    Home Living                      Prior Function             PT Goals (current goals can now be found in the care plan section) Acute Rehab PT Goals Patient Stated Goal: return home after rehab Time For Goal Achievement: 09/25/20 Potential to Achieve Goals: Good Progress towards PT goals: Progressing toward goals    Frequency    Min 4X/week      PT Plan Current plan remains appropriate    Co-evaluation PT/OT/SLP Co-Evaluation/Treatment: Yes Reason for Co-Treatment: Complexity of the patient's impairments (multi-system involvement);For patient/therapist safety;To address functional/ADL transfers PT goals addressed during session: Mobility/safety with mobility;Balance;Strengthening/ROM OT goals addressed during session: ADL's and self-care      AM-PAC PT "6 Clicks" Mobility   Outcome Measure  Help needed turning from your back to your side while in a flat bed without using bedrails?: A Lot Help needed moving from lying on your back to sitting on the side of a flat bed without using bedrails?: Total Help needed moving to and from a bed to a chair (including a wheelchair)?: Total Help needed standing up from a chair using your arms (e.g., wheelchair or bedside chair)?: Total Help needed to walk in hospital room?: Total Help needed climbing 3-5 steps with a railing? : Total 6 Click Score: 7    End of Session Equipment Utilized During Treatment: Oxygen;Gait belt Activity Tolerance: Patient tolerated treatment well Patient left: in chair;with call bell/phone within reach;with chair alarm set Nurse Communication: Mobility status PT Visit Diagnosis: Unsteadiness on feet (R26.81);Other abnormalities of gait and mobility (R26.89);Muscle weakness (generalized) (M62.81)     Time: 7425-9563 PT Time Calculation (min) (ACUTE ONLY): 39 min  Charges:  $Therapeutic Activity: 8-22 mins                      Jerolyn Center, PT Pager [phone]    Zena Amos 09/16/2020, 12:59 PM

## 2020-09-16 NOTE — Procedures (Signed)
Cortrak  Person Inserting Tube:  Vici Novick C, RD Tube Type:  Cortrak - 43 inches Tube Size:  10 Tube Location:  Left nare Initial Placement:  Stomach Secured by: Bridle Technique Used to Measure Tube Placement:  Marking at nare/corner of mouth Cortrak Secured At:  68 cm Cortrak Tube Team Note:  Consult received to place a Cortrak feeding tube.   X-ray is required, abdominal x-ray has been ordered by the Cortrak team. Please confirm tube placement before using the Cortrak tube.   If the tube becomes dislodged please keep the tube and contact the Cortrak team at [email or web address] (password [email or web address]) for replacement.  If after hours and replacement cannot be delayed, place a NG tube and confirm placement with an abdominal x-ray.   Christophere Hillhouse P., RD, LDN, CNSC See AMiON for contact information     

## 2020-09-17 ENCOUNTER — Inpatient Hospital Stay (HOSPITAL_COMMUNITY): Payer: Medicaid Other

## 2020-09-17 DIAGNOSIS — J441 Chronic obstructive pulmonary disease with (acute) exacerbation: Secondary | ICD-10-CM | POA: Diagnosis not present

## 2020-09-17 DIAGNOSIS — J9601 Acute respiratory failure with hypoxia: Secondary | ICD-10-CM | POA: Diagnosis not present

## 2020-09-17 DIAGNOSIS — R1310 Dysphagia, unspecified: Secondary | ICD-10-CM | POA: Diagnosis not present

## 2020-09-17 DIAGNOSIS — I639 Cerebral infarction, unspecified: Secondary | ICD-10-CM

## 2020-09-17 DIAGNOSIS — Z515 Encounter for palliative care: Secondary | ICD-10-CM | POA: Diagnosis not present

## 2020-09-17 DIAGNOSIS — Z7189 Other specified counseling: Secondary | ICD-10-CM | POA: Diagnosis not present

## 2020-09-17 DIAGNOSIS — J9621 Acute and chronic respiratory failure with hypoxia: Secondary | ICD-10-CM | POA: Diagnosis not present

## 2020-09-17 LAB — GLUCOSE, CAPILLARY
Glucose-Capillary: 226 mg/dL — ABNORMAL HIGH (ref 70–99)
Glucose-Capillary: 284 mg/dL — ABNORMAL HIGH (ref 70–99)
Glucose-Capillary: 236 mg/dL — ABNORMAL HIGH (ref 70–99)
Glucose-Capillary: 259 mg/dL — ABNORMAL HIGH (ref 70–99)
Glucose-Capillary: 148 mg/dL — ABNORMAL HIGH (ref 70–99)
Glucose-Capillary: 149 mg/dL — ABNORMAL HIGH (ref 70–99)

## 2020-09-17 LAB — BASIC METABOLIC PANEL
Anion gap: 12 (ref 5–15)
BUN: 26 mg/dL — ABNORMAL HIGH (ref 6–20)
CO2: 25 mmol/L (ref 22–32)
Calcium: 9.7 mg/dL (ref 8.9–10.3)
Chloride: 107 mmol/L (ref 98–111)
Creatinine, Ser: 0.88 mg/dL (ref 0.61–1.24)
GFR, Estimated: >60 mL/min (ref >60)
Glucose, Bld: 226 mg/dL — ABNORMAL HIGH (ref 70–99)
Potassium: 3.9 mmol/L (ref 3.5–5.1)
Sodium: 144 mmol/L (ref 135–145)

## 2020-09-17 LAB — CBC
HCT: 43.6 % (ref 39.0–52.0)
Hemoglobin: 14 g/dL (ref 13.0–17.0)
MCH: 31.1 pg (ref 26.0–34.0)
MCHC: 32.1 g/dL (ref 30.0–36.0)
MCV: 96.9 fL (ref 80.0–100.0)
Platelets: 375 10*3/uL (ref 150–400)
RBC: 4.5 MIL/uL (ref 4.22–5.81)
RDW: 13.1 % (ref 11.5–15.5)
WBC: 17.2 10*3/uL — ABNORMAL HIGH (ref 4.0–10.5)
nRBC: 0 % (ref 0.0–0.2)

## 2020-09-17 LAB — MRSA NEXT GEN BY PCR, NASAL: MRSA by PCR Next Gen: NOT DETECTED

## 2020-09-17 LAB — MAGNESIUM
Magnesium: 1.9 mg/dL (ref 1.7–2.4)
Magnesium: 1.7 mg/dL (ref 1.7–2.4)

## 2020-09-17 LAB — PHOSPHORUS
Phosphorus: 2.6 mg/dL (ref 2.5–4.6)
Phosphorus: 2.2 mg/dL — ABNORMAL LOW (ref 2.5–4.6)

## 2020-09-17 IMAGING — DX DG CHEST 1V PORT
1 series · 1 of 1 positions shown · non-contrast
Comparison: [DATE]

CLINICAL DATA: Mucous plugging of bronchi

EXAM:
PORTABLE CHEST 1 VIEW

[chest ap]
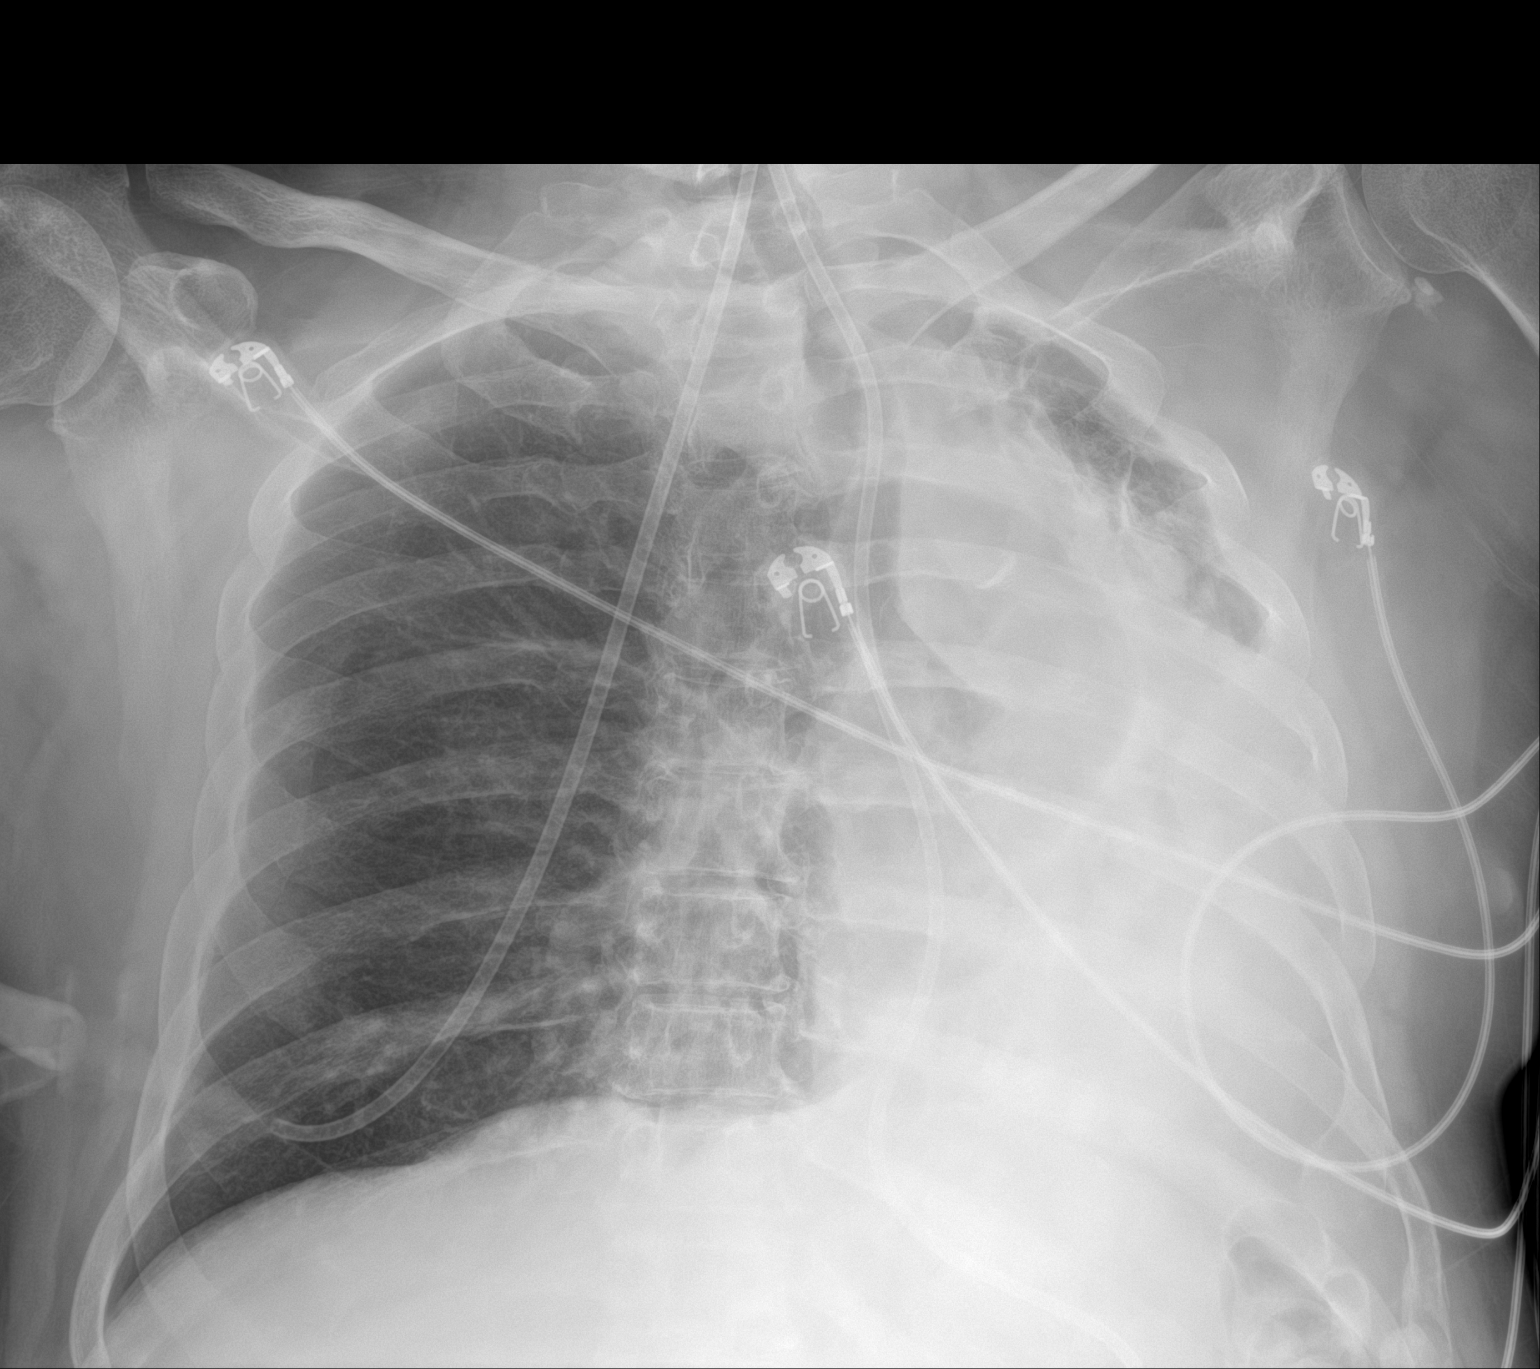

[1 of 1 positions shown; findings below may reference images not displayed]

FINDINGS: There is again seen marked left lung collapse and consolidation with
progressive left-sided volume loss and mediastinal shift to the
left. The left mainstem bronchus demonstrates an intraluminal
filling defect suggesting a central obstructing lesion such as a
mucous plug or aspirated foreign body. The right lung is
hyperinflated and is clear. No pneumothorax. No pleural effusion on
the right. Nasoenteric feeding tube extends in the upper abdomen.
Cardiac silhouette is obscured by atelectatic lung. Pulmonary
vascularity is normal. No acute bone abnormality.
IMPRESSION: Progressive left-sided volume loss with collapse and consolidation
of the left lung and progressive mediastinal shift.

Abrupt cut off of the left mainstem bronchus suggesting a central
obstructing lesion such as a mucous plug or aspirated foreign body.
Bronchoscopic correlation may be helpful.

## 2020-09-17 MED ORDER — POTASSIUM CHLORIDE 20 MEQ PO PACK
20.0000 meq | PACK | Freq: Once | ORAL | Status: AC
  Administered 2020-09-17: 20 meq
  Filled 2020-09-17: qty 1

## 2020-09-17 MED ORDER — VANCOMYCIN HCL 1750 MG/350ML IV SOLN
1750.0000 mg | Freq: Once | INTRAVENOUS | Status: AC
  Administered 2020-09-17: 1750 mg via INTRAVENOUS
  Filled 2020-09-17: qty 350

## 2020-09-17 MED ORDER — VANCOMYCIN HCL 1250 MG/250ML IV SOLN
1250.0000 mg | Freq: Two times a day (BID) | INTRAVENOUS | Status: DC
  Administered 2020-09-18: 1250 mg via INTRAVENOUS
  Filled 2020-09-17 (×2): qty 250

## 2020-09-17 MED ORDER — CEFAZOLIN SODIUM-DEXTROSE 2-4 GM/100ML-% IV SOLN: 2.0000 g | INTRAVENOUS | Status: DC

## 2020-09-17 MED ORDER — METHYLPREDNISOLONE SODIUM SUCC 40 MG IJ SOLR
40.0000 mg | INTRAMUSCULAR | Status: DC
  Administered 2020-09-18: 40 mg via INTRAVENOUS
  Filled 2020-09-17 (×2): qty 1

## 2020-09-17 MED ORDER — GUAIFENESIN 100 MG/5ML PO SOLN
10.0000 mL | ORAL | Status: DC
  Administered 2020-09-17 – 2020-09-27 (×54): 200 mg
  Filled 2020-09-17 (×8): qty 10
  Filled 2020-09-17: qty 20
  Filled 2020-09-17 (×13): qty 10
  Filled 2020-09-17: qty 20
  Filled 2020-09-17 (×2): qty 10
  Filled 2020-09-17: qty 20
  Filled 2020-09-17 (×14): qty 10
  Filled 2020-09-17: qty 20
  Filled 2020-09-17 (×10): qty 10
  Filled 2020-09-17: qty 20

## 2020-09-17 MED ORDER — INSULIN ASPART 100 UNIT/ML IJ SOLN
3.0000 [IU] | INTRAMUSCULAR | Status: DC
  Administered 2020-09-17: 3 [IU] via SUBCUTANEOUS

## 2020-09-17 MED ORDER — ENOXAPARIN SODIUM 40 MG/0.4ML IJ SOSY: 40.0000 mg | PREFILLED_SYRINGE | INTRAMUSCULAR | Status: DC

## 2020-09-17 MED ORDER — POLYETHYLENE GLYCOL 3350 17 G PO PACK
17.0000 g | PACK | Freq: Every day | ORAL | Status: DC
  Administered 2020-09-17 – 2020-09-27 (×8): 17 g
  Filled 2020-09-17 (×8): qty 1

## 2020-09-17 MED ORDER — INSULIN ASPART 100 UNIT/ML IJ SOLN
0.0000 [IU] | INTRAMUSCULAR | Status: DC
  Administered 2020-09-17: 3 [IU] via SUBCUTANEOUS
  Administered 2020-09-17: 7 [IU] via SUBCUTANEOUS
  Administered 2020-09-17: 11 [IU] via SUBCUTANEOUS
  Administered 2020-09-17: 3 [IU] via SUBCUTANEOUS
  Administered 2020-09-18: 4 [IU] via SUBCUTANEOUS
  Administered 2020-09-18: 3 [IU] via SUBCUTANEOUS
  Administered 2020-09-18: 11 [IU] via SUBCUTANEOUS
  Administered 2020-09-18: 7 [IU] via SUBCUTANEOUS
  Administered 2020-09-18 – 2020-09-19 (×3): 3 [IU] via SUBCUTANEOUS
  Administered 2020-09-19: 7 [IU] via SUBCUTANEOUS
  Administered 2020-09-19: 4 [IU] via SUBCUTANEOUS
  Administered 2020-09-19: 11 [IU] via SUBCUTANEOUS
  Administered 2020-09-20 (×2): 7 [IU] via SUBCUTANEOUS
  Administered 2020-09-20: 11 [IU] via SUBCUTANEOUS
  Administered 2020-09-20 (×2): 7 [IU] via SUBCUTANEOUS
  Administered 2020-09-20: 11 [IU] via SUBCUTANEOUS
  Administered 2020-09-21: 7 [IU] via SUBCUTANEOUS
  Administered 2020-09-21: 11 [IU] via SUBCUTANEOUS
  Administered 2020-09-21 (×2): 7 [IU] via SUBCUTANEOUS
  Administered 2020-09-21: 4 [IU] via SUBCUTANEOUS
  Administered 2020-09-21: 3 [IU] via SUBCUTANEOUS
  Administered 2020-09-22: 4 [IU] via SUBCUTANEOUS
  Administered 2020-09-22: 3 [IU] via SUBCUTANEOUS
  Administered 2020-09-22 (×2): 7 [IU] via SUBCUTANEOUS
  Administered 2020-09-22 (×2): 3 [IU] via SUBCUTANEOUS
  Administered 2020-09-23 (×2): 7 [IU] via SUBCUTANEOUS
  Administered 2020-09-23 (×2): 4 [IU] via SUBCUTANEOUS
  Administered 2020-09-23: 3 [IU] via SUBCUTANEOUS
  Administered 2020-09-23 – 2020-09-24 (×3): 4 [IU] via SUBCUTANEOUS
  Administered 2020-09-25 (×3): 7 [IU] via SUBCUTANEOUS
  Administered 2020-09-25: 11 [IU] via SUBCUTANEOUS
  Administered 2020-09-25 – 2020-09-26 (×2): 7 [IU] via SUBCUTANEOUS
  Administered 2020-09-26: 4 [IU] via SUBCUTANEOUS
  Administered 2020-09-26: 11 [IU] via SUBCUTANEOUS
  Administered 2020-09-26: 7 [IU] via SUBCUTANEOUS
  Administered 2020-09-27: 4 [IU] via SUBCUTANEOUS
  Administered 2020-09-27: 11 [IU] via SUBCUTANEOUS
  Administered 2020-09-27: 3 [IU] via SUBCUTANEOUS

## 2020-09-17 MED ORDER — INSULIN DETEMIR 100 UNIT/ML ~~LOC~~ SOLN
15.0000 [IU] | Freq: Two times a day (BID) | SUBCUTANEOUS | Status: DC
  Administered 2020-09-17 (×2): 15 [IU] via SUBCUTANEOUS
  Filled 2020-09-17 (×4): qty 0.15

## 2020-09-17 MED ORDER — SODIUM CHLORIDE 0.9 % IV SOLN: INTRAVENOUS | Status: DC | PRN

## 2020-09-17 MED ORDER — INSULIN ASPART 100 UNIT/ML IJ SOLN
3.0000 [IU] | INTRAMUSCULAR | Status: DC
  Administered 2020-09-17 – 2020-09-27 (×49): 3 [IU] via SUBCUTANEOUS

## 2020-09-17 NOTE — Progress Notes (Signed)
Attempted to NTS patient with RN at bedside. Patient would not tolerate.Placed patient on cpt with the vest.

## 2020-09-17 NOTE — Progress Notes (Signed)
Sputum sample obtained and sent to lab.  

## 2020-09-17 NOTE — Progress Notes (Signed)
NAME:  Brent [name], MRN:  [id number], DOB:  Jul 08, 1965, LOS: 6 ADMISSION DATE:  09/11/2020, CONSULTATION DATE:  7/31 REFERRING MD:  Tat, CHIEF COMPLAINT:  respiratory failure    History of Present Illness:  55 year old male patient with history as mentioned below presented from home with chief complaint of shortness of breath and new onset of worsening left-sided weakness and facial droop .  Telemetry neurology consult was obtained.  CT head and neck completed there was no acute hemorrhage or core infarct he was admitted to the inpatient service with working diagnosis of acute respiratory failure secondary to aspiration pneumonia, sepsis, and new stroke he last smoked cocaine on 7/25. Hospital course Was treated at [facility] for the above, from 7/25 through 7/31 Reason for medical records he had continued episodes of desaturation, a barium swallow was completed on 7/28 which showed moderate pharyngeal phase dysphagia and witnessed aspiration.  He was seen on 7/29 by palliative care medicine for goals of care at this point verified full code, with plan that he would likely need a G-tube for alternative nutrition.  On 7/29 he began to require increased supplemental oxygen, with titration up to as high as 15 L high flow serial chest x-ray imaging on day of admission showed fairly clear chest x-ray however subsequently scale imaging on CT scan 7/28 new left lower lobe consolidation, and subsequent plain imaging on July 30 showed progressive and marked left-sided atelectasis and then on the 31st complete opacification of left hemithorax.  Because of her worsening chest x-ray, and increased oxygen needs he was transferred to [facility] for possible bronchoscopy  Pertinent  Medical History  Hypertension, hyperlipidemia, diabetes type 2, tobacco abuse.  Stroke .  With left hemiparesis.  Cocaine abuse.  OSA. GERD.  Prior seizure disorder.  Prior tracheostomy (remote), and not able to tolerate  CPAP. Significant Hospital Events: Including procedures, antibiotic start and stop dates in addition to other pertinent events   7/25 admitted with aspiration pneumonia and new stroke with worsening left-sided left hemiparesis and facial droop as well as slurred speech.  This was over a 2 to 3-day period of time, in regards to the weakness so was not a tPA or neuro IR candidate. He was admitted therapeutic interventions included antiplatelet therapy, for stroke.  He was placed on azithromycin and Zosyn. 7/28 increasing oxygen needs.  Barium swallow demonstrating aspiration.  Interventional radiology consulted for possible PEG. 7/29 ongoing increased oxygen needs.  Goals of care conversation with palliative care.  Patient wanting full code okay with PEG tube if needed 7/30 through 7/31: Increasing oxygen requirements, worsening left-sided atelectasis to the point of complete opacification of the left hemithorax on time of transfer to [facility] on 7/31.  Azithromycin discontinued.  Interim History / Subjective:  Desaturation episode overnight. Had been weaned down to 4L [place] yesterday. Has continued getting CPT, NTS.   Objective   Blood pressure 114/68, pulse 75, temperature 98.6 F (37 C), temperature source Oral, resp. rate 18, height 6' (1.829 m), weight 82.1 kg, SpO2 91 %.    FiO2 (%):  [60 %-100 %] 100 %   Intake/Output Summary (Last 24 hours) at 09/17/2020 0814 Last data filed at 09/17/2020 0700 Gross per 24 hour  Intake 787.19 ml  Output 1060 ml  Net -272.81 ml    Filed Weights   09/15/20 0343 09/15/20 0500 09/17/20 0500  Weight: 86.5 kg 89.5 kg 82.1 kg    Examination: General: ill appearing man lying in  bed no acute distress HENT: [place]/AT, mild facial droop Neck: Previously healed trach scar Lungs: Rhonchi on the left, clear on the right.  Only grunts with requested cough, but strong cough with NTS.  Mild tachypnea, no accessory muscle use.  On heated high flow nasal cannula.   Purulent secretions with NTS. Cardiovascular: S1-S2 regular rate and rhythm Abdomen: Soft, nontender, nondistended Extremities: No cyanosis or edema Neuro: Awake, alert, not moving the left side, localizing on the right. Derm:, Dry, no rashes  Cxr personally reviewed> still significant consolidation on the left, improving upper peripheral aeration.  WBC 17.2   Resolved Hospital Problem list    Sepsis.  Assessment & Plan:  Acute hypoxic respiratory failure secondary to aspiration pneumonia/mucous plugging with complete collapse of the left lung. Has demonstrated significant impulsivity and noncompliance with dietary restrictions--track placement today.  He is to have very poor cough mechanics. Dysphagia with aspiration identified on modified barium swallow 7/28 Plan - Needs ongoing ICU monitoring for aggressive respiratory support.  Remains high risk for intubation. Intubation seems risky given that he will be hard to get off the vent with such poor respiratory mechanics. -adding mucinex BID -Continue hypertonic saline, CPT, NTS.  Will place nasal trumpet today to facilitate more frequent NTS. -Continue Zosyn; sending trach aspirate culture today -Con't supplemental O2, wean as tolerated  History of tobacco abuse, with COPD exacerbation. Previously is followed by Dr. Maple Hudson  -Continue bronchodilators - Steroids tapering- decrease to once daily dosing today.   History of obstructive sleep apnea- AHI 7.8.  Not on CPAP PTA. -Nocturnal supplemental oxygen  Acute ischemic stroke involving the right posterior temporal occipital lobe.  Partially exacerbated by cocaine abuse. -Continue neuro monitoring -Seen by neurology admission, eventually will resume dual antiplatelet therapy with aspirin and Plavix, these are currently on hold pending NGT/ PEG tube placement.  -Will likely need CIR when more stable  History of chronic back pain and polysubstance abuse -Continuing gabapentin when  more stable from a pulmonary standpoint. -As needed Tylenol PR  History of hypertension -Okay to continue mild permissive hypertension.  Can resume metoprolol and benazepril slowly and monitor for worsening neuro symptoms.  Hyperlipidemia -Cont' PTA Crestor and fenofibrate  Diabetes type 2 with hyperglycemia -Continue sliding scale insulin as needed-increase to resistant scale - Goal BG 140-180 - Increase levemir to 15 units BID  H/o seizure d/o Plan -con't PTA tegretol  Tobacco abuse -nicotine replacement therapy  -recommend cessation   Best Practice (right click and "Reselect all SmartList Selections" daily)   Diet/type: NPO DVT prophylaxis: LMWH GI prophylaxis: N/A Lines: N/A Foley:  N/A Code Status:  full code Last date of multidisciplinary goals of care discussion [7/28]  Labs   CBC: Recent Labs  Lab 09/11/20 0312 09/12/20 1020 09/13/20 0438 09/14/20 0330 09/15/20 0449 09/16/20 0427 09/17/20 0504  WBC 15.3*   < > 14.0* 14.2* 14.4* 19.2* 17.2*  NEUTROABS 12.3*  --   --   --   --   --   --   HGB 13.2   < > 13.8 13.7 14.3 14.5 14.0  HCT 40.0   < > 41.6 41.8 44.0 44.5 43.6  MCV 98.5   < > 97.4 98.1 98.4 96.1 96.9  PLT 276   < > 298 325 315 415* 375   < > = values in this interval not displayed.     Basic Metabolic Panel: Recent Labs  Lab 09/13/20 0438 09/14/20 0330 09/15/20 0449 09/16/20 0427 09/16/20 1632 09/17/20 7628  NA 141 142 145 145  --  144  K 3.6 3.9 3.9 3.7  --  3.9  CL 104 104 107 109  --  107  CO2 27 28 28 27   --  25  GLUCOSE 137* 142* 148* 161*  --  226*  BUN 22* 25* 29* 24*  --  26*  CREATININE 0.86 0.90 0.86 0.87  --  0.88  CALCIUM 9.2 9.2 9.4 9.6  --  9.7  MG 1.6* 2.0 1.8 1.6* 2.3 1.9  PHOS  --   --  2.7 2.4* 2.1* 2.6    GFR: Estimated Creatinine Clearance: 104.1 mL/min (by C-G formula based on SCr of 0.88 mg/dL). Recent Labs  Lab 09/11/20 0312 09/11/20 0855 09/12/20 1020 09/13/20 0438 09/14/20 0330 09/15/20 0449  09/16/20 0427 09/17/20 0504  PROCALCITON <0.10  --  <0.10  --   --   --   --   --   WBC 15.3*  --  18.7*   < > 14.2* 14.4* 19.2* 17.2*  LATICACIDVEN  --  1.2  --   --   --   --   --   --    < > = values in this interval not displayed.     Liver Function Tests: Recent Labs  Lab 09/11/20 0312 09/12/20 1020 09/13/20 0438  AST 12* 13* 13*  ALT 11 12 12   ALKPHOS 70 78 70  BILITOT 0.3 0.6 0.7  PROT 7.0 7.5 7.0  ALBUMIN 3.7 3.8 3.5    No results for input(s): LIPASE, AMYLASE in the last 168 hours. No results for input(s): AMMONIA in the last 168 hours.  ABG    Component Value Date/Time   PHART 7.432 09/14/2020 1812   PCO2ART 44.0 09/14/2020 1812   PO2ART 43.4 (L) 09/14/2020 1812   HCO3 27.8 09/14/2020 1812   O2SAT 79.7 09/14/2020 1812      Coagulation Profile: Recent Labs  Lab 09/11/20 0312  INR 0.9     Cardiac Enzymes: No results for input(s): CKTOTAL, CKMB, CKMBINDEX, TROPONINI in the last 168 hours.  HbA1C: Hgb A1c MFr Bld  Date/Time Value Ref Range Status  09/12/2020 04:10 AM 6.4 (H) 4.8 - 5.6 % Final    Comment:    (NOTE)         Prediabetes: 5.7 - 6.4         Diabetes: >6.4         Glycemic control for adults with diabetes: <7.0   07/26/2020 05:59 AM 7.0 (H) 4.8 - 5.6 % Final    Comment:    (NOTE)         Prediabetes: 5.7 - 6.4         Diabetes: >6.4         Glycemic control for adults with diabetes: <7.0     CBG: Recent Labs  Lab 09/16/20 1110 09/16/20 1522 09/16/20 1904 09/16/20 2323 09/17/20 0711  GLUCAP 178* 144* 185* 240* 284*    This patient is critically ill with multiple organ system failure which requires frequent high complexity decision making, assessment, support, evaluation, and titration of therapies. This was completed through the application of advanced monitoring technologies and extensive interpretation of multiple databases. During this encounter critical care time was devoted to patient care services described in this  note for 41 minutes.  11/16/20, DO 09/17/20 8:37 AM [facility]

## 2020-09-17 NOTE — Consult Note (Signed)
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 "I know what I need to do." (stop drugs) Assessment / Plan / Recommendation [facility] IP CLINICAL IMPRESSIONS 09/12/2020 Clinical Impression Pt presents with moderate oral phase and moderate pharyngeal phase dysphagia which result in consistent episodes of aspiration in small amounts, but are not able to be cleared by Pt despite weak, delayed cough. Oral phase is marked by weak lingual manipulation and bolus cohesion and pharyngeal phase is marked by variable delay in swallow trigger, but generally after filling the valleculae and adequate hyolaryngeal excursion, but poor laryngeal vestibule closure resulting in aspiration in trace amounts during the swallow and small amounts after the swallow from oral lingual residuals spilling directly into laryngeal vestibule and along anterior portion of trachea. Pt with very little pharyngeal residue from the initial bolus that reaches the pharynx, but consistent aspiration of lingual residuals after the swallow. Pt with occasional delayed and  weak cough. He was unable to expel aspirates from his trachea. Pt with known dysphagia before these acute strokes and is now made worse and in setting of PNA and poor cough. Pt is at high risk for aspiration in small amounts, but over the course of a meal could be quite substantial. Family and Pt are discussing possibility of PEG tube while attempting to rehab. Recommend NPO with alternative means of nutrition and dysphagia therapy with small bites of puree and work on cough and oral phase. SLP will follow per goals of care. Continue oral care and can offer single ice chips for comfort after oral care with RN. Above to Dr. Arbutus Leas and RN. SLP Visit Diagnosis Dysphagia, oropharyngeal phase (R13.12) Attention and concentration deficit following -- Frontal lobe and executive function deficit following -- Impact on safety and function Moderate aspiration risk;Risk for inadequate nutrition/hydration   [facility] IP TREATMENT RECOMMENDATION 09/12/2020 Treatment Recommendations Therapy as outlined in treatment plan below   Prognosis 09/12/2020 Prognosis for Safe Diet Advancement Guarded Barriers to Reach Goals Severity of deficits;Time post onset Barriers/Prognosis Comment -- [facility] IP DIET RECOMMENDATION 09/12/2020 SLP Diet Recommendations NPO;Alternative means - long-term Liquid Administration via -- Medication Administration Via alternative means Compensations Slow rate;Small sips/bites;Follow solids with liquid Postural Changes --   [facility] IP OTHER RECOMMENDATIONS 09/12/2020 Recommended Consults --  Oral Care Recommendations Oral care QID Other Recommendations --   [facility] IP FOLLOW UP RECOMMENDATIONS 09/12/2020 Follow up Recommendations Skilled Nursing facility   [facility] IP FREQUENCY AND DURATION 09/12/2020 Speech Therapy Frequency (ACUTE ONLY) min 3x week Treatment Duration 1 week      [facility] IP ORAL PHASE 09/12/2020 Oral Phase Impaired Oral - Pudding Teaspoon -- Oral - Pudding Cup -- Oral - Honey Teaspoon Weak lingual manipulation;Incomplete tongue to  palate contact;Lingual/palatal residue;Piecemeal swallowing;Delayed oral transit;Decreased bolus cohesion;Premature spillage Oral - Honey Cup -- Oral - Nectar Teaspoon -- Oral - Nectar Cup -- Oral - Nectar Straw -- Oral - Thin Teaspoon -- Oral - Thin Cup -- Oral - Thin Straw -- Oral - Puree Weak lingual manipulation;Incomplete tongue to palate contact;Reduced posterior propulsion;Lingual/palatal residue;Delayed oral transit;Decreased bolus cohesion Oral - Mech Soft -- Oral - Regular -- Oral - Multi-Consistency -- Oral - Pill -- Oral Phase - Comment --  [facility] IP PHARYNGEAL PHASE 09/12/2020 Pharyngeal Phase Impaired Pharyngeal- Pudding Teaspoon -- Pharyngeal -- Pharyngeal- Pudding Cup -- Pharyngeal -- Pharyngeal- Honey Teaspoon Delayed swallow initiation-vallecula;Reduced airway/laryngeal closure;Penetration/Aspiration during swallow;Penetration/Apiration after swallow;Trace aspiration;Moderate aspiration Pharyngeal Material enters airway, passes BELOW cords and not ejected out despite cough attempt by patient Pharyngeal- Honey Cup -- Pharyngeal -- Pharyngeal- Nectar Teaspoon -- Pharyngeal -- Pharyngeal- Nectar Cup -- Pharyngeal -- Pharyngeal- Nectar Straw -- Pharyngeal -- Pharyngeal- Thin Teaspoon -- Pharyngeal -- Pharyngeal- Thin Cup -- Pharyngeal -- Pharyngeal- Thin Straw -- Pharyngeal -- Pharyngeal- Puree Delayed swallow initiation-vallecula;Reduced airway/laryngeal closure;Penetration/Aspiration during swallow;Penetration/Apiration after swallow;Trace aspiration Pharyngeal Material enters airway, passes BELOW cords and not ejected out despite cough attempt by patient Pharyngeal- Mechanical Soft -- Pharyngeal -- Pharyngeal- Regular -- Pharyngeal -- Pharyngeal- Multi-consistency -- Pharyngeal -- Pharyngeal- Pill -- Pharyngeal -- Pharyngeal Comment --  [facility] IP CERVICAL ESOPHAGEAL PHASE 09/12/2020 Cervical Esophageal Phase WFL Pudding Teaspoon -- Pudding Cup -- Honey Teaspoon -- Honey Cup -- Nectar Teaspoon -- Nectar Cup  -- Nectar Straw -- Thin Teaspoon -- Thin Cup -- Thin Straw -- Puree -- Mechanical Soft -- Regular -- Multi-consistency -- Pill -- Cervical Esophageal Comment -- Thank you, Havery Moros, CCC-SLP [phone] PORTER,DABNEY 09/12/2020, 3:32 PM              ECHOCARDIOGRAM COMPLETE BUBBLE STUDY  Result Date: 09/13/2020    ECHOCARDIOGRAM REPORT   Patient Name:   PRATT BRESS Date of Exam: 09/13/2020 Medical Rec #:  [id number]         Height:       72.0 in Accession #:    [id number]        Weight:       193.6 lb Date of Birth:  [birth year]        BSA:          2.101 m Patient Age:    54 years          BP:           163/93 mmHg Patient Gender: M                 HR:           92 bpm. Exam [facility] Procedure: 2D Echo, Cardiac Doppler and Limited Color Doppler Indications:    Stroke  History:        Patient has prior history of Echocardiogram examinations, most                 recent 07/26/2020. CAD, Stroke and COPD; Risk  Factors:Hypertension, Diabetes and Dyslipidemia. Cocaine abuse.  Sonographer:    Mikki Harbor Referring Phys: [phone] KOFI DOONQUAH IMPRESSIONS  1. Left ventricular ejection fraction, by estimation, is 65 to 70%. The left ventricle has normal function. The left ventricle has no regional wall motion abnormalities. There is moderate left ventricular hypertrophy. Left ventricular diastolic parameters are indeterminate.  2. Right ventricular systolic function is normal. The right ventricular size is normal.  3. The mitral valve is normal in structure. No evidence of mitral valve regurgitation. No evidence of mitral stenosis.  4. The aortic valve has an indeterminant number of cusps. There is mild calcification of the aortic valve. There is mild thickening of the aortic valve. Aortic valve regurgitation is not visualized. No aortic stenosis is present.  5. Agitated saline contrast bubble study was negative, with no evidence of any interatrial shunt. FINDINGS  Left Ventricle:  Left ventricular ejection fraction, by estimation, is 65 to 70%. The left ventricle has normal function. The left ventricle has no regional wall motion abnormalities. The left ventricular internal cavity size was normal in size. There is  moderate left ventricular hypertrophy. Left ventricular diastolic parameters are indeterminate. Right Ventricle: The right ventricular size is normal. No increase in right ventricular wall thickness. Right ventricular systolic function is normal. Left Atrium: Left atrial size was normal in size. Right Atrium: Right atrial size was normal in size. Pericardium: There is no evidence of pericardial effusion. Mitral Valve: The mitral valve is normal in structure. No evidence of mitral valve regurgitation. No evidence of mitral valve stenosis. MV peak gradient, 3.5 mmHg. The mean mitral valve gradient is 2.0 mmHg. Tricuspid Valve: The tricuspid valve is normal in structure. Tricuspid valve regurgitation is not demonstrated. No evidence of tricuspid stenosis. Aortic Valve: The aortic valve has an indeterminant number of cusps. There is mild calcification of the aortic valve. There is mild thickening of the aortic valve. There is mild aortic valve annular calcification. Aortic valve regurgitation is not visualized. No aortic stenosis is present. Aortic valve mean gradient measures 5.0 mmHg. Aortic valve peak gradient measures 8.8 mmHg. Aortic valve area, by VTI measures 2.31 cm. Pulmonic Valve: The pulmonic valve was not well visualized. Pulmonic valve regurgitation is not visualized. No evidence of pulmonic stenosis. Aorta: The aortic root is normal in size and structure. IAS/Shunts: No atrial level shunt detected by color flow Doppler. Agitated saline contrast was given intravenously to evaluate for intracardiac shunting. Agitated saline contrast bubble study was negative, with no evidence of any interatrial shunt.  LEFT VENTRICLE PLAX 2D LVIDd:         4.18 cm  Diastology LVIDs:          2.51 cm  LV e' medial:    14.10 cm/s LV PW:         1.44 cm  LV E/e' medial:  4.1 LV IVS:        1.31 cm  LV e' lateral:   8.06 cm/s LVOT diam:     2.00 cm  LV E/e' lateral: 7.2 LV SV:         72 LV SV Index:   34 LVOT Area:     3.14 cm  RIGHT VENTRICLE RV Basal diam:  3.38 cm RV Mid diam:    3.53 cm RV S prime:     15.80 cm/s TAPSE (M-mode): 2.4 cm LEFT ATRIUM             Index       RIGHT ATRIUM  Index LA diam:        3.80 cm 1.81 cm/m  RA Area:     15.50 cm LA Vol (A2C):   68.2 ml 32.46 ml/m RA Volume:   42.50 ml  20.23 ml/m LA Vol (A4C):   45.9 ml 21.85 ml/m LA Biplane Vol: 55.7 ml 26.51 ml/m  AORTIC VALVE AV Area (Vmax):    2.42 cm AV Area (Vmean):   2.51 cm AV Area (VTI):     2.31 cm AV Vmax:           148.00 cm/s AV Vmean:          110.000 cm/s AV VTI:            0.312 m AV Peak Grad:      8.8 mmHg AV Mean Grad:      5.0 mmHg LVOT Vmax:         114.00 cm/s LVOT Vmean:        87.900 cm/s LVOT VTI:          0.229 m LVOT/AV VTI ratio: 0.73  AORTA Ao Root diam: 3.80 cm MITRAL VALVE MV Area (PHT): 3.68 cm    SHUNTS MV Area VTI:   3.31 cm    Systemic VTI:  0.23 m MV Peak grad:  3.5 mmHg    Systemic Diam: 2.00 cm MV Mean grad:  2.0 mmHg MV Vmax:       0.94 m/s MV Vmean:      61.3 cm/s MV Decel Time: 206 msec MV E velocity: 57.80 cm/s MV A velocity: 92.10 cm/s MV E/A ratio:  0.63 Dina Rich MD Electronically signed by Dina Rich MD Signature Date/Time: 09/13/2020/3:23:06 PM    Final    CT HEAD CODE STROKE WO CONTRAST`  Result Date: 09/11/2020 CLINICAL DATA:  Code stroke. Initial evaluation for acute left-sided weakness. EXAM: CT ANGIOGRAPHY HEAD AND NECK CT PERFUSION BRAIN TECHNIQUE: Multidetector CT imaging of the head and neck was performed using the standard protocol during bolus administration of intravenous contrast. Multiplanar CT image reconstructions and MIPs were obtained to evaluate the vascular anatomy. Carotid stenosis measurements (when applicable) are obtained  utilizing NASCET criteria, using the distal internal carotid diameter as the denominator. Multiphase CT imaging of the brain was performed following IV bolus contrast injection. Subsequent parametric perfusion maps were calculated using RAPID software. CONTRAST:  OMNIPAQUE IOHEXOL 350 MG/ML SOLN COMPARISON:  Prior MRI from 07/25/2020. FINDINGS: CT HEAD FINDINGS Brain: Generalized cerebral atrophy with advanced chronic microvascular ischemic disease. Multiple remote lacunar infarcts present about the bilateral basal ganglia and thalami. Chronic right frontal infarct, right ACA distribution. No acute intracranial hemorrhage. No visible acute large vessel territory infarct. No mass lesion, mass effect, or midline shift. No hydrocephalus or extra-axial fluid collection. Vascular: No hyperdense vessel. Scattered vascular calcifications noted within the carotid siphons. Skull: Scalp soft tissues demonstrate no acute finding. Calvarium intact. Sinuses/Orbits: Globes and orbital soft tissues demonstrate no acute finding. Mild scattered mucosal thickening noted within the ethmoidal air cells and maxillary sinuses. Paranasal sinuses are otherwise clear. No mastoid effusion. Other: None. ASPECTS (Alberta Stroke Program Early CT Score) - Ganglionic level infarction (caudate, lentiform nuclei, internal capsule, insula, M1-M3 cortex): 7 - Supraganglionic infarction (M4-M6 cortex): 3 Total score (0-10 with 10 being normal): 10 Review of the MIP images confirms the above findings CTA NECK FINDINGS Aortic arch: Visualized aortic arch normal caliber with normal 3 vessel morphology. Mild atheromatous change about the arch and origin of the great vessels  without hemodynamically significant stenosis. Right carotid system: Right CCA patent from its origin to the bifurcation without stenosis. Concentric mixed plaque about the right carotid bulb/proximal right ICA with mild 35% stenosis by NASCET criteria. Right ICA patent distally  without stenosis, dissection or occlusion. Left carotid system: Scattered eccentric plaque within the mid left CCA with associated narrowing of up to 40% by NASCET criteria. Bulky calcified plaque about the left carotid bulb/proximal left ICA with associated stenosis of up to 55% by NASCET criteria. Left ICA patent distally without stenosis, dissection or occlusion. Vertebral arteries: Both vertebral arteries arise from the subclavian arteries. No proximal subclavian artery stenosis. Vertebral arteries patent without stenosis, dissection or occlusion. Skeleton: No visible acute osseous finding. No discrete or worrisome osseous lesions. Other neck: No other acute soft tissue abnormality within the neck. No mass or adenopathy. Retained metallic density noted within the subcutaneous fat at the right face. Upper chest: Emphysematous changes noted within the visualized lungs. Few scattered patchy densities noted within the posterior left upper lobe, suspicious for infection/pneumonia. Visualized upper chest demonstrates no other acute finding. Review of the MIP images confirms the above findings CTA HEAD FINDINGS Anterior circulation: Petrous segments patent bilaterally. Mild atheromatous change within the carotid siphons without significant stenosis. A1 segments widely patent. Normal anterior communicating artery complex. Anterior cerebral arteries patent to their distal aspects without stenosis. No M1 stenosis or occlusion. Normal MCA bifurcations. No visible proximal MCA branch occlusion. Distal MCA branches well perfused and symmetric. Posterior circulation: Both V4 segments patent to the vertebrobasilar junction without stenosis. Right vertebral artery slightly dominant. Left PICA patent. Right PICA not seen. Basilar patent to its distal aspect without stenosis. Superior cerebral arteries patent bilaterally. Both PCAs primarily supplied via the basilar well perfused to their distal aspects. Venous sinuses: Grossly  patent allowing for timing the contrast bolus. Anatomic variants: None significant.  No aneurysm. Review of the MIP images confirms the above findings CT Brain Perfusion Findings: ASPECTS: 10. CBF (<30%) Volume: 0mL Perfusion (Tmax>6.0s) volume: 7mL Mismatch Volume: 7mL Infarction Location:No acute core infarct by CT perfusion. 7 cc did area of delayed perfusion involving the posterior right temporal occipital region. IMPRESSION: CT HEAD IMPRESSION: 1. No acute intracranial abnormality. 2. Aspects = 10. 3. Age-related cerebral atrophy with advanced chronic microvascular ischemic disease, with multiple remote lacunar infarcts about the bilateral basal ganglia and thalami, with additional chronic right frontal infarct. CTA HEAD AND NECK IMPRESSION: 1. Negative CTA for emergent large vessel occlusion. 2. Atheromatous change about the carotid bifurcations with associated stenosis of up to 55% on the left and 35% on the right. 3. Additional mild atheromatous change elsewhere about the major arterial vasculature of the head and neck as above. No other proximal high-grade or correctable stenosis. 4. Mild scattered patchy and nodular densities within the partially visualized left upper lobe, suspicious for acute infection/pneumonia. 5.  Emphysema (ICD10-J43.9). CT PERFUSION IMPRESSION: 1. No evidence for acute core infarct by CT perfusion. 2. 7 cc area of delayed perfusion within the posterior right temporoccipital region, which could reflect an area of all vein ischemia. Correlation with dedicated brain MRI suggested as warranted. Critical Value/emergent results were called by telephone at the time of interpretation on 09/11/2020 at 3:50 am to provider [facility] , who verbally acknowledged these results. Electronically Signed   By: Rise Mu M.D.   On: 09/11/2020 04:14   CT ANGIO HEAD NECK W WO CM W PERF (CODE STROKE)  Result Date: 09/11/2020 CLINICAL DATA:  Code  stroke. Initial evaluation for acute  left-sided weakness. EXAM: CT ANGIOGRAPHY HEAD AND NECK CT PERFUSION BRAIN TECHNIQUE: Multidetector CT imaging of the head and neck was performed using the standard protocol during bolus administration of intravenous contrast. Multiplanar CT image reconstructions and MIPs were obtained to evaluate the vascular anatomy. Carotid stenosis measurements (when applicable) are obtained utilizing NASCET criteria, using the distal internal carotid diameter as the denominator. Multiphase CT imaging of the brain was performed following IV bolus contrast injection. Subsequent parametric perfusion maps were calculated using RAPID software. CONTRAST:  100mL OMNIPAQUE IOHEXOL 350 MG/ML SOLN COMPARISON:  Prior MRI from 07/25/2020. FINDINGS: CT HEAD FINDINGS Brain: Generalized cerebral atrophy with advanced chronic microvascular ischemic disease. Multiple remote lacunar infarcts present about the bilateral basal ganglia and thalami. Chronic right frontal infarct, right ACA distribution. No acute intracranial hemorrhage. No visible acute large vessel territory infarct. No mass lesion, mass effect, or midline shift. No hydrocephalus or extra-axial fluid collection. Vascular: No hyperdense vessel. Scattered vascular calcifications noted within the carotid siphons. Skull: Scalp soft tissues demonstrate no acute finding. Calvarium intact. Sinuses/Orbits: Globes and orbital soft tissues demonstrate no acute finding. Mild scattered mucosal thickening noted within the ethmoidal air cells and maxillary sinuses. Paranasal sinuses are otherwise clear. No mastoid effusion. Other: None. ASPECTS (Alberta Stroke Program Early CT Score) - Ganglionic level infarction (caudate, lentiform nuclei, internal capsule, insula, M1-M3 cortex): 7 - Supraganglionic infarction (M4-M6 cortex): 3 Total score (0-10 with 10 being normal): 10 Review of the MIP images confirms the above findings CTA NECK FINDINGS Aortic arch: Visualized aortic arch normal caliber  with normal 3 vessel morphology. Mild atheromatous change about the arch and origin of the great vessels without hemodynamically significant stenosis. Right carotid system: Right CCA patent from its origin to the bifurcation without stenosis. Concentric mixed plaque about the right carotid bulb/proximal right ICA with mild 35% stenosis by NASCET criteria. Right ICA patent distally without stenosis, dissection or occlusion. Left carotid system: Scattered eccentric plaque within the mid left CCA with associated narrowing of up to 40% by NASCET criteria. Bulky calcified plaque about the left carotid bulb/proximal left ICA with associated stenosis of up to 55% by NASCET criteria. Left ICA patent distally without stenosis, dissection or occlusion. Vertebral arteries: Both vertebral arteries arise from the subclavian arteries. No proximal subclavian artery stenosis. Vertebral arteries patent without stenosis, dissection or occlusion. Skeleton: No visible acute osseous finding. No discrete or worrisome osseous lesions. Other neck: No other acute soft tissue abnormality within the neck. No mass or adenopathy. Retained metallic density noted within the subcutaneous fat at the right face. Upper chest: Emphysematous changes noted within the visualized lungs. Few scattered patchy densities noted within the posterior left upper lobe, suspicious for infection/pneumonia. Visualized upper chest demonstrates no other acute finding. Review of the MIP images confirms the above findings CTA HEAD FINDINGS Anterior circulation: Petrous segments patent bilaterally. Mild atheromatous change within the carotid siphons without significant stenosis. A1 segments widely patent. Normal anterior communicating artery complex. Anterior cerebral arteries patent to their distal aspects without stenosis. No M1 stenosis or occlusion. Normal MCA bifurcations. No visible proximal MCA branch occlusion. Distal MCA branches well perfused and symmetric.  Posterior circulation: Both V4 segments patent to the vertebrobasilar junction without stenosis. Right vertebral artery slightly dominant. Left PICA patent. Right PICA not seen. Basilar patent to its distal aspect without stenosis. Superior cerebral arteries patent bilaterally. Both PCAs primarily supplied via the basilar well perfused to their distal aspects. Venous sinuses: Grossly patent  allowing for timing the contrast bolus. Anatomic variants: None significant.  No aneurysm. Review of the MIP images confirms the above findings CT Brain Perfusion Findings: ASPECTS: 10. CBF (<30%) Volume: 0mL Perfusion (Tmax>6.0s) volume: 7mL Mismatch Volume: 7mL Infarction Location:No acute core infarct by CT perfusion. 7 cc did area of delayed perfusion involving the posterior right temporal occipital region. IMPRESSION: CT HEAD IMPRESSION: 1. No acute intracranial abnormality. 2. Aspects = 10. 3. Age-related cerebral atrophy with advanced chronic microvascular ischemic disease, with multiple remote lacunar infarcts about the bilateral basal ganglia and thalami, with additional chronic right frontal infarct. CTA HEAD AND NECK IMPRESSION: 1. Negative CTA for emergent large vessel occlusion. 2. Atheromatous change about the carotid bifurcations with associated stenosis of up to 55% on the left and 35% on the right. 3. Additional mild atheromatous change elsewhere about the major arterial vasculature of the head and neck as above. No other proximal high-grade or correctable stenosis. 4. Mild scattered patchy and nodular densities within the partially visualized left upper lobe, suspicious for acute infection/pneumonia. 5.  Emphysema (ICD10-J43.9). CT PERFUSION IMPRESSION: 1. No evidence for acute core infarct by CT perfusion. 2. 7 cc area of delayed perfusion within the posterior right temporoccipital region, which could reflect an area of all vein ischemia. Correlation with dedicated brain MRI suggested as warranted. Critical  Value/emergent results were called by telephone at the time of interpretation on 09/11/2020 at 3:50 am to provider [facility] , who verbally acknowledged these results. Electronically Signed   By: Rise Mu M.D.   On: 09/11/2020 04:14    Labs:  CBC: Recent Labs    09/14/20 0330 09/15/20 0449 09/16/20 0427 09/17/20 0504  WBC 14.2* 14.4* 19.2* 17.2*  HGB 13.7 14.3 14.5 14.0  HCT 41.8 44.0 44.5 43.6  PLT 325 315 415* 375    COAGS: Recent Labs    09/11/20 0312  INR 0.9  APTT 31    BMP: Recent Labs    11/03/19 0918 11/05/19 0649 07/25/20 1307 09/14/20 0330 09/15/20 0449 09/16/20 0427 09/17/20 0504  NA 137 140   < > 142 145 145 144  K 3.8 3.8   < > 3.9 3.9 3.7 3.9  CL 102 100   < > 104 107 109 107  CO2 26 31   < > GLUCOSE 147* 173*   < > 142* 148* 161* 226*  BUN 20 18   < > 25* 29* 24* 26*  CALCIUM 9.0 9.2   < > 9.2 9.4 9.6 9.7  CREATININE 0.87 0.79   < > 0.90 0.86 0.87 0.88  GFRNONAA >60 >60   < > >60 >60 >60 >60  GFRAA >60 >60  --   --   --   --   --    < > = values in this interval not displayed.    LIVER FUNCTION TESTS: Recent Labs    08/12/20 1437 09/11/20 0312 09/12/20 1020 09/13/20 0438  BILITOT 0.4 0.3 0.6 0.7  AST 12* 12* 13* 13*  ALT ALKPHOS 68 70 78 70  PROT 6.8 7.0 7.5 7.0  ALBUMIN 3.8 3.7 3.8 3.5    TUMOR MARKERS: No results for input(s): AFPTM, CEA, CA199, CHROMGRNA in the last 8760 hours.  Assessment and Plan:  CVA Dysphagia Aspiration Deconditioning and need for long term care Scheduled for perc G tube in IR tentatively for tomorrow Risks and benefits image guided gastrostomy tube placement was discussed with the  patient including, but not limited to the need for a barium enema during the procedure, bleeding, infection, peritonitis and/or damage to adjacent structures.  All of the patient's questions were answered, patient is agreeable to proceed. Consent signed and in chart.   Thank you  for this interesting consult.  I greatly enjoyed meeting TEJ MURDAUGH and look forward to participating in their care.  A copy of this report was sent to the requesting provider on this date.  Electronically Signed: Robet Leu, PA-C 09/17/2020, 9:26 AM   I spent a total of 40 Minutes    in face to face in clinical consultation, greater than 50% of which was counseling/coordinating care for percutaneous gastric tube placement

## 2020-09-17 NOTE — Progress Notes (Signed)
Physical Therapy Treatment Patient Details Name: Brent [name] MRN: [id number] DOB: Oct 24, 1965 Today's Date: 09/17/2020    History of Present Illness 55 y.o. male presenting to ED with SOB, slurred speech and L sided weakness x2-3 days. Patient admitted with acute respiratory failure with hypoxia secondary to aspiration PNA, COPD exacerbation and sepsis. Imaging (+) R ACA CVA x2 within R cingulate gyrus/callosal body, R parietal lobe subcortical white matter infarct and questionable acute punctate infarct within L frontal lobe subcortical white matter. Transferred to ICU at [facility] 7/31 due to ongoing respiratory failure with complete opacification of left hemithorax   PMHx significant for cocaine and tobacco use disorder, lumbago, HTN, HLD, DMII and Hx of CVA.    PT Comments    Patient just completing chest therapy and suctioning by RT on arrival. Pt's sats hovering 87-88% on FiO2 80% via HHFNC. RT increased to 100% for therapy session. Patient only tolerated LE exercises with sats remaining 88-89% on 100% FiO2. Patient cooperative and very easily fatigued.     Follow Up Recommendations  CIR     Equipment Recommendations  None recommended by PT    Recommendations for Other Services       Precautions / Restrictions Precautions Precautions: Fall    Mobility  Bed Mobility               General bed mobility comments: deferred due to incr O2 demands and lower sats    Transfers                 General transfer comment: deferred due to decr sats on 100% FiO2  Ambulation/Gait                 Stairs             Wheelchair Mobility    Modified Rankin (Stroke Patients Only)       Balance                                            Cognition Arousal/Alertness: Awake/alert Behavior During Therapy: WFL for tasks assessed/performed Overall Cognitive Status: No family/caregiver present to determine baseline cognitive functioning                                  General Comments: Follows 1-step verbal commands with good accuracy; A&Ox3.      Exercises General Exercises - Lower Extremity Ankle Circles/Pumps: PROM (heel cord stretching x 30 sec x 3 reps each; rt tighter than left) Heel Slides: AAROM;Strengthening;Both;10 reps (assisted flexion and resisted extension (rt stronger)) Hip ABduction/ADduction: AAROM;Both;10 reps (rt stronger)    General Comments General comments (skin integrity, edema, etc.): on arrival, pt with sats 87% with RT present. Pt had just completed being suctioned. Slowly increased to 89% and RT adjusted FiO2 to 100% via HHFNC to allow PT to work with pt. Sats 88-89% throughout session.      Pertinent Vitals/Pain Pain Assessment: No/denies pain    Home Living                      Prior Function            PT Goals (current goals can now be found in the care plan section) Acute Rehab PT Goals Patient Stated Goal: return home after rehab  Time For Goal Achievement: 09/25/20 Potential to Achieve Goals: Good Progress towards PT goals: Not progressing toward goals - comment (respiratory status did not allow incr mobility this date)    Frequency    Min 4X/week      PT Plan Current plan remains appropriate    Co-evaluation              AM-PAC PT "6 Clicks" Mobility   Outcome Measure  Help needed turning from your back to your side while in a flat bed without using bedrails?: A Lot Help needed moving from lying on your back to sitting on the side of a flat bed without using bedrails?: Total Help needed moving to and from a bed to a chair (including a wheelchair)?: Total Help needed standing up from a chair using your arms (e.g., wheelchair or bedside chair)?: Total Help needed to walk in hospital room?: Total Help needed climbing 3-5 steps with a railing? : Total 6 Click Score: 7    End of Session Equipment Utilized During Treatment: Oxygen Activity  Tolerance: Treatment limited secondary to medical complications (Comment) (sats high 80s after RT increased to 100% FiO2 for therapy) Patient left: with call bell/phone within reach;in bed;with bed alarm set   PT Visit Diagnosis: Unsteadiness on feet (R26.81);Other abnormalities of gait and mobility (R26.89);Muscle weakness (generalized) (M62.81)     Time: 1194-1740 PT Time Calculation (min) (ACUTE ONLY): 22 min  Charges:  $Therapeutic Exercise: 8-22 mins                      Jerolyn Center, PT Pager [phone]    Zena Amos 09/17/2020, 12:42 PM

## 2020-09-17 NOTE — Progress Notes (Signed)
Pharmacy Antibiotic Note  Brent [name] is a 55 y.o. male admitted on 09/11/2020. Currently on Zosyn for aspiration PNA. Pharmacy has been consulted for Vancomycin dosing for TA growing rare GPC. Resending MRSA PCR.   Plan: Vancomcyin 1750mg  x1 loading dose followed by vancomycin 1250 q12h (eAUC 466 using Scr 0.88) Monitor renal function  Follow up MRSA PCR  Height: 6' (182.9 cm) Weight: 82.1 kg (181 lb) IBW/kg (Calculated) : 77.6  Temp (24hrs), Avg:98.8 F (37.1 C), Min:98.4 F (36.9 C), Max:99.3 F (37.4 C)  Recent Labs  Lab 09/11/20 0855 09/12/20 1020 09/13/20 0438 09/14/20 0330 09/15/20 0449 09/16/20 0427 09/17/20 0504  WBC  --    < > 14.0* 14.2* 14.4* 19.2* 17.2*  CREATININE  --    < > 0.86 0.90 0.86 0.87 0.88  LATICACIDVEN 1.2  --   --   --   --   --   --    < > = values in this interval not displayed.    Estimated Creatinine Clearance: 104.1 mL/min (by C-G formula based on SCr of 0.88 mg/dL).    No Known Allergies  Antimicrobials this admission: Azithromycin 7/27 >> 7/31  Zosyn 7/27 >>  Vancomycin 7/27 >>  Dose adjustments this admission: N/a  Microbiology results: 8/2 TA: rare GPC  Thank you for allowing pharmacy to be a part of this patient's care.  10/2, PharmD, BCPS Clinical Pharmacist  09/17/2020 3:59 PM

## 2020-09-17 NOTE — Progress Notes (Signed)
Palliative:  HPI: 55 y.o. 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   I met today with Brent [name]. He is awake and alert. He is able to verbally respond but very poor recall and understanding of his condition. He even tells me that his PEG has already been placed (it has not). He gives me permission to call his mother and discuss his care.   I called and spoke with mother, Brent [name]. We discussed his worsening respiratory status and increased oxygen needs. We further discussed expectations of poor cough due to weakness from stroke and the likelihood that this will continue to be a barrier for him. I expressed my concern that he is very high risk for intubation and unlikely to be successfully extubated and even if he is successfully extubated I fear this could become a cycle for him leading to ongoing decline and deterioration. Brent [name] voices understanding. Brent [name] expresses desire to continue full code and full scope treatment based on his wishes and history of Brent [name] pulling through these situations. Brent [name] wants for [employer] and all family to have a peace that they have done everything to give Brent [name] every opportunity to improve. He has been intubated and has arrested multiple times in the past and has always "bounced back." Brent [name] shares that she has made her peace and is fully prepared to make the difficult decisions if/when he is intubated and declines further. She will need help explaining this to his daughter and significant other when the time comes. Brent [name] has good understanding of her son's poor prognosis although at this time she still requests full scope to allow more time for any  potential improvement.   All questions/concerns addressed. Emotional support provided. Of note, Brent [name] is the primary caregiver of her elderly mother and not always present at Brent [name]'s bedside but is readily available by phone.   Exam: Alert, confused, very poor memory. No overt distress at this time. Requiring HFNC 40L 70% FiO2. Abd soft.   Plan: - Mother has good understanding of expectations. Still requesting full code, full scope. If [name] is reintubated and continues to fail his mother is prepared to make decision for comfort at that time but they need to feel ensured that they have given him every opportunity and time for any improvement.   80 min  Vinie Sill, NP Palliative Medicine Team Pager [phone] (Please see [email or web address] for schedule) Team Phone [phone]    Greater than 50%  of this time was spent counseling and coordinating care related to the above assessment and plan

## 2020-09-17 NOTE — Progress Notes (Signed)
Inpatient Diabetes Program Recommendations  AACE/ADA: New Consensus Statement on Inpatient Glycemic Control (2015)  Target Ranges:  Prepandial:   less than 140 mg/dL      Peak postprandial:   less than 180 mg/dL (1-2 hours)      Critically ill patients:  140 - 180 mg/dL   Lab Results  Component Value Date   GLUCAP 236 (H) 09/17/2020   HGBA1C 6.4 (H) 09/12/2020    Review of Glycemic Control Results for JP, EASTHAM (MRN [id number]) as of 09/17/2020 11:53  Ref. Range 09/16/2020 19:04 09/16/2020 23:23 09/17/2020 03:38 09/17/2020 07:11 09/17/2020 11:24  Glucose-Capillary Latest Ref Range: 70 - 99 mg/dL 659 (H) 935 (H) 701 (H) 284 (H) 236 (H)   Diabetes history: DM 2 Outpatient Diabetes medications:  Lantus 70 units daily Current orders for Inpatient glycemic control:  Levemir 15 units bid Novolog resistant q 4 hours Solumedrol 40 mg IV q 24 hours Osmolite 65 ml/hr Inpatient Diabetes Program Recommendations:   Please add Novolog tube feed coverage 3 units q 4 hours.    Thanks,  Beryl Meager, RN, BC-ADM Inpatient Diabetes Coordinator Pager [phone]  (8a-5p)

## 2020-09-17 NOTE — Progress Notes (Signed)
SLP Cancellation Note  Patient Details Name: Brent [name] MRN: [id number] DOB: 1965-07-01   Cancelled treatment:       Reason Eval/Treat Not Completed: Medical issues which prohibited therapy- Not managing secretions well, plan is for NT suctioning. High risk for intubation per today's notes.  Plan for PEG tomorrow. SLP will follow for dysphagia treatment when ready.    Andreya Lacks L. Samson Frederic, MA CCC/SLP Acute Rehabilitation Services Office number [phone] Pager [phone]    Blenda Mounts Laurice 09/17/2020, 12:03 PM

## 2020-09-17 NOTE — Progress Notes (Signed)
eLink Physician-Brief Progress Note Patient Name: [name] DOB: [birth year] MRN: [id number]   Date of Service  09/17/2020  HPI/Events of Note  Hypoxia - Increase in O2 from 5 L/min [place] to 100% HHFNC. CXR reveals of L lung collapse with volume loss on L and hyperexpansion on R. Already on hypertonic saline, chest PT and NTS. Felt to have poor cough and likely chronic aspiration. Also felt to require intubation is he required bronchoscopy.   eICU Interventions  Plan: Will request the PCCM ground team evaluate the patient at bedside for possible intubation and bronchoscopy.      Intervention Category Major Interventions: Hypoxemia - evaluation and management  Dutchess Crosland Eugene 09/17/2020, 4:51 AM

## 2020-09-17 NOTE — Significant Event (Signed)
Called to evaluate patient at ~5 am for worsened hypoxemia. Reviewed serial CXRs that shoes persistent near total left lung atelectasis with L sided volume loss. Similar small portion of what is presumed LUL aerated. CXR largely unchanged from prior and slightly better aeration compared to 7/31. On evaluation, RR 18-20 without accessory muscle use. On heated HFNC 100% FiO2, 40 L. L side with diminished  breath sounds. He asks over and over for something cold to drink. Explained he is aspirating (objectively on barium swallow) and likely is why he needs oxygen. Asked him to cough and he essentially gives just a prolonged expiration, no real cough.   Oxygen saturation high 80s to low 90s on HFNC, salter [place], and room air over last couple of days per flowsheet. Occasional higher 90s documented. As would expect but well demonstrated on vitals, he is displaying large shunt physiology with similar O2 sats on wildly different O2 percentage delivered. The major culprit appears to be weakness related to new acute stroke. Possible intermittent ongoing silent aspiration pneumonitis given unchanged CXR. Given mechanical ventilation is unlikely to improve and likely to contribute to worsened weakness and cough mechanics and in setting of shunt physiology, would not pursue intubation unless increased work of breathing or dangerous prolonged oxygen desaturation. Encouraged ongoing efforts at chest physiotherapy, encouraged patient to use flutter valve much more frequently than currently attempting. Plan of care discussed with RT and RN.

## 2020-09-18 DIAGNOSIS — R1310 Dysphagia, unspecified: Secondary | ICD-10-CM | POA: Diagnosis not present

## 2020-09-18 DIAGNOSIS — J441 Chronic obstructive pulmonary disease with (acute) exacerbation: Secondary | ICD-10-CM | POA: Diagnosis not present

## 2020-09-18 DIAGNOSIS — J9601 Acute respiratory failure with hypoxia: Secondary | ICD-10-CM | POA: Diagnosis not present

## 2020-09-18 DIAGNOSIS — J690 Pneumonitis due to inhalation of food and vomit: Secondary | ICD-10-CM | POA: Diagnosis not present

## 2020-09-18 LAB — CBC
HCT: 42.1 % (ref 39.0–52.0)
Hemoglobin: 13.5 g/dL (ref 13.0–17.0)
MCH: 31.4 pg (ref 26.0–34.0)
MCHC: 32.1 g/dL (ref 30.0–36.0)
MCV: 97.9 fL (ref 80.0–100.0)
Platelets: 355 10*3/uL (ref 150–400)
RBC: 4.3 MIL/uL (ref 4.22–5.81)
RDW: 13.3 % (ref 11.5–15.5)
WBC: 13.4 10*3/uL — ABNORMAL HIGH (ref 4.0–10.5)
nRBC: 0 % (ref 0.0–0.2)

## 2020-09-18 LAB — GLUCOSE, CAPILLARY
Glucose-Capillary: 138 mg/dL — ABNORMAL HIGH (ref 70–99)
Glucose-Capillary: 148 mg/dL — ABNORMAL HIGH (ref 70–99)
Glucose-Capillary: 169 mg/dL — ABNORMAL HIGH (ref 70–99)
Glucose-Capillary: 141 mg/dL — ABNORMAL HIGH (ref 70–99)
Glucose-Capillary: 256 mg/dL — ABNORMAL HIGH (ref 70–99)
Glucose-Capillary: 201 mg/dL — ABNORMAL HIGH (ref 70–99)

## 2020-09-18 LAB — BASIC METABOLIC PANEL
Anion gap: 13 (ref 5–15)
BUN: 21 mg/dL — ABNORMAL HIGH (ref 6–20)
CO2: 25 mmol/L (ref 22–32)
Calcium: 9.7 mg/dL (ref 8.9–10.3)
Chloride: 110 mmol/L (ref 98–111)
Creatinine, Ser: 0.85 mg/dL (ref 0.61–1.24)
GFR, Estimated: >60 mL/min (ref >60)
Glucose, Bld: 126 mg/dL — ABNORMAL HIGH (ref 70–99)
Potassium: 3.8 mmol/L (ref 3.5–5.1)
Sodium: 148 mmol/L — ABNORMAL HIGH (ref 135–145)

## 2020-09-18 LAB — PHOSPHORUS: Phosphorus: 3.3 mg/dL (ref 2.5–4.6)

## 2020-09-18 LAB — MAGNESIUM: Magnesium: 1.6 mg/dL — ABNORMAL LOW (ref 1.7–2.4)

## 2020-09-18 MED ORDER — PREDNISONE 20 MG PO TABS
30.0000 mg | ORAL_TABLET | Freq: Every day | ORAL | Status: AC
  Administered 2020-09-19 – 2020-09-23 (×5): 30 mg
  Filled 2020-09-18 (×5): qty 1

## 2020-09-18 MED ORDER — ENOXAPARIN SODIUM 40 MG/0.4ML IJ SOSY: 40.0000 mg | PREFILLED_SYRINGE | INTRAMUSCULAR | Status: DC

## 2020-09-18 MED ORDER — MAGNESIUM SULFATE 4 GM/100ML IV SOLN
4.0000 g | Freq: Once | INTRAVENOUS | Status: AC
  Administered 2020-09-18: 4 g via INTRAVENOUS
  Filled 2020-09-18: qty 100

## 2020-09-18 MED ORDER — PREDNISONE 5 MG/5ML PO SOLN
30.0000 mg | Freq: Every day | ORAL | Status: DC
  Filled 2020-09-18 (×2): qty 30

## 2020-09-18 NOTE — Progress Notes (Signed)
RT administered chest pt with chest vest and scheduled nebulizer tx.. Patient does not require NTS at this time.

## 2020-09-18 NOTE — TOC CAGE-AID Note (Signed)
Transition of Care [facility]) - CAGE-AID Screening   Patient Details  Name: Brent [name] MRN: [id number] Date of Birth: 02-Jun-1965  Transition of Care [facility]) CM/SW Contact:    Lossie Faes Tarpley-Carter, LCSWA Phone Number: 09/18/2020, 2:21 PM   Clinical Narrative: Pt participated in Cage-Aid.  Pt denied use of substance and/or ETOH.  Pt was offered resources.  Pt stated they did not feel that they were in need of resources at this time.   Alanzo Lamb Tarpley-Carter, MSW, LCSW-A Pronouns:  She/Her/Hers [facility] HealthTransitions of Care Clinical Social Worker Direct Number:  [phone] [email or web address]   CAGE-AID Screening:    Have You Ever Felt You Ought to Cut Down on Your Drinking or Drug Use?: No Have People Annoyed You By [employer] Your Drinking Or Drug Use?: No Have You Felt Bad Or Guilty About Your Drinking Or Drug Use?: No Have You Ever Had a Drink or Used Drugs First Thing In The Morning to Steady Your Nerves or to Get Rid of a Hangover?: No CAGE-AID Score: 0  Substance Abuse Education Offered: Yes

## 2020-09-18 NOTE — Progress Notes (Signed)
Occupational Therapy Co-Treatment Patient Details Name: Brent [name] MRN: [id number] DOB: 10/24/1965 Today's Date: 09/18/2020    History of present illness 55 y.o. male presenting to ED with SOB, slurred speech and L sided weakness x2-3 days. Patient admitted with acute respiratory failure with hypoxia secondary to aspiration PNA, COPD exacerbation and sepsis. Imaging (+) R ACA CVA x2 within R cingulate gyrus/callosal body, R parietal lobe subcortical white matter infarct and questionable acute punctate infarct within L frontal lobe subcortical white matter. Transferred to ICU at [facility] 7/31 due to ongoing respiratory failure with complete opacification of left hemithorax.   PMHx significant for cocaine and tobacco use disorder, lumbago, HTN, HLD, DMII and Hx of CVA.   OT comments  PT/OT co-treatment with focus on NMR, static sitting/standing balance, functional transfers with use of Stedy and activity tolerance. Patient making slow progress toward goals limited by decreased activity tolerance and increased need for supplemental O2 requiring 40L with FiO2 80% on HHFNC. Patient desat to 83% with bed mobility and functional transfers. Would benefit from continued acute OT service in pre for d/c to next level of care with continued recommendation for CIR.    Follow Up Recommendations  CIR    Equipment Recommendations  Other (comment)    Recommendations for Other Services      Precautions / Restrictions Precautions Precautions: Fall Precaution Comments: Heated HFNC 40L 80% FiO2 Restrictions Weight Bearing Restrictions: No       Mobility Bed Mobility Overal bed mobility: Needs Assistance Bed Mobility: Supine to Sit Rolling: Mod assist;+2 for physical assistance   Supine to sit: +2 for physical assistance;Mod assist;HOB elevated     General bed mobility comments: Pt able to come to EOB with assist for Left LE as well as assist at trunk for elevation with pt using rightUE to pull on  therapist hand.  Needed assist to scoot out with pad.  Desat initially at EOB and took a few min to recover to >88% on heated HFNC.    Transfers Overall transfer level: Needs assistance   Transfers: Sit to/from Stand;Stand Pivot Transfers Sit to Stand: Mod assist;+2 physical assistance;+2 safety/equipment;Max assist;From elevated surface Stand pivot transfers: Total assist       General transfer comment: Pt was able to stand to [facility] with first attempt max assist with pt not able to completely stand. Raised bed and then pt stood 2 more x with mod assist of 2 with asssist at buttocks to extend as well as at trunk forclose to full upright trunk with tactile support at clavicles.  Pt able to stand for about 10 seconds at a time.  Again, desaturates with activity and needs rest breaks, cues for pursed lip breathing as well as pt did cough up phlegm a few times needing to use suction.  Pt was moved to the recliner in [place] and then stood last time to sit in chair.  Pt was fatigued. Notified nurse that sats at end of treatment were hovering between 88-90% and nurse to monitor pt.    Balance Overall balance assessment: Needs assistance Sitting-balance support: Feet supported;Single extremity supported Sitting balance-Leahy Scale: Poor Sitting balance - Comments: Reliant on external assist to maintain static sitting balance at EOB; LOB in all directions with tendency for left lateral lean Postural control: Left lateral lean Standing balance support: Bilateral upper extremity supported;During functional activity Standing balance-Leahy Scale: Poor Standing balance comment: Reliant on external assist +2 as well as UE support and use of STedy with poor upright  postural stability                           ADL either performed or assessed with clinical judgement   ADL                       Lower Body Dressing: Total assistance;Bed level Lower Body Dressing Details (indicate cue  type and reason): To don footwear               General ADL Comments: Patient greatly limited by L hemiplegia, visual deficits, increased need for supplemental O2 via HHFNC, decreased static sitting balance and need for +2 assist for all bed mobility and functional transfers grossly.     Vision       Perception     Praxis      Cognition Arousal/Alertness: Awake/alert Behavior During Therapy: WFL for tasks assessed/performed Overall Cognitive Status: No family/caregiver present to determine baseline cognitive functioning                                 General Comments: Follows 1-step verbal commands with good accuracy; A&Ox3.        Exercises Exercises: General Upper Extremity General Exercises - Upper Extremity Shoulder Flexion: AROM;Right;10 reps General Exercises - Lower Extremity Ankle Circles/Pumps: AROM;Right;5 reps;Supine Heel Slides: AAROM;Strengthening;Both;10 reps (assisted flexion and resisted extension (rt stronger))   Shoulder Instructions       General Comments 93% Sats on arrival with pt on Heated HFNC at 80% FiO2.  Sats dropped as low as 83% during treatment.  At departure sats 88-90%.  Other VSS    Pertinent Vitals/ Pain       Pain Assessment: No/denies pain  Home Living                                          Prior Functioning/Environment              Frequency  Min 2X/week        Progress Toward Goals  OT Goals(current goals can now be found in the care plan section)  Progress towards OT goals: Progressing toward goals  Acute Rehab OT Goals Patient Stated Goal: return home after rehab OT Goal Formulation: With patient Time For Goal Achievement: 09/26/20 Potential to Achieve Goals: Fair ADL Goals Pt Will Perform Grooming: with min assist;with min guard assist;sitting;with adaptive equipment Pt Will Perform Upper Body Dressing: with min assist;with min guard assist;sitting;bed level Pt Will  Perform Lower Body Dressing: with mod assist;with max assist;sitting/lateral leans;bed level;with adaptive equipment Pt Will Transfer to Toilet: with mod assist;stand pivot transfer;bedside commode Pt/caregiver will Perform Home Exercise Program: Increased ROM;Left upper extremity;With minimal assist;With Supervision  Plan Discharge plan remains appropriate;Frequency remains appropriate    Co-evaluation      Reason for Co-Treatment: Complexity of the patient's impairments (multi-system involvement) PT goals addressed during session: Mobility/safety with mobility OT goals addressed during session: ADL's and self-care      AM-PAC OT "6 Clicks" Daily Activity     Outcome Measure   Help from another person eating meals?: Total (cortrak) Help from another person taking care of personal grooming?: A Lot Help from another person toileting, which includes using toliet, bedpan, or urinal?: Total Help from another person bathing (including  washing, rinsing, drying)?: A Lot Help from another person to put on and taking off regular upper body clothing?: A Lot Help from another person to put on and taking off regular lower body clothing?: Total 6 Click Score: 9    End of Session Equipment Utilized During Treatment: Gait belt;Other (comment) Antony Salmon)  OT Visit Diagnosis: Unsteadiness on feet (R26.81);Muscle weakness (generalized) (M62.81);Other symptoms and signs involving the nervous system (R29.898);Hemiplegia and hemiparesis Hemiplegia - Right/Left: Left Hemiplegia - dominant/non-dominant: Non-Dominant Hemiplegia - caused by: Cerebral infarction   Activity Tolerance Patient tolerated treatment well;Patient limited by fatigue   Patient Left in chair;with call bell/phone within reach;with chair alarm set   Nurse Communication Mobility status;Other (comment) (Response to treatment and desats)        Time: 0932-1000 OT Time Calculation (min): 28 min  Charges: OT General Charges $OT  Visit: 1 Visit OT Treatments $Neuromuscular Re-education: 8-22 mins  Gianelle Mccaul H. OTR/L Supplemental OT, Department of rehab services [phone]   Koda Defrank R H. 09/18/2020, 10:41 AM

## 2020-09-18 NOTE — Progress Notes (Signed)
Pharmacy Electrolyte Replacement  Recent Labs:  Recent Labs    09/18/20 0404  K 3.8  MG 1.6*  PHOS 3.3  CREATININE 0.85   No Critical values noted.   Plan: Supplement Mg with 4g Magnesium sulfate per protocol   Jani Gravel, PharmD PGY-1 Acute Care Resident  09/18/2020 7:45 AM

## 2020-09-18 NOTE — Progress Notes (Signed)
NAME:  Brent [name], MRN:  [id number], DOB:  04-28-1965, LOS: 7 ADMISSION DATE:  09/11/2020, CONSULTATION DATE:  7/31 REFERRING MD:  Tat, CHIEF COMPLAINT:  respiratory failure    History of Present Illness:  55 year old male patient with history as mentioned below presented from home with chief complaint of shortness of breath and new onset of worsening left-sided weakness and facial droop .  Telemetry neurology consult was obtained.  CT head and neck completed there was no acute hemorrhage or core infarct he was admitted to the inpatient service with working diagnosis of acute respiratory failure secondary to aspiration pneumonia, sepsis, and new stroke he last smoked cocaine on 7/25. Hospital course Was treated at [facility] for the above, from 7/25 through 7/31 Reason for medical records he had continued episodes of desaturation, a barium swallow was completed on 7/28 which showed moderate pharyngeal phase dysphagia and witnessed aspiration.  He was seen on 7/29 by palliative care medicine for goals of care at this point verified full code, with plan that he would likely need a G-tube for alternative nutrition.  On 7/29 he began to require increased supplemental oxygen, with titration up to as high as 15 L high flow serial chest x-ray imaging on day of admission showed fairly clear chest x-ray however subsequently scale imaging on CT scan 7/28 new left lower lobe consolidation, and subsequent plain imaging on July 30 showed progressive and marked left-sided atelectasis and then on the 31st complete opacification of left hemithorax.  Because of her worsening chest x-ray, and increased oxygen needs he was transferred to [facility] for possible bronchoscopy  Pertinent  Medical History  Hypertension, hyperlipidemia, diabetes type 2, tobacco abuse.  Stroke .  With left hemiparesis.  Cocaine abuse.  OSA. GERD.  Prior seizure disorder.  Prior tracheostomy (remote), and not able to tolerate  CPAP. Significant Hospital Events: Including procedures, antibiotic start and stop dates in addition to other pertinent events   7/25 admitted with aspiration pneumonia and new stroke with worsening left-sided left hemiparesis and facial droop as well as slurred speech.  This was over a 2 to 3-day period of time, in regards to the weakness so was not a tPA or neuro IR candidate. He was admitted therapeutic interventions included antiplatelet therapy, for stroke.  He was placed on azithromycin and Zosyn. 7/28 increasing oxygen needs.  Barium swallow demonstrating aspiration.  Interventional radiology consulted for possible PEG. 7/29 ongoing increased oxygen needs.  Goals of care conversation with palliative care.  Patient wanting full code okay with PEG tube if needed 7/30 through 7/31: Increasing oxygen requirements, worsening left-sided atelectasis to the point of complete opacification of the left hemithorax on time of transfer to [facility] on 7/31.  Azithromycin discontinued.  Interim History / Subjective:  Refused NTS overnight. Variable oxygen requirements but no significant desaturation episodes. Afebrile overnight.  Objective   Blood pressure (!) 144/92, pulse 76, temperature 98.8 F (37.1 C), temperature source Oral, resp. rate 18, height 6' (1.829 m), weight 82.8 kg, SpO2 (!) 89 %.    FiO2 (%):  [70 %-100 %] 80 %   Intake/Output Summary (Last 24 hours) at 09/18/2020 0808 Last data filed at 09/18/2020 0800 Gross per 24 hour  Intake 1703.37 ml  Output 876 ml  Net 827.37 ml    Filed Weights   09/15/20 0500 09/17/20 0500 09/18/20 0440  Weight: 89.5 kg 82.1 kg 82.8 kg    Examination: General: ill-appearing man lying in bed in NAD  HENT: [place]/AT, eyes anicteric, oral mucosa moist. Neck: prior trach scar healed Lungs: Left breath sounds reduced compared to R. Strong induced cough with NTS with thick yellow secretions. No tachypnea or accessory muscle use. Cardiovascular: S1S2,  RRR Abdomen: soft, NT, ND Extremities: no peripheral edema,  no cyanosis. Neuro: awake, alert, moving R side, hemiparetic on left Derm: warm, dry, no rashes   WBC 13.4 BUN 21 Cr 0.85 Trach aspirate  8/2: GPC MRSA nares 8/2 negative  Resolved Hospital Problem list    Sepsis  Assessment & Plan:  Acute hypoxic respiratory failure secondary to aspiration pneumonia/mucous plugging with complete collapse of the left lung. Has demonstrated significant impulsivity and noncompliance with dietary restrictions--track placement today.  He is to have very poor cough mechanics. Dysphagia with aspiration identified on modified barium swallow 7/28 Plan - Needs ongoing ICU monitoring for aggressive respiratory support. Frequent CPT, NTS, nebs.  Remains high risk for intubation. Intubation seems risky given that he will be hard to get off the vent with such poor respiratory mechanics. -adding mucinex BID -Continue hypertonic saline, CPT, NTS.  Consented to NTS when we discussed how important it is to avoid intubation being required. -Continue Zosyn; seems to be needing a longer course, likely due to poor clearance of secretions. Can stop vanc given repeat negative MRSA nares. -Con't supplemental O2, wean as tolerated. Has shunt physiology. -repeat CXR tomorrow AM  History of tobacco abuse, with COPD exacerbation. Previously is followed by Dr. Maple Hudson  -Continue bronchodilators - Steroids tapering- switch to prednisone today.   History of obstructive sleep apnea- AHI 7.8.  Not on CPAP PTA. -Nocturnal supplemental oxygen  Acute ischemic stroke involving the right posterior temporal occipital lobe.  Exacerbated by cocaine abuse. 4 previous strokes. -Continue neuro monitoring -Seen by neurology admission, eventually will resume dual antiplatelet therapy with aspirin and Plavix, these are currently on hold pending PEG tube placement today.  -Will likely need CIR when more stable from pulmonary  standpoint  History of chronic back pain and polysubstance abuse -Continuing gabapentin when more stable from a pulmonary standpoint. -As needed Tylenol. Can add heat compresses or topical lidocaine if needed.  History of hypertension -Okay to continue mild permissive hypertension. Bps in a tolerable range.  Can resume metoprolol and benazepril slowly and monitor for worsening neuro symptoms.  Hyperlipidemia -Cont' PTA Crestor and fenofibrate.  Diabetes type 2 with hyperglycemia- controlled -Continue sliding scale insulin as needed - Goal BG 140-180. - holding levemir while off TF today  H/o seizure d/o -con't PTA tegretol  Tobacco abuse -nicotine replacement therapy  -recommend cessation   Best Practice (right click and "Reselect all SmartList Selections" daily)   Diet/type: NPO DVT prophylaxis: LMWH GI prophylaxis: N/A Lines: N/A Foley:  N/A Code Status:  full code Last date of multidisciplinary goals of care discussion [7/28]  Labs   CBC: Recent Labs  Lab 09/14/20 0330 09/15/20 0449 09/16/20 0427 09/17/20 0504 09/18/20 0404  WBC 14.2* 14.4* 19.2* 17.2* 13.4*  HGB 13.7 14.3 14.5 14.0 13.5  HCT 41.8 44.0 44.5 43.6 42.1  MCV 98.1 98.4 96.1 96.9 97.9  PLT 325 315 415* 375 355     Basic Metabolic Panel: Recent Labs  Lab 09/14/20 0330 09/14/20 0330 09/15/20 0449 09/16/20 0427 09/16/20 1632 09/17/20 0504 09/17/20 1652 09/18/20 0404  NA 142  --  145 145  --  144  --  148*  K 3.9  --  3.9 3.7  --  3.9  --  3.8  CL  104  --  107 109  --  107  --  110  CO2 28  --  28 27  --  25  --  25  GLUCOSE 142*  --  148* 161*  --  226*  --  126*  BUN 25*  --  29* 24*  --  26*  --  21*  CREATININE 0.90  --  0.86 0.87  --  0.88  --  0.85  CALCIUM 9.2  --  9.4 9.6  --  9.7  --  9.7  MG 2.0  --  1.8 1.6* 2.3 1.9 1.7 1.6*  PHOS  --    < > 2.7 2.4* 2.1* 2.6 2.2* 3.3   < > = values in this interval not displayed.    GFR: Estimated Creatinine Clearance: 107.8 mL/min  (by C-G formula based on SCr of 0.85 mg/dL). Recent Labs  Lab 09/11/20 0855 09/12/20 1020 09/13/20 0438 09/15/20 0449 09/16/20 0427 09/17/20 0504 09/18/20 0404  PROCALCITON  --  <0.10  --   --   --   --   --   WBC  --  18.7*   < > 14.4* 19.2* 17.2* 13.4*  LATICACIDVEN 1.2  --   --   --   --   --   --    < > = values in this interval not displayed.     Liver Function Tests: Recent Labs  Lab 09/12/20 1020 09/13/20 0438  AST 13* 13*  ALT 12 12  ALKPHOS 78 70  BILITOT 0.6 0.7  PROT 7.5 7.0  ALBUMIN 3.8 3.5    No results for input(s): LIPASE, AMYLASE in the last 168 hours. No results for input(s): AMMONIA in the last 168 hours.  ABG    Component Value Date/Time   PHART 7.432 09/14/2020 1812   PCO2ART 44.0 09/14/2020 1812   PO2ART 43.4 (L) 09/14/2020 1812   HCO3 27.8 09/14/2020 1812   O2SAT 79.7 09/14/2020 1812      Coagulation Profile: No results for input(s): INR, PROTIME in the last 168 hours.   Cardiac Enzymes: No results for input(s): CKTOTAL, CKMB, CKMBINDEX, TROPONINI in the last 168 hours.  HbA1C: Hgb A1c MFr Bld  Date/Time Value Ref Range Status  09/12/2020 04:10 AM 6.4 (H) 4.8 - 5.6 % Final    Comment:    (NOTE)         Prediabetes: 5.7 - 6.4         Diabetes: >6.4         Glycemic control for adults with diabetes: <7.0   07/26/2020 05:59 AM 7.0 (H) 4.8 - 5.6 % Final    Comment:    (NOTE)         Prediabetes: 5.7 - 6.4         Diabetes: >6.4         Glycemic control for adults with diabetes: <7.0     CBG: Recent Labs  Lab 09/17/20 1523 09/17/20 2000 09/17/20 2306 09/18/20 0346 09/18/20 0750  GLUCAP 259* 148* 149* 138* 148*    This patient is critically ill with multiple organ system failure which requires frequent high complexity decision making, assessment, support, evaluation, and titration of therapies. This was completed through the application of advanced monitoring technologies and extensive interpretation of multiple  databases. During this encounter critical care time was devoted to patient care services described in this note for 38 minutes.  Steffanie Dunn, DO 09/18/20 8:23 AM [facility]

## 2020-09-18 NOTE — Progress Notes (Addendum)
IR was requested for G tube placement.   The procedure was tentatively scheduled for today, however, IR was not able to accommodate the procedure today due to scheduled outpatient procedures/urgent cases.   MD notified, diet nursing order placed to stop tube feeding at [facility].   The procedure is tentatively scheduled for tomorrow pending IR schedule.  Please call IR for questions and concerns.    Lynann Bologna Devoiry Corriher PA-C 09/18/2020 3:53 PM

## 2020-09-18 NOTE — Progress Notes (Signed)
Physical Therapy Treatment Patient Details Name: Brent [name] MRN: [id number] DOB: [birth year] Today's Date: 09/18/2020    History of Present Illness 55 y.o. male presenting to ED with SOB, slurred speech and L sided weakness x2-3 days. Patient admitted with acute respiratory failure with hypoxia secondary to aspiration PNA, COPD exacerbation and sepsis. Imaging (+) R ACA CVA x2 within R cingulate gyrus/callosal body, R parietal lobe subcortical Anouk Critzer matter infarct and questionable acute punctate infarct within L frontal lobe subcortical Courtney Bellizzi matter. Transferred to ICU at [facility] 7/31 due to ongoing respiratory failure with complete opacification of left hemithorax.   PMHx significant for cocaine and tobacco use disorder, lumbago, HTN, HLD, DMII and Hx of CVA.    PT Comments    Pt admitted with above diagnosis. Pt was able to tolerate coming to EOB with mod assist of 2 persons. Desaturation to 83-85% at times during session but with pursed lip breathing and rest breaks recovered between bouts of activity.  Use of Stedy to move pt to chair. Pt very participatory and tries hard with what is asked of him. Pt is impulsive at times and has poor postural stability.  AGree with Rehab and feel that pt is excellent candidate.  Pt is still on heated HFNC at 80% FiO2.   Pt currently with functional limitations due to balance and endurance deficits. Pt will benefit from skilled PT to increase their independence and safety with mobility to allow discharge to the venue listed below.      Follow Up Recommendations  CIR     Equipment Recommendations  None recommended by PT    Recommendations for Other Services       Precautions / Restrictions Precautions Precautions: Fall Precaution Comments: Heated HFNC 80% FiO2 Restrictions Weight Bearing Restrictions: No    Mobility  Bed Mobility Overal bed mobility: Needs Assistance Bed Mobility: Supine to Sit Rolling: Mod assist;+2 for physical assistance    Supine to sit: +2 for physical assistance;Mod assist;HOB elevated     General bed mobility comments: Pt able to come to EOB with assist for Left LE as well as assist at trunk for elevation with pt using rightUE to pull on therapist hand.  Needed assist to scoot out with pad.  Desat initially at EOB and took a few min to recover to >88% on heated HFNC.    Transfers Overall transfer level: Needs assistance   Transfers: Sit to/from Stand;Stand Pivot Transfers Sit to Stand: Mod assist;+2 physical assistance;+2 safety/equipment;Max assist;From elevated surface Stand pivot transfers: Total assist       General transfer comment: Pt was able to stand to [facility] with first attempt max assist with pt not able to completely stand. Raised bed and then pt stood 2 more x with mod assist of 2 with asssist at buttocks to extend as well as at trunk forclose to full upright trunk with tactile support at clavicles.  Pt able to stand for about 10 seconds at a time.  Again, desaturates with activity and needs rest breaks, cues for pursed lip breathing as well as pt did cough up phlegm a few times needing to use suction.  Pt was moved to the recliner in [place] and then stood last time to sit in chair.  Pt was fatigued. Notified nurse that sats at end of treatment were hovering between 88-90% and nurse to monitor pt.  Ambulation/Gait             General Gait Details: unable   Stairs  Wheelchair Mobility    Modified Rankin (Stroke Patients Only) Modified Rankin (Stroke Patients Only) Pre-Morbid Rankin Score: Moderate disability Modified Rankin: Severe disability     Balance Overall balance assessment: Needs assistance Sitting-balance support: Feet supported;Single extremity supported Sitting balance-Leahy Scale: Poor Sitting balance - Comments: Reliant on external assist to maintain static sitting balance at EOB; LOB in all directions with tendency for left lateral lean Postural  control: Left lateral lean Standing balance support: Bilateral upper extremity supported;During functional activity Standing balance-Leahy Scale: Poor Standing balance comment: Reliant on external assist +2 as well as UE support and use of STedy with poor upright postural stability                            Cognition Arousal/Alertness: Awake/alert Behavior During Therapy: WFL for tasks assessed/performed Overall Cognitive Status: No family/caregiver present to determine baseline cognitive functioning                                 General Comments: Follows 1-step verbal commands with good accuracy; A&Ox3.      Exercises General Exercises - Lower Extremity Ankle Circles/Pumps: AROM;Right;5 reps;Supine Heel Slides: AAROM;Strengthening;Both;10 reps (assisted flexion and resisted extension (rt stronger))    General Comments General comments (skin integrity, edema, etc.): 93% Sats on arrival with pt on Heated HFNC at 80% FiO2.  Sats dropped as low as 83% during treatment.  At departure sats 88-90%.  Other VSS      Pertinent Vitals/Pain Pain Assessment: No/denies pain    Home Living                      Prior Function            PT Goals (current goals can now be found in the care plan section) Acute Rehab PT Goals Patient Stated Goal: return home after rehab Progress towards PT goals: Progressing toward goals    Frequency    Min 4X/week      PT Plan Current plan remains appropriate    Co-evaluation PT/OT/SLP Co-Evaluation/Treatment: Yes Reason for Co-Treatment: Complexity of the patient's impairments (multi-system involvement);For patient/therapist safety PT goals addressed during session: Mobility/safety with mobility        AM-PAC PT "6 Clicks" Mobility   Outcome Measure  Help needed turning from your back to your side while in a flat bed without using bedrails?: A Lot Help needed moving from lying on your back to sitting on  the side of a flat bed without using bedrails?: Total Help needed moving to and from a bed to a chair (including a wheelchair)?: Total Help needed standing up from a chair using your arms (e.g., wheelchair or bedside chair)?: Total Help needed to walk in hospital room?: Total Help needed climbing 3-5 steps with a railing? : Total 6 Click Score: 7    End of Session Equipment Utilized During Treatment: Oxygen;Gait belt Activity Tolerance: Patient limited by fatigue Patient left: in chair;with call bell/phone within reach;with chair alarm set Nurse Communication: Mobility status;Need for lift equipment (use of Stedy to get back to bed, made aware that pt desaturation) PT Visit Diagnosis: Unsteadiness on feet (R26.81);Other abnormalities of gait and mobility (R26.89);Muscle weakness (generalized) (M62.81)     Time: 0623-7628 PT Time Calculation (min) (ACUTE ONLY): 29 min  Charges:  $Therapeutic Activity: 8-22 mins  [facility] M,PT Acute Rehab Services [phone] (pager)    Bevelyn Buckles 09/18/2020, 10:27 AM

## 2020-09-18 NOTE — Progress Notes (Signed)
Inpatient Rehabilitation Admissions Coordinator     I continue to follow his progress from a distance . Full rehab assessment to follow once medically stabilized and able to participate with therapies.  Ottie Glazier, RN, MSN Rehab Admissions Coordinator [phone] 09/18/2020 8:53 AM

## 2020-09-18 NOTE — Progress Notes (Signed)
Gave patient neb tx his sat was 80-83%.  I increased his FI02 to 80 and 40 followed by nasotracheal suctioning.  Retrieved a large amount of white sputum from patients airway. Patient currently sating 94%.  I will continue to monitor and assess patient for NTS as needed.

## 2020-09-19 ENCOUNTER — Inpatient Hospital Stay (HOSPITAL_COMMUNITY): Payer: Medicaid Other

## 2020-09-19 DIAGNOSIS — I639 Cerebral infarction, unspecified: Secondary | ICD-10-CM | POA: Diagnosis not present

## 2020-09-19 DIAGNOSIS — J441 Chronic obstructive pulmonary disease with (acute) exacerbation: Secondary | ICD-10-CM | POA: Diagnosis not present

## 2020-09-19 DIAGNOSIS — J9601 Acute respiratory failure with hypoxia: Secondary | ICD-10-CM | POA: Diagnosis not present

## 2020-09-19 DIAGNOSIS — R471 Dysarthria and anarthria: Secondary | ICD-10-CM

## 2020-09-19 DIAGNOSIS — T17500A Unspecified foreign body in bronchus causing asphyxiation, initial encounter: Secondary | ICD-10-CM | POA: Diagnosis not present

## 2020-09-19 LAB — BASIC METABOLIC PANEL
Anion gap: 9 (ref 5–15)
BUN: 21 mg/dL — ABNORMAL HIGH (ref 6–20)
CO2: 27 mmol/L (ref 22–32)
Calcium: 9.4 mg/dL (ref 8.9–10.3)
Chloride: 107 mmol/L (ref 98–111)
Creatinine, Ser: 0.88 mg/dL (ref 0.61–1.24)
GFR, Estimated: >60 mL/min (ref >60)
Glucose, Bld: 197 mg/dL — ABNORMAL HIGH (ref 70–99)
Potassium: 3.7 mmol/L (ref 3.5–5.1)
Sodium: 143 mmol/L (ref 135–145)

## 2020-09-19 LAB — CBC
HCT: 41.1 % (ref 39.0–52.0)
Hemoglobin: 13.7 g/dL (ref 13.0–17.0)
MCH: 32 pg (ref 26.0–34.0)
MCHC: 33.3 g/dL (ref 30.0–36.0)
MCV: 96 fL (ref 80.0–100.0)
Platelets: 343 10*3/uL (ref 150–400)
RBC: 4.28 MIL/uL (ref 4.22–5.81)
RDW: 13.1 % (ref 11.5–15.5)
WBC: 10.8 10*3/uL — ABNORMAL HIGH (ref 4.0–10.5)
nRBC: 0 % (ref 0.0–0.2)

## 2020-09-19 LAB — CULTURE, RESPIRATORY W GRAM STAIN
Culture: NORMAL
Gram Stain: NONE SEEN

## 2020-09-19 LAB — GLUCOSE, CAPILLARY
Glucose-Capillary: 128 mg/dL — ABNORMAL HIGH (ref 70–99)
Glucose-Capillary: 116 mg/dL — ABNORMAL HIGH (ref 70–99)
Glucose-Capillary: 197 mg/dL — ABNORMAL HIGH (ref 70–99)
Glucose-Capillary: 254 mg/dL — ABNORMAL HIGH (ref 70–99)
Glucose-Capillary: 227 mg/dL — ABNORMAL HIGH (ref 70–99)
Glucose-Capillary: 95 mg/dL (ref 70–99)

## 2020-09-19 IMAGING — DX DG CHEST 1V PORT
1 series · 1 of 1 positions shown · non-contrast
Comparison: [DATE]. Multiple previous as distant as [DATE].

CLINICAL DATA: Follow-up mucous plug

EXAM:
PORTABLE CHEST 1 VIEW

[chest]
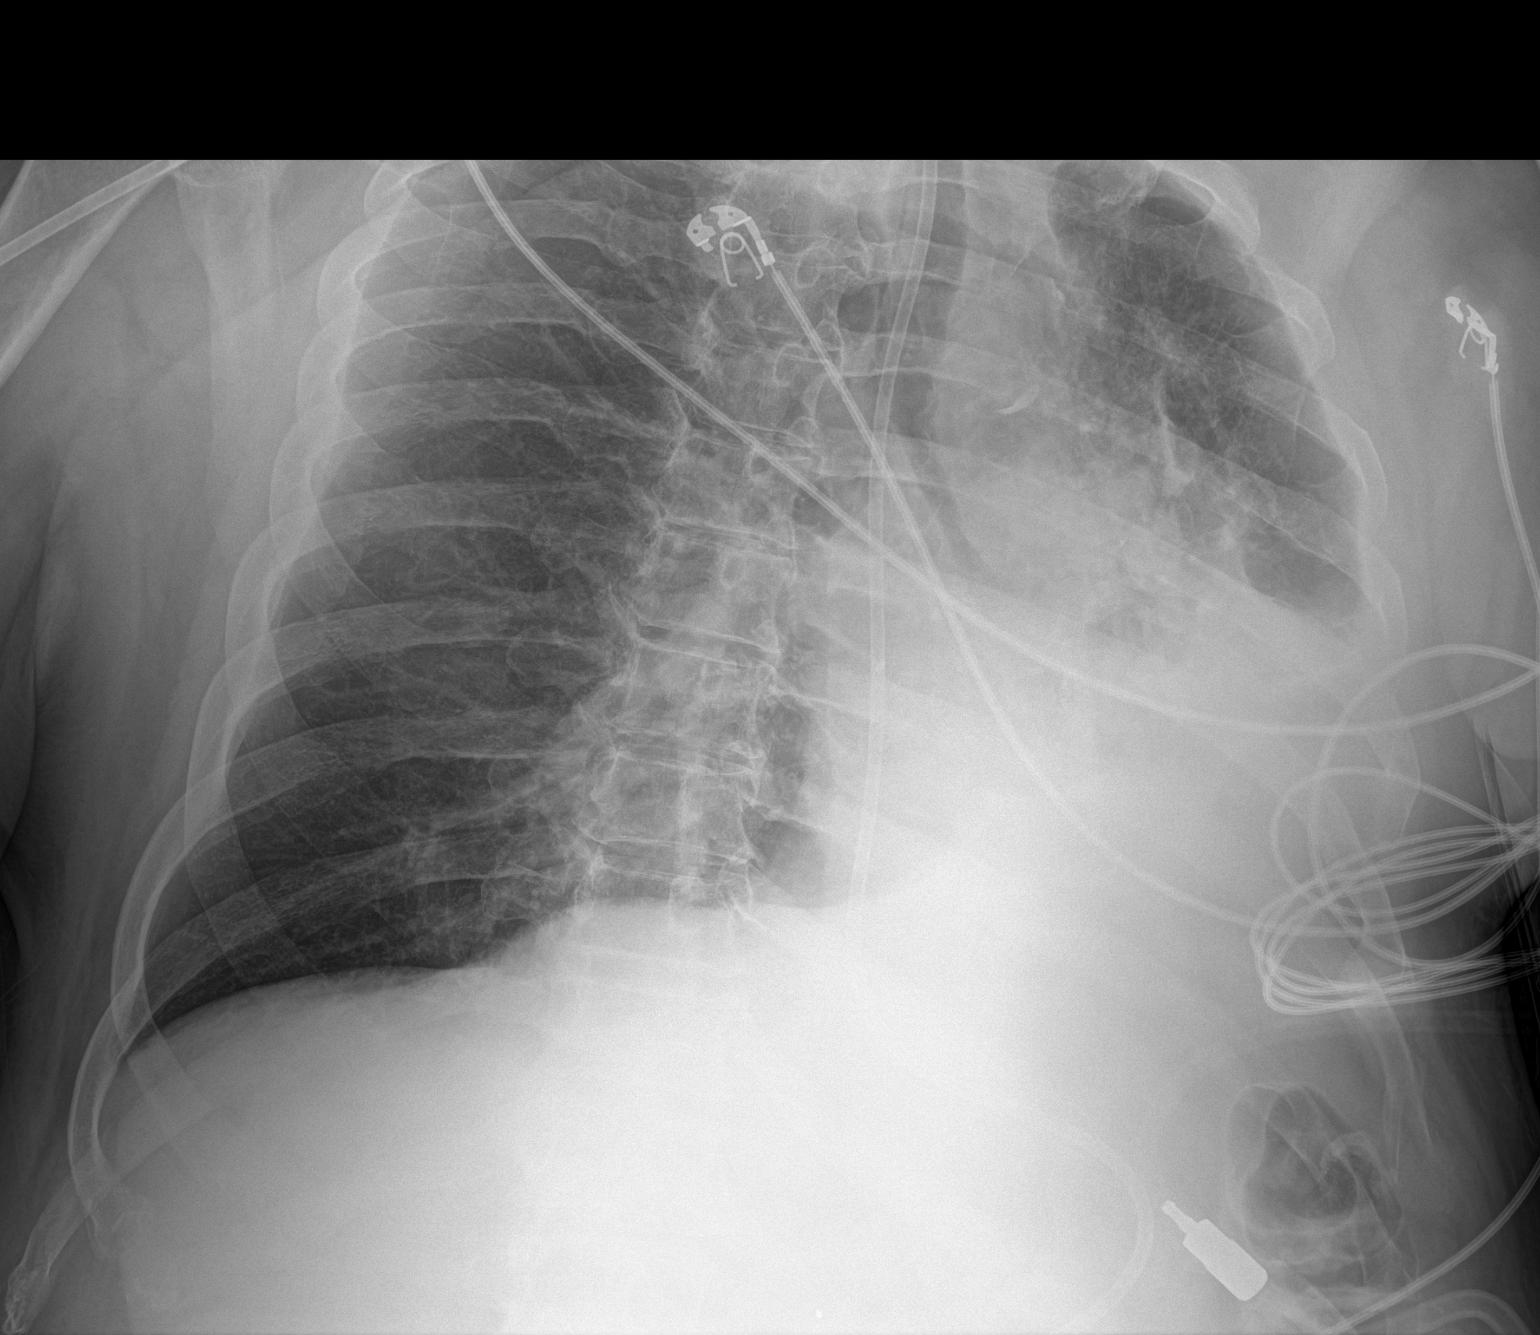

[1 of 1 positions shown; findings below may reference images not displayed]

FINDINGS: The right lung remains clear, hyperinflated. Slight improved
aeration of the left upper lobe. Continued complete collapse of the
left lower lobe. Small amount of pleural fluid on the left as seen
previously. Soft feeding tube enters the abdomen.
IMPRESSION: Slight improved aeration of the left upper lobe. Continued complete
collapse of the left lower lobe.

## 2020-09-19 MED ORDER — CLOPIDOGREL BISULFATE 75 MG PO TABS
75.0000 mg | ORAL_TABLET | Freq: Every day | ORAL | Status: DC
  Administered 2020-09-19: 75 mg
  Filled 2020-09-19 (×2): qty 1

## 2020-09-19 MED ORDER — ASPIRIN 81 MG PO CHEW
81.0000 mg | CHEWABLE_TABLET | Freq: Every day | ORAL | Status: DC
  Administered 2020-09-19 – 2020-09-27 (×9): 81 mg
  Filled 2020-09-19 (×9): qty 1

## 2020-09-19 MED ORDER — POTASSIUM CHLORIDE 20 MEQ PO PACK
40.0000 meq | PACK | Freq: Once | ORAL | Status: AC
  Administered 2020-09-19: 40 meq
  Filled 2020-09-19: qty 2

## 2020-09-19 MED ORDER — ENOXAPARIN SODIUM 40 MG/0.4ML IJ SOSY
40.0000 mg | PREFILLED_SYRINGE | INTRAMUSCULAR | Status: DC
  Administered 2020-09-19: 40 mg via SUBCUTANEOUS
  Filled 2020-09-19: qty 0.4

## 2020-09-19 NOTE — Plan of Care (Signed)
Pt voiding adequately with condom catheter. Skin intact. TF resumed.

## 2020-09-19 NOTE — Progress Notes (Signed)
NAME:  Brent [name], MRN:  [id number], DOB:  08-Oct-1965, LOS: 8 ADMISSION DATE:  09/11/2020, CONSULTATION DATE:  7/31 REFERRING MD:  Tat, CHIEF COMPLAINT:  respiratory failure    History of Present Illness:  55 year old male patient with history as mentioned below presented from home with chief complaint of shortness of breath and new onset of worsening left-sided weakness and facial droop .  Telemetry neurology consult was obtained.  CT head and neck completed there was no acute hemorrhage or core infarct he was admitted to the inpatient service with working diagnosis of acute respiratory failure secondary to aspiration pneumonia, sepsis, and new stroke he last smoked cocaine on 7/25. Hospital course Was treated at [facility] for the above, from 7/25 through 7/31 Reason for medical records he had continued episodes of desaturation, a barium swallow was completed on 7/28 which showed moderate pharyngeal phase dysphagia and witnessed aspiration.  He was seen on 7/29 by palliative care medicine for goals of care at this point verified full code, with plan that he would likely need a G-tube for alternative nutrition.  On 7/29 he began to require increased supplemental oxygen, with titration up to as high as 15 L high flow serial chest x-ray imaging on day of admission showed fairly clear chest x-ray however subsequently scale imaging on CT scan 7/28 new left lower lobe consolidation, and subsequent plain imaging on July 30 showed progressive and marked left-sided atelectasis and then on the 31st complete opacification of left hemithorax.  Because of her worsening chest x-ray, and increased oxygen needs he was transferred to [facility] for possible bronchoscopy  Pertinent  Medical History  Hypertension, hyperlipidemia, diabetes type 2, tobacco abuse.  Stroke .  With left hemiparesis.  Cocaine abuse.  OSA. GERD.  Prior seizure disorder.  Prior tracheostomy (remote), and not able to tolerate  CPAP. Significant Hospital Events: Including procedures, antibiotic start and stop dates in addition to other pertinent events   7/25 admitted with aspiration pneumonia and new stroke with worsening left-sided left hemiparesis and facial droop as well as slurred speech.  This was over a 2 to 3-day period of time, in regards to the weakness so was not a tPA or neuro IR candidate. He was admitted therapeutic interventions included antiplatelet therapy, for stroke.  He was placed on azithromycin and Zosyn. 7/28 increasing oxygen needs.  Barium swallow demonstrating aspiration.  Interventional radiology consulted for possible PEG. 7/29 ongoing increased oxygen needs.  Goals of care conversation with palliative care.  Patient wanting full code okay with PEG tube if needed 7/30 through 7/31: Increasing oxygen requirements, worsening left-sided atelectasis to the point of complete opacification of the left hemithorax on time of transfer to [facility] on 7/31.  Azithromycin discontinued.  Interim History / Subjective:  Allowed NTS overnight, still copious purulent secretions.   Objective   Blood pressure (!) 149/91, pulse 62, temperature 98.6 F (37 C), temperature source Oral, resp. rate 17, height 6' (1.829 m), weight 82.8 kg, SpO2 95 %.    FiO2 (%):  [50 %-80 %] 80 %   Intake/Output Summary (Last 24 hours) at 09/19/2020 0825 Last data filed at 09/19/2020 0700 Gross per 24 hour  Intake 1108.63 ml  Output 455 ml  Net 653.63 ml    Filed Weights   09/15/20 0500 09/17/20 0500 09/18/20 0440  Weight: 89.5 kg 82.1 kg 82.8 kg    Examination: General: ill appearing man lying in bed watching TV HENT: [place]/AT, eyes anicteric Neck:  healed prior trach scar Lungs: on CPT, minimal rhonchi,down to 15L, 80% [place] Cardiovascular: S1S2, RRR Abdomen: soft, NT, ND Extremities: no cyanosis or edema Neuro: awake and alert, trying harder to coordinate a cough on demand. Able to wiggle toes on left, squeezes fingers  with left hand and can partially lift lower arm off the bed, but trying to assist with right arm. Dysarthria slowly improving. Derm: warm, dry, no rashes.   CXR personally reviewed> most of LUL reopened, still atelectasis in LLL  WBC 10.8 BUN 21 Cr 0.88 Trach aspirate  8/2: GPC> MRSA nares 8/2 negative  Resolved Hospital Problem list    Sepsis  Assessment & Plan:  Acute hypoxic respiratory failure secondary to aspiration pneumonia/mucous plugging with complete collapse of the left lung> improving with pulmonary hygiene. Has demonstrated significant impulsivity and noncompliance with dietary restrictions--PEG placement today.  He is to have very poor cough mechanics. Dysphagia with aspiration identified on modified barium swallow 7/28 Plan - Needs ongoing ICU monitoring as he remains high risk for intubation and requires frequent respiratory intervention. Intubation seems risky given that he will be hard to get off the vent with such poor respiratory mechanics. He is improving with noninvasive measures. -con't mucinex BID -Continue hypertonic saline, CPT, NTS. -Continue Zosyn; seems to be needing a longer course, likely due to poor clearance of secretions. Can stop vanc given repeat negative MRSA nares. Follow resp culture until finalized. -Con't supplemental O2, wean as tolerated. Has shunt physiology with variable O2 requirements. -repeat CXR in a few days  History of tobacco abuse, with COPD exacerbation. Previously is followed by Dr. Maple Hudson  -Continue bronchodilators - Steroids tapering- con't prednisone  History of obstructive sleep apnea- AHI 7.8.  Not on CPAP PTA. -Nocturnal supplemental oxygen needed at baseline  Acute ischemic stroke involving the right posterior temporal occipital lobe.  Exacerbated by cocaine abuse. 4 previous strokes. -Continue neuro monitoring -Seen by neurology admission, eventually will resume dual antiplatelet therapy with aspirin and Plavix, these  are currently on hold pending PEG tube placement today- have talked to radiology about the need to get these meds started. -Will likely need CIR when more stable from pulmonary standpoint  History of chronic back pain and polysubstance abuse -Continuing gabapentin when more stable from a pulmonary standpoint. -As needed Tylenol. Can add heat compresses or topical lidocaine if needed, but has been ok here.  History of hypertension -Okay to continue mild permissive hypertension; has had some low normal Bps intermixed. Bps in a tolerable range.  Can resume metoprolol and benazepril slowly and monitor for worsening neuro symptoms.  Hyperlipidemia -Cont' PTA Crestor and fenofibrate.  Diabetes type 2 with hyperglycemia- less controlled with needing to hold long acting insulin while TF have been off for procedures. -Continue sliding scale insulin as needed - Goal BG 140-180. - holding levemir while off TF today  H/o seizure d/o -con't PTA tegretol  Tobacco abuse -nicotine replacement therapy  -recommend cessation   Best Practice (right click and "Reselect all SmartList Selections" daily)   Diet/type: NPO DVT prophylaxis: LMWH-on hold for PEG GI prophylaxis: N/A Lines: N/A Foley:  N/A Code Status:  full code Last date of multidisciplinary goals of care discussion [7/28]  Labs   CBC: Recent Labs  Lab 09/15/20 0449 09/16/20 0427 09/17/20 0504 09/18/20 0404 09/19/20 0223  WBC 14.4* 19.2* 17.2* 13.4* 10.8*  HGB 14.3 14.5 14.0 13.5 13.7  HCT 44.0 44.5 43.6 42.1 41.1  MCV 98.4 96.1 96.9 97.9 96.0  PLT 315  415* 375 355 343     Basic Metabolic Panel: Recent Labs  Lab 09/15/20 0449 09/16/20 0427 09/16/20 1632 09/17/20 0504 09/17/20 1652 09/18/20 0404 09/19/20 0223  NA 145 145  --  144  --  148* 143  K 3.9 3.7  --  3.9  --  3.8 3.7  CL 107 109  --  107  --  110 107  CO2 28 27  --  25  --  25 27  GLUCOSE 148* 161*  --  226*  --  126* 197*  BUN 29* 24*  --  26*  --   21* 21*  CREATININE 0.86 0.87  --  0.88  --  0.85 0.88  CALCIUM 9.4 9.6  --  9.7  --  9.7 9.4  MG 1.8 1.6* 2.3 1.9 1.7 1.6*  --   PHOS 2.7 2.4* 2.1* 2.6 2.2* 3.3  --     GFR: Estimated Creatinine Clearance: 104.1 mL/min (by C-G formula based on SCr of 0.88 mg/dL). Recent Labs  Lab 09/12/20 1020 09/13/20 0438 09/16/20 0427 09/17/20 0504 09/18/20 0404 09/19/20 0223  PROCALCITON <0.10  --   --   --   --   --   WBC 18.7*   < > 19.2* 17.2* 13.4* 10.8*   < > = values in this interval not displayed.     Liver Function Tests: Recent Labs  Lab 09/12/20 1020 09/13/20 0438  AST 13* 13*  ALT 12 12  ALKPHOS 78 70  BILITOT 0.6 0.7  PROT 7.5 7.0  ALBUMIN 3.8 3.5    No results for input(s): LIPASE, AMYLASE in the last 168 hours. No results for input(s): AMMONIA in the last 168 hours.  ABG    Component Value Date/Time   PHART 7.432 09/14/2020 1812   PCO2ART 44.0 09/14/2020 1812   PO2ART 43.4 (L) 09/14/2020 1812   HCO3 27.8 09/14/2020 1812   O2SAT 79.7 09/14/2020 1812      Coagulation Profile: No results for input(s): INR, PROTIME in the last 168 hours.   Cardiac Enzymes: No results for input(s): CKTOTAL, CKMB, CKMBINDEX, TROPONINI in the last 168 hours.  HbA1C: Hgb A1c MFr Bld  Date/Time Value Ref Range Status  09/12/2020 04:10 AM 6.4 (H) 4.8 - 5.6 % Final    Comment:    (NOTE)         Prediabetes: 5.7 - 6.4         Diabetes: >6.4         Glycemic control for adults with diabetes: <7.0   07/26/2020 05:59 AM 7.0 (H) 4.8 - 5.6 % Final    Comment:    (NOTE)         Prediabetes: 5.7 - 6.4         Diabetes: >6.4         Glycemic control for adults with diabetes: <7.0     CBG: Recent Labs  Lab 09/18/20 1538 09/18/20 1925 09/18/20 2311 09/19/20 0320 09/19/20 0716  GLUCAP 141* 256* 201* 128* 116*    This patient is critically ill with multiple organ system failure which requires frequent high complexity decision making, assessment, support, evaluation,  and titration of therapies. This was completed through the application of advanced monitoring technologies and extensive interpretation of multiple databases. During this encounter critical care time was devoted to patient care services described in this note for 32 minutes.  Steffanie Dunn, DO 09/19/20 8:38 AM [facility]

## 2020-09-19 NOTE — Progress Notes (Signed)
  Chart reviewed - per Dr. Chestine Spore note, needs ongoing ICU monitoring and remains high risk for intubation.  He is NOT currently medically stable for elective gastrostomy tube placement.  Will hold off until patient more medically stable.  Can restart NGT feeds.  Tailey Top S Jerris Keltz PA-C 09/19/2020 9:38 AM

## 2020-09-19 NOTE — Progress Notes (Signed)
Physical Therapy Treatment Patient Details Name: Brent [name] MRN: [id number] DOB: 02-16-1966 Today's Date: 09/19/2020    History of Present Illness 55 y.o. male presenting to ED with SOB, slurred speech and L sided weakness x2-3 days. Patient admitted with acute respiratory failure with hypoxia secondary to aspiration PNA, COPD exacerbation and sepsis. Imaging (+) R ACA CVA x2 within R cingulate gyrus/callosal body, R parietal lobe subcortical Brent [name] matter infarct and questionable acute punctate infarct within L frontal lobe subcortical Brent [name] matter. Transferred to ICU at [name] 7/31 due to ongoing respiratory failure with complete opacification of left hemithorax.   PMHx significant for cocaine and tobacco use disorder, lumbago, HTN, HLD, DMII and Hx of CVA.    PT Comments    Pt admitted with above diagnosis. Pt was able to tolerate several stands in [name] with mod to max assist of 2.  Pt on 15LO2 HFNC with sats with activity 85% but rebounds to 88% and greater once at rest.   Pt currently with functional limitations due to balance and endurance deficits. Pt will benefit from skilled PT to increase their independence and safety with mobility to allow discharge to the venue listed below.      Follow Up Recommendations  CIR     Equipment Recommendations  None recommended by PT    Recommendations for Other Services       Precautions / Restrictions Precautions Precautions: Fall Precaution Comments: HFNC 15 L Restrictions Weight Bearing Restrictions: No    Mobility  Bed Mobility Overal bed mobility: Needs Assistance Bed Mobility: Supine to Sit Rolling: Mod assist;+2 for physical assistance   Supine to sit: +2 for physical assistance;Mod assist;HOB elevated Sit to supine: Mod assist;Max assist;+2 for physical assistance   General bed mobility comments: Pt able to come to EOB with assist for Left LE as well as assist at trunk for elevation with pt using rightUE to pull on therapist  hand.  Needed assist to scoot out with pad.  Desat initially at EOB and took a few min to recover to >88% on HFNC.    Transfers Overall transfer level: Needs assistance   Transfers: Sit to/from Stand Sit to Stand: Mod assist;+2 physical assistance;+2 safety/equipment;Max assist;From elevated surface         General transfer comment: Pt was able to stand to [name] with first attempt max assist with pt not able to completely stand. Raised bed and then pt stood 3 more x with mod assist of 2 with asssist at buttocks to extend as well as at trunk for close to full upright trunk with tactile support at clavicles.  Pt able to stand for about 10 seconds at a time.  Again, desaturates with activity and needs rest breaks, cues for pursed lip breathing. Sats 85% and recovered once sitting to >88%.   Pt asked to go outside therefore moved pt in [name] to the window so he can look out as he cant go out due to oxygen and desaturation with activity. Pt was fatigued. Notified nurse that sats at end of treatment were hovering between 88-90% with pt still on 15L. Pt placed back in bed in chair position as he stated he cannot be left up in chair.  Ambulation/Gait             General Gait Details: unable   Stairs             Wheelchair Mobility    Modified Rankin (Stroke Patients Only) Modified Rankin (Stroke Patients Only) Pre-Morbid  Rankin Score: Moderate disability Modified Rankin: Severe disability     Balance Overall balance assessment: Needs assistance Sitting-balance support: Feet supported;Single extremity supported Sitting balance-Leahy Scale: Poor Sitting balance - Comments: Reliant on external assist to maintain static sitting balance at EOB; LOB in all directions with tendency for left lateral lean Postural control: Left lateral lean;Right lateral lean;Posterior lean Standing balance support: Bilateral upper extremity supported;During functional activity Standing balance-Leahy  Scale: Poor Standing balance comment: Reliant on external assist +2 as well as UE support and use of STedy with poor upright postural stability                            Cognition Arousal/Alertness: Awake/alert Behavior During Therapy: WFL for tasks assessed/performed Overall Cognitive Status: No family/caregiver present to determine baseline cognitive functioning                                 General Comments: Follows 1-step verbal commands with good accuracy; A&Ox3.      Exercises General Exercises - Lower Extremity Ankle Circles/Pumps: AROM;Right;5 reps;Supine Long Arc Quad: Seated;AROM;Strengthening;AAROM;Both;10 reps Heel Slides: AAROM;Strengthening;Both;10 reps (assisted flexion and resisted extension (rt stronger))    General Comments        Pertinent Vitals/Pain Pain Assessment: 0-10 Pain Score: 9  Pain Location: lower back Pain Descriptors / Indicators: Aching Pain Intervention(s): Monitored during session;Repositioned    Home Living                      Prior Function            PT Goals (current goals can now be found in the care plan section) Acute Rehab PT Goals Patient Stated Goal: return home after rehab Progress towards PT goals: Progressing toward goals    Frequency    Min 4X/week      PT Plan Current plan remains appropriate    Co-evaluation              AM-PAC PT "6 Clicks" Mobility   Outcome Measure  Help needed turning from your back to your side while in a flat bed without using bedrails?: A Lot Help needed moving from lying on your back to sitting on the side of a flat bed without using bedrails?: Total Help needed moving to and from a bed to a chair (including a wheelchair)?: Total Help needed standing up from a chair using your arms (e.g., wheelchair or bedside chair)?: Total Help needed to walk in hospital room?: Total Help needed climbing 3-5 steps with a railing? : Total 6 Click Score:  7    End of Session Equipment Utilized During Treatment: Oxygen;Gait belt Activity Tolerance: Patient limited by fatigue Patient left: with call bell/phone within reach;in bed;with bed alarm set (chair posiion) Nurse Communication: Mobility status;Need for lift equipment PT Visit Diagnosis: Unsteadiness on feet (R26.81);Other abnormalities of gait and mobility (R26.89);Muscle weakness (generalized) (M62.81)     Time: 0086-7619 PT Time Calculation (min) (ACUTE ONLY): 19 min  Charges:  $Therapeutic Activity: 8-22 mins                     Suzana Sohail M,PT Acute Rehab Services [phone] (pager)    Bevelyn Buckles 09/19/2020, 12:24 PM

## 2020-09-19 NOTE — Progress Notes (Signed)
  Speech Language Pathology Treatment: Dysphagia  Patient Details Name: Brent [name] MRN: [id number] DOB: [birth year] Today's Date: 09/19/2020 Time: 8466-5993 SLP Time Calculation (min) (ACUTE ONLY): 21 min  Assessment / Plan / Recommendation Clinical Impression  Pt was seen for dysphagia treatment with his nurse present at bedside. Pt's RN reported that the pt still requires nasotracheal suctioning but has been maintaining SpO2 ~94% since recent suctioning. P.o. trials were deferred during this session due to pt's current respiratory status. Pt requested multiple foods and liquids during this session and his RN reported that he has been doing this throughout her shift. Pt was educated regarding the results of the modified barium swallow study, his risk of aspiration, and the rationale for his NPO status. Video recording of the study was used to facilitate education and pt verbalized understanding regarding all areas of education. However, considering the nature of pt's statements, and his subsequent requests for food and liquid, reinforcement will likely be needed. Pt completed effortful swallows with cues for effort. Pt judged his effort to be 9-10/10, but these swallows appeared similar to his baseline dry swallow. SLP will continue to follow pt.      HPI HPI: Pt is a 55 y/o male who presented to the ED with SOB and was noted to have L hemiparesis and significant dysarthria. Patient admitted to [facility] with acute respiratory failure with hypoxia secondary to PNA, COPD exacerbation and sepsis. Imaging revealed R ACA CVA x2 within R cingulate gyrus/callosal body, R parietal lobe subcortical white matter infarct and questionable acute punctate infarct within L frontal lobe subcortical white matter. Transferred to ICU at [facility] 7/31 due to ongoing respiratory failure with complete opacification of left hemithorax. PMH: cocaine and tobacco use disorder, lumbago, HTN, HLD, DMII and Hx of CVA. Pt with known  dysphagia and noncompliance with recommendations. MBS completed in June with recommendation for regular textures and honey thick liquids. MBS 7/28: oropharyngeal dysphagia with consistent small amounts of aspiration which are not cleared by pt following weak delayed cough. despite. Weak bolus manipulation, reduced bolus cohesion, a pharyngeal delay, and reduced laryngeal vestibule closure. An NPO status with alternate means of nutrition was recommended at that time. Pt/family have agreed to PEG, but as of 8/4, placement has been deferred due to pt's high risk for intubation.      SLP Plan  Continue with current plan of care       Recommendations  Diet recommendations: NPO Medication Administration: Via alternative means                Oral Care Recommendations: Oral care QID Follow up Recommendations: Skilled Nursing facility SLP Visit Diagnosis: Dysphagia, oropharyngeal phase (R13.12) Plan: Continue with current plan of care       Lanah Steines I. Vear Clock, MS, CCC-SLP Acute Rehabilitation Services Office number [phone] Pager [phone]                Scheryl Marten 09/19/2020, 1:11 PM

## 2020-09-19 NOTE — Progress Notes (Signed)
[facility] ADULT ICU REPLACEMENT PROTOCOL   The patient does apply for the [facility] Adult ICU Electrolyte Replacment Protocol based on the criteria listed below:   1.Exclusion criteria: TCTS patients, ECMO patients and Hypothermia Protocol, and   Dialysis patients 2. Is GFR >/= 30 ml/min? Yes.    Patient's GFR today is >60 3. Is SCr </= 2? Yes.   Patient's SCr is 0.88 mg/dL 4. Did SCr increase >/= 0.5 in 24 hours? No. 5.Pt's weight >40kg  Yes.   6. Abnormal electrolyte(s): K 3.7  7. Electrolytes replaced per protocol   Ardelle Park 09/19/2020 6:15 AM

## 2020-09-20 ENCOUNTER — Inpatient Hospital Stay (HOSPITAL_COMMUNITY): Payer: Medicaid Other

## 2020-09-20 DIAGNOSIS — R531 Weakness: Secondary | ICD-10-CM | POA: Diagnosis not present

## 2020-09-20 DIAGNOSIS — J9621 Acute and chronic respiratory failure with hypoxia: Secondary | ICD-10-CM | POA: Diagnosis not present

## 2020-09-20 LAB — CBC
HCT: 42.1 % (ref 39.0–52.0)
Hemoglobin: 14.2 g/dL (ref 13.0–17.0)
MCH: 32.4 pg (ref 26.0–34.0)
MCHC: 33.7 g/dL (ref 30.0–36.0)
MCV: 96.1 fL (ref 80.0–100.0)
Platelets: 322 10*3/uL (ref 150–400)
RBC: 4.38 MIL/uL (ref 4.22–5.81)
RDW: 13 % (ref 11.5–15.5)
WBC: 10.5 10*3/uL (ref 4.0–10.5)
nRBC: 0 % (ref 0.0–0.2)

## 2020-09-20 LAB — GLUCOSE, CAPILLARY
Glucose-Capillary: 228 mg/dL — ABNORMAL HIGH (ref 70–99)
Glucose-Capillary: 231 mg/dL — ABNORMAL HIGH (ref 70–99)
Glucose-Capillary: 235 mg/dL — ABNORMAL HIGH (ref 70–99)
Glucose-Capillary: 244 mg/dL — ABNORMAL HIGH (ref 70–99)
Glucose-Capillary: 251 mg/dL — ABNORMAL HIGH (ref 70–99)
Glucose-Capillary: 280 mg/dL — ABNORMAL HIGH (ref 70–99)

## 2020-09-20 LAB — BASIC METABOLIC PANEL
Anion gap: 10 (ref 5–15)
BUN: 19 mg/dL (ref 6–20)
CO2: 24 mmol/L (ref 22–32)
Calcium: 9.3 mg/dL (ref 8.9–10.3)
Chloride: 111 mmol/L (ref 98–111)
Creatinine, Ser: 0.87 mg/dL (ref 0.61–1.24)
GFR, Estimated: >60 mL/min (ref >60)
Glucose, Bld: 206 mg/dL — ABNORMAL HIGH (ref 70–99)
Potassium: 3.9 mmol/L (ref 3.5–5.1)
Sodium: 145 mmol/L (ref 135–145)

## 2020-09-20 LAB — MAGNESIUM: Magnesium: 1.6 mg/dL — ABNORMAL LOW (ref 1.7–2.4)

## 2020-09-20 IMAGING — DX DG CHEST 1V PORT
1 series · 1 of 1 positions shown · non-contrast
Comparison: [DATE]

CLINICAL DATA: Shortness of breath.

EXAM:
PORTABLE CHEST 1 VIEW

[chest ap]
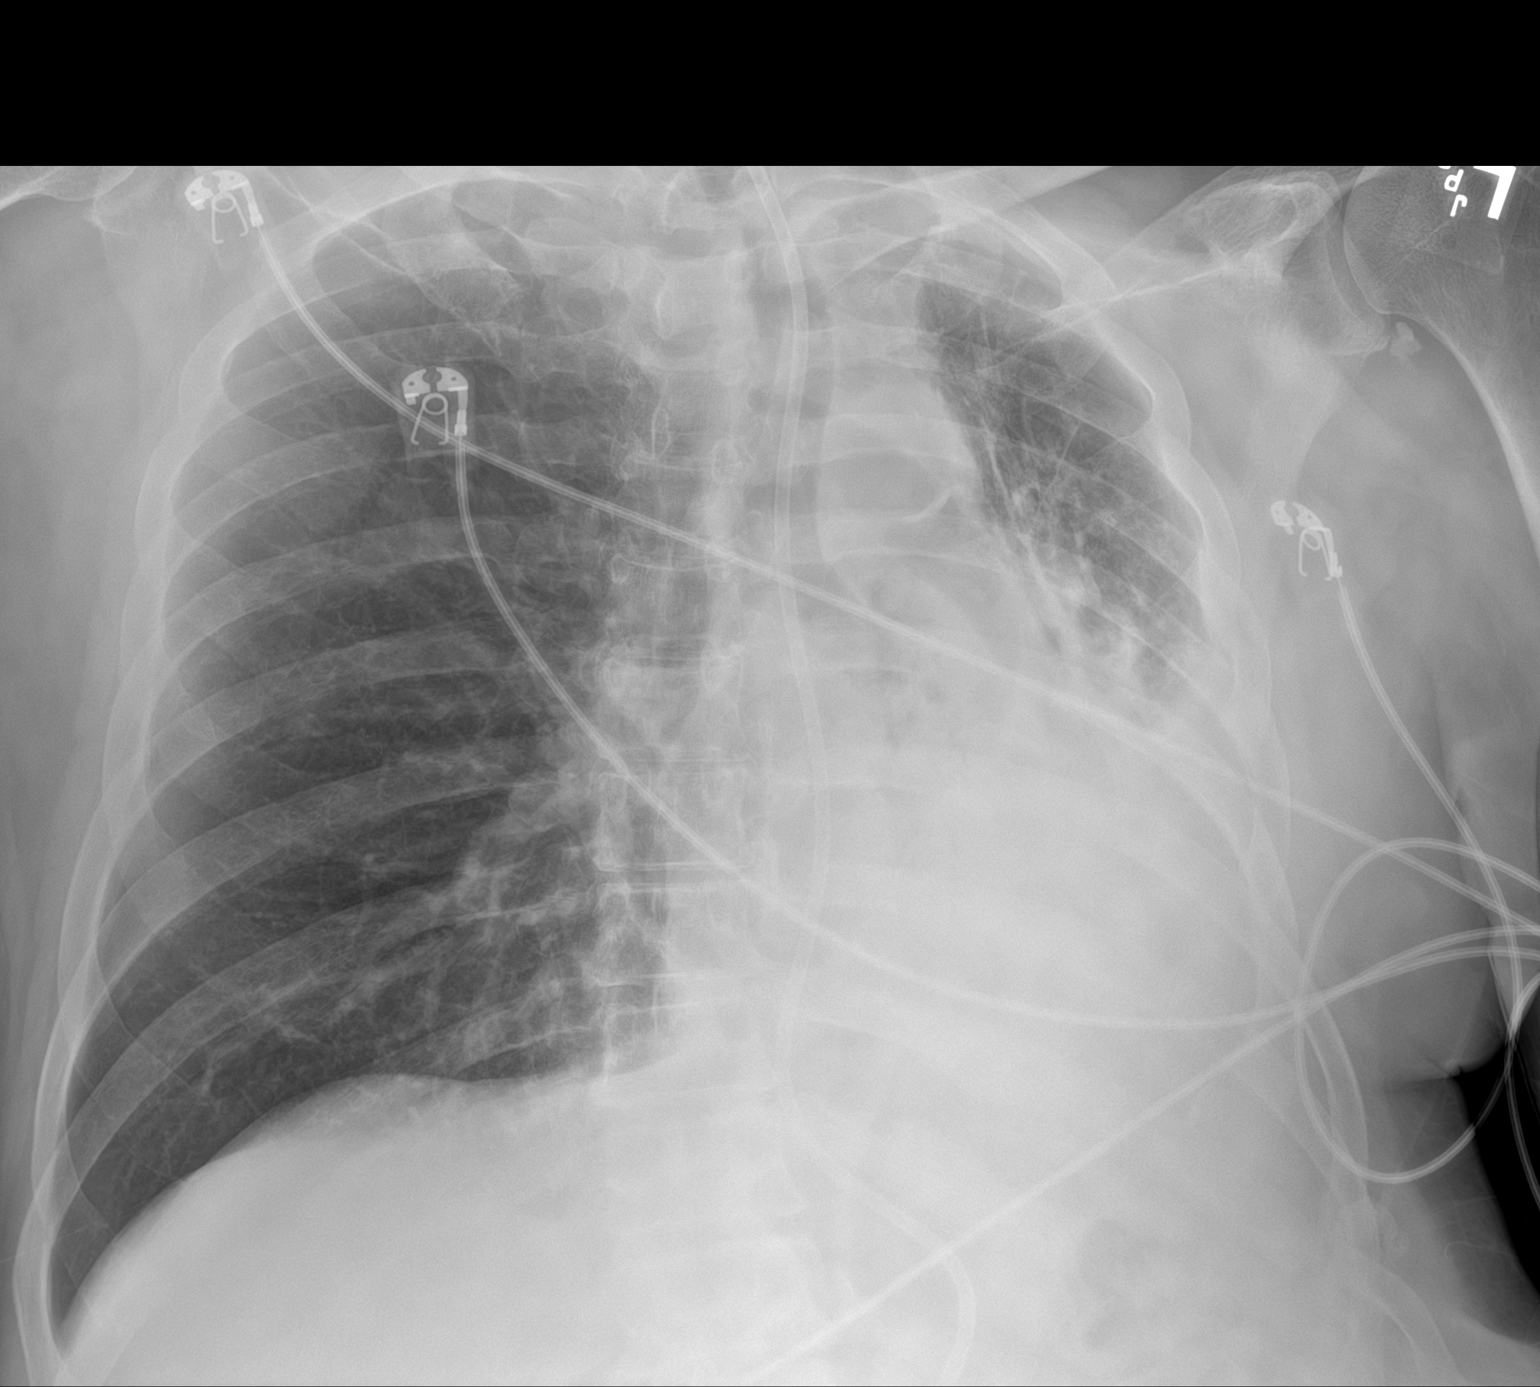

[1 of 1 positions shown; findings below may reference images not displayed]

FINDINGS: 0 [WN] hours. Volume loss left hemithorax again noted with left base
collapse/consolidative opacity. Right lung is hyperexpanded but
clear. A feeding tube passes into the stomach although the distal
tip position is not included on the film. Telemetry leads overlie
the chest.
IMPRESSION: Volume loss left hemithorax with left base collapse/consolidative
opacity. No substantial change.

## 2020-09-20 MED ORDER — ENOXAPARIN SODIUM 40 MG/0.4ML IJ SOSY: 40.0000 mg | PREFILLED_SYRINGE | INTRAMUSCULAR | Status: DC

## 2020-09-20 MED ORDER — CLOPIDOGREL BISULFATE 75 MG PO TABS: 75.0000 mg | ORAL_TABLET | Freq: Every day | ORAL | Status: DC

## 2020-09-20 MED ORDER — INSULIN DETEMIR 100 UNIT/ML ~~LOC~~ SOLN
15.0000 [IU] | Freq: Two times a day (BID) | SUBCUTANEOUS | Status: DC
  Administered 2020-09-20 – 2020-09-21 (×3): 15 [IU] via SUBCUTANEOUS
  Filled 2020-09-20 (×5): qty 0.15

## 2020-09-20 MED ORDER — MAGNESIUM SULFATE 2 GM/50ML IV SOLN
2.0000 g | Freq: Once | INTRAVENOUS | Status: AC
  Administered 2020-09-20: 2 g via INTRAVENOUS
  Filled 2020-09-20: qty 50

## 2020-09-20 NOTE — Progress Notes (Cosign Needed Addendum)
Pt appears to be more stable today. Plan for gastrostomy tube placement 8/9 d/t plavix given last pm.  Per IR anticoagulation guidelines, plavix must be held 5 days prior to procedure    Electronically Signed: Shon Hough 09/20/2020, 10:07 AM

## 2020-09-20 NOTE — Progress Notes (Signed)
Inpatient Rehabilitation Admissions Coordinator   I met with pateint at bedside and spoke with his girlfriend, [name], by phone. We plan to meet today upon her arrival to begin discussion concerning his rehab venue options.  Danne Baxter, RN, MSN Rehab Admissions Coordinator [phone] 09/20/2020 11:00 AM

## 2020-09-20 NOTE — Progress Notes (Signed)
Inpatient Rehabilitation Admissions Coordinator   I met with patient and his significant other, [name], at bedside. I discussed goals and expectations of a possible CIR admit pending his ability to tolerate more intensive therapy once he improves medically. They prefer CIR admit rather than SNF. She can provided 24/7 physical assistance. I will follow up next week.   Danne Baxter, RN, MSN Rehab Admissions Coordinator [phone] 09/20/2020 3:39 PM

## 2020-09-20 NOTE — Progress Notes (Signed)
Inpatient Diabetes Program Recommendations  AACE/ADA: New Consensus Statement on Inpatient Glycemic Control (2015)  Target Ranges:  Prepandial:   less than 140 mg/dL      Peak postprandial:   less than 180 mg/dL (1-2 hours)      Critically ill patients:  140 - 180 mg/dL   Lab Results  Component Value Date   GLUCAP 231 (H) 09/20/2020   HGBA1C 6.4 (H) 09/12/2020    Review of Glycemic Control Results for Brent [name], Brent [name] (MRN [id number]) as of 09/20/2020 09:26  Ref. Range 09/19/2020 19:32 09/19/2020 23:24 09/20/2020 03:45 09/20/2020 07:39  Glucose-Capillary Latest Ref Range: 70 - 99 mg/dL 462 (H) 95 703 (H) 500 (H)   Diabetes history: Type 2 DM Outpatient Diabetes medications: Janumet 50-1000 mg BID, Lantus 70 units QD Current orders for Inpatient glycemic control: Novolog 0-20 units Q4H, Novolog 3 units Q4H Osmolite @65  ml/hr Prednisone 30 mg QD Inpatient Diabetes Program Recommendations:    Consider adding Semglee 10 units QD.   Thanks, , MSN, RNC-OB Diabetes Coordinator [phone] (8a-5p)

## 2020-09-20 NOTE — Progress Notes (Signed)
Occupational Therapy Treatment Patient Details Name: Brent [name] MRN: [id number] DOB: 21-Jul-1965 Today's Date: 09/20/2020    History of present illness 55 y.o. male presenting to ED with SOB, slurred speech and L sided weakness x2-3 days. Patient admitted with acute respiratory failure with hypoxia secondary to aspiration PNA, COPD exacerbation and sepsis. Imaging (+) R ACA CVA x2 within R cingulate gyrus/callosal body, R parietal lobe subcortical white matter infarct and questionable acute punctate infarct within L frontal lobe subcortical white matter. Transferred to ICU at [facility] 7/31 due to ongoing respiratory failure with complete opacification of left hemithorax.   PMHx significant for cocaine and tobacco use disorder, lumbago, HTN, HLD, DMII and Hx of CVA.   OT comments  Patient met lying supine in bed after chest PT. SpO2 93-95% on 15L HFNC throughout session. Focus of session on bed mobility (to affected side) requiring Mod A, sit to stand transfers with use of Stedy and Max A and NMR. Patient making progress toward goals and remains motivated. Continued recommendation for CIR.    Follow Up Recommendations  CIR    Equipment Recommendations  Other (comment) (Defer to next level of care)    Recommendations for Other Services      Precautions / Restrictions Precautions Precautions: Fall Precaution Comments: HFNC 15 L Restrictions Weight Bearing Restrictions: No       Mobility Bed Mobility Overal bed mobility: Needs Assistance Bed Mobility: Supine to Sit Rolling: Mod assist   Supine to sit: Mod assist;HOB elevated     General bed mobility comments: Mod A for rolling to L (no longer on HHFNC) and Mod A for sidelying to EOB with HOB elevated and cues for hand palcement.    Transfers Overall transfer level: Needs assistance   Transfers: Sit to/from Stand Sit to Stand: Max assist         General transfer comment: Max A +1 from elevated EOB with increased time to  rise; cues for orientation to midline. Improvement in posture with visual feedback from mirror.    Balance Overall balance assessment: Needs assistance Sitting-balance support: Feet supported;Single extremity supported Sitting balance-Leahy Scale: Poor Sitting balance - Comments: Reliant on external assist to maintain static sitting balance at EOB; LOB in all directions with tendency for left lateral lean; improvement noted this date. Postural control: Left lateral lean;Right lateral lean;Posterior lean Standing balance support: Bilateral upper extremity supported;During functional activity Standing balance-Leahy Scale: Poor Standing balance comment: Reliant on external assist as well as UE support and use of Stedy with poor upright postural stability; noted improvement this date with visual feedback from mirror.                           ADL either performed or assessed with clinical judgement   ADL                       Lower Body Dressing: Total assistance;Bed level Lower Body Dressing Details (indicate cue type and reason): To don footwear               General ADL Comments: Patient greatly limited by L hemiplegia, visual deficits, increased need for supplemental O2 via HHFNC, decreased static sitting balance and need for +2 assist for all bed mobility and functional transfers grossly.     Vision       Perception     Praxis      Cognition Arousal/Alertness: Awake/alert Behavior During Therapy:  WFL for tasks assessed/performed Overall Cognitive Status: No family/caregiver present to determine baseline cognitive functioning                                 General Comments: Follows 1-step verbal commands with good accuracy; A&Ox3.        Exercises General Exercises - Upper Extremity Shoulder Flexion: AROM;Right;10 reps Elbow Flexion: AROM;Right;10 reps Elbow Extension: AROM;Right;10 reps   Shoulder Instructions       General  Comments SpO2 95% on 15L HFNC upon entry. Remained 93-95% with activity. RN reports recent chest PT.    Pertinent Vitals/ Pain       Pain Assessment: Faces Faces Pain Scale: Hurts a little bit Pain Location: lower back Pain Descriptors / Indicators: Aching Pain Intervention(s): Limited activity within patient's tolerance;Monitored during session;Repositioned  Home Living                                          Prior Functioning/Environment              Frequency  Min 2X/week        Progress Toward Goals  OT Goals(current goals can now be found in the care plan section)  Progress towards OT goals: Progressing toward goals  Acute Rehab OT Goals Patient Stated Goal: return home after rehab OT Goal Formulation: With patient Time For Goal Achievement: 09/26/20 Potential to Achieve Goals: Fair ADL Goals Pt Will Perform Grooming: with min assist;with min guard assist;sitting;with adaptive equipment Pt Will Perform Upper Body Dressing: with min assist;with min guard assist;sitting;bed level Pt Will Perform Lower Body Dressing: with mod assist;with max assist;sitting/lateral leans;bed level;with adaptive equipment Pt Will Transfer to Toilet: with mod assist;stand pivot transfer;bedside commode Pt/caregiver will Perform Home Exercise Program: Increased ROM;Left upper extremity;With minimal assist;With Supervision  Plan Discharge plan remains appropriate;Frequency remains appropriate    Co-evaluation                 AM-PAC OT "6 Clicks" Daily Activity     Outcome Measure   Help from another person eating meals?: Total (Cortrak; plan for PEG) Help from another person taking care of personal grooming?: A Lot Help from another person toileting, which includes using toliet, bedpan, or urinal?: Total Help from another person bathing (including washing, rinsing, drying)?: A Lot Help from another person to put on and taking off regular upper body  clothing?: A Lot Help from another person to put on and taking off regular lower body clothing?: Total 6 Click Score: 9    End of Session Equipment Utilized During Treatment: Gait belt;Other (comment)  OT Visit Diagnosis: Unsteadiness on feet (R26.81);Muscle weakness (generalized) (M62.81);Other symptoms and signs involving the nervous system (R29.898);Hemiplegia and hemiparesis Hemiplegia - Right/Left: Left Hemiplegia - dominant/non-dominant: Non-Dominant Hemiplegia - caused by: Cerebral infarction   Activity Tolerance Patient tolerated treatment well;Patient limited by fatigue   Patient Left in chair;with call bell/phone within reach;with chair alarm set   Nurse Communication Mobility status;Other (comment) (Response to treatment)        Time: 1220-1253 OT Time Calculation (min): 33 min  Charges: OT General Charges $OT Visit: 1 Visit OT Treatments $Therapeutic Activity: 8-22 mins $Neuromuscular Re-education: 8-22 mins  Eston Heslin H. OTR/L Supplemental OT, Department of rehab services [phone]   Kaleel Schmieder R H. 09/20/2020, 1:35 PM

## 2020-09-20 NOTE — Progress Notes (Addendum)
NAME:  Brent [name], MRN:  [id number], DOB:  1965/07/18, LOS: 9 ADMISSION DATE:  09/11/2020, CONSULTATION DATE:  7/31 REFERRING MD:  Tat, CHIEF COMPLAINT:  respiratory failure    History of Present Illness:  55 year old male patient with history as mentioned below presented from home with chief complaint of shortness of breath and new onset of worsening left-sided weakness and facial droop .  Telemetry neurology consult was obtained.  CT head and neck completed there was no acute hemorrhage or core infarct he was admitted to the inpatient service with working diagnosis of acute respiratory failure secondary to aspiration pneumonia, sepsis, and new stroke he last smoked cocaine on 7/25. Hospital course Was treated at [facility] for the above, from 7/25 through 7/31 Reason for medical records he had continued episodes of desaturation, a barium swallow was completed on 7/28 which showed moderate pharyngeal phase dysphagia and witnessed aspiration.  He was seen on 7/29 by palliative care medicine for goals of care at this point verified full code, with plan that he would likely need a G-tube for alternative nutrition.  On 7/29 he began to require increased supplemental oxygen, with titration up to as high as 15 L high flow serial chest x-ray imaging on day of admission showed fairly clear chest x-ray however subsequently scale imaging on CT scan 7/28 new left lower lobe consolidation, and subsequent plain imaging on July 30 showed progressive and marked left-sided atelectasis and then on the 31st complete opacification of left hemithorax.  Because of her worsening chest x-ray, and increased oxygen needs he was transferred to [facility] for possible bronchoscopy  Pertinent  Medical History  Hypertension, hyperlipidemia, diabetes type 2, tobacco abuse.  Stroke .  With left hemiparesis.  Cocaine abuse.  OSA. GERD.  Prior seizure disorder.  Prior tracheostomy (remote), and not able to tolerate  CPAP. Significant Hospital Events: Including procedures, antibiotic start and stop dates in addition to other pertinent events   7/25 admitted with aspiration pneumonia and new stroke with worsening left-sided left hemiparesis and facial droop as well as slurred speech.  This was over a 2 to 3-day period of time, in regards to the weakness so was not a tPA or neuro IR candidate. He was admitted therapeutic interventions included antiplatelet therapy, for stroke.  He was placed on azithromycin and Zosyn. 7/28 increasing oxygen needs.  Barium swallow demonstrating aspiration.  Interventional radiology consulted for possible PEG. 7/29 ongoing increased oxygen needs.  Goals of care conversation with palliative care.  Patient wanting full code okay with PEG tube if needed 7/30 through 7/31: Increasing oxygen requirements, worsening left-sided atelectasis to the point of complete opacification of the left hemithorax on time of transfer to [facility] on 7/31.  Azithromycin discontinued. Plavix was stopped for her and pending PEG placement ninth  Interim History / Subjective:  No acute distress at rest.  Objective   Blood pressure (!) 157/93, pulse 79, temperature 97.7 F (36.5 C), temperature source Oral, resp. rate 18, height 6' (1.829 m), weight 82.8 kg, SpO2 90 %.        Intake/Output Summary (Last 24 hours) at 09/20/2020 [id number] Last data filed at 09/20/2020 0800 Gross per 24 hour  Intake 1661.87 ml  Output 881 ml  Net 780.87 ml   Filed Weights   09/17/20 0500 09/18/20 0440 09/20/20 0500  Weight: 82.1 kg 82.8 kg 82.8 kg    Examination: General: 55 year old male who appears older than stated age HEENT: MM pink/moist no JVD  or lymphadenopathy is appreciated nasal cannula in place at 15 L Neuro: Obvious left-sided weakness.   Left-sided weakness.  Failed swallowing evaluation plan for PEG CV: Heart sounds are regular PULM: Decreased breath sounds, currently on 15 L nasal cannula  GI: soft,  bsx4 active tube feedings in place GU: Amber urine Extremities: warm/dry, 1+ edema  Skin: no rashes or lesions O2 sats 92% on 15 L nasal cannula No chest x-ray 1 ordered Resolved Hospital Problem list    Sepsis  Assessment & Plan:  Acute hypoxic respiratory failure secondary to aspiration pneumonia/mucous plugging with complete collapse of the left lung> improving with pulmonary hygiene. Has demonstrated significant impulsivity and noncompliance with dietary restrictions- He is to have very poor cough mechanics. Dysphagia with aspiration identified on modified barium swallow 7/28 Plan Repeat chest x-ray 06/2020 to determine any improvement Remains on 15 L nasal cannula but is not in respiratory distress point Continue pulmonary toilet Continue Zosyn total of 14 days this Is a 11   PEG placement plans.  09/20/2020 Plavix discontinued continue Lovenox.  Spoke with interventional radiology plan is for PEG placement next Tuesday, August 9. Continue tube feedings until 8 8 (9 Stop Plavix Continue low molecular weight heparin 09/23/2020 will need to be n.p.o. past midnight   History of tobacco abuse, with COPD exacerbation. Previously is followed by Dr. Maple Hudson  Continue bronchodilators Continue oxygen  History of obstructive sleep apnea- AHI 7.8.  Not on CPAP PTA. -Nocturnal supplemental oxygen needed at baseline  Acute ischemic stroke involving the right posterior temporal occipital lobe.  Exacerbated by cocaine abuse. 4 previous strokes. Appreciate neurology's input Plavix and Lovenox on hold. Left-sided weakness continue  History of chronic back pain and polysubstance abuse Analgesics Avoid narcotics   History of hypertension Monitor Allow gentle rise of blood pressure from  Hyperlipidemia Continue statin  Diabetes type 2 with hyperglycemia- less controlled with needing to hold long acting insulin while TF have been off for procedures CBG (last 3)  Recent Labs     09/19/20 2324 09/20/20 0345 09/20/20 0739  GLUCAP 95 228* 231*   Sliding-scale insulin protocol Resume Levemir 85 2022 until PEG is placed at which time we will be stopped 24 hours prior with stopping of the tube feedings.  H/o seizure d/o Continue antiepileptic  Tobacco abuse Stop nicotine patch 09/20/2020 Tobacco cessation   Best Practice (right click and "Reselect all SmartList Selections" daily)   Diet/type: tubefeeds and NPO DVT prophylaxis: LMWH-on hold for PEG GI prophylaxis: N/A Lines: N/A Foley:  N/A Code Status:  full code Last date of multidisciplinary goals of care discussion [7/28]  Labs   CBC: Recent Labs  Lab 09/16/20 0427 09/17/20 0504 09/18/20 0404 09/19/20 0223 09/20/20 0238  WBC 19.2* 17.2* 13.4* 10.8* 10.5  HGB 14.5 14.0 13.5 13.7 14.2  HCT 44.5 43.6 42.1 41.1 42.1  MCV 96.1 96.9 97.9 96.0 96.1  PLT 415* 375 355 343 322    Basic Metabolic Panel: Recent Labs  Lab 09/16/20 0427 09/16/20 1632 09/17/20 0504 09/17/20 1652 09/18/20 0404 09/19/20 0223 09/20/20 0238  NA 145  --  144  --  148* 143 145  K 3.7  --  3.9  --  3.8 3.7 3.9  CL 109  --  107  --  110 107 111  CO2 27  --  25  --  25 27 24   GLUCOSE 161*  --  226*  --  126* 197* 206*  BUN 24*  --  26*  --  21* 21* 19  CREATININE 0.87  --  0.88  --  0.85 0.88 0.87  CALCIUM 9.6  --  9.7  --  9.7 9.4 9.3  MG 1.6* 2.3 1.9 1.7 1.6*  --  1.6*  PHOS 2.4* 2.1* 2.6 2.2* 3.3  --   --    GFR: Estimated Creatinine Clearance: 105.3 mL/min (by C-G formula based on SCr of 0.87 mg/dL). Recent Labs  Lab 09/17/20 0504 09/18/20 0404 09/19/20 0223 09/20/20 0238  WBC 17.2* 13.4* 10.8* 10.5    Liver Function Tests: No results for input(s): AST, ALT, ALKPHOS, BILITOT, PROT, ALBUMIN in the last 168 hours. No results for input(s): LIPASE, AMYLASE in the last 168 hours. No results for input(s): AMMONIA in the last 168 hours.  ABG    Component Value Date/Time   PHART 7.432 09/14/2020 1812    PCO2ART 44.0 09/14/2020 1812   PO2ART 43.4 (L) 09/14/2020 1812   HCO3 27.8 09/14/2020 1812   O2SAT 79.7 09/14/2020 1812      Coagulation Profile: No results for input(s): INR, PROTIME in the last 168 hours.   Cardiac Enzymes: No results for input(s): CKTOTAL, CKMB, CKMBINDEX, TROPONINI in the last 168 hours.  HbA1C: Hgb A1c MFr Bld  Date/Time Value Ref Range Status  09/12/2020 04:10 AM 6.4 (H) 4.8 - 5.6 % Final    Comment:    (NOTE)         Prediabetes: 5.7 - 6.4         Diabetes: >6.4         Glycemic control for adults with diabetes: <7.0   07/26/2020 05:59 AM 7.0 (H) 4.8 - 5.6 % Final    Comment:    (NOTE)         Prediabetes: 5.7 - 6.4         Diabetes: >6.4         Glycemic control for adults with diabetes: <7.0     CBG: Recent Labs  Lab 09/19/20 1534 09/19/20 1932 09/19/20 2324 09/20/20 0345 09/20/20 0739  GLUCAP 254* 227* 95 228* 231*    App cct 23 min  Brett Canales Minor ACNP Acute Care Nurse Practitioner [facility] Pulmonary/Critical Care Please consult Amion 09/20/2020, 9:27 AM  Attending:    Subjective: No acute events Respiratory status unchanged Planning PEG on 8/9  Objective: Vitals:   09/20/20 0741 09/20/20 0808 09/20/20 0900 09/20/20 1000  BP:  (!) 142/76 (!) 157/93 (!) 142/75  Pulse:  76 79 71  Resp:  19 18 16   Temp: 97.7 F (36.5 C)     TempSrc: Oral     SpO2:  90% 90% 91%  Weight:      Height:          Intake/Output Summary (Last 24 hours) at 09/20/2020 1027 Last data filed at 09/20/2020 1000 Gross per 24 hour  Intake 1805.59 ml  Output 881 ml  Net 924.59 ml    General:  Resting comfortably in bed HENT: NCAT OP clear, cor trak in place PULM:no breath sounds left lung B, normal effort CV: RRR, no mgr GI: BS+, soft, nontender MSK: normal bulk and tone Neuro: awake, alert, no distress, MAEW    CBC    Component Value Date/Time   WBC 10.5 09/20/2020 0238   RBC 4.38 09/20/2020 0238   HGB 14.2 09/20/2020 0238   HCT 42.1  09/20/2020 0238   PLT 322 09/20/2020 0238   MCV 96.1 09/20/2020 0238   MCH 32.4 09/20/2020 0238   MCHC 33.7  09/20/2020 0238   RDW 13.0 09/20/2020 0238   LYMPHSABS 1.7 09/11/2020 0312   MONOABS 0.9 09/11/2020 0312   EOSABS 0.3 09/11/2020 0312   BASOSABS 0.1 09/11/2020 0312    BMET    Component Value Date/Time   NA 145 09/20/2020 0238   K 3.9 09/20/2020 0238   CL 111 09/20/2020 0238   CO2 24 09/20/2020 0238   GLUCOSE 206 (H) 09/20/2020 0238   BUN 19 09/20/2020 0238   CREATININE 0.87 09/20/2020 0238   CALCIUM 9.3 09/20/2020 0238   GFRNONAA >60 09/20/2020 0238   GFRAA >60 11/05/2019 0649    CXR images reviewed, left lower lobe infiltrate noted  Impression/Plan: Hyperglycemia/DM2> restaring levemir, continue SSI Dysphagia with recurrent aspiration pneumonia> complete NPO, tube feedins per cor-track to continue, plan PEG next week Acute respiratory failure with hypoxemia> continue high flow oxygen in ICU environement, continue efforts at pulmonary toilette, continue aspiration pneumonia treatment, keep NPO Acute stroke> aspirin, holding plavix COPD: continue brovana/pulmicort  My cc time 30 minutes  Heber [place], MD [facility] Pager: [phone] Cell: [phone] After 7pm: [phone]

## 2020-09-20 NOTE — Progress Notes (Signed)
Pharmacy Electrolyte Replacement  Recent Labs:  Recent Labs    09/18/20 0404 09/19/20 0223 09/20/20 0238  K 3.8   < > 3.9  MG 1.6*  --  1.6*  PHOS 3.3  --   --   CREATININE 0.85   < > 0.87   < > = values in this interval not displayed.    No critical value noted.   Plan: supplement 2g magnesium sulfate IV.   Jani Gravel, PharmD PGY-1 Acute Care Resident  09/20/2020 9:37 AM

## 2020-09-20 NOTE — Progress Notes (Signed)
Chart check note-   Noted plans for G- tube on 8/9. Being evaluated for CIR.  Palliative will follow from a distance with intermittent chart checks and see patient if needed.  Goals have been clearly established, no role for Palliative at this time. Please call if additional assistance is needed.   Ocie Bob, AGNP-C Palliative Medicine  No charge

## 2020-09-21 ENCOUNTER — Inpatient Hospital Stay (HOSPITAL_COMMUNITY): Payer: Medicaid Other

## 2020-09-21 DIAGNOSIS — J441 Chronic obstructive pulmonary disease with (acute) exacerbation: Secondary | ICD-10-CM | POA: Diagnosis not present

## 2020-09-21 DIAGNOSIS — J9621 Acute and chronic respiratory failure with hypoxia: Secondary | ICD-10-CM | POA: Diagnosis not present

## 2020-09-21 DIAGNOSIS — J9601 Acute respiratory failure with hypoxia: Secondary | ICD-10-CM | POA: Diagnosis not present

## 2020-09-21 LAB — BASIC METABOLIC PANEL
Anion gap: 8 (ref 5–15)
BUN: 18 mg/dL (ref 6–20)
CO2: 27 mmol/L (ref 22–32)
Calcium: 9.3 mg/dL (ref 8.9–10.3)
Chloride: 108 mmol/L (ref 98–111)
Creatinine, Ser: 0.85 mg/dL (ref 0.61–1.24)
GFR, Estimated: >60 mL/min (ref >60)
Glucose, Bld: 209 mg/dL — ABNORMAL HIGH (ref 70–99)
Potassium: 4.1 mmol/L (ref 3.5–5.1)
Sodium: 143 mmol/L (ref 135–145)

## 2020-09-21 LAB — GLUCOSE, CAPILLARY
Glucose-Capillary: 173 mg/dL — ABNORMAL HIGH (ref 70–99)
Glucose-Capillary: 215 mg/dL — ABNORMAL HIGH (ref 70–99)
Glucose-Capillary: 232 mg/dL — ABNORMAL HIGH (ref 70–99)
Glucose-Capillary: 277 mg/dL — ABNORMAL HIGH (ref 70–99)
Glucose-Capillary: 232 mg/dL — ABNORMAL HIGH (ref 70–99)
Glucose-Capillary: 132 mg/dL — ABNORMAL HIGH (ref 70–99)

## 2020-09-21 LAB — CBC
HCT: 41.6 % (ref 39.0–52.0)
Hemoglobin: 13.7 g/dL (ref 13.0–17.0)
MCH: 31.2 pg (ref 26.0–34.0)
MCHC: 32.9 g/dL (ref 30.0–36.0)
MCV: 94.8 fL (ref 80.0–100.0)
Platelets: 300 10*3/uL (ref 150–400)
RBC: 4.39 MIL/uL (ref 4.22–5.81)
RDW: 12.8 % (ref 11.5–15.5)
WBC: 13 10*3/uL — ABNORMAL HIGH (ref 4.0–10.5)
nRBC: 0 % (ref 0.0–0.2)

## 2020-09-21 LAB — PHOSPHORUS: Phosphorus: 3.3 mg/dL (ref 2.5–4.6)

## 2020-09-21 LAB — MAGNESIUM: Magnesium: 1.7 mg/dL (ref 1.7–2.4)

## 2020-09-21 IMAGING — DX DG CHEST 1V PORT
1 series · 1 of 1 positions shown · non-contrast
Comparison: [DATE] and prior studies

CLINICAL DATA: Abnormal respiration

EXAM:
PORTABLE CHEST 1 VIEW

[chest ap]
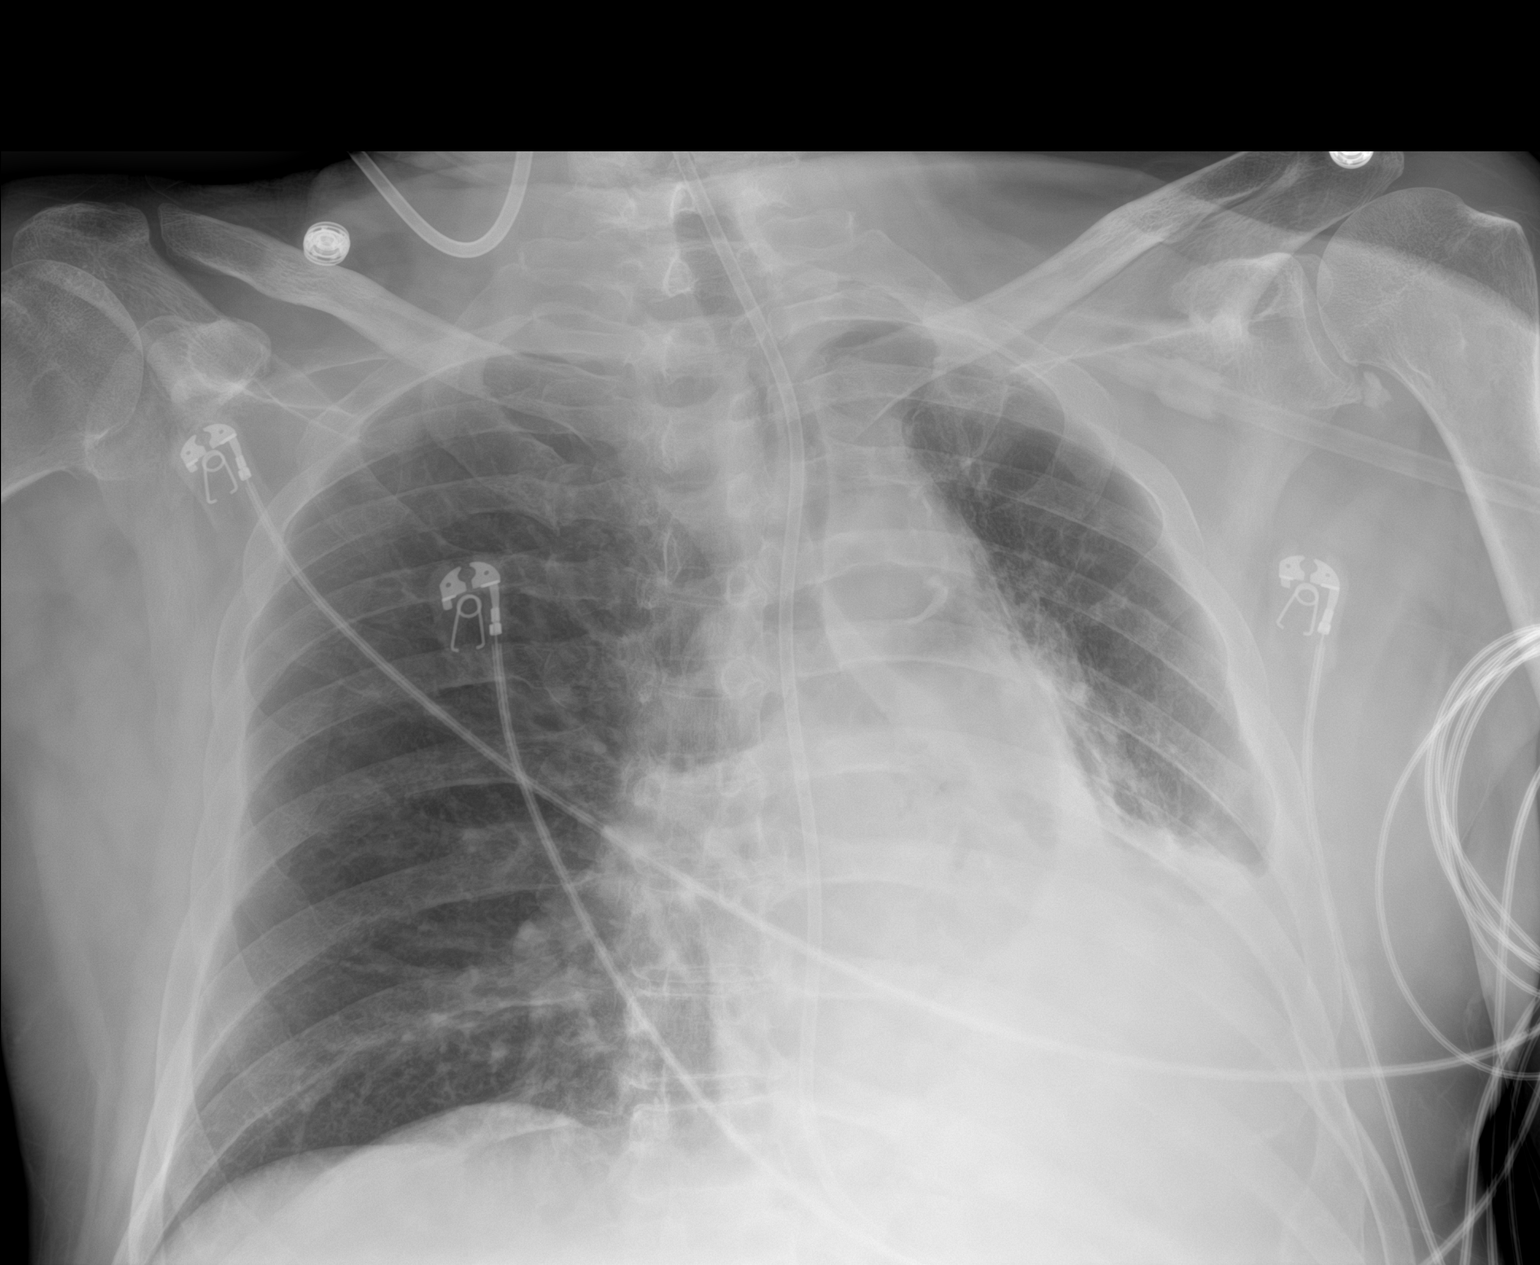

[1 of 1 positions shown; findings below may reference images not displayed]

FINDINGS: Cardiomediastinal silhouette is unchanged.

A small bore feeding tube is again identified entering the stomach
with tip off the field of view.

LEFT basilar opacity has slightly improved.

No other significant changes are noted.

No pneumothorax.
IMPRESSION: Slightly improved LEFT basilar consolidation/atelectasis. No other
significant change.

## 2020-09-21 MED ORDER — INSULIN DETEMIR 100 UNIT/ML ~~LOC~~ SOLN
30.0000 [IU] | Freq: Two times a day (BID) | SUBCUTANEOUS | Status: DC
  Administered 2020-09-21 – 2020-09-23 (×4): 30 [IU] via SUBCUTANEOUS
  Filled 2020-09-21 (×5): qty 0.3

## 2020-09-21 MED ORDER — SODIUM CHLORIDE 3 % IN NEBU
4.0000 mL | INHALATION_SOLUTION | Freq: Two times a day (BID) | RESPIRATORY_TRACT | Status: AC
  Administered 2020-09-21 – 2020-09-23 (×6): 4 mL via RESPIRATORY_TRACT
  Filled 2020-09-21 (×6): qty 4

## 2020-09-21 NOTE — Progress Notes (Signed)
CPT not done at this time due to patient visiting with his family.

## 2020-09-21 NOTE — Progress Notes (Signed)
NAME:  Brent [name], MRN:  [id number], DOB:  April 14, 1965, LOS: 10 ADMISSION DATE:  09/11/2020, CONSULTATION DATE:  7/31 REFERRING MD:  Tat, CHIEF COMPLAINT:  Dyspnea   History of Present Illness:  56 y/o male presented to [facility] with left sided weakness and facial droop on 7/27, then developed left lung white out on CXR from mucus plugging and atelectasis related to aspiration.  He was moved to [facility] where he has received chest PT, high flow O2, and antibiotics.   Pertinent  Medical History  Hypertension, hyperlipidemia, diabetes type 2, tobacco abuse.  Stroke .  With left hemiparesis.  Cocaine abuse.  OSA. GERD.  Prior seizure disorder.  Prior tracheostomy (remote), and not able to tolerate CPAP.  Significant Hospital Events: Including procedures, antibiotic start and stop dates in addition to other pertinent events   7/25 admitted with aspiration pneumonia and new stroke with worsening left-sided left hemiparesis and facial droop as well as slurred speech.  This was over a 2 to 3-day period of time, in regards to the weakness so was not a tPA or neuro IR candidate. He was admitted therapeutic interventions included antiplatelet therapy, for stroke.  He was placed on azithromycin and Zosyn. 7/28 increasing oxygen needs.  Barium swallow demonstrating aspiration.  Interventional radiology consulted for possible PEG. 7/29 ongoing increased oxygen needs.  Goals of care conversation with palliative care.  Patient wanting full code okay with PEG tube if needed 7/30 through 7/31: Increasing oxygen requirements, worsening left-sided atelectasis to the point of complete opacification of the left hemithorax on time of transfer to [facility] on 7/31.  Azithromycin discontinued. Plavix was stopped for her and pending PEG placement ninth 8/6 Weaned down to 8 liters nasal cannula  Interim History / Subjective:   Feeling stronger today  Breathing comfortably Remains on tube  feeding  Objective   Blood pressure 121/77, pulse 67, temperature 97.9 F (36.6 C), temperature source Oral, resp. rate 20, height 6' (1.829 m), weight 82.8 kg, SpO2 92 %.        Intake/Output Summary (Last 24 hours) at 09/21/2020 0734 Last data filed at 09/21/2020 0600 Gross per 24 hour  Intake 1079.49 ml  Output 1000 ml  Net 79.49 ml   Filed Weights   09/18/20 0440 09/20/20 0500 09/21/20 0500  Weight: 82.8 kg 82.8 kg 82.8 kg    Examination: General:  Resting comfortably in bed HENT: NCAT OP clear cor trak in place PULM: Poor air movement in left lung, normal effort CV: RRR, no mgr GI: BS+, soft, nontender MSK: normal bulk and tone Neuro: awake, alert, can move left arm, minimal movement left leg, right side strength normal   Resolved Hospital Problem list     Assessment & Plan:  Acute hypoxemic respiratory failure due to aspiration pneumonia/mucus plugging with complete collapse of left lung. Aspiration pneumonia Continue guaifenesin scheduled Continue chest PT Continue flutter valve Add hypertonic saline nebulized Continue O2 to maintain O2 saturation > 88% At this point his lung should open up eventually over the next several weeks.  No clear role for bronchoscopy for therapeutic benefit Strict NPO  Dysphagia Hold plavix Plan PEG on Tuesday  History of tobacco abuse with COPD exacerbation Continue brovana and pulmicort Prednisone 30mg > end on Monday Needs outpatient pulmonary follow up with PFT  OSA, AHI 7.8 > non-compliant at home Holding CPAP here Continue O2 continuously  Acute ischemic stroke in R posterior temporal occipital lobe with residual left hemiparalysis PT/OT efforts  Chronic back pain Ocntinue tegretol  Cocaine abuse prior to admission Counsel to quit  Hypertension monitor  Hyperlipidemia Crestor and fenofibrate  DM2 with hyperglycemia> poorly controlled Double levemir to 30 units bid Continue tube feeding coverage and  SSI  History of seizure disorder tegretol  Tobacco abuse Counsel to quit  Insomnia Melatonin 3mg   General: transfer to floor, TRH service.  PCCM available PRN  Best Practice (right click and "Reselect all SmartList Selections" daily)   Diet/type: tubefeeds DVT prophylaxis: LMWH GI prophylaxis: H2B Lines: N/A Foley:  N/A Code Status:  full code Last date of multidisciplinary goals of care discussion [7/28]  Labs   CBC: Recent Labs  Lab 09/17/20 0504 09/18/20 0404 09/19/20 0223 09/20/20 0238 09/21/20 0346  WBC 17.2* 13.4* 10.8* 10.5 13.0*  HGB 14.0 13.5 13.7 14.2 13.7  HCT 43.6 42.1 41.1 42.1 41.6  MCV 96.9 97.9 96.0 96.1 94.8  PLT 375 355 343 322 300    Basic Metabolic Panel: Recent Labs  Lab 09/16/20 1632 09/17/20 0504 09/17/20 1652 09/18/20 0404 09/19/20 0223 09/20/20 0238 09/21/20 0346  NA  --  144  --  148* 143 145 143  K  --  3.9  --  3.8 3.7 3.9 4.1  CL  --  107  --  110 107 111 108  CO2  --  25  --  25 27 24 27   GLUCOSE  --  226*  --  126* 197* 206* 209*  BUN  --  26*  --  21* 21* 19 18  CREATININE  --  0.88  --  0.85 0.88 0.87 0.85  CALCIUM  --  9.7  --  9.7 9.4 9.3 9.3  MG 2.3 1.9 1.7 1.6*  --  1.6* 1.7  PHOS 2.1* 2.6 2.2* 3.3  --   --  3.3   GFR: Estimated Creatinine Clearance: 107.8 mL/min (by C-G formula based on SCr of 0.85 mg/dL). Recent Labs  Lab 09/18/20 0404 09/19/20 0223 09/20/20 0238 09/21/20 0346  WBC 13.4* 10.8* 10.5 13.0*    Liver Function Tests: No results for input(s): AST, ALT, ALKPHOS, BILITOT, PROT, ALBUMIN in the last 168 hours. No results for input(s): LIPASE, AMYLASE in the last 168 hours. No results for input(s): AMMONIA in the last 168 hours.  ABG    Component Value Date/Time   PHART 7.432 09/14/2020 1812   PCO2ART 44.0 09/14/2020 1812   PO2ART 43.4 (L) 09/14/2020 1812   HCO3 27.8 09/14/2020 1812   O2SAT 79.7 09/14/2020 1812     Coagulation Profile: No results for input(s): INR, PROTIME in the last  168 hours.  Cardiac Enzymes: No results for input(s): CKTOTAL, CKMB, CKMBINDEX, TROPONINI in the last 168 hours.  HbA1C: Hgb A1c MFr Bld  Date/Time Value Ref Range Status  09/12/2020 04:10 AM 6.4 (H) 4.8 - 5.6 % Final    Comment:    (NOTE)         Prediabetes: 5.7 - 6.4         Diabetes: >6.4         Glycemic control for adults with diabetes: <7.0   07/26/2020 05:59 AM 7.0 (H) 4.8 - 5.6 % Final    Comment:    (NOTE)         Prediabetes: 5.7 - 6.4         Diabetes: >6.4         Glycemic control for adults with diabetes: <7.0     CBG: Recent Labs  Lab 09/20/20 1533  09/20/20 1925 09/20/20 2336 09/21/20 0331 09/21/20 0720  GLUCAP 280* 244* 235* 173* 215*    Critical care time: n/a    Heber Iberia, MD [facility] Pager: [phone] Cell: [phone] After 7:00 pm call Elink  [phone]

## 2020-09-22 ENCOUNTER — Encounter (HOSPITAL_COMMUNITY): Payer: Self-pay | Admitting: Internal Medicine

## 2020-09-22 DIAGNOSIS — J441 Chronic obstructive pulmonary disease with (acute) exacerbation: Secondary | ICD-10-CM | POA: Diagnosis not present

## 2020-09-22 DIAGNOSIS — J9601 Acute respiratory failure with hypoxia: Secondary | ICD-10-CM | POA: Diagnosis not present

## 2020-09-22 DIAGNOSIS — J690 Pneumonitis due to inhalation of food and vomit: Secondary | ICD-10-CM | POA: Diagnosis not present

## 2020-09-22 DIAGNOSIS — F1410 Cocaine abuse, uncomplicated: Secondary | ICD-10-CM | POA: Diagnosis not present

## 2020-09-22 LAB — GLUCOSE, CAPILLARY
Glucose-Capillary: 132 mg/dL — ABNORMAL HIGH (ref 70–99)
Glucose-Capillary: 174 mg/dL — ABNORMAL HIGH (ref 70–99)
Glucose-Capillary: 206 mg/dL — ABNORMAL HIGH (ref 70–99)
Glucose-Capillary: 216 mg/dL — ABNORMAL HIGH (ref 70–99)
Glucose-Capillary: 135 mg/dL — ABNORMAL HIGH (ref 70–99)
Glucose-Capillary: 129 mg/dL — ABNORMAL HIGH (ref 70–99)

## 2020-09-22 MED ORDER — ZINC OXIDE 40 % EX OINT
TOPICAL_OINTMENT | Freq: Every day | CUTANEOUS | Status: DC | PRN
  Filled 2020-09-22: qty 57

## 2020-09-22 NOTE — Progress Notes (Signed)
Progress Note    Brent Driversimothy C Moffatt  ZOX:[id number] DOB: 02-Apr-1965  DOA: 09/11/2020 PCP: Beatrix FettersKotturi, Vinay K, MD      Brief Narrative:    Medical records reviewed and are as summarized below:  Brent [name] is a 55 y.o. male with past medical history significant for hypertension, type 2 diabetes mellitus, hyperlipidemia, cocaine abuse, tobacco use disorder, seizure disorder, OSA, GERD, stroke, who presented to [facility] with shortness of breath, cough, slurred speech, left-sided weakness and facial droop on 09/11/2020.  He was found to have sepsis secondary to aspiration pneumonia, COPD exacerbation, acute hypoxic respiratory failure and acute stroke.  He was transferred to [facility] for further management.    Assessment/Plan:   Active Problems:   Hypertriglyceridemia   Tobacco abuse   COPD with acute exacerbation (HCC)   Cocaine abuse (HCC)   CVA (cerebral vascular accident) (HCC)   Acute respiratory failure with hypoxia (HCC)   Aspiration pneumonitis (HCC)   Dysarthria   Sepsis due to undetermined organism (HCC)   Goals of care, counseling/discussion   Nutrition Problem: Inadequate oral intake Etiology: dysphagia (acute ischemic/embolic stroke)  Signs/Symptoms: NPO status (SLP recommending NPO with alternate feeding source. Hx of aspiration PNA and previous strokes)   Body mass index is 24.76 kg/m.   Sepsis secondary to aspiration pneumonia: Left basilar atelectasis: Continue IV antibiotics  Acute hypoxic respiratory failure: He is requiring 8 L/min oxygen via high flow nasal cannula.  Taper down oxygen as able.  Acute stroke with left hemiparesis: Continue aspirin.  Plavix has been held for upcoming PEG tube placement.  Continue PT and OT.  Dysphagia: Plan for PEG tube placement on 09/24/2020.  Plavix on hold for this.  COPD exacerbation: Continue bronchodilators and prednisone.  Outpatient follow-up with pulmonologist for  PFTs.   Other comorbidities include hypertension, hyperlipidemia, type II DM, seizure disorder, chronic back pain, cocaine abuse, tobacco use disorder  Case was discussed with intensivist, Dr. Kendrick FriesMcQuaid.   Diet Order             Diet NPO time specified  Diet effective midnight                      Consultants: Intensivist  Procedures: Cortrak feeding tube placement on 09/16/2020    Medications:    arformoterol  15 mcg Nebulization BID   aspirin  81 mg Per Tube Daily   budesonide (PULMICORT) nebulizer solution  0.5 mg Nebulization BID   carBAMazepine  200 mg Per Tube Q6H   chlorhexidine  15 mL Mouth Rinse BID   Chlorhexidine Gluconate Cloth  6 each Topical Q2200   [START ON 09/25/2020] enoxaparin (LOVENOX) injection  40 mg Subcutaneous Q24H   feeding supplement (PROSource TF)  45 mL Per Tube Daily   fenofibrate  160 mg Per Tube Daily   guaiFENesin  10 mL Per Tube Q4H   insulin aspart  0-20 Units Subcutaneous Q4H   insulin aspart  3 Units Subcutaneous Q4H   insulin detemir  30 Units Subcutaneous BID   ipratropium-albuterol  3 mL Nebulization Q4H   mouth rinse  15 mL Mouth Rinse q12n4p   polyethylene glycol  17 g Per Tube Daily   predniSONE  30 mg Per Tube Q breakfast   rosuvastatin  20 mg Per Tube Daily   sodium chloride HYPERTONIC  4 mL Nebulization BID   Continuous Infusions:  sodium chloride Stopped (09/19/20 0355)   sodium chloride Stopped (09/22/20  8453)   feeding supplement (OSMOLITE 1.5 CAL) 1,000 mL (09/21/20 2129)   piperacillin-tazobactam (ZOSYN)  IV 12.5 mL/hr at 09/22/20 1000     Anti-infectives (From admission, onward)    Start     Dose/Rate Route Frequency Ordered Stop   09/18/20 0500  vancomycin (VANCOREADY) IVPB 1250 mg/250 mL  Status:  Discontinued        1,250 mg 166.7 mL/hr over 90 Minutes Intravenous Every 12 hours 09/17/20 1604 09/18/20 1304   09/18/20 0000  ceFAZolin (ANCEF) IVPB 2g/100 mL premix  Status:  Discontinued        2 g 200  mL/hr over 30 Minutes Intravenous To Radiology 09/17/20 1017 09/17/20 1021   09/17/20 1700  vancomycin (VANCOREADY) IVPB 1750 mg/350 mL        1,750 mg 175 mL/hr over 120 Minutes Intravenous  Once 09/17/20 1601 09/17/20 1907   09/11/20 1600  piperacillin-tazobactam (ZOSYN) IVPB 3.375 g        3.375 g 12.5 mL/hr over 240 Minutes Intravenous Every 8 hours 09/11/20 0740 09/24/20 2359   09/11/20 0815  azithromycin (ZITHROMAX) 500 mg in sodium chloride 0.9 % 250 mL IVPB  Status:  Discontinued        500 mg 250 mL/hr over 60 Minutes Intravenous Every 24 hours 09/11/20 0811 09/15/20 1420   09/11/20 0815  piperacillin-tazobactam (ZOSYN) IVPB 3.375 g        3.375 g 100 mL/hr over 30 Minutes Intravenous  Once 09/11/20 0815 09/11/20 1218              Family Communication/Anticipated D/C date and plan/Code Status   DVT prophylaxis: enoxaparin (LOVENOX) injection 40 mg Start: 09/25/20 1045     Code Status: Full Code  Family Communication: None Disposition Plan:    Status is: Inpatient  Remains inpatient appropriate because:Inpatient level of care appropriate due to severity of illness  Dispo: The patient is from: Home              Anticipated d/c is to: [facility]              Patient currently is not medically stable to d/c.   Difficult to place patient No           Subjective:   Interval events noted.  No shortness of breath or chest pain.  Objective:    Vitals:   09/22/20 0800 09/22/20 0900 09/22/20 0930 09/22/20 1000  BP: (!) 142/91 (!) 149/80  (!) 144/78  Pulse: 70 89 81 74  Resp: 13 14 18 19   Temp:      TempSrc:      SpO2: 94% (!) 89% (!) 87% 90%  Weight:      Height:       No data found.   Intake/Output Summary (Last 24 hours) at 09/22/2020 1149 Last data filed at 09/22/2020 1000 Gross per 24 hour  Intake 1623.66 ml  Output 1525 ml  Net 98.66 ml   Filed Weights   09/20/20 0500 09/21/20 0500 09/22/20 0500  Weight: 82.8 kg 82.8 kg 82.8 kg     Exam:  GEN: NAD SKIN: Warm and dry EYES: EOMI ENT: MMM, NG tube in place CV: RRR PULM: CTA B ABD: soft, ND, NT, +BS CNS: AAO x 2 (person and place), power in LUE -3/5, LLE- 1/5 EXT: No edema or tenderness        Data Reviewed:   I have personally reviewed following labs and imaging studies:  Labs: Labs show the following:  Basic Metabolic Panel: Recent Labs  Lab 09/16/20 1632 09/17/20 0504 09/17/20 1652 09/18/20 0404 09/19/20 0223 09/20/20 0238 09/21/20 0346  NA  --  144  --  148* 143 145 143  K  --  3.9  --  3.8 3.7 3.9 4.1  CL  --  107  --  110 107 111 108  CO2  --  25  --  25 27 24 27   GLUCOSE  --  226*  --  126* 197* 206* 209*  BUN  --  26*  --  21* 21* 19 18  CREATININE  --  0.88  --  0.85 0.88 0.87 0.85  CALCIUM  --  9.7  --  9.7 9.4 9.3 9.3  MG 2.3 1.9 1.7 1.6*  --  1.6* 1.7  PHOS 2.1* 2.6 2.2* 3.3  --   --  3.3   GFR Estimated Creatinine Clearance: 107.8 mL/min (by C-G formula based on SCr of 0.85 mg/dL). Liver Function Tests: No results for input(s): AST, ALT, ALKPHOS, BILITOT, PROT, ALBUMIN in the last 168 hours. No results for input(s): LIPASE, AMYLASE in the last 168 hours. No results for input(s): AMMONIA in the last 168 hours. Coagulation profile No results for input(s): INR, PROTIME in the last 168 hours.  CBC: Recent Labs  Lab 09/17/20 0504 09/18/20 0404 09/19/20 0223 09/20/20 0238 09/21/20 0346  WBC 17.2* 13.4* 10.8* 10.5 13.0*  HGB 14.0 13.5 13.7 14.2 13.7  HCT 43.6 42.1 41.1 42.1 41.6  MCV 96.9 97.9 96.0 96.1 94.8  PLT 375 355 343 322 300   Cardiac Enzymes: No results for input(s): CKTOTAL, CKMB, CKMBINDEX, TROPONINI in the last 168 hours. BNP (last 3 results) No results for input(s): PROBNP in the last 8760 hours. CBG: Recent Labs  Lab 09/21/20 1915 09/21/20 2325 09/22/20 0337 09/22/20 0731 09/22/20 1138  GLUCAP 232* 132* 132* 174* 206*   D-Dimer: No results for input(s): DDIMER in the last 72 hours. Hgb  A1c: No results for input(s): HGBA1C in the last 72 hours. Lipid Profile: No results for input(s): CHOL, HDL, LDLCALC, TRIG, CHOLHDL, LDLDIRECT in the last 72 hours. Thyroid function studies: No results for input(s): TSH, T4TOTAL, T3FREE, THYROIDAB in the last 72 hours.  Invalid input(s): FREET3 Anemia work up: No results for input(s): VITAMINB12, FOLATE, FERRITIN, TIBC, IRON, RETICCTPCT in the last 72 hours. Sepsis Labs: Recent Labs  Lab 09/18/20 0404 09/19/20 0223 09/20/20 0238 09/21/20 0346  WBC 13.4* 10.8* 10.5 13.0*    Microbiology Recent Results (from the past 240 hour(s))  Culture, Respiratory w Gram Stain     Status: None   Collection Time: 09/17/20  8:27 AM   Specimen: Tracheal Aspirate; Respiratory  Result Value Ref Range Status   Specimen Description TRACHEAL ASPIRATE  Final   Special Requests NONE  Final   Gram Stain   Final    NO WBC SEEN ABUNDANT SQUAMOUS EPITHELIAL CELLS PRESENT RARE GRAM POSITIVE COCCI IN PAIRS    Culture   Final    FEW Normal respiratory flora-no Staph aureus or Pseudomonas seen Performed at [facility], [address], [place], [place]    Report Status 09/19/2020 FINAL  Final  MRSA Next Gen by PCR, Nasal     Status: None   Collection Time: 09/17/20  4:00 PM   Specimen: Nasal Mucosa; Nasal Swab  Result Value Ref Range Status   MRSA by PCR Next Gen NOT DETECTED NOT DETECTED Final    Comment: (NOTE) The GeneXpert MRSA  Assay (FDA approved for NASAL specimens only), is one component of a comprehensive MRSA colonization surveillance program. It is not intended to diagnose MRSA infection nor to guide or monitor treatment for MRSA infections. Test performance is not FDA approved in patients less than 44 years old. Performed at [facility], [address], [place], [place] [address]     Procedures and diagnostic studies:  DG Chest Port 1 View  Result Date: 09/21/2020 CLINICAL DATA:  Abnormal respiration EXAM:  PORTABLE CHEST 1 VIEW COMPARISON:  09/20/2020 and prior studies FINDINGS: Cardiomediastinal silhouette is unchanged. A small bore feeding tube is again identified entering the stomach with tip off the field of view. LEFT basilar opacity has slightly improved. No other significant changes are noted. No pneumothorax. IMPRESSION: Slightly improved LEFT basilar consolidation/atelectasis. No other significant change. Electronically Signed   By: Harmon Pier M.D.   On: 09/21/2020 08:38               LOS: 11 days   Rosea Dory  [facility]   Pager on [email or web address]. If 7PM-7AM, please contact night-coverage at [email or web address]     09/22/2020, 11:49 AM

## 2020-09-23 ENCOUNTER — Inpatient Hospital Stay (HOSPITAL_COMMUNITY): Payer: Medicaid Other

## 2020-09-23 DIAGNOSIS — F1410 Cocaine abuse, uncomplicated: Secondary | ICD-10-CM | POA: Diagnosis not present

## 2020-09-23 DIAGNOSIS — J690 Pneumonitis due to inhalation of food and vomit: Secondary | ICD-10-CM | POA: Diagnosis not present

## 2020-09-23 DIAGNOSIS — J9601 Acute respiratory failure with hypoxia: Secondary | ICD-10-CM | POA: Diagnosis not present

## 2020-09-23 DIAGNOSIS — I639 Cerebral infarction, unspecified: Secondary | ICD-10-CM | POA: Diagnosis not present

## 2020-09-23 LAB — GLUCOSE, CAPILLARY
Glucose-Capillary: 131 mg/dL — ABNORMAL HIGH (ref 70–99)
Glucose-Capillary: 162 mg/dL — ABNORMAL HIGH (ref 70–99)
Glucose-Capillary: 170 mg/dL — ABNORMAL HIGH (ref 70–99)
Glucose-Capillary: 188 mg/dL — ABNORMAL HIGH (ref 70–99)
Glucose-Capillary: 193 mg/dL — ABNORMAL HIGH (ref 70–99)
Glucose-Capillary: 213 mg/dL — ABNORMAL HIGH (ref 70–99)
Glucose-Capillary: 228 mg/dL — ABNORMAL HIGH (ref 70–99)

## 2020-09-23 LAB — PROTIME-INR
INR: 1 (ref 0.8–1.2)
Prothrombin Time: 12.9 seconds (ref 11.4–15.2)

## 2020-09-23 LAB — BASIC METABOLIC PANEL
Anion gap: 10 (ref 5–15)
BUN: 23 mg/dL — ABNORMAL HIGH (ref 6–20)
CO2: 24 mmol/L (ref 22–32)
Calcium: 9.5 mg/dL (ref 8.9–10.3)
Chloride: 105 mmol/L (ref 98–111)
Creatinine, Ser: 0.97 mg/dL (ref 0.61–1.24)
GFR, Estimated: >60 mL/min (ref >60)
Glucose, Bld: 214 mg/dL — ABNORMAL HIGH (ref 70–99)
Potassium: 4 mmol/L (ref 3.5–5.1)
Sodium: 139 mmol/L (ref 135–145)

## 2020-09-23 LAB — BLOOD GAS, VENOUS
Acid-Base Excess: 2.7 mmol/L — ABNORMAL HIGH (ref 0.0–2.0)
Bicarbonate: 26.8 mmol/L (ref 20.0–28.0)
O2 Saturation: 92.3 %
Patient temperature: 37
pCO2, Ven: 42.4 mmHg — ABNORMAL LOW (ref 44.0–60.0)
pH, Ven: 7.417 (ref 7.250–7.430)
pO2, Ven: 67.3 mmHg — ABNORMAL HIGH (ref 32.0–45.0)

## 2020-09-23 IMAGING — DX DG CHEST 1V PORT
1 series · 1 of 1 positions shown · non-contrast
Comparison: [DATE], CT [DATE], radiograph [DATE]

CLINICAL DATA: Hypoxia

EXAM:
PORTABLE CHEST 1 VIEW

[chest ap]
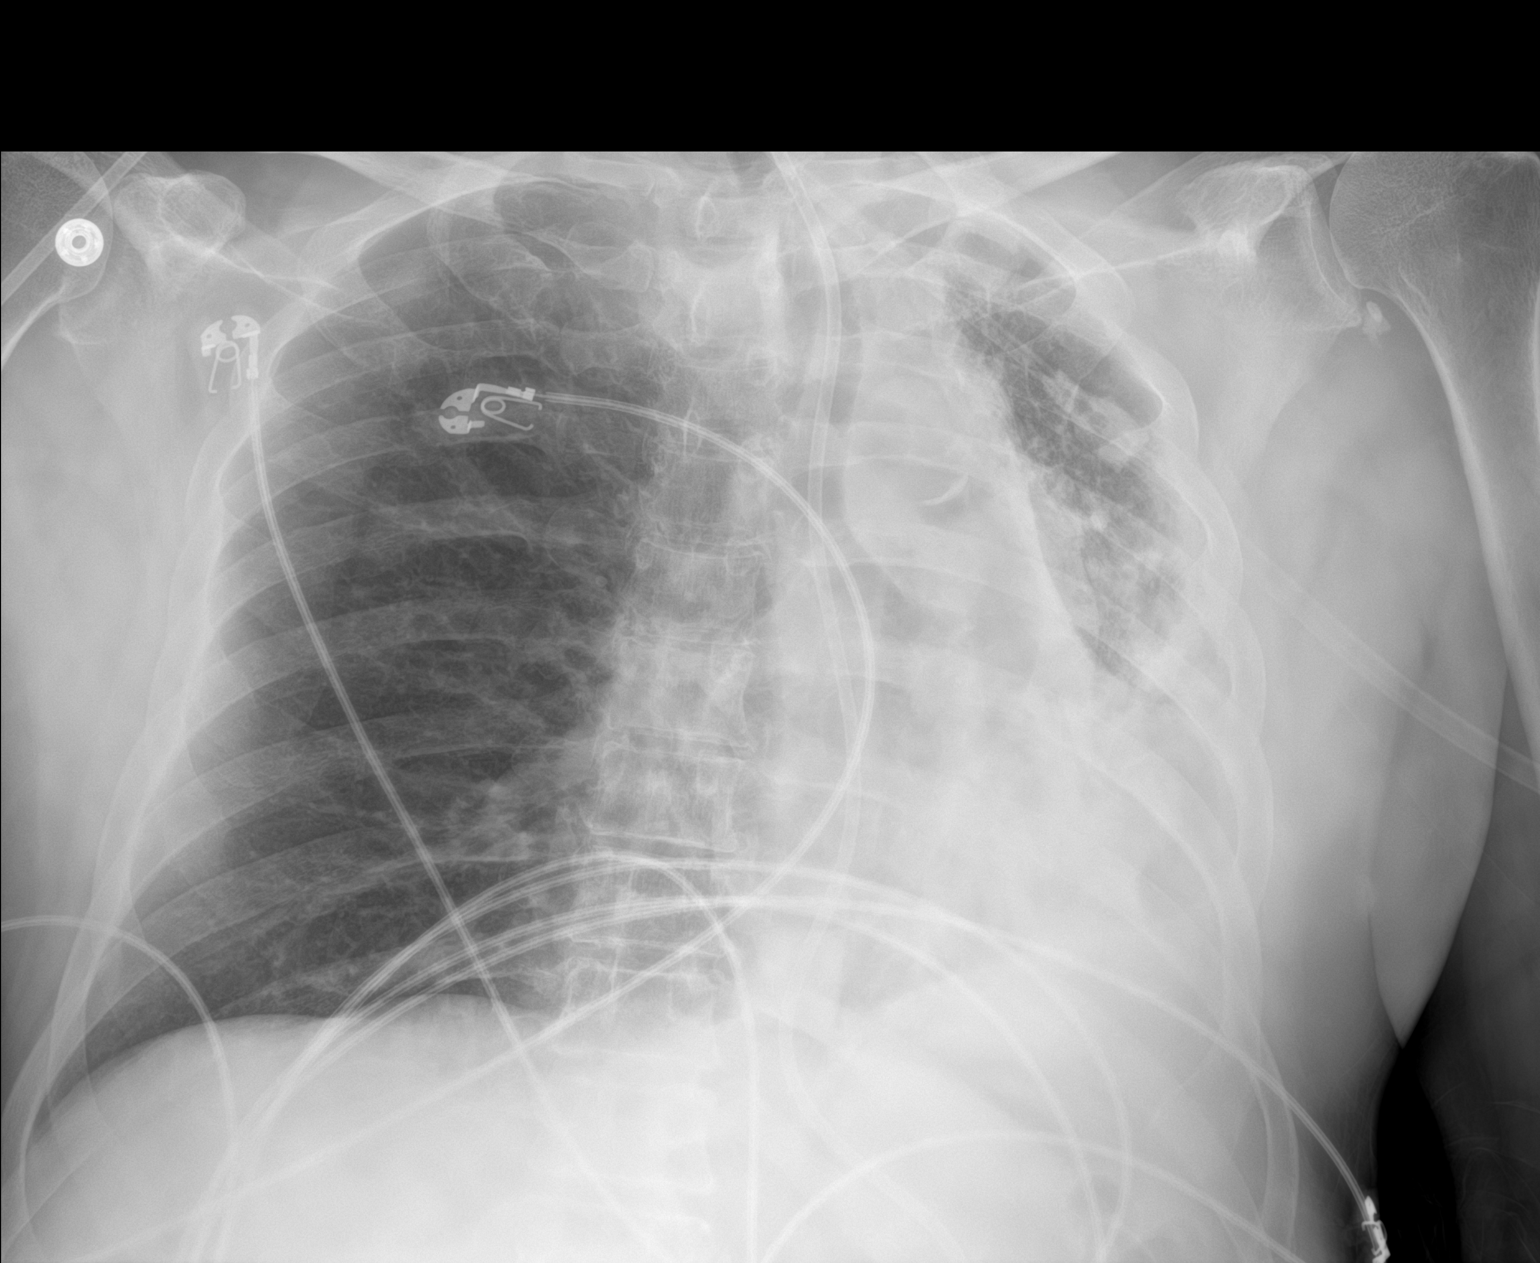

[1 of 1 positions shown; findings below may reference images not displayed]

FINDINGS: Esophageal tube tip below the diaphragm but incompletely included.
Right lung grossly clear. Volume loss with shift of mediastinal
contents to the left. Small left-sided pleural effusion. Slightly
improved aeration at left lung base. Stable cardiomediastinal
silhouette with aortic atherosclerosis
IMPRESSION: 1. Slightly improved aeration at left lung base with residual small
left effusion and airspace disease. Persistent volume loss with
shift of mediastinal contents to the left, central obstructing
process is possible and chest CT follow-up should be considered.

## 2020-09-23 MED ORDER — CEFAZOLIN SODIUM-DEXTROSE 2-4 GM/100ML-% IV SOLN
2.0000 g | INTRAVENOUS | Status: AC
  Filled 2020-09-23: qty 100

## 2020-09-23 MED ORDER — SCOPOLAMINE 1 MG/3DAYS TD PT72
1.0000 | MEDICATED_PATCH | TRANSDERMAL | Status: DC
  Filled 2020-09-23: qty 1

## 2020-09-23 MED ORDER — INSULIN DETEMIR 100 UNIT/ML ~~LOC~~ SOLN
30.0000 [IU] | Freq: Two times a day (BID) | SUBCUTANEOUS | Status: DC
  Administered 2020-09-24 – 2020-09-27 (×5): 30 [IU] via SUBCUTANEOUS
  Filled 2020-09-23 (×7): qty 0.3

## 2020-09-23 MED ORDER — SODIUM CHLORIDE 3 % IN NEBU
4.0000 mL | INHALATION_SOLUTION | RESPIRATORY_TRACT | Status: AC | PRN
  Administered 2020-09-23: 4 mL via RESPIRATORY_TRACT
  Filled 2020-09-23: qty 4

## 2020-09-23 MED ORDER — ENOXAPARIN SODIUM 40 MG/0.4ML IJ SOSY
40.0000 mg | PREFILLED_SYRINGE | INTRAMUSCULAR | Status: DC
  Administered 2020-09-24 – 2020-09-25 (×2): 40 mg via SUBCUTANEOUS
  Filled 2020-09-23 (×2): qty 0.4

## 2020-09-23 NOTE — Progress Notes (Addendum)
Progress Note    Read Driversimothy C Shelley  ZOX:[id number] DOB: 1965-10-23  DOA: 09/11/2020 PCP: Beatrix FettersKotturi, Vinay K, MD      Brief Narrative:    Medical records reviewed and are as summarized below:  Read Driversimothy C Bones is a 55 y.o. male with past medical history significant for hypertension, type 2 diabetes mellitus, hyperlipidemia, cocaine abuse, tobacco use disorder, seizure disorder, OSA, GERD, stroke, who presented to [facility] with shortness of breath, cough, slurred speech, left-sided weakness and facial droop on 09/11/2020.  He was found to have sepsis secondary to aspiration pneumonia, COPD exacerbation, acute hypoxic respiratory failure and acute stroke.  He was [facility] for [facility] management.    Assessment/Plan:   Active Problems:   Hypertriglyceridemia   Tobacco abuse   COPD with acute exacerbation (HCC)   Cocaine abuse (HCC)   CVA (cerebral vascular accident) (HCC)   Acute respiratory failure with hypoxia (HCC)   Aspiration pneumonitis (HCC)   Dysarthria   Sepsis due to undetermined organism (HCC)   Goals of care, counseling/discussion   Nutrition Problem: Inadequate oral intake Etiology: dysphagia (acute ischemic/embolic stroke)  Signs/Symptoms: NPO status (SLP recommending NPO with alternate feeding source. Hx of aspiration PNA and previous strokes)   Body mass index is 24.64 kg/m.   Sepsis secondary to aspiration pneumonia: CT chest obtained today showed multifocal pneumonia in the left lung.  Continue IV antibiotics  Acute hypoxic respiratory failure: Worsened.  He required 15 L/min oxygen via HFNC this morning.  Oxygen needs have been escalated to oxygenation via heated humidified high flow nasal cannula.  Taper down oxygen as able.    Acute stroke with left hemiparesis: Continue aspirin.  Plavix has been held for upcoming PEG tube placement.  Continue PT and OT.  Dysphagia: Plan for PEG tube placement tomorrow by IR.   Plavix on hold for this.  COPD exacerbation: Continue bronchodilators and prednisone.  Outpatient follow-up with pulmonologist for PFTs.  Type II DM: Hold Levemir because he will be n.p.o. after midnight.    Other comorbidities include hypertension, hyperlipidemia, seizure disorder, chronic back pain, cocaine abuse, tobacco use disorder     Diet Order             Diet NPO time specified  Diet effective midnight                      Consultants: Intensivist  Procedures: Cortrak feeding tube placement on 09/16/2020    Medications:    arformoterol  15 mcg Nebulization BID   aspirin  81 mg Per Tube Daily   budesonide (PULMICORT) nebulizer solution  0.5 mg Nebulization BID   carBAMazepine  200 mg Per Tube Q6H   chlorhexidine  15 mL Mouth Rinse BID   Chlorhexidine Gluconate Cloth  6 each Topical Q2200   [START ON 09/24/2020] enoxaparin (LOVENOX) injection  40 mg Subcutaneous Q24H   feeding supplement (PROSource TF)  45 mL Per Tube Daily   fenofibrate  160 mg Per Tube Daily   guaiFENesin  10 mL Per Tube Q4H   insulin aspart  0-20 Units Subcutaneous Q4H   insulin aspart  3 Units Subcutaneous Q4H   [START ON 09/24/2020] insulin detemir  30 Units Subcutaneous BID   ipratropium-albuterol  3 mL Nebulization Q4H   mouth rinse  15 mL Mouth Rinse q12n4p   polyethylene glycol  17 g Per Tube Daily   rosuvastatin  20 mg Per Tube Daily  sodium chloride HYPERTONIC  4 mL Nebulization BID   Continuous Infusions:  sodium chloride Stopped (09/19/20 0355)   sodium chloride Stopped (09/23/20 0326)   feeding supplement (OSMOLITE 1.5 CAL) 1,000 mL (09/22/20 1725)   piperacillin-tazobactam (ZOSYN)  IV 3.375 g (09/23/20 0959)     Anti-infectives (From admission, onward)    Start     Dose/Rate Route Frequency Ordered Stop   09/18/20 0500  vancomycin (VANCOREADY) IVPB 1250 mg/250 mL  Status:  Discontinued        1,250 mg 166.7 mL/hr over 90 Minutes Intravenous Every 12 hours 09/17/20  1604 09/18/20 1304   09/18/20 0000  ceFAZolin (ANCEF) IVPB 2g/100 mL premix  Status:  Discontinued        2 g 200 mL/hr over 30 Minutes Intravenous To Radiology 09/17/20 1017 09/17/20 1021   09/17/20 1700  vancomycin (VANCOREADY) IVPB 1750 mg/350 mL        1,750 mg 175 mL/hr over 120 Minutes Intravenous  Once 09/17/20 1601 09/17/20 1907   09/11/20 1600  piperacillin-tazobactam (ZOSYN) IVPB 3.375 g        3.375 g 12.5 mL/hr over 240 Minutes Intravenous Every 8 hours 09/11/20 0740 09/24/20 2359   09/11/20 0815  azithromycin (ZITHROMAX) 500 mg in sodium chloride 0.9 % 250 mL IVPB  Status:  Discontinued        500 mg 250 mL/hr over 60 Minutes Intravenous Every 24 hours 09/11/20 0811 09/15/20 1420   09/11/20 0815  piperacillin-tazobactam (ZOSYN) IVPB 3.375 g        3.375 g 100 mL/hr over 30 Minutes Intravenous  Once 09/11/20 0815 09/11/20 1218              Family Communication/Anticipated D/C date and plan/Code Status   DVT prophylaxis: enoxaparin (LOVENOX) injection 40 mg Start: 09/24/20 2200     Code Status: Full Code  Family Communication: None Disposition Plan:    Status is: Inpatient  Remains inpatient appropriate because:Inpatient level of care appropriate due to severity of illness  Dispo: The patient is from: Home              Anticipated d/c is to: CIR              Patient currently is not medically stable to d/c.   Difficult to place patient No           Subjective:   Interval events noted.  He has no complaints.  He is unable to provide an adequate history.  Objective:    Vitals:   09/23/20 1239 09/23/20 1300 09/23/20 1400 09/23/20 1405  BP:  131/85 133/84   Pulse: 77  80 80  Resp:   18 20  Temp:      TempSrc:      SpO2: 93%  92% 90%  Weight:      Height:       No data found.   Intake/Output Summary (Last 24 hours) at 09/23/2020 1508 Last data filed at 09/23/2020 1400 Gross per 24 hour  Intake 1439.73 ml  Output 1650 ml  Net -210.27  ml   Filed Weights   09/21/20 0500 09/22/20 0500 09/23/20 0500  Weight: 82.8 kg 82.8 kg 82.4 kg    Exam:  GEN: NAD SKIN: No rash EYES: EOMI ENT: MMM CV: RRR PULM: CTA B ABD: soft, ND, NT, +BS CNS: AAO x 2 (person and place), power in LUE- 3/5, LLE- 1/5 EXT: No edema or tenderness      Data Reviewed:  I have personally reviewed following labs and imaging studies:  Labs: Labs show the following:   Basic Metabolic Panel: Recent Labs  Lab 09/16/20 1632 09/16/20 1632 09/17/20 0504 09/17/20 1652 09/18/20 0404 09/19/20 0223 09/20/20 0238 09/21/20 0346 09/23/20 0332  NA  --    < > 144  --  148* 143 145 143 139  K  --    < > 3.9  --  3.8 3.7 3.9 4.1 4.0  CL  --    < > 107  --  110 107 111 108 105  CO2  --    < > 25  --  25 27 24 27 24   GLUCOSE  --    < > 226*  --  126* 197* 206* 209* 214*  BUN  --    < > 26*  --  21* 21* 19 18 23*  CREATININE  --    < > 0.88  --  0.85 0.88 0.87 0.85 0.97  CALCIUM  --    < > 9.7  --  9.7 9.4 9.3 9.3 9.5  MG 2.3  --  1.9 1.7 1.6*  --  1.6* 1.7  --   PHOS 2.1*  --  2.6 2.2* 3.3  --   --  3.3  --    < > = values in this interval not displayed.   GFR Estimated Creatinine Clearance: 94.4 mL/min (by C-G formula based on SCr of 0.97 mg/dL). Liver Function Tests: No results for input(s): AST, ALT, ALKPHOS, BILITOT, PROT, ALBUMIN in the last 168 hours. No results for input(s): LIPASE, AMYLASE in the last 168 hours. No results for input(s): AMMONIA in the last 168 hours. Coagulation profile Recent Labs  Lab 09/23/20 1346  INR 1.0    CBC: Recent Labs  Lab 09/17/20 0504 09/18/20 0404 09/19/20 0223 09/20/20 0238 09/21/20 0346  WBC 17.2* 13.4* 10.8* 10.5 13.0*  HGB 14.0 13.5 13.7 14.2 13.7  HCT 43.6 42.1 41.1 42.1 41.6  MCV 96.9 97.9 96.0 96.1 94.8  PLT 375 355 343 322 300   Cardiac Enzymes: No results for input(s): CKTOTAL, CKMB, CKMBINDEX, TROPONINI in the last 168 hours. BNP (last 3 results) No results for input(s):  PROBNP in the last 8760 hours. CBG: Recent Labs  Lab 09/22/20 2316 09/23/20 0208 09/23/20 0326 09/23/20 0722 09/23/20 1104  GLUCAP 129* 170* 193* 213* 188*   D-Dimer: No results for input(s): DDIMER in the last 72 hours. Hgb A1c: No results for input(s): HGBA1C in the last 72 hours. Lipid Profile: No results for input(s): CHOL, HDL, LDLCALC, TRIG, CHOLHDL, LDLDIRECT in the last 72 hours. Thyroid function studies: No results for input(s): TSH, T4TOTAL, T3FREE, THYROIDAB in the last 72 hours.  Invalid input(s): FREET3 Anemia work up: No results for input(s): VITAMINB12, FOLATE, FERRITIN, TIBC, IRON, RETICCTPCT in the last 72 hours. Sepsis Labs: Recent Labs  Lab 09/18/20 0404 09/19/20 0223 09/20/20 0238 09/21/20 0346  WBC 13.4* 10.8* 10.5 13.0*    Microbiology Recent Results (from the past 240 hour(s))  Culture, Respiratory w Gram Stain     Status: None   Collection Time: 09/17/20  8:27 AM   Specimen: Tracheal Aspirate; Respiratory  Result Value Ref Range Status   Specimen Description TRACHEAL ASPIRATE  Final   Special Requests NONE  Final   Gram Stain   Final    NO WBC SEEN ABUNDANT SQUAMOUS EPITHELIAL CELLS PRESENT RARE GRAM POSITIVE COCCI IN PAIRS    Culture   Final    FEW  Normal respiratory flora-no Staph aureus or Pseudomonas seen Performed at [facility], [address], [place], [place] [address]    Report Status 09/19/2020 FINAL  Final  MRSA Next Gen by PCR, Nasal     Status: None   Collection Time: 09/17/20  4:00 PM   Specimen: Nasal Mucosa; Nasal Swab  Result Value Ref Range Status   MRSA by PCR Next Gen NOT DETECTED NOT DETECTED Final    Comment: (NOTE) The GeneXpert MRSA Assay (FDA approved for NASAL specimens only), is one component of a comprehensive MRSA colonization surveillance program. It is not intended to diagnose MRSA infection nor to guide or monitor treatment for MRSA infections. Test performance is not FDA approved in patients  less than 61 years old. Performed at [facility], [address] [place], [place] [address]     Procedures and diagnostic studies:  CT CHEST WO CONTRAST  Result Date: 09/23/2020 CLINICAL DATA:  Pneumonia, effusion, or abscess suspected. EXAM: CT CHEST WITHOUT CONTRAST TECHNIQUE: Multidetector CT imaging of the chest was performed following the standard protocol without IV contrast. COMPARISON:  Chest radiograph 09/23/2020 and earlier; CT abdomen pelvis 09/12/2020 FINDINGS: Cardiovascular: Multivessel coronary artery disease. Mild calcific atherosclerosis of the aortic arch. Heart is normal in size. No abnormal pericardial fluid. Mediastinum/Nodes: Trace secretions within the trachea at the level of the carina. Enlarged 1.2 cm subcarinal lymph node as well as a 1.0 cm subaortic lymph node, likely reactive. Lungs/Pleura: Mild paraseptal emphysema in the left upper lobe. No pleural effusion. No pneumothorax. Patchy peribronchovascular consolidations in the left lung, greatest in the left lower lobe (series 6, image 78). At the lateral aspect of the anterior right upper lobe, there is a 0.4 cm nodule versus patchy consolidation (series 6, image 76). Faint reticulonodular opacity in the medial aspect of the anterior right upper lobe (series 6, images 71-75, 88). Of note, multifocal patchy airspace opacities seen in the right middle and right lower lobes on CT abdomen dated 09/12/2020 have nearly completely resolved. Upper Abdomen: Enteric tube is visualized within the gastric antrum. Partially visualized gallstones, unchanged compared to 09/12/2020. Remainder of the visualized upper abdomen is unremarkable. Musculoskeletal: Old left lower and right lateral rib fractures. No suspicious osseous lesion. IMPRESSION: 1. Multifocal patchy peribronchovascular consolidations in the left lung, greatest in the left lower lobe, consistent with multifocal pneumonia. 2. A 0.4 cm pulmonary nodule versus patchy  consolidation is noted in the anterior right upper lobe. Recommend attention on follow-up imaging. 3. Mediastinal lymphadenopathy, favored to be reactive in the setting of infection. 4. Multivessel coronary artery disease and calcific atherosclerosis of the aortic arch. 5. Mild left upper lobe emphysema. Aortic Atherosclerosis (ICD10-I70.0) and Emphysema (ICD10-J43.9). Electronically Signed   By: Sherron Ales MD   On: 09/23/2020 09:49   DG CHEST PORT 1 VIEW  Result Date: 09/23/2020 CLINICAL DATA:  Hypoxia EXAM: PORTABLE CHEST 1 VIEW COMPARISON:  09/21/2020, CT 11/03/2019, radiograph 09/14/2020 FINDINGS: Esophageal tube tip below the diaphragm but incompletely included. Right lung grossly clear. Volume loss with shift of mediastinal contents to the left. Small left-sided pleural effusion. Slightly improved aeration at left lung base. Stable cardiomediastinal silhouette with aortic atherosclerosis IMPRESSION: 1. Slightly improved aeration at left lung base with residual small left effusion and airspace disease. Persistent volume loss with shift of mediastinal contents to the left, central obstructing process is possible and chest CT follow-up should be considered. Electronically Signed   By: Jasmine Pang M.D.   On:  09/23/2020 03:09               LOS: 12 days   Masha Orbach  [facility]   Pager on [email or web address]. If 7PM-7AM, please contact night-coverage at [email or web address]     09/23/2020, 3:08 PM

## 2020-09-23 NOTE — Progress Notes (Signed)
CPT and nebulized breathing treatment not done at this time per RN request to allow patient to try to get to sleep. Patient has no wheezing, increased WOB, or distress.  RT will continue to monitor.

## 2020-09-23 NOTE — Progress Notes (Signed)
Nutrition Follow-up  DOCUMENTATION CODES:   Not applicable  INTERVENTION:   Continue tube feeds via Cortrak: - Osmolite 1.5 @ 65 ml/hr (1560 ml/day) - ProSource TF 45 ml daily  Tube feeding regimen provides 2380 kcal, 109 grams of protein, and 1189 ml of free H2O.  NUTRITION DIAGNOSIS:   Inadequate oral intake related to dysphagia (acute ischemic/embolic stroke) as evidenced by NPO status (SLP recommending NPO with alternate feeding source. Hx of aspiration PNA and previous strokes).  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 On 7/27, pt experienced an acute ischemic/embolic stroke with left hemiparesis, dysarthria, and severe dysphagia. Pt admitted with acute respiratory failure with hypoxia, aspiration PNA, COPD exacerbation, and sepsis.  7/28 - MBS with recommendations for NPO 7/31 - pt transferred to [facility] 8/01 - Cortrak placed (tip gastric)  Discussed pt with RN and during ICU rounds. Pt with worsening respiratory status this morning and went for CT of chest.  Pt remains NPO. Per notes, plan is for PEG placement on 8/09.  Spoke with pt at bedside who denies any issues with tube feeds. Pt denies any nausea, vomiting, or abdominal distention. Tube feeds infusing at goal.  Admit weight: 85.7 kg Current weight: 82.4 kg  Current TF: Osmolite 1.5 @ 65 ml/hr, ProSource TF 45 ml daily  Medications reviewed and include: fenofibrate, SSI q 4 hours, novolog 3 units q 4 hours, levemir 30 units BID, miralax, IV abx  Labs reviewed: BUN 23 CBG's: 129-216 x 24 hours  UOP: 1750 ml x 24  hours  Diet Order:   Diet Order             Diet NPO time specified  Diet effective midnight                   EDUCATION NEEDS:   Education needs have been addressed  Skin:  Skin Assessment: Reviewed RN Assessment  Last BM:  09/22/20  Height:   Ht Readings from Last 1 Encounters:  09/15/20 6' (1.829 m)    Weight:   Wt Readings from Last 1 Encounters:  09/23/20 82.4 kg    BMI:  Body mass index is 24.64 kg/m.  Estimated Nutritional Needs:   Kcal:  2200-2400  Protein:  105-115 grams  Fluid:  >2400 ml daily    Gustavus Bryant, MS, RD, LDN Inpatient Clinical Dietitian Please see AMiON for contact information.

## 2020-09-23 NOTE — Progress Notes (Addendum)
Pts O2 sats 86-88%, so oxygen increased from 6 liters salter up to 12 liters with no change. Pt pulled up in bed and PRN neb treatment given along with bed CPT. Pulse ox site changed, no change in O2 sats. Pt encouraged to cough, pt able to cough but no sputum produced. Lung sounds diminished bilaterally, more so on left, no increased WOB noted. RT called and made aware and at bedside.  MD paged about increased oxygen requirements. New orders received.   MD aware of VBG results and xray, order for heated high flow to be started. RT aware.

## 2020-09-23 NOTE — Progress Notes (Signed)
Patient transported to and from CT on NRB and 15lpm HFNC without complication.

## 2020-09-23 NOTE — Progress Notes (Signed)
Physical Therapy Treatment Patient Details Name: Brent [name] MRN: [id number] DOB: 06-04-1965 Today's Date: 09/23/2020    History of Present Illness 55 y.o. male presenting to ED with SOB, slurred speech and L sided weakness x2-3 days. Patient admitted with acute respiratory failure with hypoxia secondary to aspiration PNA, COPD exacerbation and sepsis. Imaging (+) R ACA CVA x2 within R cingulate gyrus/callosal body, R parietal lobe subcortical white matter infarct and questionable acute punctate infarct within L frontal lobe subcortical white matter. Transferred to ICU at [facility] 7/31 due to ongoing respiratory failure with complete opacification of left hemithorax.   PMHx significant for cocaine and tobacco use disorder, lumbago, HTN, HLD, DMII and Hx of CVA.    PT Comments    Patient alert, on HFNC 12L with sats 100% and eager to work with therapy. Overall, better tolerance for activity with repeated standing with stedy and then without DME. Without DME did require +2 moderate assist.     Follow Up Recommendations  CIR     Equipment Recommendations  None recommended by PT    Recommendations for Other Services       Precautions / Restrictions Precautions Precautions: Fall Precaution Comments: HFNC 12L    Mobility  Bed Mobility Overal bed mobility: Needs Assistance Bed Mobility: Supine to Sit;Rolling;Sit to Supine Rolling: Min assist   Supine to sit: Mod assist;HOB elevated;+2 for physical assistance Sit to supine: Mod assist;+2 for physical assistance   General bed mobility comments: min assist to roll for placing bed pad (with cues for technique); pt moves RLE over EOB without assist, assist to move LLE over EOB and to raise torso to sitting; return to bed with assist to guide torso and raise legs    Transfers Overall transfer level: Needs assistance Equipment used: 2 person hand held assist;Ambulation equipment used Transfers: Sit to/from Stand Sit to Stand: Min  assist;Mod assist;+2 physical assistance;From elevated surface         General transfer comment: with stedy from bed with +2 min assist x 3 reps; from bed with +2 assist/no device with moderate assist to prevent legs buckling and bring torso upright  Ambulation/Gait             General Gait Details: unable   Stairs             Wheelchair Mobility    Modified Rankin (Stroke Patients Only) Modified Rankin (Stroke Patients Only) Pre-Morbid Rankin Score: Moderate disability Modified Rankin: Severe disability     Balance Overall balance assessment: Needs assistance Sitting-balance support: Feet supported;Single extremity supported Sitting balance-Leahy Scale: Poor Sitting balance - Comments: Reliant on external assist to maintain static sitting balance at EOB; LOB in all directions with tendency for right lateral lean; improvement noted this date. Postural control: Left lateral lean;Right lateral lean;Posterior lean Standing balance support: Bilateral upper extremity supported;During functional activity Standing balance-Leahy Scale: Poor Standing balance comment: Reliant on external assist as well as UE support and use of Stedy with poor upright postural stability;                            Cognition Arousal/Alertness: Awake/alert Behavior During Therapy: WFL for tasks assessed/performed Overall Cognitive Status: No family/caregiver present to determine baseline cognitive functioning                                 General Comments: Follows 1-step verbal  commands with good accuracy; A&Ox3.      Exercises      General Comments General comments (skin integrity, edema, etc.): improved standing posture and stability in stedy and progressed to standing without device      Pertinent Vitals/Pain Pain Assessment: No/denies pain Faces Pain Scale: No hurt    Home Living                      Prior Function            PT Goals  (current goals can now be found in the care plan section) Acute Rehab PT Goals Patient Stated Goal: return home after rehab Time For Goal Achievement: 09/25/20 Potential to Achieve Goals: Good Progress towards PT goals: Progressing toward goals    Frequency    Min 4X/week      PT Plan Current plan remains appropriate    Co-evaluation              AM-PAC PT "6 Clicks" Mobility   Outcome Measure  Help needed turning from your back to your side while in a flat bed without using bedrails?: A Lot Help needed moving from lying on your back to sitting on the side of a flat bed without using bedrails?: Total Help needed moving to and from a bed to a chair (including a wheelchair)?: Total Help needed standing up from a chair using your arms (e.g., wheelchair or bedside chair)?: Total Help needed to walk in hospital room?: Total Help needed climbing 3-5 steps with a railing? : Total 6 Click Score: 7    End of Session Equipment Utilized During Treatment: Oxygen;Gait belt Activity Tolerance: Patient tolerated treatment well Patient left: with call bell/phone within reach;in bed;with bed alarm set (chair posiion) Nurse Communication: Mobility status;Need for lift equipment;Other (comment) (maxisky lift not working; Pt's IV in right hand came out) PT Visit Diagnosis: Unsteadiness on feet (R26.81);Other abnormalities of gait and mobility (R26.89);Muscle weakness (generalized) (M62.81)     Time: 1024-1100 PT Time Calculation (min) (ACUTE ONLY): 36 min  Charges:  $Therapeutic Activity: 23-37 mins                      Jerolyn Center, PT Pager [phone]    Zena Amos 09/23/2020, 12:26 PM

## 2020-09-23 NOTE — Progress Notes (Signed)
Patient tentatively scheduled for gastrostomy tube placement in IR 09/24/20. Patient assessed at the bedside and found to be alert/oriented and breathing comfortably via HFNC. Discussed with patient and bedside RN the concern about patient's ability to lie flat and tolerate sedation while maintaining O2 sats. Per RN, the patient maintained  his O2 sats during his time in the CT scanner today.   The patient understands that if he is unable to tolerate lying flat in IR the procedure will be delayed.   AM lab orders placed. Order placed to hold tube feeds at midnight. Last dose of Plavix was 09/19/20. IR will send for the patient tomorrow as time permits. Please see full IR Consult note from 09/17/20 for more information. Consent on Chart.   Please call IR with any questions.  Alwyn Ren, [place] [phone] 09/23/2020, 1:41 PM

## 2020-09-23 NOTE — Progress Notes (Signed)
CPT through the bed completed by RN.

## 2020-09-23 NOTE — Progress Notes (Signed)
SLP Cancellation Note  Patient Details Name: Brent [name] MRN: [id number] DOB: 1965/04/05   Cancelled treatment:       Reason Eval/Treat Not Completed: Patient at procedure or test/unavailable;Medical issues which prohibited therapy  Pt presently off unit for CT of chest with concern for worsening respiratory status, requiring HFNC at 20 L/min at 100% over night. Not medically appropriate for PO trials at this time.  ST service will continue efforts as pt is appropriate.  Brent [name], M.S., CCC-SLP Speech-Language Pathologist Acute Rehabilitation Services Pager: [phone]  Brent [name] 09/23/2020, 8:43 AM

## 2020-09-23 NOTE — Progress Notes (Signed)
Patient sitting in chair position in the bed.  CPT not done at this time due to bed position.  Didcusses with RN who will vibrate the bed once he is back in supine position.

## 2020-09-24 ENCOUNTER — Inpatient Hospital Stay (HOSPITAL_COMMUNITY): Payer: Medicaid Other

## 2020-09-24 ENCOUNTER — Encounter (HOSPITAL_COMMUNITY): Payer: Self-pay | Admitting: Interventional Radiology

## 2020-09-24 DIAGNOSIS — I639 Cerebral infarction, unspecified: Secondary | ICD-10-CM | POA: Diagnosis not present

## 2020-09-24 DIAGNOSIS — J9601 Acute respiratory failure with hypoxia: Secondary | ICD-10-CM | POA: Diagnosis not present

## 2020-09-24 DIAGNOSIS — J690 Pneumonitis due to inhalation of food and vomit: Secondary | ICD-10-CM | POA: Diagnosis not present

## 2020-09-24 DIAGNOSIS — J441 Chronic obstructive pulmonary disease with (acute) exacerbation: Secondary | ICD-10-CM | POA: Diagnosis not present

## 2020-09-24 HISTORY — PX: IR GASTROSTOMY TUBE MOD SED: IMG625

## 2020-09-24 LAB — GLUCOSE, CAPILLARY
Glucose-Capillary: 103 mg/dL — ABNORMAL HIGH (ref 70–99)
Glucose-Capillary: 106 mg/dL — ABNORMAL HIGH (ref 70–99)
Glucose-Capillary: 113 mg/dL — ABNORMAL HIGH (ref 70–99)
Glucose-Capillary: 115 mg/dL — ABNORMAL HIGH (ref 70–99)
Glucose-Capillary: 154 mg/dL — ABNORMAL HIGH (ref 70–99)
Glucose-Capillary: 156 mg/dL — ABNORMAL HIGH (ref 70–99)
Glucose-Capillary: 93 mg/dL (ref 70–99)

## 2020-09-24 LAB — CBC
HCT: 43.1 % (ref 39.0–52.0)
Hemoglobin: 14.1 g/dL (ref 13.0–17.0)
MCH: 31.3 pg (ref 26.0–34.0)
MCHC: 32.7 g/dL (ref 30.0–36.0)
MCV: 95.6 fL (ref 80.0–100.0)
Platelets: 251 10*3/uL (ref 150–400)
RBC: 4.51 MIL/uL (ref 4.22–5.81)
RDW: 12.8 % (ref 11.5–15.5)
WBC: 17.6 10*3/uL — ABNORMAL HIGH (ref 4.0–10.5)
nRBC: 0 % (ref 0.0–0.2)

## 2020-09-24 IMAGING — XA IR PERC PLACEMENT GASTROSTOMY
1 series · 1 of 1 positions shown · non-contrast
Comparison: none

CLINICAL DATA: Shortness of breath, dysphagia with aspiration,
needs enteral feeding support

EXAM:
PERC PLACEMENT GASTROSTOMY
FLUOROSCOPY TIME:  3 minutes 24 seconds
TECHNIQUE: The procedure, risks, benefits, and alternatives were explained to
the patient. Questions regarding the procedure were encouraged and
answered. The patient understands and consents to the procedure.

[Series 2: fl (-) angio · 1 of 1 slices shown]
[im 1/1]
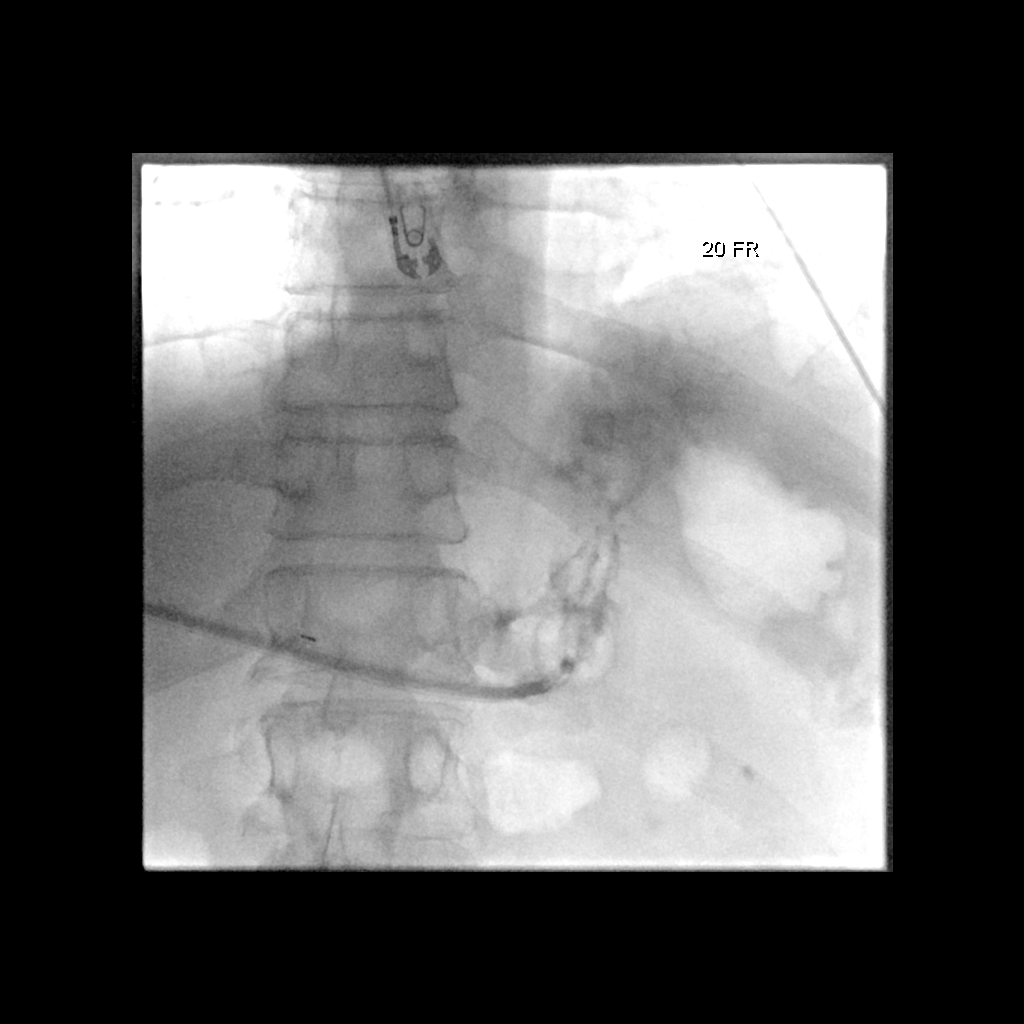

[1 of 1 positions shown; findings below may reference images not displayed]

As antibiotic prophylaxis, cefazolin 2 g was ordered pre-procedure
and administered intravenously within one hour of incision.

A safe percutaneous approach was confirmed on previous day's CT
chest.

A 5 French angiographic catheter was placed as orogastric tube. The
upper abdomen was prepped with Betadine, draped in usual sterile
fashion, and infiltrated locally with 1% lidocaine.

Intravenous Fentanyl [K9] and Versed 1.5mg were administered as
conscious sedation during continuous monitoring of the patient's
level of consciousness and physiological / cardiorespiratory status
by the radiology RN, with a total moderate sedation time of 8
minutes. 0.5 mg glucagon given IV to facilitate gastric
distention.Stomach was insufflated using air through the orogastric
tube. An 18 French sheath needle was advanced percutaneously into
the gastric lumen under fluoroscopy. Gas could be aspirated and a
small contrast injection confirmed intraluminal spread. The sheath
was exchanged over a guidewire for a 9 French vascular sheath,
through which the snare device was advanced and used to snare a
guidewire passed through the orogastric tube. This was withdrawn,
and the snare attached to the 20 French pull-through gastrostomy
tube, which was advanced antegrade, positioned with the internal
bumper securing the anterior gastric wall to the anterior abdominal
wall. Small contrast injection confirms appropriate positioning. The
external bumper was applied and the catheter was flushed.

COMPLICATIONS:
COMPLICATIONS
none
IMPRESSION: 1. Technically successful 20 French pull-through gastrostomy
placement under fluoroscopy.

## 2020-09-24 MED ORDER — LIDOCAINE HCL 1 % IJ SOLN
INTRAMUSCULAR | Status: AC
  Filled 2020-09-24: qty 20

## 2020-09-24 MED ORDER — GLUCAGON HCL RDNA (DIAGNOSTIC) 1 MG IJ SOLR
INTRAMUSCULAR | Status: AC | PRN
  Administered 2020-09-24: .5 mg via INTRAVENOUS

## 2020-09-24 MED ORDER — FENTANYL CITRATE (PF) 100 MCG/2ML IJ SOLN
INTRAMUSCULAR | Status: AC
  Filled 2020-09-24: qty 2

## 2020-09-24 MED ORDER — HALOPERIDOL 0.5 MG PO TABS
0.5000 mg | ORAL_TABLET | Freq: Once | ORAL | Status: AC
  Administered 2020-09-24: 0.5 mg
  Filled 2020-09-24: qty 1

## 2020-09-24 MED ORDER — FENTANYL CITRATE (PF) 100 MCG/2ML IJ SOLN
INTRAMUSCULAR | Status: AC | PRN
  Administered 2020-09-24: 25 ug via INTRAVENOUS
  Administered 2020-09-24: 50 ug via INTRAVENOUS

## 2020-09-24 MED ORDER — CLOPIDOGREL BISULFATE 75 MG PO TABS
75.0000 mg | ORAL_TABLET | Freq: Every day | ORAL | Status: DC
  Administered 2020-09-25 – 2020-09-27 (×3): 75 mg
  Filled 2020-09-24 (×3): qty 1

## 2020-09-24 MED ORDER — IOHEXOL 300 MG/ML  SOLN
50.0000 mL | Freq: Once | INTRAMUSCULAR | Status: AC | PRN
  Administered 2020-09-24: 10 mL

## 2020-09-24 MED ORDER — ONDANSETRON HCL 4 MG/2ML IJ SOLN: 4.0000 mg | INTRAMUSCULAR | Status: DC | PRN

## 2020-09-24 MED ORDER — CEFAZOLIN SODIUM-DEXTROSE 2-4 GM/100ML-% IV SOLN
INTRAVENOUS | Status: AC
  Filled 2020-09-24: qty 100

## 2020-09-24 MED ORDER — HYDROCODONE-ACETAMINOPHEN 5-325 MG PO TABS: 1.0000 | ORAL_TABLET | ORAL | Status: DC | PRN

## 2020-09-24 MED ORDER — MIDAZOLAM HCL 2 MG/2ML IJ SOLN
INTRAMUSCULAR | Status: AC
  Filled 2020-09-24: qty 2

## 2020-09-24 MED ORDER — CLOPIDOGREL BISULFATE 75 MG PO TABS: 75.0000 mg | ORAL_TABLET | Freq: Every day | ORAL | Status: DC

## 2020-09-24 MED ORDER — MIDAZOLAM HCL 2 MG/2ML IJ SOLN
INTRAMUSCULAR | Status: AC | PRN
  Administered 2020-09-24: 1 mg via INTRAVENOUS
  Administered 2020-09-24: 0.5 mg via INTRAVENOUS

## 2020-09-24 MED ORDER — LIDOCAINE HCL (PF) 1 % IJ SOLN
INTRAMUSCULAR | Status: AC | PRN
Start: 2020-09-24 — End: 2020-09-24
  Administered 2020-09-24: 10 mL via SUBCUTANEOUS

## 2020-09-24 MED ORDER — HYDROMORPHONE HCL 1 MG/ML IJ SOLN
1.0000 mg | INTRAMUSCULAR | Status: DC | PRN
  Administered 2020-09-24 – 2020-09-27 (×8): 1 mg via INTRAVENOUS
  Filled 2020-09-24 (×9): qty 1

## 2020-09-24 MED ORDER — CEFAZOLIN SODIUM-DEXTROSE 2-4 GM/100ML-% IV SOLN
INTRAVENOUS | Status: AC | PRN
  Administered 2020-09-24: 2 g via INTRAVENOUS

## 2020-09-24 MED ORDER — GLUCAGON HCL RDNA (DIAGNOSTIC) 1 MG IJ SOLR
INTRAMUSCULAR | Status: AC
  Filled 2020-09-24: qty 1

## 2020-09-24 MED ORDER — HALOPERIDOL LACTATE 5 MG/ML IJ SOLN
5.0000 mg | Freq: Four times a day (QID) | INTRAMUSCULAR | Status: DC | PRN
  Administered 2020-09-27: 5 mg via INTRAMUSCULAR
  Filled 2020-09-24: qty 1

## 2020-09-24 NOTE — Progress Notes (Signed)
Occupational Therapy Treatment Patient Details Name: Brent [name] MRN: [id number] DOB: February 15, 1966 Today's Date: 09/24/2020    History of present illness 55 y.o. male presenting to ED with SOB, slurred speech and L sided weakness x2-3 days. Patient admitted with acute respiratory failure with hypoxia secondary to aspiration PNA, COPD exacerbation and sepsis. Imaging (+) R ACA CVA x2 within R cingulate gyrus/callosal body, R parietal lobe subcortical white matter infarct and questionable acute punctate infarct within L frontal lobe subcortical white matter. Transferred to ICU at [facility] 7/31 due to ongoing respiratory failure with complete opacification of left hemithorax.   PMHx significant for cocaine and tobacco use disorder, lumbago, HTN, HLD, DMII and Hx of CVA.   OT comments  Treatment session with focus on NMR, bed mobility, and functional transfers in prep for ADLs. Patient making steady progress toward goals completing sit to stand transfers from EOB with HHA +2 without ambulation equipment this date. Patient continues to require L knee block and max multimodal cues for orientation to midline demonstrating. Patient continues to be limited by increased tone in LLE, poor sitting/standing balance, decreased cardiopulmonary endurance and L hemiplegia and would benefit from continued acute OT services. Patient now maintaining SpO2 94-96% on 4L. OT will continue to follow acutely.    Follow Up Recommendations  CIR    Equipment Recommendations  Other (comment) (Defer to next level of care.)    Recommendations for Other Services Rehab consult    Precautions / Restrictions Precautions Precautions: Fall Precaution Comments: 4L O2 via [place] Restrictions Weight Bearing Restrictions: No       Mobility Bed Mobility Overal bed mobility: Needs Assistance Bed Mobility: Supine to Sit;Rolling;Sit to Supine Rolling: Min assist   Supine to sit: Mod assist;HOB elevated;+2 for physical  assistance Sit to supine: Mod assist;+2 for physical assistance   General bed mobility comments: Supine to sit on R with Mod A +2. Education on hemi technique for assisting affected LLE to EOB with use of RLE. Mod A +2 to elevate trunk with HOB elevated. Mod A +2 for return to supine in prep for PEG palcement procedure.    Transfers Overall transfer level: Needs assistance Equipment used: 2 person hand held assist Transfers: Sit to/from Stand Sit to Stand: Mod assist;+2 physical assistance;From elevated surface;Max assist         General transfer comment: Mod A +2 for sit to stand from EOB with HHA +2 via face to face transfer and L knee block. Max multimodal cues for orientation to midline.    Balance Overall balance assessment: Needs assistance Sitting-balance support: Feet supported;Single extremity supported Sitting balance-Leahy Scale: Poor Sitting balance - Comments: Reliant on external assist to maintain static sitting balance at EOB; LOB in all directions with tendency for right lateral lean; improvement noted this date. Postural control: Left lateral lean;Right lateral lean;Posterior lean Standing balance support: Bilateral upper extremity supported;During functional activity Standing balance-Leahy Scale: Poor Standing balance comment: +2 external assist, max multimodal cues and L knee block without ambulation equipment this date. Patient with great difficulty finding and maintaining midline.                           ADL either performed or assessed with clinical judgement   ADL Overall ADL's : Needs assistance/impaired     Grooming: Moderate assistance;Sitting Grooming Details (indicate cue type and reason): Set-up assist to wash face seated EOB.  Vision       Perception     Praxis      Cognition Arousal/Alertness: Awake/alert Behavior During Therapy: WFL for tasks assessed/performed Overall  Cognitive Status: No family/caregiver present to determine baseline cognitive functioning                                 General Comments: Follows 1-step verbal commands with good accuracy; A&Ox3; perseverates on use of maxisky and calling his significant other.        Exercises     Shoulder Instructions       General Comments Focus of session on standing without ambulation equipment with +2 assist. Patient returned to supine at conclusion of session in prep for PEG palcement procedure. Patient maintained SpO2 94-96% on 4L O2 at rest and with activity. Breathing treatment given prior to entry.    Pertinent Vitals/ Pain       Pain Assessment: No/denies pain  Home Living                                          Prior Functioning/Environment              Frequency  Min 2X/week        Progress Toward Goals  OT Goals(current goals can now be found in the care plan section)     Acute Rehab OT Goals Patient Stated Goal: return home after rehab OT Goal Formulation: With patient Time For Goal Achievement: 09/26/20 Potential to Achieve Goals: Fair ADL Goals Pt Will Perform Grooming: with min assist;sitting;with adaptive equipment Pt Will Perform Upper Body Dressing: with min assist;sitting Pt Will Perform Lower Body Dressing: with mod assist;sitting/lateral leans Pt Will Transfer to Toilet: with mod assist;stand pivot transfer;bedside commode Pt/caregiver will Perform Home Exercise Program: Increased ROM;Increased strength;Left upper extremity;With Supervision  Plan Discharge plan remains appropriate;Frequency remains appropriate    Co-evaluation                 AM-PAC OT "6 Clicks" Daily Activity     Outcome Measure   Help from another person eating meals?: Total Help from another person taking care of personal grooming?: A Lot Help from another person toileting, which includes using toliet, bedpan, or urinal?: Total Help  from another person bathing (including washing, rinsing, drying)?: A Lot Help from another person to put on and taking off regular upper body clothing?: A Lot Help from another person to put on and taking off regular lower body clothing?: Total 6 Click Score: 9    End of Session Equipment Utilized During Treatment: Gait belt;Oxygen (4L O2 via [place])  OT Visit Diagnosis: Unsteadiness on feet (R26.81);Muscle weakness (generalized) (M62.81);Other symptoms and signs involving the nervous system (R29.898);Hemiplegia and hemiparesis Hemiplegia - Right/Left: Left Hemiplegia - dominant/non-dominant: Non-Dominant Hemiplegia - caused by: Cerebral infarction   Activity Tolerance Patient tolerated treatment well   Patient Left in bed;with call bell/phone within reach;with bed alarm set   Nurse Communication Mobility status;Other (comment) (Response to treatment)        Time: 2876-8115 OT Time Calculation (min): 23 min  Charges: OT General Charges $OT Visit: 1 Visit OT Treatments $Therapeutic Activity: 8-22 mins $Neuromuscular Re-education: 8-22 mins  Aryanne Gilleland H. OTR/L Supplemental OT, Department of rehab services [phone]   Kayti Poss R H. 09/24/2020, 11:14 AM

## 2020-09-24 NOTE — Progress Notes (Signed)
Progress Note    Brent [name]  ZOX:[id number] DOB: [birth year]  DOA: 09/11/2020 PCP: Beatrix Fetters, MD      Brief Narrative:    Medical records reviewed and are as summarized below:  Brent [name] is a 55 y.o. male with past medical history significant for hypertension, type 2 diabetes mellitus, hyperlipidemia, cocaine abuse, tobacco use disorder, seizure disorder, OSA, GERD, stroke, who presented to [facility] with shortness of breath, cough, slurred speech, left-sided weakness and facial droop on 09/11/2020.  He was found to have sepsis secondary to aspiration pneumonia, COPD exacerbation, acute hypoxic respiratory failure and acute stroke.  He was transferred to [facility] for further management.  He was treated with empiric IV antibiotics, steroids and bronchodilators.  He required heated humidified high flow oxygen for oxygenation.  NG tube was placed for enteral nutrition because of dysphagia.  Ultimately, PEG tube was placed by IR for severe dysphagia.   Assessment/Plan:   Active Problems:   Hypertriglyceridemia   Tobacco abuse   COPD with acute exacerbation (HCC)   Cocaine abuse (HCC)   CVA (cerebral vascular accident) (HCC)   Acute respiratory failure with hypoxia (HCC)   Aspiration pneumonitis (HCC)   Dysarthria   Sepsis due to undetermined organism (HCC)   Goals of care, counseling/discussion   Nutrition Problem: Inadequate oral intake Etiology: dysphagia (acute ischemic/embolic stroke)  Signs/Symptoms: NPO status (SLP recommending NPO with alternate feeding source. Hx of aspiration PNA and previous strokes)   Body mass index is 24.73 kg/m.   Sepsis secondary to aspiration pneumonia, multifocal pneumonia: Discontinue IV antibiotics.  Completed 14 days of antibiotics.  Acute hypoxic respiratory failure: Improving.  He is on 4 L/min oxygen via nasal cannula.  Taper down oxygen as able.    Acute stroke with left hemiparesis:  Continue aspirin.  Resume Plavix tomorrow.  Continue PT and OT.  Dysphagia: S/p PEG tube placement by IR today.  Follow-up with dietitian for tube feedings.  COPD exacerbation: Continue bronchodilators and prednisone.  Outpatient follow-up with pulmonologist for PFTs.  Type II DM: Resume Levemir.  Continue NovoLog as needed for hyperglycemia.  Other comorbidities include hypertension, hyperlipidemia, seizure disorder, chronic back pain, cocaine abuse, tobacco use disorder  Follow-up with case manager for disposition to inpatient rehab.   Diet Order             Diet NPO time specified  Diet effective midnight                      Consultants: Intensivist  Procedures: Cortrak feeding tube placement on 09/16/2020    Medications:    arformoterol  15 mcg Nebulization BID   aspirin  81 mg Per Tube Daily   budesonide (PULMICORT) nebulizer solution  0.5 mg Nebulization BID   carBAMazepine  200 mg Per Tube Q6H   chlorhexidine  15 mL Mouth Rinse BID   Chlorhexidine Gluconate Cloth  6 each Topical Q2200   [START ON 09/25/2020] clopidogrel  75 mg Per Tube Daily   enoxaparin (LOVENOX) injection  40 mg Subcutaneous Q24H   feeding supplement (PROSource TF)  45 mL Per Tube Daily   fenofibrate  160 mg Per Tube Daily   guaiFENesin  10 mL Per Tube Q4H   insulin aspart  0-20 Units Subcutaneous Q4H   insulin aspart  3 Units Subcutaneous Q4H   insulin detemir  30 Units Subcutaneous BID   ipratropium-albuterol  3 mL Nebulization Q4H  lidocaine       mouth rinse  15 mL Mouth Rinse q12n4p   polyethylene glycol  17 g Per Tube Daily   rosuvastatin  20 mg Per Tube Daily   Continuous Infusions:  sodium chloride Stopped (09/19/20 0355)   sodium chloride 10 mL/hr at 09/24/20 0600    ceFAZolin (ANCEF) IV     feeding supplement (OSMOLITE 1.5 CAL) Stopped (09/23/20 2343)   piperacillin-tazobactam (ZOSYN)  IV 3.375 g (09/24/20 0735)     Anti-infectives (From admission, onward)     Start     Dose/Rate Route Frequency Ordered Stop   09/24/20 1020  ceFAZolin (ANCEF) IVPB 2g/100 mL premix        over 30 Minutes Intravenous Continuous PRN 09/24/20 1027 09/24/20 1020   09/24/20 0000  ceFAZolin (ANCEF) IVPB 2g/100 mL premix        2 g 200 mL/hr over 30 Minutes Intravenous To Radiology 09/23/20 1618 09/25/20 0000   09/18/20 0500  vancomycin (VANCOREADY) IVPB 1250 mg/250 mL  Status:  Discontinued        1,250 mg 166.7 mL/hr over 90 Minutes Intravenous Every 12 hours 09/17/20 1604 09/18/20 1304   09/18/20 0000  ceFAZolin (ANCEF) IVPB 2g/100 mL premix  Status:  Discontinued        2 g 200 mL/hr over 30 Minutes Intravenous To Radiology 09/17/20 1017 09/17/20 1021   09/17/20 1700  vancomycin (VANCOREADY) IVPB 1750 mg/350 mL        1,750 mg 175 mL/hr over 120 Minutes Intravenous  Once 09/17/20 1601 09/17/20 1907   09/11/20 1600  piperacillin-tazobactam (ZOSYN) IVPB 3.375 g        3.375 g 12.5 mL/hr over 240 Minutes Intravenous Every 8 hours 09/11/20 0740 09/24/20 2359   09/11/20 0815  azithromycin (ZITHROMAX) 500 mg in sodium chloride 0.9 % 250 mL IVPB  Status:  Discontinued        500 mg 250 mL/hr over 60 Minutes Intravenous Every 24 hours 09/11/20 0811 09/15/20 1420   09/11/20 0815  piperacillin-tazobactam (ZOSYN) IVPB 3.375 g        3.375 g 100 mL/hr over 30 Minutes Intravenous  Once 09/11/20 0815 09/11/20 1218              Family Communication/Anticipated D/C date and plan/Code Status   DVT prophylaxis: enoxaparin (LOVENOX) injection 40 mg Start: 09/24/20 2200     Code Status: Full Code  Family Communication: None Disposition Plan:    Status is: Inpatient  Remains inpatient appropriate because:Inpatient level of care appropriate due to severity of illness  Dispo: The patient is from: Home              Anticipated d/c is to: CIR              Patient currently is not medically stable to d/c.   Difficult to place patient  No           Subjective:   Interval events noted.  He feels better today.  He has no complaints.  Objective:    Vitals:   09/24/20 1100 09/24/20 1113 09/24/20 1200 09/24/20 1217  BP: (!) 158/99   (!) 144/102  Pulse: 86   85  Resp: 12  17 15   Temp:  98.1 F (36.7 C)    TempSrc:  Axillary    SpO2: 93%   91%  Weight:      Height:       No data found.   Intake/Output Summary (Last 24  hours) at 09/24/2020 1221 Last data filed at 09/24/2020 0800 Gross per 24 hour  Intake 1507.4 ml  Output 1250 ml  Net 257.4 ml   Filed Weights   09/22/20 0500 09/23/20 0500 09/24/20 0500  Weight: 82.8 kg 82.4 kg 82.7 kg    Exam:  GEN: NAD SKIN: Warm and dry EYES: No pallor or icterus ENT: MMM CV: RRR PULM: CTA B ABD: soft, ND, NT, +BS CNS: AAO x 2 (person and place), left hemiparesis LUE 2/5, LLE 1/5 EXT: No edema or tenderness     Data Reviewed:   I have personally reviewed following labs and imaging studies:  Labs: Labs show the following:   Basic Metabolic Panel: Recent Labs  Lab 09/17/20 1652 09/18/20 0404 09/18/20 0404 09/19/20 0223 09/20/20 0238 09/21/20 0346 09/23/20 0332  NA  --  148*  --  143 145 143 139  K  --  3.8   < > 3.7 3.9 4.1 4.0  CL  --  110  --  107 111 108 105  CO2  --  25  --  27 24 27 24   GLUCOSE  --  126*  --  197* 206* 209* 214*  BUN  --  21*  --  21* 19 18 23*  CREATININE  --  0.85  --  0.88 0.87 0.85 0.97  CALCIUM  --  9.7  --  9.4 9.3 9.3 9.5  MG 1.7 1.6*  --   --  1.6* 1.7  --   PHOS 2.2* 3.3  --   --   --  3.3  --    < > = values in this interval not displayed.   GFR Estimated Creatinine Clearance: 94.4 mL/min (by C-G formula based on SCr of 0.97 mg/dL). Liver Function Tests: No results for input(s): AST, ALT, ALKPHOS, BILITOT, PROT, ALBUMIN in the last 168 hours. No results for input(s): LIPASE, AMYLASE in the last 168 hours. No results for input(s): AMMONIA in the last 168 hours. Coagulation profile Recent Labs  Lab  09/23/20 1346  INR 1.0    CBC: Recent Labs  Lab 09/18/20 0404 09/19/20 0223 09/20/20 0238 09/21/20 0346 09/24/20 0129  WBC 13.4* 10.8* 10.5 13.0* 17.6*  HGB 13.5 13.7 14.2 13.7 14.1  HCT 42.1 41.1 42.1 41.6 43.1  MCV 97.9 96.0 96.1 94.8 95.6  PLT 355 343 322 300 251   Cardiac Enzymes: No results for input(s): CKTOTAL, CKMB, CKMBINDEX, TROPONINI in the last 168 hours. BNP (last 3 results) No results for input(s): PROBNP in the last 8760 hours. CBG: Recent Labs  Lab 09/23/20 2333 09/24/20 0315 09/24/20 0545 09/24/20 0742 09/24/20 1111  GLUCAP 131* 93 113* 103* 156*   D-Dimer: No results for input(s): DDIMER in the last 72 hours. Hgb A1c: No results for input(s): HGBA1C in the last 72 hours. Lipid Profile: No results for input(s): CHOL, HDL, LDLCALC, TRIG, CHOLHDL, LDLDIRECT in the last 72 hours. Thyroid function studies: No results for input(s): TSH, T4TOTAL, T3FREE, THYROIDAB in the last 72 hours.  Invalid input(s): FREET3 Anemia work up: No results for input(s): VITAMINB12, FOLATE, FERRITIN, TIBC, IRON, RETICCTPCT in the last 72 hours. Sepsis Labs: Recent Labs  Lab 09/19/20 0223 09/20/20 0238 09/21/20 0346 09/24/20 0129  WBC 10.8* 10.5 13.0* 17.6*    Microbiology Recent Results (from the past 240 hour(s))  Culture, Respiratory w Gram Stain     Status: None   Collection Time: 09/17/20  8:27 AM   Specimen: Tracheal Aspirate; Respiratory  Result Value  Ref Range Status   Specimen Description TRACHEAL ASPIRATE  Final   Special Requests NONE  Final   Gram Stain   Final    NO WBC SEEN ABUNDANT SQUAMOUS EPITHELIAL CELLS PRESENT RARE GRAM POSITIVE COCCI IN PAIRS    Culture   Final    FEW Normal respiratory flora-no Staph aureus or Pseudomonas seen Performed at [facility]  [facility], [address] [place], [place] [address]    Report Status 09/19/2020 FINAL  Final  MRSA Next Gen by PCR, Nasal     Status: None   Collection Time: 09/17/20  4:00 PM    Specimen: Nasal Mucosa; Nasal Swab  Result Value Ref Range Status   MRSA by PCR Next Gen NOT DETECTED NOT DETECTED Final    Comment: (NOTE) The GeneXpert MRSA Assay (FDA approved for NASAL specimens only), is one component of a comprehensive MRSA colonization surveillance program. It is not intended to diagnose MRSA infection nor to guide or monitor treatment for MRSA infections. Test performance is not FDA approved in patients less than 56 years old. Performed at [facility]  [facility], [address] [place], [place] [address]     Procedures and diagnostic studies:  CT CHEST WO CONTRAST  Result Date: 09/23/2020 CLINICAL DATA:  Pneumonia, effusion, or abscess suspected. EXAM: CT CHEST WITHOUT CONTRAST TECHNIQUE: Multidetector CT imaging of the chest was performed following the standard protocol without IV contrast. COMPARISON:  Chest radiograph 09/23/2020 and earlier; CT abdomen pelvis 09/12/2020 FINDINGS: Cardiovascular: Multivessel coronary artery disease. Mild calcific atherosclerosis of the aortic arch. Heart is normal in size. No abnormal pericardial fluid. Mediastinum/Nodes: Trace secretions within the trachea at the level of the carina. Enlarged 1.2 cm subcarinal lymph node as well as a 1.0 cm subaortic lymph node, likely reactive. Lungs/Pleura: Mild paraseptal emphysema in the left upper lobe. No pleural effusion. No pneumothorax. Patchy peribronchovascular consolidations in the left lung, greatest in the left lower lobe (series 6, image 78). At the lateral aspect of the anterior right upper lobe, there is a 0.4 cm nodule versus patchy consolidation (series 6, image 76). Faint reticulonodular opacity in the medial aspect of the anterior right upper lobe (series 6, images 71-75, 88). Of note, multifocal patchy airspace opacities seen in the right middle and right lower lobes on CT abdomen dated 09/12/2020 have nearly completely resolved. Upper Abdomen: Enteric tube is visualized within the  gastric antrum. Partially visualized gallstones, unchanged compared to 09/12/2020. Remainder of the visualized upper abdomen is unremarkable. Musculoskeletal: Old left lower and right lateral rib fractures. No suspicious osseous lesion. IMPRESSION: 1. Multifocal patchy peribronchovascular consolidations in the left lung, greatest in the left lower lobe, consistent with multifocal pneumonia. 2. A 0.4 cm pulmonary nodule versus patchy consolidation is noted in the anterior right upper lobe. Recommend attention on follow-up imaging. 3. Mediastinal lymphadenopathy, favored to be reactive in the setting of infection. 4. Multivessel coronary artery disease and calcific atherosclerosis of the aortic arch. 5. Mild left upper lobe emphysema. Aortic Atherosclerosis (ICD10-I70.0) and Emphysema (ICD10-J43.9). Electronically Signed   By: Sherron AlesLaura  Parra MD   On: 09/23/2020 09:49   IR GASTROSTOMY TUBE MOD SED  Result Date: 09/24/2020 Oley BalmHassell, Daniel, MD     09/24/2020 11:00 AM Procedure: Perc gastrostomy tube placement 5012f EBL:   minimal Complications:  none immediate See full dictation in [facility] PACS. Thora Lance. Daniel Hassell MD Main # [phone] Pager  [phone]   DG CHEST PORT 1 VIEW  Result Date: 09/23/2020 CLINICAL  DATA:  Hypoxia EXAM: PORTABLE CHEST 1 VIEW COMPARISON:  09/21/2020, CT 11/03/2019, radiograph 09/14/2020 FINDINGS: Esophageal tube tip below the diaphragm but incompletely included. Right lung grossly clear. Volume loss with shift of mediastinal contents to the left. Small left-sided pleural effusion. Slightly improved aeration at left lung base. Stable cardiomediastinal silhouette with aortic atherosclerosis IMPRESSION: 1. Slightly improved aeration at left lung base with residual small left effusion and airspace disease. Persistent volume loss with shift of mediastinal contents to the left, central obstructing process is possible and chest CT follow-up should be considered. Electronically Signed   By: Jasmine Pang M.D.   On: 09/23/2020 03:09               LOS: 13 days   Teighlor Korson  [facility]   Pager on [email or web address]. If 7PM-7AM, please contact night-coverage at [email or web address]     09/24/2020, 12:21 PM

## 2020-09-24 NOTE — Progress Notes (Signed)
SLP Cancellation Note  Patient Details Name: Brent [name] MRN: [id number] DOB: 03/04/1965   Cancelled treatment:       Reason Eval/Treat Not Completed: Medical issues which prohibited therapy. Pt NPO, TFs held for potential PEG placement today. Will f/u as able.     Mahala Menghini., M.A. CCC-SLP Acute Rehabilitation Services Pager [phone] Office [phone]  09/24/2020, 7:20 AM

## 2020-09-24 NOTE — Progress Notes (Signed)
Physical Therapy Treatment Patient Details Name: Brent [name] MRN: [id number] DOB: [birth year] Today's Date: 09/24/2020    History of Present Illness 55 y.o. male presenting to ED with SOB, slurred speech and L sided weakness x2-3 days. Patient admitted with acute respiratory failure with hypoxia secondary to aspiration PNA, COPD exacerbation and sepsis. Imaging (+) R ACA CVA x2 within R cingulate gyrus/callosal body, R parietal lobe subcortical white matter infarct and questionable acute punctate infarct within L frontal lobe subcortical white matter. Transferred to ICU at [facility] 7/31 due to ongoing respiratory failure with complete opacification of left hemithorax.   PMHx significant for cocaine and tobacco use disorder, lumbago, HTN, HLD, DMII and Hx of CVA.    PT Comments    Patient perseverating on wanting to eat a Big Mac and wanting to call his wife to bring him food. Able to redirect (frequently) to attend to task of strengthening and mobility. Able to stand x 3 with +2 mod assist without use of stedy. Did not attempt pivot to chair as maxi-sky in his room is not working.     Follow Up Recommendations  CIR     Equipment Recommendations  None recommended by PT    Recommendations for Other Services       Precautions / Restrictions Precautions Precautions: Fall Precaution Comments: 4L O2 via [place] Restrictions Weight Bearing Restrictions: No    Mobility  Bed Mobility Overal bed mobility: Needs Assistance Bed Mobility: Rolling;Sidelying to Sit;Sit to Sidelying Rolling: Min assist Sidelying to sit: Mod assist;+2 for physical assistance Supine to sit: Mod assist;HOB elevated;+2 for physical assistance Sit to supine: Mod assist;+2 for physical assistance Sit to sidelying: Mod assist;+2 for physical assistance General bed mobility comments: rolling bil for pad placement; assist to take legs over EOB and raise torso    Transfers Overall transfer level: Needs  assistance Equipment used: 2 person hand held assist Transfers: Sit to/from Stand Sit to Stand: Mod assist;+2 physical assistance         General transfer comment: Mod A +2 for sit to stand from EOB with HHA +2 via face to face transfer and L knee block. Max multimodal cues for orientation to midline. repeated x 3 with seated rest between. Patient tending to push himself to his left using his RUE and RLE tending to abdct  Ambulation/Gait             General Gait Details: unable   Stairs             Wheelchair Mobility    Modified Rankin (Stroke Patients Only) Modified Rankin (Stroke Patients Only) Pre-Morbid Rankin Score: Moderate disability Modified Rankin: Severe disability     Balance Overall balance assessment: Needs assistance Sitting-balance support: Feet supported;Single extremity supported Sitting balance-Leahy Scale: Poor Sitting balance - Comments: Reliant on external assist to maintain static sitting balance at EOB; LOB in all directions with tendency for right lateral lean; improvement noted this date. Postural control: Left lateral lean Standing balance support: Bilateral upper extremity supported;During functional activity Standing balance-Leahy Scale: Poor Standing balance comment: +2 external assist, max multimodal cues and L knee block without ambulation equipment this date. Patient with great difficulty finding and maintaining midline.                            Cognition Arousal/Alertness: Awake/alert Behavior During Therapy: WFL for tasks assessed/performed Overall Cognitive Status: No family/caregiver present to determine baseline cognitive functioning  General Comments: Follows 1-step verbal commands with good accuracy but incr time; A&Ox3; perseverates on use of maxisky and calling his significant other.      Exercises      General Comments General comments (skin integrity,  edema, etc.): Focus of session on standing without ambulation equipment with +2 assist. Patient returned to supine at conclusion of session in prep for PEG palcement procedure. Patient maintained SpO2 94-96% on 4L O2 at rest and with activity. Breathing treatment given prior to entry.      Pertinent Vitals/Pain Pain Assessment: No/denies pain Faces Pain Scale: No hurt    Home Living                      Prior Function            PT Goals (current goals can now be found in the care plan section) Acute Rehab PT Goals Patient Stated Goal: return home after rehab PT Goal Formulation: With patient Time For Goal Achievement: 09/25/20 Potential to Achieve Goals: Good Progress towards PT goals: Progressing toward goals    Frequency    Min 4X/week      PT Plan Current plan remains appropriate    Co-evaluation              AM-PAC PT "6 Clicks" Mobility   Outcome Measure  Help needed turning from your back to your side while in a flat bed without using bedrails?: A Little Help needed moving from lying on your back to sitting on the side of a flat bed without using bedrails?: Total Help needed moving to and from a bed to a chair (including a wheelchair)?: Total Help needed standing up from a chair using your arms (e.g., wheelchair or bedside chair)?: Total Help needed to walk in hospital room?: Total Help needed climbing 3-5 steps with a railing? : Total 6 Click Score: 8    End of Session Equipment Utilized During Treatment: Oxygen;Gait belt Activity Tolerance: Patient tolerated treatment well Patient left: with call bell/phone within reach;in bed;with bed alarm set (chair posiion) Nurse Communication: Mobility status;Other (comment) (maxisky lift not working therefore did not transfer OOB) PT Visit Diagnosis: Unsteadiness on feet (R26.81);Other abnormalities of gait and mobility (R26.89);Muscle weakness (generalized) (M62.81)     Time: 1962-2297 PT Time  Calculation (min) (ACUTE ONLY): 33 min  Charges:  $Therapeutic Activity: 23-37 mins                      Brent [name], PT Pager [phone]    Rexanne Mano 09/24/2020, 1:07 PM

## 2020-09-24 NOTE — Procedures (Signed)
  Procedure: Perc gastrostomy tube placement 20f EBL:   minimal Complications:  none immediate  See full dictation in [facility] PACS.  D. Matilde Markie MD Main # [phone] Pager  [phone]    

## 2020-09-25 DIAGNOSIS — I639 Cerebral infarction, unspecified: Secondary | ICD-10-CM | POA: Diagnosis not present

## 2020-09-25 DIAGNOSIS — A419 Sepsis, unspecified organism: Secondary | ICD-10-CM | POA: Diagnosis not present

## 2020-09-25 DIAGNOSIS — J9601 Acute respiratory failure with hypoxia: Secondary | ICD-10-CM | POA: Diagnosis not present

## 2020-09-25 DIAGNOSIS — J690 Pneumonitis due to inhalation of food and vomit: Secondary | ICD-10-CM | POA: Diagnosis not present

## 2020-09-25 LAB — CBC WITH DIFFERENTIAL/PLATELET
Abs Immature Granulocytes: 0.07 10*3/uL (ref 0.00–0.07)
Basophils Absolute: 0.1 10*3/uL (ref 0.0–0.1)
Basophils Relative: 1 %
Eosinophils Absolute: 0.4 10*3/uL (ref 0.0–0.5)
Eosinophils Relative: 3 %
HCT: 40 % (ref 39.0–52.0)
Hemoglobin: 13.3 g/dL (ref 13.0–17.0)
Immature Granulocytes: 1 %
Lymphocytes Relative: 16 %
Lymphs Abs: 2.1 10*3/uL (ref 0.7–4.0)
MCH: 32 pg (ref 26.0–34.0)
MCHC: 33.3 g/dL (ref 30.0–36.0)
MCV: 96.4 fL (ref 80.0–100.0)
Monocytes Absolute: 0.9 10*3/uL (ref 0.1–1.0)
Monocytes Relative: 7 %
Neutro Abs: 9.9 10*3/uL — ABNORMAL HIGH (ref 1.7–7.7)
Neutrophils Relative %: 72 %
Platelets: 268 10*3/uL (ref 150–400)
RBC: 4.15 MIL/uL — ABNORMAL LOW (ref 4.22–5.81)
RDW: 13 % (ref 11.5–15.5)
WBC: 13.5 10*3/uL — ABNORMAL HIGH (ref 4.0–10.5)
nRBC: 0 % (ref 0.0–0.2)

## 2020-09-25 LAB — GLUCOSE, CAPILLARY
Glucose-Capillary: 255 mg/dL — ABNORMAL HIGH (ref 70–99)
Glucose-Capillary: 178 mg/dL — ABNORMAL HIGH (ref 70–99)
Glucose-Capillary: 211 mg/dL — ABNORMAL HIGH (ref 70–99)
Glucose-Capillary: 116 mg/dL — ABNORMAL HIGH (ref 70–99)
Glucose-Capillary: 238 mg/dL — ABNORMAL HIGH (ref 70–99)
Glucose-Capillary: 247 mg/dL — ABNORMAL HIGH (ref 70–99)
Glucose-Capillary: 212 mg/dL — ABNORMAL HIGH (ref 70–99)

## 2020-09-25 MED ORDER — IPRATROPIUM-ALBUTEROL 0.5-2.5 (3) MG/3ML IN SOLN
3.0000 mL | Freq: Two times a day (BID) | RESPIRATORY_TRACT | Status: DC
  Administered 2020-09-25 – 2020-09-27 (×4): 3 mL via RESPIRATORY_TRACT
  Filled 2020-09-25 (×4): qty 3

## 2020-09-25 MED ORDER — ALPRAZOLAM 0.25 MG PO TABS
0.2500 mg | ORAL_TABLET | Freq: Three times a day (TID) | ORAL | Status: DC | PRN
  Administered 2020-09-25 – 2020-09-26 (×3): 0.25 mg via ORAL
  Filled 2020-09-25 (×4): qty 1

## 2020-09-25 NOTE — Progress Notes (Signed)
Inpatient Rehabilitation Admissions Coordinator   I met with patient at bedside with his nurse. He is constantly requesting something to drink and has to be redirected that he is currently NPO. I contacted his daughter Jonelle Sidle by phone, to discuss goals and expectations of a possible Cir admit then for him to return home with his girlfriend of 52 years, [name]. She is in agreement to this plan. I will begin Auth and follow up once payor has made their determination.  Danne Baxter, RN, MSN Rehab Admissions Coordinator [phone] 09/25/2020 12:18 PM

## 2020-09-25 NOTE — Progress Notes (Addendum)
  Speech Language Pathology Treatment: Dysphagia  Patient Details Name: Brent [name] MRN: [id number] DOB: Jul 01, 1965 Today's Date: 09/25/2020 Time: 9390-3009 SLP Time Calculation (min) (ACUTE ONLY): 30 min  Assessment / Plan / Recommendation Clinical Impression  Pt seated in recliner, asking for water, milk, and applesauce.  Pt dysarthric, hyper-nasal. Provided with toothbrush/paste and pt was able to brush his own teeth with assist to re-wet brush and cues needed to expectorate. After oral care, he was provided with ice chips  - presented with active mastication, multiple swallows per bolus, and intermittent coughing, expectorating dried secretions.  Sp02 remained 89-92% on 4L [place].  He began to c/o his stomach hurting, so we ceased ice chip trials.  He immediately asked for more, as well as milk.  Pt required constant reminders with limited carryover of information.   Recommend:  Encourage pt to brush his own teeth (don't allow him to swish/spit, he tends to swallow the water rather than expectorating it) After oral care, he may have 4-5 ice chips (he may cough intermittently)  SLP will continue to follow for swallowing, as well as cognitive tx.   HPI HPI: Pt is a 55 y/o male who presented to the ED with SOB and was noted to have L hemiparesis and significant dysarthria. Patient admitted to [facility] with acute respiratory failure with hypoxia secondary to PNA, COPD exacerbation and sepsis. Imaging revealed R ACA CVA x2 within R cingulate gyrus/callosal body, R parietal lobe subcortical white matter infarct and questionable acute punctate infarct within L frontal lobe subcortical white matter. Transferred to ICU at [facility] 7/31 due to ongoing respiratory failure with complete opacification of left hemithorax. PMH: cocaine and tobacco use disorder, lumbago, HTN, HLD, DMII and Hx of CVA. Pt with known dysphagia (since first MBS 11/2018) and noncompliance with recommendations. MBS completed in June with  recommendation for regular textures and honey thick liquids. MBS 7/28: oropharyngeal dysphagia with consistent small amounts of aspiration which are not cleared by pt following weak delayed cough. despite. Weak bolus manipulation, reduced bolus cohesion, a pharyngeal delay, and reduced laryngeal vestibule closure. An NPO status with alternate means of nutrition was recommended at that time. Pt/family have agreed to PEG, but as of 8/4, placement has been deferred due to pt's high risk for intubation.      SLP Plan  Continue with current plan of care       Recommendations  Diet recommendations: NPO                Oral Care Recommendations: Oral care prior to ice chip/H20;Oral care QID Follow up Recommendations: Skilled Nursing facility SLP Visit Diagnosis: Dysphagia, oropharyngeal phase (R13.12) Plan: Continue with current plan of care       GO              Brent [name] L. Samson Frederic, MA CCC/SLP Acute Rehabilitation Services Office number [phone] Pager [phone]   Carolan Shiver 09/25/2020, 4:13 PM

## 2020-09-25 NOTE — Progress Notes (Signed)
PROGRESS NOTE    Brent [name]   WUJ:[id number]  DOB: December 20, 1965  PCP: Beatrix FettersKotturi, Vinay K, MD    DOA: 09/11/2020 LOS: 14   Assessment & Plan   Active Problems:   Hypertriglyceridemia   Tobacco abuse   COPD with acute exacerbation (HCC)   Cocaine abuse (HCC)   CVA (cerebral vascular accident) (HCC)   Acute respiratory failure with hypoxia (HCC)   Aspiration pneumonitis (HCC)   Dysarthria   Sepsis due to undetermined organism (HCC)   Goals of care, counseling/discussion   Sepsis secondary to aspiration pneumonia, multifocal pneumonia -completed 14 days antibiotics. --Monitor clinically --N.p.o. status with tube feeds by PEG  Acute respiratory failure with hypoxia -due to pneumonia.  Today on 2 L/min, down from 4.  Continue weaning oxygen, maintain sats above 90%.  Acute anxiety -appears situational in the setting of n.p.o. status and significant thirst. -- Low-dose as needed Xanax per tube while inpatient would avoid in the long-term  Acute stroke with left hemiparesis -continue aspirin, resume Plavix today.  Continue PT and OT.  CIR recommended on discharge, evaluation underway.  Dysphagia -SLP following.  Patient is strict NPO.  Status post PEG tube placement by IR on 8/9.  Dietitian following for tube feeds which have been started.  Patient appears tolerating tube feeds well.  Continue to evaluate for swallow improvement.  Nutrition problem: Inadequate oral intake Etiology: Dysphagia due to acute ischemic/embolic stroke Sign/symptoms: N.p.o. status per SLP Intervention: Tube feeds via PEG tube per dietitian Body mass index is 24.7 kg/m.   COPD with acute exacerbation -continue bronchodilators and prednisone.  Outpatient pulmonary follow-up for PFTs.  Type 2 diabetes -continue Levemir and sliding scale NovoLog  Chronic comorbidities: Hypertension, hyperlipidemia, seizure disorder, chronic back pain, cocaine abuse, tobacco use disorder.  Patient BMI: Body mass  index is 24.7 kg/m.   DVT prophylaxis: enoxaparin (LOVENOX) injection 40 mg Start: 09/24/20 2200   Diet:  Diet Orders (From admission, onward)     Start     Ordered   09/24/20 2301  Diet NPO time specified Except for: Ice Chips  Diet effective now       Question:  Except for  Answer:  Ice Chips   09/24/20 2300              Code Status: Full Code   Brief Narrative / Hospital Course to Date:     Medical records reviewed and are as summarized below:   Brent [name] is a 55 y.o. male with past medical history significant for hypertension, type 2 diabetes mellitus, hyperlipidemia, cocaine abuse, tobacco use disorder, seizure disorder, OSA, GERD, stroke, who presented to [facility] with shortness of breath, cough, slurred speech, left-sided weakness and facial droop on 09/11/2020.   He was found to have sepsis secondary to aspiration pneumonia, COPD exacerbation, acute hypoxic respiratory failure and acute stroke.  [facility].   He was treated with empiric IV antibiotics, steroids and bronchodilators.  He required heated humidified high flow oxygen for oxygenation.  NG tube was placed for enteral nutrition because of dysphagia.   PEG tube was placed by IR on 8/9 for severe dysphagia. Tube feeds initiated and patient tolerating.  Subjective 09/25/20    Pt seen this AM, bedside RN present.  Patient repeatedly requesting water drink.  He perseverates on this, and does not otherwise express concerns or complaints.  Per RN, pt very restless and anxious, pulled off and  broke cardiac leads, trying to get up to go get water, etc.  He appears quite anxious.  Otherwise no acute events reported.    Disposition Plan & Communication   Status is: Inpatient  Remains inpatient appropriate because: Requires CIR for rehab, evaluation pending  Dispo: The patient is from: Home              Anticipated d/c is to: CIR               Patient currently is not medically stable to d/c.   Difficult to place patient No   Consults, Procedures, Significant Events   Consultants:  PCCM Interventional radiology  Procedures:  Core track feeding tube placement 09/16/2020 PEG tube placed 09/25/2018 few  Antimicrobials:  Anti-infectives (From admission, onward)    Start     Dose/Rate Route Frequency Ordered Stop   09/24/20 1020  ceFAZolin (ANCEF) IVPB 2g/100 mL premix        over 30 Minutes Intravenous Continuous PRN 09/24/20 1027 09/24/20 1020   09/24/20 0000  ceFAZolin (ANCEF) IVPB 2g/100 mL premix        2 g 200 mL/hr over 30 Minutes Intravenous To Radiology 09/23/20 1618 09/25/20 0000   09/18/20 0500  vancomycin (VANCOREADY) IVPB 1250 mg/250 mL  Status:  Discontinued        1,250 mg 166.7 mL/hr over 90 Minutes Intravenous Every 12 hours 09/17/20 1604 09/18/20 1304   09/18/20 0000  ceFAZolin (ANCEF) IVPB 2g/100 mL premix  Status:  Discontinued        2 g 200 mL/hr over 30 Minutes Intravenous To Radiology 09/17/20 1017 09/17/20 1021   09/17/20 1700  vancomycin (VANCOREADY) IVPB 1750 mg/350 mL        1,750 mg 175 mL/hr over 120 Minutes Intravenous  Once 09/17/20 1601 09/17/20 1907   09/11/20 1600  piperacillin-tazobactam (ZOSYN) IVPB 3.375 g  Status:  Discontinued        3.375 g 12.5 mL/hr over 240 Minutes Intravenous Every 8 hours 09/11/20 0740 09/24/20 1226   09/11/20 0815  azithromycin (ZITHROMAX) 500 mg in sodium chloride 0.9 % 250 mL IVPB  Status:  Discontinued        500 mg 250 mL/hr over 60 Minutes Intravenous Every 24 hours 09/11/20 0811 09/15/20 1420   09/11/20 0815  piperacillin-tazobactam (ZOSYN) IVPB 3.375 g        3.375 g 100 mL/hr over 30 Minutes Intravenous  Once 09/11/20 0815 09/11/20 1218         Micro    Objective   Vitals:   09/25/20 0740 09/25/20 0806 09/25/20 0807 09/25/20 1203  BP: 129/87   (!) 150/83  Pulse: 94   100  Resp: 15   17  Temp:    (!) 97.4 F (36.3 C)  TempSrc:    Oral   SpO2: 91% 92% 92% 92%  Weight:      Height:        Intake/Output Summary (Last 24 hours) at 09/25/2020 1640 Last data filed at 09/25/2020 1200 Gross per 24 hour  Intake 670 ml  Output 1550 ml  Net -880 ml   Filed Weights   09/23/20 0500 09/24/20 0500 09/25/20 0500  Weight: 82.4 kg 82.7 kg 82.6 kg    Physical Exam:  General exam: awake, alert, no acute distress HEENT: Dry mucus membranes, hearing grossly normal  Respiratory system: CTAB, no wheezes, rales or rhonchi, normal respiratory effort. Cardiovascular system: normal S1/S2, RRR, no pedal edema.   Gastrointestinal system: soft,  NT, ND, PEG tube in place with continuous tube feeds running. Central nervous system: Left-sided weakness, slurred speech Extremities: no edema, normal tone Psychiatry: normal mood, congruent affect, poor judgment and insight  Labs   Data Reviewed: I have personally reviewed following labs and imaging studies  CBC: Recent Labs  Lab 09/19/20 0223 09/20/20 0238 09/21/20 0346 09/24/20 0129 09/25/20 0638  WBC 10.8* 10.5 13.0* 17.6* 13.5*  NEUTROABS  --   --   --   --  9.9*  HGB 13.7 14.2 13.7 14.1 13.3  HCT 41.1 42.1 41.6 43.1 40.0  MCV 96.0 96.1 94.8 95.6 96.4  PLT 343 322 300 251 268   Basic Metabolic Panel: Recent Labs  Lab 09/19/20 0223 09/20/20 0238 09/21/20 0346 09/23/20 0332  NA 143 145 143 139  K 3.7 3.9 4.1 4.0  CL 107 111 108 105  CO2 27 24 27 24   GLUCOSE 197* 206* 209* 214*  BUN 21* 19 18 23*  CREATININE 0.88 0.87 0.85 0.97  CALCIUM 9.4 9.3 9.3 9.5  MG  --  1.6* 1.7  --   PHOS  --   --  3.3  --    GFR: Estimated Creatinine Clearance: 94.4 mL/min (by C-G formula based on SCr of 0.97 mg/dL). Liver Function Tests: No results for input(s): AST, ALT, ALKPHOS, BILITOT, PROT, ALBUMIN in the last 168 hours. No results for input(s): LIPASE, AMYLASE in the last 168 hours. No results for input(s): AMMONIA in the last 168 hours. Coagulation Profile: Recent Labs  Lab  09/23/20 1346  INR 1.0   Cardiac Enzymes: No results for input(s): CKTOTAL, CKMB, CKMBINDEX, TROPONINI in the last 168 hours. BNP (last 3 results) No results for input(s): PROBNP in the last 8760 hours. HbA1C: No results for input(s): HGBA1C in the last 72 hours. CBG: Recent Labs  Lab 09/25/20 0314 09/25/20 0406 09/25/20 0753 09/25/20 1154 09/25/20 1615  GLUCAP 255* 178* 211* 116* 238*   Lipid Profile: No results for input(s): CHOL, HDL, LDLCALC, TRIG, CHOLHDL, LDLDIRECT in the last 72 hours. Thyroid Function Tests: No results for input(s): TSH, T4TOTAL, FREET4, T3FREE, THYROIDAB in the last 72 hours. Anemia Panel: No results for input(s): VITAMINB12, FOLATE, FERRITIN, TIBC, IRON, RETICCTPCT in the last 72 hours. Sepsis Labs: No results for input(s): PROCALCITON, LATICACIDVEN in the last 168 hours.  Recent Results (from the past 240 hour(s))  Culture, Respiratory w Gram Stain     Status: None   Collection Time: 09/17/20  8:27 AM   Specimen: Tracheal Aspirate; Respiratory  Result Value Ref Range Status   Specimen Description TRACHEAL ASPIRATE  Final   Special Requests NONE  Final   Gram Stain   Final    NO WBC SEEN ABUNDANT SQUAMOUS EPITHELIAL CELLS PRESENT RARE GRAM POSITIVE COCCI IN PAIRS    Culture   Final    FEW Normal respiratory flora-no Staph aureus or Pseudomonas seen Performed at [facility], [address], [place], [place]    Report Status 09/19/2020 FINAL  Final  MRSA Next Gen by PCR, Nasal     Status: None   Collection Time: 09/17/20  4:00 PM   Specimen: Nasal Mucosa; Nasal Swab  Result Value Ref Range Status   MRSA by PCR Next Gen NOT DETECTED NOT DETECTED Final    Comment: (NOTE) The GeneXpert MRSA Assay (FDA approved for NASAL specimens only), is one component of a comprehensive MRSA colonization surveillance program. It is not intended to diagnose MRSA infection nor to guide  or monitor treatment for MRSA infections. Test  performance is not FDA approved in patients less than 54 years old. Performed at [facility], [address], [place], [place] [address]       Imaging Studies   IR GASTROSTOMY TUBE MOD SED  Result Date: 09/25/2020 CLINICAL DATA:  Shortness of breath, dysphagia with aspiration, needs enteral feeding support EXAM: PERC PLACEMENT GASTROSTOMY FLUOROSCOPY TIME:  3 minutes 24 seconds TECHNIQUE: The procedure, risks, benefits, and alternatives were explained to the patient. Questions regarding the procedure were encouraged and answered. The patient understands and consents to the procedure. As antibiotic prophylaxis, cefazolin 2 g was ordered pre-procedure and administered intravenously within one hour of incision. A safe percutaneous approach was confirmed on previous day's CT chest. A 5 French angiographic catheter was placed as orogastric tube. The upper abdomen was prepped with Betadine, draped in usual sterile fashion, and infiltrated locally with 1% lidocaine. Intravenous Fentanyl and Versed 1.5mg  were administered as conscious sedation during continuous monitoring of the patient's level of consciousness and physiological / cardiorespiratory status by the radiology RN, with a total moderate sedation time of 8 minutes. 0.5 mg glucagon given IV to facilitate gastric distention.Stomach was insufflated using air through the orogastric tube. An 72 French sheath needle was advanced percutaneously into the gastric lumen under fluoroscopy. Gas could be aspirated and a small contrast injection confirmed intraluminal spread. The sheath was exchanged over a guidewire for a 9 [place] vascular sheath, through which the snare device was advanced and used to snare a guidewire passed through the orogastric tube. This was withdrawn, and the snare attached to the 20 French pull-through gastrostomy tube, which was advanced antegrade, positioned with the internal bumper securing the anterior gastric wall to the  anterior abdominal wall. Small contrast injection confirms appropriate positioning. The external bumper was applied and the catheter was flushed. COMPLICATIONS: COMPLICATIONS none IMPRESSION: 1. Technically successful 20 French pull-through gastrostomy placement under fluoroscopy. Electronically Signed   By: Corlis Leak M.D.   On: 09/25/2020 07:28     Medications   Scheduled Meds:  arformoterol  15 mcg Nebulization BID   aspirin  81 mg Per Tube Daily   budesonide (PULMICORT) nebulizer solution  0.5 mg Nebulization BID   carBAMazepine  200 mg Per Tube Q6H   chlorhexidine  15 mL Mouth Rinse BID   Chlorhexidine Gluconate Cloth  6 each Topical Q2200   clopidogrel  75 mg Per Tube Daily   enoxaparin (LOVENOX) injection  40 mg Subcutaneous Q24H   feeding supplement (PROSource TF)  45 mL Per Tube Daily   fenofibrate  160 mg Per Tube Daily   guaiFENesin  10 mL Per Tube Q4H   insulin aspart  0-20 Units Subcutaneous Q4H   insulin aspart  3 Units Subcutaneous Q4H   insulin detemir  30 Units Subcutaneous BID   ipratropium-albuterol  3 mL Nebulization BID   mouth rinse  15 mL Mouth Rinse q12n4p   polyethylene glycol  17 g Per Tube Daily   rosuvastatin  20 mg Per Tube Daily   Continuous Infusions:  sodium chloride Stopped (09/19/20 0355)   sodium chloride 10 mL/hr at 09/24/20 1800   feeding supplement (OSMOLITE 1.5 CAL) 45 mL/hr at 09/25/20 1207       LOS: 14 days    Time spent: 30 minutes    Pennie Banter, DO [facility]  09/25/2020, 4:40 PM      If 7PM-7AM, please contact night-coverage. How to contact  the [facility] Attending or Consulting provider 7A - 7P or covering provider during after hours 7P -7A, for this patient?    Check the care team in [facility] and look for a) attending/consulting TR[facility] provider listed and b) the [facility] team listed Log into [email or web address] and use [place]'s universal password to access. If you do not have the password, please contact the hospital  operator. Locate the [facility] provider you are looking for under [facility] and page to a number that you can be directly reached. If you still have difficulty reaching the provider, please page the [facility] (Director on Call) for the Hospitalists listed on amion for assistance.

## 2020-09-25 NOTE — Progress Notes (Signed)
Physical Therapy Treatment Patient Details Name: Brent [name] MRN: [id number] DOB: [birth year] Today's Date: 09/25/2020    History of Present Illness 55 y.o. male presenting to ED with SOB, slurred speech and L sided weakness. Patient admitted with acute respiratory failure with hypoxia secondary to aspiration PNA, COPD exacerbation and sepsis. Imaging (+) R ACA CVA, R parietal lobe subcortical white matter infarct and ? acute punctate infarct within L frontal lobe. Transferred to ICU at [facility] 7/31 due to ongoing respiratory failure with complete opacification of left hemithorax.   PMHx significant for cocaine and tobacco use disorder, lumbago, HTN, DMII and CVA.    PT Comments    Pt stood and transferred twice to the [facility] and recliner chair with two person assist, third needed for pericare after BM in [facility].  Pt continues to have impulsivity and significant safety/awareness issues.  He is progressing with mobility and transfers and remains appropriate for intensive multi disciplinary rehab at discharge.  PT will continue to follow acutely for safe mobility progression.  Follow Up Recommendations  CIR     Equipment Recommendations  None recommended by PT    Recommendations for Other Services       Precautions / Restrictions Precautions Precautions: Fall Precaution Comments: 4L O2 via [place], L hemi    Mobility  Bed Mobility Overal bed mobility: Needs Assistance Bed Mobility: Rolling;Sidelying to Sit Rolling: Min assist Sidelying to sit: Mod assist       General bed mobility comments: Min assist to roll to the left using his good arm to pull on bedrail, mod assist at left leg and trunk to push up to sitting EOB and we immediately flug to the far right to the foot board with R hand, let go and fell left.    Transfers Overall transfer level: Needs assistance Equipment used: 1 person hand held assist Transfers: Sit to/from [employer] Sit to Stand: Mod assist;+2  physical assistance;From elevated surface Stand pivot transfers: Mod assist;+2 physical assistance;From elevated surface       General transfer comment: Heavy mod assist to come to standing with two people supporting trunk and blocking L knee/extending hips, heavy mod assist to take pivotal steps to the [facility] and then to the recliner chair on pt's right side.  Ambulation/Gait             General Gait Details: unable at this time.   Stairs             Wheelchair Mobility    Modified Rankin (Stroke Patients Only) Modified Rankin (Stroke Patients Only) Pre-Morbid Rankin Score: Moderate disability Modified Rankin: Severe disability     Balance Overall balance assessment: Needs assistance Sitting-balance support: Feet supported;No upper extremity supported Sitting balance-Leahy Scale: Poor Sitting balance - Comments: leans/pulls to the right his strong side, cues to find midline, and cannot maintain sitting without external support and certainly not without his own R hand held assist. Postural control: Left lateral lean Standing balance support: Bilateral upper extremity supported;During functional activity Standing balance-Leahy Scale: Poor Standing balance comment: +2 heavy mod assist in standing.                            Cognition Arousal/Alertness: Awake/alert Behavior During Therapy: Impulsive;Restless Overall Cognitive Status: Impaired/Different from baseline Area of Impairment: Attention;Memory;Following commands;Safety/judgement;Awareness;Problem solving                   Current Attention Level: Sustained Memory: Decreased  short-term memory;Decreased recall of precautions Following Commands: Follows one step commands inconsistently Safety/Judgement: Decreased awareness of safety;Decreased awareness of deficits Awareness: Emergent Problem Solving: Difficulty sequencing;Requires verbal cues;Requires tactile cues General Comments: Pt able  to follow basic commands within his physical ability well, often quickly without awareness of his deficits or how they relate to safety.  After it took two people to get him up I asked him at the end of the session if he could safely get himself back to bed and he said, "yep".      Exercises      General Comments General comments (skin integrity, edema, etc.): O2 via [place] throughout with O2 sats 90-94, HR 100s-129 max observed.  BP stable 130s/80s      Pertinent Vitals/Pain Pain Assessment: No/denies pain    Home Living                      Prior Function            PT Goals (current goals can now be found in the care plan section) Progress towards PT goals: Progressing toward goals    Frequency    Min 4X/week      PT Plan Current plan remains appropriate    Co-evaluation              AM-PAC PT "6 Clicks" Mobility   Outcome Measure  Help needed turning from your back to your side while in a flat bed without using bedrails?: A Little Help needed moving from lying on your back to sitting on the side of a flat bed without using bedrails?: A Lot Help needed moving to and from a bed to a chair (including a wheelchair)?: Total Help needed standing up from a chair using your arms (e.g., wheelchair or bedside chair)?: Total Help needed to walk in hospital room?: Total Help needed climbing 3-5 steps with a railing? : Total 6 Click Score: 9    End of Session Equipment Utilized During Treatment: Gait belt;Oxygen Activity Tolerance: Patient limited by fatigue Patient left: in chair;with call bell/phone within reach;with chair alarm set Nurse Communication: Mobility status PT Visit Diagnosis: Unsteadiness on feet (R26.81);Other abnormalities of gait and mobility (R26.89);Muscle weakness (generalized) (M62.81)     Time: 0932-6712 PT Time Calculation (min) (ACUTE ONLY): 37 min  Charges:  $Therapeutic Activity: 8-22 mins $Neuromuscular Re-education: 8-22  mins                     Corinna Capra, PT, DPT  Acute Rehabilitation Ortho Tech Supervisor [phone] pager [phone]) [phone] office

## 2020-09-25 NOTE — Progress Notes (Signed)
DELLAS GUARD is a 55 y.o. male s/p image guided gastrostomy tube placement with Dr. Deanne Coffer yesterday.   Minimal blood noted at the site. Site otherwise clean and dry, no sign of acute infection.  A split gauze was placed under the bumper, would not recommend putting more than one split gaze under the bumper to prevent leakage.  The split gauze was taped.   Ok to use the gastrostomy tube.   Please call IR for questions and concerns regarding the G tube.    Lynann Bologna Floyce Bujak PA-C 09/25/2020 11:09 AM

## 2020-09-26 ENCOUNTER — Inpatient Hospital Stay (HOSPITAL_COMMUNITY): Payer: Medicaid Other

## 2020-09-26 LAB — HEMOGLOBIN AND HEMATOCRIT, BLOOD
HCT: 36.8 % — ABNORMAL LOW (ref 39.0–52.0)
Hemoglobin: 12.4 g/dL — ABNORMAL LOW (ref 13.0–17.0)

## 2020-09-26 LAB — GLUCOSE, CAPILLARY
Glucose-Capillary: 111 mg/dL — ABNORMAL HIGH (ref 70–99)
Glucose-Capillary: 116 mg/dL — ABNORMAL HIGH (ref 70–99)
Glucose-Capillary: 173 mg/dL — ABNORMAL HIGH (ref 70–99)
Glucose-Capillary: 235 mg/dL — ABNORMAL HIGH (ref 70–99)
Glucose-Capillary: 246 mg/dL — ABNORMAL HIGH (ref 70–99)
Glucose-Capillary: 260 mg/dL — ABNORMAL HIGH (ref 70–99)

## 2020-09-26 MED ORDER — CLOPIDOGREL BISULFATE 75 MG PO TABS: 75.0000 mg | ORAL_TABLET | Freq: Every day | ORAL | Status: DC

## 2020-09-26 MED ORDER — INSULIN DETEMIR 100 UNIT/ML ~~LOC~~ SOLN: 30.0000 [IU] | Freq: Two times a day (BID) | SUBCUTANEOUS | 11 refills | Status: DC

## 2020-09-26 MED ORDER — IPRATROPIUM-ALBUTEROL 0.5-2.5 (3) MG/3ML IN SOLN: 3.0000 mL | Freq: Two times a day (BID) | RESPIRATORY_TRACT | Status: DC

## 2020-09-26 MED ORDER — ORAL CARE MOUTH RINSE: 15.0000 mL | Freq: Two times a day (BID) | OROMUCOSAL | 0 refills | Status: DC

## 2020-09-26 MED ORDER — PROSOURCE TF PO LIQD: 45.0000 mL | Freq: Every day | ORAL | Status: DC

## 2020-09-26 MED ORDER — ACETAMINOPHEN 160 MG/5ML PO SOLN: 650.0000 mg | ORAL | 0 refills | Status: DC | PRN

## 2020-09-26 MED ORDER — CHLORHEXIDINE GLUCONATE 0.12 % MT SOLN
15.0000 mL | Freq: Two times a day (BID) | OROMUCOSAL | 0 refills | Status: DC
Start: 2020-09-26 — End: 2020-10-18

## 2020-09-26 MED ORDER — POLYETHYLENE GLYCOL 3350 17 G PO PACK: 17.0000 g | PACK | Freq: Every day | ORAL | 0 refills | Status: DC

## 2020-09-26 MED ORDER — IOHEXOL 350 MG/ML SOLN
80.0000 mL | Freq: Once | INTRAVENOUS | Status: AC | PRN
  Administered 2020-09-26: 80 mL via INTRAVENOUS

## 2020-09-26 MED ORDER — ASPIRIN 81 MG PO CHEW
81.0000 mg | CHEWABLE_TABLET | Freq: Every day | ORAL | Status: DC
Start: 2020-09-27 — End: 2020-10-18

## 2020-09-26 MED ORDER — ZINC OXIDE 40 % EX OINT: TOPICAL_OINTMENT | Freq: Every day | CUTANEOUS | 0 refills | Status: DC | PRN

## 2020-09-26 MED ORDER — CARBAMAZEPINE 100 MG/5ML PO SUSP: 200.0000 mg | Freq: Four times a day (QID) | ORAL | 12 refills | Status: DC

## 2020-09-26 MED ORDER — MELATONIN 3 MG PO TABS: 3.0000 mg | ORAL_TABLET | Freq: Every evening | ORAL | 0 refills | Status: DC | PRN

## 2020-09-26 MED ORDER — OSMOLITE 1.5 CAL PO LIQD: 1000.0000 mL | ORAL | 0 refills | Status: DC

## 2020-09-26 MED ORDER — ARFORMOTEROL TARTRATE 15 MCG/2ML IN NEBU
15.0000 ug | INHALATION_SOLUTION | Freq: Two times a day (BID) | RESPIRATORY_TRACT | Status: DC
Start: 2020-09-26 — End: 2020-10-18

## 2020-09-26 MED ORDER — GUAIFENESIN 100 MG/5ML PO SOLN
10.0000 mL | ORAL | 0 refills | Status: DC
Start: 2020-09-26 — End: 2020-10-18

## 2020-09-26 MED ORDER — HYDROCODONE-ACETAMINOPHEN 5-325 MG PO TABS: 1.0000 | ORAL_TABLET | ORAL | 0 refills | Status: DC | PRN

## 2020-09-26 MED ORDER — BUDESONIDE 0.5 MG/2ML IN SUSP: 0.5000 mg | Freq: Two times a day (BID) | RESPIRATORY_TRACT | 12 refills | Status: DC

## 2020-09-26 MED ORDER — ALPRAZOLAM 0.25 MG PO TABS: 0.2500 mg | ORAL_TABLET | Freq: Three times a day (TID) | ORAL | 0 refills | Status: DC | PRN

## 2020-09-26 MED ORDER — INSULIN ASPART 100 UNIT/ML IJ SOLN: 0.0000 [IU] | INTRAMUSCULAR | 11 refills | Status: DC

## 2020-09-26 MED ORDER — NITROGLYCERIN 0.4 MG SL SUBL: 0.4000 mg | SUBLINGUAL_TABLET | SUBLINGUAL | 12 refills | Status: DC | PRN

## 2020-09-26 MED ORDER — INSULIN ASPART 100 UNIT/ML IJ SOLN: 3.0000 [IU] | INTRAMUSCULAR | 11 refills | Status: DC

## 2020-09-26 NOTE — Progress Notes (Signed)
Inpatient Rehabilitation Admissions Coordinator   I have insurance approval and CIR bed to admit patient to today. I contacted his significant other, Clydie Braun, by phone and she is aware and in agreement. She will let his daughter, [name] know plan for admit today.Acute team and TOC made aware.  Ottie Glazier, RN, MSN Rehab Admissions Coordinator [phone] 09/26/2020 1:30 PM

## 2020-09-26 NOTE — PMR Pre-admission (Addendum)
PMR Admission Coordinator Pre-Admission Assessment  Patient: Brent [name] is an 55 y.o., male MRN: [id number] DOB: 12-04-1965 Height: 6' (182.9 cm)Weight: 83 kg  Insurance Information HMO:     PPO:      PCP:      IPA:      80/20:      OTHER:  PRIMARY: [employer] [facility] [place] medicaid      Policy#: [id number]      Subscriber: pt CM Name: Marijo Sanes      Phone#: [phone]     Fax#: [phone] Pre-Cert#: [id number] approved until 10/03/20      Employer:  Benefits:  Phone #: [phone]     Name: 8/11 Eff. Date: 08/2019     Deduct: none      Out of Pocket Max: none CIR: per medicaid guidelines        Financial Counselor:       Phone#:   The "Data Collection Information Summary" for patients in Inpatient Rehabilitation Facilities with attached "Privacy Act [place] Records" was provided and verbally reviewed with: N/A  Emergency Contact Information Contact Information     Name Relation Home Work Mobile   [place] Significant other [place]   [facility] Mother [phone]    [place],Tiffany Daughter   [phone]       Current Medical History  Patient Admitting Diagnosis:CVA  History of Present Illness: 55 year old male with medical history of HTN, HLD, type 2 DM, cocaine abuse, tobacco abuse  and recent CVA with a 2 to 3 day history of slurred speech, left hemiparesis and SOB. Presented to [facility] on 09/11/2020 with cough and worsening of SOB per significant other. Found to have sepsis secondary to aspiration PNA, COPD exacerbation, acute hypoxic respiratory failure and acute CVA. Transferred to [facility] for further respiratory management.   He was treated with empiric IV antibiotics, steroids and bronchodilators. He required humidified high flow oxygenation, and NG tube placement for enteral nutrition because of severe dysphagia. He has completed 14 days of antibiotics and is NPO with tube feeds. Now on 2 liters [place].  Continue aspirin for acute CVA and resuming Plavix. Status post PEG placement by OR on 8/9. Tolerating tube feeds now. Plan outpatient pulmonary follow up for PFTS. Continue Levemir and SS Novolog. Lovenox for DVT prophylaxis.  Complete NIHSS TOTAL: 9  Patient's medical record from [facility]  has been reviewed by the rehabilitation admission coordinator and physician.  Past Medical History  Past Medical History:  Diagnosis Date   Aplastic anemia, unspecified (HCC)    Chest pain, unspecified    Chronic airway obstruction, not elsewhere classified    Cocaine substance abuse ([place])    Neuropathy    Other and unspecified hyperlipidemia    Shortness of breath    Stroke ([place]) 06/17/2020   Tobacco use disorder    Type II or unspecified type diabetes mellitus without mention of complication, not stated as uncontrolled    Unspecified epilepsy without mention of intractable epilepsy    Unspecified essential hypertension     Family History   family history includes COPD in his father; Coronary artery disease in his mother; Heart attack in his father.  Prior Rehab/Hospitalizations Has the patient had prior rehab or hospitalizations prior to admission? Yes  Has the patient had major surgery during 100 days prior to admission? Yes   Current Medications  Current Facility-Administered Medications:    0.9 %  sodium chloride infusion, , Intravenous, Continuous, Simonne Maffucci  B, MD, Stopped at 09/19/20 0355   0.9 %  sodium chloride infusion, , Intravenous, PRN, Simonne Maffucci B, MD, Last Rate: 10 mL/hr at 09/24/20 1800, Infusion Verify at 09/24/20 1800   acetaminophen (TYLENOL) 160 MG/5ML solution 650 mg, 650 mg, Per Tube, Q4H PRN, Simonne Maffucci B, MD, 650 mg at 09/24/20 2024   ALPRAZolam (XANAX) tablet 0.25 mg, 0.25 mg, Oral, TID PRN, Nicole Kindred A, DO, 0.25 mg at 09/26/20 1218   arformoterol (BROVANA) nebulizer solution 15 mcg, 15 mcg, Nebulization, BID, Simonne Maffucci B,  MD, 15 mcg at 09/26/20 6387   aspirin chewable tablet 81 mg, 81 mg, Per Tube, Daily, McQuaid, Nathaneil Canary B, MD, 81 mg at 09/26/20 0806   budesonide (PULMICORT) nebulizer solution 0.5 mg, 0.5 mg, Nebulization, BID, McQuaid, [name] B, MD, 0.5 mg at 09/26/20 5643   carBAMazepine (TEGRETOL) 100 MG/5ML suspension 200 mg, 200 mg, Per Tube, Q6H, McQuaid, [name] B, MD, 200 mg at 09/26/20 1220   chlorhexidine (PERIDEX) 0.12 % solution 15 mL, 15 mL, Mouth Rinse, BID, McQuaid, [name] B, MD, 15 mL at 09/26/20 0950   Chlorhexidine Gluconate Cloth 2 % PADS 6 each, 6 each, Topical, Q2200, Simonne Maffucci B, MD, 6 each at 09/25/20 2212   clopidogrel (PLAVIX) tablet 75 mg, 75 mg, Per Tube, Daily, Arne Cleveland, MD, 75 mg at 09/26/20 0806   enoxaparin (LOVENOX) injection 40 mg, 40 mg, Subcutaneous, Q24H, Jennye Boroughs, MD, 40 mg at 09/25/20 2210   feeding supplement (OSMOLITE 1.5 CAL) liquid 1,000 mL, 1,000 mL, Per Tube, Continuous, Nicole Kindred A, DO, Last Rate: 55 mL/hr at 09/25/20 1608, Rate Change at 09/25/20 1608   feeding supplement (PROSource TF) liquid 45 mL, 45 mL, Per Tube, Daily, McQuaid, [name] B, MD, 45 mL at 09/26/20 0806   fenofibrate tablet 160 mg, 160 mg, Per Tube, Daily, Simonne Maffucci B, MD, 160 mg at 09/26/20 0806   guaiFENesin (ROBITUSSIN) 100 MG/5ML solution 200 mg, 10 mL, Per Tube, Q4H, McQuaid, [name] B, MD, 200 mg at 09/26/20 1217   haloperidol lactate (HALDOL) injection 5 mg, 5 mg, Intramuscular, Q6H PRN, Jennye Boroughs, MD   HYDROcodone-acetaminophen (NORCO/VICODIN) 5-325 MG per tablet 1-2 tablet, 1-2 tablet, Oral, Q4H PRN, Arne Cleveland, MD   HYDROmorphone (DILAUDID) injection 1 mg, 1 mg, Intravenous, Q2H PRN, Arne Cleveland, MD, 1 mg at 09/25/20 2331   insulin aspart (novoLOG) injection 0-20 Units, 0-20 Units, Subcutaneous, Q4H, McQuaid, [name] B, MD, 7 Units at 09/26/20 1231   insulin aspart (novoLOG) injection 3 Units, 3 Units, Subcutaneous, Q4H, McQuaid, [name] B, MD, 3  Units at 09/26/20 1231   insulin detemir (LEVEMIR) injection 30 Units, 30 Units, Subcutaneous, BID, Jennye Boroughs, MD, 30 Units at 09/26/20 0801   ipratropium-albuterol (DUONEB) 0.5-2.5 (3) MG/3ML nebulizer solution 3 mL, 3 mL, Nebulization, Q4H PRN, McQuaid, [name] B, MD, 3 mL at 09/23/20 0232   ipratropium-albuterol (DUONEB) 0.5-2.5 (3) MG/3ML nebulizer solution 3 mL, 3 mL, Nebulization, BID, Nicole Kindred A, DO, 3 mL at 09/26/20 0834   liver oil-zinc oxide (DESITIN) 40 % ointment, , Topical, Daily PRN, Jennye Boroughs, MD, Given at 09/23/20 2015   MEDLINE mouth rinse, 15 mL, Mouth Rinse, q12n4p, McQuaid, [name] B, MD, 15 mL at 09/26/20 1220   melatonin tablet 3 mg, 3 mg, Per Tube, QHS PRN, Simonne Maffucci B, MD, 3 mg at 09/25/20 2216   nitroGLYCERIN (NITROSTAT) SL tablet 0.4 mg, 0.4 mg, Sublingual, Q5 min PRN, Simonne Maffucci B, MD   ondansetron (ZOFRAN) injection 4 mg, 4 mg,  Intravenous, Q4H PRN, Arne Cleveland, MD   polyethylene glycol (MIRALAX / GLYCOLAX) packet 17 g, 17 g, Per Tube, Daily, McQuaid, [name] B, MD, 17 g at 09/25/20 1224   rosuvastatin (CRESTOR) tablet 20 mg, 20 mg, Per Tube, Daily, Simonne Maffucci B, MD, 20 mg at 09/26/20 0806  Patients Current Diet:  Diet Order             Diet NPO time specified Except for: Ice Chips  Diet effective now                   Precautions / Restrictions Precautions Precautions: Fall Precaution Comments: supplemental O2 via Groom, L hemi Restrictions Weight Bearing Restrictions: No   Has the patient had 2 or more falls or a fall with injury in the past year? No  Prior Activity Level Limited Community (1-2x/wk): supervision with RW, supervision to min with adls  Prior Functional Level Self Care: Did the patient need help bathing, dressing, using the toilet or eating? Needed some help  Indoor Mobility: Did the patient need assistance with walking from room to room (with or without device)? Needed some help  Stairs: Did  the patient need assistance with internal or external stairs (with or without device)? Needed some help  Functional Cognition: Did the patient need help planning regular tasks such as shopping or remembering to take medications? Needed some help  Home Assistive Devices / [place] Devices/Equipment: Kasandra Knudsen (specify quad or straight), Bedside commode/3-in-1, Wheelchair Home Equipment: Toilet riser, Environmental consultant - 2 wheels, Bedside commode, Shower seat, Grab bars - toilet, Cane - quad, Wheelchair - manual, Other (comment)  Prior Device Use: Indicate devices/aids used by the patient prior to current illness, exacerbation or injury? Walker  Current Functional Level Cognition  Arousal/Alertness: Awake/alert Overall Cognitive Status: Impaired/Different from baseline Current Attention Level: Sustained (with moderate cues) Orientation Level: Oriented X4 Following Commands: Follows one step commands inconsistently, Follows one step commands with increased time Safety/Judgement: Decreased awareness of safety, Decreased awareness of deficits General Comments: Pt with very poor attention, and is very easily distracted.  He requires moderate cues to sustain attention to familiar grooming tasks.  due to attentional deficits, he requires max cues for sequencing and problem solving Attention: Focused Memory: Appears intact Awareness: Appears intact Executive Function: Reasoning, Organizing    Extremity Assessment (includes Sensation/Coordination)  Upper Extremity Assessment: LUE deficits/detail RUE Deficits / Details: AROM WFL; MMT 3+/5 at shoulder and triceps. 4-/5 at biceps and grossly grasp. RUE Coordination: WNL LUE Deficits / Details: Pt demonstrates tremor Lt UE when he attempts to move.  He requires max cues to attempt activity/reaching with Lt UE as he frequently replies with "I can't", and demonstrates difficulty sustaining attention to attempt use of it.  He does demonstrate active  extension of digits, and minimal active flexion as well as minimal shoulder flexion with max facilliation and encouragement. LUE Sensation: decreased light touch (Endorses numbness/tingling) LUE Coordination: decreased fine motor  Lower Extremity Assessment: Defer to PT evaluation LLE Deficits / Details: grossly -3/5 LLE Sensation: decreased light touch LLE Coordination: decreased fine motor, decreased gross motor    ADLs  Overall ADL's : Needs assistance/impaired Eating/Feeding: Minimal assistance, Sitting Eating/Feeding Details (indicate cue type and reason): Pt required assist to scoop ice chips onto spoon, but able to move spoon to mouth.  Oral care performed prior to administration of ice chips Grooming: Moderate assistance, Sitting Grooming Details (indicate cue type and reason): Pt requires supervision and mod verbal cues to  wash face (for thoroughness), supervision and mod verbal cues to sequence and attend to brushing teeth, and max A to wash bil. hands. Upper Body Bathing: Maximal assistance Lower Body Bathing: Maximal assistance Upper Body Dressing : Moderate assistance, Sitting Lower Body Dressing: Total assistance, Bed level Lower Body Dressing Details (indicate cue type and reason): To don footwear Toilet Transfer: Total assistance Toilet Transfer Details (indicate cue type and reason): Use of Stedy and Mod A +2. Toileting- Clothing Manipulation and Hygiene: Maximal assistance, Total assistance Tub/ Shower Transfer: Maximal assistance Functional mobility during ADLs: Maximal assistance General ADL Comments: Patient greatly limited by L hemiplegia, visual deficits, increased need for supplemental O2 via HHFNC, decreased static sitting balance and need for +2 assist for all bed mobility and functional transfers grossly.    Mobility  Overal bed mobility: Needs Assistance Bed Mobility: Supine to Sit, Rolling Rolling: Mod assist Sidelying to sit: Mod assist Supine to sit: Mod  assist, HOB elevated, +2 for physical assistance Sit to supine: Mod assist, +2 for physical assistance Sit to sidelying: Mod assist, +2 for physical assistance General bed mobility comments: Pt sitting up in chair    Transfers  Overall transfer level: Needs assistance Equipment used: 2 person hand held assist Transfer via Lift Equipment: Stedy Transfers: Sit to/from Stand, [employer] Transfers Sit to Stand: Mod assist, +2 physical assistance, From elevated surface Stand pivot transfers: +2 physical assistance, Max assist, +2 safety/equipment (+3 one time) General transfer comment: Pt up in chair    Ambulation / Gait / Stairs / Wheelchair Mobility  Ambulation/Gait General Gait Details: unable at this time.    Posture / Balance Dynamic Sitting Balance Sitting balance - Comments: leans/pulls to the right his strong side, cues to find midline, and cannot maintain sitting without external support and certainly not without his own R hand held assist. Balance Overall balance assessment: Needs assistance Sitting-balance support: Feet supported, No upper extremity supported Sitting balance-Leahy Scale: Poor Sitting balance - Comments: leans/pulls to the right his strong side, cues to find midline, and cannot maintain sitting without external support and certainly not without his own R hand held assist. Postural control: Left lateral lean Standing balance support: Bilateral upper extremity supported, During functional activity Standing balance-Leahy Scale: Poor Standing balance comment: +2 heavy mod assist in standing.    Special needs/care consideration New PEG placed by IR on 8/9 Very impulsive and lacks safety awareness   Previous Home Environment  Living Arrangements:  (girlfriend of 30 years)  Lives With: Significant other Available Help at Discharge: Family, Available 24 hours/day Type of Home: House Home Layout: One level Home Access: Level entry Bathroom Shower/Tub: Scientist, forensic: Handicapped height Bathroom Accessibility: Yes [place]: Yes Type of Home Care Services: Homehealth aide  Discharge Living Setting Plans for Discharge Living Setting: Patient's home, Lives with (comment) (girlfriend of 30 years) Discharge Home Layout: One level Discharge Home Access: Level entry Discharge Bathroom Shower/Tub: Tub/shower unit Discharge Bathroom Toilet: Handicapped height Discharge Bathroom Accessibility: Yes How Accessible: Accessible via walker Does the patient have any problems obtaining your medications?: No  Social/Family/Support Systems Patient Roles: Partner Contact Information: Hanley Hays and daughter, Jonelle Sidle, 93 years  old Anticipated Caregiver: [name] Anticipated Ambulance person Information: see above Ability/Limitations of Caregiver: [name] is a CNA by Pensions consultant Availability: 24/7 Discharge Plan Discussed with Primary Caregiver: Yes Is Caregiver In Agreement with Plan?: Yes Does Caregiver/Family have Issues with Lodging/Transportation while Pt is in Rehab?: No  Goals Patient/Family Goal  for Rehab: min assist with PT, OT and SLP Expected length of stay: ELOS 2 to 3 weeks Pt/Family Agrees to Admission and willing to participate: Yes Program Orientation Provided & Reviewed with Pt/Caregiver Including Roles  & Responsibilities: Yes Information Needs to be Provided By: Health Care directives  Decrease burden of Care through IP rehab admission: na  Possible need for SNF placement upon discharge: not anticipated  Patient Condition: I have reviewed medical records from [facility] , spoken with CM, and patient, spouse, and daughter. I met with patient at the bedside for inpatient rehabilitation assessment.  Patient will benefit from ongoing PT, OT, and SLP, can actively participate in 3 hours of therapy a day 5 days of the week, and can make measurable gains during the admission.  Patient will also benefit from  the coordinated team approach during an Inpatient Acute Rehabilitation admission.  The patient will receive intensive therapy as well as Rehabilitation physician, nursing, social worker, and care management interventions.  Due to bladder management, bowel management, safety, skin/wound care, disease management, medication administration, pain management, and patient education the patient requires 24 hour a day rehabilitation nursing.  The patient is currently mod assist overall with mobility and basic ADLs.  Discharge setting and therapy post discharge at home with home health is anticipated.  Patient has agreed to participate in the Acute Inpatient Rehabilitation Program and will admit today.  Preadmission Screen Completed By:  Cleatrice Burke, 09/26/2020 1:33 PM ______________________________________________________________________   Discussed status with Dr. Ranell Patrick on 09/26/2020 at 1334 and received approval for admission today.  Admission Coordinator:  Cleatrice Burke, RN, time 1470 Date  09/26/2020   Assessment/Plan: Diagnosis: R ACA CVA Does the need for close, 24 hr/day Medical supervision in concert with the patient's rehab needs make it unreasonable for this patient to be served in a less intensive setting? Yes Co-Morbidities requiring supervision/potential complications: hypertriglyceridemia, tobacco abuse, COPD with acute exacerbation, cocaine abuse, CVA Due to bladder management, bowel management, safety, skin/wound care, disease management, medication administration, pain management, and patient education, does the patient require 24 hr/day rehab nursing? Yes Does the patient require coordinated care of a physician, rehab nurse, PT, OT, and SLP to address physical and functional deficits in the context of the above medical diagnosis(es)? Yes Addressing deficits in the following areas: balance, endurance, locomotion, strength, transferring, bowel/bladder control, bathing,  dressing, feeding, grooming, toileting, cognition, and psychosocial support Can the patient actively participate in an intensive therapy program of at least 3 hrs of therapy 5 days a week? Yes The potential for patient to make measurable gains while on inpatient rehab is good Anticipated functional outcomes upon discharge from inpatient rehab: min assist PT, min assist OT, min assist SLP Estimated rehab length of stay to reach the above functional goals is: 2 weeks Anticipated discharge destination: Home 10. Overall Rehab/Functional Prognosis: excellent   MD Signature: Leeroy Cha, MD  Addendum 09/27/20: PEG was accidentally was pulled on by patient leading to some bleeding around and in PEG. Labs are stable this morning. Pt tolerating TF's. Will admit to inpatient rehab today.   Meredith Staggers, MD, [place] [facility] 09/27/2020

## 2020-09-26 NOTE — Progress Notes (Addendum)
Occupational Therapy Treatment Patient Details Name: Brent [name] MRN: [id number] DOB: 1965-07-03 Today's Date: 09/26/2020    History of present illness 55 y.o. male presenting to ED with SOB, slurred speech and L sided weakness. Patient admitted with acute respiratory failure with hypoxia secondary to aspiration PNA, COPD exacerbation and sepsis. Imaging (+) R ACA CVA, R parietal lobe subcortical white matter infarct and ? acute punctate infarct within L frontal lobe. Transferred to ICU at [facility] 7/31 due to ongoing respiratory failure with complete opacification of left hemithorax.   PMHx significant for cocaine and tobacco use disorder, lumbago, HTN, DMII and CVA.   OT comments  Pt requires mod cues and up to mod A for simple grooming activities while sitting up in recliner.  He is very highly distracted with poor attention.  Worked on facilitation of reach with Lt UE and max cues. He requires mod - max A for ADLs.    Follow Up Recommendations  CIR    Equipment Recommendations  None recommended by OT    Recommendations for Other Services Rehab consult    Precautions / Restrictions Precautions Precautions: Fall Precaution Comments: supplemental O2 via [place], L hemi Restrictions Weight Bearing Restrictions: No       Mobility Bed Mobility   General bed mobility comments: Pt sitting up in chair    Transfers        General transfer comment: Pt up in chair    Balance Overall balance assessment: Needs assistance Sitting-balance support: Feet supported;No upper extremity supported Sitting balance-Leahy Scale: Poor                         ADL either performed or assessed with clinical judgement   ADL Overall ADL's : Needs assistance/impaired Eating/Feeding: Minimal assistance;Sitting Eating/Feeding Details (indicate cue type and reason): Pt required assist to scoop ice chips onto spoon, but able to move spoon to mouth.  Oral care performed prior to administration  of ice chips Grooming: Moderate assistance;Sitting Grooming Details (indicate cue type and reason): Pt requires supervision and mod verbal cues to wash face (for thoroughness), supervision and mod verbal cues to sequence and attend to brushing teeth, and max A to wash bil. hands. Upper Body Bathing: Maximal assistance                                   Vision   Additional Comments: Pt with Rt gaze preference   Perception     Praxis      Cognition Arousal/Alertness: Awake/alert Behavior During Therapy: Impulsive;Restless Overall Cognitive Status: Impaired/Different from baseline Area of Impairment: Attention;Following commands;Memory;Safety/judgement;Awareness;Problem solving                   Current Attention Level: Sustained (with moderate cues) Memory: Decreased short-term memory;Decreased recall of precautions Following Commands: Follows one step commands inconsistently;Follows one step commands with increased time Safety/Judgement: Decreased awareness of safety;Decreased awareness of deficits Awareness: Intellectual Problem Solving: Slow processing;Decreased initiation;Difficulty sequencing;Requires tactile cues General Comments: Pt with very poor attention, and is very easily distracted.  He requires moderate cues to sustain attention to familiar grooming tasks.  due to attentional deficits, he requires max cues for sequencing and problem solving        Exercises Exercises: Other exercises Other Exercises Other Exercises: worked on facillitation of reach with LT UE.  He requires max cues and max facillitation to reach for cup  at ~50* shoulder flexion   Shoulder Instructions       General Comments O2 via [place] throughout, HR 100s-120s observed    Pertinent Vitals/ Pain       Pain Assessment: Faces Faces Pain Scale: No hurt Pain Location: R leg Pain Descriptors / Indicators: Discomfort Pain Intervention(s): Limited activity within patient's  tolerance;Monitored during session;Repositioned  Home Living                                          Prior Functioning/Environment              Frequency  Min 2X/week        Progress Toward Goals  OT Goals(current goals can now be found in the care plan section)  Progress towards OT goals: Progressing toward goals  Acute Rehab OT Goals Patient Stated Goal: to have a w/c OT Goal Formulation: With patient Time For Goal Achievement: 10/10/20 Potential to Achieve Goals: Good  Plan Discharge plan remains appropriate;Frequency remains appropriate    Co-evaluation                 AM-PAC OT "6 Clicks" Daily Activity     Outcome Measure   Help from another person eating meals?: A Little Help from another person taking care of personal grooming?: A Lot Help from another person toileting, which includes using toliet, bedpan, or urinal?: A Lot Help from another person bathing (including washing, rinsing, drying)?: A Lot Help from another person to put on and taking off regular upper body clothing?: A Lot Help from another person to put on and taking off regular lower body clothing?: Total 6 Click Score: 12    End of Session Equipment Utilized During Treatment: Oxygen  OT Visit Diagnosis: Unsteadiness on feet (R26.81);Muscle weakness (generalized) (M62.81);Other symptoms and signs involving the nervous system (R29.898);Hemiplegia and hemiparesis Hemiplegia - Right/Left: Left Hemiplegia - dominant/non-dominant: Non-Dominant Hemiplegia - caused by: Cerebral infarction   Activity Tolerance Patient tolerated treatment well   Patient Left in bed;with call bell/phone within reach;with chair alarm set   Nurse Communication Mobility status        Time: 8144-8185 OT Time Calculation (min): 26 min  Charges: OT General Charges $OT Visit: 1 Visit OT Treatments $Self Care/Home Management : 8-22 mins $Neuromuscular Re-education: 8-22 mins  Eber Jones., OTR/L Acute Rehabilitation Services Pager [phone] Office [phone]    Jeani Hawking M 09/26/2020, 10:55 AM

## 2020-09-26 NOTE — Progress Notes (Signed)
PMR Admission Coordinator Pre-Admission Assessment   Patient: Brent [name] is an 55 y.o., male MRN: [id number] DOB: November 10, 1965 Height: 6' (182.9 cm)Weight: 83 kg   Insurance Information HMO:     PPO:      PCP:      IPA:      80/20:      OTHER:  PRIMARY: [employer] [place] medicaid      Policy#: [id number]      Subscriber: pt CM Name: Marijo Sanes      Phone#: [phone]     Fax#: [phone] Pre-Cert#: [id number] approved until 10/03/20      Employer:  Benefits:  Phone #: [phone]     Name: 8/11 Eff. Date: 08/2019     Deduct: none      Out of Pocket Max: none CIR: per medicaid guidelines         Financial Counselor:       Phone#:    The "Data Collection Information Summary" for patients in Inpatient Rehabilitation Facilities with attached "Privacy Act [place] Records" was provided and verbally reviewed with: N/A   Emergency Contact Information Contact Information       Name Relation Home Work Mobile    [place] Significant other [place]    [facility] Mother [phone]      Rosner,Tiffany Daughter     [phone]           Current Medical History  Patient Admitting Diagnosis:CVA   History of Present Illness: 55 year old male with medical history of HTN, HLD, type 2 DM, cocaine abuse, tobacco abuse  and recent CVA with a 2 to 3 day history of slurred speech, left hemiparesis and SOB. Presented to [facility] on 09/11/2020 with cough and worsening of SOB per significant other. Found to have sepsis secondary to aspiration PNA, COPD exacerbation, acute hypoxic respiratory failure and acute CVA. Transferred to [facility] for further respiratory management.    He was treated with empiric IV antibiotics, steroids and bronchodilators. He required humidified high flow oxygenation, and NG tube placement for enteral nutrition because of severe dysphagia. He has completed 14 days of antibiotics and is NPO with tube feeds.  Now on 2 liters [place]. Continue aspirin for acute CVA and resuming Plavix. Status post PEG placement by OR on 8/9. Tolerating tube feeds now. Plan outpatient pulmonary follow up for PFTS. Continue Levemir and SS Novolog. Lovenox for DVT prophylaxis.   Complete NIHSS TOTAL: 9   Patient's medical record from [facility]  has been reviewed by the rehabilitation admission coordinator and physician.   Past Medical History      Past Medical History:  Diagnosis Date   Aplastic anemia, unspecified (HCC)     Chest pain, unspecified     Chronic airway obstruction, not elsewhere classified     Cocaine substance abuse ([place])     Neuropathy     Other and unspecified hyperlipidemia     Shortness of breath     Stroke ([place]) 06/17/2020   Tobacco use disorder     Type II or unspecified type diabetes mellitus without mention of complication, not stated as uncontrolled     Unspecified epilepsy without mention of intractable epilepsy     Unspecified essential hypertension        Family History   family history includes COPD in his father; Coronary artery disease in his mother; Heart attack in his father.   Prior Rehab/Hospitalizations Has the patient had prior rehab or  hospitalizations prior to admission? Yes   Has the patient had major surgery during 100 days prior to admission? Yes              Current Medications   Current Facility-Administered Medications:    0.9 %  sodium chloride infusion, , Intravenous, Continuous, McQuaid, [name] B, MD, Stopped at 09/19/20 0355   0.9 %  sodium chloride infusion, , Intravenous, PRN, Simonne Maffucci B, MD, Last Rate: 10 mL/hr at 09/24/20 1800, Infusion Verify at 09/24/20 1800   acetaminophen (TYLENOL) 160 MG/5ML solution 650 mg, 650 mg, Per Tube, Q4H PRN, Simonne Maffucci B, MD, 650 mg at 09/24/20 2024   ALPRAZolam (XANAX) tablet 0.25 mg, 0.25 mg, Oral, TID PRN, Nicole Kindred A, DO, 0.25 mg at 09/26/20 1218   arformoterol (BROVANA) nebulizer solution  15 mcg, 15 mcg, Nebulization, BID, Simonne Maffucci B, MD, 15 mcg at 09/26/20 2229   aspirin chewable tablet 81 mg, 81 mg, Per Tube, Daily, McQuaid, Nathaneil Canary B, MD, 81 mg at 09/26/20 0806   budesonide (PULMICORT) nebulizer solution 0.5 mg, 0.5 mg, Nebulization, BID, McQuaid, [name] B, MD, 0.5 mg at 09/26/20 7989   carBAMazepine (TEGRETOL) 100 MG/5ML suspension 200 mg, 200 mg, Per Tube, Q6H, McQuaid, [name] B, MD, 200 mg at 09/26/20 1220   chlorhexidine (PERIDEX) 0.12 % solution 15 mL, 15 mL, Mouth Rinse, BID, McQuaid, [name] B, MD, 15 mL at 09/26/20 0950   Chlorhexidine Gluconate Cloth 2 % PADS 6 each, 6 each, Topical, Q2200, Simonne Maffucci B, MD, 6 each at 09/25/20 2212   clopidogrel (PLAVIX) tablet 75 mg, 75 mg, Per Tube, Daily, Arne Cleveland, MD, 75 mg at 09/26/20 0806   enoxaparin (LOVENOX) injection 40 mg, 40 mg, Subcutaneous, Q24H, Jennye Boroughs, MD, 40 mg at 09/25/20 2210   feeding supplement (OSMOLITE 1.5 CAL) liquid 1,000 mL, 1,000 mL, Per Tube, Continuous, Nicole Kindred A, DO, Last Rate: 55 mL/hr at 09/25/20 1608, Rate Change at 09/25/20 1608   feeding supplement (PROSource TF) liquid 45 mL, 45 mL, Per Tube, Daily, McQuaid, [name] B, MD, 45 mL at 09/26/20 0806   fenofibrate tablet 160 mg, 160 mg, Per Tube, Daily, Simonne Maffucci B, MD, 160 mg at 09/26/20 0806   guaiFENesin (ROBITUSSIN) 100 MG/5ML solution 200 mg, 10 mL, Per Tube, Q4H, McQuaid, [name] B, MD, 200 mg at 09/26/20 1217   haloperidol lactate (HALDOL) injection 5 mg, 5 mg, Intramuscular, Q6H PRN, Jennye Boroughs, MD   HYDROcodone-acetaminophen (NORCO/VICODIN) 5-325 MG per tablet 1-2 tablet, 1-2 tablet, Oral, Q4H PRN, Arne Cleveland, MD   HYDROmorphone (DILAUDID) injection 1 mg, 1 mg, Intravenous, Q2H PRN, Arne Cleveland, MD, 1 mg at 09/25/20 2331   insulin aspart (novoLOG) injection 0-20 Units, 0-20 Units, Subcutaneous, Q4H, McQuaid, [name] B, MD, 7 Units at 09/26/20 1231   insulin aspart (novoLOG) injection 3 Units,  3 Units, Subcutaneous, Q4H, McQuaid, [name] B, MD, 3 Units at 09/26/20 1231   insulin detemir (LEVEMIR) injection 30 Units, 30 Units, Subcutaneous, BID, Jennye Boroughs, MD, 30 Units at 09/26/20 0801   ipratropium-albuterol (DUONEB) 0.5-2.5 (3) MG/3ML nebulizer solution 3 mL, 3 mL, Nebulization, Q4H PRN, McQuaid, [name] B, MD, 3 mL at 09/23/20 0232   ipratropium-albuterol (DUONEB) 0.5-2.5 (3) MG/3ML nebulizer solution 3 mL, 3 mL, Nebulization, BID, Nicole Kindred A, DO, 3 mL at 09/26/20 0834   liver oil-zinc oxide (DESITIN) 40 % ointment, , Topical, Daily PRN, Jennye Boroughs, MD, Given at 09/23/20 2015   MEDLINE mouth rinse, 15 mL, Mouth Rinse, q12n4p, McQuaid, [name] B,  MD, 15 mL at 09/26/20 1220   melatonin tablet 3 mg, 3 mg, Per Tube, QHS PRN, Simonne Maffucci B, MD, 3 mg at 09/25/20 2216   nitroGLYCERIN (NITROSTAT) SL tablet 0.4 mg, 0.4 mg, Sublingual, Q5 min PRN, Simonne Maffucci B, MD   ondansetron (ZOFRAN) injection 4 mg, 4 mg, Intravenous, Q4H PRN, Arne Cleveland, MD   polyethylene glycol (MIRALAX / GLYCOLAX) packet 17 g, 17 g, Per Tube, Daily, McQuaid, [name] B, MD, 17 g at 09/25/20 1224   rosuvastatin (CRESTOR) tablet 20 mg, 20 mg, Per Tube, Daily, Simonne Maffucci B, MD, 20 mg at 09/26/20 0806   Patients Current Diet:  Diet Order                  Diet NPO time specified Except for: Ice Chips  Diet effective now                         Precautions / Restrictions Precautions Precautions: Fall Precaution Comments: supplemental O2 via [place], L hemi Restrictions Weight Bearing Restrictions: No    Has the patient had 2 or more falls or a fall with injury in the past year? No   Prior Activity Level Limited Community (1-2x/wk): supervision with RW, supervision to min with adls   Prior Functional Level Self Care: Did the patient need help bathing, dressing, using the toilet or eating? Needed some help   Indoor Mobility: Did the patient need assistance with walking from  room to room (with or without device)? Needed some help   Stairs: Did the patient need assistance with internal or external stairs (with or without device)? Needed some help   Functional Cognition: Did the patient need help planning regular tasks such as shopping or remembering to take medications? Needed some help   Home Assistive Devices / [place] Devices/Equipment: Kasandra Knudsen (specify quad or straight), Bedside commode/3-in-1, Wheelchair Home Equipment: Toilet riser, Environmental consultant - 2 wheels, Bedside commode, Shower seat, Grab bars - toilet, Cane - quad, Wheelchair - manual, Other (comment)   Prior Device Use: Indicate devices/aids used by the patient prior to current illness, exacerbation or injury? Walker   Current Functional Level Cognition   Arousal/Alertness: Awake/alert Overall Cognitive Status: Impaired/Different from baseline Current Attention Level: Sustained (with moderate cues) Orientation Level: Oriented X4 Following Commands: Follows one step commands inconsistently, Follows one step commands with increased time Safety/Judgement: Decreased awareness of safety, Decreased awareness of deficits General Comments: Pt with very poor attention, and is very easily distracted.  He requires moderate cues to sustain attention to familiar grooming tasks.  due to attentional deficits, he requires max cues for sequencing and problem solving Attention: Focused Memory: Appears intact Awareness: Appears intact Executive Function: Reasoning, Organizing    Extremity Assessment (includes Sensation/Coordination)   Upper Extremity Assessment: LUE deficits/detail RUE Deficits / Details: AROM WFL; MMT 3+/5 at shoulder and triceps. 4-/5 at biceps and grossly grasp. RUE Coordination: WNL LUE Deficits / Details: Pt demonstrates tremor Lt UE when he attempts to move.  He requires max cues to attempt activity/reaching with Lt UE as he frequently replies with "I can't", and demonstrates  difficulty sustaining attention to attempt use of it.  He does demonstrate active extension of digits, and minimal active flexion as well as minimal shoulder flexion with max facilliation and encouragement. LUE Sensation: decreased light touch (Endorses numbness/tingling) LUE Coordination: decreased fine motor  Lower Extremity Assessment: Defer to PT evaluation LLE Deficits / Details: grossly -3/5 LLE Sensation:  decreased light touch LLE Coordination: decreased fine motor, decreased gross motor     ADLs   Overall ADL's : Needs assistance/impaired Eating/Feeding: Minimal assistance, Sitting Eating/Feeding Details (indicate cue type and reason): Pt required assist to scoop ice chips onto spoon, but able to move spoon to mouth.  Oral care performed prior to administration of ice chips Grooming: Moderate assistance, Sitting Grooming Details (indicate cue type and reason): Pt requires supervision and mod verbal cues to wash face (for thoroughness), supervision and mod verbal cues to sequence and attend to brushing teeth, and max A to wash bil. hands. Upper Body Bathing: Maximal assistance Lower Body Bathing: Maximal assistance Upper Body Dressing : Moderate assistance, Sitting Lower Body Dressing: Total assistance, Bed level Lower Body Dressing Details (indicate cue type and reason): To don footwear Toilet Transfer: Total assistance Toilet Transfer Details (indicate cue type and reason): Use of Stedy and Mod A +2. Toileting- Clothing Manipulation and Hygiene: Maximal assistance, Total assistance Tub/ Shower Transfer: Maximal assistance Functional mobility during ADLs: Maximal assistance General ADL Comments: Patient greatly limited by L hemiplegia, visual deficits, increased need for supplemental O2 via [facility], decreased static sitting balance and need for +2 assist for all bed mobility and functional transfers grossly.     Mobility   Overal bed mobility: Needs Assistance Bed Mobility: Supine  to Sit, Rolling Rolling: Mod assist Sidelying to sit: Mod assist Supine to sit: Mod assist, HOB elevated, +2 for physical assistance Sit to supine: Mod assist, +2 for physical assistance Sit to sidelying: Mod assist, +2 for physical assistance General bed mobility comments: Pt sitting up in chair     Transfers   Overall transfer level: Needs assistance Equipment used: 2 person hand held assist Transfer via Lift Equipment: Stedy Transfers: Sit to/from Stand, [employer] Transfers Sit to Stand: Mod assist, +2 physical assistance, From elevated surface Stand pivot transfers: +2 physical assistance, Max assist, +2 safety/equipment (+3 one time) General transfer comment: Pt up in chair     Ambulation / Gait / Stairs / Wheelchair Mobility   Ambulation/Gait General Gait Details: unable at this time.     Posture / Balance Dynamic Sitting Balance Sitting balance - Comments: leans/pulls to the right his strong side, cues to find midline, and cannot maintain sitting without external support and certainly not without his own R hand held assist. Balance Overall balance assessment: Needs assistance Sitting-balance support: Feet supported, No upper extremity supported Sitting balance-Leahy Scale: Poor Sitting balance - Comments: leans/pulls to the right his strong side, cues to find midline, and cannot maintain sitting without external support and certainly not without his own R hand held assist. Postural control: Left lateral lean Standing balance support: Bilateral upper extremity supported, During functional activity Standing balance-Leahy Scale: Poor Standing balance comment: +2 heavy mod assist in standing.     Special needs/care consideration New PEG placed by IR on 8/9 Very impulsive and lacks safety awareness    Previous Home Environment  Living Arrangements:  (girlfriend of 30 years)  Lives With: Significant other Available Help at Discharge: Family, Available 24 hours/day Type of  Home: House Home Layout: One level Home Access: Level entry Bathroom Shower/Tub: Chiropodist: Handicapped height Bathroom Accessibility: Yes [place]: Yes Type of Home Care Services: Homehealth aide   Discharge Living Setting Plans for Discharge Living Setting: Patient's home, Lives with (comment) (girlfriend of 30 years) Discharge Home Layout: One level Discharge Home Access: Level entry Discharge Bathroom Shower/Tub: Espy unit Discharge Bathroom Toilet:  Handicapped height Discharge Bathroom Accessibility: Yes How Accessible: Accessible via walker Does the patient have any problems obtaining your medications?: No   Social/Family/Support Systems Patient Roles: Partner Contact Information: Hanley Hays and daughter, Jonelle Sidle, 6 years  old Anticipated Caregiver: [name] Anticipated Ambulance person Information: see above Ability/Limitations of Caregiver: [name] is a Quarry manager by Pensions consultant Availability: 24/7 Discharge Plan Discussed with Primary Caregiver: Yes Is Caregiver In Agreement with Plan?: Yes Does Caregiver/Family have Issues with Lodging/Transportation while Pt is in Rehab?: No   Goals Patient/Family Goal for Rehab: min assist with PT, OT and SLP Expected length of stay: ELOS 2 to 3 weeks Pt/Family Agrees to Admission and willing to participate: Yes Program Orientation Provided & Reviewed with Pt/Caregiver Including Roles  & Responsibilities: Yes Information Needs to be Provided By: Health Care directives   Decrease burden of Care through IP rehab admission: na   Possible need for SNF placement upon discharge: not anticipated   Patient Condition: I have reviewed medical records from [facility] , spoken with CM, and patient, spouse, and daughter. I met with patient at the bedside for inpatient rehabilitation assessment.  Patient will benefit from ongoing PT, OT, and SLP, can actively participate in 3 hours of therapy a day 5  days of the week, and can make measurable gains during the admission.  Patient will also benefit from the coordinated team approach during an Inpatient Acute Rehabilitation admission.  The patient will receive intensive therapy as well as Rehabilitation physician, nursing, social worker, and care management interventions.  Due to bladder management, bowel management, safety, skin/wound care, disease management, medication administration, pain management, and patient education the patient requires 24 hour a day rehabilitation nursing.  The patient is currently mod assist overall with mobility and basic ADLs.  Discharge setting and therapy post discharge at home with home health is anticipated.  Patient has agreed to participate in the Acute Inpatient Rehabilitation Program and will admit today.   Preadmission Screen Completed By:  Cleatrice Burke, 09/26/2020 1:33 PM ______________________________________________________________________   Discussed status with Dr. Ranell Patrick on 09/26/2020 at 1334 and received approval for admission today.   Admission Coordinator:  Cleatrice Burke, RN, time [id number] Date  09/26/2020    Assessment/Plan: Diagnosis: R ACA CVA Does the need for close, 24 hr/day Medical supervision in concert with the patient's rehab needs make it unreasonable for this patient to be served in a less intensive setting? Yes Co-Morbidities requiring supervision/potential complications: hypertriglyceridemia, tobacco abuse, COPD with acute exacerbation, cocaine abuse, CVA Due to bladder management, bowel management, safety, skin/wound care, disease management, medication administration, pain management, and patient education, does the patient require 24 hr/day rehab nursing? Yes Does the patient require coordinated care of a physician, rehab nurse, PT, OT, and SLP to address physical and functional deficits in the context of the above medical diagnosis(es)? Yes Addressing deficits in the  following areas: balance, endurance, locomotion, strength, transferring, bowel/bladder control, bathing, dressing, feeding, grooming, toileting, cognition, and psychosocial support Can the patient actively participate in an intensive therapy program of at least 3 hrs of therapy 5 days a week? Yes The potential for patient to make measurable gains while on inpatient rehab is good Anticipated functional outcomes upon discharge from inpatient rehab: min assist PT, min assist OT, min assist SLP Estimated rehab length of stay to reach the above functional goals is: 2 weeks Anticipated discharge destination: Home 10. Overall Rehab/Functional Prognosis: excellent     MD Signature: Leeroy Cha, MD  Revision History                               Note Details  Author Ranell Patrick, Clide Deutscher, MD File Time 09/26/2020  1:57 PM  Author Type Physician Status Signed  Last Editor Izora Ribas, MD Service Physical Medicine and Rehabilitation

## 2020-09-26 NOTE — Progress Notes (Signed)
Physical Therapy Treatment Patient Details Name: Brent [name] MRN: [id number] DOB: [birth year] Today's Date: 09/26/2020    History of Present Illness 55 y.o. male presenting to ED with SOB, slurred speech and L sided weakness. Patient admitted with acute respiratory failure with hypoxia secondary to aspiration PNA, COPD exacerbation and sepsis. Imaging (+) R ACA CVA, R parietal lobe subcortical white matter infarct and ? acute punctate infarct within L frontal lobe. Transferred to ICU at [facility] 7/31 due to ongoing respiratory failure with complete opacification of left hemithorax.   PMHx significant for cocaine and tobacco use disorder, lumbago, HTN, DMII and CVA.    PT Comments    Pt continues to be restless and impulsive with little to no safety awareness, needing repeated cues to find midline and maintain balance in upright positions. Pt with activating his L UE and lower extremity some to assist with bed mobility and transfers, but still needing mod-maxAx2 for all due to the extent of his L-sided weakness and coordination deficits. Will continue to follow acutely. Current recommendations remain appropriate.   Follow Up Recommendations  CIR     Equipment Recommendations  None recommended by PT    Recommendations for Other Services       Precautions / Restrictions Precautions Precautions: Fall Precaution Comments: supplemental O2 via [place], L hemi Restrictions Weight Bearing Restrictions: No    Mobility  Bed Mobility Overal bed mobility: Needs Assistance Bed Mobility: Supine to Sit;Rolling Rolling: Mod assist   Supine to sit: Mod assist;HOB elevated;+2 for physical assistance     General bed mobility comments: Rolling each direction with assistance to flex knees and cues for UE reach, modA. ModAx2 to manage legs and trunk to sit up L EOB, min-no initiation of L leg to slide off EOB.    Transfers Overall transfer level: Needs assistance Equipment used: 2 person hand held  assist Transfers: Sit to/from [employer] Sit to Stand: Mod assist;+2 physical assistance;From elevated surface Stand pivot transfers: +2 physical assistance;Max assist;+2 safety/equipment (+3 one time)       General transfer comment: ModAx2 with L knee block and bil UE support to power up to stand and steady. MaxAx2-3 to stand pivot to R x2 bed > commode > recliner, +3 one time to clear commode arm rail.  Ambulation/Gait             General Gait Details: unable at this time.   Stairs             Wheelchair Mobility    Modified Rankin (Stroke Patients Only) Modified Rankin (Stroke Patients Only) Pre-Morbid Rankin Score: Moderate disability Modified Rankin: Severe disability     Balance Overall balance assessment: Needs assistance Sitting-balance support: Feet supported;No upper extremity supported Sitting balance-Leahy Scale: Poor Sitting balance - Comments: leans/pulls to the right his strong side, cues to find midline, and cannot maintain sitting without external support and certainly not without his own R hand held assist. Postural control: Left lateral lean Standing balance support: Bilateral upper extremity supported;During functional activity Standing balance-Leahy Scale: Poor Standing balance comment: +2 heavy mod assist in standing.                            Cognition Arousal/Alertness: Awake/alert Behavior During Therapy: Impulsive;Restless Overall Cognitive Status: Impaired/Different from baseline Area of Impairment: Attention;Memory;Following commands;Safety/judgement;Awareness;Problem solving                   Current Attention Level:  Sustained Memory: Decreased short-term memory;Decreased recall of precautions Following Commands: Follows one step commands inconsistently;Follows one step commands with increased time Safety/Judgement: Decreased awareness of safety;Decreased awareness of deficits Awareness:  Emergent Problem Solving: Difficulty sequencing;Requires verbal cues;Requires tactile cues;Slow processing;Decreased initiation General Comments: Pt with little to no awareness of his safety, repeatedly grabbing to try to move one direction or other even when cued to wait until therapists were prepared. Pt restless and impulsive, flexing anteriorly excessively at hips when sitting often, needing physical assistance to regain balance. Perseverating on wanting water/ice. Slow processing and needing repeated cues to sequence and perform tasks.      Exercises      General Comments General comments (skin integrity, edema, etc.): O2 via [place] throughout, HR 100s-120s observed      Pertinent Vitals/Pain Pain Assessment: Faces Faces Pain Scale: Hurts little more Pain Location: R leg Pain Descriptors / Indicators: Discomfort Pain Intervention(s): Limited activity within patient's tolerance;Monitored during session;Repositioned    Home Living                      Prior Function            PT Goals (current goals can now be found in the care plan section) Acute Rehab PT Goals Patient Stated Goal: to get water/ice PT Goal Formulation: With patient Time For Goal Achievement: 10/03/20 Potential to Achieve Goals: Good Progress towards PT goals: Progressing toward goals    Frequency    Min 4X/week      PT Plan Current plan remains appropriate    Co-evaluation              AM-PAC PT "6 Clicks" Mobility   Outcome Measure  Help needed turning from your back to your side while in a flat bed without using bedrails?: A Lot Help needed moving from lying on your back to sitting on the side of a flat bed without using bedrails?: Total Help needed moving to and from a bed to a chair (including a wheelchair)?: Total Help needed standing up from a chair using your arms (e.g., wheelchair or bedside chair)?: Total Help needed to walk in hospital room?: Total Help needed climbing  3-5 steps with a railing? : Total 6 Click Score: 7    End of Session Equipment Utilized During Treatment: Gait belt;Oxygen Activity Tolerance: Patient tolerated treatment well Patient left: in chair;with call bell/phone within reach;with chair alarm set;with restraints reapplied (posey belt alarm) Nurse Communication: Mobility status PT Visit Diagnosis: Unsteadiness on feet (R26.81);Other abnormalities of gait and mobility (R26.89);Muscle weakness (generalized) (M62.81);Difficulty in walking, not elsewhere classified (R26.2);Other symptoms and signs involving the nervous system (R29.898)     Time: 9735-3299 PT Time Calculation (min) (ACUTE ONLY): 32 min  Charges:  $Therapeutic Activity: 23-37 mins                     Raymond Gurney, PT, DPT Acute Rehabilitation Services  Pager: [phone] Office: [phone]    Jewel Baize 09/26/2020, 10:17 AM

## 2020-09-26 NOTE — Discharge Summary (Signed)
Physician Discharge Summary  Brent [name] ZOX:[id number] DOB: Aug 13, 1965 DOA: 09/11/2020  PCP: Beatrix Fetters, MD  Admit date: 09/11/2020 Discharge date: 09/26/2020  Admitted From: home Disposition:  CIR  Recommendations for Outpatient Follow-up:  Follow up with PCP in 1-2 weeks Please obtain BMP/CBC in one week Please follow up    Home Health: No  Equipment/Devices: CIR   Discharge Condition: Stable  CODE STATUS: Full  Diet recommendation: NPO, Tube Feeds    Discharge Diagnoses: Active Problems:   Hypertriglyceridemia   Tobacco abuse   COPD with acute exacerbation (HCC)   Cocaine abuse (HCC)   CVA (cerebral vascular accident) (HCC)   Acute respiratory failure with hypoxia (HCC)   Aspiration pneumonitis (HCC)   Dysarthria   Sepsis due to undetermined organism (HCC)   Goals of care, counseling/discussion    Summary of HPI and Hospital Course:  Brent [name] is a 55 y.o. male with past medical history significant for hypertension, type 2 diabetes mellitus, hyperlipidemia, cocaine abuse, tobacco use disorder, seizure disorder, OSA, GERD, stroke, who presented to [facility] with shortness of breath, cough, slurred speech, left-sided weakness and facial droop on 09/11/2020.   He was found to have sepsis secondary to aspiration pneumonia, COPD exacerbation, acute hypoxic respiratory failure and acute stroke.  He was transferred to [facility] for further management.   He was treated with empiric IV antibiotics, steroids and bronchodilators.  He required heated humidified high flow oxygen for oxygenation.  NG tube was placed for enteral nutrition because of dysphagia.   PEG tube was placed by IR on 8/9 for severe dysphagia. Tube feeds initiated and patient tolerating well.    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  Continue PT/OT/SLP at acute inpatient rehab.  CIR able to take pt today and he is medically stable  for d/c.  Dysphagia -SLP following.  Patient is strict NPO.  Status post PEG tube placement by IR on 8/9.  Dietitian following for tube feeds which have been started.  Patient appears tolerating tube feeds well.  Continue to evaluate for swallow improvement.   Nutrition problem: Inadequate oral intake Etiology: Dysphagia due to acute ischemic/embolic stroke N.p.o. status per SLP Intervention: Tube feeds via PEG tube per dietitian Body mass index is 24.7 kg/m.   Sepsis secondary to aspiration pneumonia, multifocal pneumonia - completed 14 days antibiotics. --Monitor clinically --N.p.o. status with tube feeds by PEG --okay for couple ice chips AFTER oral care  (per bedside RN got choked trying this last evening, did better this AM - monitor closely!)   Acute respiratory failure with hypoxia -due to pneumonia.  Today on 3 L/min oxygen. Continue weaning, maintain sats above 90%.   Acute anxiety -appears situational in the setting of n.p.o. status and significant thirst. -- Low-dose as needed Xanax per tube while inpatient would avoid in the long-term   COPD with acute exacerbation -continue bronchodilators and prednisone.  Outpatient pulmonary follow-up for PFTs.  Type 2 diabetes -continue Levemir and sliding scale NovoLog   Chronic comorbidities: Hypertension, hyperlipidemia, seizure disorder, chronic back pain, cocaine abuse, tobacco use disorder.      Discharge Instructions   Discharge Instructions     Call MD for:  extreme fatigue   Complete by: As directed    Call MD for:  persistant dizziness or light-headedness   Complete by: As directed    Call MD for:  persistant nausea and vomiting   Complete by: As directed  Call MD for:  severe uncontrolled pain   Complete by: As directed    Call MD for:  temperature >100.4   Complete by: As directed    Diet - low sodium heart healthy   Complete by: As directed    Increase activity slowly   Complete by: As directed        Allergies as of 09/26/2020   No Known Allergies      Medication List     STOP taking these medications    alclomethasone 0.05 % cream Commonly known as: ACLOVATE   amLODipine 10 MG tablet Commonly known as: NORVASC   aspirin EC 81 MG tablet Replaced by: aspirin 81 MG chewable tablet   atorvastatin 80 MG tablet Commonly known as: LIPITOR   benazepril 10 MG tablet Commonly known as: LOTENSIN   carbamazepine 400 MG 12 hr tablet Commonly known as: TEGRETOL XR Replaced by: carBAMazepine 100 MG/5ML suspension   dextromethorphan-guaiFENesin 30-600 MG 12hr tablet Commonly known as: MUCINEX DM   food thickener Liqd Commonly known as: SIMPLYTHICK (HONEY/LEVEL 3/MODERATELY THICK)   gabapentin 300 MG capsule Commonly known as: NEURONTIN   insulin glargine 100 UNIT/ML injection Commonly known as: LANTUS   Janumet 50-1000 MG tablet Generic drug: sitaGLIPtin-metformin       TAKE these medications    acetaminophen 160 MG/5ML solution Commonly known as: TYLENOL Place 20.3 mLs (650 mg total) into feeding tube every 4 (four) hours as needed for moderate pain or fever.   albuterol 108 (90 Base) MCG/ACT inhaler Commonly known as: VENTOLIN HFA Inhale 2 puffs into the lungs every 6 (six) hours as needed for wheezing or shortness of breath.   albuterol (2.5 MG/3ML) 0.083% nebulizer solution Commonly known as: PROVENTIL Take 2.5 mg by nebulization every 4 (four) hours as needed for wheezing or shortness of breath.   ALPRAZolam 0.25 MG tablet Commonly known as: XANAX Take 1 tablet (0.25 mg total) by mouth 3 (three) times daily as needed for anxiety.   arformoterol 15 MCG/2ML Nebu Commonly known as: BROVANA Take 2 mLs (15 mcg total) by nebulization 2 (two) times daily.   aspirin 81 MG chewable tablet Place 1 tablet (81 mg total) into feeding tube daily. Start taking on: September 27, 2020 Replaces: aspirin EC 81 MG tablet   budesonide 0.5 MG/2ML nebulizer  solution Commonly known as: PULMICORT Take 2 mLs (0.5 mg total) by nebulization 2 (two) times daily.   carBAMazepine 100 MG/5ML suspension Commonly known as: TEGRETOL Place 10 mLs (200 mg total) into feeding tube every 6 (six) hours. Replaces: carbamazepine 400 MG 12 hr tablet   chlorhexidine 0.12 % solution Commonly known as: PERIDEX 15 mLs by Mouth Rinse route 2 (two) times daily.   clopidogrel 75 MG tablet Commonly known as: PLAVIX Place 1 tablet (75 mg total) into feeding tube daily. Start taking on: September 27, 2020   feeding supplement (OSMOLITE 1.5 CAL) Liqd Place 1,000 mLs into feeding tube continuous.   feeding supplement (PROSource TF) liquid Place 45 mLs into feeding tube daily. Start taking on: September 27, 2020   fenofibrate 160 MG tablet Take 160 mg by mouth daily.   Fish Oil 1000 MG Caps Take 500 mg by mouth 2 (two) times daily.   guaiFENesin 100 MG/5ML Soln Commonly known as: ROBITUSSIN Place 10 mLs (200 mg total) into feeding tube every 4 (four) hours.   HYDROcodone-acetaminophen 5-325 MG tablet Commonly known as: NORCO/VICODIN Take 1-2 tablets by mouth every 4 (four) hours as needed for moderate  pain.   insulin aspart 100 UNIT/ML injection Commonly known as: novoLOG Inject 0-20 Units into the skin every 4 (four) hours.   insulin aspart 100 UNIT/ML injection Commonly known as: novoLOG Inject 3 Units into the skin every 4 (four) hours.   insulin detemir 100 UNIT/ML injection Commonly known as: LEVEMIR Inject 0.3 mLs (30 Units total) into the skin 2 (two) times daily.   ipratropium-albuterol 0.5-2.5 (3) MG/3ML Soln Commonly known as: DUONEB Take 3 mLs by nebulization 2 (two) times daily.   liver oil-zinc oxide 40 % ointment Commonly known as: DESITIN Apply topically daily as needed for irritation (itchy rash).   melatonin 3 MG Tabs tablet Place 1 tablet (3 mg total) into feeding tube at bedtime as needed.   mouth rinse Liqd solution 15 mLs by  Mouth Rinse route 2 times daily at 12 noon and 4 pm.   nitroGLYCERIN 0.4 MG SL tablet Commonly known as: NITROSTAT Place 1 tablet (0.4 mg total) under the tongue every 5 (five) minutes as needed for chest pain.   omeprazole 40 MG capsule Commonly known as: PRILOSEC Take 1 capsule (40 mg total) by mouth 2 (two) times daily.   polyethylene glycol 17 g packet Commonly known as: MIRALAX / GLYCOLAX Place 17 g into feeding tube daily. Start taking on: September 27, 2020   rosuvastatin 20 MG tablet Commonly known as: CRESTOR Take 1 tablet (20 mg total) by mouth daily.        No Known Allergies   If you experience worsening of your admission symptoms, develop shortness of breath, life threatening emergency, suicidal or homicidal thoughts you must seek medical attention immediately by calling 911 or calling your MD immediately  if symptoms less severe.    Please note   You were cared for by a hospitalist during your hospital stay. If you have any questions about your discharge medications or the care you received while you were in the hospital after you are discharged, you can call the unit and asked to speak with the hospitalist on call if the hospitalist that took care of you is not available. Once you are discharged, your primary care physician will handle any further medical issues. Please note that NO REFILLS for any discharge medications will be authorized once you are discharged, as it is imperative that you return to your primary care physician (or establish a relationship with a primary care physician if you do not have one) for your aftercare needs so that they can reassess your need for medications and monitor your lab values.   Consultations: Neurology PCCM Interventional radiology   Procedures/Studies: CT ABDOMEN WO CONTRAST  Result Date: 09/13/2020 CLINICAL DATA:  Dysphagia, preop planning for gastrostomy placement EXAM: CT ABDOMEN WITHOUT CONTRAST TECHNIQUE:  Multidetector CT imaging of the abdomen was performed following the standard protocol without IV contrast. COMPARISON:  10/29/2019 FINDINGS: Lower chest: Dense consolidation in the visualized left lower lung. Patchy somewhat nodular airspace opacities in the right lower lung, new since previous. Hepatobiliary: Partially calcified gallstones measuring up to 10 mm in the nondilated gallbladder. No evident liver lesion or biliary ductal dilatation. Pancreas: Unremarkable. No pancreatic ductal dilatation or surrounding inflammatory changes. Spleen: Normal in size without focal abnormality. Adrenals/Urinary Tract: Adrenal glands are unremarkable. Kidneys are normal, without renal calculi, focal lesion, or hydronephrosis. Stomach/Bowel: The stomach is incompletely distended. Metallic clip near the duodenal bulb. Visualized portions of small bowel and colon are nondilated, unremarkable. Vascular/Lymphatic: Scattered calcified aortoiliac plaque without aneurysm. Other: No ascites.  No free air. Musculoskeletal: Left tenth and eleventh rib fracture deformities. No acute finding. IMPRESSION: 1. There is safe percutaneous window for gastrostomy placement. 2. Left lower lung consolidation. Patchy nodular densities in the right lung base possibly infectious/inflammatory. 3. Cholelithiasis 4.  Aortic Atherosclerosis (ICD10-170.0). Electronically Signed   By: Corlis Leak M.D.   On: 09/13/2020 07:01   CT CHEST WO CONTRAST  Result Date: 09/23/2020 CLINICAL DATA:  Pneumonia, effusion, or abscess suspected. EXAM: CT CHEST WITHOUT CONTRAST TECHNIQUE: Multidetector CT imaging of the chest was performed following the standard protocol without IV contrast. COMPARISON:  Chest radiograph 09/23/2020 and earlier; CT abdomen pelvis 09/12/2020 FINDINGS: Cardiovascular: Multivessel coronary artery disease. Mild calcific atherosclerosis of the aortic arch. Heart is normal in size. No abnormal pericardial fluid. Mediastinum/Nodes: Trace  secretions within the trachea at the level of the carina. Enlarged 1.2 cm subcarinal lymph node as well as a 1.0 cm subaortic lymph node, likely reactive. Lungs/Pleura: Mild paraseptal emphysema in the left upper lobe. No pleural effusion. No pneumothorax. Patchy peribronchovascular consolidations in the left lung, greatest in the left lower lobe (series 6, image 78). At the lateral aspect of the anterior right upper lobe, there is a 0.4 cm nodule versus patchy consolidation (series 6, image 76). Faint reticulonodular opacity in the medial aspect of the anterior right upper lobe (series 6, images 71-75, 88). Of note, multifocal patchy airspace opacities seen in the right middle and right lower lobes on CT abdomen dated 09/12/2020 have nearly completely resolved. Upper Abdomen: Enteric tube is visualized within the gastric antrum. Partially visualized gallstones, unchanged compared to 09/12/2020. Remainder of the visualized upper abdomen is unremarkable. Musculoskeletal: Old left lower and right lateral rib fractures. No suspicious osseous lesion. IMPRESSION: 1. Multifocal patchy peribronchovascular consolidations in the left lung, greatest in the left lower lobe, consistent with multifocal pneumonia. 2. A 0.4 cm pulmonary nodule versus patchy consolidation is noted in the anterior right upper lobe. Recommend attention on follow-up imaging. 3. Mediastinal lymphadenopathy, favored to be reactive in the setting of infection. 4. Multivessel coronary artery disease and calcific atherosclerosis of the aortic arch. 5. Mild left upper lobe emphysema. Aortic Atherosclerosis (ICD10-I70.0) and Emphysema (ICD10-J43.9). Electronically Signed   By: Sherron Ales MD   On: 09/23/2020 09:49   MR ANGIO HEAD WO CONTRAST  Result Date: 09/11/2020 CLINICAL DATA:  Neuro deficit, acute, stroke suspected. Additional history provided by technologist: Altered mental status. EXAM: MRI HEAD WITHOUT CONTRAST MRA HEAD WITHOUT CONTRAST  TECHNIQUE: Multiplanar, multi-echo pulse sequences of the brain and surrounding structures were acquired without intravenous contrast. Angiographic images of the Circle of Willis were acquired using MRA technique without intravenous contrast. COMPARISON:  Noncontrast head CT and CT angiogram head/neck 09/11/2020. MRI brain and MRA head 07/25/2020. FINDINGS: MRI HEAD FINDINGS Brain: The examination was prematurely terminated due to the patient's altered mental status and excessive motion degradation. Only axial and coronal diffusion-weighted imaging, a sagittal T1 weighted sequence, and an axial T2 TSE sequence were obtained. The acquired sequences are motion degraded. Most notably, there is severe motion degradation of the sagittal T1 weighted sequence and axial T2 TSE sequence. Mild generalized parenchymal atrophy. Two adjacent acute infarcts within the cingulate gyrus/callosal body measuring up to 8 mm. Restricted diffusion within the posterior limb of right internal capsule, increased in extent as compared to the brain MRI of 07/25/2020. This may reflect persistent restricted diffusion at site of a subacute infarction, or an acute on subacute infarct. 6 mm acute infarct within the right  parietal lobe subcortical white matter (series 5, image 20). Questionable punctate acute infarct within the left frontal lobe subcortical white matter (series 5, image 21). Known chronic cortical/subcortical infarcts within the high right frontal lobe and left temporal occipital lobes. Incompletely assessed severe chronic small vessel ischemic changes within the cerebral white matter. Known chronic right basal ganglia lacunar infarct. Within described limitations, no intracranial mass or extra-axial fluid collection is identified. No midline shift. Vascular: Flow voids poorly assessed due to the degree of motion degradation. Skull and upper cervical spine: Within described limitations, no focal suspicious marrow lesion is  identified. Sinuses/Orbits: No acute orbital abnormality is identified. Mild mucosal thickening within the left maxillary sinus. MRA HEAD FINDINGS The examination is significantly motion degraded, precluding adequate evaluation for intracranial arterial stenoses. No proximal arterial occlusion is identified within the intracranial internal carotid arteries, M1 middle cerebral arteries, proximal anterior cerebral arteries, intracranial vertebral arteries, basilar artery or proximal posterior cerebral arteries. The examination is the nondiagnostic more distally. 2 mm inferiorly projecting vascular protrusion arising from the cavernous left ICA, likely reflecting an aneurysm (series 1038, image 18). IMPRESSION: MRI brain: 1. Prematurely terminated and significantly motion degraded examination, as described and limiting evaluation. 2. Two adjacent acute infarcts within the right cingulate gyrus/callosal body (right ACA vascular territory). 3. Restricted diffusion within the posterior limb of right internal capsule, increased in extent as compared to the brain MRI of 07/25/2020. This may reflect persistent restricted diffusion at site of a known subacute infarct, or an acute on subacute infarct. 4. 6 mm acute infarct within the right parietal lobe subcortical white matter. 5. Questionable punctate acute infarct within the left frontal lobe subcortical white matter. 6. Given infarcts involving multiple vascular territories, consider an embolic process. 7. Known chronic cortical/subcortical infarcts within the high right frontal lobe and left occipital temporal lobes. 8. Incompletely assessed severe chronic small vessel ischemic changes within the cerebral white matter. 9. Known chronic right basal ganglia lacunar infarct. 10. Mild generalized parenchymal atrophy. MRA head: 1. Significantly motion degraded and limited examination, as described. 2. No proximal large vessel occlusion identified within the intracranial  internal carotid arteries, M1 middle cerebral arteries, proximal anterior cerebral arteries, intracranial vertebral arteries, basilar artery or proximal posterior cerebral arteries. The examination is non-diagnostic more distally. 3. 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 Electronically Signed   By: Jackey Loge DO   On: 09/11/2020 11:27   MR BRAIN WO CONTRAST  Result Date: 09/11/2020 CLINICAL DATA:  Neuro deficit, acute, stroke suspected. Additional history provided by technologist: Altered mental status. EXAM: MRI HEAD WITHOUT CONTRAST MRA HEAD WITHOUT CONTRAST TECHNIQUE: Multiplanar, multi-echo pulse sequences of the brain and surrounding structures were acquired without intravenous contrast. Angiographic images of the Circle of Willis were acquired using MRA technique without intravenous contrast. COMPARISON:  Noncontrast head CT and CT angiogram head/neck 09/11/2020. MRI brain and MRA head 07/25/2020. FINDINGS: MRI HEAD FINDINGS Brain: The examination was prematurely terminated due to the patient's altered mental status and excessive motion degradation. Only axial and coronal diffusion-weighted imaging, a sagittal T1 weighted sequence, and an axial T2 TSE sequence were obtained. The acquired sequences are motion degraded. Most notably, there is severe motion degradation of the sagittal T1 weighted sequence and axial T2 TSE sequence. Mild generalized parenchymal atrophy. Two adjacent acute infarcts within the cingulate gyrus/callosal body measuring up to 8 mm. Restricted diffusion within the posterior limb of right internal capsule, increased in extent  as compared to the brain MRI of 07/25/2020. This may reflect persistent restricted diffusion at site of a subacute infarction, or an acute on subacute infarct. 6 mm acute infarct within the right parietal lobe subcortical white matter (series 5, image 20). Questionable punctate acute  infarct within the left frontal lobe subcortical white matter (series 5, image 21). Known chronic cortical/subcortical infarcts within the high right frontal lobe and left temporal occipital lobes. Incompletely assessed severe chronic small vessel ischemic changes within the cerebral white matter. Known chronic right basal ganglia lacunar infarct. Within described limitations, no intracranial mass or extra-axial fluid collection is identified. No midline shift. Vascular: Flow voids poorly assessed due to the degree of motion degradation. Skull and upper cervical spine: Within described limitations, no focal suspicious marrow lesion is identified. Sinuses/Orbits: No acute orbital abnormality is identified. Mild mucosal thickening within the left maxillary sinus. MRA HEAD FINDINGS The examination is significantly motion degraded, precluding adequate evaluation for intracranial arterial stenoses. No proximal arterial occlusion is identified within the intracranial internal carotid arteries, M1 middle cerebral arteries, proximal anterior cerebral arteries, intracranial vertebral arteries, basilar artery or proximal posterior cerebral arteries. The examination is the nondiagnostic more distally. 2 mm inferiorly projecting vascular protrusion arising from the cavernous left ICA, likely reflecting an aneurysm (series 1038, image 18). IMPRESSION: MRI brain: 1. Prematurely terminated and significantly motion degraded examination, as described and limiting evaluation. 2. Two adjacent acute infarcts within the right cingulate gyrus/callosal body (right ACA vascular territory). 3. Restricted diffusion within the posterior limb of right internal capsule, increased in extent as compared to the brain MRI of 07/25/2020. This may reflect persistent restricted diffusion at site of a known subacute infarct, or an acute on subacute infarct. 4. 6 mm acute infarct within the right parietal lobe subcortical white matter. 5. Questionable  punctate acute infarct within the left frontal lobe subcortical white matter. 6. Given infarcts involving multiple vascular territories, consider an embolic process. 7. Known chronic cortical/subcortical infarcts within the high right frontal lobe and left occipital temporal lobes. 8. Incompletely assessed severe chronic small vessel ischemic changes within the cerebral white matter. 9. Known chronic right basal ganglia lacunar infarct. 10. Mild generalized parenchymal atrophy. MRA head: 1. Significantly motion degraded and limited examination, as described. 2. No proximal large vessel occlusion identified within the intracranial internal carotid arteries, M1 middle cerebral arteries, proximal anterior cerebral arteries, intracranial vertebral arteries, basilar artery or proximal posterior cerebral arteries. The examination is non-diagnostic more distally. 3. 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 Electronically Signed   By: Jackey Loge DO   On: 09/11/2020 11:27   IR GASTROSTOMY TUBE MOD SED  Result Date: 09/25/2020 CLINICAL DATA:  Shortness of breath, dysphagia with aspiration, needs enteral feeding support EXAM: PERC PLACEMENT GASTROSTOMY FLUOROSCOPY TIME:  3 minutes 24 seconds TECHNIQUE: The procedure, risks, benefits, and alternatives were explained to the patient. Questions regarding the procedure were encouraged and answered. The patient understands and consents to the procedure. As antibiotic prophylaxis, cefazolin 2 g was ordered pre-procedure and administered intravenously within one hour of incision. A safe percutaneous approach was confirmed on previous day's CT chest. A 5 French angiographic catheter was placed as orogastric tube. The upper abdomen was prepped with Betadine, draped in usual sterile fashion, and infiltrated locally with 1% lidocaine. Intravenous Fentanyl and Versed 1.5mg  were administered as conscious  sedation during continuous monitoring of the patient's level of consciousness and physiological / cardiorespiratory  status by the radiology RN, with a total moderate sedation time of 8 minutes. 0.5 mg glucagon given IV to facilitate gastric distention.Stomach was insufflated using air through the orogastric tube. An 55 French sheath needle was advanced percutaneously into the gastric lumen under fluoroscopy. Gas could be aspirated and a small contrast injection confirmed intraluminal spread. The sheath was exchanged over a guidewire for a 9 [place] vascular sheath, through which the snare device was advanced and used to snare a guidewire passed through the orogastric tube. This was withdrawn, and the snare attached to the 20 French pull-through gastrostomy tube, which was advanced antegrade, positioned with the internal bumper securing the anterior gastric wall to the anterior abdominal wall. Small contrast injection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 segments patent bilaterally. Mild atheromatous change within the carotid siphons without significant stenosis. A1 segments widely patent. Normal anterior communicating artery complex. Anterior cerebral arteries patent to their distal aspects without stenosis. No M1 stenosis  or occlusion. Normal MCA bifurcations. No visible proximal MCA branch occlusion. Distal MCA branches well perfused and symmetric. Posterior circulation: Both V4 segments patent to the vertebrobasilar junction  without stenosis. Right vertebral artery slightly dominant. Left PICA patent. Right PICA not seen. Basilar patent to its distal aspect without stenosis. Superior cerebral arteries patent bilaterally. Both PCAs primarily supplied via the basilar well perfused to their distal aspects. Venous sinuses: Grossly patent allowing for timing the contrast bolus. Anatomic variants: None significant.  No aneurysm. Review of the MIP images confirms the above findings CT Brain Perfusion Findings: ASPECTS: 10. CBF (<30%) Volume: 0mL Perfusion (Tmax>6.0s) volume: 7mL Mismatch Volume: 7mL Infarction Location:No acute core infarct by CT perfusion. 7 cc did area of delayed perfusion involving the posterior right temporal occipital region. IMPRESSION: CT HEAD IMPRESSION: 1. No acute intracranial abnormality. 2. Aspects = 10. 3. Age-related cerebral atrophy with advanced chronic microvascular ischemic disease, with multiple remote lacunar infarcts about the bilateral basal ganglia and thalami, with additional chronic right frontal infarct. CTA HEAD AND NECK IMPRESSION: 1. Negative CTA for emergent large vessel occlusion. 2. Atheromatous change about the carotid bifurcations with associated stenosis of up to 55% on the left and 35% on the right. 3. Additional mild atheromatous change elsewhere about the major arterial vasculature of the head and neck as above. No other proximal high-grade or correctable stenosis. 4. Mild scattered patchy and nodular densities within the partially visualized left upper lobe, suspicious for acute infection/pneumonia. 5.  Emphysema (ICD10-J43.9). CT PERFUSION IMPRESSION: 1. No evidence for acute core infarct by CT perfusion. 2. 7 cc area of delayed perfusion within the posterior right temporoccipital  region, which could reflect an area of all vein ischemia. Correlation with dedicated brain MRI suggested as warranted. Critical Value/emergent results were called by telephone at the time of interpretation on 09/11/2020 at 3:50 am to provider [facility] , who verbally acknowledged these results. Electronically Signed   By: Rise Mu M.D.   On: 09/11/2020 04:14   CT ANGIO HEAD NECK W WO CM W PERF (CODE STROKE)  Result Date: 09/11/2020 CLINICAL DATA:  Code stroke. Initial evaluation for acute left-sided weakness. EXAM: CT ANGIOGRAPHY HEAD AND NECK CT PERFUSION BRAIN TECHNIQUE: Multidetector CT imaging of the head and neck was performed using the standard protocol during bolus administration of intravenous contrast. Multiplanar CT image reconstructions and MIPs were obtained to evaluate the vascular anatomy. Carotid stenosis measurements (when applicable) are obtained utilizing NASCET criteria, using the distal internal carotid diameter as the denominator. Multiphase CT imaging of the brain was performed following IV bolus contrast injection. Subsequent parametric perfusion maps were calculated using RAPID software. CONTRAST:  OMNIPAQUE IOHEXOL 350 MG/ML SOLN COMPARISON:  Prior MRI from 07/25/2020. FINDINGS: CT HEAD FINDINGS Brain: Generalized cerebral atrophy with advanced chronic microvascular ischemic disease. Multiple remote lacunar infarcts present about the bilateral basal ganglia and thalami. Chronic right frontal infarct, right ACA distribution. No acute intracranial hemorrhage. No visible acute large vessel territory infarct. No mass lesion, mass effect, or midline shift. No hydrocephalus or extra-axial fluid collection. Vascular: No hyperdense vessel. Scattered vascular calcifications noted within the carotid siphons. Skull: Scalp soft tissues demonstrate no acute finding. Calvarium intact. Sinuses/Orbits: Globes and orbital soft tissues demonstrate no acute finding. Mild scattered  mucosal thickening noted within the ethmoidal air cells and maxillary sinuses. Paranasal sinuses are otherwise clear. No mastoid effusion. Other: None. ASPECTS (Alberta Stroke Program Early CT Score) - Ganglionic level infarction (caudate, lentiform nuclei, internal capsule, insula, M1-M3 cortex): 7 - Supraganglionic infarction (M4-M6 cortex): 3 Total score (0-10 with 10 being normal): 10 Review of the MIP images confirms the above findings CTA NECK FINDINGS Aortic  arch: Visualized aortic arch normal caliber with normal 3 vessel morphology. Mild atheromatous change about the arch and origin of the great vessels without hemodynamically significant stenosis. Right carotid system: Right CCA patent from its origin to the bifurcation without stenosis. Concentric mixed plaque about the right carotid bulb/proximal right ICA with mild 35% stenosis by NASCET criteria. Right ICA patent distally without stenosis, dissection or occlusion. Left carotid system: Scattered eccentric plaque within the mid left CCA with associated narrowing of up to 40% by NASCET criteria. Bulky calcified plaque about the left carotid bulb/proximal left ICA with associated stenosis of up to 55% by NASCET criteria. Left ICA patent distally without stenosis, dissection or occlusion. Vertebral arteries: Both vertebral arteries arise from the subclavian arteries. No proximal subclavian artery stenosis. Vertebral arteries patent without stenosis, dissection or occlusion. Skeleton: No visible acute osseous finding. No discrete or worrisome osseous lesions. Other neck: No other acute soft tissue abnormality within the neck. No mass or adenopathy. Retained metallic density noted within the subcutaneous fat at the right face. Upper chest: Emphysematous changes noted within the visualized lungs. Few scattered patchy densities noted within the posterior left upper lobe, suspicious for infection/pneumonia. Visualized upper chest demonstrates no other acute  finding. Review of the MIP images confirms the above findings CTA HEAD FINDINGS Anterior circulation: Petrous segments patent bilaterally. Mild atheromatous change within the carotid siphons without significant stenosis. A1 segments widely patent. Normal anterior communicating artery complex. Anterior cerebral arteries patent to their distal aspects without stenosis. No M1 stenosis or occlusion. Normal MCA bifurcations. No visible proximal MCA branch occlusion. Distal MCA branches well perfused and symmetric. Posterior circulation: Both V4 segments patent to the vertebrobasilar junction without stenosis. Right vertebral artery slightly dominant. Left PICA patent. Right PICA not seen. Basilar patent to its distal aspect without stenosis. Superior cerebral arteries patent bilaterally. Both PCAs primarily supplied via the basilar well perfused to their distal aspects. Venous sinuses: Grossly patent allowing for timing the contrast bolus. Anatomic variants: None significant.  No aneurysm. Review of the MIP images confirms the above findings CT Brain Perfusion Findings: ASPECTS: 10. CBF (<30%) Volume: 0mL Perfusion (Tmax>6.0s) volume: 7mL Mismatch Volume: 7mL Infarction Location:No acute core infarct by CT perfusion. 7 cc did area of delayed perfusion involving the posterior right temporal occipital region. IMPRESSION: CT HEAD IMPRESSION: 1. No acute intracranial abnormality. 2. Aspects = 10. 3. Age-related cerebral atrophy with advanced chronic microvascular ischemic disease, with multiple remote lacunar infarcts about the bilateral basal ganglia and thalami, with additional chronic right frontal infarct. CTA HEAD AND NECK IMPRESSION: 1. Negative CTA for emergent large vessel occlusion. 2. Atheromatous change about the carotid bifurcations with associated stenosis of up to 55% on the left and 35% on the right. 3. Additional mild atheromatous change elsewhere about the major arterial vasculature of the head and neck as  above. No other proximal high-grade or correctable stenosis. 4. Mild scattered patchy and nodular densities within the partially visualized left upper lobe, suspicious for acute infection/pneumonia. 5.  Emphysema (ICD10-J43.9). CT PERFUSION IMPRESSION: 1. No evidence for acute core infarct by CT perfusion. 2. 7 cc area of delayed perfusion within the posterior right temporoccipital region, which could reflect an area of all vein ischemia. Correlation with dedicated brain MRI suggested as warranted. Critical Value/emergent results were called by telephone at the time of interpretation on 09/11/2020 at 3:50 am to provider [facility] , who verbally acknowledged these results. Electronically Signed   By: Rise Mu M.D.   On:  09/11/2020 04:14    Core track feeding tube placement 09/16/2020 PEG tube placed 09/25/2018 few   Subjective: Pt asking "help me" when seen on rounds today.  He states he is very thirsty and begging for more ice chips.  Bedside RN reported SLP okay'd a few ice chips by mouth after oral care.  Last evening when this was tried with family present, patient got very choked up and dropped his O2 sat.  RN reports he did better with 2 ice chips this morning.  Pt does not endorse any other complaints, continues to request ice or water.    Discharge Exam: Vitals:   09/26/20 0838 09/26/20 1223  BP:  124/80  Pulse:  (!) 107  Resp:  20  Temp:  97.6 F (36.4 C)  SpO2: 92% 94%   Vitals:   09/26/20 0834 09/26/20 0837 09/26/20 0838 09/26/20 1223  BP:    124/80  Pulse:    (!) 107  Resp:    20  Temp:    97.6 F (36.4 C)  TempSrc:    Oral  SpO2: 91% 90% 92% 94%  Weight:      Height:        General: Pt is alert, awake, not in acute distress Cardiovascular: RRR, S1/S2 +, no rubs, no gallops Respiratory: CTA bilaterally, no wheezing, no rhonchi Abdominal: PEG tube in place with tube feeds running, Soft, NT, ND, bowel sounds + Extremities: no edema, no cyanosis Neuro:slurred  speech, left-sided weakness appears stable    The results of significant diagnostics from this hospitalization (including imaging, microbiology, ancillary and laboratory) are listed below for reference.     Microbiology: Recent Results (from the past 240 hour(s))  Culture, Respiratory w Gram Stain     Status: None   Collection Time: 09/17/20  8:27 AM   Specimen: Tracheal Aspirate; Respiratory  Result Value Ref Range Status   Specimen Description TRACHEAL ASPIRATE  Final   Special Requests NONE  Final   Gram Stain   Final    NO WBC SEEN ABUNDANT SQUAMOUS EPITHELIAL CELLS PRESENT RARE GRAM POSITIVE COCCI IN PAIRS    Culture   Final    FEW Normal respiratory flora-no Staph aureus or Pseudomonas seen Performed at [facility], [address], [place], [place] [address]    Report Status 09/19/2020 FINAL  Final  MRSA Next Gen by PCR, Nasal     Status: None   Collection Time: 09/17/20  4:00 PM   Specimen: Nasal Mucosa; Nasal Swab  Result Value Ref Range Status   MRSA by PCR Next Gen NOT DETECTED NOT DETECTED Final    Comment: (NOTE) The GeneXpert MRSA Assay (FDA approved for NASAL specimens only), is one component of a comprehensive MRSA colonization surveillance program. It is not intended to diagnose MRSA infection nor to guide or monitor treatment for MRSA infections. Test performance is not FDA approved in patients less than 50 years old. Performed at [facility], [address] [place], [place] [address]      Labs: BNP (last 3 results) Recent Labs    11/03/19 0918  BNP 107.0*   Basic Metabolic Panel: Recent Labs  Lab 09/20/20 0238 09/21/20 0346 09/23/20 0332  NA 145 143 139  K 3.9 4.1 4.0  CL 111 108 105  CO2 GLUCOSE 206* 209* 214*  BUN 19 18 23*  CREATININE 0.87 0.85 0.97  CALCIUM 9.3 9.3 9.5  MG 1.6* 1.7  --  PHOS  --  3.3  --    Liver Function Tests: No results for input(s): AST, ALT, ALKPHOS, BILITOT, PROT, ALBUMIN in the  last 168 hours. No results for input(s): LIPASE, AMYLASE in the last 168 hours. No results for input(s): AMMONIA in the last 168 hours. CBC: Recent Labs  Lab 09/20/20 0238 09/21/20 0346 09/24/20 0129 09/25/20 0638  WBC 10.5 13.0* 17.6* 13.5*  NEUTROABS  --   --   --  9.9*  HGB 14.2 13.7 14.1 13.3  HCT 42.1 41.6 43.1 40.0  MCV 96.1 94.8 95.6 96.4  PLT 322 300 251 268   Cardiac Enzymes: No results for input(s): CKTOTAL, CKMB, CKMBINDEX, TROPONINI in the last 168 hours. BNP: Invalid input(s): POCBNP CBG: Recent Labs  Lab 09/25/20 1924 09/25/20 2326 09/26/20 0400 09/26/20 0756 09/26/20 1213  GLUCAP 247* 212* 260* 173* 246*   D-Dimer No results for input(s): DDIMER in the last 72 hours. Hgb A1c No results for input(s): HGBA1C in the last 72 hours. Lipid Profile No results for input(s): CHOL, HDL, LDLCALC, TRIG, CHOLHDL, LDLDIRECT in the last 72 hours. Thyroid function studies No results for input(s): TSH, T4TOTAL, T3FREE, THYROIDAB in the last 72 hours.  Invalid input(s): FREET3 Anemia work up No results for input(s): VITAMINB12, FOLATE, FERRITIN, TIBC, IRON, RETICCTPCT in the last 72 hours. Urinalysis    Component Value Date/Time   COLORURINE YELLOW 09/11/2020 1110   APPEARANCEUR CLEAR 09/11/2020 1110   LABSPEC >1.046 (H) 09/11/2020 1110   PHURINE 5.0 09/11/2020 1110   GLUCOSEU NEGATIVE 09/11/2020 1110   HGBUR NEGATIVE 09/11/2020 1110   BILIRUBINUR NEGATIVE 09/11/2020 1110   KETONESUR NEGATIVE 09/11/2020 1110   PROTEINUR 30 (A) 09/11/2020 1110   NITRITE NEGATIVE 09/11/2020 1110   LEUKOCYTESUR NEGATIVE 09/11/2020 1110   Sepsis Labs Invalid input(s): PROCALCITONIN,  WBC,  LACTICIDVEN Microbiology Recent Results (from the past 240 hour(s))  Culture, Respiratory w Gram Stain     Status: None   Collection Time: 09/17/20  8:27 AM   Specimen: Tracheal Aspirate; Respiratory  Result Value Ref Range Status   Specimen Description TRACHEAL ASPIRATE  Final   Special  Requests NONE  Final   Gram Stain   Final    NO WBC SEEN ABUNDANT SQUAMOUS EPITHELIAL CELLS PRESENT RARE GRAM POSITIVE COCCI IN PAIRS    Culture   Final    FEW Normal respiratory flora-no Staph aureus or Pseudomonas seen Performed at [facility], [address] [place], [place] [address]    Report Status 09/19/2020 FINAL  Final  MRSA Next Gen by PCR, Nasal     Status: None   Collection Time: 09/17/20  4:00 PM   Specimen: Nasal Mucosa; Nasal Swab  Result Value Ref Range Status   MRSA by PCR Next Gen NOT DETECTED NOT DETECTED Final    Comment: (NOTE) The GeneXpert MRSA Assay (FDA approved for NASAL specimens only), is one component of a comprehensive MRSA colonization surveillance program. It is not intended to diagnose MRSA infection nor to guide or monitor treatment for MRSA infections. Test performance is not FDA approved in patients less than 27 years old. Performed at [facility], [address], [place], [place] [address]      Time coordinating discharge: Over 30 minutes  SIGNED:   Pennie Banter, DO [facility] 09/26/2020, 1:46 PM   If 7PM-7AM, please contact night-coverage [email or web address]

## 2020-09-26 NOTE — Progress Notes (Signed)
TRH short note -   Notified by bedside RN this afternoon, patient had pulled down his IV pole, and concern his PEG tube may have dislodged.    RN notes old-appearing blood coming out of the PEG on aspiration.  The PEG initially did flush well, but when tube feeds resumed, continued to have backflow of apparently blood, feeds were not able to run.   --Checking H&H --Abdominal xray to evaluate PEG tube --Hold tube feeds

## 2020-09-26 NOTE — Progress Notes (Addendum)
1230- RN was approached by NT stating that the IV pole was laying in the floor. Upon arrival pt stated it was a mistake but admitted to pushing IV pole over, knocking it onto the floor to try to get someone to come into his room to bring him something to drink. The IV pole was on the pts left side of the chair and pt is only able to use his right side. Pt is also NPO and only allowed 4-5 ice chips after oral care, which had already been provided to him this morning. Pt was able to state to RN that he knows that he can not have water and is only able to eat ice chips. Mittens placed on pt, PRN anxiety medication given, and pt assisted back into bed.   1530- Pt yelling out into hallway. Upon arrival to room, pt had pulled onto IV pole again, knocking it onto the ground. The tube feeding bag had busted open and the line to the Osmolite had been pulled out of the bottle. Pt stated he again did not mean to do it. RN was able to flush PEG tube with no resistance but when aspirated, what appeared as old blood was aspirated. MD notified. Tube feedings restarted. Clydie Braun, significant other, called and updated on pts status. Stated his daughter and niece were on the way to visit and should be here soon.   1700- family called RN into room. Pts PEG tube had leaked what appeared to be a large amount of old blood and tube feedings onto bed. Because of the backflow of the blood through the PEG tube, tube feedings were not able to run. MD notified. New orders for PEG tube placement. CIR nurse notified of current situation.   51- Spoke with RN from [facility]/Rehab that stated that results from placement verification were needed prior to admitting pt.

## 2020-09-26 NOTE — H&P (Signed)
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  Per chart review patient lives with spouse.  1 level home with level entry.  Presented 09/11/2020 with acute onset of right-sided weakness and dysarthria.  Admission chemistries unremarkable except glucose 135, alcohol negative, urine drug screen positive cocaine, hemoglobin A1c 6.4.  Cranial CT scan negative.  CTA head and neck negative for emergent large vessel occlusion.  MRI showed 2 adjacent acute infarcts within the right cingulate gyrus/colossal body(right ACA vascular territory) as well as 6 mm acute infarct within the right parietal lobe subcortical white matter and questionable punctate acute infarct within the left frontal lobe subcortical white matter.  Echocardiogram with ejection fraction 65 to 70% no wall motion abnormalities.  Currently maintained on aspirin 81 mg daily and Plavix 75 mg daily for CVA prophylaxis.  Subcutaneous Lovenox for DVT prophylaxis.  Currently n.p.o. with alternative means of nutritional support with gastrostomy tube placed 09/24/2020 per interventional radiology Dr. Deanne CofferHassell.Marland Kitchen.  Hospital course complicated by sepsis secondary to aspiration pneumonia completed 14-day course of antibiotics and slowly weaned from oxygen.  Palliative care was consulted to establish goals of care.  Therapy evaluations completed due to patient's right side weakness and dysarthria was admitted for a comprehensive rehab program. Currently complaining of leg pain.   Review of Systems  Constitutional:  Negative for chills and fever.  HENT:  Negative for hearing loss.   Eyes:  Negative for blurred vision  and double vision.  Respiratory:  Negative for cough and shortness of breath.   Cardiovascular:  Negative for chest pain, palpitations and leg swelling.  Gastrointestinal:  Positive for constipation. Negative for heartburn, nausea and vomiting.  Genitourinary:  Negative for dysuria, flank pain and hematuria.  Musculoskeletal:  Positive for myalgias.  Skin:  Negative for rash.  Neurological:  Positive for speech change and weakness.  Psychiatric/Behavioral:         Anxiety  All other systems reviewed and are negative. Past Medical History:  Diagnosis Date   Aplastic anemia, unspecified (HCC)    Chest pain, unspecified    Chronic airway obstruction, not elsewhere classified    Cocaine substance abuse (HCC)    Neuropathy    Other and unspecified hyperlipidemia    Shortness of breath    Stroke (HCC) 06/17/2020   Tobacco use disorder    Type II or unspecified type diabetes mellitus without mention of complication, not stated as uncontrolled    Unspecified epilepsy without mention of intractable epilepsy    Unspecified essential hypertension    Past Surgical History:  Procedure Laterality Date   CARDIAC CATHETERIZATION  10/02/2010    Nonobstructive mild coronary plaque   IR GASTROSTOMY TUBE MOD SED  09/24/2020   KNEE SURGERY Bilateral 2018   Repair   PERFORATED VISCUS SURGERY     MULTIPLE FRACTURES   Family History  Problem Relation Age of Onset   Heart attack Father    COPD Father    Coronary artery disease Mother    Social History:  reports that he has been smoking cigarettes. He has a 30.00 pack-year smoking history. He has never used smokeless tobacco. He reports current alcohol use. He reports  that he does not use drugs. Allergies: No Known Allergies Medications Prior to Admission  Medication Sig Dispense Refill   albuterol (PROVENTIL) (2.5 MG/3ML) 0.083% nebulizer solution Take 2.5 mg by nebulization every 4 (four) hours as needed for wheezing or shortness of breath.      albuterol (VENTOLIN HFA) 108 (90 Base) MCG/ACT inhaler Inhale 2 puffs into the lungs every 6 (six) hours as needed for wheezing or shortness of breath.     alclomethasone (ACLOVATE) 0.05 % cream Apply 1 application topically 2 (two) times daily.     amLODipine (NORVASC) 10 MG tablet Take 10 mg by mouth daily.     aspirin EC 81 MG tablet Take 81 mg by mouth daily. Swallow whole.     atorvastatin (LIPITOR) 80 MG tablet Take 80 mg by mouth daily.     benazepril (LOTENSIN) 10 MG tablet Take 20 mg by mouth daily.     carbamazepine (TEGRETOL XR) 400 MG 12 hr tablet Take 1 tablet (400 mg total) by mouth 2 (two) times daily. 60 tablet 6   fenofibrate 160 MG tablet Take 160 mg by mouth daily.     gabapentin (NEURONTIN) 300 MG capsule Take 3 caps in AM, 3 caps at noon, 4 caps at bedtime (Patient taking differently: Take 900-1,200 mg by mouth 3 (three) times daily. Take 3 caps in AM, 3 caps at noon, 4 caps at bedtime) 300 capsule 6   insulin glargine (LANTUS) 100 UNIT/ML injection Inject 70 Units into the skin daily as needed (depending on sugar level).     JANUMET 50-1000 MG tablet Take 1 tablet by mouth 2 (two) times daily.     Omega-3 Fatty Acids (FISH OIL) 1000 MG CAPS Take 500 mg by mouth 2 (two) times daily.     omeprazole (PRILOSEC) 40 MG capsule Take 1 capsule (40 mg total) by mouth 2 (two) times daily. 60 capsule 5   rosuvastatin (CRESTOR) 20 MG tablet Take 1 tablet (20 mg total) by mouth daily. 30 tablet 2   dextromethorphan-guaiFENesin (MUCINEX DM) 30-600 MG 12hr tablet Take 1 tablet by mouth 2 (two) times daily. (Patient not taking: Reported on 09/11/2020) 20 tablet 0   food thickener (SIMPLYTHICK, HONEY/LEVEL 3/MODERATELY THICK,) LIQD Take 1 packet by mouth as needed. 100 packet 2    Drug Regimen Review Drug regimen was reviewed and remains appropriate with no significant issues identified  Home: Home Living Family/patient expects to be discharged to:: Private residence Living Arrangements:   (girlfriend of 30 years) Available Help at Discharge: Family, Available 24 hours/day Type of Home: House Home Access: Level entry Home Layout: One level Bathroom Shower/Tub: Engineer, manufacturing systems: Handicapped height Bathroom Accessibility: Yes Home Equipment: Government social research officer, Environmental consultant - 2 wheels, Bedside commode, Shower seat, Grab bars - toilet, Cane - quad, Wheelchair - manual, Other (comment)  Lives With: Significant other   Functional History: Prior Function Level of Independence: Needs assistance Gait / Transfers Assistance Needed: Patient states RW use for ambulation, family assists PRN ADL's / Homemaking Assistance Needed: Assisted by family Communication / Swallowing Assistance Needed: mild slurred speech Comments: Patient states RW use for ambulation, family assists PRN  Functional Status:  Mobility: Bed Mobility Overal bed mobility: Needs Assistance Bed Mobility: Supine to Sit, Rolling Rolling: Mod assist Sidelying to sit: Mod assist Supine to sit: Mod assist, HOB elevated, +2 for physical assistance Sit to supine: Mod assist, +2 for physical assistance Sit to sidelying: Mod assist, +2 for physical assistance General bed mobility comments:  Pt sitting up in chair Transfers Overall transfer level: Needs assistance Equipment used: 2 person hand held assist Transfer via Lift Equipment: Stedy Transfers: Sit to/from Stand, [employer] Transfers Sit to Stand: Mod assist, +2 physical assistance, From elevated surface Stand pivot transfers: +2 physical assistance, Max assist, +2 safety/equipment (+3 one time) General transfer comment: Pt up in chair Ambulation/Gait General Gait Details: unable at this time.    ADL: ADL Overall ADL's : Needs assistance/impaired Eating/Feeding: Minimal assistance, Sitting Eating/Feeding Details (indicate cue type and reason): Pt required assist to scoop ice chips onto spoon, but able to move spoon to mouth.  Oral care performed prior  to administration of ice chips Grooming: Moderate assistance, Sitting Grooming Details (indicate cue type and reason): Pt requires supervision and mod verbal cues to wash face (for thoroughness), supervision and mod verbal cues to sequence and attend to brushing teeth, and max A to wash bil. hands. Upper Body Bathing: Maximal assistance Lower Body Bathing: Maximal assistance Upper Body Dressing : Moderate assistance, Sitting Lower Body Dressing: Total assistance, Bed level Lower Body Dressing Details (indicate cue type and reason): To don footwear Toilet Transfer: Total assistance Toilet Transfer Details (indicate cue type and reason): Use of Stedy and Mod A +2. Toileting- Clothing Manipulation and Hygiene: Maximal assistance, Total assistance Tub/ Shower Transfer: Maximal assistance Functional mobility during ADLs: Maximal assistance General ADL Comments: Patient greatly limited by L hemiplegia, visual deficits, increased need for supplemental O2 via HHFNC, decreased static sitting balance and need for +2 assist for all bed mobility and functional transfers grossly.  Cognition: Cognition Overall Cognitive Status: Impaired/Different from baseline Arousal/Alertness: Awake/alert Orientation Level: Oriented X4 Attention: Focused Memory: Appears intact Awareness: Appears intact Executive Function: Reasoning, Organizing Cognition Arousal/Alertness: Awake/alert Behavior During Therapy: Impulsive, Restless Overall Cognitive Status: Impaired/Different from baseline Area of Impairment: Attention, Following commands, Memory, Safety/judgement, Awareness, Problem solving Current Attention Level: Sustained (with moderate cues) Memory: Decreased short-term memory, Decreased recall of precautions Following Commands: Follows one step commands inconsistently, Follows one step commands with increased time Safety/Judgement: Decreased awareness of safety, Decreased awareness of deficits Awareness:  Intellectual Problem Solving: Slow processing, Decreased initiation, Difficulty sequencing, Requires tactile cues General Comments: Pt with very poor attention, and is very easily distracted.  He requires moderate cues to sustain attention to familiar grooming tasks.  due to attentional deficits, he requires max cues for sequencing and problem solving  Physical Exam: Blood pressure 135/85, pulse (!) 105, temperature 97.7 F (36.5 C), temperature source Oral, resp. rate 18, height 6' (1.829 m), weight 83 kg, SpO2 92 %. Physical Exam Gen: no distress, normal appearing HEENT: oral mucosa pink and moist, NCAT Cardio: Tachycardic Chest: normal effort, normal rate of breathing Abdominal:     Comments: Gastrostomy tube in place  Neurological:     Comments: Patient is alert.  Appears a bit anxious.  Makes eye contact with examiner.  Speech is dysarthric.  Provides name and follow simple commands. LLE is 3/5 with decreased sensation throughout. LUE tremor present with activity. Poor safety awareness, impulsive.   Results for orders placed or performed during the hospital encounter of 09/11/20 (from the past 48 hour(s))  Glucose, capillary     Status: Abnormal   Collection Time: 09/24/20  3:19 PM  Result Value Ref Range   Glucose-Capillary 106 (H) 70 - 99 mg/dL    Comment: Glucose reference range applies only to samples taken after fasting for at least 8 hours.  Glucose, capillary     Status: Abnormal  Collection Time: 09/24/20  7:28 PM  Result Value Ref Range   Glucose-Capillary 115 (H) 70 - 99 mg/dL    Comment: Glucose reference range applies only to samples taken after fasting for at least 8 hours.  Glucose, capillary     Status: Abnormal   Collection Time: 09/24/20 11:34 PM  Result Value Ref Range   Glucose-Capillary 154 (H) 70 - 99 mg/dL    Comment: Glucose reference range applies only to samples taken after fasting for at least 8 hours.  Glucose, capillary     Status: Abnormal    Collection Time: 09/25/20  3:14 AM  Result Value Ref Range   Glucose-Capillary 255 (H) 70 - 99 mg/dL    Comment: Glucose reference range applies only to samples taken after fasting for at least 8 hours.  Glucose, capillary     Status: Abnormal   Collection Time: 09/25/20  4:06 AM  Result Value Ref Range   Glucose-Capillary 178 (H) 70 - 99 mg/dL    Comment: Glucose reference range applies only to samples taken after fasting for at least 8 hours.  CBC with Differential/Platelet     Status: Abnormal   Collection Time: 09/25/20  6:38 AM  Result Value Ref Range   WBC 13.5 (H) 4.0 - 10.5 K/uL   RBC 4.15 (L) 4.22 - 5.81 MIL/uL   Hemoglobin 13.3 13.0 - 17.0 g/dL   HCT 95.1 88.4 - 16.6 %   MCV 96.4 80.0 - 100.0 fL   MCH 32.0 26.0 - 34.0 pg   MCHC 33.3 30.0 - 36.0 g/dL   RDW 06.3 01.6 - 01.0 %   Platelets 268 150 - 400 K/uL   nRBC 0.0 0.0 - 0.2 %   Neutrophils Relative % 72 %   Neutro Abs 9.9 (H) 1.7 - 7.7 K/uL   Lymphocytes Relative 16 %   Lymphs Abs 2.1 0.7 - 4.0 K/uL   Monocytes Relative 7 %   Monocytes Absolute 0.9 0.1 - 1.0 K/uL   Eosinophils Relative 3 %   Eosinophils Absolute 0.4 0.0 - 0.5 K/uL   Basophils Relative 1 %   Basophils Absolute 0.1 0.0 - 0.1 K/uL   Immature Granulocytes 1 %   Abs Immature Granulocytes 0.07 0.00 - 0.07 K/uL    Comment: Performed at [facility], [address], [place], [place] [address]  Glucose, capillary     Status: Abnormal   Collection Time: 09/25/20  7:53 AM  Result Value Ref Range   Glucose-Capillary 211 (H) 70 - 99 mg/dL    Comment: Glucose reference range applies only to samples taken after fasting for at least 8 hours.  Glucose, capillary     Status: Abnormal   Collection Time: 09/25/20 11:54 AM  Result Value Ref Range   Glucose-Capillary 116 (H) 70 - 99 mg/dL    Comment: Glucose reference range applies only to samples taken after fasting for at least 8 hours.  Glucose, capillary     Status: Abnormal   Collection Time: 09/25/20   4:15 PM  Result Value Ref Range   Glucose-Capillary 238 (H) 70 - 99 mg/dL    Comment: Glucose reference range applies only to samples taken after fasting for at least 8 hours.  Glucose, capillary     Status: Abnormal   Collection Time: 09/25/20  7:24 PM  Result Value Ref Range   Glucose-Capillary 247 (H) 70 - 99 mg/dL    Comment: Glucose reference range applies only to samples taken after fasting for  at least 8 hours.  Glucose, capillary     Status: Abnormal   Collection Time: 09/25/20 11:26 PM  Result Value Ref Range   Glucose-Capillary 212 (H) 70 - 99 mg/dL    Comment: Glucose reference range applies only to samples taken after fasting for at least 8 hours.  Glucose, capillary     Status: Abnormal   Collection Time: 09/26/20  4:00 AM  Result Value Ref Range   Glucose-Capillary 260 (H) 70 - 99 mg/dL    Comment: Glucose reference range applies only to samples taken after fasting for at least 8 hours.  Glucose, capillary     Status: Abnormal   Collection Time: 09/26/20  7:56 AM  Result Value Ref Range   Glucose-Capillary 173 (H) 70 - 99 mg/dL    Comment: Glucose reference range applies only to samples taken after fasting for at least 8 hours.   No results found.     Medical Problem List and Plan: 1.   Right side weakness and dysarthria secondary to 2 adjacent acute infarctions in the right cingulate gyrus/colossal body(right ACA vascular territory) as well as infarct right parietal lobe and subcortical white matter as well as history of 6/22  -patient may shower  -ELOS/Goals: Min A 2 weeks  -Admit to CIR 2.  Impaired mobility: -DVT/anticoagulation:  Pharmaceutical: Lovenox  -antiplatelet therapy: Continue Aspirin 81 mg daily and Plavix 75 mg dailyx 3 weeks 3. Pain Management: d/c IV Dilaudid. Hydrocodone as needed 4. Anxiety: Continue Xanax as needed   -antipsychotic agents: N/A 5. Neuropsych: This patient is capable of making decisions on his own behalf. 6. Skin/Wound Care:  Routine skin checks 7. Fluids/Electrolytes/Nutrition: Routine in and outs with follow-up chemistries 8.  Dysphagia.  Status postgastrostomy tube 09/24/2020 per interventional radiology.SLP follow up 9.Cociane/tobacco abuse.UDS positive cocaine. 10.  Diabetes mellitus.  Hemoglobin A1c 6.4.  Follow-up 3 units every 4 hours and Levemir 30 units twice daily.  Diabetic teaching 11.  Hyperlipidemia.  Crestor/fenofibrate 12.Aspiration pneumonia.  Completed 14 days antibiotic 13.  Constipation.  MiraLAX  I have personally performed a face to face diagnostic evaluation, including, but not limited to relevant history and physical exam findings, of this patient and developed relevant assessment and plan.  Additionally, I have reviewed and concur with the physician assistant's documentation above.  Sula Soda, MD   Mcarthur Rossetti Angiulli, PA-C 09/26/2020

## 2020-09-27 ENCOUNTER — Inpatient Hospital Stay (HOSPITAL_COMMUNITY)
Admission: RE | Admit: 2020-09-27 | Discharge: 2020-10-18 | DRG: 057 | Disposition: A | Discharge status: Skilled Nursing Facility | Payer: Medicaid Other | Source: Intra-hospital | Attending: Physical Medicine & Rehabilitation | Admitting: Physical Medicine & Rehabilitation

## 2020-09-27 ENCOUNTER — Encounter (HOSPITAL_COMMUNITY): Payer: Self-pay | Admitting: Physical Medicine & Rehabilitation

## 2020-09-27 ENCOUNTER — Other Ambulatory Visit: Payer: Self-pay

## 2020-09-27 DIAGNOSIS — F17210 Nicotine dependence, cigarettes, uncomplicated: Secondary | ICD-10-CM | POA: Diagnosis present

## 2020-09-27 DIAGNOSIS — E1165 Type 2 diabetes mellitus with hyperglycemia: Secondary | ICD-10-CM | POA: Diagnosis not present

## 2020-09-27 DIAGNOSIS — K219 Gastro-esophageal reflux disease without esophagitis: Secondary | ICD-10-CM | POA: Diagnosis present

## 2020-09-27 DIAGNOSIS — G40909 Epilepsy, unspecified, not intractable, without status epilepticus: Secondary | ICD-10-CM | POA: Diagnosis present

## 2020-09-27 DIAGNOSIS — D72829 Elevated white blood cell count, unspecified: Secondary | ICD-10-CM

## 2020-09-27 DIAGNOSIS — Z86010 Personal history of colonic polyps: Secondary | ICD-10-CM

## 2020-09-27 DIAGNOSIS — E785 Hyperlipidemia, unspecified: Secondary | ICD-10-CM | POA: Diagnosis present

## 2020-09-27 DIAGNOSIS — K922 Gastrointestinal hemorrhage, unspecified: Secondary | ICD-10-CM | POA: Diagnosis not present

## 2020-09-27 DIAGNOSIS — Z794 Long term (current) use of insulin: Secondary | ICD-10-CM

## 2020-09-27 DIAGNOSIS — E1169 Type 2 diabetes mellitus with other specified complication: Secondary | ICD-10-CM | POA: Diagnosis present

## 2020-09-27 DIAGNOSIS — D72823 Leukemoid reaction: Secondary | ICD-10-CM | POA: Diagnosis not present

## 2020-09-27 DIAGNOSIS — Z66 Do not resuscitate: Secondary | ICD-10-CM | POA: Diagnosis present

## 2020-09-27 DIAGNOSIS — K3189 Other diseases of stomach and duodenum: Secondary | ICD-10-CM | POA: Diagnosis present

## 2020-09-27 DIAGNOSIS — I69391 Dysphagia following cerebral infarction: Secondary | ICD-10-CM | POA: Diagnosis not present

## 2020-09-27 DIAGNOSIS — N179 Acute kidney failure, unspecified: Secondary | ICD-10-CM | POA: Diagnosis present

## 2020-09-27 DIAGNOSIS — E119 Type 2 diabetes mellitus without complications: Secondary | ICD-10-CM

## 2020-09-27 DIAGNOSIS — I10 Essential (primary) hypertension: Secondary | ICD-10-CM | POA: Diagnosis present

## 2020-09-27 DIAGNOSIS — Z781 Physical restraint status: Secondary | ICD-10-CM

## 2020-09-27 DIAGNOSIS — Z8249 Family history of ischemic heart disease and other diseases of the circulatory system: Secondary | ICD-10-CM

## 2020-09-27 DIAGNOSIS — Z825 Family history of asthma and other chronic lower respiratory diseases: Secondary | ICD-10-CM

## 2020-09-27 DIAGNOSIS — K9421 Gastrostomy hemorrhage: Secondary | ICD-10-CM | POA: Diagnosis present

## 2020-09-27 DIAGNOSIS — Z7189 Other specified counseling: Secondary | ICD-10-CM | POA: Diagnosis not present

## 2020-09-27 DIAGNOSIS — K921 Melena: Secondary | ICD-10-CM | POA: Diagnosis not present

## 2020-09-27 DIAGNOSIS — I69354 Hemiplegia and hemiparesis following cerebral infarction affecting left non-dominant side: Secondary | ICD-10-CM | POA: Diagnosis not present

## 2020-09-27 DIAGNOSIS — I69351 Hemiplegia and hemiparesis following cerebral infarction affecting right dominant side: Secondary | ICD-10-CM | POA: Diagnosis present

## 2020-09-27 DIAGNOSIS — Z79899 Other long term (current) drug therapy: Secondary | ICD-10-CM

## 2020-09-27 DIAGNOSIS — Z20822 Contact with and (suspected) exposure to covid-19: Secondary | ICD-10-CM | POA: Diagnosis present

## 2020-09-27 DIAGNOSIS — M545 Low back pain, unspecified: Secondary | ICD-10-CM | POA: Diagnosis present

## 2020-09-27 DIAGNOSIS — Z931 Gastrostomy status: Secondary | ICD-10-CM

## 2020-09-27 DIAGNOSIS — F1410 Cocaine abuse, uncomplicated: Secondary | ICD-10-CM | POA: Diagnosis present

## 2020-09-27 DIAGNOSIS — J449 Chronic obstructive pulmonary disease, unspecified: Secondary | ICD-10-CM | POA: Diagnosis present

## 2020-09-27 DIAGNOSIS — IMO0002 Reserved for concepts with insufficient information to code with codable children: Secondary | ICD-10-CM

## 2020-09-27 DIAGNOSIS — D649 Anemia, unspecified: Secondary | ICD-10-CM | POA: Diagnosis present

## 2020-09-27 DIAGNOSIS — R5381 Other malaise: Secondary | ICD-10-CM | POA: Diagnosis not present

## 2020-09-27 DIAGNOSIS — I69322 Dysarthria following cerebral infarction: Secondary | ICD-10-CM | POA: Diagnosis not present

## 2020-09-27 DIAGNOSIS — K5900 Constipation, unspecified: Secondary | ICD-10-CM | POA: Diagnosis present

## 2020-09-27 DIAGNOSIS — R7309 Other abnormal glucose: Secondary | ICD-10-CM

## 2020-09-27 DIAGNOSIS — E440 Moderate protein-calorie malnutrition: Secondary | ICD-10-CM | POA: Diagnosis present

## 2020-09-27 DIAGNOSIS — E870 Hyperosmolality and hypernatremia: Secondary | ICD-10-CM | POA: Diagnosis present

## 2020-09-27 DIAGNOSIS — K222 Esophageal obstruction: Secondary | ICD-10-CM | POA: Diagnosis not present

## 2020-09-27 DIAGNOSIS — R1312 Dysphagia, oropharyngeal phase: Secondary | ICD-10-CM | POA: Diagnosis present

## 2020-09-27 DIAGNOSIS — K449 Diaphragmatic hernia without obstruction or gangrene: Secondary | ICD-10-CM | POA: Diagnosis present

## 2020-09-27 DIAGNOSIS — I63321 Cerebral infarction due to thrombosis of right anterior cerebral artery: Secondary | ICD-10-CM

## 2020-09-27 DIAGNOSIS — D62 Acute posthemorrhagic anemia: Secondary | ICD-10-CM | POA: Diagnosis not present

## 2020-09-27 DIAGNOSIS — R0603 Acute respiratory distress: Secondary | ICD-10-CM | POA: Diagnosis not present

## 2020-09-27 DIAGNOSIS — I639 Cerebral infarction, unspecified: Secondary | ICD-10-CM | POA: Diagnosis present

## 2020-09-27 DIAGNOSIS — G4733 Obstructive sleep apnea (adult) (pediatric): Secondary | ICD-10-CM | POA: Diagnosis present

## 2020-09-27 DIAGNOSIS — E11649 Type 2 diabetes mellitus with hypoglycemia without coma: Secondary | ICD-10-CM | POA: Diagnosis present

## 2020-09-27 DIAGNOSIS — Z4659 Encounter for fitting and adjustment of other gastrointestinal appliance and device: Secondary | ICD-10-CM

## 2020-09-27 DIAGNOSIS — F4323 Adjustment disorder with mixed anxiety and depressed mood: Secondary | ICD-10-CM

## 2020-09-27 DIAGNOSIS — R45851 Suicidal ideations: Secondary | ICD-10-CM | POA: Diagnosis not present

## 2020-09-27 DIAGNOSIS — Z515 Encounter for palliative care: Secondary | ICD-10-CM | POA: Diagnosis not present

## 2020-09-27 DIAGNOSIS — Z7982 Long term (current) use of aspirin: Secondary | ICD-10-CM

## 2020-09-27 LAB — CBC
HCT: 34.5 % — ABNORMAL LOW (ref 39.0–52.0)
Hemoglobin: 11.4 g/dL — ABNORMAL LOW (ref 13.0–17.0)
MCH: 32.1 pg (ref 26.0–34.0)
MCHC: 33 g/dL (ref 30.0–36.0)
MCV: 97.2 fL (ref 80.0–100.0)
Platelets: 276 10*3/uL (ref 150–400)
RBC: 3.55 MIL/uL — ABNORMAL LOW (ref 4.22–5.81)
RDW: 13.2 % (ref 11.5–15.5)
WBC: 22.9 10*3/uL — ABNORMAL HIGH (ref 4.0–10.5)
nRBC: 0 % (ref 0.0–0.2)

## 2020-09-27 LAB — GLUCOSE, CAPILLARY
Glucose-Capillary: 141 mg/dL — ABNORMAL HIGH (ref 70–99)
Glucose-Capillary: 144 mg/dL — ABNORMAL HIGH (ref 70–99)
Glucose-Capillary: 149 mg/dL — ABNORMAL HIGH (ref 70–99)
Glucose-Capillary: 155 mg/dL — ABNORMAL HIGH (ref 70–99)
Glucose-Capillary: 215 mg/dL — ABNORMAL HIGH (ref 70–99)
Glucose-Capillary: 252 mg/dL — ABNORMAL HIGH (ref 70–99)

## 2020-09-27 LAB — BASIC METABOLIC PANEL
Anion gap: 8 (ref 5–15)
BUN: 71 mg/dL — ABNORMAL HIGH (ref 6–20)
CO2: 25 mmol/L (ref 22–32)
Calcium: 9.9 mg/dL (ref 8.9–10.3)
Chloride: 112 mmol/L — ABNORMAL HIGH (ref 98–111)
Creatinine, Ser: 1.4 mg/dL — ABNORMAL HIGH (ref 0.61–1.24)
GFR, Estimated: 59 mL/min — ABNORMAL LOW (ref >60)
Glucose, Bld: 168 mg/dL — ABNORMAL HIGH (ref 70–99)
Potassium: 5 mmol/L (ref 3.5–5.1)
Sodium: 145 mmol/L (ref 135–145)

## 2020-09-27 MED ORDER — ASPIRIN 81 MG PO CHEW
81.0000 mg | CHEWABLE_TABLET | Freq: Every day | ORAL | Status: DC
  Administered 2020-09-28 – 2020-10-01 (×4): 81 mg
  Filled 2020-09-27 (×4): qty 1

## 2020-09-27 MED ORDER — CARBAMAZEPINE 100 MG/5ML PO SUSP
200.0000 mg | Freq: Four times a day (QID) | ORAL | Status: DC
  Administered 2020-09-27 – 2020-10-18 (×80): 200 mg
  Filled 2020-09-27 (×90): qty 10

## 2020-09-27 MED ORDER — ENOXAPARIN SODIUM 40 MG/0.4ML IJ SOSY
40.0000 mg | PREFILLED_SYRINGE | INTRAMUSCULAR | Status: DC
  Administered 2020-09-27 – 2020-09-30 (×4): 40 mg via SUBCUTANEOUS
  Filled 2020-09-27 (×4): qty 0.4

## 2020-09-27 MED ORDER — ALPRAZOLAM 0.25 MG PO TABS
0.2500 mg | ORAL_TABLET | Freq: Three times a day (TID) | ORAL | Status: DC | PRN
  Administered 2020-09-28 – 2020-10-18 (×45): 0.25 mg
  Filled 2020-09-27 (×45): qty 1

## 2020-09-27 MED ORDER — MELATONIN 3 MG PO TABS
3.0000 mg | ORAL_TABLET | Freq: Every evening | ORAL | Status: DC | PRN
  Administered 2020-09-27 – 2020-10-17 (×20): 3 mg
  Filled 2020-09-27 (×21): qty 1

## 2020-09-27 MED ORDER — IPRATROPIUM-ALBUTEROL 0.5-2.5 (3) MG/3ML IN SOLN
3.0000 mL | Freq: Two times a day (BID) | RESPIRATORY_TRACT | Status: DC
  Administered 2020-09-27 – 2020-09-30 (×7): 3 mL via RESPIRATORY_TRACT
  Filled 2020-09-27 (×7): qty 3

## 2020-09-27 MED ORDER — ACETAMINOPHEN 160 MG/5ML PO SOLN
650.0000 mg | ORAL | Status: DC | PRN
  Administered 2020-09-29 – 2020-10-18 (×30): 650 mg
  Filled 2020-09-27 (×33): qty 20.3

## 2020-09-27 MED ORDER — CLOPIDOGREL BISULFATE 75 MG PO TABS
75.0000 mg | ORAL_TABLET | Freq: Every day | ORAL | Status: DC
  Administered 2020-09-28 – 2020-10-01 (×4): 75 mg
  Filled 2020-09-27 (×4): qty 1

## 2020-09-27 MED ORDER — NITROGLYCERIN 0.4 MG SL SUBL: 0.4000 mg | SUBLINGUAL_TABLET | SUBLINGUAL | Status: DC | PRN

## 2020-09-27 MED ORDER — HYDROCODONE-ACETAMINOPHEN 5-325 MG PO TABS
1.0000 | ORAL_TABLET | ORAL | Status: DC | PRN
  Administered 2020-09-27 – 2020-10-01 (×11): 2
  Filled 2020-09-27 (×11): qty 2

## 2020-09-27 MED ORDER — GUAIFENESIN 100 MG/5ML PO SOLN
10.0000 mL | ORAL | Status: DC
  Administered 2020-09-27 – 2020-10-18 (×118): 200 mg
  Filled 2020-09-27 (×2): qty 10
  Filled 2020-09-27: qty 25
  Filled 2020-09-27: qty 50
  Filled 2020-09-27 (×3): qty 10
  Filled 2020-09-27: qty 5
  Filled 2020-09-27 (×5): qty 10
  Filled 2020-09-27: qty 5
  Filled 2020-09-27 (×3): qty 10
  Filled 2020-09-27: qty 5
  Filled 2020-09-27: qty 25
  Filled 2020-09-27: qty 5
  Filled 2020-09-27: qty 10
  Filled 2020-09-27: qty 5
  Filled 2020-09-27 (×3): qty 10
  Filled 2020-09-27: qty 25
  Filled 2020-09-27: qty 5
  Filled 2020-09-27: qty 25
  Filled 2020-09-27 (×3): qty 10
  Filled 2020-09-27: qty 5
  Filled 2020-09-27 (×3): qty 10
  Filled 2020-09-27: qty 25
  Filled 2020-09-27: qty 10
  Filled 2020-09-27: qty 25
  Filled 2020-09-27 (×7): qty 10
  Filled 2020-09-27: qty 50
  Filled 2020-09-27: qty 25
  Filled 2020-09-27 (×15): qty 10
  Filled 2020-09-27 (×2): qty 5
  Filled 2020-09-27: qty 10
  Filled 2020-09-27: qty 5
  Filled 2020-09-27 (×4): qty 10
  Filled 2020-09-27: qty 5
  Filled 2020-09-27: qty 10
  Filled 2020-09-27: qty 5
  Filled 2020-09-27: qty 25
  Filled 2020-09-27: qty 10
  Filled 2020-09-27: qty 5
  Filled 2020-09-27 (×2): qty 10
  Filled 2020-09-27: qty 5
  Filled 2020-09-27 (×8): qty 10
  Filled 2020-09-27: qty 5
  Filled 2020-09-27 (×2): qty 10
  Filled 2020-09-27: qty 5
  Filled 2020-09-27: qty 10
  Filled 2020-09-27: qty 50
  Filled 2020-09-27 (×20): qty 10
  Filled 2020-09-27: qty 25
  Filled 2020-09-27: qty 5
  Filled 2020-09-27: qty 10

## 2020-09-27 MED ORDER — INSULIN DETEMIR 100 UNIT/ML ~~LOC~~ SOLN
30.0000 [IU] | Freq: Two times a day (BID) | SUBCUTANEOUS | Status: DC
  Administered 2020-09-27 – 2020-09-29 (×4): 30 [IU] via SUBCUTANEOUS
  Filled 2020-09-27 (×6): qty 0.3

## 2020-09-27 MED ORDER — OSMOLITE 1.5 CAL PO LIQD
355.0000 mL | Freq: Four times a day (QID) | ORAL | Status: DC
  Administered 2020-09-27 – 2020-10-01 (×16): 355 mL
  Filled 2020-09-27 (×7): qty 474

## 2020-09-27 MED ORDER — POLYETHYLENE GLYCOL 3350 17 G PO PACK
17.0000 g | PACK | Freq: Every day | ORAL | Status: DC
  Administered 2020-09-28 – 2020-10-18 (×16): 17 g
  Filled 2020-09-27 (×17): qty 1

## 2020-09-27 MED ORDER — PROSOURCE TF PO LIQD
45.0000 mL | Freq: Two times a day (BID) | ORAL | Status: DC
  Administered 2020-09-27 – 2020-10-01 (×9): 45 mL
  Filled 2020-09-27 (×9): qty 45

## 2020-09-27 MED ORDER — FREE WATER
230.0000 mL | Freq: Four times a day (QID) | Status: DC
  Administered 2020-09-27 – 2020-10-18 (×81): 230 mL

## 2020-09-27 MED ORDER — INSULIN ASPART 100 UNIT/ML IJ SOLN
0.0000 [IU] | INTRAMUSCULAR | Status: DC
  Administered 2020-09-27: 3 [IU] via SUBCUTANEOUS
  Administered 2020-09-27: 7 [IU] via SUBCUTANEOUS
  Administered 2020-09-27: 3 [IU] via SUBCUTANEOUS
  Administered 2020-09-28 (×3): 7 [IU] via SUBCUTANEOUS
  Administered 2020-09-28: 11 [IU] via SUBCUTANEOUS
  Administered 2020-09-30 (×4): 4 [IU] via SUBCUTANEOUS
  Administered 2020-10-01: 3 [IU] via SUBCUTANEOUS
  Administered 2020-10-01: 7 [IU] via SUBCUTANEOUS
  Administered 2020-10-01: 2 [IU] via SUBCUTANEOUS
  Administered 2020-10-02 – 2020-10-03 (×2): 7 [IU] via SUBCUTANEOUS
  Administered 2020-10-04: 3 [IU] via SUBCUTANEOUS
  Administered 2020-10-04: 7 [IU] via SUBCUTANEOUS
  Administered 2020-10-04: 4 [IU] via SUBCUTANEOUS
  Administered 2020-10-05 (×2): 7 [IU] via SUBCUTANEOUS
  Administered 2020-10-05 – 2020-10-07 (×5): 4 [IU] via SUBCUTANEOUS
  Administered 2020-10-07: 7 [IU] via SUBCUTANEOUS
  Administered 2020-10-07 – 2020-10-08 (×4): 4 [IU] via SUBCUTANEOUS
  Administered 2020-10-08 – 2020-10-09 (×3): 3 [IU] via SUBCUTANEOUS
  Administered 2020-10-09: 4 [IU] via SUBCUTANEOUS
  Administered 2020-10-10: 7 [IU] via SUBCUTANEOUS
  Administered 2020-10-10: 3 [IU] via SUBCUTANEOUS
  Administered 2020-10-10: 4 [IU] via SUBCUTANEOUS
  Administered 2020-10-10: 3 [IU] via SUBCUTANEOUS
  Administered 2020-10-10: 4 [IU] via SUBCUTANEOUS
  Administered 2020-10-11 (×2): 3 [IU] via SUBCUTANEOUS
  Administered 2020-10-11: 7 [IU] via SUBCUTANEOUS
  Administered 2020-10-11: 4 [IU] via SUBCUTANEOUS
  Administered 2020-10-12 (×2): 3 [IU] via SUBCUTANEOUS
  Administered 2020-10-12: 15 [IU] via SUBCUTANEOUS
  Administered 2020-10-13: 7 [IU] via SUBCUTANEOUS
  Administered 2020-10-13: 3 [IU] via SUBCUTANEOUS
  Administered 2020-10-13 – 2020-10-14 (×2): 4 [IU] via SUBCUTANEOUS
  Administered 2020-10-15 (×3): 3 [IU] via SUBCUTANEOUS
  Administered 2020-10-15 – 2020-10-16 (×2): 4 [IU] via SUBCUTANEOUS
  Administered 2020-10-16: 3 [IU] via SUBCUTANEOUS
  Administered 2020-10-16: 4 [IU] via SUBCUTANEOUS
  Administered 2020-10-17: 3 [IU] via SUBCUTANEOUS
  Administered 2020-10-17 – 2020-10-18 (×4): 4 [IU] via SUBCUTANEOUS

## 2020-09-27 MED ORDER — IPRATROPIUM-ALBUTEROL 0.5-2.5 (3) MG/3ML IN SOLN
3.0000 mL | RESPIRATORY_TRACT | Status: DC | PRN
  Administered 2020-10-01 – 2020-10-14 (×4): 3 mL via RESPIRATORY_TRACT
  Filled 2020-09-27 (×5): qty 3

## 2020-09-27 MED ORDER — PROSOURCE TF PO LIQD: 45.0000 mL | Freq: Every day | ORAL | Status: DC

## 2020-09-27 MED ORDER — INSULIN ASPART 100 UNIT/ML IJ SOLN
3.0000 [IU] | INTRAMUSCULAR | Status: DC
  Administered 2020-09-27 – 2020-10-01 (×16): 3 [IU] via SUBCUTANEOUS

## 2020-09-27 MED ORDER — OSMOLITE 1.5 CAL PO LIQD: 1000.0000 mL | ORAL | Status: DC

## 2020-09-27 MED ORDER — ROSUVASTATIN CALCIUM 20 MG PO TABS
20.0000 mg | ORAL_TABLET | Freq: Every day | ORAL | Status: DC
  Administered 2020-09-28 – 2020-10-18 (×20): 20 mg
  Filled 2020-09-27 (×20): qty 1

## 2020-09-27 MED ORDER — BUDESONIDE 0.5 MG/2ML IN SUSP
0.5000 mg | Freq: Two times a day (BID) | RESPIRATORY_TRACT | Status: DC
  Administered 2020-09-27 – 2020-10-17 (×36): 0.5 mg via RESPIRATORY_TRACT
  Filled 2020-09-27 (×49): qty 2

## 2020-09-27 MED ORDER — FENOFIBRATE 160 MG PO TABS
160.0000 mg | ORAL_TABLET | Freq: Every day | ORAL | Status: DC
  Administered 2020-09-28 – 2020-10-18 (×20): 160 mg
  Filled 2020-09-27 (×20): qty 1

## 2020-09-27 MED ORDER — ARFORMOTEROL TARTRATE 15 MCG/2ML IN NEBU
15.0000 ug | INHALATION_SOLUTION | Freq: Two times a day (BID) | RESPIRATORY_TRACT | Status: DC
  Administered 2020-09-27 – 2020-10-17 (×39): 15 ug via RESPIRATORY_TRACT
  Filled 2020-09-27 (×32): qty 2

## 2020-09-27 NOTE — Progress Notes (Signed)
Initial Nutrition Assessment  DOCUMENTATION CODES:   Non-severe (moderate) malnutrition in context of social or environmental circumstances  INTERVENTION:   Tube feeding: - Osmolite 1.5 @ 65 ml/hr (1560 ml/day) via PEG - ProSource TF 45 ml daily   Tube feeding regimen provides 2380 kcal, 109 grams of protein, and 1189 ml of free H2O.  NUTRITION DIAGNOSIS:   Moderate Malnutrition related to social / environmental circumstances as evidenced by moderate muscle depletion, percent weight loss, moderate fat depletion.  GOAL:   Patient will meet greater than or equal to 90% of their needs  MONITOR:   Weight trends, Labs, I & O's, Diet advancement, Skin, TF tolerance  REASON FOR ASSESSMENT:   Consult Enteral/tube feeding initiation and management  ASSESSMENT:   Patient with PMH significant for HTN, HLD, DM, polysubstance use. Recently admitted to [facility] for acute ischemic/embolic stroke with L hemiparesis and severe dysphagia. Had PEG placed per IR on 8/9. Presents to CIR for further rehab therapy.  Patient unable to provide history at time of RD visit. On 8/1 patient reported having good appetite PTA. Typically consumed 3 meals daily but unable to elaborate on meal composition. Had Cortrak placed on 8/1 and TF was started. TF continued on 8/9 after PEG placement. Patient remains NPO at this time. Noted concern for tube misplacement yesterday. CT confirms PEG in correct position. Okay to restart tube feeding at goal rate per provider. May consider transition to bolus feedings in near future if tube continues to function properly.   Weight noted to decline from 89.4 kg on 5/23 to 81.3 kg this admission (9.1% wt loss in 3 months, significant for time frame).   Medications: fenofibrate, SS novolog, levemir, miralax Labs: CBG [phone]  NUTRITION - FOCUSED PHYSICAL EXAM:  Flowsheet Row Most Recent Value  Orbital Region No depletion  Upper Arm Region Moderate depletion  Thoracic and  Lumbar Region Unable to assess  Buccal Region No depletion  Temple Region Mild depletion  Clavicle Bone Region Moderate depletion  Clavicle and Acromion Bone Region Moderate depletion  Scapular Bone Region Unable to assess  Dorsal Hand Moderate depletion  Patellar Region Moderate depletion  Anterior Thigh Region Moderate depletion  Posterior Calf Region Moderate depletion  Edema (RD Assessment) Mild  Hair Reviewed  Eyes Reviewed  Mouth Reviewed  Skin Reviewed  Nails Reviewed      Diet Order:   Diet Order     None       EDUCATION NEEDS:   Not appropriate for education at this time  Skin:  Skin Assessment: Reviewed RN Assessment  Last BM:  8/10  Height:   Ht Readings from Last 1 Encounters:  09/15/20 6' (1.829 m)    Weight:   Wt Readings from Last 1 Encounters:  09/27/20 81.3 kg    BMI:  There is no height or weight on file to calculate BMI.  Estimated Nutritional Needs:   Kcal:  2200-2400 kcal  Protein:  105-115 grams  Fluid:  >/= 2 L/day  Vanessa Kick MS, RD, LDN, CNSC Clinical Nutrition Pager listed in AMION

## 2020-09-27 NOTE — Progress Notes (Signed)
INPATIENT REHABILITATION ADMISSION NOTE   Arrival Method: bed from 4N     Mental Orientation: alert and oriented x3   Assessment: see flowsheets   Skin: intact   IV'S: LUE   Pain: no pain on admission   Tubes and Drains: PEG tube   Safety Measures: fall risk    Vital Signs: see flowsheets   Height and Weight:    Rehab Orientation: done by RN with patient and patient's mother at bedside.    Family: mother, significant other     Notes:

## 2020-09-27 NOTE — H&P (Addendum)
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  Per chart review patient lives with spouse.  1 level home with level entry.  Presented 09/11/2020 with acute onset of right-sided weakness and dysarthria.  Admission chemistries unremarkable except glucose 135, alcohol negative, urine drug screen positive cocaine, hemoglobin A1c 6.4.  Cranial CT scan negative.  CTA head and neck negative for emergent large vessel occlusion.  MRI showed 2 adjacent acute infarcts within the right cingulate gyrus/colossal body(right ACA vascular territory) as well as 6 mm acute infarct within the right parietal lobe subcortical white matter and questionable punctate acute infarct within the left frontal lobe subcortical white matter.  Echocardiogram with ejection fraction 65 to 70% no wall motion abnormalities.  Currently maintained on aspirin 81 mg daily and Plavix 75 mg daily for CVA prophylaxis.  Subcutaneous Lovenox for DVT prophylaxis.  Currently n.p.o. with alternative means of nutritional support with gastrostomy tube placed 09/24/2020 per interventional radiology Dr. Deanne Coffer.Marland Kitchen  Hospital course complicated by sepsis secondary to aspiration pneumonia completed 14-day course of antibiotics and slowly weaned from oxygen.  Palliative care was consulted to establish goals of care.  Therapy evaluations completed due to patient's right side weakness and dysarthria was admitted for a comprehensive rehab program. Currently complaining of leg pain. Accidentally stretched PEG yesterday which held up admission given the amount of bleeding he had. Hgb has fallen from 14.1 8/9 to 11.4 today,  but no active signs of continued bleeding.    Review of Systems  Constitutional:  Negative for chills and fever.  HENT:  Negative for hearing loss.   Eyes:  Negative for blurred vision and double vision.  Respiratory:  Negative for cough and shortness of breath.   Cardiovascular:  Negative for chest pain, palpitations and leg swelling.  Gastrointestinal:  Positive for constipation. Negative for heartburn, nausea and vomiting.  Genitourinary:  Negative for dysuria, flank pain and hematuria.  Musculoskeletal:  Positive for myalgias.  Skin:  Negative for rash.  Neurological:  Positive for speech change and weakness.  Psychiatric/Behavioral:         Anxiety  All other systems reviewed and are negative.     Past Medical History:  Diagnosis Date   Aplastic anemia, unspecified (HCC)     Chest pain, unspecified     Chronic airway obstruction, not elsewhere classified     Cocaine substance abuse (HCC)     Neuropathy     Other and unspecified hyperlipidemia     Shortness of breath     Stroke (HCC) 06/17/2020   Tobacco use disorder     Type II or unspecified type diabetes mellitus without mention of complication, not stated as uncontrolled     Unspecified epilepsy without mention of intractable epilepsy     Unspecified essential hypertension           Past Surgical History:  Procedure Laterality Date   CARDIAC CATHETERIZATION   10/02/2010     Nonobstructive mild coronary plaque   IR GASTROSTOMY TUBE MOD SED   09/24/2020   KNEE SURGERY Bilateral 2018    Repair   PERFORATED VISCUS SURGERY  MULTIPLE FRACTURES         Family History  Problem Relation Age of Onset   Heart attack Father     COPD Father     Coronary artery disease Mother      Social History:  reports that he has been smoking cigarettes. He has a 30.00 pack-year smoking history. He has never used smokeless tobacco. He reports current alcohol use. He reports that he does not use drugs. Allergies: No Known  Allergies       Medications Prior to Admission  Medication Sig Dispense Refill   albuterol (PROVENTIL) (2.5 MG/3ML) 0.083% nebulizer solution Take 2.5 mg by nebulization every 4 (four) hours as needed for wheezing or shortness of breath.       albuterol (VENTOLIN HFA) 108 (90 Base) MCG/ACT inhaler Inhale 2 puffs into the lungs every 6 (six) hours as needed for wheezing or shortness of breath.       alclomethasone (ACLOVATE) 0.05 % cream Apply 1 application topically 2 (two) times daily.       amLODipine (NORVASC) 10 MG tablet Take 10 mg by mouth daily.       aspirin EC 81 MG tablet Take 81 mg by mouth daily. Swallow whole.       atorvastatin (LIPITOR) 80 MG tablet Take 80 mg by mouth daily.       benazepril (LOTENSIN) 10 MG tablet Take 20 mg by mouth daily.       carbamazepine (TEGRETOL XR) 400 MG 12 hr tablet Take 1 tablet (400 mg total) by mouth 2 (two) times daily. 60 tablet 6   fenofibrate 160 MG tablet Take 160 mg by mouth daily.       gabapentin (NEURONTIN) 300 MG capsule Take 3 caps in AM, 3 caps at noon, 4 caps at bedtime (Patient taking differently: Take 900-1,200 mg by mouth 3 (three) times daily. Take 3 caps in AM, 3 caps at noon, 4 caps at bedtime) 300 capsule 6   insulin glargine (LANTUS) 100 UNIT/ML injection Inject 70 Units into the skin daily as needed (depending on sugar level).       JANUMET 50-1000 MG tablet Take 1 tablet by mouth 2 (two) times daily.       Omega-3 Fatty Acids (FISH OIL) 1000 MG CAPS Take 500 mg by mouth 2 (two) times daily.       omeprazole (PRILOSEC) 40 MG capsule Take 1 capsule (40 mg total) by mouth 2 (two) times daily. 60 capsule 5   rosuvastatin (CRESTOR) 20 MG tablet Take 1 tablet (20 mg total) by mouth daily. 30 tablet 2   dextromethorphan-guaiFENesin (MUCINEX DM) 30-600 MG 12hr tablet Take 1 tablet by mouth 2 (two) times daily. (Patient not taking: Reported on 09/11/2020) 20 tablet 0   food thickener (SIMPLYTHICK, HONEY/LEVEL 3/MODERATELY THICK,) LIQD  Take 1 packet by mouth as needed. 100 packet 2      Drug Regimen Review Drug regimen was reviewed and remains appropriate with no significant issues identified   Home: Home Living Family/patient expects to be discharged to:: Private residence Living Arrangements:  (girlfriend of 30 years) Available Help at Discharge: Family, Available 24 hours/day Type of Home: House Home Access: Level entry Home Layout: One level Bathroom Shower/Tub: Engineer, manufacturing systems: Handicapped height Bathroom Accessibility: Yes Home Equipment: Government social research officer, Environmental consultant - 2 wheels, Bedside commode, Shower seat, Grab bars - toilet, Cane - quad, Wheelchair - manual, Other (comment)  Lives With: Significant other   Functional History: Prior Function Level  of Independence: Needs assistance Gait / Transfers Assistance Needed: Patient states RW use for ambulation, family assists PRN ADL's / Homemaking Assistance Needed: Assisted by family Communication / Swallowing Assistance Needed: mild slurred speech Comments: Patient states RW use for ambulation, family assists PRN   Functional Status:  Mobility: Bed Mobility Overal bed mobility: Needs Assistance Bed Mobility: Supine to Sit, Rolling Rolling: Mod assist Sidelying to sit: Mod assist Supine to sit: Mod assist, HOB elevated, +2 for physical assistance Sit to supine: Mod assist, +2 for physical assistance Sit to sidelying: Mod assist, +2 for physical assistance General bed mobility comments: Pt sitting up in chair Transfers Overall transfer level: Needs assistance Equipment used: 2 person hand held assist Transfer via Lift Equipment: Stedy Transfers: Sit to/from Stand, [employer] Transfers Sit to Stand: Mod assist, +2 physical assistance, From elevated surface Stand pivot transfers: +2 physical assistance, Max assist, +2 safety/equipment (+3 one time) General transfer comment: Pt up in chair Ambulation/Gait General Gait Details: unable at this  time.   ADL: ADL Overall ADL's : Needs assistance/impaired Eating/Feeding: Minimal assistance, Sitting Eating/Feeding Details (indicate cue type and reason): Pt required assist to scoop ice chips onto spoon, but able to move spoon to mouth.  Oral care performed prior to administration of ice chips Grooming: Moderate assistance, Sitting Grooming Details (indicate cue type and reason): Pt requires supervision and mod verbal cues to wash face (for thoroughness), supervision and mod verbal cues to sequence and attend to brushing teeth, and max A to wash bil. hands. Upper Body Bathing: Maximal assistance Lower Body Bathing: Maximal assistance Upper Body Dressing : Moderate assistance, Sitting Lower Body Dressing: Total assistance, Bed level Lower Body Dressing Details (indicate cue type and reason): To don footwear Toilet Transfer: Total assistance Toilet Transfer Details (indicate cue type and reason): Use of Stedy and Mod A +2. Toileting- Clothing Manipulation and Hygiene: Maximal assistance, Total assistance Tub/ Shower Transfer: Maximal assistance Functional mobility during ADLs: Maximal assistance General ADL Comments: Patient greatly limited by L hemiplegia, visual deficits, increased need for supplemental O2 via HHFNC, decreased static sitting balance and need for +2 assist for all bed mobility and functional transfers grossly.   Cognition: Cognition Overall Cognitive Status: Impaired/Different from baseline Arousal/Alertness: Awake/alert Orientation Level: Oriented X4 Attention: Focused Memory: Appears intact Awareness: Appears intact Executive Function: Reasoning, Organizing Cognition Arousal/Alertness: Awake/alert Behavior During Therapy: Impulsive, Restless Overall Cognitive Status: Impaired/Different from baseline Area of Impairment: Attention, Following commands, Memory, Safety/judgement, Awareness, Problem solving Current Attention Level: Sustained (with moderate  cues) Memory: Decreased short-term memory, Decreased recall of precautions Following Commands: Follows one step commands inconsistently, Follows one step commands with increased time Safety/Judgement: Decreased awareness of safety, Decreased awareness of deficits Awareness: Intellectual Problem Solving: Slow processing, Decreased initiation, Difficulty sequencing, Requires tactile cues General Comments: Pt with very poor attention, and is very easily distracted.  He requires moderate cues to sustain attention to familiar grooming tasks.  due to attentional deficits, he requires max cues for sequencing and problem solving   Physical Exam: Blood pressure 135/85, pulse (!) 105, temperature 97.7 F (36.5 C), temperature source Oral, resp. rate 18, height 6' (1.829 m), weight 83 kg, SpO2 92 %. Physical Exam Gen: no distress, NCAT HEENT: oral mucosa pink and moist, NCAT Cardio: Tachycardic Chest: normal effort, normal rate of breathing Abdominal:     Comments: Gastrostomy tube in place and dressed, minimal blood around tube. Wearing abdominal binder Ext: no clubbing, cyanosis or edema Skin: other than PEG site appears grossly  intact Neurological:     Comments: sedated, opens eyes to verbal stimuli. Follows basic commands only (just received pain medication earlier)  Makes eye contact with examiner.  Speech is dysarthric.  Provides name and follow simple commands. LLE is 3/5 with decreased sensation throughout. LUE tremor present with activity. Poor safety awareness, impulsive.    Lab Results Last 48 Hours        Results for orders placed or performed during the hospital encounter of 09/11/20 (from the past 48 hour(s))  Glucose, capillary     Status: Abnormal    Collection Time: 09/24/20  3:19 PM  Result Value Ref Range    Glucose-Capillary 106 (H) 70 - 99 mg/dL      Comment: Glucose reference range applies only to samples taken after fasting for at least 8 hours.  Glucose, capillary      Status: Abnormal    Collection Time: 09/24/20  7:28 PM  Result Value Ref Range    Glucose-Capillary 115 (H) 70 - 99 mg/dL      Comment: Glucose reference range applies only to samples taken after fasting for at least 8 hours.  Glucose, capillary     Status: Abnormal    Collection Time: 09/24/20 11:34 PM  Result Value Ref Range    Glucose-Capillary 154 (H) 70 - 99 mg/dL      Comment: Glucose reference range applies only to samples taken after fasting for at least 8 hours.  Glucose, capillary     Status: Abnormal    Collection Time: 09/25/20  3:14 AM  Result Value Ref Range    Glucose-Capillary 255 (H) 70 - 99 mg/dL      Comment: Glucose reference range applies only to samples taken after fasting for at least 8 hours.  Glucose, capillary     Status: Abnormal    Collection Time: 09/25/20  4:06 AM  Result Value Ref Range    Glucose-Capillary 178 (H) 70 - 99 mg/dL      Comment: Glucose reference range applies only to samples taken after fasting for at least 8 hours.  CBC with Differential/Platelet     Status: Abnormal    Collection Time: 09/25/20  6:38 AM  Result Value Ref Range    WBC 13.5 (H) 4.0 - 10.5 K/uL    RBC 4.15 (L) 4.22 - 5.81 MIL/uL    Hemoglobin 13.3 13.0 - 17.0 g/dL    HCT 12.4 58.0 - 99.8 %    MCV 96.4 80.0 - 100.0 fL    MCH 32.0 26.0 - 34.0 pg    MCHC 33.3 30.0 - 36.0 g/dL    RDW 33.8 25.0 - 53.9 %    Platelets 268 150 - 400 K/uL    nRBC 0.0 0.0 - 0.2 %    Neutrophils Relative % 72 %    Neutro Abs 9.9 (H) 1.7 - 7.7 K/uL    Lymphocytes Relative 16 %    Lymphs Abs 2.1 0.7 - 4.0 K/uL    Monocytes Relative 7 %    Monocytes Absolute 0.9 0.1 - 1.0 K/uL    Eosinophils Relative 3 %    Eosinophils Absolute 0.4 0.0 - 0.5 K/uL    Basophils Relative 1 %    Basophils Absolute 0.1 0.0 - 0.1 K/uL    Immature Granulocytes 1 %    Abs Immature Granulocytes 0.07 0.00 - 0.07 K/uL      Comment: Performed at [facility], [address] [place], [place] [address]  Glucose,  capillary     Status: Abnormal    Collection Time: 09/25/20  7:53 AM  Result Value Ref Range    Glucose-Capillary 211 (H) 70 - 99 mg/dL      Comment: Glucose reference range applies only to samples taken after fasting for at least 8 hours.  Glucose, capillary     Status: Abnormal    Collection Time: 09/25/20 11:54 AM  Result Value Ref Range    Glucose-Capillary 116 (H) 70 - 99 mg/dL      Comment: Glucose reference range applies only to samples taken after fasting for at least 8 hours.  Glucose, capillary     Status: Abnormal    Collection Time: 09/25/20  4:15 PM  Result Value Ref Range    Glucose-Capillary 238 (H) 70 - 99 mg/dL      Comment: Glucose reference range applies only to samples taken after fasting for at least 8 hours.  Glucose, capillary     Status: Abnormal    Collection Time: 09/25/20  7:24 PM  Result Value Ref Range    Glucose-Capillary 247 (H) 70 - 99 mg/dL      Comment: Glucose reference range applies only to samples taken after fasting for at least 8 hours.  Glucose, capillary     Status: Abnormal    Collection Time: 09/25/20 11:26 PM  Result Value Ref Range    Glucose-Capillary 212 (H) 70 - 99 mg/dL      Comment: Glucose reference range applies only to samples taken after fasting for at least 8 hours.  Glucose, capillary     Status: Abnormal    Collection Time: 09/26/20  4:00 AM  Result Value Ref Range    Glucose-Capillary 260 (H) 70 - 99 mg/dL      Comment: Glucose reference range applies only to samples taken after fasting for at least 8 hours.  Glucose, capillary     Status: Abnormal    Collection Time: 09/26/20  7:56 AM  Result Value Ref Range    Glucose-Capillary 173 (H) 70 - 99 mg/dL      Comment: Glucose reference range applies only to samples taken after fasting for at least 8 hours.      Imaging Results (Last 48 hours)  No results found.           Medical Problem List and Plan: 1.   Right side weakness and dysarthria secondary to 2 adjacent  acute infarctions in the right cingulate gyrus/colossal body(right ACA vascular territory) as well as infarct right parietal lobe and left frontal subcortical white matter infarct. Hx of prior stroke 6/22.             -patient may shower             -ELOS/Goals: Min A 2 weeks             -Admit to CIR 2.  Impaired mobility: -DVT/anticoagulation:  Pharmaceutical: Lovenox             -antiplatelet therapy: Continue Aspirin 81 mg daily and Plavix 75 mg dailyx 3 weeks 3. Pain Management: d/c IV Dilaudid. Hydrocodone as needed 4. Anxiety: Continue Xanax as needed              -antipsychotic agents: N/A 5. Neuropsych: This patient is capable of making decisions on his own behalf. 6. Skin/Wound Care: Routine skin checks 7. Fluids/Electrolytes/Nutrition: Routine in and outs with follow-up chemistries 8.  Dysphagia.  Status postgastrostomy tube 09/24/2020 per interventional radiology. -tube traumatically stretched  yesterday with associated bleeding in/around tube. -hgb stable -tube is functioning appropriately -SLP follow up 9.Cociane/tobacco abuse.UDS positive cocaine. 10.  Diabetes mellitus.  Hemoglobin A1c 6.4.  Follow-up 3 units every 4 hours and Levemir 30 units twice daily.  Diabetic teaching 11.  Hyperlipidemia.  Crestor/fenofibrate 12.Aspiration pneumonia.  Completed 14 days antibiotic 13.  Constipation.  MiraLAX 14. Mild anemia d/t #8. Hgb 11.4 today  -check CBC in AM  -PEG site with minimal bleeding.        Mcarthur RossettiDaniel J Angiulli, PA-C 09/26/2020   I have personally performed a face to face diagnostic evaluation of this patient and formulated the key components of the plan.  Additionally, I have personally reviewed laboratory data, imaging studies, as well as relevant notes and concur with the physician assistant's documentation above.  The patient's status has not changed from the original H&P.  Any changes in documentation from the acute care chart have been noted above.  Ranelle OysterZachary T.  Annaleia Pence, MD, [place] DomFAAPMR

## 2020-09-27 NOTE — Discharge Instructions (Addendum)
Inpatient Rehab Discharge Instructions  Brent [name] Discharge date and time: No discharge date for patient encounter.   Activities/Precautions/ Functional Status: Activity: activity as tolerated Diet: diabetic diet Wound Care: routine skin checks Functional status:  ___ No restrictions     ___ Walk up steps independently ___ 24/7 supervision/assistance   ___ Walk up steps with assistance ___ Intermittent supervision/assistance  ___ Bathe/dress independently ___ Walk with walker     __x_ Bathe/dress with assistance ___ Walk Independently    ___ Shower independently ___ Walk with assistance    ___ Shower with assistance ___ No alcohol     ___ Return to work/school ________  Special Instructions: No driving smoking or alcohol   My questions have been answered and I understand these instructions. I will adhere to these goals and the provided educational materials after my discharge from the hospital.  Patient/Caregiver Signature _______________________________ Date __________  Clinician Signature _______________________________________ Date __________  Please bring this form and your medication list with you to all your follow-up doctor's appointments.  STROKE/TIA DISCHARGE INSTRUCTIONS SMOKING Cigarette smoking nearly doubles your risk of having a stroke & is the single most alterable risk factor  If you smoke or have smoked in the last 12 months, you are advised to quit smoking for your health. Most of the excess cardiovascular risk related to smoking disappears within a year of stopping. Ask you doctor about anti-smoking medications [place] Quit Line: 1-800-QUIT NOW Free Smoking Cessation Classes [phone]  CHOLESTEROL Know your levels; limit fat & cholesterol in your diet  Lipid Panel     Component Value Date/Time   CHOL 139 09/12/2020 0410   TRIG 249 (H) 09/12/2020 0410   HDL 33 (L) 09/12/2020 0410   CHOLHDL 4.2 09/12/2020 0410   VLDL 50 (H) 09/12/2020 0410   LDLCALC  56 09/12/2020 0410     Many patients benefit from treatment even if their cholesterol is at goal. Goal: Total Cholesterol (CHOL) less than 160 Goal:  Triglycerides (TRIG) less than 150 Goal:  HDL greater than 40 Goal:  LDL (LDLCALC) less than 100   BLOOD PRESSURE American Stroke Association blood pressure target is less that 120/80 mm/Hg  Your discharge blood pressure is:    Monitor your blood pressure Limit your salt and alcohol intake Many individuals will require more than one medication for high blood pressure  DIABETES (A1c is a blood sugar average for last 3 months) Goal HGBA1c is under 7% (HBGA1c is blood sugar average for last 3 months)  Diabetes:   Lab Results  Component Value Date   HGBA1C 6.4 (H) 09/12/2020    Your HGBA1c can be lowered with medications, healthy diet, and exercise. Check your blood sugar as directed by your physician Call your physician if you experience unexplained or low blood sugars.  PHYSICAL ACTIVITY/REHABILITATION Goal is 30 minutes at least 4 days per week  Activity: Increase activity slowly, Therapies: Physical Therapy: Home Health Return to work:  Activity decreases your risk of heart attack and stroke and makes your heart stronger.  It helps control your weight and blood pressure; helps you relax and can improve your mood. Participate in a regular exercise program. Talk with your doctor about the best form of exercise for you (dancing, walking, swimming, cycling).  DIET/WEIGHT Goal is to maintain a healthy weight  Your discharge diet is:  Diet Order     None       liquids Your height is:    Your current weight is:  Your Body Mass Index (BMI) is:    Following the type of diet specifically designed for you will help prevent another stroke. Your goal weight range is:   Your goal Body Mass Index (BMI) is 19-24. Healthy food habits can help reduce 3 risk factors for stroke:  High cholesterol, hypertension, and excess weight.  RESOURCES  Stroke/Support Group:  Call [phone]   STROKE EDUCATION PROVIDED/REVIEWED AND GIVEN TO PATIENT Stroke warning signs and symptoms How to activate emergency medical system (call 911). Medications prescribed at discharge. Need for follow-up after discharge. Personal risk factors for stroke. Pneumonia vaccine given: No Flu vaccine given: No My questions have been answered, the writing is legible, and I understand these instructions.  I will adhere to these goals & educational materials that have been provided to me after my discharge from the hospital.

## 2020-09-27 NOTE — Progress Notes (Signed)
Report given to rehab RN. Pt transferred to 4W21.

## 2020-09-27 NOTE — Progress Notes (Signed)
Meredith Staggers, MD   Physician  Physical Medicine and Rehabilitation  PMR Pre-admission      Addendum  Date of Service:  09/26/2020 11:12 AM       Related encounter: ED to Hosp-Admission (Discharged) from 09/11/2020 in [place] all [x]Written[x]Templated[]Copied  Added by: [x], Vertis Kelch, RN[x]Raulkar, Clide Deutscher, MD[x]Swartz, Celesta Gentile, MD  []Hover for details                                                                                                                                                                                                                                                                                                                                                                                                                                                             PMR Admission Coordinator Pre-Admission Assessment   Patient: Brent [name] is an 55 y.o., male MRN: [id number] DOB: 1965-10-27 Height: 6' (182.9 cm)Weight: 83 kg   Insurance Information HMO:     PPO:      PCP:      IPA:      80/20:      OTHER:  PRIMARY: [employer] [place] medicaid      Policy#: [id number]      Subscriber: pt  CM Name: Marijo Sanes      Phone#: [phone]     Fax#: [phone] Pre-Cert#: [id number] approved until 10/03/20      Employer:  Benefits:  Phone #: [phone]     Name: 8/11 Eff. Date: 08/2019     Deduct: none      Out of Pocket Max: none CIR: per medicaid guidelines         Financial Counselor:       Phone#:    The "Data Collection Information Summary" for patients in Inpatient Rehabilitation Facilities with attached "Privacy Act [place] Records" was provided and verbally reviewed with: N/A   Emergency Contact Information Contact  Information       Name Relation Home Work Mobile    [place] Significant other [place]    [facility] Mother [phone]      Yi,Tiffany Daughter     [phone]           Current Medical History  Patient Admitting Diagnosis:CVA   History of Present Illness: 55 year old male with medical history of HTN, HLD, type 2 DM, cocaine abuse, tobacco abuse  and recent CVA with a 2 to 3 day history of slurred speech, left hemiparesis and SOB. Presented to [facility] on 09/11/2020 with cough and worsening of SOB per significant other. Found to have sepsis secondary to aspiration PNA, COPD exacerbation, acute hypoxic respiratory failure and acute CVA. Transferred to [facility] for further respiratory management.    He was treated with empiric IV antibiotics, steroids and bronchodilators. He required humidified high flow oxygenation, and NG tube placement for enteral nutrition because of severe dysphagia. He has completed 14 days of antibiotics and is NPO with tube feeds. Now on 2 liters [place]. Continue aspirin for acute CVA and resuming Plavix. Status post PEG placement by OR on 8/9. Tolerating tube feeds now. Plan outpatient pulmonary follow up for PFTS. Continue Levemir and SS Novolog. Lovenox for DVT prophylaxis.   Complete NIHSS TOTAL: 9   Patient's medical record from [facility]  has been reviewed by the rehabilitation admission coordinator and physician.   Past Medical History      Past Medical History:  Diagnosis Date   Aplastic anemia, unspecified (HCC)     Chest pain, unspecified     Chronic airway obstruction, not elsewhere classified     Cocaine substance abuse ([place])     Neuropathy     Other and unspecified hyperlipidemia     Shortness of breath     Stroke ([place]) 06/17/2020   Tobacco use disorder     Type II or unspecified type diabetes mellitus without mention of complication, not stated as uncontrolled     Unspecified epilepsy  without mention of intractable epilepsy     Unspecified essential hypertension        Family History   family history includes COPD in his father; Coronary artery disease in his mother; Heart attack in his father.   Prior Rehab/Hospitalizations Has the patient had prior rehab or hospitalizations prior to admission? Yes   Has the patient had major surgery during 100 days prior to admission? Yes              Current Medications   Current Facility-Administered Medications:    0.9 %  sodium chloride infusion, , Intravenous, Continuous, McQuaid, [name] B, MD, Stopped at 09/19/20 0355   0.9 %  sodium chloride infusion, , Intravenous, PRN, Juanito Doom, MD, Last Rate: 10 mL/hr  at 09/24/20 1800, Infusion Verify at 09/24/20 1800   acetaminophen (TYLENOL) 160 MG/5ML solution 650 mg, 650 mg, Per Tube, Q4H PRN, Simonne Maffucci B, MD, 650 mg at 09/24/20 2024   ALPRAZolam (XANAX) tablet 0.25 mg, 0.25 mg, Oral, TID PRN, Nicole Kindred A, DO, 0.25 mg at 09/26/20 1218   arformoterol (BROVANA) nebulizer solution 15 mcg, 15 mcg, Nebulization, BID, Simonne Maffucci B, MD, 15 mcg at 09/26/20 9323   aspirin chewable tablet 81 mg, 81 mg, Per Tube, Daily, McQuaid, Nathaneil Canary B, MD, 81 mg at 09/26/20 0806   budesonide (PULMICORT) nebulizer solution 0.5 mg, 0.5 mg, Nebulization, BID, McQuaid, [name] B, MD, 0.5 mg at 09/26/20 5573   carBAMazepine (TEGRETOL) 100 MG/5ML suspension 200 mg, 200 mg, Per Tube, Q6H, McQuaid, [name] B, MD, 200 mg at 09/26/20 1220   chlorhexidine (PERIDEX) 0.12 % solution 15 mL, 15 mL, Mouth Rinse, BID, McQuaid, [name] B, MD, 15 mL at 09/26/20 0950   Chlorhexidine Gluconate Cloth 2 % PADS 6 each, 6 each, Topical, Q2200, Simonne Maffucci B, MD, 6 each at 09/25/20 2212   clopidogrel (PLAVIX) tablet 75 mg, 75 mg, Per Tube, Daily, Arne Cleveland, MD, 75 mg at 09/26/20 0806   enoxaparin (LOVENOX) injection 40 mg, 40 mg, Subcutaneous, Q24H, Jennye Boroughs, MD, 40 mg at 09/25/20 2210    feeding supplement (OSMOLITE 1.5 CAL) liquid 1,000 mL, 1,000 mL, Per Tube, Continuous, Nicole Kindred A, DO, Last Rate: 55 mL/hr at 09/25/20 1608, Rate Change at 09/25/20 1608   feeding supplement (PROSource TF) liquid 45 mL, 45 mL, Per Tube, Daily, McQuaid, [name] B, MD, 45 mL at 09/26/20 0806   fenofibrate tablet 160 mg, 160 mg, Per Tube, Daily, Simonne Maffucci B, MD, 160 mg at 09/26/20 0806   guaiFENesin (ROBITUSSIN) 100 MG/5ML solution 200 mg, 10 mL, Per Tube, Q4H, McQuaid, [name] B, MD, 200 mg at 09/26/20 1217   haloperidol lactate (HALDOL) injection 5 mg, 5 mg, Intramuscular, Q6H PRN, Jennye Boroughs, MD   HYDROcodone-acetaminophen (NORCO/VICODIN) 5-325 MG per tablet 1-2 tablet, 1-2 tablet, Oral, Q4H PRN, Arne Cleveland, MD   HYDROmorphone (DILAUDID) injection 1 mg, 1 mg, Intravenous, Q2H PRN, Arne Cleveland, MD, 1 mg at 09/25/20 2331   insulin aspart (novoLOG) injection 0-20 Units, 0-20 Units, Subcutaneous, Q4H, McQuaid, [name] B, MD, 7 Units at 09/26/20 1231   insulin aspart (novoLOG) injection 3 Units, 3 Units, Subcutaneous, Q4H, McQuaid, [name] B, MD, 3 Units at 09/26/20 1231   insulin detemir (LEVEMIR) injection 30 Units, 30 Units, Subcutaneous, BID, Jennye Boroughs, MD, 30 Units at 09/26/20 0801   ipratropium-albuterol (DUONEB) 0.5-2.5 (3) MG/3ML nebulizer solution 3 mL, 3 mL, Nebulization, Q4H PRN, McQuaid, [name] B, MD, 3 mL at 09/23/20 0232   ipratropium-albuterol (DUONEB) 0.5-2.5 (3) MG/3ML nebulizer solution 3 mL, 3 mL, Nebulization, BID, Nicole Kindred A, DO, 3 mL at 09/26/20 0834   liver oil-zinc oxide (DESITIN) 40 % ointment, , Topical, Daily PRN, Jennye Boroughs, MD, Given at 09/23/20 2015   MEDLINE mouth rinse, 15 mL, Mouth Rinse, q12n4p, McQuaid, [name] B, MD, 15 mL at 09/26/20 1220   melatonin tablet 3 mg, 3 mg, Per Tube, QHS PRN, Simonne Maffucci B, MD, 3 mg at 09/25/20 2216   nitroGLYCERIN (NITROSTAT) SL tablet 0.4 mg, 0.4 mg, Sublingual, Q5 min PRN, Simonne Maffucci  B, MD   ondansetron (ZOFRAN) injection 4 mg, 4 mg, Intravenous, Q4H PRN, Arne Cleveland, MD   polyethylene glycol (MIRALAX / GLYCOLAX) packet 17 g, 17 g, Per Tube, Daily, Juanito Doom, MD,  17 g at 09/25/20 1224   rosuvastatin (CRESTOR) tablet 20 mg, 20 mg, Per Tube, Daily, McQuaid, [name] B, MD, 20 mg at 09/26/20 0806   Patients Current Diet:  Diet Order                  Diet NPO time specified Except for: Ice Chips  Diet effective now                         Precautions / Restrictions Precautions Precautions: Fall Precaution Comments: supplemental O2 via [place], L hemi Restrictions Weight Bearing Restrictions: No    Has the patient had 2 or more falls or a fall with injury in the past year? No   Prior Activity Level Limited Community (1-2x/wk): supervision with RW, supervision to min with adls   Prior Functional Level Self Care: Did the patient need help bathing, dressing, using the toilet or eating? Needed some help   Indoor Mobility: Did the patient need assistance with walking from room to room (with or without device)? Needed some help   Stairs: Did the patient need assistance with internal or external stairs (with or without device)? Needed some help   Functional Cognition: Did the patient need help planning regular tasks such as shopping or remembering to take medications? Needed some help   Home Assistive Devices / [place] Devices/Equipment: Kasandra Knudsen (specify quad or straight), Bedside commode/3-in-1, Wheelchair Home Equipment: Toilet riser, Environmental consultant - 2 wheels, Bedside commode, Shower seat, Grab bars - toilet, Cane - quad, Wheelchair - manual, Other (comment)   Prior Device Use: Indicate devices/aids used by the patient prior to current illness, exacerbation or injury? Walker   Current Functional Level Cognition   Arousal/Alertness: Awake/alert Overall Cognitive Status: Impaired/Different from baseline Current Attention Level: Sustained (with  moderate cues) Orientation Level: Oriented X4 Following Commands: Follows one step commands inconsistently, Follows one step commands with increased time Safety/Judgement: Decreased awareness of safety, Decreased awareness of deficits General Comments: Pt with very poor attention, and is very easily distracted.  He requires moderate cues to sustain attention to familiar grooming tasks.  due to attentional deficits, he requires max cues for sequencing and problem solving Attention: Focused Memory: Appears intact Awareness: Appears intact Executive Function: Reasoning, Organizing    Extremity Assessment (includes Sensation/Coordination)   Upper Extremity Assessment: LUE deficits/detail RUE Deficits / Details: AROM WFL; MMT 3+/5 at shoulder and triceps. 4-/5 at biceps and grossly grasp. RUE Coordination: WNL LUE Deficits / Details: Pt demonstrates tremor Lt UE when he attempts to move.  He requires max cues to attempt activity/reaching with Lt UE as he frequently replies with "I can't", and demonstrates difficulty sustaining attention to attempt use of it.  He does demonstrate active extension of digits, and minimal active flexion as well as minimal shoulder flexion with max facilliation and encouragement. LUE Sensation: decreased light touch (Endorses numbness/tingling) LUE Coordination: decreased fine motor  Lower Extremity Assessment: Defer to PT evaluation LLE Deficits / Details: grossly -3/5 LLE Sensation: decreased light touch LLE Coordination: decreased fine motor, decreased gross motor     ADLs   Overall ADL's : Needs assistance/impaired Eating/Feeding: Minimal assistance, Sitting Eating/Feeding Details (indicate cue type and reason): Pt required assist to scoop ice chips onto spoon, but able to move spoon to mouth.  Oral care performed prior to administration of ice chips Grooming: Moderate assistance, Sitting Grooming Details (indicate cue type and reason): Pt requires supervision  and mod verbal cues to  wash face (for thoroughness), supervision and mod verbal cues to sequence and attend to brushing teeth, and max A to wash bil. hands. Upper Body Bathing: Maximal assistance Lower Body Bathing: Maximal assistance Upper Body Dressing : Moderate assistance, Sitting Lower Body Dressing: Total assistance, Bed level Lower Body Dressing Details (indicate cue type and reason): To don footwear Toilet Transfer: Total assistance Toilet Transfer Details (indicate cue type and reason): Use of Stedy and Mod A +2. Toileting- Clothing Manipulation and Hygiene: Maximal assistance, Total assistance Tub/ Shower Transfer: Maximal assistance Functional mobility during ADLs: Maximal assistance General ADL Comments: Patient greatly limited by L hemiplegia, visual deficits, increased need for supplemental O2 via [facility], decreased static sitting balance and need for +2 assist for all bed mobility and functional transfers grossly.     Mobility   Overal bed mobility: Needs Assistance Bed Mobility: Supine to Sit, Rolling Rolling: Mod assist Sidelying to sit: Mod assist Supine to sit: Mod assist, HOB elevated, +2 for physical assistance Sit to supine: Mod assist, +2 for physical assistance Sit to sidelying: Mod assist, +2 for physical assistance General bed mobility comments: Pt sitting up in chair     Transfers   Overall transfer level: Needs assistance Equipment used: 2 person hand held assist Transfer via Lift Equipment: Stedy Transfers: Sit to/from Stand, [employer] Transfers Sit to Stand: Mod assist, +2 physical assistance, From elevated surface Stand pivot transfers: +2 physical assistance, Max assist, +2 safety/equipment (+3 one time) General transfer comment: Pt up in chair     Ambulation / Gait / Stairs / Wheelchair Mobility   Ambulation/Gait General Gait Details: unable at this time.     Posture / Balance Dynamic Sitting Balance Sitting balance - Comments: leans/pulls to the  right his strong side, cues to find midline, and cannot maintain sitting without external support and certainly not without his own R hand held assist. Balance Overall balance assessment: Needs assistance Sitting-balance support: Feet supported, No upper extremity supported Sitting balance-Leahy Scale: Poor Sitting balance - Comments: leans/pulls to the right his strong side, cues to find midline, and cannot maintain sitting without external support and certainly not without his own R hand held assist. Postural control: Left lateral lean Standing balance support: Bilateral upper extremity supported, During functional activity Standing balance-Leahy Scale: Poor Standing balance comment: +2 heavy mod assist in standing.     Special needs/care consideration New PEG placed by IR on 8/9 Very impulsive and lacks safety awareness    Previous Home Environment  Living Arrangements:  (girlfriend of 30 years)  Lives With: Significant other Available Help at Discharge: Family, Available 24 hours/day Type of Home: House Home Layout: One level Home Access: Level entry Bathroom Shower/Tub: Chiropodist: Handicapped height Bathroom Accessibility: Yes [place]: Yes Type of Home Care Services: Homehealth aide   Discharge Living Setting Plans for Discharge Living Setting: Patient's home, Lives with (comment) (girlfriend of 30 years) Discharge Home Layout: One level Discharge Home Access: Level entry Discharge Bathroom Shower/Tub: Tub/shower unit Discharge Bathroom Toilet: Handicapped height Discharge Bathroom Accessibility: Yes How Accessible: Accessible via walker Does the patient have any problems obtaining your medications?: No   Social/Family/Support Systems Patient Roles: Partner Contact Information: Hanley Hays and daughter, Jonelle Sidle, 46 years  old Anticipated Caregiver: [name] Anticipated Ambulance person Information: see above Ability/Limitations of  Caregiver: [name] is a CNA by Pensions consultant Availability: 24/7 Discharge Plan Discussed with Primary Caregiver: Yes Is Caregiver In Agreement with Plan?: Yes Does Caregiver/Family have Issues with  Lodging/Transportation while Pt is in Rehab?: No   Goals Patient/Family Goal for Rehab: min assist with PT, OT and SLP Expected length of stay: ELOS 2 to 3 weeks Pt/Family Agrees to Admission and willing to participate: Yes Program Orientation Provided & Reviewed with Pt/Caregiver Including Roles  & Responsibilities: Yes Information Needs to be Provided By: Health Care directives   Decrease burden of Care through IP rehab admission: na   Possible need for SNF placement upon discharge: not anticipated   Patient Condition: I have reviewed medical records from [facility] , spoken with CM, and patient, spouse, and daughter. I met with patient at the bedside for inpatient rehabilitation assessment.  Patient will benefit from ongoing PT, OT, and SLP, can actively participate in 3 hours of therapy a day 5 days of the week, and can make measurable gains during the admission.  Patient will also benefit from the coordinated team approach during an Inpatient Acute Rehabilitation admission.  The patient will receive intensive therapy as well as Rehabilitation physician, nursing, social worker, and care management interventions.  Due to bladder management, bowel management, safety, skin/wound care, disease management, medication administration, pain management, and patient education the patient requires 24 hour a day rehabilitation nursing.  The patient is currently mod assist overall with mobility and basic ADLs.  Discharge setting and therapy post discharge at home with home health is anticipated.  Patient has agreed to participate in the Acute Inpatient Rehabilitation Program and will admit today.   Preadmission Screen Completed By:  Cleatrice Burke, 09/26/2020 1:33  PM ______________________________________________________________________   Discussed status with Dr. Ranell Patrick on 09/26/2020 at 1334 and received approval for admission today.   Admission Coordinator:  Cleatrice Burke, RN, time 1638 Date  09/26/2020    Assessment/Plan: Diagnosis: R ACA CVA Does the need for close, 24 hr/day Medical supervision in concert with the patient's rehab needs make it unreasonable for this patient to be served in a less intensive setting? Yes Co-Morbidities requiring supervision/potential complications: hypertriglyceridemia, tobacco abuse, COPD with acute exacerbation, cocaine abuse, CVA Due to bladder management, bowel management, safety, skin/wound care, disease management, medication administration, pain management, and patient education, does the patient require 24 hr/day rehab nursing? Yes Does the patient require coordinated care of a physician, rehab nurse, PT, OT, and SLP to address physical and functional deficits in the context of the above medical diagnosis(es)? Yes Addressing deficits in the following areas: balance, endurance, locomotion, strength, transferring, bowel/bladder control, bathing, dressing, feeding, grooming, toileting, cognition, and psychosocial support Can the patient actively participate in an intensive therapy program of at least 3 hrs of therapy 5 days a week? Yes The potential for patient to make measurable gains while on inpatient rehab is good Anticipated functional outcomes upon discharge from inpatient rehab: min assist PT, min assist OT, min assist SLP Estimated rehab length of stay to reach the above functional goals is: 2 weeks Anticipated discharge destination: Home 10. Overall Rehab/Functional Prognosis: excellent     MD Signature: Leeroy Cha, MD   Addendum 09/27/20: PEG was accidentally was pulled on by patient leading to some bleeding around and in PEG. Labs are stable this morning. Pt tolerating TF's. Will admit  to inpatient rehab today.    Meredith Staggers, MD, [place] [facility] 09/27/2020      Revision History  Note Details  Author Meredith Staggers, MD File Time 09/27/2020 10:11 AM  Author Type Physician Status Addendum  Last Editor Meredith Staggers, MD Service Physical Medicine and [place] # [id number] Admit Date 09/27/2020

## 2020-09-27 NOTE — Progress Notes (Signed)
Spoke to provider on call regarding abdominal ct result and restarting using PEG, provider stated that PEG tube is not be used at that time.

## 2020-09-27 NOTE — Progress Notes (Signed)
Inpatient Rehabilitation Medication Review by a Pharmacist  A complete drug regimen review was completed for this patient to identify any potential clinically significant medication issues.  Clinically significant medication issues were identified:  no  Check AMION for pharmacist assigned to patient if future medication questions/issues arise during this admission.  Pharmacist comments:   Time spent performing this drug regimen review (minutes):  10 minutes   [place] Novie Maggio 09/27/2020 5:45 PM

## 2020-09-27 NOTE — Progress Notes (Signed)
Brief Nutrition Follow-Up Note  Received page from RN. Pt is currently on continuous feedings, but is extremely agitated; RN reports concern about pt pulling on lines and tubes and would like to transition to bolus feedings. Pt seen earlier today; refer to RD note for further details.   Intervention:  Initiate bolus feedings:   355 ml (1.5 cans) Osmolite 1.5 via PEG 4 times daily  45 ml Prostat BID.    115 free water flush before and after each feeding administration  Tube feeding regimen provides 2210 kcal (100% of needs), 111 grams of protein, and 1086 ml of H2O.  Total free water: 2006 ml daily.  RD will continue to follow and adjust TF regimen as needed.   Levada Schilling, RD, LDN, CDCES Registered Dietitian II Certified Diabetes Care and Education Specialist Please refer to [facility] for RD and/or RD on-call/weekend/after hours pager

## 2020-09-27 NOTE — Progress Notes (Signed)
Inpatient Rehabilitation Admissions Coordinator   Discussed with Dr Denton Lank. Patient is medically ready to d/c to CIR today. I have alerted acute team and TOC. We will make the arrangements  to admit today.  Ottie Glazier, RN, MSN Rehab Admissions Coordinator [phone] 09/27/2020 10:27 AM

## 2020-09-28 DIAGNOSIS — R7309 Other abnormal glucose: Secondary | ICD-10-CM

## 2020-09-28 DIAGNOSIS — D72829 Elevated white blood cell count, unspecified: Secondary | ICD-10-CM

## 2020-09-28 DIAGNOSIS — I639 Cerebral infarction, unspecified: Secondary | ICD-10-CM

## 2020-09-28 DIAGNOSIS — N179 Acute kidney failure, unspecified: Secondary | ICD-10-CM

## 2020-09-28 LAB — GLUCOSE, CAPILLARY
Glucose-Capillary: 103 mg/dL — ABNORMAL HIGH (ref 70–99)
Glucose-Capillary: 207 mg/dL — ABNORMAL HIGH (ref 70–99)
Glucose-Capillary: 233 mg/dL — ABNORMAL HIGH (ref 70–99)
Glucose-Capillary: 244 mg/dL — ABNORMAL HIGH (ref 70–99)
Glucose-Capillary: 270 mg/dL — ABNORMAL HIGH (ref 70–99)
Glucose-Capillary: 74 mg/dL (ref 70–99)

## 2020-09-28 MED ORDER — SODIUM CHLORIDE 0.9 % IV SOLN: INTRAVENOUS | Status: DC

## 2020-09-28 NOTE — Progress Notes (Addendum)
[facility] PHYSICAL MEDICINE & REHABILITATION PROGRESS NOTE  Subjective/Complaints: Patient seen laying in bed this morning working with therapies.  He indicates he slept well overnight.  He is perseverative on ice chips.  Discussed motor function and strength with therapies.  Also discussed impulsivity with nursing.  ROS: Limited due to cognition.  Objective: Vital Signs: Blood pressure (!) 144/72, pulse (!) 108, temperature 98.4 F (36.9 C), resp. rate 18, height 6' (1.829 m), weight 81.3 kg, SpO2 97 %. CT ABDOMEN PELVIS W CONTRAST  Result Date: 09/26/2020 CLINICAL DATA:  GI bleeding, peg tube malfunction, blood returning from 2 after patient knocked over IV pole while feeds were connected. EXAM: CT ABDOMEN AND PELVIS WITH CONTRAST TECHNIQUE: Multidetector CT imaging of the abdomen and pelvis was performed using the standard protocol following bolus administration of intravenous contrast. CONTRAST:  39mL OMNIPAQUE IOHEXOL 350 MG/ML SOLN COMPARISON:  Peg tube placement procedural images 8922, abdominal radiograph 09/16/2020, CT 09/12/2020 FINDINGS: Lower chest: Patchy and reticular opacities are seen in the left lung base some of which may post infectious or inflammatory scarring and architectural distortion and more coalescent density laterally could reflect residual airspace disease. Additional mild bandlike scarring or atelectasis the right lung base lingula. Normal heart size. No pericardial effusion. Coronary artery atherosclerosis. Hepatobiliary: No concerning focal liver lesion. Smooth surface contour. Normal hepatic attenuation. Gallbladder containing multiple dependently layering gallstones. No pericholecystic fluid or inflammation or biliary ductal dilatation nor visible intraductal gallstone. Pancreas: No pancreatic ductal dilatation or surrounding inflammatory changes. Spleen: Normal in size. No concerning splenic lesions. Small accessory splenule towards the hilum. Adrenals/Urinary Tract:  Normal adrenals. Mild bilateral perinephric stranding is a chronic finding, similar to comparison priors. No visible or contour deforming renal lesion. Kidneys enhance and excrete symmetrically. Mild bladder wall thickening, nonspecific given the degree of distention. Stomach/Bowel: Distal esophagus is unremarkable. Percutaneous gastrostomy tube is noted with retention balloon appearing appropriately intraluminal. Small amount of stranding along the PEG tube placement track is nonspecific given recent placement procedure. No organized collection. No extraluminal gas or free fluid. No extravasation of enteric contrast media. Distal stomach and duodenum are unremarkable. No small bowel thickening or dilatation. Normal appendix in the right lower quadrant. Moderate colonic stool burden. Scattered colonic diverticula without focal inflammation to suggest diverticulitis. No colonic dilatation or wall thickening. Vascular/Lymphatic: Atherosclerotic calcifications within the abdominal aorta and branch vessels. No aneurysm or ectasia. No enlarged abdominopelvic lymph nodes. Reproductive: The prostate and seminal vesicles are unremarkable. Other: Postsurgical changes from percutaneous gastrostomy tube placement. Small amount of stranding along the percutaneous tract. No organized abscess, collection or extravasation of fluid or contrast. No abdominopelvic free air or fluid. Postsurgical changes of the atria abdominal wall in multiple locations, correlate with prior procedural history. Fat containing inguinal hernias. No bowel containing hernia. Musculoskeletal: No acute osseous abnormality or suspicious osseous lesion. Chronic abdominal wall laxity is similar to prior. Remaining musculature is normal and symmetric. IMPRESSION: Percutaneous gastrostomy tube appears appropriately positioned with the retention balloon remaining intraluminal at this time. Small amount of stranding along the percutaneous gastrostomy tube  placement tract is nonspecific and likely normal postprocedural change given that the patient is only postop day 2 from the procedure. No extraluminal gas, free fluid, organized collection or abscess is seen. Mild bladder wall thickening, nonspecific given underdistention. Could correlate for urinary symptoms and with urinalysis where appropriate. Chronic abdominal wall laxity and extensive postsurgical changes the anterior abdominal wall. Correlate with prior surgical history. Patchy reticular opacities in the left  lung base, some of which may be related to post infectious or inflammatory scarring given consolidation in this location on prior CT. Some more coalescent density laterally could reflect residual airspace disease or possible round atelectasis. Electronically Signed   By: Kreg Shropshire M.D.   On: 09/26/2020 21:08   Recent Labs    09/26/20 1833 09/27/20 0540  WBC  --  22.9*  HGB 12.4* 11.4*  HCT 36.8* 34.5*  PLT  --  276   Recent Labs    09/27/20 0540  NA 145  K 5.0  CL 112*  CO2 25  GLUCOSE 168*  BUN 71*  CREATININE 1.40*  CALCIUM 9.9    Intake/Output Summary (Last 24 hours) at 09/28/2020 1448 Last data filed at 09/28/2020 1300 Gross per 24 hour  Intake 0 ml  Output --  Net 0 ml        Physical Exam: BP (!) 144/72 (BP Location: Left Arm)   Pulse (!) 108   Temp 98.4 F (36.9 C)   Resp 18   Ht 6' (1.829 m)   Wt 81.3 kg   SpO2 97%   BMI 24.31 kg/m  Constitutional: No distress . Vital signs reviewed. HENT: Normocephalic.  Atraumatic. Eyes: EOMI. No discharge. Cardiovascular: No JVD.  Tachycardia. Respiratory: Normal effort.  No stridor.  Bilateral clear to auscultation. GI: Non-distended.  BS +.  + PEG. Skin: Warm and dry.  Intact. Psych: Perseverative.  Impulsive. Musc: No edema in extremities.  No tenderness in extremities. Neuro: Alert Dysarthria LUE: Shoulder abduction, elbow flexion/extension 1/5, handgrip 3/5 LLE: 1/5 proximal  distal  Assessment/Plan: 1. Functional deficits which require 3+ hours per day of interdisciplinary therapy in a comprehensive inpatient rehab setting. Physiatrist is providing close team supervision and 24 hour management of active medical problems listed below. Physiatrist and rehab team continue to assess barriers to discharge/monitor patient progress toward functional and medical goals   Care Tool:  Bathing              Bathing assist       Upper Body Dressing/Undressing Upper body dressing   What is the patient wearing?: Hospital gown only    Upper body assist Assist Level: Moderate Assistance - Patient 50 - 74%    Lower Body Dressing/Undressing Lower body dressing            Lower body assist       Toileting Toileting    Toileting assist Assist for toileting: Total Assistance - Patient < 25%     Transfers Chair/bed transfer  Transfers assist     Chair/bed transfer assist level: Maximal Assistance - Patient 25 - 49% (+2)     Locomotion Ambulation   Ambulation assist              Walk 10 feet activity   Assist           Walk 50 feet activity   Assist           Walk 150 feet activity   Assist           Walk 10 feet on uneven surface  activity   Assist           Wheelchair     Assist               Wheelchair 50 feet with 2 turns activity    Assist            Wheelchair 150 feet activity  Assist           Medical Problem List and Plan: 1.   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 Hx of prior stroke 6/22.  Begin CIR evaluations 2.  Impaired mobility: -DVT/anticoagulation:  Pharmaceutical: Lovenox             -antiplatelet therapy: Continue Aspirin 81 mg daily and Plavix 75 mg  dailyx 3 weeks 3. Pain Management: d/c IV Dilaudid. Hydrocodone as needed  Appears controlled on 8/13 4. Anxiety: Continue Xanax as needed              -antipsychotic agents: N/A 5. Neuropsych: This patient is not capable of making decisions on his 1.40 on 8/12 behalf.  Telemetry sitter for safety  Wrist restraints for safety 6. Skin/Wound Care: Routine skin checks 7. 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 -SLP follow up, advance diet as tolerated 9. Cocaine/tobacco abuse.UDS positive cocaine.  Counseled on appropriate 10.  Diabetes mellitus.  Hemoglobin A1c 6.4.  Follow-up 3 units every 4 hours and Levemir 30 units twice daily.  Diabetic teaching  Labile on 8/13, monitor trend 11.  Hyperlipidemia.  Crestor/fenofibrate 12.Aspiration pneumonia.  Completed 14 days antibiotic 13.  Constipation.  MiraLAX 14. Mild anemia   11.4 on 8/12, continue to monitor 15.  Leukocytosis  WBCs 22.9 on 8/12, continue to monitor 16.  AKI  Creatinine 1.40 on 8/12  IVF initiated  LOS: 1 days A FACE TO FACE EVALUATION WAS PERFORMED  Andelyn Spade Karis Juba 09/28/2020, 2:48 PM

## 2020-09-28 NOTE — Evaluation (Signed)
Physical Therapy Assessment and Plan  Patient Details  Name: Brent [name] MRN: [id number] Date of Birth: 09/06/1965  PT Diagnosis: Abnormal posture, Abnormality of gait, Cognitive deficits, Hemiplegia non-dominant, Impaired cognition, Muscle weakness, and Pain in back Rehab Potential: Fair ELOS: ~ 4 weeks   Today's Date: 09/28/2020 PT Individual Time: 1107-1202 PT Individual Time Calculation (min): 15 min    Hospital Problem: Principal Problem:   CVA (cerebral vascular accident) [facility])   Past Medical History:  Past Medical History:  Diagnosis Date   Aplastic anemia, unspecified ([place])    Chest pain, unspecified    Chronic airway obstruction, not elsewhere classified    Cocaine substance abuse ([place])    Neuropathy    Other and unspecified hyperlipidemia    Shortness of breath    Stroke ([place]) 06/17/2020   Tobacco use disorder    Type II or unspecified type diabetes mellitus without mention of complication, not stated as uncontrolled    Unspecified epilepsy without mention of intractable epilepsy    Unspecified essential hypertension    Past Surgical History:  Past Surgical History:  Procedure Laterality Date   CARDIAC CATHETERIZATION  10/02/2010    Nonobstructive mild coronary plaque   IR GASTROSTOMY TUBE MOD SED  09/24/2020   KNEE SURGERY Bilateral 2018   Repair   PERFORATED VISCUS SURGERY     MULTIPLE FRACTURES    Assessment & Plan Clinical Impression: Patient is a 55 y.o.  right-handed male with history of hypertension hyperlipidemia diabetes mellitus type 2 cocaine use as well as tobacco as well as recent CVA posterior limb right internal capsule and left basal ganglia 07/25/2020 maintained on aspirin,.  Per chart review patient lives with spouse.  1 level home with level entry.  Presented 09/11/2020 with acute onset of right-sided weakness and dysarthria.  Admission chemistries unremarkable except glucose 135, alcohol negative, urine drug screen positive cocaine,  hemoglobin A1c 6.4.  Cranial CT scan negative.  CTA head and neck negative for emergent large vessel occlusion.  MRI showed 2 adjacent acute infarcts within the right cingulate gyrus/colossal body(right ACA vascular territory) as well as 6 mm acute infarct within the right parietal lobe subcortical white matter and questionable punctate acute infarct within the left frontal lobe subcortical white matter.  Echocardiogram with ejection fraction 65 to 70% no wall motion abnormalities.  Currently maintained on aspirin 81 mg daily and Plavix 75 mg daily for CVA prophylaxis.  Subcutaneous Lovenox for DVT prophylaxis.  Currently n.p.o. with alternative means of nutritional support with gastrostomy tube placed 09/24/2020 per interventional radiology Dr. Vernard Gambles.Marland Kitchen  Hospital course complicated by sepsis secondary to aspiration pneumonia completed 14-day course of antibiotics and slowly weaned from oxygen.  Palliative care was consulted to establish goals of care.  Therapy evaluations completed due to patient's right side weakness and dysarthria was admitted for a comprehensive rehab program. Currently complaining of leg pain. Accidentally stretched PEG yesterday which held up admission given the amount of bleeding he had. Hgb has fallen from 14.1 8/9 to 11.4 today, but no active signs of continued bleeding. Patient transferred to CIR on 09/27/2020 .   Patient currently requires  +2 total assist  with mobility secondary to muscle weakness, muscle joint tightness, and muscle paralysis, decreased cardiorespiratoy endurance and decreased oxygen support, impaired timing and sequencing, abnormal tone, and unbalanced muscle activation, decreased attention to left, decreased attention, decreased awareness, decreased problem solving, decreased safety awareness, decreased memory, and delayed processing, and decreased sitting balance, decreased standing balance, decreased postural control,  hemiplegia, and decreased balance strategies.   Prior to hospitalization, patient was modified independent  with mobility and lived with Significant other Brent [name]) in a House home.  Home access is 1Stairs to enter.  Patient will benefit from skilled PT intervention to maximize safe functional mobility, minimize fall risk, and decrease caregiver burden for planned discharge home with 24 hour assist.  Anticipate patient will benefit from follow up [facility] at discharge.  PT - End of Session Activity Tolerance: Tolerates 30+ min activity with multiple rests Endurance Deficit: Yes PT Assessment Rehab Potential (ACUTE/IP ONLY): Fair PT Barriers to Discharge: Home environment access/layout;Nutrition means;Behavior PT Patient demonstrates impairments in the following area(s): Balance;Safety;Behavior;Sensory;Skin Integrity;Edema;Endurance;Motor;Nutrition;Pain;Perception PT Transfers Functional Problem(s): Bed Mobility;Bed to Chair;Car;Furniture PT Locomotion Functional Problem(s): Ambulation;Wheelchair Mobility;Stairs PT Plan PT Intensity: Minimum of 1-2 x/day ,45 to 90 minutes PT Frequency: 5 out of 7 days PT Duration Estimated Length of Stay: ~ 4 weeks PT Treatment/Interventions: Community reintegration;Ambulation/gait training;DME/adaptive equipment instruction;Neuromuscular re-education;Psychosocial support;Stair training;UE/LE Strength taining/ROM;Wheelchair propulsion/positioning;Balance/vestibular training;Discharge planning;Functional electrical stimulation;Pain management;Skin care/wound management;Therapeutic Activities;UE/LE Coordination activities;Cognitive remediation/compensation;Disease management/prevention;Functional mobility training;Patient/family education;Splinting/orthotics;Therapeutic Exercise;Visual/perceptual remediation/compensation PT Transfers Anticipated Outcome(s): min assist using LRAD PT Locomotion Anticipated Outcome(s): mod assist using LRA PT Recommendation Follow Up Recommendations: Home health PT;24 hour  supervision/assistance Patient destination: Home Equipment Recommended: To be determined   PT Evaluation Precautions/Restrictions Precautions Precautions: Fall;Other (comment) Precaution Comments: supplemental O2 via [place], L hemi, impaired safety awareness, impulsive Restrictions Weight Bearing Restrictions: No Pain Pain Assessment Pain Scale: 0-10 Pain Score: 9  Faces Pain Scale: No hurt Pain Type: Chronic pain Pain Location: Back Pain Orientation: Lower Pain Descriptors / Indicators: Aching;Discomfort Pain Onset: On-going Pain Intervention(s): Medication (See eMAR);Rest;Relaxation;Repositioned Home Living/Prior Functioning Home Living Available Help at Discharge: Family;Available 24 hours/day Type of Home: House Home Access: Stairs to enter [employer] of Steps: 1 Home Layout: One level  Lives With: Significant other Brent [name]) Prior Function Driving: No 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 Comments: Patient states RW use for ambulation, family assists PRN Perception  Perception Perception: Impaired Inattention/Neglect: Other (comment) (L inattention) Spatial Orientation: continuously asking for therapist to "pull me up" despite HOB maximally elevated Praxis Praxis: Impaired Praxis Impairment Details: Perseveration  Cognition Overall Cognitive Status: Impaired/Different from baseline Arousal/Alertness: Awake/alert Orientation Level: Oriented X4 (with repeated questioning able to recall the year) Attention: Focused;Sustained Focused Attention: Impaired Sustained Attention: Impaired Awareness: Impaired Problem Solving: Impaired Behaviors: Impulsive;Restless;Perseveration Safety/Judgment: Impaired Sensation Sensation Light Touch: Impaired Detail (inconsistent responses with some decreased sensation on R medial thigh and randomly throughout L LE - difficult to determine if inconsistent responses due to cognitive impairments or sensory  impairments) Light Touch Impaired Details: Impaired RLE;Impaired LLE Hot/Cold: Not tested Proprioception: Impaired by gross assessment Stereognosis: Not tested Coordination Gross Motor Movements are Fluid and Coordinated: No Coordination and Movement Description: significant weakness in B LEs with L hemiparesis and impaired trunk control Heel Shin Test: unable to perform with L LE due to paresis and impaired coordination with R LE like likely due to paresis Motor  Motor Motor: Hemiplegia;Abnormal postural alignment and control;Abnormal tone Motor - Skilled Clinical Observations: L hemiparesis but with B LE weakness, impaired trunk control, hypotonia in L LE   Trunk/Postural Assessment  Cervical Assessment Cervical Assessment: Exceptions to [facility] (forward head) Thoracic Assessment Thoracic Assessment: Exceptions to [facility] (thoracic rounding) Lumbar Assessment Lumbar Assessment: Exceptions to [facility] (posterior pelvic tilt) Postural Control Postural Control: Deficits on evaluation Righting Reactions: delayed and insufficient in sitting with repeated R posterior trunk LOB  Balance Balance  Balance Assessed: Yes Static Sitting Balance Static Sitting - Balance Support: Feet supported Static Sitting - Level of Assistance: 3: Mod assist Dynamic Sitting Balance Dynamic Sitting - Balance Support: During functional activity Dynamic Sitting - Level of Assistance: 2: Max assist;1: +1 Total assist Extremity Assessment      RLE Assessment RLE Assessment: Exceptions to [facility] Passive Range of Motion (PROM) Comments: tightness noted moving into ankle DF RLE Strength Right Hip Flexion: 3-/5 Right Hip Extension: 3/5 Right Hip ABduction: 3/5 Right Hip ADduction: 3-/5 Right Knee Flexion: 3/5 Right Knee Extension: 3/5 Right Ankle Dorsiflexion: 3/5 Right Ankle Plantar Flexion: 3/5 LLE Assessment Passive Range of Motion (PROM) Comments: tightness noted moving into ankle DF LLE Strength Left Hip  Flexion: 0/5 Left Hip Extension: 1/5 Left Hip ABduction: 0/5 Left Hip ADduction: 0/5 Left Knee Flexion: 0/5 Left Knee Extension: 0/5 Left Ankle Dorsiflexion: 0/5 Left Ankle Plantar Flexion: 0/5  Care Tool Care Tool Bed Mobility Roll left and right activity   Roll left and right assist level: Maximal Assistance - Patient 25 - 49%    Sit to lying activity   Sit to lying assist level: Maximal Assistance - Patient 25 - 49%    Lying to sitting edge of bed activity   Lying to sitting edge of bed assist level: Maximal Assistance - Patient 25 - 49%     Care Tool Transfers Sit to stand transfer Sit to stand activity did not occur: Safety/medical concerns      Chair/bed transfer Chair/bed transfer activity did not occur: Safety/medical concerns       Psychologist, counselling transfer activity did not occur: Safety/medical concerns        Care Tool Locomotion Ambulation Ambulation activity did not occur: Safety/medical concerns        Walk 10 feet activity Walk 10 feet activity did not occur: Safety/medical concerns       Walk 50 feet with 2 turns activity Walk 50 feet with 2 turns activity did not occur: Safety/medical concerns      Walk 150 feet activity Walk 150 feet activity did not occur: Safety/medical concerns      Walk 10 feet on uneven surfaces activity Walk 10 feet on uneven surfaces activity did not occur: Safety/medical concerns      Stairs Stair activity did not occur: Safety/medical concerns        Walk up/down 1 step activity Walk up/down 1 step or curb (drop down) activity did not occur: Safety/medical concerns     Walk up/down 4 steps activity did not occuR: Safety/medical concerns  Walk up/down 4 steps activity      Walk up/down 12 steps activity Walk up/down 12 steps activity did not occur: Safety/medical concerns      Pick up small objects from floor Pick up small object from the floor (from standing position) activity did not  occur: Safety/medical concerns      Wheelchair Will patient use wheelchair at discharge?: Yes   Wheelchair activity did not occur: Safety/medical concerns      Wheel 50 feet with 2 turns activity      Wheel 150 feet activity        Refer to Care Plan for Long Term Goals  SHORT TERM GOAL WEEK 1 PT Short Term Goal 1 (Week 1): Pt will tolerate upright, OOB sitting for at least 1 hour between therapy sessions. PT Short Term Goal 2 (Week 1): Pt will perform sit<>stands with +  2 total assist PT Short Term Goal 3 (Week 1): Pt will complete bed<>chair transfers with +2 total assist PT Short Term Goal 4 (Week 1): Pt will demonstrate improved sitting balance with ability to maintain static sitting for at least 2 minutes without UE support  Recommendations for other services: None   Skilled Therapeutic Intervention Pt received supine in bed stating "help me sit up." Therapist introduced self and educated pt on physical therapy evaluation as follows and pt agreeable to session. Evaluation completed (see details above) with patient education regarding purpose of PT evaluation, PT POC and goals, therapy schedule, weekly team meetings, and other CIR information including safety plan and fall risk safety. Pt perseverating on consuming ice chips during session. Therapist assisted with oral care using suction for swallow safety and provided pt with 4 ice chips (1 at a time) - pt noted to have significant amount of coughing with each ice chip Eliezer Lofts, SLP notified. Performed mobility tasks with the specified levels of assistance below. Pt noted to have repeated R posterior trunk LOB while sitting EOB when decreasing R UE support and performing reaching task slightly outside BOS or that causes slight posterior lean. Unable to safely attempt standing with only 1 person assist. R lateral scoot on EOB with heavy max assist and very small scoots without hip clearance and allowing increased time for initiation.  Therapist provided pt with TIS wheelchair to allow upright, OOB activity in future therapy sessions when +2 assist is available to complete safe transfer. At end of session pt left supine in bed with needs in reach, [facility] elevated, bed alarm on, R soft wrist restraint donned, and telesitter in place.   Mobility Bed Mobility Bed Mobility: Sit to Supine;Supine to Sit Supine to Sit: Maximal Assistance - Patient - Patient 25-49% Sit to Supine: Maximal Assistance - Patient 25-49% Transfers Transfers: Lateral/Scoot Transfers Lateral/Scoot Transfers: Maximal Assistance - Patient 25-49% (lateral scoot along EOB with heavy max assist and no hip clearance) Locomotion  Gait Ambulation: No Gait Gait: No Stairs / Additional Locomotion Stairs: No Wheelchair Mobility Wheelchair Mobility: No   Discharge Criteria: Patient will be discharged from PT if patient refuses treatment 3 consecutive times without medical reason, if treatment goals not met, if there is a change in medical status, if patient makes no progress towards goals or if patient is discharged from hospital.  The above assessment, treatment plan, treatment alternatives and goals were discussed and mutually agreed upon: by patient  Tawana Scale , PT, DPT, NCS, CSRS 09/28/2020, 7:58 AM

## 2020-09-28 NOTE — Evaluation (Signed)
Occupational Therapy Assessment and Plan  Patient Details  Name: Brent [name] MRN: [id number] Date of Birth: 1965-09-09  OT Diagnosis: abnormal posture, altered mental status, cognitive deficits, disturbance of vision, hemiplegia affecting non-dominant side, and muscle weakness (generalized) Rehab Potential: Rehab Potential (ACUTE ONLY): Good ELOS: 24-28 days   Today's Date: 09/28/2020 OT Individual Time: 0800-0902 OT Individual Time Calculation (min): 62 min     Hospital Problem: Principal Problem:   CVA (cerebral vascular accident) ([place]) Active Problems:   Labile blood glucose   Leukocytosis   AKI (acute kidney injury) ([place])   Past Medical History:  Past Medical History:  Diagnosis Date   Aplastic anemia, unspecified (HCC)    Chest pain, unspecified    Chronic airway obstruction, not elsewhere classified    Cocaine substance abuse ([place])    Neuropathy    Other and unspecified hyperlipidemia    Shortness of breath    Stroke ([place]) 06/17/2020   Tobacco use disorder    Type II or unspecified type diabetes mellitus without mention of complication, not stated as uncontrolled    Unspecified epilepsy without mention of intractable epilepsy    Unspecified essential hypertension    Past Surgical History:  Past Surgical History:  Procedure Laterality Date   CARDIAC CATHETERIZATION  10/02/2010    Nonobstructive mild coronary plaque   IR GASTROSTOMY TUBE MOD SED  09/24/2020   KNEE SURGERY Bilateral 2018   Repair   PERFORATED VISCUS SURGERY     MULTIPLE FRACTURES    Assessment & Plan Clinical Impression: Patient is a 55 y.o. year old male with recent admission to the hospital on 09/11/2020 with acute onset of right-sided weakness and dysarthria.  Admission chemistries unremarkable except glucose 135, alcohol negative, urine drug screen positive cocaine, hemoglobin A1c 6.4.  Cranial CT scan negative.  CTA head and neck negative for emergent large vessel occlusion.  MRI showed 2  adjacent acute infarcts within the right cingulate gyrus/colossal body(right ACA vascular territory) as well as 6 mm acute infarct within the right parietal lobe subcortical white matter and questionable punctate acute infarct within the left frontal lobe subcortical white matter.  Patient transferred to CIR on 09/27/2020 .    Patient currently requires total with basic self-care skills secondary to muscle weakness, muscle joint tightness, and muscle paralysis, impaired timing and sequencing, abnormal tone, unbalanced muscle activation, decreased coordination, and decreased motor planning, decreased midline orientation, decreased attention to left, and left side neglect, decreased attention, decreased awareness, decreased problem solving, decreased safety awareness, decreased memory, and delayed processing, and decreased sitting balance, decreased standing balance, decreased postural control, hemiplegia, and decreased balance strategies.  Prior to hospitalization, patient could complete ADLs with modified independent .  Patient will benefit from skilled intervention to decrease level of assist with basic self-care skills and increase independence with basic self-care skills prior to discharge home with care partner.  Anticipate patient will require moderate physical assestance and follow up home health.  OT - End of Session Activity Tolerance: Decreased this session Endurance Deficit: Yes OT Assessment Rehab Potential (ACUTE ONLY): Good OT Barriers to Discharge: Decreased caregiver support OT Barriers to Discharge Comments: Will need 24 hr assist and pt reports that spouse works. OT Patient demonstrates impairments in the following area(s): Balance;Behavior;Cognition;Endurance;Motor;Vision;Sensory;Safety;Perception OT Basic ADL's Functional Problem(s): Eating;Grooming;Bathing;Dressing;Toileting OT Transfers Functional Problem(s): Toilet;Tub/Shower OT Additional Impairment(s): Fuctional Use of Upper  Extremity OT Plan OT Intensity: Minimum of 1-2 x/day, 45 to 90 minutes OT Frequency: 5 out of 7 days OT Duration/Estimated  Length of Stay: 24-28 days OT Treatment/Interventions: Balance/vestibular training;Discharge planning;Functional electrical stimulation;Pain management;Self Care/advanced ADL retraining;Therapeutic Activities;UE/LE Coordination activities;Visual/perceptual remediation/compensation;Therapeutic Exercise;Patient/family education;Functional mobility training;Disease mangement/prevention;Cognitive remediation/compensation;DME/adaptive equipment instruction;Neuromuscular re-education;Psychosocial support;Splinting/orthotics;UE/LE Strength taining/ROM;Wheelchair propulsion/positioning OT Self Feeding Anticipated Outcome(s): supervision OT Basic Self-Care Anticipated Outcome(s): mod assist OT Toileting Anticipated Outcome(s): mod assist OT Bathroom Transfers Anticipated Outcome(s): min assist OT Recommendation Recommendations for Other Services: Neuropsych consult Patient destination: Home Follow Up Recommendations: 24 hour supervision/assistance;Home health OT Equipment Recommended: To be determined   OT Evaluation Precautions/Restrictions  Precautions Precautions: Fall;Other (comment) Precaution Comments: supplemental O2 via [place], L hemi, impaired safety awareness, impulsive Restrictions Weight Bearing Restrictions: No  Pain Pain Assessment Pain Scale: 0-10 Pain Score: 0-No pain Home Living/Prior Functioning Home Living Available Help at Discharge: Family, Available 24 hours/day Type of Home: House Home Access: Stairs to enter Technical brewer of Steps: 1 Home Layout: One level Bathroom Shower/Tub: Chiropodist: Handicapped height Bathroom Accessibility: Yes  Lives With: Significant other IADL History Homemaking Responsibilities: Yes Occupation: Retired Prior Function Level of Independence: Requires assistive device for  independence Driving: No 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 Comments: Patient states RW use for ambulation, family assists PRN Vision Baseline Vision/History: No visual deficits Patient Visual Report: No change from baseline Vision Assessment?: Yes Eye Alignment: Within Functional Limits Ocular Range of Motion: Within Functional Limits Tracking/Visual Pursuits: Able to track stimulus in all quads without difficulty Convergence: Within functional limits Perception  Perception: Impaired Inattention/Neglect: Does not attend to left side of body Praxis Praxis: Impaired Praxis Impairment Details: Perseveration Cognition Overall Cognitive Status: Impaired/Different from baseline Arousal/Alertness: Awake/alert Orientation Level: Person;Place;Situation Person: Oriented Place: Oriented Situation: Oriented Year: 2022 Month: August Day of Week: Incorrect ("August") Memory: Impaired Memory Impairment: Storage deficit;Decreased recall of new information Immediate Memory Recall: Sock;Blue;Bed Memory Recall Sock: Not able to recall Memory Recall Blue: With Cue Memory Recall Bed: Not able to recall Attention: Focused;Sustained Focused Attention: Impaired Focused Attention Impairment: Functional basic Sustained Attention: Impaired Sustained Attention Impairment: Functional basic Awareness: Impaired Awareness Impairment: Intellectual impairment;Emergent impairment;Anticipatory impairment Problem Solving: Impaired Problem Solving Impairment: Functional basic;Verbal basic Executive Function: Reasoning;Organizing Reasoning: Impaired Reasoning Impairment: Verbal basic;Functional basic Organizing: Impaired Organizing Impairment: Verbal basic;Functional basic Behaviors: Impulsive;Restless;Perseveration Safety/Judgment: Impaired Comments: Pt with decreased focused attention throughout selfcare tasks, perseverating on getting him ice chips. Sensation Sensation Light  Touch: Appears Intact Hot/Cold: Not tested Proprioception: Impaired by gross assessment Stereognosis: Not tested Additional Comments: Light touch intact in LUE but proprioception difficulty to accurately assess secondary to pt hving difficulty following therapist's instructions. Coordination Gross Motor Movements are Fluid and Coordinated: No Fine Motor Movements are Fluid and Coordinated: No Coordination and Movement Description: Brunnstrum stage II in the arm and stage IV in the hand.  Needs max hand over hand to try and integrate into functional tasks. Motor  Motor Motor: Hemiplegia;Abnormal postural alignment and control;Abnormal tone Motor - Skilled Clinical Observations: LUE and LLE hemiparesis with slight increased tone noted in the LUE as well  Trunk/Postural Assessment  Cervical Assessment Cervical Assessment: Exceptions to [facility] (forward head) Thoracic Assessment Thoracic Assessment: Exceptions to [facility] (thoracic rounding) Lumbar Assessment Lumbar Assessment: Exceptions to [facility] (posterior pelvic tilt) Postural Control Postural Control: Deficits on evaluation Righting Reactions: delayed  Balance Balance Balance Assessed: Yes Static Sitting Balance Static Sitting - Balance Support: Feet supported Static Sitting - Level of Assistance: 3: Mod assist Dynamic Sitting Balance Dynamic Sitting - Balance Support: During functional activity Dynamic Sitting - Level of Assistance: 2: Max assist Extremity/Trunk  Assessment RUE Assessment RUE Assessment: Within Functional Limits General Strength Comments: AROM WFLs for selfcare tasks with strength at least 3+/5 but not formally assessed. LUE Assessment LUE Assessment: Exceptions to [facility] Passive Range of Motion (PROM) Comments: WFLs for all joints Active Range of Motion (AROM) Comments: Brunnstrum stage II in the arm and stage IV in the hand.  Synergy pattern developing in the arm with pt exhibiting 80% gross digit flexion and 60% extension  to command LUE Body System: Neuro LUE Tone LUE Tone: Mild;Other (Comment) (increased flexor tone in the elbow)  Care Tool Care Tool Self Care Eating Eating activity did not occur: Safety/medical concerns      Oral Care    Oral Care Assist Level: Minimal Assistance - Patient > 75%    Bathing   Body parts bathed by patient: Chest;Left arm;Right upper leg;Left upper leg;Face;Right lower leg;Abdomen;Front perineal area Body parts bathed by helper: Right arm;Buttocks;Left lower leg   Assist Level: 2 Helpers    Upper Body Dressing(including orthotics)   What is the patient wearing?: Pull over shirt   Assist Level: Dependent - Patient 0%    Lower Body Dressing (excluding footwear)   What is the patient wearing?: Pants;Incontinence brief Assist for lower body dressing: 2 Helpers    Putting on/Taking off footwear   What is the patient wearing?: Non-skid slipper socks;Ted hose Assist for footwear: Dependent - Patient 0%       Care Tool Toileting Toileting activity   Assist for toileting: Dependent - Patient 0%     Care Tool Bed Mobility Roll left and right activity   Roll left and right assist level: Maximal Assistance - Patient 25 - 49%    Sit to lying activity   Sit to lying assist level: Maximal Assistance - Patient 25 - 49%    Lying to sitting edge of bed activity   Lying to sitting edge of bed assist level: Maximal Assistance - Patient 25 - 49%     Care Tool Transfers Sit to stand transfer   Sit to stand assist level: 2 Helpers    Chair/bed transfer   Chair/bed transfer assist level: 2 Pension scheme manager transfer   Assist Level: 2 Helpers     Care Tool Cognition Expression of Ideas and Wants Expression of Ideas and Wants: Some difficulty - exhibits some difficulty with expressing needs and ideas (e.g, some words or finishing thoughts) or speech is not clear   Understanding Verbal and Non-Verbal Content Understanding Verbal and Non-Verbal Content: Usually  understands - understands most conversations, but misses some part/intent of message. Requires cues at times to understand   Memory/Recall Ability *first 3 days only      Refer to Care Plan for Long Term Goals  SHORT TERM GOAL WEEK 1 OT Short Term Goal 1 (Week 1): Pt will maintain sustained attention to basic selfcare/grooming tasks with no more than mod instructional cueing for re-direction. OT Short Term Goal 2 (Week 1): Pt will maintain static sitting balance EOB with supervision for 4 mins in preparation for selfcare tasks. OT Short Term Goal 3 (Week 1): Pt will complete UB bathing with min assist for two consecutive sessions. OT Short Term Goal 4 (Week 1): Pt will complete sit to stand with total +2 (pt 30%) during LB bathing or dressing.  Recommendations for other services: Neuropsych   Skilled Therapeutic Intervention ADL ADL Eating: NPO Grooming: Minimal assistance Where Assessed-Grooming: Bed level Upper Body Bathing: Moderate assistance Where Assessed-Upper Body  Bathing: Edge of bed Lower Body Bathing: Dependent Where Assessed-Lower Body Bathing: Bed level;Edge of bed Upper Body Dressing: Dependent Where Assessed-Upper Body Dressing: Bed level Lower Body Dressing: Dependent Where Assessed-Lower Body Dressing: Bed level Toileting: Dependent Where Assessed-Toileting: Bed level Toilet Transfer: Dependent Toilet Transfer Method: Squat pivot Toilet Transfer Equipment: Drop arm bedside commode Tub/Shower Transfer: Not assessed Social research officer, government: Not assessed Mobility  Bed Mobility Bed Mobility: Sit to Supine;Supine to Sit Supine to Sit: Maximal Assistance - Patient - Patient 25-49% Sit to Supine: Maximal Assistance - Patient 25-49% Transfers Stand to Sit: 2 Helpers  Session Note:  Pt in bed to start session perseverating on wanting assist to sit up, reaching out for therapist's hand.  Pt re-directed that we would sit up soon to work on selfcare tasks but  therapist needed to get things setup.  He continued to perseverate on this the next several mins as well as requesting ice chips repeatedly throughout session.  Max assist for supine to sit with pt requesting water.  Instructed pt that he could not have water, but after completion of B/D tasks, he could have ice chips with completion of oral hygiene.  Pt impulsively trying to place washcloth in his mouth for water when presented for bathing.  Therapist moved water to the side and placed soap on the washcloth for pt to wash his face.  He again tried placing the washcloth in his mouth with re-direction that it had soap on it and that he could not put it in his mouth.  Mod assist for sitting balance throughout task with max instructional cueing to initiate and complete bathing in sitting.  Decreased focused attention throughout secondary to wanting ice chips, continually needing re-direction that he could have them later.  Unable to complete standing with one therapist, so returned to supine to complete washing buttocks and front peri area.  He was able to wash the front when presented with washcloth in supine with HOB elevated.  He needed total assist for washing buttocks as well as donning new brief and paper scrub pants with HOB flat.  Total assist for donning scrub top as well supine with HOB elevated.  Finished session with pt completing oral hygiene at min assist level.  Nursing made aware after completion that pt was requesting ice chips.  Pt was left in the bed with the call button and phone in reach and safety alarm in place as well as telesitter.    Discharge Criteria: Patient will be discharged from OT if patient refuses treatment 3 consecutive times without medical reason, if treatment goals not met, if there is a change in medical status, if patient makes no progress towards goals or if patient is discharged from hospital.  The above assessment, treatment plan, treatment alternatives and goals were  discussed and mutually agreed upon: by patient  , OTR/L 09/28/2020, 5:25 PM

## 2020-09-28 NOTE — Progress Notes (Signed)
Patient has called out multiple times during shift tonight. Patient needs have been assessed. Vital signs are stable. When nursing staff asks patient what his needs are patient states "Help me" and pushes the call button with staff standing in the room. Nurse assisted patient with set up to brush his teeth and then nurse administered 4-5 ice chips per instructions. PRN Vicodine was administered for pain at 2055 as well as melatonin for rest. Around 2300 this nurse and tech change patient's brief. Patient continued to be very restless stating "help me get up." This nurse explained to patient that it was very late at night and unsafe to get patient out of the bed at this time. Nurse continued to attempt to re-direct patient and encourage patient to rest as he had a lengthy day of therapy tomorrow. Nurse administered prn Xanax at 0130. Patient slept for about two hours and began to call out again. Patient then began to bang on the side rail of the bed with his call button. Patient's needs were addressed again. Patient was assisted on the bedpan to try to have a bowel movement although he only voided. Nurse and tech assisted patient with peri care. Patient slept about another hour to an hour and a half and then started banging on side rail again and pushing his call button. This nurse and tech went to room and patient had pulled his IV out and had it hanging out of his mouth. Nurse and tech helped get patient cleaned up as there was a moderate amount of blood on patient and his gown from IV site. Nurse assessed patient's vital signs and they remain stable. Blood sugar at this time is 74. Patient continues to be A/O. Patient c/o back pain at this time 5/10 and PRN Vicodine administered at 0410. Patient has continued to insist that staff help patient get out of bed. Nurse again encouraged patient to rest for therapy in the morning. Patient is currently resting in bed quietly. Bed is in low position, bed alarm activated,  and call button within reach.

## 2020-09-28 NOTE — Evaluation (Signed)
Speech Language Pathology Assessment and Plan  Brent [name] Details  Name: Brent [name] MRN: [id number] Date of Birth: [birth year]  SLP Diagnosis: Dysarthria;Cognitive Impairments;Speech and Language deficits;Dysphagia  Rehab Potential: Fair ELOS: 4 weeks   Today's Date: 09/28/2020 SLP Individual Time: 1300-1400 SLP Individual Time Calculation (min): 60 min  Hospital Problem: Principal Problem:   CVA (cerebral vascular accident) ([place]) Active Problems:   Labile blood glucose   Leukocytosis   AKI (acute kidney injury) ([place])  Past Medical History:  Past Medical History:  Diagnosis Date   Aplastic anemia, unspecified (HCC)    Chest pain, unspecified    Chronic airway obstruction, not elsewhere classified    Cocaine substance abuse ([place])    Neuropathy    Other and unspecified hyperlipidemia    Shortness of breath    Stroke ([place]) 06/17/2020   Tobacco use disorder    Type II or unspecified type diabetes mellitus without mention of complication, not stated as uncontrolled    Unspecified epilepsy without mention of intractable epilepsy    Unspecified essential hypertension    Past Surgical History:  Past Surgical History:  Procedure Laterality Date   CARDIAC CATHETERIZATION  10/02/2010    Nonobstructive mild coronary plaque   IR GASTROSTOMY TUBE MOD SED  09/24/2020   KNEE SURGERY Bilateral 2018   Repair   PERFORATED VISCUS SURGERY     MULTIPLE FRACTURES    Assessment / Plan / Recommendation Clinical Impression  Brent [name] is a 55 y.o.  right-handed male with history of hypertension hyperlipidemia diabetes mellitus type 2 cocaine use as well as tobacco as well as recent CVA posterior limb right internal capsule and left basal ganglia 07/25/2020 maintained on aspirin,.  Per chart review Brent [name] lives with spouse.  1 level home with level entry.  Presented 09/11/2020 with acute onset of right-sided weakness and dysarthria.  Admission chemistries unremarkable except glucose 135, alcohol  negative, urine drug screen positive cocaine, hemoglobin A1c 6.4.  Cranial CT scan negative.  CTA head and neck negative for emergent large vessel occlusion.  MRI showed 2 adjacent acute infarcts within the right cingulate gyrus/colossal body(right ACA vascular territory) as well as 6 mm acute infarct within the right parietal lobe subcortical white matter and questionable punctate acute infarct within the left frontal lobe subcortical white matter.  Echocardiogram with ejection fraction 65 to 70% no wall motion abnormalities.  Currently maintained on aspirin 81 mg daily and Plavix 75 mg daily for CVA prophylaxis.  Subcutaneous Lovenox for DVT prophylaxis.  Currently n.p.o. with alternative means of nutritional support with gastrostomy tube placed 09/24/2020 per interventional radiology Dr. Vernard Gambles.Marland Kitchen  Hospital course complicated by sepsis secondary to aspiration pneumonia completed 14-day course of antibiotics and slowly weaned from oxygen.  Palliative care was consulted to establish goals of care.  Therapy evaluations completed due to Brent [name]'s right side weakness and dysarthria was admitted for a comprehensive rehab program. Currently complaining of leg pain. Accidentally stretched PEG yesterday which held up admission given the amount of bleeding he had. Hgb has fallen from 14.1 8/9 to 11.4 today, but no active signs of continued bleeding. Brent [name] transferred to CIR on 09/27/2020  Pt presented with trials of ice chips and presents with severe oropharyngeal dysphagia characterized by poor oral control, decreased bolus awareness, apparent delayed swallow initiation, multiple swallows per ice chips and overt s/s aspiration >75% of trials. Pt does have strong cough response. Pt has partial uvulectomy and significant hx of dysphagia, multiple MBSS's since 2020 recommending NTL and HTLs. Pt has  recurrent PNAs and question diet compliance in the past. During this hospital stay, pt made NPO following MBS 7/28 with  eventual PEG placement. Recommend pt to remain NPO at this time due to high risk of aspiration, overt s/s aspiration, compromised respiratory status and inability to self-monitor/impulsivity.   Pt with a SLUMS score 3/30 indicated severe cognitive impairment. Pt previously administered SLUMS on 7/27 with a score of 18/30, significant decline in all areas since first assessment. Pt with poor awareness of current deficits and precautions at this time, requesting to leave today despite impairments. Unsure what Brent [name]'s baseline cognitive status is following most recent stroke June 2022 as no family present at eval. Pt does have moderate dysarthric speech, reports this is baseline since previous CVA. Pt will benefit from skilled ST to further assess cognition, speech, language and swallow function and promote safety and independence with daily routine at discharge environment. Recommend f/u MBSS when pt appropriate.   Skilled Therapeutic Interventions          Pt participating in Bedside Swallow Evaluation, SLUMS assessment as well as further non-standardized assessments of speech, language and cognition. Please see above.   SLP Assessment  Brent [name] will need skilled Speech Lanaguage Pathology Services during CIR admission    Recommendations  SLP Diet Recommendations: NPO Medication Administration: Via alternative means Oral Care Recommendations: Oral care prior to ice chip/H20;Oral care QID Brent [name] destination: Home Follow up Recommendations: Home Health SLP;Outpatient SLP Equipment Recommended: To be determined    SLP Frequency 3 to 5 out of 7 days   SLP Duration  SLP Intensity  SLP Treatment/Interventions 4 weeks  Minumum of 1-2 x/day, 30 to 90 minutes  Cognitive remediation/compensation;Dysphagia/aspiration precaution training;Internal/external aids;Speech/Language facilitation;Therapeutic Activities;Cueing hierarchy;Brent [name]/family education;Functional tasks;Therapeutic Exercise     Pain Pain Assessment Pain Scale: 0-10 Pain Score: 0-No pain Pain Type: Chronic pain Pain Location: Back Pain Orientation: Lower Pain Descriptors / Indicators: Aching;Discomfort Pain Frequency: Intermittent Pain Onset: On-going Pain Intervention(s): Medication (See eMAR)  Prior Functioning Cognitive/Linguistic Baseline: Information not available (unsure of baseline cog following CVA in June) Type of Home: House  Lives With: Significant other Available Help at Discharge: Family;Available 24 hours/day Vocation: Retired  Programmer, systems Overall Cognitive Status: Impaired/Different from baseline Arousal/Alertness: Awake/alert Orientation Level: Oriented X4;Disoriented to time Attention: Focused;Sustained Focused Attention: Impaired Focused Attention Impairment: Verbal basic Sustained Attention: Impaired Sustained Attention Impairment: Verbal basic Memory: Impaired Memory Impairment: Storage deficit;Retrieval deficit;Decreased recall of new information Awareness: Impaired Awareness Impairment: Intellectual impairment Problem Solving: Impaired Problem Solving Impairment: Verbal basic;Functional basic Executive Function: Reasoning;Organizing Reasoning: Impaired Reasoning Impairment: Verbal basic;Functional basic Organizing: Impaired Organizing Impairment: Verbal basic;Functional basic Behaviors: Impulsive;Restless;Perseveration;Agitated Behavior Scale;Verbal agitation Safety/Judgment: Impaired  Comprehension Auditory Comprehension Overall Auditory Comprehension: Impaired Yes/No Questions: Impaired Complex Questions: 50-74% accurate Commands: Within Functional Limits Conversation: Simple Interfering Components: Attention EffectiveTechniques: Extra processing time Reading Comprehension Reading Status: Not tested Expression Expression Primary Mode of Expression: Verbal Verbal Expression Overall Verbal Expression: Appears within functional limits for tasks  assessed Oral Motor Oral Motor/Sensory Function Overall Oral Motor/Sensory Function: Moderate impairment Facial ROM: Reduced left Facial Symmetry: Abnormal symmetry left Facial Strength: Reduced left Facial Sensation: Within Functional Limits Lingual ROM: Reduced left Lingual Symmetry: Abnormal symmetry left Lingual Strength: Reduced;Suspected CN XII (hypoglossal) dysfunction Mandible: Within Functional Limits Motor Speech Overall Motor Speech: Impaired at baseline Respiration: Impaired Level of Impairment: Phrase Resonance: Hypernasality Intelligibility: Intelligibility reduced Word: 50-74% accurate Phrase: 50-74% accurate Sentence: 50-74% accurate Conversation: 50-74% accurate Motor Planning: Witnin functional limits Effective Techniques: Over-articulate;Slow  rate;Increased vocal intensity;Pause  Care Tool Care Tool Cognition Expression of Ideas and Wants Expression of Ideas and Wants: Some difficulty - exhibits some difficulty with expressing needs and ideas (e.g, some words or finishing thoughts) or speech is not clear   Understanding Verbal and Non-Verbal Content Understanding Verbal and Non-Verbal Content: Usually understands - understands most conversations, but misses some part/intent of message. Requires cues at times to understand   Memory/Recall Ability *first 3 days only      Intelligibility: Intelligibility reduced Word: 50-74% accurate Phrase: 50-74% accurate Sentence: 50-74% accurate Conversation: 50-74% accurate  Bedside Swallowing Assessment General Date of Onset: 09/11/20 Previous Swallow Assessment: MBS 07/26/20, regular and HTL Diet Prior to this Study: NPO Temperature Spikes Noted: No Respiratory Status: Supplemental O2 delivered via (comment) (2L [place]) History of Recent Intubation: No Behavior/Cognition: Agitated;Impulsive;Distractible;Requires cueing;Alert Oral Cavity - Dentition: Missing dentition Baseline Vocal Quality: Breathy;Low vocal  intensity Volitional Cough: Weak;Congested Volitional Swallow: Able to elicit  Oral Care Assessment Does Brent [name] have any of the following "high(er) risk" factors?: Diet - Brent [name] on tube feedings Does Brent [name] have any of the following "at risk" factors?: Oxygen therapy - cannula, mask, simple oxygen devices Brent [name] is HIGH RISK: Non-ventilated: Order set for Adult Oral Care Protocol initiated - "High Risk Patients - Non-Ventilated" option selected  (see row information) Brent [name] is AT RISK: Order set for Adult Oral Care Protocol initiated -  "At Risk Patients" option selected (see row information) Ice Chips Ice chips: Impaired Oral Phase Impairments: Poor awareness of bolus;Reduced lingual movement/coordination;Reduced labial seal Oral Phase Functional Implications: Prolonged oral transit;Right lateral sulci pocketing;Left lateral sulci pocketing;Oral residue Pharyngeal Phase Impairments: Throat Clearing - Immediate;Throat Clearing - Delayed;Cough - Immediate;Cough - Delayed Thin Liquid Thin Liquid: Not tested Nectar Thick Nectar Thick Liquid: Not tested Honey Thick Honey Thick Liquid: Not tested Puree Puree: Not tested Solid Solid: Not tested BSE Assessment Risk for Aspiration Impact on safety and function: Severe aspiration risk Other Related Risk Factors: History of dysphagia;Previous CVA;Decreased respiratory status;History of pneumonia  Short Term Goals: Week 1: SLP Short Term Goal 1 (Week 1): Pt will consume trial of ice chips with s/s aspiration <50% of trials with mod cues for swallow strategies SLP Short Term Goal 2 (Week 1): Pt will recall functional information with 75% accuracy mod A cues SLP Short Term Goal 3 (Week 1): Pt will utilize compensatory strategies for speech intelligibility with mod A cues SLP Short Term Goal 4 (Week 1): Pt will ID 3-5 functional changes since CVA with mod A cues SLP Short Term Goal 5 (Week 1): Pt will complete simple problem solving tasks for  daily routine with 75% accuracy mod A cues SLP Short Term Goal 6 (Week 1): Pt will increase sustained attention to 3 minutes provided mod A cues  Refer to Care Plan for Long Term Goals  Recommendations for other services: None   Discharge Criteria: Brent [name] will be discharged from SLP if Brent [name] refuses treatment 3 consecutive times without medical reason, if treatment goals not met, if there is a change in medical status, if Brent [name] makes no progress towards goals or if Brent [name] is discharged from hospital.  The above assessment, treatment plan, treatment alternatives and goals were discussed and mutually agreed upon: by Brent [name]  Dewaine Conger 09/28/2020, 3:16 PM

## 2020-09-29 ENCOUNTER — Inpatient Hospital Stay (HOSPITAL_COMMUNITY): Payer: Medicaid Other

## 2020-09-29 DIAGNOSIS — Z931 Gastrostomy status: Secondary | ICD-10-CM

## 2020-09-29 LAB — GLUCOSE, CAPILLARY
Glucose-Capillary: 71 mg/dL (ref 70–99)
Glucose-Capillary: 194 mg/dL — ABNORMAL HIGH (ref 70–99)
Glucose-Capillary: 139 mg/dL — ABNORMAL HIGH (ref 70–99)
Glucose-Capillary: 74 mg/dL (ref 70–99)
Glucose-Capillary: 90 mg/dL (ref 70–99)

## 2020-09-29 MED ORDER — DIATRIZOATE MEGLUMINE & SODIUM 66-10 % PO SOLN
30.0000 mL | Freq: Once | ORAL | Status: AC
  Administered 2020-09-29: 20 mL
  Filled 2020-09-29: qty 30

## 2020-09-29 MED ORDER — QUETIAPINE FUMARATE 25 MG PO TABS
12.5000 mg | ORAL_TABLET | Freq: Every day | ORAL | Status: DC
  Administered 2020-09-29: 12.5 mg via ORAL
  Filled 2020-09-29: qty 1

## 2020-09-29 MED ORDER — DIATRIZOATE MEGLUMINE & SODIUM 66-10 % PO SOLN
ORAL | Status: AC
  Filled 2020-09-29: qty 30

## 2020-09-29 MED ORDER — INSULIN DETEMIR 100 UNIT/ML ~~LOC~~ SOLN
25.0000 [IU] | Freq: Two times a day (BID) | SUBCUTANEOUS | Status: DC
  Administered 2020-09-29 – 2020-09-30 (×3): 25 [IU] via SUBCUTANEOUS
  Filled 2020-09-29 (×5): qty 0.25

## 2020-09-29 NOTE — Progress Notes (Signed)
[facility] PHYSICAL MEDICINE & REHABILITATION PROGRESS NOTE  Subjective/Complaints: Patient seen sitting up in bed this morning.  He states he slept well overnight, however per nursing, patient did not sleep at all.  He was impulsive and hitting the call bell all night.  During the day patient managed to pull back PEG tube, readjusted and abdominal film ordered.  ROS: Limited due to cognition.  Objective: Vital Signs: Blood pressure (!) 159/96, pulse 92, temperature 98.6 F (37 C), temperature source Oral, resp. rate 18, height 6' (1.829 m), weight 82.1 kg, SpO2 94 %. DG Abd 1 View  Result Date: 09/29/2020 CLINICAL DATA:  Peg tube placement. EXAM: ABDOMEN - 1 VIEW COMPARISON:  None. FINDINGS: An image was obtained after injection through the PEG tube. Contrast fills the distal stomach and the duodenum. No evidence of leak. IMPRESSION: Peg tube appears to be in good position with contrast in the distal stomach and duodenum. No leak identified. Electronically Signed   By: Gerome Sam III M.D.   On: 09/29/2020 12:43   Recent Labs    09/27/20 0540  WBC 22.9*  HGB 11.4*  HCT 34.5*  PLT 276    Recent Labs    09/27/20 0540  NA 145  K 5.0  CL 112*  CO2 25  GLUCOSE 168*  BUN 71*  CREATININE 1.40*  CALCIUM 9.9     Intake/Output Summary (Last 24 hours) at 09/29/2020 2106 Last data filed at 09/29/2020 1827 Gross per 24 hour  Intake 0 ml  Output --  Net 0 ml         Physical Exam: BP (!) 159/96 (BP Location: Left Arm)   Pulse 92   Temp 98.6 F (37 C) (Oral)   Resp 18   Ht 6' (1.829 m)   Wt 82.1 kg   SpO2 94%   BMI 24.55 kg/m  Constitutional: No distress . Vital signs reviewed. HENT: Normocephalic.  Atraumatic. Eyes: EOMI. No discharge. Cardiovascular: No JVD.  RRR. Respiratory: Normal effort.  No stridor.  Bilateral clear to auscultation. GI: Non-distended.  BS +.  + PEG. Skin: Warm and dry.  Intact. Psych: Impulsive.  Hyperactive. Musc: No edema in  extremities.  No tenderness in extremities. Neuro: Alert Dysarthria, unchanged LUE: Shoulder abduction, elbow flexion/extension 1/5, handgrip 3/5 LLE: 1/5 proximal distal  Assessment/Plan: 1. Functional deficits which require 3+ hours per day of interdisciplinary therapy in a comprehensive inpatient rehab setting. Physiatrist is providing close team supervision and 24 hour management of active medical problems listed below. Physiatrist and rehab team continue to assess barriers to discharge/monitor patient progress toward functional and medical goals   Care Tool:  Bathing    Body parts bathed by patient: Chest, Left arm, Right upper leg, Left upper leg, Face, Right lower leg, Abdomen, Front perineal area   Body parts bathed by helper: Right arm, Buttocks, Left lower leg     Bathing assist Assist Level: 2 Helpers     Upper Body Dressing/Undressing Upper body dressing   What is the patient wearing?: Pull over shirt    Upper body assist Assist Level: Dependent - Patient 0%    Lower Body Dressing/Undressing Lower body dressing      What is the patient wearing?: Pants, Incontinence brief     Lower body assist Assist for lower body dressing: 2 Helpers     Toileting Toileting    Toileting assist Assist for toileting: Dependent - Patient 0%     Transfers Chair/bed transfer  Transfers assist  Chair/bed transfer activity did not occur: Safety/medical concerns  Chair/bed transfer assist level: 2 Helpers     Locomotion Ambulation   Ambulation assist   Ambulation activity did not occur: Safety/medical concerns          Walk 10 feet activity   Assist  Walk 10 feet activity did not occur: Safety/medical concerns        Walk 50 feet activity   Assist Walk 50 feet with 2 turns activity did not occur: Safety/medical concerns         Walk 150 feet activity   Assist Walk 150 feet activity did not occur: Safety/medical concerns         Walk 10  feet on uneven surface  activity   Assist Walk 10 feet on uneven surfaces activity did not occur: Safety/medical concerns         Wheelchair     Assist Will patient use wheelchair at discharge?: Yes   Wheelchair activity did not occur: Safety/medical concerns         Wheelchair 50 feet with 2 turns activity    Assist    Wheelchair 50 feet with 2 turns activity did not occur: Safety/medical concerns       Wheelchair 150 feet activity     Assist  Wheelchair 150 feet activity did not occur: Safety/medical concerns        Medical Problem List and Plan: 1.   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 Hx of prior stroke 6/22.  Continue CIR 2.  Impaired mobility: -DVT/anticoagulation:  Pharmaceutical: Lovenox             -antiplatelet therapy: Continue Aspirin 81 mg daily and Plavix 75 mg dailyx 3 weeks 3. Pain Management: d/c IV Dilaudid. Hydrocodone as needed  Appears controlled on 8/14 4. Anxiety: Continue Xanax as needed              -antipsychotic agents: Seroquel 12.5 nightly started on 8/14 5. Neuropsych: This patient is not capable of making decisions on his 1.40 on 8/12 behalf.  Telemetry sitter for safety  Wrist restraints for safety 6. Skin/Wound Care: Routine skin checks 7. 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 -SLP follow up, advance diet as tolerated 9. Cocaine/tobacco abuse.UDS positive cocaine.  Counseled on appropriate 10.  Diabetes mellitus.  Hemoglobin A1c 6.4.  NovoLog 3 units every 4 hours and Levemir 30 units twice daily.  Diabetic teaching  Labile on 8/14, Levemir decreased to 25 twice daily on 8/14 11.  Hyperlipidemia.   Crestor/fenofibrate 12.Aspiration pneumonia.  Completed 14 days antibiotic 13.  Constipation.  MiraLAX 14. Mild anemia   Hemoglobin 11.4 on 8/12, continue to monitor 15.  Leukocytosis  WBCs 22.9 on 8/12, continue to monitor 16.  AKI  Creatinine 1.40 on 8/12, labs ordered for tomorrow  IVF initiated  LOS: 2 days A FACE TO FACE EVALUATION WAS PERFORMED  Tareva Leske Karis Juba 09/29/2020, 9:06 PM

## 2020-09-29 NOTE — Plan of Care (Signed)
  Problem: RH Balance Goal: LTG Patient will maintain dynamic sitting balance (PT) Description: LTG:  Patient will maintain dynamic sitting balance with assistance during mobility activities (PT) Flowsheets (Taken 09/29/2020 1143) LTG: Pt will maintain dynamic sitting balance during mobility activities with:: Supervision/Verbal cueing Goal: LTG Patient will maintain dynamic standing balance (PT) Description: LTG:  Patient will maintain dynamic standing balance with assistance during mobility activities (PT) Flowsheets (Taken 09/29/2020 1143) LTG: Pt will maintain dynamic standing balance during mobility activities with:: Moderate Assistance - Patient 50 - 74%   Problem: Sit to Stand Goal: LTG:  Patient will perform sit to stand with assistance level (PT) Description: LTG:  Patient will perform sit to stand with assistance level (PT) Flowsheets (Taken 09/29/2020 1143) LTG: PT will perform sit to stand in preparation for functional mobility with assistance level: Minimal Assistance - Patient > 75%   Problem: RH Bed Mobility Goal: LTG Patient will perform bed mobility with assist (PT) Description: LTG: Patient will perform bed mobility with assistance, with/without cues (PT). Flowsheets (Taken 09/29/2020 1143) LTG: Pt will perform bed mobility with assistance level of: Minimal Assistance - Patient > 75%   Problem: RH Bed to Chair Transfers Goal: LTG Patient will perform bed/chair transfers w/assist (PT) Description: LTG: Patient will perform bed to chair transfers with assistance (PT). Flowsheets (Taken 09/29/2020 1143) LTG: Pt will perform Bed to Chair Transfers with assistance level: Minimal Assistance - Patient > 75%   Problem: RH Car Transfers Goal: LTG Patient will perform car transfers with assist (PT) Description: LTG: Patient will perform car transfers with assistance (PT). Flowsheets (Taken 09/29/2020 1143) LTG: Pt will perform car transfers with assist:: Moderate Assistance - Patient  50 - 74%   Problem: RH Ambulation Goal: LTG Patient will ambulate in controlled environment (PT) Description: LTG: Patient will ambulate in a controlled environment, # of feet with assistance (PT). Flowsheets (Taken 09/29/2020 1143) LTG: Pt will ambulate in controlled environ  assist needed:: Moderate Assistance - Patient 50 - 74% LTG: Ambulation distance in controlled environment: 15ft using LRAD   Problem: RH Wheelchair Mobility Goal: LTG Patient will propel w/c in controlled environment (PT) Description: LTG: Patient will propel wheelchair in controlled environment, # of feet with assist (PT) Flowsheets (Taken 09/29/2020 1143) LTG: Pt will propel w/c in controlled environ  assist needed:: Supervision/Verbal cueing LTG: Propel w/c distance in controlled environment: 167ft Goal: LTG Patient will propel w/c in home environment (PT) Description: LTG: Patient will propel wheelchair in home environment, # of feet with assistance (PT). Flowsheets (Taken 09/29/2020 1143) LTG: Pt will propel w/c in home environ  assist needed:: Supervision/Verbal cueing LTG: Propel w/c distance in home environment: 87ft

## 2020-09-29 NOTE — Progress Notes (Signed)
Pt found by Charge RN to have PEG tube pulled out a few inches-PEG Re -inserted-MD patel notified-KUB ordered-feeds on hold until KUB resulted

## 2020-09-29 NOTE — Progress Notes (Signed)
Patient has called out multiple times during night shift. Patient pushes the call button with staff standing in the room. Patient assessed , vital signs are stable. Patient keeps yelling out from time to time: "Help me to get OOB" . PRN medications administered as ordered. Melatonin and Xanax were given 22:44, no effect, Vicodin at 23:52 for back pain, effective. Writer tried to re-direct patient and encouraged him to rest . Patient slept from 00:30 am until 02:00 am, then patient wake up and start to bang on the side rail with his call button. Pt needs were addressed, patient remains incont. Of bladder during this shift. Than patient removed all his covers, night gown. Patient was dressed several times. Ice chips were administered per instructions. Patient slept again for another hour from 4 am to 5 am. Patient keeps insisting to help him to get OOB. Patient was redirected again, no results. Bed alarm is on, telesitter renewed, call button within reach.

## 2020-09-29 NOTE — Progress Notes (Signed)
Per final read of KUB-PEG TUBE IN GOOD POSITION-OK to use-Tube feeds resumed and PRN meds administered

## 2020-09-30 ENCOUNTER — Inpatient Hospital Stay (HOSPITAL_COMMUNITY): Payer: Medicaid Other

## 2020-09-30 DIAGNOSIS — E440 Moderate protein-calorie malnutrition: Secondary | ICD-10-CM | POA: Insufficient documentation

## 2020-09-30 LAB — COMPREHENSIVE METABOLIC PANEL
ALT: 15 U/L (ref 0–44)
AST: 19 U/L (ref 15–41)
Albumin: 2.8 g/dL — ABNORMAL LOW (ref 3.5–5.0)
Alkaline Phosphatase: 53 U/L (ref 38–126)
Anion gap: 6 (ref 5–15)
BUN: 35 mg/dL — ABNORMAL HIGH (ref 6–20)
CO2: 27 mmol/L (ref 22–32)
Calcium: 9.3 mg/dL (ref 8.9–10.3)
Chloride: 116 mmol/L — ABNORMAL HIGH (ref 98–111)
Creatinine, Ser: 1.16 mg/dL (ref 0.61–1.24)
GFR, Estimated: >60 mL/min (ref >60)
Glucose, Bld: 84 mg/dL (ref 70–99)
Potassium: 3.7 mmol/L (ref 3.5–5.1)
Sodium: 149 mmol/L — ABNORMAL HIGH (ref 135–145)
Total Bilirubin: 0.6 mg/dL (ref 0.3–1.2)
Total Protein: 6 g/dL — ABNORMAL LOW (ref 6.5–8.1)

## 2020-09-30 LAB — CBC WITH DIFFERENTIAL/PLATELET
Abs Immature Granulocytes: 0.07 10*3/uL (ref 0.00–0.07)
Basophils Absolute: 0.1 10*3/uL (ref 0.0–0.1)
Basophils Relative: 1 %
Eosinophils Absolute: 0.4 10*3/uL (ref 0.0–0.5)
Eosinophils Relative: 3 %
HCT: 26.2 % — ABNORMAL LOW (ref 39.0–52.0)
Hemoglobin: 8.5 g/dL — ABNORMAL LOW (ref 13.0–17.0)
Immature Granulocytes: 1 %
Lymphocytes Relative: 11 %
Lymphs Abs: 1.4 10*3/uL (ref 0.7–4.0)
MCH: 32.2 pg (ref 26.0–34.0)
MCHC: 32.4 g/dL (ref 30.0–36.0)
MCV: 99.2 fL (ref 80.0–100.0)
Monocytes Absolute: 0.6 10*3/uL (ref 0.1–1.0)
Monocytes Relative: 5 %
Neutro Abs: 10 10*3/uL — ABNORMAL HIGH (ref 1.7–7.7)
Neutrophils Relative %: 79 %
Platelets: 279 10*3/uL (ref 150–400)
RBC: 2.64 MIL/uL — ABNORMAL LOW (ref 4.22–5.81)
RDW: 13.8 % (ref 11.5–15.5)
WBC: 12.6 10*3/uL — ABNORMAL HIGH (ref 4.0–10.5)
nRBC: 0 % (ref 0.0–0.2)

## 2020-09-30 LAB — GLUCOSE, CAPILLARY
Glucose-Capillary: 163 mg/dL — ABNORMAL HIGH (ref 70–99)
Glucose-Capillary: 169 mg/dL — ABNORMAL HIGH (ref 70–99)
Glucose-Capillary: 175 mg/dL — ABNORMAL HIGH (ref 70–99)
Glucose-Capillary: 175 mg/dL — ABNORMAL HIGH (ref 70–99)
Glucose-Capillary: 74 mg/dL (ref 70–99)
Glucose-Capillary: 81 mg/dL (ref 70–99)

## 2020-09-30 IMAGING — CR DG ABDOMEN 1V
1 series · 1 of 1 positions shown · non-contrast
Comparison: Radiograph yesterday.

CLINICAL DATA: Peg placement.

EXAM:
ABDOMEN - 1 VIEW

[abdomen kub]
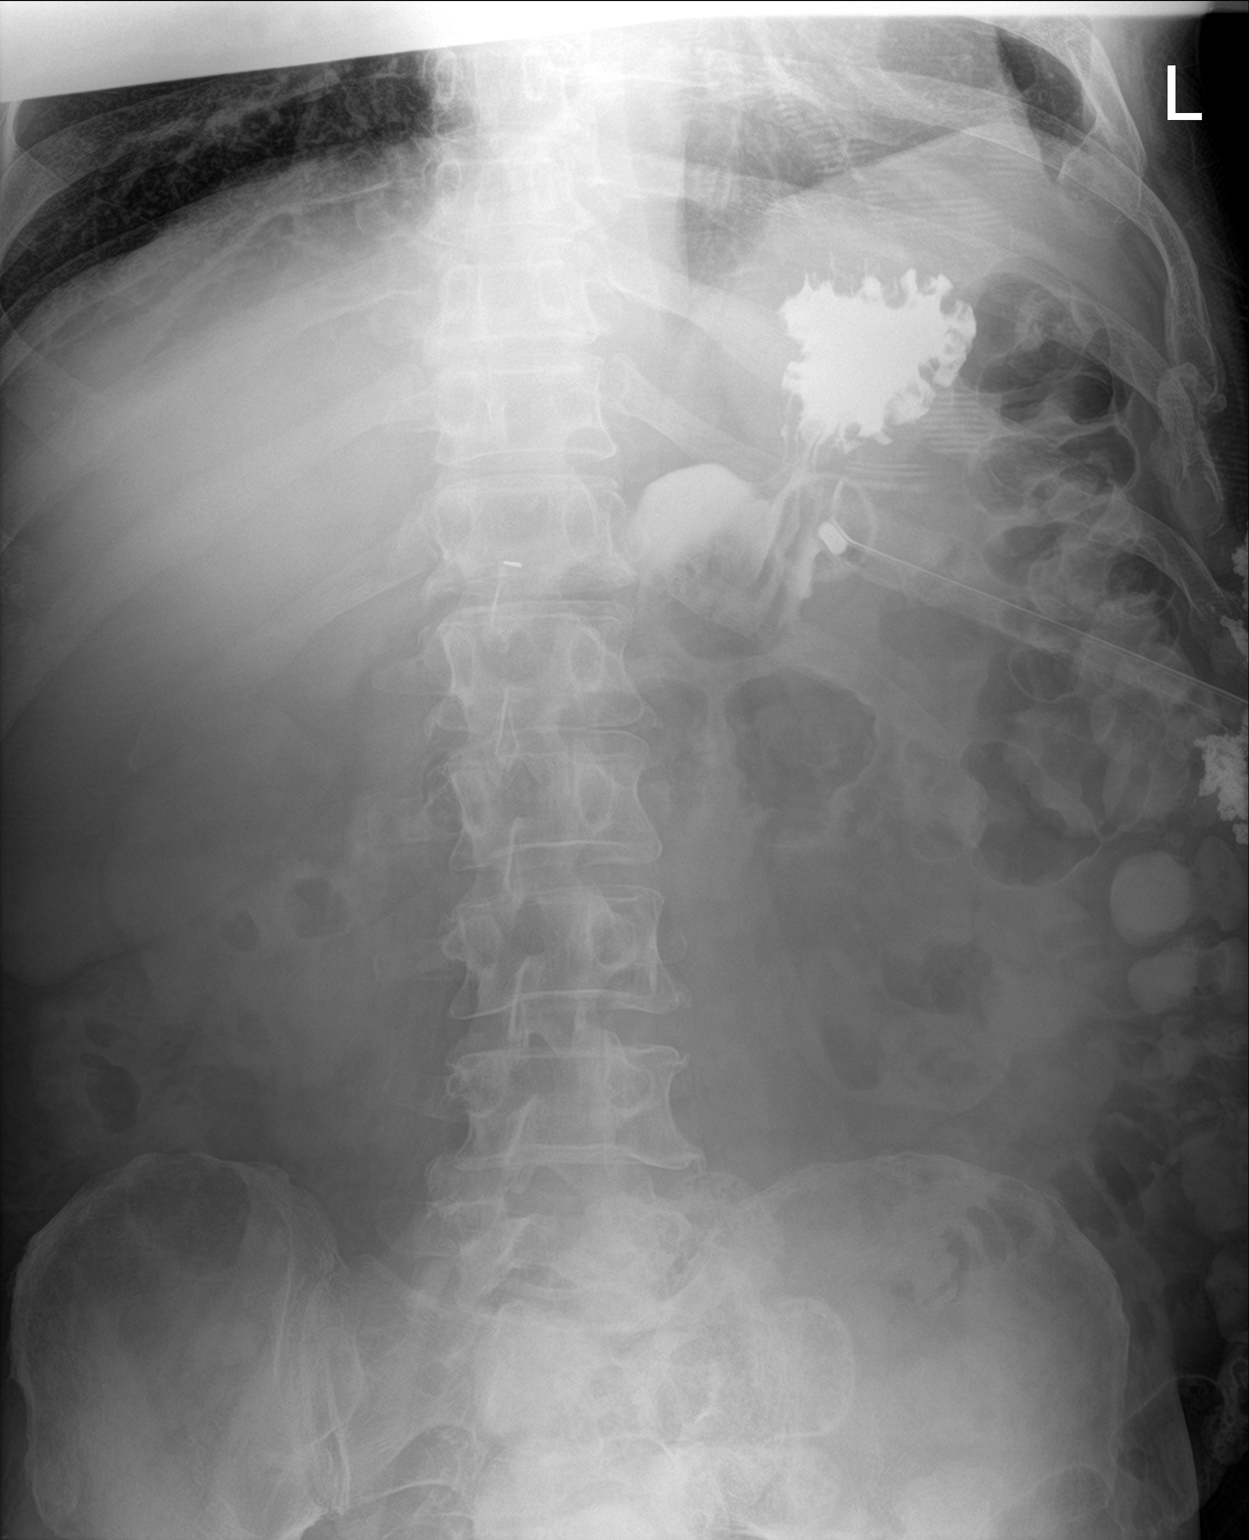

[1 of 1 positions shown; findings below may reference images not displayed]

FINDINGS: Single frontal view of the abdomen obtained. Gastrostomy tube
projects over the left upper quadrant in the region of the stomach.
There is contrast opacifying the gastric lumen. Type and amount of
contrast is not specified. There is no evidence of extravasation or
leak. Contrast is visualized about the left lateral abdomen but is
likely external to the patient, and is lateral to the colon only
partially included in the field of view. There is contrast in the
colon from yesterday's exam.
IMPRESSION: 1. Gastrostomy tube in the stomach with contrast opacifying the
gastric lumen. No evidence of extravasation or leak.
2. Contrast about the left lateral abdomen is likely external to the
patient, and only partially included in the field of view.
3. There is contrast within the left colon from yesterday's exam.

## 2020-09-30 MED ORDER — DIATRIZOATE MEGLUMINE & SODIUM 66-10 % PO SOLN
ORAL | Status: AC
  Administered 2020-09-30: 30 mL via GASTROSTOMY
  Filled 2020-09-30: qty 30

## 2020-09-30 MED ORDER — QUETIAPINE FUMARATE 25 MG PO TABS
12.5000 mg | ORAL_TABLET | Freq: Every day | ORAL | Status: DC
  Administered 2020-09-30: 12.5 mg
  Filled 2020-09-30: qty 1

## 2020-09-30 NOTE — Progress Notes (Signed)
Inpatient Rehabilitation Care Coordinator Assessment and Plan Patient Details  Name: Brent [name] MRN: [id number] Date of Birth: Nov 02, 1965  Today's Date: 09/30/2020  Hospital Problems: Principal Problem:   CVA (cerebral vascular accident) [facility]) Active Problems:   Labile blood glucose   Leukocytosis   AKI (acute kidney injury) (HCC)   S/P percutaneous endoscopic gastrostomy (PEG) tube placement [facility])  Past Medical History:  Past Medical History:  Diagnosis Date   Aplastic anemia, unspecified (HCC)    Chest pain, unspecified    Chronic airway obstruction, not elsewhere classified    Cocaine substance abuse (HCC)    Neuropathy    Other and unspecified hyperlipidemia    Shortness of breath    Stroke (HCC) 06/17/2020   Tobacco use disorder    Type II or unspecified type diabetes mellitus without mention of complication, not stated as uncontrolled    Unspecified epilepsy without mention of intractable epilepsy    Unspecified essential hypertension    Past Surgical History:  Past Surgical History:  Procedure Laterality Date   CARDIAC CATHETERIZATION  10/02/2010    Nonobstructive mild coronary plaque   IR GASTROSTOMY TUBE MOD SED  09/24/2020   KNEE SURGERY Bilateral 2018   Repair   PERFORATED VISCUS SURGERY     MULTIPLE FRACTURES   Social History:  reports that he has been smoking cigarettes. He has a 30.00 pack-year smoking history. He has never used smokeless tobacco. He reports current alcohol use. He reports that he does not use drugs.  Family / Support Systems Marital Status: Single Patient Roles: Partner, Parent, Other (Comment) (son) Spouse/Significant Other: Clydie Braun Gam[place]-girlfreind [phone] 28 years together Children: Son and daughter Other Supports: [name] -Mom [place] [phone] Anticipated Caregiver: Clydie Braun Ability/Limitations of Caregiver: Clydie Braun is CNA 30 years Caregiver Availability: 24/7 Family Dynamics: Close with each  other and children,along with extended family. Pt is quite stubborn and does what he wants.  Social History Preferred language: English Religion:  Cultural Background: No issues Education: HS Read: Yes Write: Yes Employment Status: Disabled Marine scientist Issues: No issues Guardian/Conservator: None-according to MD pt is capable of making his own decisions while here   Abuse/Neglect Abuse/Neglect Assessment Can Be Completed: Yes Physical Abuse: Denies Verbal Abuse: Denies Sexual Abuse: Denies Exploitation of patient/patient's resources: Denies Self-Neglect: Denies  Emotional Status Pt's affect, behavior and adjustment status: Pt is frustrated he can't have water and wants it. Pt has had several CVA's and has recovered from the other ones and has to start over again. Clydie Braun quit job to care for him but has bills now. Recent Psychosocial Issues: other health issues past history of CVA's Psychiatric History: No hisotry deferred depression due to adjusting to rehab and would benefit from seeing neruo-psych while here due to has had numerous CVA's since last 01/2020 Substance Abuse History: cocaine and Tobacco-was positive, aware contributed to his stroke and now plans to quit.  Patient / Family Perceptions, Expectations & Goals Pt/Family understanding of illness & functional limitations: Pt is aware he had a stroke, Clydie Braun is a Lawyer by trade and is well versed in medical terminology. She and his Mom have spoken with the MD and feel they understand the treatment plan for him and are hopeful he will do well here. Premorbid pt/family roles/activities: Father, partner, retiree, etc Anticipated changes in roles/activities/participation: resume Pt/family expectations/goals: Pt states: ": I want to get out of here." Clydie Braun states: " I hope he can get to the level I can manage him at home, we'll see."  Community Resources    Discharge Planning Living Arrangements: Spouse/significant  other Support Systems: Spouse/significant other, Children, Other relatives, Friends/neighbors Type of Residence: Private residence Insurance Resources: [employer] (specify county) Architect: SSD, Family Support Financial Screen Referred: No Living Expenses: Psychologist, sport and exercise Management: Significant Other, Family Does the patient have any problems obtaining your medications?: No Home Management: Clydie Braun does Patient/Family Preliminary Plans: Discharge plan will depend upon pt's level according to Clydie Braun she wants to be able to manage his care needs. Made aware if he is SNF will lose his check and difficulty due to medicaid and substance abuse issues. Pt is upset with family wants water and to get out of the hospital. Care Coordinator Barriers to Discharge: Medication compliance, Insurance for SNF coverage, Other (comments) Care Coordinator Anticipated Follow Up Needs: HH/OP, SNF  Clinical Impression Pt is somewhat frustrated can not have water and family will not bring him food/drink or take him out of here. [name]-partner is involved and hopes to assist him at discharge. She is a Lawyer by trade and has assisted him with past strokes. Will work on discharge needs.  Lucy Chris 09/30/2020, 12:07 PM

## 2020-09-30 NOTE — Progress Notes (Signed)
Speech Language Pathology Daily Session Note  Patient Details  Name: Brent [name] MRN: [id number] Date of Birth: 12/30/1965  Today's Date: 09/30/2020 SLP Individual Time: 0930-1000 SLP Individual Time Calculation (min): 30 min and Today's Date: 09/30/2020 SLP Missed Time: 15 Minutes Missed Time Reason: Nursing care  Short Term Goals: Week 1: SLP Short Term Goal 1 (Week 1): Pt will consume trial of ice chips with s/s aspiration <50% of trials with mod cues for swallow strategies SLP Short Term Goal 2 (Week 1): Pt will recall functional information with 75% accuracy mod A cues SLP Short Term Goal 3 (Week 1): Pt will utilize compensatory strategies for speech intelligibility with mod A cues SLP Short Term Goal 4 (Week 1): Pt will ID 3-5 functional changes since CVA with mod A cues SLP Short Term Goal 5 (Week 1): Pt will complete simple problem solving tasks for daily routine with 75% accuracy mod A cues SLP Short Term Goal 6 (Week 1): Pt will increase sustained attention to 3 minutes provided mod A cues  Skilled Therapeutic Interventions: Patient agreeable to skilled ST intervention with focus on swallow and cognitive goals. SLP facilitated set-up for oral care using suction toothbrush and sup A verbal cues for thoroughness. Patient frequently requested water and for solid PO intake such as hot dogs. SLP provided consistent education on clinical reasoning for NPO status and safety concern with a PO diet at this time. Patient exhibited limited insight into deficits nor carry over/understanding of this and would consistently ask the same questions over-and-over again. Patient consumed single ice chips and exhibited immediate cough during 5/6 trials with mod-to-max A verbal cues for swallow safety (no talking with ice in his mouth, taking his time, neutral head positioning).Patient was left in bed with wrist restraints applied, alarm activated, and immediate needs within reach at end of session.  Continue per current plan of care.      Pain Pain Assessment Pain Scale: 0-10 Pain Score: 8  Pain Location: Back Pain Orientation: Lower Pain Descriptors / Indicators: Aching Pain Onset: On-going Pain Intervention(s): Medication (See eMAR);Repositioned  Therapy/Group: Individual Therapy  Tamala Ser 09/30/2020, 12:56 PM

## 2020-09-30 NOTE — Progress Notes (Signed)
Inpatient Rehabilitation  Patient information reviewed and entered into eRehab system by Lukka Black Maleeyah Mccaughey, OTR/L.   Information including medical coding, functional ability and quality indicators will be reviewed and updated through discharge.    

## 2020-09-30 NOTE — Progress Notes (Signed)
Occupational Therapy Session Note  Patient Details  Name: Brent [name] MRN: [id number] Date of Birth: [birth year]  Today's Date: 09/30/2020 OT Individual Time: 7408-1448 OT Individual Time Calculation (min): 42 min    Short Term Goals: Week 1:  OT Short Term Goal 1 (Week 1): Pt will maintain sustained attention to basic selfcare/grooming tasks with no more than mod instructional cueing for re-direction. OT Short Term Goal 2 (Week 1): Pt will maintain static sitting balance EOB with supervision for 4 mins in preparation for selfcare tasks. OT Short Term Goal 3 (Week 1): Pt will complete UB bathing with min assist for two consecutive sessions. OT Short Term Goal 4 (Week 1): Pt will complete sit to stand with total +2 (pt 30%) during LB bathing or dressing.  Skilled Therapeutic Interventions/Progress Updates:    Pt greeted at time of session semireclined in bed finishing up breathing treatment with RT, once finished engaged in OT session. Note pt saying throughout session "get these off of me (referring to wrist restraints)" and "let me out of this place I am going to sue you people." Pt also cursing throughout session but not physically aggressive toward therapist. Engaged in OT session with theraist removing restraints, supine > sit EOB Max A and posterior lean/R lean noted with pt unable to correct without Max A, fading to sitting CGA at times. Performed oral hygiene Supervision EOB using L hand to hold toothpaste and R hand to brush, Min A for suctioning. Therapist providing pt with 5 ice chips individually with pt noted to cough on 2/5 ice chips but able to clear with cough. Pt using phone to call mother and mother having questions for therapist, answered questions as able. Returned to supine and donned gown bed level. Reapplied wrist restraints. Alarm on call bell in reach.    Therapy Documentation Precautions:  Precautions Precautions: Fall, Other (comment) Precaution Comments:  supplemental O2 via [place], L hemi, impaired safety awareness, impulsive Restrictions Weight Bearing Restrictions: No     Therapy/Group: Individual Therapy  Erasmo Score 09/30/2020, 7:17 AM

## 2020-09-30 NOTE — IPOC Note (Signed)
Overall Plan of Care [facility]) Patient Details Name: Brent [name] MRN: [id number] DOB: January 14, 1966  Admitting Diagnosis: CVA (cerebral vascular accident) [facility])  Hospital Problems: Principal Problem:   CVA (cerebral vascular accident) (HCC) Active Problems:   Labile blood glucose   Leukocytosis   AKI (acute kidney injury) (HCC)   S/P percutaneous endoscopic gastrostomy (PEG) tube placement (HCC)   Malnutrition of moderate degree     Functional Problem List: Nursing Bladder, Endurance, Medication Management, Nutrition, Pain, Perception, Safety  PT Balance, Safety, Behavior, Sensory, Skin Integrity, Edema, Endurance, Motor, Nutrition, Pain, Perception  OT Balance, Behavior, Cognition, Endurance, Motor, Vision, Sensory, Safety, Perception  SLP Cognition, Endurance, Nutrition, Safety  TR         Basic ADL's: OT Eating, Grooming, Bathing, Dressing, Toileting     Advanced  ADL's: OT       Transfers: PT Bed Mobility, Bed to Chair, Car, Occupational psychologist, Research scientist (life sciences): PT Ambulation, Psychologist, prison and probation services, Stairs     Additional Impairments: OT Fuctional Use of Upper Extremity  SLP Swallowing, Social Cognition   Problem Solving, Memory, Attention, Awareness  TR      Anticipated Outcomes Item Anticipated Outcome  Self Feeding supervision  Swallowing  min A pleasure feed vs NPO vs altered diet   Basic self-care  mod assist  Toileting  mod assist   Bathroom Transfers min assist  Bowel/Bladder  min assist with bladder  Transfers  min assist using LRAD  Locomotion  mod assist using LRA  Communication     Cognition  Min A for basic cog  Pain  <3  Safety/Judgment  min assist and no falls   Therapy Plan: PT Intensity: Minimum of 1-2 x/day ,45 to 90 minutes PT Frequency: 5 out of 7 days PT Duration Estimated Length of Stay: ~ 4 weeks OT Intensity: Minimum of 1-2 x/day, 45 to 90 minutes OT Frequency: 5 out of 7 days OT Duration/Estimated  Length of Stay: 24-28 days SLP Intensity: Minumum of 1-2 x/day, 30 to 90 minutes SLP Frequency: 3 to 5 out of 7 days SLP Duration/Estimated Length of Stay: 4 weeks   Due to the current state of emergency, patients may not be receiving their 3-hours of Medicare-mandated therapy.   Team Interventions: Nursing Interventions Patient/Family Education, Bladder Management, Disease Management/Prevention, Pain Management, Medication Management, Discharge Planning  PT interventions Community reintegration, Ambulation/gait training, DME/adaptive equipment instruction, Neuromuscular re-education, Psychosocial support, Stair training, UE/LE Strength taining/ROM, Wheelchair propulsion/positioning, Warden/ranger, Discharge planning, Functional electrical stimulation, Pain management, Skin care/wound management, Therapeutic Activities, UE/LE Coordination activities, Cognitive remediation/compensation, Disease management/prevention, Functional mobility training, Patient/family education, Splinting/orthotics, Therapeutic Exercise, Visual/perceptual remediation/compensation  OT Interventions Balance/vestibular training, Discharge planning, Functional electrical stimulation, Pain management, Self Care/advanced ADL retraining, Therapeutic Activities, UE/LE Coordination activities, Visual/perceptual remediation/compensation, Therapeutic Exercise, Patient/family education, Functional mobility training, Disease mangement/prevention, Cognitive remediation/compensation, DME/adaptive equipment instruction, Neuromuscular re-education, Psychosocial support, Splinting/orthotics, UE/LE Strength taining/ROM, Wheelchair propulsion/positioning  SLP Interventions Cognitive remediation/compensation, Dysphagia/aspiration precaution training, Internal/external aids, Speech/Language facilitation, Therapeutic Activities, Cueing hierarchy, Patient/family education, Functional tasks, Therapeutic Exercise  TR Interventions    SW/CM  Interventions Discharge Planning, Psychosocial Support, Patient/Family Education   Barriers to Discharge MD  Medical stability  Nursing Decreased caregiver support, Incontinence, Lack of/limited family support, Medication compliance, Nutrition means Lives with girlfriend of 38yrs in a 1 level home with level entry. Girlfriend and daughter are contacts. Girlfriend is a CNA and will be caregiver 24/7.  PT Home environment access/layout, Nutrition means, Behavior  OT Decreased caregiver support Will need 24 hr assist and pt reports that spouse works.  SLP Nutrition means PEG tube, significant hx of dysphagia/aspiration/PNA/altered diet  SW Medication compliance, Insurance for SNF coverage, Other (comments)     Team Discharge Planning: Destination: PT-Home ,OT- Home , SLP-Home Projected Follow-up: PT-Home health PT, 24 hour supervision/assistance, OT-  24 hour supervision/assistance, Home health OT, SLP-Home Health SLP, Outpatient SLP Projected Equipment Needs: PT-To be determined, OT- To be determined, SLP-To be determined Equipment Details: PT- , OT-  Patient/family involved in discharge planning: PT- Patient,  OT-Patient, SLP-Patient  MD ELOS: 2-3 weeks MinA Medical Rehab Prognosis:  Good Assessment: Brent [name] is a 55 year old man who is admitted to CIR with left side hemiparesis and dysarthria secondary to 2 adjacent acute infarctions in the right cingulate gyrus/colossal body(right ACA vascular territory) as well as infarct right parietal lobe and left frontal subcortical white matter infarct. Hx of prior stroke 6/22. Medications are being managed, and labs and vitals are being monitored regularly.     See Team Conference Notes for weekly updates to the plan of care

## 2020-09-30 NOTE — Progress Notes (Signed)
Physical Therapy Session Note  Patient Details  Name: Brent [name] MRN: [id number] Date of Birth: [birth year]  Today's Date: 09/30/2020 PT Individual Time: 1121-1203 PT Individual Time Calculation (min): 42 min   Short Term Goals: Week 1:  PT Short Term Goal 1 (Week 1): Pt will tolerate upright, OOB sitting for at least 1 hour between therapy sessions. PT Short Term Goal 2 (Week 1): Pt will perform sit<>stands with +2 total assist PT Short Term Goal 3 (Week 1): Pt will complete bed<>chair transfers with +2 total assist PT Short Term Goal 4 (Week 1): Pt will demonstrate improved sitting balance with ability to maintain static sitting for at least 2 minutes without UE support  Skilled Therapeutic Interventions/Progress Updates:  Patient supine in bed on entrance to room. Wrist restraints applied. Patient alert and agreeable to PT session. Very talkative throughout session and perseverates on wanting ice chips, food, and to let this therapist know what time his wife would be coming. Patient denied pain during session.  With any mention of mobility device, pt begins to focus on device and current need to use device. Perseverates on device until he is distracted with initiation of new task.   Therapeutic Activity: Bed Mobility: Wrist restraints removed at start of session. Patient performed supine <> sit with Min/ Mod A for LLE and bringing UB to upright seated position. Mild posterior bias that is corrected with BLE to floor for support. Pt requires Min/ Mod A for reciprocal forward scooting. VC/ tc required for technique and continued effort. Transfers: Patient performed STS transfers using STEDY with Mod A initially and improving to Min A with slightly elevated surface and allowing pt to perform at his speed which is slow. Provided verbal cues for hand placement, increased effort with LLE in extensor push to stand.  Neuromuscular Re-ed: NMR facilitated during session with focus on sitting  and standing balance and proprioception of midline orientation. Pt guided in sitting balance and resistance of pressures/ perturbations as well as questioning of postioning with pt able to correct. Forward reaching to improve forward lean in transfers. Standing balance challenged in STEDY with weight shifting, increasing shift to LLE with guard to L knee to prevent buckling. Attempt to start minisquats with pt moving to sit instead of returning to stand x2. NMR performed for improvements in motor control and coordination, balance, sequencing, judgement, and self confidence/ efficacy in performing all aspects of mobility at highest level of independence.    Patient supine  in bed at end of session with brakes locked, bed alarm set, wrist restraints donned, and all needs within reach.     Therapy Documentation Precautions:  Precautions Precautions: Fall, Other (comment) Precaution Comments: supplemental O2 via [place], L hemi, impaired safety awareness, impulsive Restrictions Weight Bearing Restrictions: No  Therapy/Group: Individual Therapy  Loel Dubonnet PT, DPT 09/30/2020, 11:29 AM

## 2020-09-30 NOTE — Progress Notes (Signed)
Physical Therapy Session Note  Patient Details  Name: Brent [name] MRN: [id number] Date of Birth: 08-29-1965  Today's Date: 09/30/2020 PT Individual Time: 1352-1430 PT Individual Time Calculation (min): 38 min   Short Term Goals: Week 1:  PT Short Term Goal 1 (Week 1): Pt will tolerate upright, OOB sitting for at least 1 hour between therapy sessions. PT Short Term Goal 2 (Week 1): Pt will perform sit<>stands with +2 total assist PT Short Term Goal 3 (Week 1): Pt will complete bed<>chair transfers with +2 total assist PT Short Term Goal 4 (Week 1): Pt will demonstrate improved sitting balance with ability to maintain static sitting for at least 2 minutes without UE support  Skilled Therapeutic Interventions/Progress Updates:  Pt received supine in bed w/wrist restraints donned, Telesitter on, pt requesting to use bathroom and reported 9/10 pain in low back. Nursing notified for +2 assistance and pain meds. Emphasis of session on transfers and sitting balance. With NT, removed wrist restraints and pt performed supine <>sit EOB w/2 helpers for trunk and LLE/UE management. Pt required mod-max A at EOB to maintain sitting balance and correct anterolateral lean to R. Pt made several inappropriate comments regarding therapist appearance and was requesting physical touch throughout session. Pt required heavy max A x2 for sit EOB <>Stedy and pt reported he no longer needed to use bathroom. Stedy <>EOB w/max A x2 and 2 helpers for sit EOB <> supine <> scoot HOB. Restraints to wrists reapplied and pt told therapist he was "going to play with himself" and began to touch his genitals inappropriately. Nursing arrived to administer pain meds and pt stopped. Pt was left supine in bed with all alarms set, restraints on and all needs in reach.   Therapy Documentation Precautions:  Precautions Precautions: Fall, Other (comment) Precaution Comments: supplemental O2 via [place], L hemi, impaired safety awareness,  impulsive Restrictions Weight Bearing Restrictions: No  Therapy/Group: Individual Therapy Brent [name], PT, DPT  09/30/2020, 7:49 AM

## 2020-09-30 NOTE — Progress Notes (Signed)
Inpatient Rehabilitation Center Individual Statement of Services  Patient Name:  Brent [name]  Date:  09/30/2020  Welcome to the Inpatient Rehabilitation Center.  Our goal is to provide you with an individualized program based on your diagnosis and situation, designed to meet your specific needs.  With this comprehensive rehabilitation program, you will be expected to participate in at least 3 hours of rehabilitation therapies Monday-Friday, with modified therapy programming on the weekends.  Your rehabilitation program will include the following services:  Physical Therapy (PT), Occupational Therapy (OT), Speech Therapy (ST), 24 hour per day rehabilitation nursing, Neuropsychology, Care Coordinator, Rehabilitation Medicine, Nutrition Services, and Pharmacy Services  Weekly team conferences will be held on Tuesday to discuss your progress.  Your Inpatient Rehabilitation Care Coordinator will talk with you frequently to get your input and to update you on team discussions.  Team conferences with you and your family in attendance may also be held.  Expected length of stay: 24-28 days  Overall anticipated outcome: mod assist level  Depending on your progress and recovery, your program may change. Your Inpatient Rehabilitation Care Coordinator will coordinate services and will keep you informed of any changes. Your Inpatient Rehabilitation Care Coordinator's name and contact numbers are listed  below.  The following services may also be recommended but are not provided by the Inpatient Rehabilitation Center:   Home Health Rehabiltiation Services Outpatient Rehabilitation Services    Arrangements will be made to provide these services after discharge if needed.  Arrangements include referral to agencies that provide these services.  Your insurance has been verified to be:  medicaid Your primary doctor is:  Nurse, children's  Pertinent information will be shared with your doctor and your  insurance company.  Inpatient Rehabilitation Care Coordinator:  Dossie Der, [place] [phone] or Luna [place]  Information discussed with and copy given to patient by: Lucy Chris, 09/30/2020, 12:17 PM

## 2020-09-30 NOTE — Progress Notes (Signed)
[place] PHYSICAL MEDICINE & REHABILITATION PROGRESS NOTE  Subjective/Complaints: PEG is coming loose- will obtain KUB to assess placement as he had been pulling it Restrains renewed for this reason Patient expresses that he would like restraints removed  ROS: Limited due to cognition.  Objective: Vital Signs: Blood pressure (!) 151/91, pulse 84, temperature 97.6 F (36.4 C), temperature source Oral, resp. rate 19, height 6' (1.829 m), weight 82.1 kg, SpO2 96 %. DG Abd 1 View  Result Date: 09/29/2020 CLINICAL DATA:  Peg tube placement. EXAM: ABDOMEN - 1 VIEW COMPARISON:  None. FINDINGS: An image was obtained after injection through the PEG tube. Contrast fills the distal stomach and the duodenum. No evidence of leak. IMPRESSION: Peg tube appears to be in good position with contrast in the distal stomach and duodenum. No leak identified. Electronically Signed   By: Gerome Sam III M.D.   On: 09/29/2020 12:43   Recent Labs    09/30/20 0710  WBC 12.6*  HGB 8.5*  HCT 26.2*  PLT 279   Recent Labs    09/30/20 0710  NA 149*  K 3.7  CL 116*  CO2 27  GLUCOSE 84  BUN 35*  CREATININE 1.16  CALCIUM 9.3    Intake/Output Summary (Last 24 hours) at 09/30/2020 1306 Last data filed at 09/29/2020 1827 Gross per 24 hour  Intake 0 ml  Output --  Net 0 ml        Physical Exam: BP (!) 151/91 (BP Location: Left Arm)   Pulse 84   Temp 97.6 F (36.4 C) (Oral)   Resp 19   Ht 6' (1.829 m)   Wt 82.1 kg   SpO2 96%   BMI 24.55 kg/m  Gen: no distress, normal appearing HEENT: oral mucosa pink and moist, NCAT Cardio: Reg rate Chest: normal effort, normal rate of breathing GI: Non-distended.  BS +.  + PEG. Skin: Warm and dry.  Intact. Psych: Impulsive.  Hyperactive. Musc: No edema in extremities.  No tenderness in extremities. Neuro: Alert Dysarthria, unchanged LUE: Shoulder abduction, elbow flexion/extension 1/5, handgrip 3/5 LLE: 1/5 proximal distal  Assessment/Plan: 1.  Functional deficits which require 3+ hours per day of interdisciplinary therapy in a comprehensive inpatient rehab setting. Physiatrist is providing close team supervision and 24 hour management of active medical problems listed below. Physiatrist and rehab team continue to assess barriers to discharge/monitor patient progress toward functional and medical goals   Care Tool:  Bathing    Body parts bathed by patient: Chest, Left arm, Right upper leg, Left upper leg, Face, Right lower leg, Abdomen, Front perineal area   Body parts bathed by helper: Right arm, Buttocks, Left lower leg     Bathing assist Assist Level: 2 Helpers     Upper Body Dressing/Undressing Upper body dressing   What is the patient wearing?: Pull over shirt    Upper body assist Assist Level: Dependent - Patient 0%    Lower Body Dressing/Undressing Lower body dressing      What is the patient wearing?: Pants, Incontinence brief     Lower body assist Assist for lower body dressing: 2 Helpers     Toileting Toileting    Toileting assist Assist for toileting: Dependent - Patient 0%     Transfers Chair/bed transfer  Transfers assist  Chair/bed transfer activity did not occur: Safety/medical concerns  Chair/bed transfer assist level: 2 Helpers     Locomotion Ambulation   Ambulation assist   Ambulation activity did not occur: Safety/medical concerns  Walk 10 feet activity   Assist  Walk 10 feet activity did not occur: Safety/medical concerns        Walk 50 feet activity   Assist Walk 50 feet with 2 turns activity did not occur: Safety/medical concerns         Walk 150 feet activity   Assist Walk 150 feet activity did not occur: Safety/medical concerns         Walk 10 feet on uneven surface  activity   Assist Walk 10 feet on uneven surfaces activity did not occur: Safety/medical concerns         Wheelchair     Assist Will patient use wheelchair at  discharge?: Yes Type of Wheelchair: Manual (per PT note) Wheelchair activity did not occur: Safety/medical concerns         Wheelchair 50 feet with 2 turns activity    Assist    Wheelchair 50 feet with 2 turns activity did not occur: Safety/medical concerns       Wheelchair 150 feet activity     Assist  Wheelchair 150 feet activity did not occur: Safety/medical concerns        Medical Problem List and Plan: 1.   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 Hx of prior stroke 6/22.  Continue CIR 2.  Impaired mobility: -DVT/anticoagulation:  Pharmaceutical: Lovenox             -antiplatelet therapy: Continue Aspirin 81 mg daily and Plavix 75 mg dailyx 3 weeks 3. Pain Management: d/c IV Dilaudid. Continue Hydrocodone as needed  Appears controlled on 8/15 4. Anxiety: Continue Xanax as needed              -antipsychotic agents: Seroquel 12.5 nightly started on 8/14 5. Neuropsych: This patient is not capable of making decisions on his 1.40 on 8/12 behalf.  Telemetry sitter for safety  Wrist restraints for safety 6. Skin/Wound Care: Routine skin checks 7. 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 -SLP follow up, advance diet as tolerated 9. Cocaine/tobacco abuse.UDS positive cocaine.  Counseled on appropriate 10.  Diabetes mellitus.  Hemoglobin A1c 6.4.  NovoLog 3 units every 4 hours and Levemir 30 units twice daily.  Diabetic teaching  Labile on 8/14, Levemir decreased to 25 twice daily on 8/14, continue current dose 11.  Hyperlipidemia.  Crestor/fenofibrate 12.Aspiration pneumonia.  Completed 14 days antibiotic 13.  Constipation.  MiraLAX 14. Mild anemia   Hemoglobin  11.4 on 8/12, continue to monitor 15.  Leukocytosis  WBCs 22.9 on 8/12, continue to monitor 16.  AKI  Creatinine 1.40 on 8/12, labs ordered for tomorrow  IVF initiated 17. Hypernatremia: IVF increased to 173mls/hr. Repeat BMP tomorrow  LOS: 3 days A FACE TO FACE EVALUATION WAS PERFORMED  Clint Bolder P Aprille Sawhney 09/30/2020, 1:06 PM

## 2020-10-01 ENCOUNTER — Inpatient Hospital Stay (HOSPITAL_COMMUNITY): Payer: Medicaid Other

## 2020-10-01 LAB — COMPREHENSIVE METABOLIC PANEL
ALT: 14 U/L (ref 0–44)
AST: 16 U/L (ref 15–41)
Albumin: 2.8 g/dL — ABNORMAL LOW (ref 3.5–5.0)
Alkaline Phosphatase: 52 U/L (ref 38–126)
Anion gap: 6 (ref 5–15)
BUN: 16 mg/dL (ref 6–20)
CO2: 23 mmol/L (ref 22–32)
Calcium: 8.8 mg/dL — ABNORMAL LOW (ref 8.9–10.3)
Chloride: 110 mmol/L (ref 98–111)
Creatinine, Ser: 0.81 mg/dL (ref 0.61–1.24)
GFR, Estimated: >60 mL/min (ref >60)
Glucose, Bld: 159 mg/dL — ABNORMAL HIGH (ref 70–99)
Potassium: 3.8 mmol/L (ref 3.5–5.1)
Sodium: 139 mmol/L (ref 135–145)
Total Bilirubin: 0.4 mg/dL (ref 0.3–1.2)
Total Protein: 5.6 g/dL — ABNORMAL LOW (ref 6.5–8.1)

## 2020-10-01 LAB — GLUCOSE, CAPILLARY
Glucose-Capillary: 101 mg/dL — ABNORMAL HIGH (ref 70–99)
Glucose-Capillary: 113 mg/dL — ABNORMAL HIGH (ref 70–99)
Glucose-Capillary: 129 mg/dL — ABNORMAL HIGH (ref 70–99)
Glucose-Capillary: 156 mg/dL — ABNORMAL HIGH (ref 70–99)
Glucose-Capillary: 225 mg/dL — ABNORMAL HIGH (ref 70–99)
Glucose-Capillary: 51 mg/dL — ABNORMAL LOW (ref 70–99)
Glucose-Capillary: 67 mg/dL — ABNORMAL LOW (ref 70–99)
Glucose-Capillary: 87 mg/dL (ref 70–99)

## 2020-10-01 LAB — CBC
HCT: 25 % — ABNORMAL LOW (ref 39.0–52.0)
Hemoglobin: 8.3 g/dL — ABNORMAL LOW (ref 13.0–17.0)
MCH: 32.3 pg (ref 26.0–34.0)
MCHC: 33.2 g/dL (ref 30.0–36.0)
MCV: 97.3 fL (ref 80.0–100.0)
Platelets: 280 10*3/uL (ref 150–400)
RBC: 2.57 MIL/uL — ABNORMAL LOW (ref 4.22–5.81)
RDW: 14.7 % (ref 11.5–15.5)
WBC: 12.1 10*3/uL — ABNORMAL HIGH (ref 4.0–10.5)
nRBC: 0 % (ref 0.0–0.2)

## 2020-10-01 LAB — BASIC METABOLIC PANEL
Anion gap: 4 — ABNORMAL LOW (ref 5–15)
BUN: 21 mg/dL — ABNORMAL HIGH (ref 6–20)
CO2: 25 mmol/L (ref 22–32)
Calcium: 8.9 mg/dL (ref 8.9–10.3)
Chloride: 116 mmol/L — ABNORMAL HIGH (ref 98–111)
Creatinine, Ser: 0.88 mg/dL (ref 0.61–1.24)
GFR, Estimated: >60 mL/min (ref >60)
Glucose, Bld: 88 mg/dL (ref 70–99)
Potassium: 3.6 mmol/L (ref 3.5–5.1)
Sodium: 145 mmol/L (ref 135–145)

## 2020-10-01 IMAGING — DX DG ABDOMEN 1V
1 series · 2 of 2 positions shown · non-contrast
Comparison: Gastrostomy tube placement exam yesterday.

CLINICAL DATA: Feeding tube placement.

EXAM:
ABDOMEN - 1 VIEW

[Series 1: abdomen · 0.14mm/px · 2 of 2 slices shown]
[im 1/2]
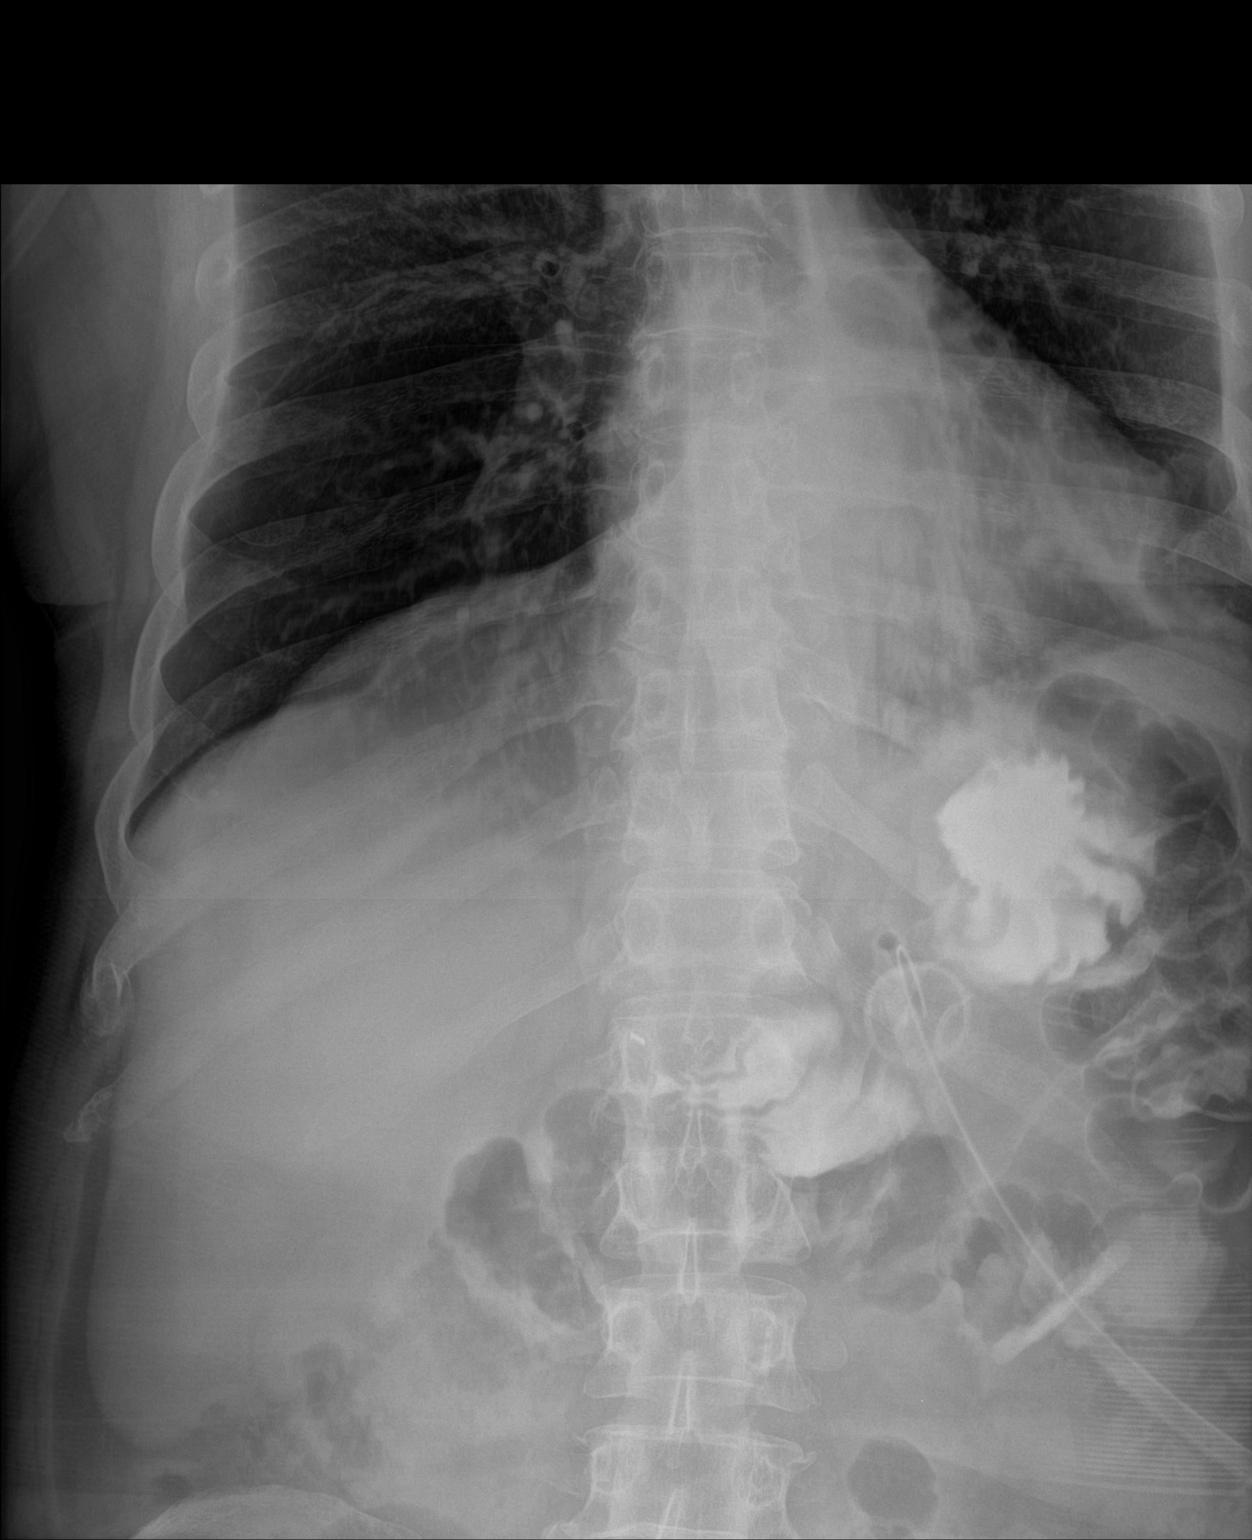
[im 2/2]
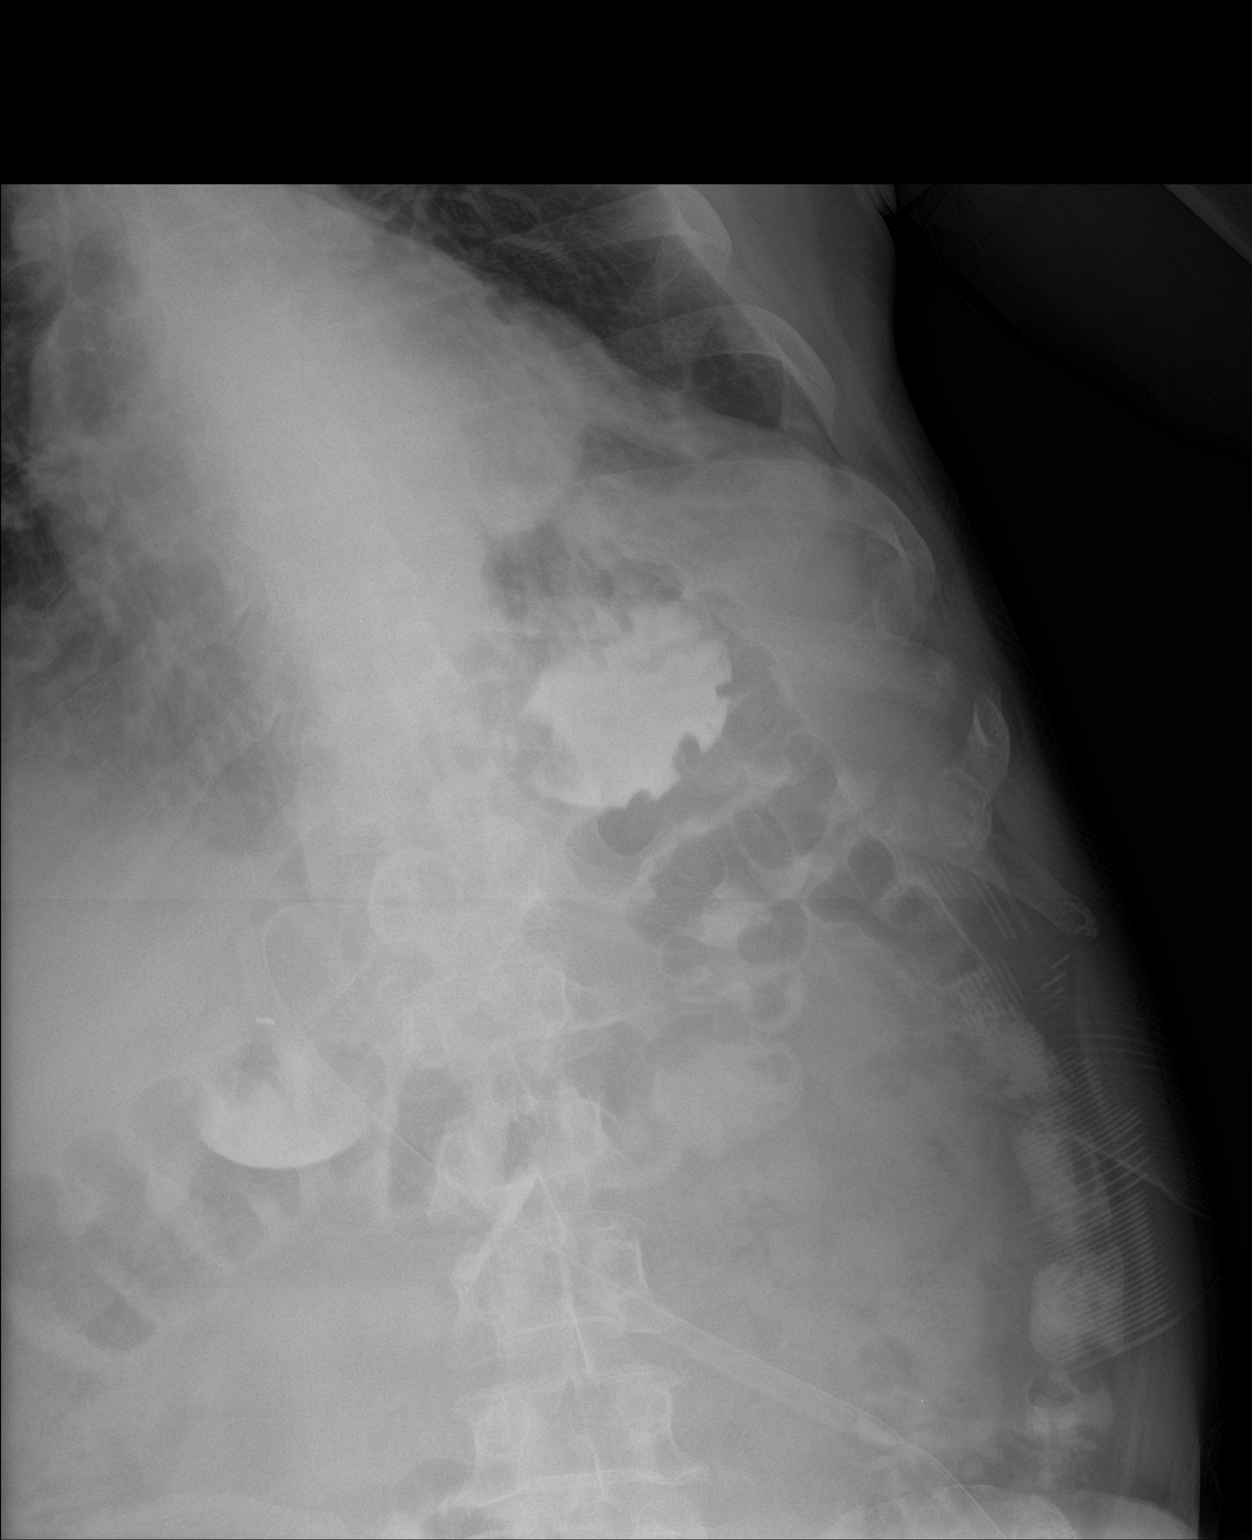

[2 of 2 positions shown; findings below may reference images not displayed]

FINDINGS: Portable AP supine view of the abdomen obtained after the
installation of 30 cc via indwelling gastrostomy tube. Gastrostomy
tube balloon projects over the left upper quadrant. Contrast
opacifies the stomach. There is no evidence of extravasation or
leak. Again seen contrast in the colon from additional recent
gastrostomy evaluations. Contrast seen left laterally on yesterday's
exam is not included in the field of view.
IMPRESSION: 1. Gastrostomy tube in the stomach. No evidence of extravasation or
leak.
2. Contrast in the colon from recent gastrostomy evaluations.

## 2020-10-01 IMAGING — DX DG CHEST 1V PORT
1 series · 1 of 1 positions shown · non-contrast
Comparison: [DATE]

CLINICAL DATA: Acute respiratory distress

EXAM:
PORTABLE CHEST 1 VIEW

[chest]
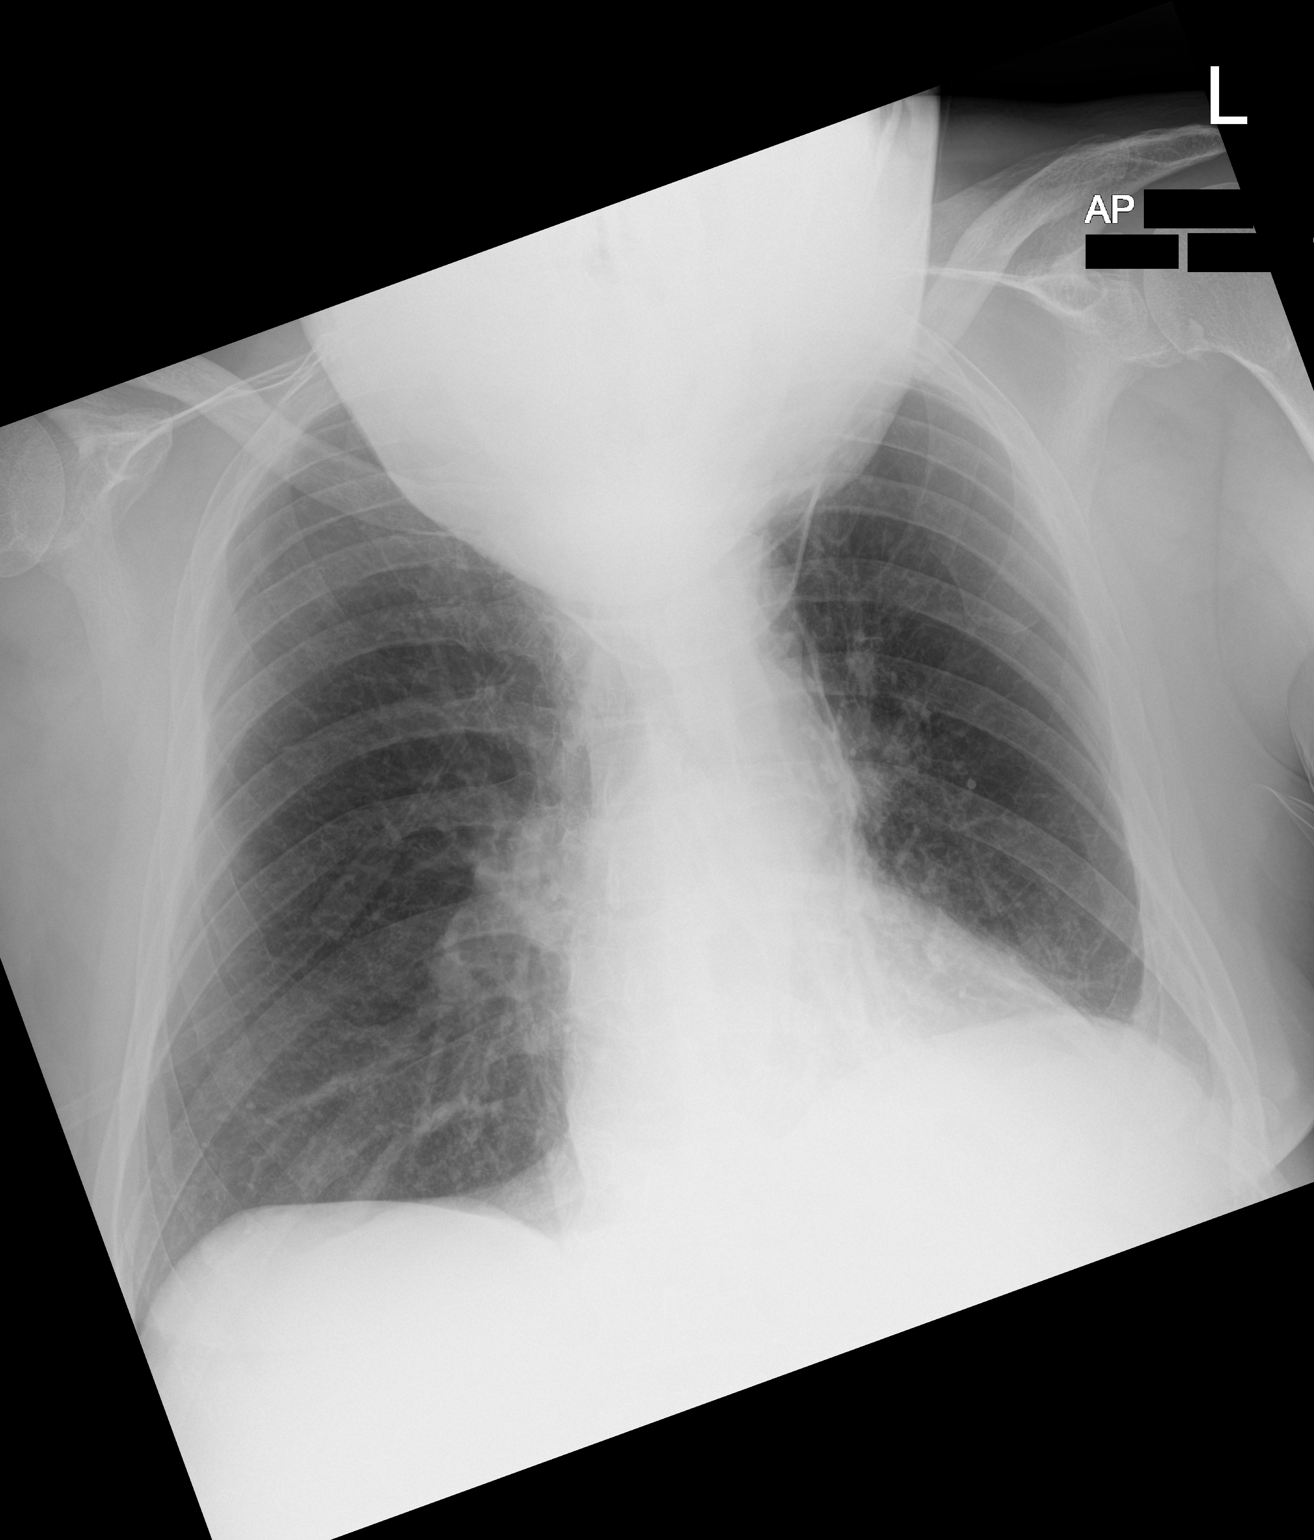

[1 of 1 positions shown; findings below may reference images not displayed]

FINDINGS: Cardiac shadow is within normal limits. Feeding catheter has been
removed. The lungs are well aerated bilaterally. Previously seen
left-sided airspace opacities have resolved in the interval. No new
focal abnormality is noted.
IMPRESSION: No acute abnormality noted.

## 2020-10-01 MED ORDER — HYDROCODONE-ACETAMINOPHEN 5-325 MG PO TABS
1.0000 | ORAL_TABLET | ORAL | Status: DC | PRN
  Administered 2020-10-01 – 2020-10-03 (×3): 1
  Filled 2020-10-01 (×3): qty 1

## 2020-10-01 MED ORDER — DEXTROSE 50 % IV SOLN
25.0000 g | INTRAVENOUS | Status: AC
  Administered 2020-10-01: 25 g via INTRAVENOUS

## 2020-10-01 MED ORDER — DIATRIZOATE MEGLUMINE & SODIUM 66-10 % PO SOLN
ORAL | Status: AC
  Administered 2020-10-01: 30 mL
  Filled 2020-10-01: qty 30

## 2020-10-01 MED ORDER — INSULIN ASPART 100 UNIT/ML IJ SOLN
2.0000 [IU] | INTRAMUSCULAR | Status: DC
  Administered 2020-10-01 – 2020-10-02 (×4): 2 [IU] via SUBCUTANEOUS

## 2020-10-01 MED ORDER — INSULIN DETEMIR 100 UNIT/ML ~~LOC~~ SOLN
12.0000 [IU] | Freq: Two times a day (BID) | SUBCUTANEOUS | Status: DC
  Administered 2020-10-01: 12 [IU] via SUBCUTANEOUS
  Filled 2020-10-01 (×3): qty 0.12

## 2020-10-01 MED ORDER — DIATRIZOATE MEGLUMINE & SODIUM 66-10 % PO SOLN: 30.0000 mL | Freq: Once | ORAL | Status: DC

## 2020-10-01 MED ORDER — QUETIAPINE FUMARATE 25 MG PO TABS
25.0000 mg | ORAL_TABLET | Freq: Every day | ORAL | Status: DC
  Administered 2020-10-01 – 2020-10-14 (×14): 25 mg
  Filled 2020-10-01 (×15): qty 1

## 2020-10-01 MED ORDER — DEXTROSE 50 % IV SOLN
INTRAVENOUS | Status: AC
  Filled 2020-10-01: qty 50

## 2020-10-01 MED ORDER — PROPRANOLOL HCL 10 MG PO TABS
10.0000 mg | ORAL_TABLET | Freq: Every day | ORAL | Status: DC
  Administered 2020-10-01 – 2020-10-11 (×10): 10 mg via ORAL
  Filled 2020-10-01 (×10): qty 1

## 2020-10-01 NOTE — Significant Event (Addendum)
Answering patient light regarding toileting needs at 19:00. While changing his brief he started to become congested and unable to clear his throat. I sat patient head up at 64 degrees and offered suctioning. Rapid response and respiratory called as patient breathing became more rapid. Dan notified.

## 2020-10-01 NOTE — Progress Notes (Signed)
Contacted by RN in regards to patient having some respiratory distress oxygen saturations maintained 98% improved with breathing treatment 95% on 3 L oxygen.  There has been reports today of a few loose stools and latest hemoglobin 8.5.Marland Kitchen  Chest x-ray completed showing no acute abnormality however noting feeding catheter had been removed and a KUB noted yesterday to being in place.  Contact made to hospitalist Dr. Margo Aye awaiting results of stat CBC and repeat KUB to confirm PEG tube placement.

## 2020-10-01 NOTE — Progress Notes (Signed)
Speech Language Pathology Daily Session Note  Patient Details  Name: Brent [name] MRN: [id number] Date of Birth: 07-Mar-1965  Today's Date: 10/01/2020 SLP Individual Time: 1455-1540 SLP Individual Time Calculation (min): 45 min  Short Term Goals: Week 1: SLP Short Term Goal 1 (Week 1): Pt will consume trial of ice chips with s/s aspiration <50% of trials with mod cues for swallow strategies SLP Short Term Goal 2 (Week 1): Pt will recall functional information with 75% accuracy mod A cues SLP Short Term Goal 3 (Week 1): Pt will utilize compensatory strategies for speech intelligibility with mod A cues SLP Short Term Goal 4 (Week 1): Pt will ID 3-5 functional changes since CVA with mod A cues SLP Short Term Goal 5 (Week 1): Pt will complete simple problem solving tasks for daily routine with 75% accuracy mod A cues SLP Short Term Goal 6 (Week 1): Pt will increase sustained attention to 3 minutes provided mod A cues  Skilled Therapeutic Interventions:Skilled ST services focused on swallow and cognitive skills. SLP set up oral care via suction toothbrush pt required supervision A verbal cues for thoroughness. Pt consumed very small ice chips presented by SLP resulting in piecemeal swallow and only 1 out 5 trials did pt demonstrate no overt s/s aspiration, remainder trials pt demonstrated delayed and immediate coughs that's appeared ineffective in expelling secretions. Pt's vocal quality also appeared wet intermittently and demonstrated ineffective cough to clear likely aspiration of secretions, despite verbal cues to cough hard and re-swallow or suction secretions. Pt demonstrates impairment in awareness after these episodes stating "there is nothing in my throat" while his voice currently sounds wet. Pt demonstrated improved swallow function when consuming trials of pureed textures, pt only demonstrated x1 immediate cough when attempting to verbalize, the remainder of trial swallow appeared timely,  possible reduce hyoid -laryngeal excursion noted and pt was able to clear oral cavity. Pt was also able to follow directions to cease verbalization with min A verbal cues after initial cue. SLP taught pt effortful swallow strategy during pureed textured trial, but required max A verbal cues to implement consistently. SLP will plan repeat MBS for this week to assess PO tolerance and placed official water protocol order for comfort with minimal amounts of ice chips. Pt is constantly requesting ice chips from staff and respiratory status has remained relatively the same since last MBS recommend of comfort feeding of ice chips. Pt was able to return demonstration of call bell. Pt was left in room with call bell within reach and bed alarm set. SLP recommends to continue skilled services.Pt demonstrated poor recall, pressing call bell at least 3 time after SLP left to request applesauce even after SLP reentered and explained puree trials are only with ST services.      Pain Pain Assessment Pain Scale: 0-10 Pain Score: 0-No pain Pain Type: Chronic pain Pain Location: Back Pain Orientation: Lower Pain Descriptors / Indicators: Aching;Throbbing Pain Frequency: Constant Pain Onset: On-going Pain Intervention(s): Medication (See eMAR)  Therapy/Group: Individual Therapy  Silver Parkey  [facility] 10/01/2020, 4:02 PM

## 2020-10-01 NOTE — Progress Notes (Signed)
Patient ID: CLARION MOONEYHAN, male   DOB: Nov 09, 1965, 55 y.o.   MRN: [id number] Met with the patient to introduce self and review role of the nurse CM. Reviewed current medications and nutritional means. Patient with oxygen and also asked for a cigarette but reported he was done with drug use. Poor recall of past medical history and noted he was not aware of all of his medications, patient noted girlfriend managed medications PTA and fixed meals. Patient thoughts all over the place but able to note she will need to manage them at discharge as well. Patient reported toileting needs continent of bowel and bladder with urgency however attempted to gain access to sink for water by requesting assistance to the sink to wash his hands. Reported no understanding of rationale for not allowing him to have oral fluids with PEG and TF/IV then asked for ice chips if he cannot have H2 O. Per nursing, PEG site looks good. Remains in tilt in space chair with seat belt and bil wrist restraints. Telesitter at bedside. Continue to follow along to discharge to address educational needs with patient and significant other. .me

## 2020-10-01 NOTE — Progress Notes (Signed)
RT note. RT attempt to NTS patient, able to pass cath down and was not able to suction anything up. Did see patient cough a moderate amount of clear secretions up and did swallow all of it after. Patient currently on 3L [place] sat 95%. Labored breathing noted, RT will continue to monitor.

## 2020-10-01 NOTE — Progress Notes (Signed)
Brent [name] PHYSICAL MEDICINE & REHABILITATION PROGRESS NOTE  Subjective/Complaints: Continues to be agitated, hypersexual at times Having trouble following commands with therapy Sleeping poorly Need to establish family support today  ROS: Limited due to cognition.  Objective: Vital Signs: Blood pressure (!) 175/82, pulse 85, temperature 97.6 F (36.4 C), temperature source Oral, resp. rate 20, height 6' (1.829 m), weight 81.4 kg, SpO2 95 %. DG Abd 1 View  Result Date: 09/30/2020 CLINICAL DATA:  Peg placement. EXAM: ABDOMEN - 1 VIEW COMPARISON:  Radiograph yesterday. FINDINGS: Single frontal view of the abdomen obtained. Gastrostomy tube projects over the left upper quadrant in the region of the stomach. There is contrast opacifying the gastric lumen. Type and amount of contrast is not specified. There is no evidence of extravasation or leak. Contrast is visualized about the left lateral abdomen but is likely external to the patient, and is lateral to the colon only partially included in the field of view. There is contrast in the colon from yesterday's exam. IMPRESSION: 1. Gastrostomy tube in the stomach with contrast opacifying the gastric lumen. No evidence of extravasation or leak. 2. Contrast about the left lateral abdomen is likely external to the patient, and only partially included in the field of view. 3. There is contrast within the left colon from yesterday's exam. Electronically Signed   By: Narda Rutherford M.D.   On: 09/30/2020 15:31   DG Abd 1 View  Result Date: 09/29/2020 CLINICAL DATA:  Peg tube placement. EXAM: ABDOMEN - 1 VIEW COMPARISON:  None. FINDINGS: An image was obtained after injection through the PEG tube. Contrast fills the distal stomach and the duodenum. No evidence of leak. IMPRESSION: Peg tube appears to be in good position with contrast in the distal stomach and duodenum. No leak identified. Electronically Signed   By: Gerome Sam III M.D.   On: 09/29/2020  12:43   Recent Labs    09/30/20 0710  WBC 12.6*  HGB 8.5*  HCT 26.2*  PLT 279   Recent Labs    09/30/20 0710 10/01/20 0531  NA 149* 145  K 3.7 3.6  CL 116* 116*  CO2 27 25  GLUCOSE 84 88  BUN 35* 21*  CREATININE 1.16 0.88  CALCIUM 9.3 8.9   No intake or output data in the 24 hours ending 10/01/20 1102       Physical Exam: BP (!) 175/82 (BP Location: Left Arm)   Pulse 85   Temp 97.6 F (36.4 C) (Oral)   Resp 20   Ht 6' (1.829 m)   Wt 81.4 kg   SpO2 95%   BMI 24.34 kg/m  Gen: no distress, normal appearing HEENT: oral mucosa pink and moist, NCAT Cardio: Reg rate Chest: normal effort, normal rate of breathing GI: Non-distended.  BS +.  + PEG. Skin: Warm and dry.  Intact. Psych: Impulsive.  Hyperactive. Wrist restraints in place.  Musc: No edema in extremities.  No tenderness in extremities. Neuro: Alert Dysarthria, unchanged LUE: Shoulder abduction, elbow flexion/extension 1/5, handgrip 3/5 LLE: 1/5 proximal distal  Assessment/Plan: 1. Functional deficits which require 3+ hours per day of interdisciplinary therapy in a comprehensive inpatient rehab setting. Physiatrist is providing close team supervision and 24 hour management of active medical problems listed below. Physiatrist and rehab team continue to assess barriers to discharge/monitor patient progress toward functional and medical goals   Care Tool:  Bathing    Body parts bathed by patient: Chest, Left arm, Right upper leg, Left upper leg, Face,  Right lower leg, Abdomen, Front perineal area   Body parts bathed by helper: Right arm, Buttocks, Left lower leg     Bathing assist Assist Level: 2 Helpers     Upper Body Dressing/Undressing Upper body dressing   What is the patient wearing?: Pull over shirt    Upper body assist Assist Level: Dependent - Patient 0%    Lower Body Dressing/Undressing Lower body dressing      What is the patient wearing?: Pants, Incontinence brief     Lower  body assist Assist for lower body dressing: 2 Helpers     Toileting Toileting    Toileting assist Assist for toileting: Dependent - Patient 0%     Transfers Chair/bed transfer  Transfers assist  Chair/bed transfer activity did not occur: Safety/medical concerns  Chair/bed transfer assist level: 2 Helpers     Locomotion Ambulation   Ambulation assist   Ambulation activity did not occur: Safety/medical concerns          Walk 10 feet activity   Assist  Walk 10 feet activity did not occur: Safety/medical concerns        Walk 50 feet activity   Assist Walk 50 feet with 2 turns activity did not occur: Safety/medical concerns         Walk 150 feet activity   Assist Walk 150 feet activity did not occur: Safety/medical concerns         Walk 10 feet on uneven surface  activity   Assist Walk 10 feet on uneven surfaces activity did not occur: Safety/medical concerns         Wheelchair     Assist Will patient use wheelchair at discharge?: Yes Type of Wheelchair: Manual (per PT note) Wheelchair activity did not occur: Safety/medical concerns         Wheelchair 50 feet with 2 turns activity    Assist    Wheelchair 50 feet with 2 turns activity did not occur: Safety/medical concerns       Wheelchair 150 feet activity     Assist  Wheelchair 150 feet activity did not occur: Safety/medical concerns        Medical Problem List and Plan: 1.   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 Hx of prior stroke 6/22.  Continue CIR  Need to establish family support at home 2.  Impaired mobility: Continue Lovenox             -antiplatelet therapy: Continue Aspirin 81 mg daily and Plavix 75 mg dailyx 3 weeks 3. Pain: d/c IV Dilaudid. Decrease  Hydrocodone to 1 tablet q4H needed  Appears controlled on 8/16 4. Anxiety: Continue Xanax as needed. Increased seroquel to 25mg  HS 5. Neuropsych: This patient is not capable of making decisions on his 1.40 on 8/12 behalf.  Telemetry sitter for safety  Wrist restraints for safety 6. Skin/Wound Care: Routine skin checks 7. 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 -SLP follow up, advance diet as tolerated 9. Cocaine/tobacco abuse.UDS positive cocaine.  Counseled on appropriate 10.  Diabetes mellitus.  Hemoglobin A1c 6.4.  NovoLog 3 units every 4 hours and Levemir 30 units twice daily.  Diabetic teaching  Labile on 8/14, Levemir decreased to 25 twice daily on 8/14, continue current dose 11.  Hyperlipidemia.  Crestor/fenofibrate 12.Aspiration pneumonia.  Completed 14 days antibiotic 13.  Constipation.  MiraLAX 14. Mild anemia   Hemoglobin 11.4 on 8/12, continue to monitor 15.  Leukocytosis  WBCs 22.9 on 8/12, continue to monitor 16.  AKI  Creatinine 1.40 on 8/12, labs ordered for tomorrow  IVF initiated 17. Hypernatremia: IVF increased to 164mls/hr. Repeat BMP tomorrow 18. HTN: start propanolol 10mg  daily.  LOS: 4 days A FACE TO FACE EVALUATION WAS PERFORMED  Tessia Kassin P Ethanjames Fontenot 10/01/2020, 11:02 AM

## 2020-10-01 NOTE — Progress Notes (Signed)
Physical Therapy Session Note  Patient Details  Name: Brent [name] MRN: [id number] Date of Birth: 30-Oct-1965  Today's Date: 10/01/2020 PT Individual Time: 4383-8184 PT Individual Time Calculation (min): 42 min  and Today's Date: 10/01/2020 PT Missed Time: 18 Minutes Missed Time Reason: Increased agitation;Patient unwilling to participate;Patient fatigue  Short Term Goals: Week 1:  PT Short Term Goal 1 (Week 1): Pt will tolerate upright, OOB sitting for at least 1 hour between therapy sessions. PT Short Term Goal 2 (Week 1): Pt will perform sit<>stands with +2 total assist PT Short Term Goal 3 (Week 1): Pt will complete bed<>chair transfers with +2 total assist PT Short Term Goal 4 (Week 1): Pt will demonstrate improved sitting balance with ability to maintain static sitting for at least 2 minutes without UE support  Skilled Therapeutic Interventions/Progress Updates:  Pt received sitting in tilt in space WC in room, very agitated upon arrival and requested to go back to bed. Attempted to encourage pt to participate in therapy, pt refused and perseverated on not being able to eat and not caring about whether he "lived or died". Provided encouragement and emotional support, pt unwilling to listen and repeatedly told therapist she was annoying him. While setting up transfer to bed, pt attempted to get up several times, unresponsive to therapist's cues, requiring min A for guarding in chair to avoid rolling out. Sit <>stand pivot from WC to EOB w/mod A x2 for added safety, heavy verbal cues for sequencing and attention to task. Pt demonstrated significant anterolateral lean to R side  requiting min-mod A for correction and positioning of L foot. Once in bed, encouraged pt to perform FIST at EOB, but pt refused and became more agitated. Sit <>supine w/min A for LLE management, heavy verbal cues for hand placement and stern instruction to perform movement as he refused to participate in bed mobility  and stated "he was not going to help", but later complied. In supine, pt soiled his briefs incontinently, as pt refused to use urinal. Multiple rolls L & R w/min A to change briefs and clean pt, requiring heavy verbal cues to reach for rail rather than therapist and to avoid rolling in urine. Pt required max A to doff pants/shirt and to donn hospital gown. Pt performed supine <>ring sit w/supervision and held for 5 minutes without UE support while therapist donned hospital gown. Sit <>supine <>L sidelying w/supervision and pt laid on L side for 10 minutes for proprioceptive input and to calm pt down. Pt very adamant that he needed rest and would not participate any longer, became very agitated with any encouragement to continue therapy. Pt was left supine in bed, restraints on and all needs in reach.   Therapy Documentation Precautions:  Precautions Precautions: Fall, Other (comment) Precaution Comments: supplemental O2 via [place], L hemi, impaired safety awareness, impulsive, NPO Restrictions Weight Bearing Restrictions: No   Therapy/Group: Individual Therapy Jill Alexanders Maudine Kluesner, PT, DPT  10/01/2020, 7:48 AM

## 2020-10-01 NOTE — Progress Notes (Signed)
Physical Therapy Session Note  Patient Details  Name: Brent [name] MRN: [id number] Date of Birth: 05-12-1965  Today's Date: 10/01/2020 PT Individual Time: 1335-1400 PT Individual Time Calculation (min): 25 min   Short Term Goals: Week 1:  PT Short Term Goal 1 (Week 1): Pt will tolerate upright, OOB sitting for at least 1 hour between therapy sessions. PT Short Term Goal 2 (Week 1): Pt will perform sit<>stands with +2 total assist PT Short Term Goal 3 (Week 1): Pt will complete bed<>chair transfers with +2 total assist PT Short Term Goal 4 (Week 1): Pt will demonstrate improved sitting balance with ability to maintain static sitting for at least 2 minutes without UE support  Skilled Therapeutic Interventions/Progress Updates: Pt presents supine in bed and agreeable to therapy.  Pt missed x 5 minutes 2/2 nursing care and so trreated next pt and returned for completion.  Pt required max A for rolling to Right and then to sitting.  Pt sat EOB and required continual verbal cues for posture/balance as moved Stedy to side of bed.  Pt required max to mod A for sit to stand w/ B UEs on Stedy bar.  Pt stood to finish pulling pants over hips w/ Total A.  Pt practiced sit to stand from [employer] w/ mod A and stood x 30 seconds.  Pt performed reverse log roll sit to supine w/ max A and verbal cues.  Wrist restraints donned and bed alarm on.     Therapy Documentation Precautions:  Precautions Precautions: Fall, Other (comment) Precaution Comments: supplemental O2 via [place], L hemi, impaired safety awareness, impulsive, NPO Restrictions Weight Bearing Restrictions: No General: PT Amount of Missed Time (min): 18 Minutes PT Missed Treatment Reason: Increased agitation;Patient unwilling to participate;Patient fatigue Vital Signs: Therapy Vitals Temp: 98.6 F (37 C) Temp Source: Oral Pulse Rate: 74 Resp: 16 BP: 140/84 Patient Position (if appropriate): Lying Oxygen Therapy SpO2: 100 % O2  Device: Nasal Cannula Pain:no c/o.       Therapy/Group: Individual Therapy  Lucio Edward 10/01/2020, 3:38 PM

## 2020-10-01 NOTE — Patient Care Conference (Signed)
Inpatient RehabilitationTeam Conference and Plan of Care Update Date: 10/01/2020   Time: 12:04 PM    Patient Name: Brent [name]      Medical Record Number: [id number]  Date of Birth: 06-19-1965 Sex: Male         Room/Bed: 4W21C/4W21C-01 Payor Info: Payor: [place] MEDICAID PREPAID HEALTH PLAN / Plan: [place] MEDICAID [employer] [place] / Product Type: *No Product type* /    Admit Date/Time:  09/27/2020  1:13 PM  Primary Diagnosis:  CVA (cerebral vascular accident) [facility])  Hospital Problems: Principal Problem:   CVA (cerebral vascular accident) (HCC) Active Problems:   Labile blood glucose   Leukocytosis   AKI (acute kidney injury) (HCC)   S/P percutaneous endoscopic gastrostomy (PEG) tube placement (HCC)   Malnutrition of moderate degree    Expected Discharge Date: Expected Discharge Date: 10/25/20  Team Members Present: Physician leading conference: Dr. Sula Soda Social Worker Present: Dossie Der, LCSW Nurse Present: Chana Bode, RN PT Present: Ernestene Kiel, PT OT Present: Ezra Sites, OT SLP Present: Feliberto Gottron, SLP PPS Coordinator present : Fae Pippin, SLP     Current Status/Progress Goal Weekly Team Focus  Bowel/Bladder             Swallow/Nutrition/ Hydration   NPO - mod-to-max A for swallow safety  Min A  Ice chips trials, oral care, further education on NPO status, swallow precautions, safety (limited insight and understanding for NPO status)   ADL's   Max A for UB ADLs; total A +2 for LB ADLs sit<>stand.  Total A +2 squat pivot transfers  set up-min A UB ADLs; mod A LB ADLs sit<>stand level; transfers min-mod A  ADL retraining; LUE NMR; functional transfer training; energy conservation, safety and fall prevention; functional cognition   Mobility   2 helpers for all mobility  Min A  Transfers, pre-gait training, safety awareness, dynamic sitting balance   Communication             Safety/Cognition/ Behavioral Observations  max A - patient  presenting with severe cognitive deficits presenting in difficulty in all tested areas including decreased orientation, attention, immediate/short-term recall, problem solving, and decreased insight into deficits. Patient is known to be impulsive and has made sexually inappropriate comments  sup for orientation, min A for problem solving, memory, attention, and awareness  orientation, attention, working memory, safety awareness   Pain             Skin               Discharge Planning:  Girlfriend wants to take home but wants to be able to manage, she may be returning to work. Need to confirm discharge plan as pt hopefully progresses   Team Discussion: Patient with PEG trauma; KUB noted good position. Hypoglycemic with TF; MD adjusting medications and monitoring HTN. Patient still requiring oxygen. Patient on target to meet rehab goals: Currently with telesitter and bil wrist restraints due to risk for pulling out tubes/drains. Patient requires min assist for transfers and max assist for bathing. Able to stand using a stedy with cues and flexed knees. Will need help to complete tube feedings and PEG care.   *See Care Plan and progress notes for long and short-term goals.   Revisions to Treatment Plan:  Working on maintaining attention, safety awareness, and recall.  Teaching Needs: Safety, PEG care, nutritional means, medications, transfers, etc.  Current Barriers to Discharge: Decreased caregiver support, Home enviroment access/layout, Incontinence, Insurance for SNF coverage, Behavior,  Nutritional means, and New oxygen  Possible Resolutions to Barriers: Family education     Medical Summary Current Status: insomnia, back pain, hypoxia, agitation, neurogenic bowel and bladder, pulling out his PEG at times, hypersexual behavior, HTN  Barriers to Discharge: Medical stability;Wound care;Nutrition means;Neurogenic Bowel & Bladder;Behavior  Barriers to Discharge Comments: insomnia, back  pain, hypoxia, agitation, neurogenic bowel and bladder, pulling out his PEG at times, hypersexual behavior, HTN Possible Resolutions to [employer]: continue prn pain medication, start propanolol for hypertension and agitation, continue bowel and bladder program, continue to monitor PEG wound daily   Continued Need for Acute Rehabilitation Level of Care: The patient requires daily medical management by a physician with specialized training in physical medicine and rehabilitation for the following reasons: Direction of a multidisciplinary physical rehabilitation program to maximize functional independence : Yes Medical management of patient stability for increased activity during participation in an intensive rehabilitation regime.: Yes Analysis of laboratory values and/or radiology reports with any subsequent need for medication adjustment and/or medical intervention. : Yes   I attest that I was present, lead the team conference, and concur with the assessment and plan of the team.   Chana Bode B 10/01/2020, 3:30 PM

## 2020-10-01 NOTE — Significant Event (Signed)
Hypoglycemic Event  CBG: 67  Treatment: 4 oz juice/soda  Symptoms: None  Follow-up CBG: Time:8:59 CBG Result:113  Possible Reasons for Event: Inadequate meal intake  Comments/MD notified:Protocol followed

## 2020-10-01 NOTE — Progress Notes (Signed)
Hypoglycemic Event  CBG: 51  Treatment: D50 50 mL (25 gm)  Symptoms: Sweaty  Follow-up CBG: Time:0500 CBG Result:101  Possible Reasons for Event: Unknown  Comments/MD notified:Hypoglycemic order set per protocol    Thereasa Parkin

## 2020-10-01 NOTE — Significant Event (Signed)
Rapid Response Event Note   Reason for Call : Respiratory Distress Initial Focused Assessment:  Nursing staff notified me regarding Mr. Maue's SOB/respiratory distress. Upon arrival, Mr Canal is alert and endorsing SOB and CP. Labored breathing with accessory muscle use including paradoxical abdominal  Movement. Productive cough. Pt would not spit phlegm and swallowed it. NTS performed with minimal secretions. Reportedly pt has dark tarry stools earlier today. CBC ordered. Duoneb administered. Pt's WOB improved after the duoneb.  EKG performed and NSR with no STE.   1950- Afebrile, HR 88, 128/73 (90), RR 25 with sats 95% on 3L [place].   TRH consulted for medical management.   Interventions:  -Stat PCXR -Stat EKG( SR with no SLE) -CBC Stat    MD Notified: Harvel Ricks PA [phone]) Call Time: 1921 Arrival Time: 1927 End Time:   Rose Fillers, RN

## 2020-10-01 NOTE — Progress Notes (Signed)
Lab present to draw labs

## 2020-10-01 NOTE — Progress Notes (Signed)
Occupational Therapy Session Note  Patient Details  Name: Brent [name] MRN: [id number] Date of Birth: October 02, 1965  Today's Date: 10/01/2020 OT Individual Time: 0906-1005 OT Individual Time Calculation (min): 59 min    Short Term Goals: Week 1:  OT Short Term Goal 1 (Week 1): Pt will maintain sustained attention to basic selfcare/grooming tasks with no more than mod instructional cueing for re-direction. OT Short Term Goal 2 (Week 1): Pt will maintain static sitting balance EOB with supervision for 4 mins in preparation for selfcare tasks. OT Short Term Goal 3 (Week 1): Pt will complete UB bathing with min assist for two consecutive sessions. OT Short Term Goal 4 (Week 1): Pt will complete sit to stand with total +2 (pt 30%) during LB bathing or dressing.  Skilled Therapeutic Interventions/Progress Updates:    Patient in bed finishing with brief change post BM with nursing staff.  He is alert, oriented to self, place, month.  He presents as restless and with impaired safety.  O2 via [place] 2L t/o session.  He is able to wash upper body at bed level with mod A.  Donns shorts with max A and cues for sequencing.  Rolling in bed with min A.  Side lying to sitting edge of bed with mod A.  He is able to sit unsupported with CS but impaired attention limits safety in this position.  Sit to stand at edge of bed with max A of one with stand by of +2, SPT bed to w/c max A of one, stand by of +2.  Oral care with suction toothbrush max A.  Donns [place] shirt with max A and cues for technique.  One ice chip via spoon provided at his request - he has moderate cough response.  Sit to stand from w/c x 3 with max A and cues for upright posture.  He remained seated in TIS w/c at close of session, chair tilted, seat belt alarm set, wrist restraints in place, telesitter in place and nursing present for care.    Therapy Documentation Precautions:  Precautions Precautions: Fall, Other (comment) Precaution Comments:  supplemental O2 via [place], L hemi, impaired safety awareness, impulsive, NPO Restrictions Weight Bearing Restrictions: No   Therapy/Group: Individual Therapy  Barrie Lyme 10/01/2020, 7:28 AM

## 2020-10-02 ENCOUNTER — Encounter (HOSPITAL_COMMUNITY): Payer: Self-pay | Admitting: Physical Medicine & Rehabilitation

## 2020-10-02 ENCOUNTER — Encounter (HOSPITAL_COMMUNITY)
Admission: RE | Disposition: A | Discharge status: Skilled Nursing Facility | Payer: Self-pay | Source: Intra-hospital | Attending: Physical Medicine & Rehabilitation

## 2020-10-02 ENCOUNTER — Inpatient Hospital Stay (HOSPITAL_COMMUNITY): Payer: Medicaid Other | Admitting: Anesthesiology

## 2020-10-02 DIAGNOSIS — E1165 Type 2 diabetes mellitus with hyperglycemia: Secondary | ICD-10-CM

## 2020-10-02 DIAGNOSIS — E1169 Type 2 diabetes mellitus with other specified complication: Secondary | ICD-10-CM

## 2020-10-02 DIAGNOSIS — K921 Melena: Secondary | ICD-10-CM | POA: Diagnosis not present

## 2020-10-02 DIAGNOSIS — Z931 Gastrostomy status: Secondary | ICD-10-CM

## 2020-10-02 DIAGNOSIS — Z794 Long term (current) use of insulin: Secondary | ICD-10-CM

## 2020-10-02 DIAGNOSIS — K222 Esophageal obstruction: Secondary | ICD-10-CM

## 2020-10-02 DIAGNOSIS — D62 Acute posthemorrhagic anemia: Secondary | ICD-10-CM

## 2020-10-02 DIAGNOSIS — E782 Mixed hyperlipidemia: Secondary | ICD-10-CM

## 2020-10-02 DIAGNOSIS — K922 Gastrointestinal hemorrhage, unspecified: Secondary | ICD-10-CM | POA: Diagnosis not present

## 2020-10-02 DIAGNOSIS — E440 Moderate protein-calorie malnutrition: Secondary | ICD-10-CM

## 2020-10-02 HISTORY — PX: ESOPHAGOGASTRODUODENOSCOPY: SHX5428

## 2020-10-02 HISTORY — PX: BIOPSY: SHX5522

## 2020-10-02 LAB — GLUCOSE, CAPILLARY
Glucose-Capillary: 223 mg/dL — ABNORMAL HIGH (ref 70–99)
Glucose-Capillary: 57 mg/dL — ABNORMAL LOW (ref 70–99)
Glucose-Capillary: 59 mg/dL — ABNORMAL LOW (ref 70–99)
Glucose-Capillary: 72 mg/dL (ref 70–99)
Glucose-Capillary: 79 mg/dL (ref 70–99)
Glucose-Capillary: 80 mg/dL (ref 70–99)

## 2020-10-02 LAB — CBC
HCT: 22.8 % — ABNORMAL LOW (ref 39.0–52.0)
HCT: 23.1 % — ABNORMAL LOW (ref 39.0–52.0)
HCT: 23.2 % — ABNORMAL LOW (ref 39.0–52.0)
Hemoglobin: 7.6 g/dL — ABNORMAL LOW (ref 13.0–17.0)
Hemoglobin: 7.6 g/dL — ABNORMAL LOW (ref 13.0–17.0)
Hemoglobin: 7.6 g/dL — ABNORMAL LOW (ref 13.0–17.0)
MCH: 32.1 pg (ref 26.0–34.0)
MCH: 31.8 pg (ref 26.0–34.0)
MCH: 31.7 pg (ref 26.0–34.0)
MCHC: 33.3 g/dL (ref 30.0–36.0)
MCHC: 32.9 g/dL (ref 30.0–36.0)
MCHC: 32.8 g/dL (ref 30.0–36.0)
MCV: 96.2 fL (ref 80.0–100.0)
MCV: 96.7 fL (ref 80.0–100.0)
MCV: 96.7 fL (ref 80.0–100.0)
Platelets: 261 10*3/uL (ref 150–400)
Platelets: 271 10*3/uL (ref 150–400)
Platelets: 268 10*3/uL (ref 150–400)
RBC: 2.37 MIL/uL — ABNORMAL LOW (ref 4.22–5.81)
RBC: 2.39 MIL/uL — ABNORMAL LOW (ref 4.22–5.81)
RBC: 2.4 MIL/uL — ABNORMAL LOW (ref 4.22–5.81)
RDW: 14.9 % (ref 11.5–15.5)
RDW: 14.7 % (ref 11.5–15.5)
RDW: 15 % (ref 11.5–15.5)
WBC: 12.8 10*3/uL — ABNORMAL HIGH (ref 4.0–10.5)
WBC: 13.5 10*3/uL — ABNORMAL HIGH (ref 4.0–10.5)
WBC: 11.1 10*3/uL — ABNORMAL HIGH (ref 4.0–10.5)
nRBC: 0 % (ref 0.0–0.2)
nRBC: 0.1 % (ref 0.0–0.2)
nRBC: 0 % (ref 0.0–0.2)

## 2020-10-02 LAB — ABO/RH: ABO/RH(D): O POS

## 2020-10-02 LAB — OCCULT BLOOD, POC DEVICE: Fecal Occult Bld: POSITIVE — AB

## 2020-10-02 LAB — TYPE AND SCREEN
ABO/RH(D): O POS
Antibody Screen: NEGATIVE

## 2020-10-02 SURGERY — EGD (ESOPHAGOGASTRODUODENOSCOPY)
Anesthesia: Monitor Anesthesia Care

## 2020-10-02 MED ORDER — PROPOFOL 10 MG/ML IV BOLUS
INTRAVENOUS | Status: DC | PRN
  Administered 2020-10-02: 30 mg via INTRAVENOUS

## 2020-10-02 MED ORDER — CLOPIDOGREL BISULFATE 75 MG PO TABS
75.0000 mg | ORAL_TABLET | Freq: Every day | ORAL | Status: DC
  Administered 2020-10-03 – 2020-10-09 (×7): 75 mg
  Filled 2020-10-02 (×7): qty 1

## 2020-10-02 MED ORDER — LACTATED RINGERS IV SOLN
INTRAVENOUS | Status: AC | PRN
  Administered 2020-10-02: 1000 mL via INTRAVENOUS

## 2020-10-02 MED ORDER — LIDOCAINE 2% (20 MG/ML) 5 ML SYRINGE
INTRAMUSCULAR | Status: DC | PRN
  Administered 2020-10-02: 60 mg via INTRAVENOUS

## 2020-10-02 MED ORDER — PROPOFOL 500 MG/50ML IV EMUL
INTRAVENOUS | Status: DC | PRN
  Administered 2020-10-02: 150 ug/kg/min via INTRAVENOUS

## 2020-10-02 MED ORDER — PANTOPRAZOLE 80MG IVPB - SIMPLE MED
80.0000 mg | Freq: Once | INTRAVENOUS | Status: AC
  Administered 2020-10-02: 80 mg via INTRAVENOUS
  Filled 2020-10-02 (×2): qty 100
  Filled 2020-10-02: qty 80

## 2020-10-02 MED ORDER — METOCLOPRAMIDE HCL 5 MG/ML IJ SOLN
10.0000 mg | Freq: Once | INTRAMUSCULAR | Status: AC
  Administered 2020-10-02: 10 mg via INTRAVENOUS
  Filled 2020-10-02: qty 2

## 2020-10-02 MED ORDER — INSULIN DETEMIR 100 UNIT/ML ~~LOC~~ SOLN
8.0000 [IU] | Freq: Two times a day (BID) | SUBCUTANEOUS | Status: DC
  Administered 2020-10-02 – 2020-10-06 (×7): 8 [IU] via SUBCUTANEOUS
  Filled 2020-10-02 (×9): qty 0.08

## 2020-10-02 MED ORDER — PANTOPRAZOLE SODIUM 40 MG IV SOLR
40.0000 mg | Freq: Two times a day (BID) | INTRAVENOUS | Status: DC
  Administered 2020-10-02 – 2020-10-03 (×3): 40 mg via INTRAVENOUS
  Filled 2020-10-02 (×4): qty 40

## 2020-10-02 MED ORDER — SODIUM CHLORIDE (PF) 0.9 % IJ SOLN
INTRAMUSCULAR | Status: AC
  Filled 2020-10-02: qty 10

## 2020-10-02 MED ORDER — DEXTROSE 50 % IV SOLN
INTRAVENOUS | Status: AC
  Administered 2020-10-02: 50 mL
  Filled 2020-10-02: qty 50

## 2020-10-02 MED ORDER — SODIUM CHLORIDE 0.9 % IV SOLN: INTRAVENOUS | Status: DC

## 2020-10-02 NOTE — Progress Notes (Signed)
[place] PHYSICAL MEDICINE & REHABILITATION PROGRESS NOTE  Subjective/Complaints: Patient seen laying in bed this morning.  He states he did not sleep well overnight.  No sleep chart.  Overnight patient noted to have respiratory distress, loose stools.  Concern for significant blood loss.  ROS: Limited due to cognition.  Objective: Vital Signs: Blood pressure 130/77, pulse 80, temperature 97.8 F (36.6 C), resp. rate 19, height 6' (1.829 m), weight 81.4 kg, SpO2 (!) 89 %. DG Abd 1 View  Result Date: 10/01/2020 CLINICAL DATA:  Feeding tube placement. EXAM: ABDOMEN - 1 VIEW COMPARISON:  Gastrostomy tube placement exam yesterday. FINDINGS: Portable AP supine view of the abdomen obtained after the installation of 30 cc via indwelling gastrostomy tube. Gastrostomy tube balloon projects over the left upper quadrant. Contrast opacifies the stomach. There is no evidence of extravasation or leak. Again seen contrast in the colon from additional recent gastrostomy evaluations. Contrast seen left laterally on yesterday's exam is not included in the field of view. IMPRESSION: 1. Gastrostomy tube in the stomach. No evidence of extravasation or leak. 2. Contrast in the colon from recent gastrostomy evaluations. Electronically Signed   By: Narda Rutherford M.D.   On: 10/01/2020 21:09   DG Abd 1 View  Result Date: 09/30/2020 CLINICAL DATA:  Peg placement. EXAM: ABDOMEN - 1 VIEW COMPARISON:  Radiograph yesterday. FINDINGS: Single frontal view of the abdomen obtained. Gastrostomy tube projects over the left upper quadrant in the region of the stomach. There is contrast opacifying the gastric lumen. Type and amount of contrast is not specified. There is no evidence of extravasation or leak. Contrast is visualized about the left lateral abdomen but is likely external to the patient, and is lateral to the colon only partially included in the field of view. There is contrast in the colon from yesterday's exam.  IMPRESSION: 1. Gastrostomy tube in the stomach with contrast opacifying the gastric lumen. No evidence of extravasation or leak. 2. Contrast about the left lateral abdomen is likely external to the patient, and only partially included in the field of view. 3. There is contrast within the left colon from yesterday's exam. Electronically Signed   By: Narda Rutherford M.D.   On: 09/30/2020 15:31   DG Chest Port 1 View  Result Date: 10/01/2020 CLINICAL DATA:  Acute respiratory distress EXAM: PORTABLE CHEST 1 VIEW COMPARISON:  09/23/2020 FINDINGS: Cardiac shadow is within normal limits. Feeding catheter has been removed. The lungs are well aerated bilaterally. Previously seen left-sided airspace opacities have resolved in the interval. No new focal abnormality is noted. IMPRESSION: No acute abnormality noted. Electronically Signed   By: Alcide Clever M.D.   On: 10/01/2020 19:56   Recent Labs    10/02/20 0322 10/02/20 0712  WBC 12.8* 13.5*  HGB 7.6* 7.6*  HCT 22.8* 23.1*  PLT 261 271    Recent Labs    10/01/20 0531 10/01/20 2021  NA 145 139  K 3.6 3.8  CL 116* 110  CO2 25 23  GLUCOSE 88 159*  BUN 21* 16  CREATININE 0.88 0.81  CALCIUM 8.9 8.8*     Intake/Output Summary (Last 24 hours) at 10/02/2020 1059 Last data filed at 10/02/2020 0900 Gross per 24 hour  Intake 100 ml  Output --  Net 100 ml         Physical Exam: BP 130/77 (BP Location: Left Arm)   Pulse 80   Temp 97.8 F (36.6 C)   Resp 19   Ht 6' (1.829  m)   Wt 81.4 kg   SpO2 (!) 89%   BMI 24.34 kg/m  Constitutional: No distress . Vital signs reviewed. HENT: Normocephalic.  Atraumatic. Eyes: EOMI. No discharge. Cardiovascular: No JVD.  RRR. Respiratory: Normal effort.  No stridor.  Bilateral clear to auscultation. GI: Non-distended.  BS +.  + PEG. Skin: Warm and dry.  Intact. Psych: Impulsive.  Hyperactive. Musc: No edema in extremities.  No tenderness in extremities. Neuro: Alert Dysarthria, unchanged LUE:  Shoulder abduction, elbow flexion/extension 1/5, handgrip 3/5, improving LLE: 1/5 proximal distal  Assessment/Plan: 1. Functional deficits which require 3+ hours per day of interdisciplinary therapy in a comprehensive inpatient rehab setting. Physiatrist is providing close team supervision and 24 hour management of active medical problems listed below. Physiatrist and rehab team continue to assess barriers to discharge/monitor patient progress toward functional and medical goals   Care Tool:  Bathing    Body parts bathed by patient: Chest, Left arm   Body parts bathed by helper: Right arm, Buttocks     Bathing assist Assist Level: 2 Helpers     Upper Body Dressing/Undressing Upper body dressing   What is the patient wearing?: Pull over shirt, Hospital gown only    Upper body assist Assist Level: Dependent - Patient 0%    Lower Body Dressing/Undressing Lower body dressing      What is the patient wearing?: Incontinence brief     Lower body assist Assist for lower body dressing: 2 Helpers     Toileting Toileting    Toileting assist Assist for toileting: Dependent - Patient 0%     Transfers Chair/bed transfer  Transfers assist  Chair/bed transfer activity did not occur: Safety/medical concerns  Chair/bed transfer assist level: 2 Helpers     Locomotion Ambulation   Ambulation assist   Ambulation activity did not occur: Safety/medical concerns          Walk 10 feet activity   Assist  Walk 10 feet activity did not occur: Safety/medical concerns        Walk 50 feet activity   Assist Walk 50 feet with 2 turns activity did not occur: Safety/medical concerns         Walk 150 feet activity   Assist Walk 150 feet activity did not occur: Safety/medical concerns         Walk 10 feet on uneven surface  activity   Assist Walk 10 feet on uneven surfaces activity did not occur: Safety/medical concerns          Wheelchair     Assist Will patient use wheelchair at discharge?: Yes Type of Wheelchair: Manual Wheelchair activity did not occur: Safety/medical concerns         Wheelchair 50 feet with 2 turns activity    Assist    Wheelchair 50 feet with 2 turns activity did not occur: Safety/medical concerns       Wheelchair 150 feet activity     Assist  Wheelchair 150 feet activity did not occur: Safety/medical concerns        Medical Problem List and Plan: 1.   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 Hx of prior stroke 6/22.  Continue CIR 2.  Impaired mobility: Continue Lovenox             -antiplatelet therapy: Continue Aspirin 81 mg daily and Plavix  75 mg dailyx 3 weeks 3. Pain: d/c IV Dilaudid. Decrease Hydrocodone to 1 tablet q4H needed  Appears controlled on 8/17 4. Anxiety: Continue Xanax as needed. Increased seroquel to 25mg  HS 5. Neuropsych: This patient is not capable of making decisions on his 1.40 on 8/12 behalf.  Telemetry sitter for safety, continue  Wrist restraints for safety, continue 6. Skin/Wound Care: Routine skin checks 7. 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 -SLP follow up, advance diet as tolerated 9. Cocaine/tobacco abuse.UDS positive cocaine.  Counseled on appropriate 10.  Diabetes mellitus.  Hemoglobin A1c 6.4.  NovoLog 2 units every 4 hours  Diabetic teaching when appropriate  Levemir decreased to 8 on 8/17 11.  Hyperlipidemia.  Crestor/fenofibrate 12.Aspiration pneumonia.  Completed 14 days antibiotic 13.  Constipation.  MiraLAX 14.  Acute blood loss anemia  Hemoglobin 7.6 on 8/17  GI consulted with plans for scope 15.   Leukocytosis  WBCs 13.5 on 8/17, improving 16.  AKI: Resolved  Creatinine 0.81 on 8/16  IVF initiated, IVF DC'd 17. Hypernatremia: Sodium 139 on 8/16, improved 18. HTN: started propanolol 10mg  daily.  Labile on 8/17, monitor for trend  LOS: 5 days A FACE TO FACE EVALUATION WAS PERFORMED  Nazly Digilio 10/02/2020, 10:59 AM

## 2020-10-02 NOTE — Progress Notes (Signed)
Received a call regarding assessment of this patient due to drop in hemoglobin from 11.4 K to 8.5 K.  Reportedly the patient had dark tarry stools earlier.  Came to bedside, the patient is alert and answering questions appropriately.  O2 saturation 100% on room air.  He is on two-point soft restraints at the wrist bilaterally.  Endorses abdominal pain at the site of his PEG tube.  He has an abdominal binder in place.  Per bedside RN he had pulled at his PEG tube earlier.  Patient is n.p.o. except for ice chips at speech therapy's recommendation.  He receives his feedings via PEG tube intermittently from bolus feedings and free water flushes 230 cc every 6 hours.  KUB confirmed PEG tube placement in the stomach.  Chest x-ray was nonacute.  Repeated CBC showed WBC 12.1 K, hemoglobin 8.3 K from 8.5 K.  Vital signs are stable.  BP 148/93, pulse 94, respiration rate 18, O2 saturation 100% on room air.  Advised bedside RN to hold off on IV fluid for now since the patient is receiving free water flushes via PEG tube and to closely monitor volume status.  CBC ordered to be repeated on 10/02/20 AM.  Informed floor manager, Kathreen Cornfield, that the patient would benefit from an official consultation from the hospitalist service.  No charge note.

## 2020-10-02 NOTE — Progress Notes (Signed)
PROGRESS NOTE        PATIENT DETAILS Name: Brent [name] Age: 55 y.o. Sex: male Date of Birth: Jun 16, 1965 Admit Date: 09/27/2020 Admitting Physician Ranelle Oyster, MD [id number], [name], MD  Brief Narrative: Patient is a 55 y.o. male with history of COPD, DM-2, HLD, HTN-currently at [facility]-after he recently had a acute CVA with left-sided [facility] consulted for upper GI bleeding with acute blood loss anemia.  DVT Prophylaxis : Place TED hose Start: 09/27/20 1413   Subjective: Somewhat agitated-but acknowledges black stools overnight.   Assessment/Plan: UGI bleeding with acute blood loss anemia: Continue to hold aspirin/Plavix-on PPI-GI following-EGD scheduled for later today.  Continue to follow CBC closely-if drops further-we will need PRBC transfusion.    Recent CVA: Continues to have mild left-sided deficits-continue to hold aspirin/Plavix until GI bleeding resolves/EGD results obtained-on statin.  COPD: Not in exacerbation  DM-2:: Hypoglycemic episode earlier this morning-Levemir dosage being adjusted by primary team.  Continue to follow CBGs closely and adjust insulin regimen.  Recent Labs    10/02/20 0018 10/02/20 0427 10/02/20 0555  GLUCAP 223* 59* 80    Rest of the issues per primary team-TRH service will continue to follow.  Diet: Diet Order             Diet NPO time specified  Diet effective now                    Code Status: Full code   Family Communication: None at bedside  Disposition Plan: Per primary service.   Antimicrobial agents: Anti-infectives (From admission, onward)    None        Time spent: 25-minutes-Greater than 50% of this time was spent in counseling, explanation of diagnosis, planning of further management, and coordination of care.  MEDICATIONS: Scheduled Meds:  [MAR Hold] arformoterol  15 mcg Nebulization BID   [MAR Hold] budesonide (PULMICORT) nebulizer solution  0.5 mg  Nebulization BID   [MAR Hold] carBAMazepine  200 mg Per Tube Q6H   [MAR Hold] diatrizoate meglumine-sodium  30 mL Per Tube Once   [MAR Hold] fenofibrate  160 mg Per Tube Daily   [MAR Hold] free water  230 mL Per Tube QID   [MAR Hold] guaiFENesin  10 mL Per Tube Q4H   [MAR Hold] insulin aspart  0-20 Units Subcutaneous Q4H   [MAR Hold] insulin aspart  2 Units Subcutaneous Q4H   insulin detemir  8 Units Subcutaneous BID   [MAR Hold] pantoprazole (PROTONIX) IV  40 mg Intravenous Q12H   [MAR Hold] polyethylene glycol  17 g Per Tube Daily   [MAR Hold] propranolol  10 mg Oral Daily   [MAR Hold] QUEtiapine  25 mg Per Tube QHS   [MAR Hold] rosuvastatin  20 mg Per Tube Daily   Continuous Infusions:  sodium chloride     PRN Meds:.[MAR Hold] acetaminophen (TYLENOL) oral liquid 160 mg/5 mL, [MAR Hold] ALPRAZolam, [MAR Hold] HYDROcodone-acetaminophen, [MAR Hold] ipratropium-albuterol, [MAR Hold] melatonin, [MAR Hold] nitroGLYCERIN   PHYSICAL EXAM: Vital signs: Vitals:   10/02/20 0431 10/02/20 0825 10/02/20 0828 10/02/20 1025  BP: 127/84  130/77   Pulse: 78 83 73 80  Resp: 18 18 19 19   Temp: 97.6 F (36.4 C)  97.8 F (36.6 C)   TempSrc: Oral     SpO2: 96% 98% 100% (!) 89%  Weight:  Height:       Filed Weights   09/27/20 1615 09/29/20 0410 10/01/20 0412  Weight: 81.3 kg 82.1 kg 81.4 kg   Body mass index is 24.34 kg/m.   Gen Exam: Awake-somewhat agitated HEENT:atraumatic, normocephalic Chest: B/L clear to auscultation anteriorly CVS:S1S2 regular Abdomen:soft non tender, non distended Extremities:no edema Neurology: Mild left-sided deficits. Skin: no rash  I have personally reviewed following labs and imaging studies  LABORATORY DATA: CBC: Recent Labs  Lab 09/27/20 0540 09/30/20 0710 10/01/20 2021 10/02/20 0322 10/02/20 0712  WBC 22.9* 12.6* 12.1* 12.8* 13.5*  NEUTROABS  --  10.0*  --   --   --   HGB 11.4* 8.5* 8.3* 7.6* 7.6*  HCT 34.5* 26.2* 25.0* 22.8* 23.1*   MCV 97.2 99.2 97.3 96.2 96.7  PLT 276 279 280 261 271    Basic Metabolic Panel: Recent Labs  Lab 09/27/20 0540 09/30/20 0710 10/01/20 0531 10/01/20 2021  NA 145 149* 145 139  K 5.0 3.7 3.6 3.8  CL 112* 116* 116* 110  CO2 25 27 25 23   GLUCOSE 168* 84 88 159*  BUN 71* 35* 21* 16  CREATININE 1.40* 1.16 0.88 0.81  CALCIUM 9.9 9.3 8.9 8.8*    GFR: Estimated Creatinine Clearance: 113.1 mL/min (by C-G formula based on SCr of 0.81 mg/dL).  Liver Function Tests: Recent Labs  Lab 09/30/20 0710 10/01/20 2021  AST 19 16  ALT 15 14  ALKPHOS 53 52  BILITOT 0.6 0.4  PROT 6.0* 5.6*  ALBUMIN 2.8* 2.8*   No results for input(s): LIPASE, AMYLASE in the last 168 hours. No results for input(s): AMMONIA in the last 168 hours.  Coagulation Profile: No results for input(s): INR, PROTIME in the last 168 hours.  Cardiac Enzymes: No results for input(s): CKTOTAL, CKMB, CKMBINDEX, TROPONINI in the last 168 hours.  BNP (last 3 results) No results for input(s): PROBNP in the last 8760 hours.  Lipid Profile: No results for input(s): CHOL, HDL, LDLCALC, TRIG, CHOLHDL, LDLDIRECT in the last 72 hours.  Thyroid Function Tests: No results for input(s): TSH, T4TOTAL, FREET4, T3FREE, THYROIDAB in the last 72 hours.  Anemia Panel: No results for input(s): VITAMINB12, FOLATE, FERRITIN, TIBC, IRON, RETICCTPCT in the last 72 hours.  Urine analysis:    Component Value Date/Time   COLORURINE YELLOW 09/11/2020 1110   APPEARANCEUR CLEAR 09/11/2020 1110   LABSPEC >1.046 (H) 09/11/2020 1110   PHURINE 5.0 09/11/2020 1110   GLUCOSEU NEGATIVE 09/11/2020 1110   HGBUR NEGATIVE 09/11/2020 1110   BILIRUBINUR NEGATIVE 09/11/2020 1110   KETONESUR NEGATIVE 09/11/2020 1110   PROTEINUR 30 (A) 09/11/2020 1110   NITRITE NEGATIVE 09/11/2020 1110   LEUKOCYTESUR NEGATIVE 09/11/2020 1110    Sepsis Labs: Lactic Acid, Venous    Component Value Date/Time   LATICACIDVEN 1.2 09/11/2020 0855     MICROBIOLOGY: No results found for this or any previous visit (from the past 240 hour(s)).  RADIOLOGY STUDIES/RESULTS: DG Abd 1 View  Result Date: 10/01/2020 CLINICAL DATA:  Feeding tube placement. EXAM: ABDOMEN - 1 VIEW COMPARISON:  Gastrostomy tube placement exam yesterday. FINDINGS: Portable AP supine view of the abdomen obtained after the installation of 30 cc via indwelling gastrostomy tube. Gastrostomy tube balloon projects over the left upper quadrant. Contrast opacifies the stomach. There is no evidence of extravasation or leak. Again seen contrast in the colon from additional recent gastrostomy evaluations. Contrast seen left laterally on yesterday's exam is not included in the field of view. IMPRESSION: 1. Gastrostomy tube in  the stomach. No evidence of extravasation or leak. 2. Contrast in the colon from recent gastrostomy evaluations. Electronically Signed   By: Narda Rutherford M.D.   On: 10/01/2020 21:09   DG Abd 1 View  Result Date: 09/30/2020 CLINICAL DATA:  Peg placement. EXAM: ABDOMEN - 1 VIEW COMPARISON:  Radiograph yesterday. FINDINGS: Single frontal view of the abdomen obtained. Gastrostomy tube projects over the left upper quadrant in the region of the stomach. There is contrast opacifying the gastric lumen. Type and amount of contrast is not specified. There is no evidence of extravasation or leak. Contrast is visualized about the left lateral abdomen but is likely external to the patient, and is lateral to the colon only partially included in the field of view. There is contrast in the colon from yesterday's exam. IMPRESSION: 1. Gastrostomy tube in the stomach with contrast opacifying the gastric lumen. No evidence of extravasation or leak. 2. Contrast about the left lateral abdomen is likely external to the patient, and only partially included in the field of view. 3. There is contrast within the left colon from yesterday's exam. Electronically Signed   By: Narda Rutherford  M.D.   On: 09/30/2020 15:31   DG Chest Port 1 View  Result Date: 10/01/2020 CLINICAL DATA:  Acute respiratory distress EXAM: PORTABLE CHEST 1 VIEW COMPARISON:  09/23/2020 FINDINGS: Cardiac shadow is within normal limits. Feeding catheter has been removed. The lungs are well aerated bilaterally. Previously seen left-sided airspace opacities have resolved in the interval. No new focal abnormality is noted. IMPRESSION: No acute abnormality noted. Electronically Signed   By: Alcide Clever M.D.   On: 10/01/2020 19:56     LOS: 5 days   Jeoffrey Massed, MD  [facility]    To contact the attending provider between 7A-7P or the covering provider during after hours 7P-7A, please log into the web site [email or web address] and access using universal [place] password for that web site. If you do not have the password, please call the hospital operator.  10/02/2020, 11:51 AM

## 2020-10-02 NOTE — Progress Notes (Signed)
Patient went into respiratory distress at the beginning of shift change 1900.  Respiratory therapist was called. Patient given breathing treatment.  Rapid response was notified.  Onalee Hua, RN responded.  EKG performed, patient made NPO, portable chest xray performed, and KUB performed.  Patient's condition is stable and currently resting.

## 2020-10-02 NOTE — Consult Note (Addendum)
[facility] Consult: 8:32 AM 10/02/2020  LOS: 5 days    Referring Provider: Dr Riley Kill  Primary Care Physician:  Beatrix Fetters, MD Primary Gastroenterologist:  Dr. Karilyn Cota    Reason for Consultation:  melenic stool.  Acute anemia.     HPI: Brent [name] is a 55 y.o. male.  PMH GERD.  COPD, home oxygen..  Obesity.  Colon polyps.  Oropharyngeal dysphagia.  Cholelithiasis.  DM 2, IDDM.  Neuropathy.  Rib fractures.  Seizures.   Community-acquired pneumonia.  CVA before 2022. Hepatic steatosis per CT O 10/2019.  HCV negative 04/2020.  C diff + toxin 01/2020.  Folate, B12 deficient w/O anemia 04/2019.  Cirrhosis and portal htn listed as dx in note from 10/2019, but see no imaging or endoscopic studies confirming this.    08/2015 colonoscopy.  Dr. Denzil Magnuson.  Screening study.  Polyps removed from descending colon, rectum.  1 of 3 polyps was a tubular adenoma. 2020 barium esophagram. 2020 speech-language pathology swallow evaluation.  Educated on compensatory swallowing strategies, pharyngeal strengthening exercises.  6/9 -07/29/2020 admission for stroke.  This in setting of crack cocaine use.  Sustained persistent speech and swallowing deficits.  Speech evaluation recommended honey thickened liquids.  Discharged on Plavix, low-dose aspirin, food thickener, omeprazole 40 mg/day.  7/27 -09/26/2020 admission.  Again cocaine positive.  Sepsis, aspiration pneumonia, COPD flare with hypoxic respiratory failure and CVA.  09/24/2020 G-tube placement by IR. 09/26/2020 CTAP showed smooth liver, gallstones without complication.  Good positioning of G-tube.  Nonspecific stranding in G-tube tract likely normal 2 days out from G-tube placement.  Chronic abdominal wall laxity and extensive postsurgical changes of anterior  abdominal wall.  Patchy left lung base opacities. Discharged to [facility] ED inpatient rehab.  Continued on Plavix, omeprazole 40 bid (see below as PPI not contd).  Low-dose aspirin was to restart 8/12  Patient's been having trouble clearing his throat, handling secretions.  Rapid response team called for tachypnea last night, no associated hypoxia.  Reported pain at the PEG site and staff notes patient had tugged at PEG tube earlier in the day.  Staff reports dark, diarrheal stools since Monday.  Hgb11.4 one week ago, 8.3 yesterday, 7.6 on 2 occasions this morning.  MCV 96.  Platelets 271.  INR 1.  Last Plavix, ASA was yesterday 8/16.  Although it was recommended he has not been getting any PPI or H2 blocker but got a dose of Protonix 80 mg IV at 04 30 this morning.  Patient denying abdominal pain presently.  Denies nausea or vomiting.  Staff relays patient fixated on getting food but he is essentially n.p.o.  Tube feeds are on hold as of 230 this morning.  231 pounds in 10/2018.  Family history reviewed in chart and there is a history of MI in his father.  No cancers are listed.   Alcohol intake history notes he drinks beer on weekend.   Past Medical History:  Diagnosis Date   Aplastic anemia, unspecified (HCC)    Chest pain, unspecified    Chronic airway  obstruction, not elsewhere classified    Cocaine substance abuse (HCC)    Neuropathy    Other and unspecified hyperlipidemia    Shortness of breath    Stroke (HCC) 06/17/2020   Tobacco use disorder    Type II or unspecified type diabetes mellitus without mention of complication, not stated as uncontrolled    Unspecified epilepsy without mention of intractable epilepsy    Unspecified essential hypertension     Past Surgical History:  Procedure Laterality Date   CARDIAC CATHETERIZATION  10/02/2010    Nonobstructive mild coronary plaque   IR GASTROSTOMY TUBE MOD SED  09/24/2020   KNEE SURGERY Bilateral 2018   Repair   PERFORATED VISCUS  SURGERY     MULTIPLE FRACTURES    Prior to Admission medications   Medication Sig Start Date End Date Taking? Authorizing Provider  acetaminophen (TYLENOL) 160 MG/5ML solution Place 20.3 mLs (650 mg total) into feeding tube every 4 (four) hours as needed for moderate pain or fever. 09/26/20   Esaw Grandchild A, DO  albuterol (PROVENTIL) (2.5 MG/3ML) 0.083% nebulizer solution Take 2.5 mg by nebulization every 4 (four) hours as needed for wheezing or shortness of breath.    [provider]  albuterol (VENTOLIN HFA) 108 (90 Base) MCG/ACT inhaler Inhale 2 puffs into the lungs every 6 (six) hours as needed for wheezing or shortness of breath.    [provider]  ALPRAZolam Prudy Feeler) 0.25 MG tablet Take 1 tablet (0.25 mg total) by mouth 3 (three) times daily as needed for anxiety. 09/26/20   Pennie Banter, DO  arformoterol (BROVANA) 15 MCG/2ML NEBU Take 2 mLs (15 mcg total) by nebulization 2 (two) times daily. 09/26/20   Pennie Banter, DO  aspirin 81 MG chewable tablet Place 1 tablet (81 mg total) into feeding tube daily. 09/27/20   Esaw Grandchild A, DO  budesonide (PULMICORT) 0.5 MG/2ML nebulizer solution Take 2 mLs (0.5 mg total) by nebulization 2 (two) times daily. 09/26/20   Pennie Banter, DO  carBAMazepine (TEGRETOL) 100 MG/5ML suspension Place 10 mLs (200 mg total) into feeding tube every 6 (six) hours. 09/26/20   Pennie Banter, DO  chlorhexidine (PERIDEX) 0.12 % solution 15 mLs by Mouth Rinse route 2 (two) times daily. 09/26/20   Pennie Banter, DO  clopidogrel (PLAVIX) 75 MG tablet Place 1 tablet (75 mg total) into feeding tube daily. 09/27/20   Esaw Grandchild A, DO  fenofibrate 160 MG tablet Take 160 mg by mouth daily. 12/11/16   [provider]  guaiFENesin (ROBITUSSIN) 100 MG/5ML SOLN Place 10 mLs (200 mg total) into feeding tube every 4 (four) hours. 09/26/20   Pennie Banter, DO  HYDROcodone-acetaminophen (NORCO/VICODIN) 5-325 MG tablet Take 1-2  tablets by mouth every 4 (four) hours as needed for moderate pain. 09/26/20   Esaw Grandchild A, DO  insulin aspart (NOVOLOG) 100 UNIT/ML injection Inject 0-20 Units into the skin every 4 (four) hours. 09/26/20   Esaw Grandchild A, DO  insulin aspart (NOVOLOG) 100 UNIT/ML injection Inject 3 Units into the skin every 4 (four) hours. 09/26/20   Esaw Grandchild A, DO  insulin detemir (LEVEMIR) 100 UNIT/ML injection Inject 0.3 mLs (30 Units total) into the skin 2 (two) times daily. 09/26/20   Esaw Grandchild A, DO  ipratropium-albuterol (DUONEB) 0.5-2.5 (3) MG/3ML SOLN Take 3 mLs by nebulization 2 (two) times daily. 09/26/20   Pennie Banter, DO  liver oil-zinc oxide (DESITIN) 40 % ointment Apply topically daily as needed for  irritation (itchy rash). 09/26/20   Esaw Grandchild A, DO  melatonin 3 MG TABS tablet Place 1 tablet (3 mg total) into feeding tube at bedtime as needed. 09/26/20   Pennie Banter, DO  Mouthwashes (MOUTH RINSE) LIQD solution 15 mLs by Mouth Rinse route 2 times daily at 12 noon and 4 pm. 09/26/20   Pennie Banter, DO  nitroGLYCERIN (NITROSTAT) 0.4 MG SL tablet Place 1 tablet (0.4 mg total) under the tongue every 5 (five) minutes as needed for chest pain. 09/26/20   Pennie Banter, DO  Nutritional Supplements (FEEDING SUPPLEMENT, OSMOLITE 1.5 CAL,) LIQD Place 1,000 mLs into feeding tube continuous. 09/26/20   Pennie Banter, DO  Nutritional Supplements (FEEDING SUPPLEMENT, PROSOURCE TF,) liquid Place 45 mLs into feeding tube daily. 09/27/20   Pennie Banter, DO  Omega-3 Fatty Acids (FISH OIL) 1000 MG CAPS Take 500 mg by mouth 2 (two) times daily.    [provider]  omeprazole (PRILOSEC) 40 MG capsule Take 1 capsule (40 mg total) by mouth 2 (two) times daily. 03/21/19   Tawni Pummel B, PA-C  polyethylene glycol (MIRALAX / GLYCOLAX) 17 g packet Place 17 g into feeding tube daily. 09/27/20   Esaw Grandchild A, DO  rosuvastatin (CRESTOR) 20 MG tablet Take 1 tablet (20 mg  total) by mouth daily. 07/30/20   Vassie Loll, MD    Scheduled Meds:  arformoterol  15 mcg Nebulization BID   budesonide (PULMICORT) nebulizer solution  0.5 mg Nebulization BID   carBAMazepine  200 mg Per Tube Q6H   diatrizoate meglumine-sodium  30 mL Per Tube Once   fenofibrate  160 mg Per Tube Daily   free water  230 mL Per Tube QID   guaiFENesin  10 mL Per Tube Q4H   insulin aspart  0-20 Units Subcutaneous Q4H   insulin aspart  2 Units Subcutaneous Q4H   insulin detemir  12 Units Subcutaneous BID   pantoprazole (PROTONIX) IV  40 mg Intravenous Q12H   polyethylene glycol  17 g Per Tube Daily   propranolol  10 mg Oral Daily   QUEtiapine  25 mg Per Tube QHS   rosuvastatin  20 mg Per Tube Daily   Infusions:  PRN Meds: acetaminophen (TYLENOL) oral liquid 160 mg/5 mL, ALPRAZolam, HYDROcodone-acetaminophen, ipratropium-albuterol, melatonin, nitroGLYCERIN   Allergies as of 09/26/2020   (No Known Allergies)    Family History  Problem Relation Age of Onset   Heart attack Father    COPD Father    Coronary artery disease Mother     Social History   Socioeconomic History   Marital status: Single    Spouse name: Not on file   Number of children: Not on file   Years of education: Not on file   Highest education level: Not on file  Occupational History   Not on file  Tobacco Use   Smoking status: Every Day    Packs/day: 1.00    Years: 30.00    Pack years: 30.00    Types: Cigarettes   Smokeless tobacco: Never  Substance and Sexual Activity   Alcohol use: Yes    Comment: drinks beer on the weekends   Drug use: Never   Sexual activity: Not Currently  Other Topics Concern   Not on file  Social History Narrative   Lives in [place] with wife.   Has one daughter and one grandson.   Right handed    Social Determinants of Health   Financial Resource Strain: Not  on file  Food Insecurity: Not on file  Transportation Needs: Not on file  Physical Activity: Not on file   Stress: Not on file  Social Connections: Not on file  Intimate Partner Violence: Not on file    REVIEW OF SYSTEMS: Constitutional: No profound weakness or fatigue. ENT:  No nose bleeds Pulm: Positive for cough.  No dyspnea. CV:  No palpitations, no LE edema.  Denies chest pain. GU:  No hematuria, no frequency GI: See HPI. Heme: Bruising easily but no significant hematoma or overt bleeding other than the dark melenic stools. Transfusions: No prior history of blood transfusions. Neuro:  No headaches, no peripheral tingling or numbness.  No recent seizures.  No syncope. Derm:  No itching, no rash or sores.  Endocrine:  No sweats or chills.  No polyuria or dysuria Immunization: Due.  Has been vaccinated against COVID-19 in 2021. Travel:  None beyond local counties in last few months.    PHYSICAL EXAM: Vital signs in last 24 hours: Vitals:   10/02/20 0825 10/02/20 0828  BP:  130/77  Pulse: 83 73  Resp: 18 19  Temp:  97.8 F (36.6 C)  SpO2: 98% 100%   Wt Readings from Last 3 Encounters:  10/01/20 81.4 kg  09/27/20 81.3 kg  08/12/20 85.7 kg    General: Patient is pale.  Looks somewhat chronically ill.  A bit restless in the bed but overall comfortable. Head: No facial asymmetry or swelling.  No signs of head trauma. Eyes: No scleral icterus.  No conjunctival pallor. Ears: No hearing deficit Nose: No congestion or discharge. Mouth: Poor dentition.  Missing teeth.  Mucosa is moist, pink, clear. Neck: JVD, no masses, no thyromegaly Lungs: Clear bilaterally without labored breathing.  No cough. Heart: RRR.  No MRG.  S1, S2 present Abdomen: Soft without tenderness.  PEG site benign without blood or significant bruising.  No HSM, masses, bruits, hernias appreciated..   Rectal: Dark brown stool is FOBT positive.  The stool is liquid.  No formed or soft stool seen or palpated.  No masses. Musc/Skeltl: No joint redness, swelling or gross deformity. Extremities: No  CCE. Neurologic: Oriented to [facility] and to his stroke and to himself.  Not oriented to year.  Able to speak and follow commands.  No tremors.  Limb movement and strength not tested but moves all 4 limbs grossly. Skin: Some bruising on the arms and abdomen.  These are baseball sized at largest. Nodes: No cervical adenopathy Psych: A bit agitated but cooperative.  Able to speak and follow commands  Intake/Output from previous day: 08/16 0701 - 08/17 0700 In: 100 [IV Piggyback:100] Out: -  Intake/Output this shift: No intake/output data recorded.  LAB RESULTS: Recent Labs    10/01/20 2021 10/02/20 0322 10/02/20 0712  WBC 12.1* 12.8* 13.5*  HGB 8.3* 7.6* 7.6*  HCT 25.0* 22.8* 23.1*  PLT 280 261 271   BMET Lab Results  Component Value Date   NA 139 10/01/2020   NA 145 10/01/2020   NA 149 (H) 09/30/2020   K 3.8 10/01/2020   K 3.6 10/01/2020   K 3.7 09/30/2020   CL 110 10/01/2020   CL 116 (H) 10/01/2020   CL 116 (H) 09/30/2020   CO2 23 10/01/2020   CO2 25 10/01/2020   CO2 27 09/30/2020   GLUCOSE 159 (H) 10/01/2020   GLUCOSE 88 10/01/2020   GLUCOSE 84 09/30/2020   BUN 16 10/01/2020   BUN 21 (H) 10/01/2020   BUN  35 (H) 09/30/2020   CREATININE 0.81 10/01/2020   CREATININE 0.88 10/01/2020   CREATININE 1.16 09/30/2020   CALCIUM 8.8 (L) 10/01/2020   CALCIUM 8.9 10/01/2020   CALCIUM 9.3 09/30/2020   LFT Recent Labs    09/30/20 0710 10/01/20 2021  PROT 6.0* 5.6*  ALBUMIN 2.8* 2.8*  AST 19 16  ALT 15 14  ALKPHOS 53 52  BILITOT 0.6 0.4   PT/INR Lab Results  Component Value Date   INR 1.0 09/23/2020   INR 0.9 09/11/2020   INR 0.81 10/02/2010   Hepatitis Panel No results for input(s): HEPBSAG, HCVAB, HEPAIGM, HEPBIGM in the last 72 hours. C-Diff No components found for: CDIFF Lipase  No results found for: LIPASE  Drugs of Abuse     Component Value Date/Time   LABOPIA NONE DETECTED 09/11/2020 0307   COCAINSCRNUR POSITIVE (A) 09/11/2020 0307    LABBENZ NONE DETECTED 09/11/2020 0307   AMPHETMU NONE DETECTED 09/11/2020 0307   THCU NONE DETECTED 09/11/2020 0307   LABBARB NONE DETECTED 09/11/2020 0307     RADIOLOGY STUDIES: DG Abd 1 View  Result Date: 10/01/2020 CLINICAL DATA:  Feeding tube placement. EXAM: ABDOMEN - 1 VIEW COMPARISON:  Gastrostomy tube placement exam yesterday. FINDINGS: Portable AP supine view of the abdomen obtained after the installation of 30 cc via indwelling gastrostomy tube. Gastrostomy tube balloon projects over the left upper quadrant. Contrast opacifies the stomach. There is no evidence of extravasation or leak. Again seen contrast in the colon from additional recent gastrostomy evaluations. Contrast seen left laterally on yesterday's exam is not included in the field of view. IMPRESSION: 1. Gastrostomy tube in the stomach. No evidence of extravasation or leak. 2. Contrast in the colon from recent gastrostomy evaluations. Electronically Signed   By: Narda Rutherford M.D.   On: 10/01/2020 21:09   DG Abd 1 View  Result Date: 09/30/2020 CLINICAL DATA:  Peg placement. EXAM: ABDOMEN - 1 VIEW COMPARISON:  Radiograph yesterday. FINDINGS: Single frontal view of the abdomen obtained. Gastrostomy tube projects over the left upper quadrant in the region of the stomach. There is contrast opacifying the gastric lumen. Type and amount of contrast is not specified. There is no evidence of extravasation or leak. Contrast is visualized about the left lateral abdomen but is likely external to the patient, and is lateral to the colon only partially included in the field of view. There is contrast in the colon from yesterday's exam. IMPRESSION: 1. Gastrostomy tube in the stomach with contrast opacifying the gastric lumen. No evidence of extravasation or leak. 2. Contrast about the left lateral abdomen is likely external to the patient, and only partially included in the field of view. 3. There is contrast within the left colon from  yesterday's exam. Electronically Signed   By: Narda Rutherford M.D.   On: 09/30/2020 15:31   DG Chest Port 1 View  Result Date: 10/01/2020 CLINICAL DATA:  Acute respiratory distress EXAM: PORTABLE CHEST 1 VIEW COMPARISON:  09/23/2020 FINDINGS: Cardiac shadow is within normal limits. Feeding catheter has been removed. The lungs are well aerated bilaterally. Previously seen left-sided airspace opacities have resolved in the interval. No new focal abnormality is noted. IMPRESSION: No acute abnormality noted. Electronically Signed   By: Alcide Clever M.D.   On: 10/01/2020 19:56      IMPRESSION:   GI bleed with dark, liquid, melenic type stools for the past 3 days.  Possibilities include bleeding from G-tube placed on 8/9, ulcers.  If he actually does  have cirrhosis, this is not a confirmed diagnosis, could have portal gastropathy, esophageal varices.  Liver his INR, platelets, LFTs (other than hypoalbuminemia) are normal  Acute blood loss anemia.  Recurrent, acute CVA.  On low-dose aspirin, Plavix.  Last doses taken on 8/16, yesterday.  Adenomatous colon polyp in 08/2015.  Now at 5 years post colonoscopy and due for polyp surveillance study.  This is not urgent and could be performed as an outpatient by his primary GI in [place].    PLAN:      EGD today.  By phone his mother has provided consent for the procedure.   Jennye Moccasin  10/02/2020, 8:32 AM Phone [phone]    Attending Physician's Attestation   I have taken an interval history, reviewed the chart and examined the patient.   54y/o m with hx of CVA, oropharyngeal dysphagia and aspiration, s/p PEG Aug 9, treated with Plavix and ASA for CVA, with several days of dark stool and drop in hgb.  HD stable.  BUN 71 on Aug 12, steadily declining since then, now 14.  Suspect patient bled from around his gastrostomy site and it has since resolved.  Also questionable cirrhosis diagnosis based on listing in medical record, but labs and  imaging currently not supporting that diagnosis.  Will proceed with EGD to assess for source of upper GI blood loss.  The patient's mental status was somewhat questionable this morning (disoriented), so a telephone consent was obtained from the patient's mother, in addition to explaining the procedure to the patient.  I agree with the Advanced Practitioner's note, impression, and recommendations with updates and my documentation above.   Tiajuana Amass, MD [facility]

## 2020-10-02 NOTE — Transfer of Care (Signed)
Immediate Anesthesia Transfer of Care Note  Patient: Brent [name]  Procedure(s) Performed: ESOPHAGOGASTRODUODENOSCOPY (EGD) BIOPSY  Patient Location: PACU  Anesthesia Type:MAC  Level of Consciousness: drowsy  Airway & Oxygen Therapy: Patient Spontanous Breathing  Post-op Assessment: Report given to RN and Post -op Vital signs reviewed and stable  Post vital signs: Reviewed and stable  Last Vitals:  Vitals Value Taken Time  BP 153/84 10/02/20 1356  Temp 36.7 C 10/02/20 1356  Pulse 83 10/02/20 1356  Resp 19 10/02/20 1356  SpO2 95 % 10/02/20 1356    Last Pain:  Vitals:   10/02/20 1356  TempSrc: Oral  PainSc:       Patients Stated Pain Goal: 0 (24/46/28 6381)  Complications: No notable events documented.

## 2020-10-02 NOTE — Anesthesia Preprocedure Evaluation (Addendum)
Anesthesia Evaluation  Patient identified by MRN, date of birth, ID band Patient awake    Reviewed: Allergy & Precautions, H&P , NPO status , Patient's Chart, lab work & pertinent test results, reviewed documented beta blocker date and time   Airway Mallampati: II  TM Distance: >3 FB Neck ROM: full    Dental no notable dental hx. (+) Poor Dentition, Chipped, Missing, Dental Advisory Given,    Pulmonary sleep apnea , COPD, Current Smoker,    Pulmonary exam normal breath sounds clear to auscultation       Cardiovascular Exercise Tolerance: Good hypertension, Pt. on medications + CAD   Rhythm:regular Rate:Normal  Echo 21' 1. Left ventricular ejection fraction, by estimation, is 65 to 70%. The  left ventricle has normal function. The left ventricle has no regional  wall motion abnormalities. There is moderate left ventricular hypertrophy.  Left ventricular diastolic  parameters are indeterminate.  2. Right ventricular systolic function is normal. The right ventricular  size is normal.  3. The mitral valve is normal in structure. No evidence of mitral valve  regurgitation. No evidence of mitral stenosis.  4. The aortic valve has an indeterminant number of cusps. There is mild  calcification of the aortic valve. There is mild thickening of the aortic  valve. Aortic valve regurgitation is not visualized. No aortic stenosis is  present.  5. Agitated saline contrast bubble study was negative, with no evidence  of any interatrial shunt.    Neuro/Psych Seizures -,  PSYCHIATRIC DISORDERS Anxiety  Neuromuscular disease    GI/Hepatic GERD  Medicated,(+)     substance abuse  cocaine use,   Endo/Other  diabetes, Poorly Controlled, Type 2  Renal/GU negative Renal ROS  negative genitourinary   Musculoskeletal   Abdominal   Peds  Hematology negative hematology ROS (+)   Anesthesia Other Findings    Reproductive/Obstetrics negative OB ROS                            Anesthesia Physical Anesthesia Plan  ASA: 3  Anesthesia Plan: MAC   Post-op Pain Management:    Induction: Intravenous  PONV Risk Score and Plan: 1  Airway Management Planned: Nasal Cannula and Natural Airway  Additional Equipment: None  Intra-op Plan:   Post-operative Plan:   Informed Consent: I have reviewed the patients History and Physical, chart, labs and discussed the procedure including the risks, benefits and alternatives for the proposed anesthesia with the patient or authorized representative who has indicated his/her understanding and acceptance.     Dental Advisory Given  Plan Discussed with: CRNA and Anesthesiologist  Anesthesia Plan Comments:         Anesthesia Quick Evaluation

## 2020-10-02 NOTE — Progress Notes (Signed)
Occupational Therapy Session Note  Patient Details  Name: Brent [name] MRN: [id number] Date of Birth: 01-16-1966  Today's Date: 10/02/2020 OT Individual Time: 1425-1500 OT Individual Time Calculation (min): 35 min    Short Term Goals: Week 1:  OT Short Term Goal 1 (Week 1): Pt will maintain sustained attention to basic selfcare/grooming tasks with no more than mod instructional cueing for re-direction. OT Short Term Goal 2 (Week 1): Pt will maintain static sitting balance EOB with supervision for 4 mins in preparation for selfcare tasks. OT Short Term Goal 3 (Week 1): Pt will complete UB bathing with min assist for two consecutive sessions. OT Short Term Goal 4 (Week 1): Pt will complete sit to stand with total +2 (pt 30%) during LB bathing or dressing.  Skilled Therapeutic Interventions/Progress Updates:    Pt received supine with lab staff present for blood draw. Pt agitated re needing to get to EOB. Therapeutic use of self and calming cues to encourage self regulation. Pt completed bed mobility to EOB with min A overall. Pt with frequent L lean and LOB but able to maintain sitting balance for 30 sec-1 minute at a time with only supervision. Pt requested to stand to urinate so OT attempted sit > stand with max-total A but pt unable to clear buttocks even partially. Pt scooted toward [facility] with mod A and mod cueing. Pt required gentle redirection to task often. Pt completed oral care supine with HOB raised with set up assist and supervision. Pt coughing several times throughout session and able to produce some secretions. Pt's incontinent brief was changed with pt rolling R and L (R-CGA, L-max). Pt was left supine with all needs met, restraints on and bed alarm set.   Therapy Documentation Precautions:  Precautions Precautions: Fall, Other (comment) Precaution Comments: supplemental O2 via [place], L hemi, impaired safety awareness, impulsive, NPO Restrictions Weight Bearing Restrictions:  No  Therapy/Group: Individual Therapy  Curtis Sites 10/02/2020, 6:07 AM

## 2020-10-02 NOTE — Progress Notes (Signed)
Speech Language Pathology Daily Session Note  Patient Details  Name: Brent [name] MRN: [id number] Date of Birth: 15-Aug-1965  Today's Date: 10/02/2020 SLP Individual Time: 1345-1420 SLP Individual Time Calculation (min): 35 min  Short Term Goals: Week 1: SLP Short Term Goal 1 (Week 1): Pt will consume trial of ice chips with s/s aspiration <50% of trials with mod cues for swallow strategies SLP Short Term Goal 2 (Week 1): Pt will recall functional information with 75% accuracy mod A cues SLP Short Term Goal 3 (Week 1): Pt will utilize compensatory strategies for speech intelligibility with mod A cues SLP Short Term Goal 4 (Week 1): Pt will ID 3-5 functional changes since CVA with mod A cues SLP Short Term Goal 5 (Week 1): Pt will complete simple problem solving tasks for daily routine with 75% accuracy mod A cues SLP Short Term Goal 6 (Week 1): Pt will increase sustained attention to 3 minutes provided mod A cues  Skilled Therapeutic Interventions:   Patient seen for skilled ST session focusing on cognitive and swallow function goals. Patient did not demonstrate any retention of information, as he repeatedly asked the same question over and over. He would request to sit up at that edge of bed and SLP informed him that we could not do that we would work on repositioning him in the bed. Patient performed oral care with toothbrush and SLP suctioning oral cavity afterwards. Patient then consumed a total of 3 small ice chips and exhibited immediate cough response on 2/3 ice chips, which lasted for 6-10 seconds in duration. Voice remained clear throughout. Patient asked SLP at beginning of session why he couldn't eat or drink and that "they told me I could eat what I wanted". SLP explained that although endoscopy which was earlier today, cleared him for PO's from their perspective, he was still at a very high risk of aspiration. Patient continues to benefit from skilled SLP intervention to maximize  cognitive and swallow function prior to discharge.  Pain Pain Assessment Pain Scale: Faces Faces Pain Scale: No hurt  Therapy/Group: Individual Therapy  Angela Nevin, MA, CCC-SLP Speech Therapy

## 2020-10-02 NOTE — Progress Notes (Signed)
Occupational Therapy Session Note  Patient Details  Name: Brent [name] MRN: [id number] Date of Birth: [birth year]  Today's Date: 10/02/2020 OT Missed Time: 60 Minutes Missed Time Reason: Other (comment) (MD hold for GI consult)   Skilled Therapeutic Interventions/Progress Updates:    Per PA via verbal order, hold tx till GI able to consult. Pt missed 60 min skilled OT. Follow up per POC/medically appropriate.  Therapy/Group: Individual Therapy  [name] 10/02/2020, 6:46 AM

## 2020-10-02 NOTE — Consult Note (Addendum)
Medical Consultation   Brent [name]  FTD:[id number]  DOB: 1965-07-04  DOA: 09/27/2020  PCP: Beatrix Fetters, MD    Requesting provider: Mariam Dollar PA    Reason for consultation:  Shortness of breath, Anemia  History of Present Illness:  55 year old male with past medical history of of COPD, diabetes mellitus type 2, hyperlipidemia, hypertension, obstructive sleep apnea, gastroesophageal reflux disease, cocaine abuse with recent hospitalization for acute stroke now currently a patient in the inpatient rehabilitation unit.  Of note, patient was recently hospitalized at [facility] from 7/27 until 8/12.  Patient had presented with sudden onset left-sided weakness and facial droop on 7/27.  Patient had a complicated initial presentation with shortness of breath and signs of sepsis, found to likely be secondary to aspiration pneumonia.  Patient also suffered from acute hypoxic respiratory failure.  Patient was managed with a 14-day course of intravenous antibiotics for the pneumonia while the stroke was managed in concert with neurology with antiplatelet therapy and statin therapy.  Speech therapy noted the patient was a aspiration risk and still continues to be and therefore PEG tube was placed on 8/9 for enteric feeds.  Patient was eventually discharged to inpatient rehab on 8/12.  Over the past 3 days nursing reports the patient has been exhibiting episodes of melena several times a day.  Additionally, patient has exhibited progressively decreasing hemoglobin and hematocrit with a hemoglobin of 14.1 on 8/9 which is down trended all the way to 8.3 on 8/16.  Today in particular, patient seems to have exhibited episodes of what appeared to be respiratory distress prompting a call to rapid response the evening of 8/16.  Patient underwent rapid evaluation including chest x-ray and EKG.  Chest x-ray revealed no evidence of recurrent pneumonia and EKG was essentially  unremarkable.  Due to these episodes of perceived shortness of breath as well as notable continued melena with anemia the hospitalist group has been called for medical consultation.  Per my discussion with the patient he says that he is "doing well."  Despite the episodes of melena patient denies abdominal pain or nausea.  Nursing states patient has not exhibited any bouts of vomiting.  Patient exhibits no other signs/sources of bleeding.  Patient reports that he has never suffered from gastrointestinal bleeding in the past.  Review of Systems:   Review of Systems  Respiratory:  Positive for cough.   Neurological:  Positive for focal weakness.  All other systems reviewed and are negative.    Past Medical History: Past Medical History:  Diagnosis Date   Aplastic anemia, unspecified (HCC)    Chest pain, unspecified    Chronic airway obstruction, not elsewhere classified    Cocaine substance abuse (HCC)    Neuropathy    Other and unspecified hyperlipidemia    Shortness of breath    Stroke (HCC) 06/17/2020   Tobacco use disorder    Type II or unspecified type diabetes mellitus without mention of complication, not stated as uncontrolled    Unspecified epilepsy without mention of intractable epilepsy    Unspecified essential hypertension     Past Surgical History: Past Surgical History:  Procedure Laterality Date   CARDIAC CATHETERIZATION  10/02/2010    Nonobstructive mild coronary plaque   IR GASTROSTOMY TUBE MOD SED  09/24/2020   KNEE SURGERY Bilateral 2018   Repair   PERFORATED VISCUS SURGERY     MULTIPLE FRACTURES  Allergies:  No Known Allergies   Social History:  reports that he has been smoking cigarettes. He has a 30.00 pack-year smoking history. He has never used smokeless tobacco. He reports current alcohol use. He reports that he does not use drugs.   Family History: Family History  Problem Relation Age of Onset   Heart attack Father    COPD Father     Coronary artery disease Mother        Physical Exam: Vitals:   10/01/20 1929 10/01/20 2025 10/01/20 2233 10/02/20 0020  BP: (!) 150/80 (!) 147/85 127/84 (!) 148/93  Pulse: 88 93 74 94  Resp: (!) 32 18 18 18   Temp:  98.3 F (36.8 C) 98.8 F (37.1 C) 98.2 F (36.8 C)  TempSrc:  Oral Oral Oral  SpO2: 96% 95% 93% 100%  Weight:      Height:        Constitutional: Lethargic arousable and oriented x3, no associated distress.   Skin: Somewhat increased skin pallor.  No rashes, no lesions, good skin turgor noted. Eyes: Pupils are equally reactive to light.  Increased conjunctival pallor without scleral icterus. ENMT: Dry membranes are moist. Posterior pharynx clear of any exudate or lesions. Normal dentition.   Neck: normal, supple, no masses, no thyromegaly Respiratory: clear to auscultation bilaterally, no wheezing, no crackles. Normal respiratory effort. No accessory muscle use.  Cardiovascular: Regular rate and rhythm, no murmurs / rubs / gallops. No extremity edema. 2+ pedal pulses. No carotid bruits.  Back:   Nontender without crepitus or deformity. Abdomen: PEG tube is noted to be in place underneath an abdominal binder.  Abdomen is soft and nontender.  No evidence of intra-abdominal masses.  Positive bowel sounds noted in all quadrants.   Musculoskeletal: Patient is apparently in soft point wrist restraints.  No joint deformity upper and lower extremities. Good ROM, no contractures. Normal muscle tone.  Neurologic: CN 2-12 grossly intact. Sensation intact, strength noted to be 5 out of 5 in all 4 extremities.  Patient is following all commands.  Patient is responsive to verbal stimuli.   Psychiatric: Patient presents as a normal mood with appropriate affect.  Patient seems to possess insight as to theircurrent situation.    Data reviewed:  I have personally reviewed following labs and imaging studies Labs:  CBC: Recent Labs  Lab 09/25/20 0638 09/26/20 1833 09/27/20 0540  09/30/20 0710 10/01/20 2021  WBC 13.5*  --  22.9* 12.6* 12.1*  NEUTROABS 9.9*  --   --  10.0*  --   HGB 13.3 12.4* 11.4* 8.5* 8.3*  HCT 40.0 36.8* 34.5* 26.2* 25.0*  MCV 96.4  --  97.2 99.2 97.3  PLT 268  --  276 279 280    Basic Metabolic Panel: Recent Labs  Lab 09/27/20 0540 09/30/20 0710 10/01/20 0531 10/01/20 2021  NA 145 149* 145 139  K 5.0 3.7 3.6 3.8  CL 112* 116* 116* 110  CO2 25 27 25 23   GLUCOSE 168* 84 88 159*  BUN 71* 35* 21* 16  CREATININE 1.40* 1.16 0.88 0.81  CALCIUM 9.9 9.3 8.9 8.8*   GFR Estimated Creatinine Clearance: 113.1 mL/min (by C-G formula based on SCr of 0.81 mg/dL). Liver Function Tests: Recent Labs  Lab 09/30/20 0710 10/01/20 2021  AST 19 16  ALT 15 14  ALKPHOS 53 52  BILITOT 0.6 0.4  PROT 6.0* 5.6*  ALBUMIN 2.8* 2.8*   No results for input(s): LIPASE, AMYLASE in the last 168 hours. No results  for input(s): AMMONIA in the last 168 hours. Coagulation profile No results for input(s): INR, PROTIME in the last 168 hours.  Cardiac Enzymes: No results for input(s): CKTOTAL, CKMB, CKMBINDEX, TROPONINI in the last 168 hours. BNP: Invalid input(s): POCBNP CBG: Recent Labs  Lab 10/01/20 0859 10/01/20 1138 10/01/20 1631 10/01/20 2112 10/02/20 0018  GLUCAP 113* 225* 87 156* 223*   D-Dimer No results for input(s): DDIMER in the last 72 hours. Hgb A1c No results for input(s): HGBA1C in the last 72 hours. Lipid Profile No results for input(s): CHOL, HDL, LDLCALC, TRIG, CHOLHDL, LDLDIRECT in the last 72 hours. Thyroid function studies No results for input(s): TSH, T4TOTAL, T3FREE, THYROIDAB in the last 72 hours.  Invalid input(s): FREET3 Anemia work up No results for input(s): VITAMINB12, FOLATE, FERRITIN, TIBC, IRON, RETICCTPCT in the last 72 hours. Urinalysis    Component Value Date/Time   COLORURINE YELLOW 09/11/2020 1110   APPEARANCEUR CLEAR 09/11/2020 1110   LABSPEC >1.046 (H) 09/11/2020 1110   PHURINE 5.0 09/11/2020 1110    GLUCOSEU NEGATIVE 09/11/2020 1110   HGBUR NEGATIVE 09/11/2020 1110   BILIRUBINUR NEGATIVE 09/11/2020 1110   KETONESUR NEGATIVE 09/11/2020 1110   PROTEINUR 30 (A) 09/11/2020 1110   NITRITE NEGATIVE 09/11/2020 1110   LEUKOCYTESUR NEGATIVE 09/11/2020 1110     Microbiology No results found for this or any previous visit (from the past 240 hour(s)).     Inpatient Medications:   Scheduled Meds:  arformoterol  15 mcg Nebulization BID   budesonide (PULMICORT) nebulizer solution  0.5 mg Nebulization BID   carBAMazepine  200 mg Per Tube Q6H   diatrizoate meglumine-sodium  30 mL Per Tube Once   feeding supplement (OSMOLITE 1.5 CAL)  355 mL Per Tube QID   feeding supplement (PROSource TF)  45 mL Per Tube BID   fenofibrate  160 mg Per Tube Daily   free water  230 mL Per Tube QID   guaiFENesin  10 mL Per Tube Q4H   insulin aspart  0-20 Units Subcutaneous Q4H   insulin aspart  2 Units Subcutaneous Q4H   insulin detemir  12 Units Subcutaneous BID   pantoprazole (PROTONIX) IV  40 mg Intravenous Q12H   polyethylene glycol  17 g Per Tube Daily   propranolol  10 mg Oral Daily   QUEtiapine  25 mg Per Tube QHS   rosuvastatin  20 mg Per Tube Daily   Continuous Infusions:  pantoprazole (PROTONIX) IV       Radiological Exams on Admission: DG Abd 1 View  Result Date: 10/01/2020 CLINICAL DATA:  Feeding tube placement. EXAM: ABDOMEN - 1 VIEW COMPARISON:  Gastrostomy tube placement exam yesterday. FINDINGS: Portable AP supine view of the abdomen obtained after the installation of 30 cc via indwelling gastrostomy tube. Gastrostomy tube balloon projects over the left upper quadrant. Contrast opacifies the stomach. There is no evidence of extravasation or leak. Again seen contrast in the colon from additional recent gastrostomy evaluations. Contrast seen left laterally on yesterday's exam is not included in the field of view. IMPRESSION: 1. Gastrostomy tube in the stomach. No evidence of extravasation  or leak. 2. Contrast in the colon from recent gastrostomy evaluations. Electronically Signed   By: Narda Rutherford M.D.   On: 10/01/2020 21:09   DG Abd 1 View  Result Date: 09/30/2020 CLINICAL DATA:  Peg placement. EXAM: ABDOMEN - 1 VIEW COMPARISON:  Radiograph yesterday. FINDINGS: Single frontal view of the abdomen obtained. Gastrostomy tube projects over the left upper quadrant in the  region of the stomach. There is contrast opacifying the gastric lumen. Type and amount of contrast is not specified. There is no evidence of extravasation or leak. Contrast is visualized about the left lateral abdomen but is likely external to the patient, and is lateral to the colon only partially included in the field of view. There is contrast in the colon from yesterday's exam. IMPRESSION: 1. Gastrostomy tube in the stomach with contrast opacifying the gastric lumen. No evidence of extravasation or leak. 2. Contrast about the left lateral abdomen is likely external to the patient, and only partially included in the field of view. 3. There is contrast within the left colon from yesterday's exam. Electronically Signed   By: Narda Rutherford M.D.   On: 09/30/2020 15:31   DG Chest Port 1 View  Result Date: 10/01/2020 CLINICAL DATA:  Acute respiratory distress EXAM: PORTABLE CHEST 1 VIEW COMPARISON:  09/23/2020 FINDINGS: Cardiac shadow is within normal limits. Feeding catheter has been removed. The lungs are well aerated bilaterally. Previously seen left-sided airspace opacities have resolved in the interval. No new focal abnormality is noted. IMPRESSION: No acute abnormality noted. Electronically Signed   By: Alcide Clever M.D.   On: 10/01/2020 19:56    Impression/Recommendations Principal Problem:   Acute blood loss anemia  Patient has exhibited progressively worsening anemia over the past 8 days with hemoglobin as high as 14.1 a days ago now down to 8.3 within the past several hours. This is accompanied by at least a  3-day history of melena according to nursing Primary team has ordered stool Hemoccults Patient has been initiated on aspirin and Plavix during his recent hospitalization for acute stroke, likely the culprit of his acute bleeding which is likely coming from the upper gastrointestinal tract. Temporarily holding aspirin and Plavix Initiating Protonix 80 mg IV x1 followed by 40 mg IV every 12 Obtaining CBCs every 6 hours We will proceed with packed red blood cell transfusion if hemoglobin drops below 7. Monitoring blood pressures closely Sending secure Message to gastroenterology to request gastroenterology consultation for consideration of endoscopic work-up Temporarily holding tube feeds in the meantime.  Active Problems:   Acute stroke due to ischemia [facility])  Currently in inpatient rehab for continued therapy Temporarily holding antiplatelet therapy as noted above Continuing statin therapy    Uncontrolled type 2 diabetes mellitus, with long-term current use of insulin (HCC)  Continuing current management with Levemir 12 units every 12 as well as NovoLog 2 units every 4 hours Continue Accu-Cheks every 4 hours as previously ordered    Mixed diabetic hyperlipidemia associated with type 2 diabetes mellitus (HCC)  Continue statin therapy    Essential hypertension  Continue propranolol    Malnutrition of moderate degree  Temporarily holding tube feeds due to ongoing melena as we give the opportunity for gastroenterology to consider endoscopic evaluation.    Thank you for this consultation.  Our [facility] hospitalist team will follow the patient with you.   Time Spent: 60 minutes  Marinda Elk M.D. [facility] 10/02/2020, 2:15 AM

## 2020-10-02 NOTE — Progress Notes (Signed)
Physical Therapy Session Note  Patient Details  Name: Brent [name] MRN: [id number] Date of Birth: 26-Aug-1965  Today's Date: 10/02/2020 PT Individual Time: 0076-2263 PT Individual Time Calculation (min): 30 min   Short Term Goals: Week 1:  PT Short Term Goal 1 (Week 1): Pt will tolerate upright, OOB sitting for at least 1 hour between therapy sessions. PT Short Term Goal 2 (Week 1): Pt will perform sit<>stands with +2 total assist PT Short Term Goal 3 (Week 1): Pt will complete bed<>chair transfers with +2 total assist PT Short Term Goal 4 (Week 1): Pt will demonstrate improved sitting balance with ability to maintain static sitting for at least 2 minutes without UE support  Skilled Therapeutic Interventions/Progress Updates:     Pt received supine in bed and agrees to therapy, perseverating on getting out of bed and getting out of wrist restraints. Pt naked so PT leaves to retrieve scrubs. Upon return nurse tech in room assisting pt off bedpan. PT assists with bed mobility to facilitate pericare and dressing. Pt performs bilateral rolling with minA/modA and bed pan removed with very loose, black, tarry stool. Pt performs additional rolling to don clean brief and pants. Supine to sit with maxA. Pt maintains sitting balance at EOB with minA/modA and loss of balance to the R, L and posteriorly. After donning shirt with maxA, pt performs squat pivot to WC with maxA +2. Pt handed off to PT in [facility].  Therapy Documentation Precautions:  Precautions Precautions: Fall, Other (comment) Precaution Comments: supplemental O2 via [place], L hemi, impaired safety awareness, impulsive, NPO Restrictions Weight Bearing Restrictions: No   Therapy/Group: Individual Therapy  Beau Fanny, PT, DPT 10/02/2020, 4:25 PM

## 2020-10-02 NOTE — Anesthesia Postprocedure Evaluation (Signed)
Anesthesia Post Note  Patient: Brent [name]  Procedure(s) Performed: ESOPHAGOGASTRODUODENOSCOPY (EGD) BIOPSY     Patient location during evaluation: PACU Anesthesia Type: MAC Level of consciousness: awake and alert Pain management: pain level controlled Vital Signs Assessment: post-procedure vital signs reviewed and stable Respiratory status: spontaneous breathing, nonlabored ventilation, respiratory function stable and patient connected to nasal cannula oxygen Cardiovascular status: stable and blood pressure returned to baseline Postop Assessment: no apparent nausea or vomiting Anesthetic complications: no   No notable events documented.  Last Vitals:  Vitals:   10/02/20 1920 10/02/20 1928  BP: 138/78   Pulse: 78   Resp: 18   Temp: 36.8 C   SpO2: 98% 91%    Last Pain:  Vitals:   10/02/20 1920  TempSrc: Oral  PainSc:                  [name]

## 2020-10-02 NOTE — Progress Notes (Signed)
Physical Therapy Session Note  Patient Details  Name: Brent [name] MRN: [id number] Date of Birth: Aug 15, 1965  Today's Date: 10/02/2020 PT Individual Time: 0920-0947 PT Individual Time Calculation (min): 27 min   Short Term Goals: Week 1:  PT Short Term Goal 1 (Week 1): Pt will tolerate upright, OOB sitting for at least 1 hour between therapy sessions. PT Short Term Goal 2 (Week 1): Pt will perform sit<>stands with +2 total assist PT Short Term Goal 3 (Week 1): Pt will complete bed<>chair transfers with +2 total assist PT Short Term Goal 4 (Week 1): Pt will demonstrate improved sitting balance with ability to maintain static sitting for at least 2 minutes without UE support  Skilled Therapeutic Interventions/Progress Updates:    Pt received sitting in TIS w/c as hand-off from Nora, PT. Pt agreeable to continue therapy session with this therapist. Per report from PT, no medical limitations to pt's participation in therapy. Transported to/from gym in w/c for time management and energy conservation. Anterior scoot towards front of seat with mod assist to decrease back support during dynamic reaching and sitting balance task of grasping small bean bags with R UE and then tossing to corn hole board - requires min assist and use of armrest support to prevent LOB when reaching moderately outside BOS - with fatigue pt often leaning on w/c backrest with difficulty bringing trunk forward again with pt compensating by pushing up with R UE and requiring min/mod assist. Transported back to room and assisted pt back to bed due to upcoming GI procedure. R squat pivot TIS w/c>EOB using R UE support on bedrail and +2 max assist for lifting/pivoting hips - pt demos ability to initiation powering up through B LEs to lift hips during transfer. Sit>supine with +2 max assist for trunk descent and B LE management onto bed. Pt quickly falling asleep once returned to supine. Pt left with needs in reach, [facility] elevated  >30degrees due swallowing concerns, bed alarm on, B UE soft wrist restraints in place per order, and telesitter in place.  Of note:  - during session pt frequently coughing indicating some possible aspiration (believe pt choking on saliva) - limited amount of backwards tilt in TIS w/c to prevent further risk of aspiration - pt also perseverates on desire to eat - reinforced education on swallowing precautions and NPO   Therapy Documentation Precautions:  Precautions Precautions: Fall, Other (comment) Precaution Comments: supplemental O2 via [place], L hemi, impaired safety awareness, impulsive, NPO Restrictions Weight Bearing Restrictions: No   Pain:  No reports of pain throughout session.   Therapy/Group: Individual Therapy  Ginny Forth , PT, DPT, NCS, CSRS 10/02/2020, 7:58 AM

## 2020-10-02 NOTE — Op Note (Addendum)
[facility] Patient Name: Brent [name] Procedure Date : 10/02/2020 MRN: [id number] Attending MD: Dub Amis. Tomasa Rand , MD Date of Birth: 01-Jun-1965 CSN: [id number] Age: 55 Admit Type: Inpatient Procedure:                Upper GI endoscopy Indications:              Acute post hemorrhagic anemia, Melena Providers:                Lorin Picket E. Tomasa Rand, MD, Roselie Awkward, RN, Alan Ripper, Technician, Loma Sender, CRNA Referring MD:              Medicines:                Monitored Anesthesia Care Complications:            No immediate complications. Estimated Blood Loss:     Estimated blood loss was minimal. Procedure:                Pre-Anesthesia Assessment:                           - Prior to the procedure, a History and Physical                            was performed, and patient medications and                            allergies were reviewed. The patient's tolerance of                            previous anesthesia was also reviewed. The risks                            and benefits of the procedure and the sedation                            options and risks were discussed with the patient.                            All questions were answered, and informed consent                            was obtained. Prior Anticoagulants: The patient has                            taken Plavix (clopidogrel), last dose was 1 day                            prior to procedure. ASA Grade Assessment: III - A                            patient with severe systemic disease. After  reviewing the risks and benefits, the patient was                            deemed in satisfactory condition to undergo the                            procedure.                           After obtaining informed consent, the endoscope was                            passed under direct vision. Throughout the                            procedure,  the patient's blood pressure, pulse, and                            oxygen saturations were monitored continuously. The                            [id number]-H190 ([id number]) Olympus endoscope was introduced                            through the mouth, and advanced to the third part                            of duodenum. The upper GI endoscopy was                            accomplished without difficulty. The patient                            tolerated the procedure well. Scope In: Scope Out: Findings:      The examined portions of the nasopharynx, oropharynx and larynx were       normal.      One benign-appearing, intrinsic mild stenosis was found at the       gastroesophageal junction. This stenosis measured 1.1 cm (inner       diameter) x less than one cm (in length). The stenosis was traversed.      The exam of the esophagus was otherwise normal.      A 2 cm hiatal hernia was present.      There was evidence of an intact gastrostomy with a patent G-tube present       on the anterior wall of the gastric body. There were several centimeters       of gastrostomy tube in the gastric lumen and the internal bumper was not       approximating the gastric wall. Under direct visualization, the       gastrostomy tube was pulled back and the external bumper was set to 7       cm, lightly abutting the skin of the abdomen. The internal bumper was       then lightly approximating the gastric wall, and rotated easily. The       mucosa around the gastrostomy site  was characterized by healthy       appearing mucosa.      A few dispersed small erosions with stigmata of recent bleeding (scant       hematin) were found in the gastric body and in the gastric antrum.       Biopsies were taken with a cold forceps for histology. Estimated blood       loss was minimal.      The exam of the stomach was otherwise normal.      The examined duodenum was normal. Impression:               - The examined portions of  the nasopharynx,                            oropharynx and larynx were normal.                           - Benign-appearing esophageal stenosis, likely                            related to acid reflux. Not dilated.                           - 2 cm hiatal hernia.                           - Intact gastrostomy with a patent G-tube present                            characterized by healthy appearing mucosa. Position                            of tube adjusted as mentioned above.                           - Erosive gastropathy with stigmata of recent                            bleeding. Biopsied. Unclear if this was a source of                            the bleeding, or if he bled from the gastrostomy                            site, suspect the latter.                           - No high risk lesions present.                           - Normal examined duodenum. Recommendation:           - Return patient to hospital ward for ongoing care.                           - Resume previous diet.                           -  Ok to resume Plavix (clopidogrel) today at prior                            dose.                           - Await pathology results.                           - Use a proton pump inhibitor PO daily indefinitely                            given history of upper GI bleed.                           - GI will sign off at this time. Please contact us                            if further questions. Procedure Code(s):        --- Professional ---                           [phone], Esophagogastroduodenoscopy, flexible,                            transoral; with biopsy, single or multiple Diagnosis Code(s):        --- Professional ---                           K22.2, Esophageal obstruction                           K44.9, Diaphragmatic hernia without obstruction or                            gangrene                           Z93.1, Gastrostomy status                           K92.2,  Gastrointestinal hemorrhage, unspecified                           D62, Acute posthemorrhagic anemia                           K92.1, Melena (includes Hematochezia) CPT copyright 2019 [employer] Association. All rights reserved. The codes documented in this report are preliminary and upon coder review may  be revised to meet current compliance requirements. Oluwanifemi Petitti E. Tomasa Randunningham, MD 10/02/2020 12:06:02 PM This report has been signed electronically. Number of Addenda: 0

## 2020-10-03 ENCOUNTER — Encounter (HOSPITAL_COMMUNITY): Payer: Self-pay | Admitting: Gastroenterology

## 2020-10-03 LAB — CBC
HCT: 23.3 % — ABNORMAL LOW (ref 39.0–52.0)
Hemoglobin: 7.8 g/dL — ABNORMAL LOW (ref 13.0–17.0)
MCH: 32.1 pg (ref 26.0–34.0)
MCHC: 33.5 g/dL (ref 30.0–36.0)
MCV: 95.9 fL (ref 80.0–100.0)
Platelets: 255 10*3/uL (ref 150–400)
RBC: 2.43 MIL/uL — ABNORMAL LOW (ref 4.22–5.81)
RDW: 15.5 % (ref 11.5–15.5)
WBC: 12.5 10*3/uL — ABNORMAL HIGH (ref 4.0–10.5)
nRBC: 0 % (ref 0.0–0.2)

## 2020-10-03 LAB — GLUCOSE, CAPILLARY
Glucose-Capillary: 111 mg/dL — ABNORMAL HIGH (ref 70–99)
Glucose-Capillary: 181 mg/dL — ABNORMAL HIGH (ref 70–99)
Glucose-Capillary: 203 mg/dL — ABNORMAL HIGH (ref 70–99)
Glucose-Capillary: 58 mg/dL — ABNORMAL LOW (ref 70–99)
Glucose-Capillary: 96 mg/dL (ref 70–99)

## 2020-10-03 LAB — SURGICAL PATHOLOGY

## 2020-10-03 MED ORDER — DEXTROSE 50 % IV SOLN
INTRAVENOUS | Status: AC
  Administered 2020-10-03: 12.5 g via INTRAVENOUS
  Filled 2020-10-03: qty 50

## 2020-10-03 MED ORDER — OSMOLITE 1.5 CAL PO LIQD
355.0000 mL | Freq: Four times a day (QID) | ORAL | Status: DC
  Administered 2020-10-03 – 2020-10-18 (×62): 355 mL
  Filled 2020-10-03 (×20): qty 474
  Filled 2020-10-03: qty 711
  Filled 2020-10-03 (×3): qty 474

## 2020-10-03 MED ORDER — HYDROCODONE-ACETAMINOPHEN 5-325 MG PO TABS
1.0000 | ORAL_TABLET | Freq: Four times a day (QID) | ORAL | Status: DC | PRN
  Administered 2020-10-03: 1
  Filled 2020-10-03: qty 1

## 2020-10-03 MED ORDER — DEXTROSE 50 % IV SOLN: 12.5000 g | INTRAVENOUS | Status: AC

## 2020-10-03 MED ORDER — PROSOURCE TF PO LIQD
45.0000 mL | Freq: Two times a day (BID) | ORAL | Status: DC
  Administered 2020-10-03 – 2020-10-18 (×31): 45 mL
  Filled 2020-10-03 (×31): qty 45

## 2020-10-03 NOTE — Progress Notes (Signed)
Resident was up most of the night agitated and restless, with hypoglycemic events. PRN Xanax given but situation was unchanged. Initiated protocol for adult hypoglycemic treatment. Pulled out IV assess and IV team consulted for a new peripheral IV assess.

## 2020-10-03 NOTE — Progress Notes (Signed)
Nutrition Follow-up  DOCUMENTATION CODES:   Non-severe (moderate) malnutrition in context of social or environmental circumstances  INTERVENTION:  Resume bolus tube feeds: Osmolite 1.5 cal formula via PEG with goal volume of 355 ml (1.5 cartons/ARCs) given QID.   Provide 45 ml Prosource TF BID per tube.   Provide 115 free water flush before and after each feeding administration   Tube feeding regimen provides 2210 kcal (100% of needs), 111 grams of protein, and 1086 ml of H2O.  Total free water: 2006 ml daily.  NUTRITION DIAGNOSIS:   Moderate Malnutrition related to social / environmental circumstances (CVA) as evidenced by moderate muscle depletion, percent weight loss, moderate fat depletion; ongoing  GOAL:   Patient will meet greater than or equal to 90% of their needs; to be met with TF  MONITOR:   Weight trends, Labs, I & O's, Diet advancement, Skin, TF tolerance  REASON FOR ASSESSMENT:   Consult Enteral/tube feeding initiation and management  ASSESSMENT:   Patient with PMH significant for HTN, HLD, DM, polysubstance use. Recently admitted to [facility] for acute ischemic/embolic stroke with L hemiparesis and severe dysphagia. Had PEG placed per IR on 8/9. Presents to CIR for further rehab therapy.  Pt with UGI bleeding with acute blood loss anemia 8/16. Tube feeds via PEG were stopped. Pt underwent EGD yesterday with results of no high risk lesion, but did show erosive gastropathy with stigmata of recent bleeding. Bleeding has resolved. PT continues on NPO status due to continued high aspiration risk. RD given verbal consent to restart tube feeds via PEG today. RD to reorder bolus tube feeding orders.   Labs and medications reviewed.   Diet Order:   Diet Order             Diet NPO time specified  Diet effective now                   EDUCATION NEEDS:   Not appropriate for education at this time  Skin:  Skin Assessment: Reviewed RN Assessment  Last BM:   8/17  Height:   Ht Readings from Last 1 Encounters:  09/27/20 6' (1.829 m)    Weight:   Wt Readings from Last 1 Encounters:  10/01/20 81.4 kg   BMI:  Body mass index is 24.34 kg/m.  Estimated Nutritional Needs:   Kcal:  2200-2400 kcal  Protein:  105-115 grams  Fluid:  >/= 2 L/day  Corrin Parker, MS, RD, LDN RD pager number/after hours weekend pager number on Amion.

## 2020-10-03 NOTE — Progress Notes (Signed)
Speech Language Pathology Daily Session Note  Patient Details  Name: Brent [name] MRN: [id number] Date of Birth: [birth year]  Today's Date: 10/03/2020 SLP Individual Time: 0930-1015 SLP Individual Time Calculation (min): 45 min  Short Term Goals: Week 1: SLP Short Term Goal 1 (Week 1): Pt will consume trial of ice chips with s/s aspiration <50% of trials with mod cues for swallow strategies SLP Short Term Goal 2 (Week 1): Pt will recall functional information with 75% accuracy mod A cues SLP Short Term Goal 3 (Week 1): Pt will utilize compensatory strategies for speech intelligibility with mod A cues SLP Short Term Goal 4 (Week 1): Pt will ID 3-5 functional changes since CVA with mod A cues SLP Short Term Goal 5 (Week 1): Pt will complete simple problem solving tasks for daily routine with 75% accuracy mod A cues SLP Short Term Goal 6 (Week 1): Pt will increase sustained attention to 3 minutes provided mod A cues  Skilled Therapeutic Interventions:   Patient seen for skilled ST session focusing on swallow and cognitive function. Patient exhibited some coughing with expectoration of secretions requiring suctioning prior to PO's. He consumed puree solids (chocolate pudding) without any overt s/s aspiration or penetration but with suspected swallow initiation delay. No oral residuals remained post swallows and voice remained clear throughout. Patient continues to repeatedly ask same questions but did exhibit some retention of information during session when cued to restate. He remains fixated on "they told me I can eat whatever I want" and SLP wrote down note of plans for swallow test next date and patient able to read, so it was posted on wall. Plan is for MBS next date to assess swallow function and determine if any PO's can be recommended. Patient continues to benefit from skilled SLP intervention to maximize cognition and swallow function prior to discharge.  Pain Pain Assessment Pain  Scale: 0-10 Faces Pain Scale: No hurt  Therapy/Group: Individual Therapy  Angela Nevin, MA, CCC-SLP Speech Therapy

## 2020-10-03 NOTE — Progress Notes (Signed)
Hypoglycemic Event  CBG: 58  Treatment: D50 19ml (12.5g)  Symptoms: none  Follow-up CBG: Time:0450 CBG Result: 96  Possible Reasons for Event: unknown  Comments/MD notified: charge nurse/MD Notified.    Marny Lowenstein

## 2020-10-03 NOTE — Progress Notes (Signed)
Physical Therapy Session Note  Patient Details  Name: Brent [name] MRN: [id number] Date of Birth: 1965/11/08  Today's Date: 10/03/2020 PT Individual Time: 1300-1400 PT Individual Time Calculation (min): 60 min   Short Term Goals: Week 1:  PT Short Term Goal 1 (Week 1): Pt will tolerate upright, OOB sitting for at least 1 hour between therapy sessions. PT Short Term Goal 2 (Week 1): Pt will perform sit<>stands with +2 total assist PT Short Term Goal 3 (Week 1): Pt will complete bed<>chair transfers with +2 total assist PT Short Term Goal 4 (Week 1): Pt will demonstrate improved sitting balance with ability to maintain static sitting for at least 2 minutes without UE support  Skilled Therapeutic Interventions/Progress Updates:     Patient in bed with nursing providing meds via PEG tube upon PT arrival. Patient alert and requested to get out of the room at beginning of session. Patient remained calm without agitated behaviors throughout session. He stated one inappropriate comment that he was able to recognize as inappropriate when brought to his attention, and responded well, >75% of the time, to clear directions and boundaries for what was going to happen and when to begin to manage impulsivity throughout session.   Vitals: Patient maintained on 3L/min O2 via [place] throughout session, SPO2 96-100%, HR 70s-80s throughout with intermittent monitoring Pain: Patient denied pain and demonstrated no outward indicators of pain throughout session.   Therapeutic Activity: Bed Mobility: Patient performed supine to sit with min A +2 for facilitation of L shoulder and upper extremity and L lower extremity with max multimodal cues and increased time due to deficits in attention and initiation. Patient transitioned from supine to side-lying before sitting and HOB was elevated to 30 degrees to reduce risk of stomach upset following use of PEG tube. Patient was incontinent of bladder sitting EOB during NMR,  see below.  Transfers: Patient performed sit to/from stand from the bed with max A from PT and min A +2 to facilitation gluteal activation when boosting up x2, second transfer transitioned to stand pivot to bariatric drop arm BSC initiated by lateral weight shifts and max A for L foot advancement with stepping. Patient unable to void or have a BM on BSC, performed sit to stand, as above, and brief and shorts were pulled over his hips with total A. Attempted stand pivot, as above, x2 to transfer to the TIS w/c and patient spontaneously initiated sitting each trial. Performed squat pivot BSC>TIS w/c with max-total A +2 for energy/time management due to patient fatigue.    Wheelchair Mobility:  Patient was transported in the w/c with total A throughout session for energy conservation and time management.   Neuromuscular Re-ed: Patient performed the following dynamic sitting balance and standing activities to improve independence with functional tasks: Sitting balance >5 min with min A-close supervision EOB: -static sitting with single upper extremity support, progressing to no upper extremity support with mod multimodal cues for erect posture due to forward flexed posture -handed patient shorts and asked him to don them, patient appropriately reached forward, 3-4 inches from the floor, x3 in attempts to place his shorts on his feet before asking for assistance, shorts set-up in front of his feet and patient able to maintain sitting balance while threading his feet through his shorts with mod A for L foot placement Functional transfers with standing balance in // bars with mod-max A +2: -reciprocal scooting to edge of seat x3 with mod A and min cues  -sit  to/stand x3 with PT blocking L knee to prevent buckling, manually facilitating forward weight shift and gluteal activation for boosting up, mirror with orange band at top used as external visual cue for erect posture in standing -static standing balance  3x30-60 sec with B upper extremity support for weight bearing through L arm, progressed to mod A in standing and would regress with external distraction, able to redirect 2/4 occurences  Patient in TIS w/c in the room at end of session with breaks locked, soft wrist restraints secured, seat belt alarm set, Telesitter in place, and all needs within reach. Nursing made aware that patient would be up in the chair for 30 mins until next therapy session.   Therapy Documentation Precautions:  Precautions Precautions: Fall, Other (comment) Precaution Comments: supplemental O2 via [place], L hemi, impaired safety awareness, impulsive, NPO Restrictions Weight Bearing Restrictions: No    Therapy/Group: Individual Therapy  Tremaine Fuhriman L Lara Palinkas PT, DPT  10/03/2020, 4:32 PM

## 2020-10-03 NOTE — Progress Notes (Signed)
PROGRESS NOTE        PATIENT DETAILS Name: Brent [name] Age: 55 y.o. Sex: male Date of Birth: 11/01/1965 Admit Date: 09/27/2020 Admitting Physician Ranelle Oyster, MD KGS:[id number], [name], MD  Brief Narrative: Patient is a 55 y.o. male with history of COPD, DM-2, HLD, HTN-currently at CIR-after he recently had a acute CVA with left-sided [facility] consulted for upper GI bleeding with acute blood loss anemia.  DVT Prophylaxis :  Place TED hose Start: 09/27/20 1413  Procedures: 8/17>> EGD: Erosive gastropathy with stigmata of recent bleeding.  No high risk lesions.  Subjective: Much more calm and cooperative this morning.  Discussed with RN-no obvious bleeding episodes overnight.  Hemoglobin has stabilized.   Assessment/Plan: UGI bleeding with acute blood loss anemia: Upper GI bleeding seems to have resolved-hemoglobin stable-EGD on 8/17 showed no high risk lesions-did show erosive gastropathy with stigmata of recent bleeding.  Per GI-okay to resume Plavix-maintain on PPI indefinitely.  No further recommendations-continue to follow CBC periodically.  Please reconsult either [facility] service for the GI service if patient has recurrent issues with GI bleeding.  I will sign off.   Recent CVA: Continues to have mild left-sided deficits-was on aspirin/Plavix-but given GI bleeding-should be okay just to put on monotherapy with Plavix.  Reviewed neurology consultation done at [facility]-recommendations were for aspirin/Plavix x3 weeks only.  COPD: Not in exacerbation  DM-2: Continues to have episodes of hypoglycemia-suggest change Levemir to once a day dosing.  Continue to follow CBC-I will defer to the primary service.  Recent Labs    10/03/20 0037 10/03/20 0355 10/03/20 0458  GLUCAP 111* 58* 96     Which we will sign off-see recommendations above.  Diet: Diet Order             Diet NPO time specified  Diet effective now                     Code Status: Full code   Family Communication: None at bedside  Disposition Plan: Per primary service.   Antimicrobial agents: Anti-infectives (From admission, onward)    None        Time spent: 25-minutes-Greater than 50% of this time was spent in counseling, explanation of diagnosis, planning of further management, and coordination of care.  MEDICATIONS: Scheduled Meds:  arformoterol  15 mcg Nebulization BID   budesonide (PULMICORT) nebulizer solution  0.5 mg Nebulization BID   carBAMazepine  200 mg Per Tube Q6H   clopidogrel  75 mg Per Tube Daily   diatrizoate meglumine-sodium  30 mL Per Tube Once   fenofibrate  160 mg Per Tube Daily   free water  230 mL Per Tube QID   guaiFENesin  10 mL Per Tube Q4H   insulin aspart  0-20 Units Subcutaneous Q4H   insulin detemir  8 Units Subcutaneous BID   pantoprazole (PROTONIX) IV  40 mg Intravenous Q12H   polyethylene glycol  17 g Per Tube Daily   propranolol  10 mg Oral Daily   QUEtiapine  25 mg Per Tube QHS   rosuvastatin  20 mg Per Tube Daily   Continuous Infusions:   PRN Meds:.acetaminophen (TYLENOL) oral liquid 160 mg/5 mL, ALPRAZolam, HYDROcodone-acetaminophen, ipratropium-albuterol, melatonin, nitroGLYCERIN   PHYSICAL EXAM: Vital signs: Vitals:   10/02/20 1920 10/02/20 1928 10/02/20 2318 10/03/20 0315  BP: 138/78  Marland Kitchen)  147/83 (!) 143/81  Pulse: 78  87 80  Resp: 18  18 18   Temp: 98.3 F (36.8 C)  98.9 F (37.2 C) 98.5 F (36.9 C)  TempSrc: Oral     SpO2: 98% 91% 93% 97%  Weight:      Height:       Filed Weights   09/27/20 1615 09/29/20 0410 10/01/20 0412  Weight: 81.3 kg 82.1 kg 81.4 kg   Body mass index is 24.34 kg/m.   Gen Exam:Alert awake-not in any distress HEENT:atraumatic, normocephalic Chest: B/L clear to auscultation anteriorly CVS:S1S2 regular Abdomen:soft non tender, non distended Extremities:no edema Neurology: Mild left-sided deficits. Skin: no rash   I have personally  reviewed following labs and imaging studies  LABORATORY DATA: CBC: Recent Labs  Lab 09/30/20 0710 10/01/20 2021 10/02/20 0322 10/02/20 0712 10/02/20 1431 10/03/20 0514  WBC 12.6* 12.1* 12.8* 13.5* 11.1* 12.5*  NEUTROABS 10.0*  --   --   --   --   --   HGB 8.5* 8.3* 7.6* 7.6* 7.6* 7.8*  HCT 26.2* 25.0* 22.8* 23.1* 23.2* 23.3*  MCV 99.2 97.3 96.2 96.7 96.7 95.9  PLT 279 280 261 271 268 255     Basic Metabolic Panel: Recent Labs  Lab 09/27/20 0540 09/30/20 0710 10/01/20 0531 10/01/20 2021  NA 145 149* 145 139  K 5.0 3.7 3.6 3.8  CL 112* 116* 116* 110  CO2 25 27 25 23   GLUCOSE 168* 84 88 159*  BUN 71* 35* 21* 16  CREATININE 1.40* 1.16 0.88 0.81  CALCIUM 9.9 9.3 8.9 8.8*     GFR: Estimated Creatinine Clearance: 113.1 mL/min (by C-G formula based on SCr of 0.81 mg/dL).  Liver Function Tests: Recent Labs  Lab 09/30/20 0710 10/01/20 2021  AST 19 16  ALT 15 14  ALKPHOS 53 52  BILITOT 0.6 0.4  PROT 6.0* 5.6*  ALBUMIN 2.8* 2.8*    No results for input(s): LIPASE, AMYLASE in the last 168 hours. No results for input(s): AMMONIA in the last 168 hours.  Coagulation Profile: No results for input(s): INR, PROTIME in the last 168 hours.  Cardiac Enzymes: No results for input(s): CKTOTAL, CKMB, CKMBINDEX, TROPONINI in the last 168 hours.  BNP (last 3 results) No results for input(s): PROBNP in the last 8760 hours.  Lipid Profile: No results for input(s): CHOL, HDL, LDLCALC, TRIG, CHOLHDL, LDLDIRECT in the last 72 hours.  Thyroid Function Tests: No results for input(s): TSH, T4TOTAL, FREET4, T3FREE, THYROIDAB in the last 72 hours.  Anemia Panel: No results for input(s): VITAMINB12, FOLATE, FERRITIN, TIBC, IRON, RETICCTPCT in the last 72 hours.  Urine analysis:    Component Value Date/Time   COLORURINE YELLOW 09/11/2020 1110   APPEARANCEUR CLEAR 09/11/2020 1110   LABSPEC >1.046 (H) 09/11/2020 1110   PHURINE 5.0 09/11/2020 1110   GLUCOSEU NEGATIVE  09/11/2020 1110   HGBUR NEGATIVE 09/11/2020 1110   BILIRUBINUR NEGATIVE 09/11/2020 1110   KETONESUR NEGATIVE 09/11/2020 1110   PROTEINUR 30 (A) 09/11/2020 1110   NITRITE NEGATIVE 09/11/2020 1110   LEUKOCYTESUR NEGATIVE 09/11/2020 1110    Sepsis Labs: Lactic Acid, Venous    Component Value Date/Time   LATICACIDVEN 1.2 09/11/2020 0855    MICROBIOLOGY: No results found for this or any previous visit (from the past 240 hour(s)).  RADIOLOGY STUDIES/RESULTS: DG Abd 1 View  Result Date: 10/01/2020 CLINICAL DATA:  Feeding tube placement. EXAM: ABDOMEN - 1 VIEW COMPARISON:  Gastrostomy tube placement exam yesterday. FINDINGS: Portable AP supine view of  the abdomen obtained after the installation of 30 cc via indwelling gastrostomy tube. Gastrostomy tube balloon projects over the left upper quadrant. Contrast opacifies the stomach. There is no evidence of extravasation or leak. Again seen contrast in the colon from additional recent gastrostomy evaluations. Contrast seen left laterally on yesterday's exam is not included in the field of view. IMPRESSION: 1. Gastrostomy tube in the stomach. No evidence of extravasation or leak. 2. Contrast in the colon from recent gastrostomy evaluations. Electronically Signed   By: Narda Rutherford M.D.   On: 10/01/2020 21:09   DG Chest Port 1 View  Result Date: 10/01/2020 CLINICAL DATA:  Acute respiratory distress EXAM: PORTABLE CHEST 1 VIEW COMPARISON:  09/23/2020 FINDINGS: Cardiac shadow is within normal limits. Feeding catheter has been removed. The lungs are well aerated bilaterally. Previously seen left-sided airspace opacities have resolved in the interval. No new focal abnormality is noted. IMPRESSION: No acute abnormality noted. Electronically Signed   By: Alcide Clever M.D.   On: 10/01/2020 19:56     LOS: 6 days   Jeoffrey Massed, MD  [facility]    To contact the attending provider between 7A-7P or the covering provider during after hours  7P-7A, please log into the web site [email or web address] and access using universal [place] password for that web site. If you do not have the password, please call the hospital operator.  10/03/2020, 9:48 AM

## 2020-10-03 NOTE — Progress Notes (Signed)
Physical Therapy Session Note  Patient Details  Name: Brent [name] MRN: [id number] Date of Birth: 02/29/1965  Today's Date: 10/03/2020 PT Individual Time: 9791-5041 PT Individual Time Calculation (min): 26 min   Short Term Goals: Week 1:  PT Short Term Goal 1 (Week 1): Pt will tolerate upright, OOB sitting for at least 1 hour between therapy sessions. PT Short Term Goal 2 (Week 1): Pt will perform sit<>stands with +2 total assist PT Short Term Goal 3 (Week 1): Pt will complete bed<>chair transfers with +2 total assist PT Short Term Goal 4 (Week 1): Pt will demonstrate improved sitting balance with ability to maintain static sitting for at least 2 minutes without UE support  Skilled Therapeutic Interventions/Progress Updates:    Pt sitting in TIS w/c to start session. Awake and agreeable to therapy. On 2.5L O2, connected to 3L portable tank throughout session. Pt does not report of any pain during session. Removed bilateral wrist restraints and transported pt to main rehab hallway. Worked on sit<>stands in hallway with max/totalA using R hand rail and L knee block with therapist on stool assisting in powering to rise at the distal femur, hips, and trunk. Required max facilitation to achieve full upright with multiple failed attempts. Once full upright was reached, able to only maintain for very brief periods of time for 2-5 seconds each time. Of note, he demonstrates increased L flexor synergy pattern in the LLE resulting in a "single leg stand" on R leg so he also required stabilization of LLE for foot flat to prevent this for each stand. RN requesting pt return to bed at end of session for bolus feedings. Completed squat<>pivot transfer with maxA and L knee block from TIS w/c to EOB, using bed rail to assist in pulling. Required +2 maxA for sit>supine and remained supine in bed at end of session with HOB raised. Reapplied bilateral wrist restraints at end of session and connected to wall O2 at  2.5L. All needs met.   Therapy Documentation Precautions:  Precautions Precautions: Fall, Other (comment) Precaution Comments: supplemental O2 via [place], L hemi, impaired safety awareness, impulsive, NPO Restrictions Weight Bearing Restrictions: No General:    Therapy/Group: Individual Therapy  Alger Simons 10/03/2020, 7:48 AM

## 2020-10-03 NOTE — Progress Notes (Signed)
Occupational Therapy Session Note  Patient Details  Name: Brent [name] MRN: [id number] Date of Birth: [birth year]  Today's Date: 10/03/2020 OT Individual Time: 5427-0623 OT Individual Time Calculation (min): 80 min    Short Term Goals: Week 1:  OT Short Term Goal 1 (Week 1): Pt will maintain sustained attention to basic selfcare/grooming tasks with no more than mod instructional cueing for re-direction. OT Short Term Goal 2 (Week 1): Pt will maintain static sitting balance EOB with supervision for 4 mins in preparation for selfcare tasks. OT Short Term Goal 3 (Week 1): Pt will complete UB bathing with min assist for two consecutive sessions. OT Short Term Goal 4 (Week 1): Pt will complete sit to stand with total +2 (pt 30%) during LB bathing or dressing.  Skilled Therapeutic Interventions/Progress Updates:    Patient in bed, alert - requires redirection for safe sequencing.  Wrist restraints removed for session, he is able to sit in long sit at bed level with CS/min a.  Brief and bed soaked with urine - required max A/dep to change bed pad, complete hygiene and donn clean brief, pants, slipper socks.   Rolling in bed with min A and cues for direction/body in space.  Supine to sitting edge of bed with min A.  CS for unsupported sitting.   Sit to stand at edge of bed max A of one - dependent for pants up in stance.   Sit pivot transfer bed to w/c with max A of one and stand by of 2nd.   Completed UB bathing with mod A, OH shirt max A.  To therapy gym but returned due to patient request to void.  Moved to standing position max A of one with +2 completing chair swap - unable to void on commode.  Dependent for hygiene but had bowel accident during transfer back to bed - max A SPT, max A sit to supine and dependent to change brief.  He remained in bed at close of session, wrist restraints secured, bed alarm set, telesitter in place, call bell in reach of right hand.    Therapy  Documentation Precautions:  Precautions Precautions: Fall, Other (comment) Precaution Comments: supplemental O2 via [place], L hemi, impaired safety awareness, impulsive, NPO Restrictions Weight Bearing Restrictions: No  Therapy/Group: Individual Therapy  Barrie Lyme 10/03/2020, 7:39 AM

## 2020-10-03 NOTE — Progress Notes (Signed)
Patient ID: Brent [name], male   DOB: 10-Apr-1965, 55 y.o.   MRN: [id number]  Spoke with Margaret-Mom and karen-girlfriend via telephone to update regarding team conference goals min-mod level and target discharge 9/9.Both are questioning if he can be managed at home at the level he will be at. Both have family dynamic issues and question if get along. Mom wants him safe and to have good care at home. Clydie Braun feels she can take care of him like last time, but struggles with eating issues. She feels if he is at home he will eat. Pt can manipulate karen to do what he wants. Encouraged family to discuss plan and are aware of option if can't take home is [facility]. Due to his medicaid and drug history he will be difficult to find a place to take him. Will work on the best plan for pt.

## 2020-10-03 NOTE — Progress Notes (Signed)
[facility] PHYSICAL MEDICINE & REHABILITATION PROGRESS NOTE  Subjective/Complaints: Appreciate GI endoscopy and recommendations yesterday. Ok to resume Plavix (started today) and maintain on PPI indefinitely (currently getting IV). Appreciate Dr. Jerral Ralph following as well. Patient expresses no complaints this morning  ROS: Limited due to cognition.  Objective: Vital Signs: Blood pressure (!) 143/81, pulse 80, temperature 98.5 F (36.9 C), resp. rate 18, height 6' (1.829 m), weight 81.4 kg, SpO2 97 %. DG Abd 1 View  Result Date: 10/01/2020 CLINICAL DATA:  Feeding tube placement. EXAM: ABDOMEN - 1 VIEW COMPARISON:  Gastrostomy tube placement exam yesterday. FINDINGS: Portable AP supine view of the abdomen obtained after the installation of 30 cc via indwelling gastrostomy tube. Gastrostomy tube balloon projects over the left upper quadrant. Contrast opacifies the stomach. There is no evidence of extravasation or leak. Again seen contrast in the colon from additional recent gastrostomy evaluations. Contrast seen left laterally on yesterday's exam is not included in the field of view. IMPRESSION: 1. Gastrostomy tube in the stomach. No evidence of extravasation or leak. 2. Contrast in the colon from recent gastrostomy evaluations. Electronically Signed   By: Narda Rutherford M.D.   On: 10/01/2020 21:09   DG Chest Port 1 View  Result Date: 10/01/2020 CLINICAL DATA:  Acute respiratory distress EXAM: PORTABLE CHEST 1 VIEW COMPARISON:  09/23/2020 FINDINGS: Cardiac shadow is within normal limits. Feeding catheter has been removed. The lungs are well aerated bilaterally. Previously seen left-sided airspace opacities have resolved in the interval. No new focal abnormality is noted. IMPRESSION: No acute abnormality noted. Electronically Signed   By: Alcide Clever M.D.   On: 10/01/2020 19:56   Recent Labs    10/02/20 1431 10/03/20 0514  WBC 11.1* 12.5*  HGB 7.6* 7.8*  HCT 23.2* 23.3*  PLT 268 255    Recent Labs    10/01/20 0531 10/01/20 2021  NA 145 139  K 3.6 3.8  CL 116* 110  CO2 25 23  GLUCOSE 88 159*  BUN 21* 16  CREATININE 0.88 0.81  CALCIUM 8.9 8.8*    Intake/Output Summary (Last 24 hours) at 10/03/2020 1053 Last data filed at 10/03/2020 0744 Gross per 24 hour  Intake 300 ml  Output --  Net 300 ml         Physical Exam: BP (!) 143/81 (BP Location: Right Arm)   Pulse 80   Temp 98.5 F (36.9 C)   Resp 18   Ht 6' (1.829 m)   Wt 81.4 kg   SpO2 97%   BMI 24.34 kg/m  Gen: no distress, normal appearing HEENT: oral mucosa pink and moist, NCAT Cardio: Reg rate Chest: normal effort, normal rate of breathing Abd: soft, non-distended Ext: no edema Psych: pleasant, normal affect GI: Non-distended.  BS +.  + PEG. Skin: Warm and dry.  Intact. Psych: Impulsive.  Hyperactive. Musc: No edema in extremities.  No tenderness in extremities. Neuro: Alert Dysarthria, unchanged LUE: Shoulder abduction, elbow flexion/extension 1/5, handgrip 3/5, improving LLE: 1/5 proximal distal  Assessment/Plan: 1. Functional deficits which require 3+ hours per day of interdisciplinary therapy in a comprehensive inpatient rehab setting. Physiatrist is providing close team supervision and 24 hour management of active medical problems listed below. Physiatrist and rehab team continue to assess barriers to discharge/monitor patient progress toward functional and medical goals   Care Tool:  Bathing    Body parts bathed by patient: Chest, Left arm   Body parts bathed by helper: Right arm, Buttocks     Bathing  assist Assist Level: 2 Helpers     Upper Body Dressing/Undressing Upper body dressing   What is the patient wearing?: Pull over shirt, Hospital gown only    Upper body assist Assist Level: Dependent - Patient 0%    Lower Body Dressing/Undressing Lower body dressing      What is the patient wearing?: Incontinence brief     Lower body assist Assist for lower  body dressing: 2 Helpers     Toileting Toileting    Toileting assist Assist for toileting: Dependent - Patient 0%     Transfers Chair/bed transfer  Transfers assist  Chair/bed transfer activity did not occur: Safety/medical concerns  Chair/bed transfer assist level: 2 Helpers (squat pivot)     Locomotion Ambulation   Ambulation assist   Ambulation activity did not occur: Safety/medical concerns          Walk 10 feet activity   Assist  Walk 10 feet activity did not occur: Safety/medical concerns        Walk 50 feet activity   Assist Walk 50 feet with 2 turns activity did not occur: Safety/medical concerns         Walk 150 feet activity   Assist Walk 150 feet activity did not occur: Safety/medical concerns         Walk 10 feet on uneven surface  activity   Assist Walk 10 feet on uneven surfaces activity did not occur: Safety/medical concerns         Wheelchair     Assist Will patient use wheelchair at discharge?: Yes Type of Wheelchair: Manual Wheelchair activity did not occur: Safety/medical concerns         Wheelchair 50 feet with 2 turns activity    Assist    Wheelchair 50 feet with 2 turns activity did not occur: Safety/medical concerns       Wheelchair 150 feet activity     Assist  Wheelchair 150 feet activity did not occur: Safety/medical concerns        Medical Problem List and Plan: 1.   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 Hx of prior stroke 6/22.  Continue CIR 2.  Impaired mobility: Discontinue Lovenox given bleeding from PEG site.              -antiplatelet therapy: Continue Aspirin 81 mg daily and Plavix 75 mg dailyx 3 weeks 3. Pain: d/c IV Dilaudid. Decrease Hydrocodone to 1 tablet q6H  needed  Appears controlled on 8/18 4. Anxiety: Continue Xanax as needed. Increased seroquel to 25mg  HS 5. Neuropsych: This patient is not capable of making decisions on his 1.40 on 8/12 behalf.  Telemetry sitter for safety, continue  Wrist restraints for safety, continue 6. Skin/Wound Care: Routine skin checks 7. 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 -SLP follow up, advance diet as tolerated 9. Cocaine/tobacco abuse.UDS positive cocaine.  Counseled on appropriate 10.  Diabetes mellitus.  Hemoglobin A1c 6.4.   Diabetic teaching when appropriate  Levemir decreased to 8 on 8/17  Discontinue Novolog 11.  Hyperlipidemia.  Crestor/fenofibrate 12.Aspiration pneumonia.  Completed 14 days antibiotic 13.  Constipation.  MiraLAX 14.  Acute blood loss anemia  Hemoglobin 7.6 on 8/17, up to 7.8 on 8/18, plavix resumed as per GI recommendations. Continue PPI indefinitely. Repeat  CBC periodically.   Endoscopy shows no ulcers 15.  Leukocytosis  WBCs 13.5 on 8/17, improving 16.  AKI: Resolved  Creatinine 0.81 on 8/16  IVF initiated, IVF DC'd 17. Hypernatremia: Sodium 139 on 8/16, improved 18. HTN: started propanolol 10mg  daily.  Labile on 8/18, monitor for trend  LOS: 6 days A FACE TO FACE EVALUATION WAS PERFORMED  9/18 Butler Vegh 10/03/2020, 10:53 AM

## 2020-10-04 ENCOUNTER — Inpatient Hospital Stay (HOSPITAL_COMMUNITY): Payer: Medicaid Other

## 2020-10-04 DIAGNOSIS — K922 Gastrointestinal hemorrhage, unspecified: Secondary | ICD-10-CM

## 2020-10-04 LAB — GLUCOSE, CAPILLARY
Glucose-Capillary: 116 mg/dL — ABNORMAL HIGH (ref 70–99)
Glucose-Capillary: 147 mg/dL — ABNORMAL HIGH (ref 70–99)
Glucose-Capillary: 154 mg/dL — ABNORMAL HIGH (ref 70–99)
Glucose-Capillary: 187 mg/dL — ABNORMAL HIGH (ref 70–99)
Glucose-Capillary: 205 mg/dL — ABNORMAL HIGH (ref 70–99)
Glucose-Capillary: 87 mg/dL (ref 70–99)

## 2020-10-04 MED ORDER — HYDROCODONE-ACETAMINOPHEN 5-325 MG PO TABS
1.0000 | ORAL_TABLET | Freq: Three times a day (TID) | ORAL | Status: DC | PRN
  Administered 2020-10-04 – 2020-10-07 (×6): 1
  Filled 2020-10-04 (×6): qty 1

## 2020-10-04 MED ORDER — PANTOPRAZOLE SODIUM 40 MG PO PACK
40.0000 mg | PACK | Freq: Two times a day (BID) | ORAL | Status: DC
  Administered 2020-10-04 – 2020-10-18 (×28): 40 mg
  Filled 2020-10-04 (×8): qty 20

## 2020-10-04 NOTE — Progress Notes (Signed)
Physical Therapy Session Note  Patient Details  Name: Brent [name] MRN: [id number] Date of Birth: 02/16/1966  Today's Date: 10/04/2020 PT Individual Time: 1401-1502 PT Individual Time Calculation (min): 61 min   Short Term Goals: Week 1:  PT Short Term Goal 1 (Week 1): Pt will tolerate upright, OOB sitting for at least 1 hour between therapy sessions. PT Short Term Goal 2 (Week 1): Pt will perform sit<>stands with +2 total assist PT Short Term Goal 3 (Week 1): Pt will complete bed<>chair transfers with +2 total assist PT Short Term Goal 4 (Week 1): Pt will demonstrate improved sitting balance with ability to maintain static sitting for at least 2 minutes without UE support  Skilled Therapeutic Interventions/Progress Updates:  Pt received supine in bed, wrist restraints on and telesitter in place. Pt inquired to call his wife, Clydie Braun, but agreed to participate in therapy first. Supine <>sit EOB w/max A for BLE and trunk management, heavy verbal cues for motor planning and sequencing and pt able to initiate movement. At EOB, min A for midline orientation and stabilizing, pt perseverating on calling Clydie Braun. Squat pivot from EOB to TIS w/2 helpers (max A), tactile cues for B glute activation and heavy verbal cues for sequencing and planning. Pt able to assist in lifting LLE to footrest in TIS, in which he was very proud. Pt transported to main gym w/total A in TIS and performed 4 sit <>stands in // bars w/max A x2. Verbal cues for sequencing and time allowed for initiation. Pt able to bear weight through LUE in standing on bar and attempted to take single step w/LLE and max A. Pt very distracted and difficult to reorient, as he fixated on male therapist in gym and perseverated on calling Clydie Braun and "making a buddy". Pt transported back to room w/total A and performed squat pivot w/total A x2 2/2 fatigue and emotional lability. Pt able to recall [name]'s phone number and spoke to her while sitting EOB  without UE support x5 minutes, mod A to maintain midline of trunk and tactile cues to facilitate extension. While on phone, pt  broke down into tears and verbalized his need "for a buddy". Provided emotional support and encouraged pt to rest. Sit EOB <>supine w/mod A for BLE management. Scoot to [facility] w/2 helpers, pt able to reach to bedrail and attempt to pull himself up w/therapists. Pt was left supine in bed, wrist restraints donned, telesitter in place and all needs in reach.    Therapy Documentation Precautions:  Precautions Precautions: Fall, Other (comment) Precaution Comments: supplemental O2 via [place], L hemi, impaired safety awareness, impulsive, NPO Restrictions Weight Bearing Restrictions: No   Therapy/Group: Individual Therapy Jill Alexanders Khali Perella, PT, DPT  10/04/2020, 7:45 AM

## 2020-10-04 NOTE — Progress Notes (Signed)
Modified Barium Swallow Progress Note  Patient Details  Name: Brent [name] MRN: [id number] Date of Birth: 02-05-1966  Today's Date: 10/04/2020  Modified Barium Swallow completed.  Full report located under Chart Review in the Imaging Section.  Brief recommendations include the following:  Clinical Impression  Patient presents with a moderate oral and a mod-severe pharyngeal phase dysphagia which appears similar as compared to most recent past MBS on 7/28. During oral phase of swallow, patient exhibited reduced mastication of solids, delays in anterior to posterior transit of boluses and premature spillage of all boluses into vallecular sinus. Swallow was initiated at level of vallecular sinus for all tested boluses. During pharyngeal phase of swallow, patient exhibited silent aspiration (PAS 8) of mild-moderate amount with puree solids on 2-3 of 7 trials with aspiration occuring during the swallow. Patient exhibited silent aspiration during the swallow (PAS 8) as well as penetration to level of vocal cords after the swallow. (PAS 5). He was able to clear penetrate with cued cough, however all aspiration and penetration events were silent with patient exhibiting no evidence of sensation. With honey thick liquids and nectar thick liquids (via spoon and cup sip), patient exhibited silent aspiration (PAS 8) 3 of 5 trials with honey thick, 2 of 2 trials with nectar thick. SLP is recommending to continue NPO status with plan for trials of pudding thick liquids/smooth thick purees (pudding, etc) with SLP only.   Swallow Evaluation Recommendations       SLP Diet Recommendations: NPO;Ice chips PRN after oral care       Medication Administration: Via alternative means               Oral Care Recommendations: Oral care QID;Staff/trained caregiver to provide oral care      Angela Nevin, MA, CCC-SLP Speech Therapy

## 2020-10-04 NOTE — Progress Notes (Signed)
Occupational Therapy Weekly Progress Note  Patient Details  Name: Brent [name] MRN: [id number] Date of Birth: 15-Jun-1965  Beginning of progress report period: September 28, 2020 End of progress report period: October 04, 2020  Today's Date: 10/04/2020 OT Individual Time: 8182-9937 OT Individual Time Calculation (min): 50 min    Patient has met 2 of 4 short term goals.     Patient continues to demonstrate the following deficits: impaired timing and sequencing, abnormal tone, unbalanced muscle activation, decreased coordination, and decreased motor planning, decreased midline orientation, decreased attention to left, and decreased motor planning, decreased initiation, decreased attention, decreased awareness, decreased problem solving, and decreased safety awareness, and decreased sitting balance, decreased standing balance, decreased postural control, hemiplegia, decreased balance strategies, and difficulty maintaining precautions and therefore will continue to benefit from skilled OT intervention to enhance overall performance with BADL and Reduce care partner burden.  Patient progressing toward long term goals..  Continue plan of care.  OT Short Term Goals Week 1:  OT Short Term Goal 1 (Week 1): Pt will maintain sustained attention to basic selfcare/grooming tasks with no more than mod instructional cueing for re-direction. OT Short Term Goal 1 - Progress (Week 1): Met OT Short Term Goal 2 (Week 1): Pt will maintain static sitting balance EOB with supervision for 4 mins in preparation for selfcare tasks. OT Short Term Goal 2 - Progress (Week 1): Not met OT Short Term Goal 3 (Week 1): Pt will complete UB bathing with min assist for two consecutive sessions. OT Short Term Goal 3 - Progress (Week 1): Not met OT Short Term Goal 4 (Week 1): Pt will complete sit to stand with total +2 (pt 30%) during LB bathing or dressing. OT Short Term Goal 4 - Progress (Week 1): Met Week 2:  OT Short Term  Goal 1 (Week 2): Pt will maintain static sitting balance EOB with supervision for 4 mins in preparation for selfcare tasks. OT Short Term Goal 2 (Week 2): Pt will complete UB bathing with min assist for two consecutive sessions. OT Short Term Goal 3 (Week 2): Pt will demonstrate selective attention in minimally distracting environment x 5 min following overt verbal cue in familiar task. OT Short Term Goal 4 (Week 2): Patient will stand with mod assist in preparation for LB dressing.  Skilled Therapeutic Interventions/Progress Updates:    Patient seen for OT this am.  Patient in bed supine upon arrival - not fully alert, receiving nursing care.  Patient agreeable to getting out of bed.  Patient needed assist to roll toward right to sit edge of bed.  Patient did not activate left trunk in this transition.  Mod assist to roll, max assist to come to sitting.  Patient able to sit edge of flat bed with min assist - had two LOB's to left with no awareness or attempts to correct - more attentional impairment then actual physical impairment. With overt cueing to remain upright - able to sit with close supervision - static sitting.   Sponge bathe with max assist and facilitation for upright sitting in midline - lists to left.   Dressing upper body with mod assist, lower body max/dependent.  Sit to stand with max assist from elevated bed.  Transfer to chair with mod assist to left max assist back to bed for swallow study.    Therapy Documentation Precautions:  Precautions Precautions: Fall, Other (comment) Precaution Comments: supplemental O2 via [place], L hemi, impaired safety awareness, impulsive, NPO Restrictions Weight Bearing Restrictions:  No    Pain: Pain Assessment Pain Scale: 0-10 Pain Score: 0-No pain    Therapy/Group: Individual Therapy  Mariah Milling 10/04/2020, 10:49 AM

## 2020-10-04 NOTE — Progress Notes (Signed)
Speech Language Pathology Weekly Progress and Session Note  Patient Details  Name: Brent [name] MRN: [id number] Date of Birth: Jan 12, 1966  Beginning of progress report period:  09/28/2020 End of progress report period:  10/04/2020  Today's Date: 10/04/2020 SLP Individual Time:  -     Short Term Goals: Week 1: SLP Short Term Goal 1 (Week 1): Pt will consume trial of ice chips with s/s aspiration <50% of trials with mod cues for swallow strategies SLP Short Term Goal 1 - Progress (Week 1): Progressing toward goal SLP Short Term Goal 2 (Week 1): Pt will recall functional information with 75% accuracy mod A cues SLP Short Term Goal 2 - Progress (Week 1): Not met SLP Short Term Goal 3 (Week 1): Pt will utilize compensatory strategies for speech intelligibility with mod A cues SLP Short Term Goal 3 - Progress (Week 1): Partly met SLP Short Term Goal 4 (Week 1): Pt will ID 3-5 functional changes since CVA with mod A cues SLP Short Term Goal 4 - Progress (Week 1): Not met SLP Short Term Goal 5 (Week 1): Pt will complete simple problem solving tasks for daily routine with 75% accuracy mod A cues SLP Short Term Goal 5 - Progress (Week 1): Partly met SLP Short Term Goal 6 (Week 1): Pt will increase sustained attention to 3 minutes provided mod A cues SLP Short Term Goal 6 - Progress (Week 1): Progressing toward goal    New Short Term Goals: Week 2: SLP Short Term Goal 1 (Week 2): Patient will use visual aids to orient to time/place/situation with modA cues. SLP Short Term Goal 2 (Week 2): Patient will demonstrate awareness to errors during basic level functional tasks with modA cues. SLP Short Term Goal 3 (Week 2): Patient will demonstrate sustained attention during functional tasks for increments of 2-3 minutes with modA cues. SLP Short Term Goal 4 (Week 2): Patient will tolerate trials of smooth thick purees/pudding thick liquids with SLP only with minimal overt s/s  aspiration/penetration.  Weekly Progress Updates:  Patient has made minimal progress and did not meet any of his STG's. He continues with significant impairments in all areas of cognition. MBS was completed on 8/19 to reassess swallow function but unfortunately little to no change since previous MBS on 7/28 and NPO continues to be recommended. SLP plans to trial pudding thick liquids and smooth, thick puree textures as patient tolerated these best on MBS (aspiration on 2-3 of 7 trials.    Intensity: Minumum of 1-2 x/day, 30 to 90 minutes Frequency: 3 to 5 out of 7 days Duration/Length of Stay: 9/9 Treatment/Interventions: Cognitive remediation/compensation;Dysphagia/aspiration precaution training;Internal/external aids;Speech/Language facilitation;Therapeutic Activities;Cueing hierarchy;Patient/family education;Functional tasks;Therapeutic Exercise     Sonia Baller, MA, CCC-SLP Speech Therapy

## 2020-10-04 NOTE — Progress Notes (Signed)
IV removed due to IV no longer working, okay to leave IV out per PA-C. Orders received to change Protonix from IV to oral suspension via tube.

## 2020-10-04 NOTE — Progress Notes (Signed)
Brent [name] PHYSICAL MEDICINE & REHABILITATION PROGRESS NOTE  Subjective/Complaints: Denies pain Tolerated therapy today Makes inappropriate comments to staff  ROS: Limited due to cognition.  Objective: Vital Signs: Blood pressure 109/71, pulse 77, temperature 98 F (36.7 C), temperature source Oral, resp. rate 15, height 6' (1.829 m), weight 81.4 kg, SpO2 98 %. No results found. Recent Labs    10/02/20 1431 10/03/20 0514  WBC 11.1* 12.5*  HGB 7.6* 7.8*  HCT 23.2* 23.3*  PLT 268 255   Recent Labs    10/01/20 2021  NA 139  K 3.8  CL 110  CO2 23  GLUCOSE 159*  BUN 16  CREATININE 0.81  CALCIUM 8.8*    Intake/Output Summary (Last 24 hours) at 10/04/2020 1542 Last data filed at 10/04/2020 0826 Gross per 24 hour  Intake 0 ml  Output 800 ml  Net -800 ml         Physical Exam: BP 109/71 (BP Location: Left Arm)   Pulse 77   Temp 98 F (36.7 C) (Oral)   Resp 15   Ht 6' (1.829 m)   Wt 81.4 kg   SpO2 98%   BMI 24.34 kg/m  Gen: no distress, normal appearing HEENT: oral mucosa pink and moist, NCAT Cardio: Reg rate Chest: normal effort, normal rate of breathing GI: Non-distended.  BS +.  + PEG. Skin: Warm and dry.  Intact. Psych: Impulsive.  Hyperactive. Musc: No edema in extremities.  No tenderness in extremities. Neuro: Alert Dysarthria, unchanged LUE: Shoulder abduction, elbow flexion/extension 1/5, handgrip 3/5, improving LLE: 1/5 proximal distal  Assessment/Plan: 1. Functional deficits which require 3+ hours per day of interdisciplinary therapy in a comprehensive inpatient rehab setting. Physiatrist is providing close team supervision and 24 hour management of active medical problems listed below. Physiatrist and rehab team continue to assess barriers to discharge/monitor patient progress toward functional and medical goals   Care Tool:  Bathing    Body parts bathed by patient: Chest, Abdomen, Right upper leg, Face   Body parts bathed by helper:  Right arm, Left arm, Left upper leg, Right lower leg, Left lower leg Body parts n/a: Front perineal area, Buttocks   Bathing assist Assist Level: Maximal Assistance - Patient 24 - 49%     Upper Body Dressing/Undressing Upper body dressing   What is the patient wearing?: Pull over shirt    Upper body assist Assist Level: Maximal Assistance - Patient 25 - 49%    Lower Body Dressing/Undressing Lower body dressing      What is the patient wearing?: Pants     Lower body assist Assist for lower body dressing: Total Assistance - Patient < 25%     Toileting Toileting Toileting Activity did not occur Press photographer and hygiene only): N/A (no void or bm)  Toileting assist Assist for toileting: Dependent - Patient 0%     Transfers Chair/bed transfer  Transfers assist  Chair/bed transfer activity did not occur: Safety/medical concerns  Chair/bed transfer assist level: Maximal Assistance - Patient 25 - 49%     Locomotion Ambulation   Ambulation assist   Ambulation activity did not occur: Safety/medical concerns          Walk 10 feet activity   Assist  Walk 10 feet activity did not occur: Safety/medical concerns        Walk 50 feet activity   Assist Walk 50 feet with 2 turns activity did not occur: Safety/medical concerns         Walk 150  feet activity   Assist Walk 150 feet activity did not occur: Safety/medical concerns         Walk 10 feet on uneven surface  activity   Assist Walk 10 feet on uneven surfaces activity did not occur: Safety/medical concerns         Wheelchair     Assist Will patient use wheelchair at discharge?: Yes Type of Wheelchair: Manual Wheelchair activity did not occur: Safety/medical concerns         Wheelchair 50 feet with 2 turns activity    Assist    Wheelchair 50 feet with 2 turns activity did not occur: Safety/medical concerns       Wheelchair 150 feet activity     Assist   Wheelchair 150 feet activity did not occur: Safety/medical concerns        Medical Problem List and Plan: 1.   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 Hx of prior stroke 6/22.  Continue CIR 2.  Impaired mobility: Discontinue Lovenox given bleeding from PEG site.              -antiplatelet therapy: Continue Aspirin 81 mg daily and Plavix 75 mg dailyx 3 weeks 3. Pain: d/c IV Dilaudid. Decrease Hydrocodone to 1 tablet q8H needed  Appears controlled on 8/19 4. Anxiety: Continue Xanax as needed. Increased seroquel to 25mg  HS 5. Neuropsych: This patient is not capable of making decisions on his 1.40 on 8/12 behalf.  Telemetry sitter for safety, continue  Wrist restraints for safety, continue 6. Skin/Wound Care: Routine skin checks 7. 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 -SLP follow up, advance diet as tolerated 9. Cocaine/tobacco abuse.UDS positive cocaine.  Counseled on appropriate 10.  Diabetes mellitus.  Hemoglobin A1c 6.4.   Diabetic teaching when appropriate  Levemir decreased to 8 on 8/17  Discontinue Novolog- no more hypoglycemic episodes after this 11.  Hyperlipidemia.  Crestor/fenofibrate 12.Aspiration pneumonia.  Completed 14 days antibiotic 13.  Constipation.  MiraLAX 14.  Acute blood loss anemia  Hemoglobin 7.6 on 8/17, up to 7.8 on 8/18, plavix resumed as per GI recommendations. Continue PPI indefinitely. Repeat CBC periodically.   Endoscopy shows no ulcers 15.  Leukocytosis  WBCs 13.5 on 8/17, improving 16.  AKI: Resolved  Creatinine 0.81 on 8/16  IVF initiated, IVF DC'd 17. Hypernatremia: Sodium 139 on 8/16, improved 18. HTN: started propanolol 10mg  daily.  Much  better controlled  LOS: 7 days A FACE TO FACE EVALUATION WAS PERFORMED  9/16 Reise Gladney 10/04/2020, 3:42 PM

## 2020-10-04 NOTE — Progress Notes (Signed)
Physical Therapy Session Note  Patient Details  Name: Brent [name] MRN: [id number] Date of Birth: Nov 01, 1965  Today's Date: 10/04/2020 PT Individual Time: 9892-1194 PT Individual Time Calculation (min): 48 min   Short Term Goals: Week 1:  PT Short Term Goal 1 (Week 1): Pt will tolerate upright, OOB sitting for at least 1 hour between therapy sessions. PT Short Term Goal 2 (Week 1): Pt will perform sit<>stands with +2 total assist PT Short Term Goal 3 (Week 1): Pt will complete bed<>chair transfers with +2 total assist PT Short Term Goal 4 (Week 1): Pt will demonstrate improved sitting balance with ability to maintain static sitting for at least 2 minutes without UE support  Skilled Therapeutic Interventions/Progress Updates:     Pt supine in bed to start session. Pt reports unrated back pain, did not appear in any distress during session - rest breaks and mobility provided for pain management. Supine<>sit with modA for LLE and trunk management. Donned pants while seated EOB - required assist for threading and pulling up in standing with +2 assist. Requires MaxA for sit<>stand with L knee block with poor ability to achieve full upright. Squat<>pivot transfer completed with maxA of 1 person and L knee block with +2 assist on standby. Pt requesting to call his wife - dialed the # for him but line was busy. Pt wheeled to ortho rehab gym in TIS w/c and completed squat<>pivot transfer in similar manner as above to mat table. Requires minA for static sitting due to L lean that is able to be corrected with verbal and tactile cues. Worked on sitting balance with cone reaching task towards his R and downward to [employer]. Pt requires modA for sitting balance while reaching down to the platform due to L lean and very delayed righting responses. Pt perseverative on calling his wife and he was easily redirectable at beginning of session but this became worse as the session progressed. Of note, he also  would make very crude and inappropriate comments regarding male staff and this PT sternly made him aware that this was not appropriate and needs to stop - pt laughed but voiced understanding. Squat<>pivot transfer with maxA to TIS w/c and wheeled to main rehab gym into the // bars. Worked on sit<>stands and static standing balance  in // bars - requires maxA with L knee block to stand and modA for standing balance - had him count to 30 for each stand where he was able to 2 successfully (multiple failed efforts). Pt again being perseverative on going back to his room and calling his wife, becoming more agitated as session progressed. Pt returned to his room and assisted in calling his wife but line remained busy. He missed 12 minutes of skilled therapy due to agitation and refusal.  Therapy Documentation Precautions:  Precautions Precautions: Fall, Other (comment) Precaution Comments: supplemental O2 via [place], L hemi, impaired safety awareness, impulsive, NPO Restrictions Weight Bearing Restrictions: No General: PT Amount of Missed Time (min): 12 Minutes PT Missed Treatment Reason: Increased agitation;Patient unwilling to participate;Patient fatigue  Therapy/Group: Individual Therapy  Orrin Brigham 10/04/2020, 7:43 AM

## 2020-10-05 LAB — GLUCOSE, CAPILLARY
Glucose-Capillary: 122 mg/dL — ABNORMAL HIGH (ref 70–99)
Glucose-Capillary: 186 mg/dL — ABNORMAL HIGH (ref 70–99)
Glucose-Capillary: 201 mg/dL — ABNORMAL HIGH (ref 70–99)
Glucose-Capillary: 67 mg/dL — ABNORMAL LOW (ref 70–99)
Glucose-Capillary: 75 mg/dL (ref 70–99)
Glucose-Capillary: 91 mg/dL (ref 70–99)
Glucose-Capillary: 94 mg/dL (ref 70–99)

## 2020-10-05 NOTE — Progress Notes (Signed)
Speech Language Pathology Daily Session Note  Patient Details  Name: Brent [name] MRN: [id number] Date of Birth: October 22, 1965  Today's Date: 10/05/2020 SLP Individual Time: 0626-9485 SLP Individual Time Calculation (min): 40 min  Short Term Goals: Week 2: SLP Short Term Goal 1 (Week 2): Patient will use visual aids to orient to time/place/situation with modA cues. SLP Short Term Goal 2 (Week 2): Patient will demonstrate awareness to errors during basic level functional tasks with modA cues. SLP Short Term Goal 3 (Week 2): Patient will demonstrate sustained attention during functional tasks for increments of 2-3 minutes with modA cues. SLP Short Term Goal 4 (Week 2): Patient will tolerate trials of smooth thick purees/pudding thick liquids with SLP only with minimal overt s/s aspiration/penetration.  Skilled Therapeutic Interventions: Pt seen for skilled ST with focus on cognitive and dysphagia goals. Pt demonstrating increased insight into swallowing deficits today, stating "I guess the test wasn't so good" in reference to MBS previous day. Re-educated on results and recommendations. Pt HOB raised for trials of pudding, administered via SLP in 1/4 tsp boluses. Pt with apparent mildly delayed swallow initiation during trails, no overt s/s aspiration across any trials. With ice chips, pt continues with significant overt s/s aspiration on ~75% of trials. SLP assisting pt in contacting wife via telephone, providing education and clarification that pudding trials this AM were therapeutic in nature and only to be administered by SLP. Pt continues with diminished sustained attention impacting basic recall of information, averaging ~1-1.5 minutes this date. This could be impacted by medication administered in the night and nursing meds given prior to this ST tx session. Pt becoming sleepy toward end of session, closing eyes. Left in bed with soft wrist restraints intact, bed alarm set and all needs within  reach. Cont ST POC.  Pain Pain Assessment Pain Scale: 0-10 Pain Score: 0-No pain Pain Type: Chronic pain Pain Location: Back Pain Descriptors / Indicators: Aching Pain Frequency: Constant Pain Onset: On-going Pain Intervention(s): Medication (See eMAR)  Therapy/Group: Individual Therapy  Tacey Ruiz 10/05/2020, 8:33 AM

## 2020-10-05 NOTE — Significant Event (Signed)
Hypoglycemic Event  CBG: 67  Treatment: 4 oz juice/soda  Symptoms: Hungry  Follow-up CBG: Time:0420  CBG Result:75  Possible Reasons for Event: Medication regimen:    Comments/MD notified:MD to notify    [employer]

## 2020-10-05 NOTE — Progress Notes (Signed)
While nurse tech was in changing patient patient stated "I don't care if you all die, damn you all to hell, I could give a fuck about you all you cal all go fuckin die."  Reminded patient that is inappropriate to talk to people that way and not to speak like that.

## 2020-10-06 LAB — GLUCOSE, CAPILLARY
Glucose-Capillary: 100 mg/dL — ABNORMAL HIGH (ref 70–99)
Glucose-Capillary: 102 mg/dL — ABNORMAL HIGH (ref 70–99)
Glucose-Capillary: 138 mg/dL — ABNORMAL HIGH (ref 70–99)
Glucose-Capillary: 171 mg/dL — ABNORMAL HIGH (ref 70–99)
Glucose-Capillary: 177 mg/dL — ABNORMAL HIGH (ref 70–99)
Glucose-Capillary: 85 mg/dL (ref 70–99)

## 2020-10-06 MED ORDER — INSULIN DETEMIR 100 UNIT/ML ~~LOC~~ SOLN
5.0000 [IU] | Freq: Two times a day (BID) | SUBCUTANEOUS | Status: DC
  Administered 2020-10-06 – 2020-10-11 (×10): 5 [IU] via SUBCUTANEOUS
  Filled 2020-10-06 (×11): qty 0.05

## 2020-10-06 NOTE — Progress Notes (Signed)
Patient complained of back pain.Pain medicine given and pt fell asleep  When he woke up, pt verbalized repeatedly that  he wanted to die, to let him die in peace. He stated he will commit suicide and asked RN if doctor will help him do it. He threatened RN that he will pull out his "red tube" referring to PEG. Patient reoriented and emotional support given. Xanax administered.

## 2020-10-06 NOTE — Progress Notes (Signed)
Mitten applied on R hand. Explained to patient the need to protect the PEG from getting pulled. Later, patient stated he does not want to die.

## 2020-10-06 NOTE — Progress Notes (Signed)
Speech Language Pathology Daily Session Note  Patient Details  Name: YOUSSEF FOOTMAN MRN: [id number] Date of Birth: 18-Jul-1965  Today's Date: 10/06/2020 SLP Individual Time: 1101-1145 SLP Individual Time Calculation (min): 44 min  Short Term Goals: Week 2: SLP Short Term Goal 1 (Week 2): Patient will use visual aids to orient to time/place/situation with modA cues. SLP Short Term Goal 2 (Week 2): Patient will demonstrate awareness to errors during basic level functional tasks with modA cues. SLP Short Term Goal 3 (Week 2): Patient will demonstrate sustained attention during functional tasks for increments of 2-3 minutes with modA cues. SLP Short Term Goal 4 (Week 2): Patient will tolerate trials of smooth thick purees/pudding thick liquids with SLP only with minimal overt s/s aspiration/penetration.  Skilled Therapeutic Interventions: Pt seen for skilled ST with focus on cognitive and dysphagia goals. Pt very tearful and emotional labile throughout session. SLP assisting patient in repositioning, calling family members and putting church on TV to attempt to decrease agitation. SLP facilitating orientation task to increase recall of recent events and temporal concepts by providing mod A cues. Pt repositioned in bed for ongoing swallow assessment with PO trials of pudding. Pt consuming 2 oz pudding via 1/4 tsp with 2 episodes large, wet cough after swallow toward end of trials, likely d/t to build up of pharyngeal residue. Pt benefits from consistent verbal cues to utilize effortful swallow and throat clear re-swallow. Pt left in bed with alarm set, soft wrist restraints and mitt present, all needs met. Cont ST POC.     Pain Pain Assessment Pain Scale: 0-10 Pain Score: 0-No pain  Therapy/Group: Individual Therapy  Dewaine Conger 10/06/2020, 11:38 AM

## 2020-10-06 NOTE — Progress Notes (Signed)
[facility] PHYSICAL MEDICINE & REHABILITATION PROGRESS NOTE  Subjective/Complaints:  Remains confused, at risk for dislodging G tube   ROS: Limited due to cognition.  Objective: Vital Signs: Blood pressure (!) 145/80, pulse 70, temperature 98.3 F (36.8 C), resp. rate 18, height 6' (1.829 m), weight 81.4 kg, SpO2 96 %. DG Swallowing Func-Speech Pathology  Result Date: 10/04/2020 Table formatting from the original result was not included. Objective Swallowing Evaluation: Type of Study: MBS-Modified Barium Swallow Study  Patient Details Name: Brent [name] MRN: [id number] Date of Birth: 1965/05/16 Today's Date: 10/04/2020 Time: SLP Start Time (ACUTE ONLY): 1538 -SLP Stop Time (ACUTE ONLY): 1608 SLP Time Calculation (min) (ACUTE ONLY): 30 min Past Medical History: Past Medical History: Diagnosis Date  Aplastic anemia, unspecified (HCC)   Chest pain, unspecified   Chronic airway obstruction, not elsewhere classified   Cocaine substance abuse (HCC)   Neuropathy   Other and unspecified hyperlipidemia   Shortness of breath   Stroke (HCC) 06/17/2020  Tobacco use disorder   Type II or unspecified type diabetes mellitus without mention of complication, not stated as uncontrolled   Unspecified epilepsy without mention of intractable epilepsy   Unspecified essential hypertension  Past Surgical History: Past Surgical History: Procedure Laterality Date  BIOPSY  10/02/2020  Procedure: BIOPSY;  Surgeon: Jenel Lucks, MD;  Location: [facility] ENDOSCOPY;  Service: Gastroenterology;;  CARDIAC CATHETERIZATION  10/02/2010   Nonobstructive mild coronary plaque  ESOPHAGOGASTRODUODENOSCOPY N/A 10/02/2020  Procedure: ESOPHAGOGASTRODUODENOSCOPY (EGD);  Surgeon: Jenel Lucks, MD;  Location: [facility] ENDOSCOPY;  Service: Gastroenterology;  Laterality: N/A;  IR GASTROSTOMY TUBE MOD SED  09/24/2020  KNEE SURGERY Bilateral 2018  Repair  PERFORATED VISCUS SURGERY    MULTIPLE FRACTURES HPI: Pt is a 55 y/o male who presented to the ED  with SOB and was noted to have L hemiparesis and significant dysarthria. Patient admitted to [facility] with acute respiratory failure with hypoxia secondary to PNA, COPD exacerbation and sepsis. Imaging revealed R ACA CVA x2 within R cingulate gyrus/callosal body, R parietal lobe subcortical white matter infarct and questionable acute punctate infarct within L frontal lobe subcortical white matter. Transferred to ICU at [facility] 7/31 due to ongoing respiratory failure with complete opacification of left hemithorax. PMH: cocaine and tobacco use disorder, lumbago, HTN, HLD, DMII and Hx of CVA. Pt with known dysphagia (since first MBS 11/2018) and noncompliance with recommendations. MBS completed in June with recommendation for regular textures and honey thick liquids. MBS 7/28: oropharyngeal dysphagia with consistent small amounts of aspiration which are not cleared by pt following weak delayed cough. despite. Weak bolus manipulation, reduced bolus cohesion, a pharyngeal delay, and reduced laryngeal vestibule closure. An NPO status with alternate means of nutrition was recommended at that time. Patient had PEG placement 8/9.  Subjective: "I know what I need to do." (stop drugs) Assessment / Plan / Recommendation CHL IP CLINICAL IMPRESSIONS 10/04/2020 Clinical Impression Patient presents with a moderate oral and a mod-severe pharyngeal phase dysphagia which appears similar as compared to most recent past MBS on 7/28. During oral phase of swallow, patient exhibited reduced mastication of solids, delays in anterior to posterior transit of boluses and premature spillage of all boluses into vallecular sinus. Swallow was initiated at level of vallecular sinus for all tested boluses. During pharyngeal phase of swallow, patient exhibited silent aspiration (PAS 8) of mild-moderate amount with puree solids on 2-3 of 7 trials with aspiration occuring during the swallow. Patient exhibited silent aspiration during the swallow (PAS 8) as well  as penetration to level of vocal cords after the swallow. (PAS 5). He was able to clear penetrate with cued cough, however all aspiration and penetration events were silent with patient exhibiting no evidence of sensation. With honey thick liquids and nectar thick liquids (via spoon and cup sip), patient exhibited silent aspiration (PAS 8) 3 of 5 trials with honey thick, 2 of 2 trials with nectar thick. SLP is recommending to continue NPO status with plan for trials of pudding thick liquids/smooth thick purees (pudding, etc) with SLP only. SLP Visit Diagnosis -- Attention and concentration deficit following -- Frontal lobe and executive function deficit following -- Impact on safety and function --   CHL IP TREATMENT RECOMMENDATION 09/12/2020 Treatment Recommendations Therapy as outlined in treatment plan below   Prognosis 10/04/2020 Prognosis for Safe Diet Advancement Guarded Barriers to Reach Goals Severity of deficits;Time post onset Barriers/Prognosis Comment -- CHL IP DIET RECOMMENDATION 10/04/2020 SLP Diet Recommendations NPO;Ice chips PRN after oral care Liquid Administration via -- Medication Administration Via alternative means Compensations -- Postural Changes --   CHL IP OTHER RECOMMENDATIONS 10/04/2020 Recommended Consults -- Oral Care Recommendations Oral care QID;Staff/trained caregiver to provide oral care Other Recommendations --   CHL IP FOLLOW UP RECOMMENDATIONS 09/25/2020 Follow up Recommendations Skilled Nursing facility   [facility] IP FREQUENCY AND DURATION 09/12/2020 Speech Therapy Frequency (ACUTE ONLY) min 3x week Treatment Duration 1 week      CHL IP ORAL PHASE 10/04/2020 Oral Phase Impaired Oral - Pudding Teaspoon -- Oral - Pudding Cup -- Oral - Honey Teaspoon Premature spillage Oral - Honey Cup Premature spillage Oral - Nectar Teaspoon Premature spillage Oral - Nectar Cup Premature spillage;Delayed oral transit;Reduced posterior propulsion;Weak lingual manipulation Oral - Nectar Straw -- Oral - Thin  Teaspoon -- Oral - Thin Cup -- Oral - Thin Straw -- Oral - Puree Premature spillage;Weak lingual manipulation;Reduced posterior propulsion;Delayed oral transit Oral - Mech Soft -- Oral - Regular Premature spillage;Weak lingual manipulation;Impaired mastication;Reduced posterior propulsion;Piecemeal swallowing Oral - Multi-Consistency -- Oral - Pill -- Oral Phase - Comment --  CHL IP PHARYNGEAL PHASE 10/04/2020 Pharyngeal Phase Impaired Pharyngeal- Pudding Teaspoon -- Pharyngeal -- Pharyngeal- Pudding Cup -- Pharyngeal -- Pharyngeal- Honey Teaspoon Delayed swallow initiation-vallecula;Reduced airway/laryngeal closure;Penetration/Aspiration during swallow;Moderate aspiration;Pharyngeal residue - pyriform Pharyngeal Material enters airway, passes BELOW cords without attempt by patient to eject out (silent aspiration) Pharyngeal- Honey Cup Delayed swallow initiation-vallecula;Reduced airway/laryngeal closure;Trace aspiration;Moderate aspiration;Penetration/Apiration after swallow Pharyngeal Material enters airway, passes BELOW cords without attempt by patient to eject out (silent aspiration) Pharyngeal- Nectar Teaspoon Delayed swallow initiation-vallecula;Reduced airway/laryngeal closure;Penetration/Aspiration during swallow Pharyngeal Material enters airway, passes BELOW cords without attempt by patient to eject out (silent aspiration) Pharyngeal- Nectar Cup Penetration/Aspiration during swallow;Reduced airway/laryngeal closure;Delayed swallow initiation-vallecula Pharyngeal Material enters airway, passes BELOW cords without attempt by patient to eject out (silent aspiration) Pharyngeal- Nectar Straw -- Pharyngeal -- Pharyngeal- Thin Teaspoon -- Pharyngeal -- Pharyngeal- Thin Cup -- Pharyngeal -- Pharyngeal- Thin Straw -- Pharyngeal -- Pharyngeal- Puree Delayed swallow initiation-vallecula;Reduced airway/laryngeal closure;Penetration/Aspiration during swallow;Penetration/Apiration after swallow Pharyngeal Material  enters airway, passes BELOW cords without attempt by patient to eject out (silent aspiration);Material enters airway, CONTACTS cords and not ejected out Pharyngeal- Mechanical Soft -- Pharyngeal -- Pharyngeal- Regular -- Pharyngeal -- Pharyngeal- Multi-consistency -- Pharyngeal -- Pharyngeal- Pill -- Pharyngeal -- Pharyngeal Comment Patient able to clear penetrate with cued cough  CHL IP CERVICAL ESOPHAGEAL PHASE 10/04/2020 Cervical Esophageal Phase WFL Pudding Teaspoon -- Pudding Cup -- Honey Teaspoon -- Honey Cup -- Nectar Teaspoon --  Nectar Cup -- Nectar Straw -- Thin Teaspoon -- Thin Cup -- Thin Straw -- Puree -- Mechanical Soft -- Regular -- Multi-consistency -- Pill -- Cervical Esophageal Comment -- Angela Nevin, MA, CCC-SLP Speech Therapy             No results for input(s): WBC, HGB, HCT, PLT in the last 72 hours.  No results for input(s): NA, K, CL, CO2, GLUCOSE, BUN, CREATININE, CALCIUM in the last 72 hours.   Intake/Output Summary (Last 24 hours) at 10/06/2020 0928 Last data filed at 10/06/2020 2130 Gross per 24 hour  Intake 1775 ml  Output 250 ml  Net 1525 ml          Physical Exam: BP (!) 145/80 (BP Location: Left Arm)   Pulse 70   Temp 98.3 F (36.8 C)   Resp 18   Ht 6' (1.829 m)   Wt 81.4 kg   SpO2 96%   BMI 24.34 kg/m   General: No acute distress Mood and affect are appropriate Heart: Regular rate and rhythm no rubs murmurs or extra sounds Lungs: Clear to auscultation, breathing unlabored, no rales or wheezes Abdomen: Positive bowel sounds, soft nontender to palpation, nondistended G tube CDI  Extremities: No clubbing, cyanosis, or edema Skin: No evidence of breakdown, no evidence of rash   Musc: No edema in extremities.  No tenderness in extremities. Neuro: Alert Dysarthria, unchanged LUE: Shoulder abduction, elbow flexion/extension 3/5, handgrip 3/5, improving LLE: 3-/5 proximal distal  Assessment/Plan: 1. Functional deficits which require 3+ hours per  day of interdisciplinary therapy in a comprehensive inpatient rehab setting. Physiatrist is providing close team supervision and 24 hour management of active medical problems listed below. Physiatrist and rehab team continue to assess barriers to discharge/monitor patient progress toward functional and medical goals   Care Tool:  Bathing    Body parts bathed by patient: Chest, Abdomen, Right upper leg, Face   Body parts bathed by helper: Right arm, Left arm, Left upper leg, Right lower leg, Left lower leg Body parts n/a: Front perineal area, Buttocks   Bathing assist Assist Level: Maximal Assistance - Patient 24 - 49%     Upper Body Dressing/Undressing Upper body dressing   What is the patient wearing?: Pull over shirt    Upper body assist Assist Level: Maximal Assistance - Patient 25 - 49%    Lower Body Dressing/Undressing Lower body dressing      What is the patient wearing?: Pants     Lower body assist Assist for lower body dressing: Total Assistance - Patient < 25%     Toileting Toileting Toileting Activity did not occur Press photographer and hygiene only): N/A (no void or bm)  Toileting assist Assist for toileting: Dependent - Patient 0%     Transfers Chair/bed transfer  Transfers assist  Chair/bed transfer activity did not occur: Safety/medical concerns  Chair/bed transfer assist level: Maximal Assistance - Patient 25 - 49%     Locomotion Ambulation   Ambulation assist   Ambulation activity did not occur: Safety/medical concerns          Walk 10 feet activity   Assist  Walk 10 feet activity did not occur: Safety/medical concerns        Walk 50 feet activity   Assist Walk 50 feet with 2 turns activity did not occur: Safety/medical concerns         Walk 150 feet activity   Assist Walk 150 feet activity did not occur: Safety/medical concerns  Walk 10 feet on uneven surface  activity   Assist Walk 10 feet on uneven  surfaces activity did not occur: Safety/medical concerns         Wheelchair     Assist Will patient use wheelchair at discharge?: Yes Type of Wheelchair: Manual Wheelchair activity did not occur: Safety/medical concerns         Wheelchair 50 feet with 2 turns activity    Assist    Wheelchair 50 feet with 2 turns activity did not occur: Safety/medical concerns       Wheelchair 150 feet activity     Assist  Wheelchair 150 feet activity did not occur: Safety/medical concerns        Medical Problem List and Plan: 1.   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 Hx of prior stroke 6/22.  Continue CIR 2.  Impaired mobility: Discontinue Lovenox given bleeding from PEG site.              -antiplatelet therapy: Continue Aspirin 81 mg daily and Plavix 75 mg dailyx 3 weeks 3. Pain: d/c IV Dilaudid. Decrease Hydrocodone to 1 tablet q8H needed  Appears controlled on 8/19 4. Anxiety: Continue Xanax as needed. Increased seroquel to 25mg  HS 5. Neuropsych: This patient is not capable of making decisions on his 1.40 on 8/12 behalf.  Telemetry sitter for safety, continue  Wrist restraints for safety, continue 6. Skin/Wound Care: Routine skin checks 7. 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- cont routine PEG care  -SLP follow up, advance diet as tolerated 9. Cocaine/tobacco abuse.UDS positive cocaine.  Counseled on appropriate 10.  Diabetes mellitus.  Hemoglobin A1c 6.4.   Diabetic teaching when appropriate  Levemir decreased to 8 on 8/17  Discontinue Novolog- no more hypoglycemic episodes after this CBG (last 3)  Recent Labs    10/05/20 2337 10/06/20 0354  10/06/20 0801  GLUCAP 186* 85 102*  Reduce levimir to 5U given am CBG of 85  11.  Hyperlipidemia.  Crestor/fenofibrate 12.Aspiration pneumonia.  Completed 14 days antibiotic 13.  Constipation.  MiraLAX 14.  Acute blood loss anemia  Hemoglobin 7.6 on 8/17, up to 7.8 on 8/18, plavix resumed as per GI recommendations. Continue PPI indefinitely. Repeat CBC periodically.   Endoscopy shows no ulcers 15.  Leukocytosis  WBCs 13.5 on 8/17, improving- repeat CBC in am remains afebrile  16.  AKI: Resolved  Creatinine 0.81 on 8/16  IVF initiated, IVF DC'd 17. Hypernatremia: Sodium 139 on 8/16, improved 18. HTN: started propanolol 10mg  daily.   Vitals:   10/05/20 2117 10/06/20 0318  BP:  (!) 145/80  Pulse:  70  Resp:  18  Temp:  98.3 F (36.8 C)  SpO2: 96% 96%   Fair control 8/21  LOS: 9 days A FACE TO FACE EVALUATION WAS PERFORMED  Erick Colacendrew E Reichen Hutzler 10/06/2020, 9:28 AM

## 2020-10-06 NOTE — Progress Notes (Signed)
Patient alert and forgettable. Remains on bilateral wrist restraints due to attempting to pull PEG tube out. PEG tube secured with abdominal binder. Bilateral wrist restraints released every hrs for ROM. Patient appears anxious and was medicated as ordered. Multiple call light call by patient even with staff in room. Patient keeps babbling about not want ing to hurt his family. He made multiple call to his family today. Tolerating PEG feeding of Osmolite  as ordered with no difficulty. HOB elevate degrees during each feeding. No signs of aspiration noted. Bowel sounds remains active al quads. No emesis noted. Family visited.

## 2020-10-07 LAB — CBC WITH DIFFERENTIAL/PLATELET
Abs Immature Granulocytes: 0.04 10*3/uL (ref 0.00–0.07)
Basophils Absolute: 0.1 10*3/uL (ref 0.0–0.1)
Basophils Relative: 1 %
Eosinophils Absolute: 0.5 10*3/uL (ref 0.0–0.5)
Eosinophils Relative: 5 %
HCT: 27.3 % — ABNORMAL LOW (ref 39.0–52.0)
Hemoglobin: 9 g/dL — ABNORMAL LOW (ref 13.0–17.0)
Immature Granulocytes: 0 %
Lymphocytes Relative: 17 %
Lymphs Abs: 1.6 10*3/uL (ref 0.7–4.0)
MCH: 31.9 pg (ref 26.0–34.0)
MCHC: 33 g/dL (ref 30.0–36.0)
MCV: 96.8 fL (ref 80.0–100.0)
Monocytes Absolute: 0.6 10*3/uL (ref 0.1–1.0)
Monocytes Relative: 7 %
Neutro Abs: 6.4 10*3/uL (ref 1.7–7.7)
Neutrophils Relative %: 70 %
Platelets: 311 10*3/uL (ref 150–400)
RBC: 2.82 MIL/uL — ABNORMAL LOW (ref 4.22–5.81)
RDW: 15.6 % — ABNORMAL HIGH (ref 11.5–15.5)
WBC: 9.2 10*3/uL (ref 4.0–10.5)
nRBC: 0 % (ref 0.0–0.2)

## 2020-10-07 LAB — GLUCOSE, CAPILLARY
Glucose-Capillary: 103 mg/dL — ABNORMAL HIGH (ref 70–99)
Glucose-Capillary: 104 mg/dL — ABNORMAL HIGH (ref 70–99)
Glucose-Capillary: 110 mg/dL — ABNORMAL HIGH (ref 70–99)
Glucose-Capillary: 152 mg/dL — ABNORMAL HIGH (ref 70–99)
Glucose-Capillary: 189 mg/dL — ABNORMAL HIGH (ref 70–99)
Glucose-Capillary: 213 mg/dL — ABNORMAL HIGH (ref 70–99)

## 2020-10-07 MED ORDER — HYDROCODONE-ACETAMINOPHEN 5-325 MG PO TABS
1.0000 | ORAL_TABLET | Freq: Two times a day (BID) | ORAL | Status: DC | PRN
  Administered 2020-10-08: 1
  Filled 2020-10-07 (×2): qty 1

## 2020-10-07 NOTE — Progress Notes (Signed)
Chaplain responded to Sp Consult for prayer as requested by pt.  Chaplain found pt and wife at bedside.  Pt offered ministry of presence as pt and wife had a lively conversation back and forth about how important it was for him to participate in therapy and how he has no desire to live any more.  Pt admitted to wife he say that just to get attention.  Wife chastised him for saying things about suicide and not wanting to live b/c hospital has put him on suicide watch. And that will only serve to slow his release.  Pt was agitated, at times crying.  Pt's wife requested prayer.  Chaplain prayed at bedside.  Vernell Morgans Chaplain

## 2020-10-07 NOTE — Progress Notes (Signed)
Occupational Therapy Session Note  Patient Details  Name: Brent [name] MRN: [id number] Date of Birth: Nov 21, 1965  Today's Date: 10/07/2020 OT Individual Time: 5374-8270 OT Individual Time Calculation (min): 60 min    Short Term Goals: Week 2:  OT Short Term Goal 1 (Week 2): Pt will maintain static sitting balance EOB with supervision for 4 mins in preparation for selfcare tasks. OT Short Term Goal 2 (Week 2): Pt will complete UB bathing with min assist for two consecutive sessions. OT Short Term Goal 3 (Week 2): Pt will demonstrate selective attention in minimally distracting environment x 5 min following overt verbal cue in familiar task. OT Short Term Goal 4 (Week 2): Patient will stand with mod assist in preparation for LB dressing.  Skilled Therapeutic Interventions/Progress Updates:    Treatment session with focus on sitting balance, sit > stand, and transfers during self-care tasks and therapeutic activities.  Pt received supine in bed tearful and stating "I've had better days".  Pt attempted bed mobility impulsively, requiring max multimodal cues for sequencing and motor planning bed mobility.  Therapist assisted pt with donning pants from EOB requiring +2 assist as 2nd person providing mod assist to maintain sitting balance while therapist thread pants and donned socks/shoes.  Completed sit > stand total assist from elevated bed and 2nd person pulled pants over hips.  Completed squat pivot transfers max +2 both R and L this session due to decreased weight shifting despite manual facilitation for improved weight shifting and body positioning.  Engaged in corn hole/bean bag toss in sitting with focus on sitting balance.  Pt requiring CGA for static sitting and min-mod assist for sitting balance.  Therapist increased challenge with raising matt table to decrease assist from BLE for sitting balance.  Attempted sit > stand x4 with pt requiring max-total +2 and use of mirror for visual  feedback from improved upright posture.  Pt returned to bed max +2 squat pivot and left semi-reclined in bed with wrist restraints on and nurse tech present in room.  Therapy Documentation Precautions:  Precautions Precautions: Fall, Other (comment) Precaution Comments: supplemental O2 via [place], L hemi, impaired safety awareness, impulsive, NPO Restrictions Weight Bearing Restrictions: No  Pain: Pain Assessment Pain Scale: 0-10 Pain Score: 9  Pain Type: Acute pain Pain Location: Back Pain Orientation: Lower Pain Descriptors / Indicators: Aching Pain Frequency: Intermittent Pain Onset: On-going Pain Intervention(s): Medication (See eMAR)   Therapy/Group: Individual Therapy  Rosalio Loud 10/07/2020, 3:11 PM

## 2020-10-07 NOTE — Progress Notes (Signed)
Physical Therapy Weekly Progress Note  Patient Details  Name: Brent [name] MRN: [id number] Date of Birth: 10/27/1965  Beginning of progress report period: September 28, 2020 End of progress report period: October 07, 2020  Today's Date: 10/07/2020 PT Individual Time: 1101-1201 PT Individual Time Calculation (min): 60 min   Patient has met 2 of 4 short term goals.  Pt progressing towards STGs. Pt is performing bed <>chair transfers w/2 helpers as well as sit <>stands in // bars. Pt progressing towards sitting balance without UE support and requires increased time for initiation. Pt is able to push through LLE and LUE during transfers, but is verbal about expecting therapists to move him rather than initiate movement himself. Pt's significant other very supportive and a good motivator for pt.   Patient continues to demonstrate the following deficits muscle weakness and hemiplegia, impaired timing and sequencing, unbalanced muscle activation, motor apraxia, decreased coordination, and decreased motor planning, decreased initiation, decreased attention, decreased awareness, decreased problem solving, decreased safety awareness, decreased memory, and delayed processing, and decreased sitting balance, decreased standing balance, decreased postural control, hemiplegia, decreased balance strategies, and difficulty maintaining precautions and therefore will continue to benefit from skilled PT intervention to increase functional independence with mobility.  Patient progressing toward long term goals..  Continue plan of care.  PT Short Term Goals Week 1:  PT Short Term Goal 1 (Week 1): Pt will tolerate upright, OOB sitting for at least 1 hour between therapy sessions. PT Short Term Goal 1 - Progress (Week 1): Progressing toward goal PT Short Term Goal 2 (Week 1): Pt will perform sit<>stands with +2 total assist PT Short Term Goal 2 - Progress (Week 1): Met PT Short Term Goal 3 (Week 1): Pt will complete  bed<>chair transfers with +2 total assist PT Short Term Goal 3 - Progress (Week 1): Met PT Short Term Goal 4 (Week 1): Pt will demonstrate improved sitting balance with ability to maintain static sitting for at least 2 minutes without UE support PT Short Term Goal 4 - Progress (Week 1): Progressing toward goal Week 2:  PT Short Term Goal 1 (Week 2): Pt will tolerate upright, OOB sitting for at least 1 hour between therapy sessions. PT Short Term Goal 2 (Week 2): Pt will demonstrate improved sitting balance with ability to maintain static sitting for at least 2 minutes without UE support PT Short Term Goal 3 (Week 2): Pt will perform chair <>bed transfers w/max A x1  Skilled Therapeutic Interventions/Progress Updates:  Pt received supine in bed, denied pain and perseverated on finding a phone to call his SO, Brent [name]. Informed pt he could call Brent [name] at end of session if he participated, pt agreed. Performed bed mobility to remove soiled brief from pt requiring min A x2 and heavy verbal cues for sequencing, initiation and sustaining attention. Donned pt's pants in supine w/max A and performed supine <>sit EOB w/max A x 2 for LE management and trunk support. At EOB, pt donned R shoe w/min A and no UE support, requiring mod A for trunk support to correct R posterior and anterior lean. Donned pt's L shoe w/max A. Pt sat EOB for 5 minutes w/min-mod A for steadying assist without UE support to don shirt w/max A. Squat pivot from EOB to TIS w/max A x2, pt able to initiate pushing off bed w/RUE and push through Brent [name]. Noted pt's O2 was disconnected from wall and wall unit was not on, informed nursing. SpO2 at 97% and maintained at 97%  throughout session.   Pt transported to main gym w/total A. Sit <> stands w/total A x2 in // bars, heavy verbal cues for sequencing and extra time allotted for initiation. Placed mirror ahead of pt for visual biofeedback, but pt perseverating on calling Brent [name] and unable to sustain  attention despite cues. Noted pt pushing through LUE and LLE, but pt demonstrated difficulty maintaining extension of RUE in standing. Pt fatigued very quickly and was transported back to room w/total A. Pt dialed Brent [name]'s himself from memory and spoke to her for a few minutes, which lifted his mood. Squat pivot from TIS to bed w/total A x2 and max A for sit <>lying for LE management and trunk support. Pt was left supine in bed, restraints on, telesitter in place, no O2 on with all needs in reach.  Therapy Documentation Precautions:  Precautions Precautions: Fall, Other (comment) Precaution Comments: supplemental O2 via [place], L hemi, impaired safety awareness, impulsive, NPO Restrictions Weight Bearing Restrictions: No  Therapy/Group: Individual Therapy  Brieonna Crutcher E Macon Sandiford 10/07/2020, 7:48 AM

## 2020-10-07 NOTE — Progress Notes (Signed)
Speech Language Pathology Daily Session Note  Patient Details  Name: Brent [name] MRN: [id number] Date of Birth: 1965/08/27  Today's Date: 10/07/2020 SLP Individual Time: 0901-1000 SLP Individual Time Calculation (min): 59 min  Short Term Goals: Week 2: SLP Short Term Goal 1 (Week 2): Patient will use visual aids to orient to time/place/situation with modA cues. SLP Short Term Goal 2 (Week 2): Patient will demonstrate awareness to errors during basic level functional tasks with modA cues. SLP Short Term Goal 3 (Week 2): Patient will demonstrate sustained attention during functional tasks for increments of 2-3 minutes with modA cues. SLP Short Term Goal 4 (Week 2): Patient will tolerate trials of smooth thick purees/pudding thick liquids with SLP only with minimal overt s/s aspiration/penetration.  Skilled Therapeutic Interventions:Skilled ST services focused on education, swallow and cognitive skills. Pt's long time partner was present karen for treatment session. Pt was orientated to all but year and date. SLP provided education pertaining to swallow function with images for recent MBS noting mild-moderate silent aspiration as well as the likelihood for not advancing to a PO diet during CIR length of stay. Clydie Braun expressed " I cant take him home unless he can eat." SLP provided education pertaining to initiating pharyngeal exercises, puree textures trials with ST services only and continue minimal amount of ice chips provided my nursing for comfort. SLP will reassess need for repeat MBS during CIR length of stay based on vital sign WFL and improvements at bedside. SLP facilitated sustained attention, basic problem solving and recall in card sorting task by color. Pt was able to sort cards by a filed of 4 with min A verbal cues for problem solving/error awareness and required max A verbal cues when advanced to field of 6. Pt required mod A fade to min A verbal cues for recall of method of sorting  color with use of visual aid and demonstrated sustained attention in 1 increasing to 2 minute intervals, requiring max A verbal cues for 5 minute intervals. SLP provided oral care via suction toothbrush prior to trials of pureed textures. Pt demonstrated x2 overt s/s aspiration out of 5 trials (x1 immediate cough and x1 wet vocal quality.) Pt was left in room with call bell within reach and bed alarm set. SLP recommends to continue skilled services.     Pain Pain Assessment Pain Scale: 0-10 Pain Score: 0-No pain Pain Type: Acute pain Pain Location: Back Pain Orientation: Lower Pain Descriptors / Indicators: Aching Pain Frequency: Intermittent Pain Onset: On-going Pain Intervention(s): Medication (See eMAR)  Therapy/Group: Individual Therapy  Sofia Vanmeter  [facility] 10/07/2020, 12:57 PM

## 2020-10-07 NOTE — Progress Notes (Signed)
[place] PHYSICAL MEDICINE & REHABILITATION PROGRESS NOTE  Subjective/Complaints:  Asks for his supper Asks who his therapist is today Expresses no complaints Appreciate chaplain visit today  ROS: Limited due to cognition.  Objective: Vital Signs: Blood pressure 126/77, pulse 84, temperature 98.3 F (36.8 C), temperature source Oral, resp. rate 16, height 6' (1.829 m), weight 81.4 kg, SpO2 94 %. No results found. Recent Labs    10/07/20 0545  WBC 9.2  HGB 9.0*  HCT 27.3*  PLT 311   No results for input(s): NA, K, CL, CO2, GLUCOSE, BUN, CREATININE, CALCIUM in the last 72 hours.   Intake/Output Summary (Last 24 hours) at 10/07/2020 1259 Last data filed at 10/06/2020 2333 Gross per 24 hour  Intake --  Output 100 ml  Net -100 ml         Physical Exam: BP 126/77 (BP Location: Right Arm)   Pulse 84   Temp 98.3 F (36.8 C) (Oral)   Resp 16   Ht 6' (1.829 m)   Wt 81.4 kg   SpO2 94%   BMI 24.34 kg/m  Gen: no distress, normal appearing HEENT: oral mucosa pink and moist, NCAT Cardio: Reg rate Chest: normal effort, normal rate of breathing Abd: soft, non-distended Ext: no edema Psych: pleasant, normal affect Skin: intact  Musc: No edema in extremities.  No tenderness in extremities. Neuro: Alert Dysarthria, unchanged LUE: Shoulder abduction, elbow flexion/extension 3/5, handgrip 3/5, improving LLE: 3-/5 proximal distal  Assessment/Plan: 1. Functional deficits which require 3+ hours per day of interdisciplinary therapy in a comprehensive inpatient rehab setting. Physiatrist is providing close team supervision and 24 hour management of active medical problems listed below. Physiatrist and rehab team continue to assess barriers to discharge/monitor patient progress toward functional and medical goals   Care Tool:  Bathing    Body parts bathed by patient: Chest, Abdomen, Right upper leg, Face   Body parts bathed by helper: Right arm, Left arm, Left upper  leg, Right lower leg, Left lower leg Body parts n/a: Front perineal area, Buttocks   Bathing assist Assist Level: Maximal Assistance - Patient 24 - 49%     Upper Body Dressing/Undressing Upper body dressing   What is the patient wearing?: Pull over shirt    Upper body assist Assist Level: Maximal Assistance - Patient 25 - 49%    Lower Body Dressing/Undressing Lower body dressing      What is the patient wearing?: Pants     Lower body assist Assist for lower body dressing: Total Assistance - Patient < 25%     Toileting Toileting Toileting Activity did not occur Press photographer and hygiene only): N/A (no void or bm)  Toileting assist Assist for toileting: Dependent - Patient 0%     Transfers Chair/bed transfer  Transfers assist  Chair/bed transfer activity did not occur: Safety/medical concerns  Chair/bed transfer assist level: Maximal Assistance - Patient 25 - 49%     Locomotion Ambulation   Ambulation assist   Ambulation activity did not occur: Safety/medical concerns          Walk 10 feet activity   Assist  Walk 10 feet activity did not occur: Safety/medical concerns        Walk 50 feet activity   Assist Walk 50 feet with 2 turns activity did not occur: Safety/medical concerns         Walk 150 feet activity   Assist Walk 150 feet activity did not occur: Safety/medical concerns  Walk 10 feet on uneven surface  activity   Assist Walk 10 feet on uneven surfaces activity did not occur: Safety/medical concerns         Wheelchair     Assist Is the patient using a wheelchair?: Yes Type of Wheelchair: Manual Wheelchair activity did not occur: Safety/medical concerns         Wheelchair 50 feet with 2 turns activity    Assist    Wheelchair 50 feet with 2 turns activity did not occur: Safety/medical concerns       Wheelchair 150 feet activity     Assist  Wheelchair 150 feet activity did not occur:  Safety/medical concerns        Medical Problem List and Plan: 1.   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 Hx of prior stroke 6/22.  Continue CIR 2.  Impaired mobility: Discontinue Lovenox given bleeding from PEG site.              -antiplatelet therapy: Continue Aspirin 81 mg daily and Plavix 75 mg dailyx 3 weeks 3. Pain: d/c IV Dilaudid. Decrease Hydrocodone to 1 tablet q12H needed  Appears controlled on 8/22 4. Anxiety: Continue Xanax as needed. Increased seroquel to 25mg  HS 5. Neuropsych: This patient is not capable of making decisions on his 1.40 on 8/12 behalf.  Telemetry sitter for safety, continue  Wrist restraints for safety, continue 6. Skin/Wound Care: Routine skin checks 7. 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 Cocaine/tobacco abuse.UDS positive cocaine.  Counseled on appropriate 10.  Diabetes mellitus.  Hemoglobin A1c 6.4.   Diabetic teaching when appropriate  Levemir decreased to 8 on 8/17  Discontinue Novolog- no more hypoglycemic episodes after this CBG (last 3)  Recent Labs    10/07/20 0359 10/07/20 0842 10/07/20 1153  GLUCAP 110* 104* 213*  Reduce levimir to 5U given am CBG of 85  11.  Hyperlipidemia.  Crestor/fenofibrate 12.Aspiration pneumonia.  Completed 14 days antibiotic 13.  Constipation.  MiraLAX 14.  Acute blood loss anemia  Hemoglobin 7.6 on 8/17, up to 7.8 on 8/18, plavix resumed as per GI recommendations. Continue PPI indefinitely. Repeat CBC periodically.   Endoscopy shows no ulcers 15.  Leukocytosis  CBC stable, remains afebrile  16.  AKI: Resolved  Creatinine 0.81  on 8/16  IVF initiated, IVF DC'd 17. Hypernatremia: Sodium 139 on 8/16, improved 18. HTN: started propanolol 10mg  daily.   Vitals:   10/07/20 0818 10/07/20 0819  BP:    Pulse:    Resp:    Temp:    SpO2: 94% 94%   Fair control 8/21 19. Suicidal ideation: chaplain consulted. Palliative care consulted  LOS: 10 days A FACE TO FACE EVALUATION WAS PERFORMED  10/09/20 10/07/2020, 12:59 PM

## 2020-10-08 DIAGNOSIS — Z515 Encounter for palliative care: Secondary | ICD-10-CM

## 2020-10-08 DIAGNOSIS — Z7189 Other specified counseling: Secondary | ICD-10-CM

## 2020-10-08 LAB — GLUCOSE, CAPILLARY
Glucose-Capillary: 113 mg/dL — ABNORMAL HIGH (ref 70–99)
Glucose-Capillary: 140 mg/dL — ABNORMAL HIGH (ref 70–99)
Glucose-Capillary: 154 mg/dL — ABNORMAL HIGH (ref 70–99)
Glucose-Capillary: 163 mg/dL — ABNORMAL HIGH (ref 70–99)
Glucose-Capillary: 96 mg/dL (ref 70–99)
Glucose-Capillary: 96 mg/dL (ref 70–99)

## 2020-10-08 LAB — OCCULT BLOOD X 1 CARD TO LAB, STOOL: Fecal Occult Bld: NEGATIVE

## 2020-10-08 MED ORDER — HYDROCODONE-ACETAMINOPHEN 5-325 MG PO TABS
1.0000 | ORAL_TABLET | Freq: Every day | ORAL | Status: DC | PRN
  Administered 2020-10-08 – 2020-10-17 (×11): 1
  Filled 2020-10-08 (×15): qty 1

## 2020-10-08 NOTE — Progress Notes (Signed)
Pts right wrist has some redness, appeared to come from patients expired blood band rubbing on the skin. Band removed, foam applied to protect skin. New wrist restrain applied. Patient left resting with call bell within reach.

## 2020-10-08 NOTE — Progress Notes (Signed)
[place] PHYSICAL MEDICINE & REHABILITATION PROGRESS NOTE  Subjective/Complaints: Emotionally labile Palliative care consulted Team conference today, looking for SNFs as per mother's preference  ROS: Limited due to cognition.  Objective: Vital Signs: Blood pressure 128/73, pulse 78, temperature 98 F (36.7 C), resp. rate 18, height 6' (1.829 m), weight 81.4 kg, SpO2 93 %. No results found. Recent Labs    10/07/20 0545  WBC 9.2  HGB 9.0*  HCT 27.3*  PLT 311   No results for input(s): NA, K, CL, CO2, GLUCOSE, BUN, CREATININE, CALCIUM in the last 72 hours.   Intake/Output Summary (Last 24 hours) at 10/08/2020 1239 Last data filed at 10/08/2020 0130 Gross per 24 hour  Intake --  Output 500 ml  Net -500 ml         Physical Exam: BP 128/73 (BP Location: Right Arm)   Pulse 78   Temp 98 F (36.7 C)   Resp 18   Ht 6' (1.829 m)   Wt 81.4 kg   SpO2 93%   BMI 24.34 kg/m  Gen: no distress, normal appearing HEENT: oral mucosa pink and moist, NCAT Cardio: Reg rate Chest: normal effort, normal rate of breathing Abd: soft, non-distended Ext: no edema Psych: expresses that he wishes to die, or feels that he is going to die, sexually inappropriate with staff Skin: intact  Musc: No edema in extremities.  No tenderness in extremities. Neuro: Alert Dysarthria, unchanged LUE: Shoulder abduction, elbow flexion/extension 3/5, handgrip 3/5, improving LLE: 3-/5 proximal distal  Assessment/Plan: 1. Functional deficits which require 3+ hours per day of interdisciplinary therapy in a comprehensive inpatient rehab setting. Physiatrist is providing close team supervision and 24 hour management of active medical problems listed below. Physiatrist and rehab team continue to assess barriers to discharge/monitor patient progress toward functional and medical goals   Care Tool:  Bathing    Body parts bathed by patient: Chest, Abdomen, Right upper leg, Face   Body parts bathed by  helper: Right arm, Left arm, Left upper leg, Right lower leg, Left lower leg Body parts n/a: Front perineal area, Buttocks   Bathing assist Assist Level: Maximal Assistance - Patient 24 - 49%     Upper Body Dressing/Undressing Upper body dressing   What is the patient wearing?: Pull over shirt    Upper body assist Assist Level: Maximal Assistance - Patient 25 - 49%    Lower Body Dressing/Undressing Lower body dressing      What is the patient wearing?: Pants     Lower body assist Assist for lower body dressing: 2 Helpers     Toileting Toileting Toileting Activity did not occur (Clothing management and hygiene only): N/A (no void or bm)  Toileting assist Assist for toileting: Dependent - Patient 0%     Transfers Chair/bed transfer  Transfers assist  Chair/bed transfer activity did not occur: Safety/medical concerns  Chair/bed transfer assist level: 2 Helpers     Locomotion Ambulation   Ambulation assist   Ambulation activity did not occur: Safety/medical concerns          Walk 10 feet activity   Assist  Walk 10 feet activity did not occur: Safety/medical concerns        Walk 50 feet activity   Assist Walk 50 feet with 2 turns activity did not occur: Safety/medical concerns         Walk 150 feet activity   Assist Walk 150 feet activity did not occur: Safety/medical concerns  Walk 10 feet on uneven surface  activity   Assist Walk 10 feet on uneven surfaces activity did not occur: Safety/medical concerns         Wheelchair     Assist Is the patient using a wheelchair?: Yes Type of Wheelchair: Manual Wheelchair activity did not occur: Safety/medical concerns         Wheelchair 50 feet with 2 turns activity    Assist    Wheelchair 50 feet with 2 turns activity did not occur: Safety/medical concerns       Wheelchair 150 feet activity     Assist  Wheelchair 150 feet activity did not occur:  Safety/medical concerns        Medical Problem List and Plan: 1.   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 Hx of prior stroke 6/22.  Continue CIR  -Interdisciplinary Team Conference today   2.  Impaired mobility: Discontinue Lovenox given bleeding from PEG site.              -antiplatelet therapy: Continue Aspirin 81 mg daily and Plavix 75 mg dailyx 3 weeks 3. Low back pain: d/c IV Dilaudid. Decrease Norco to1 tab daily PRN.  Appears controlled on 8/23 4. Anxiety: Continue Xanax as needed. Increased seroquel to 25mg  HS 5. Neuropsych: This patient is not capable of making decisions on his 1.40 on 8/12 behalf.  Telemetry sitter for safety, continue  Wrist restraints for safety, continue 6. Skin/Wound Care: Routine skin checks 7. 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 Cocaine/tobacco abuse.UDS positive cocaine.  Counseled on appropriate 10.  Diabetes mellitus.  Hemoglobin A1c 6.4.   Diabetic teaching when appropriate  Levemir decreased to 8 on 8/17  Discontinue Novolog- no more hypoglycemic episodes after this CBG (last 3)  Recent Labs    10/08/20 0359 10/08/20 0826 10/08/20 1204  GLUCAP 96 113* 163*  Reduce levimir to 5U given am CBG of 85  11.  Hyperlipidemia.  Crestor/fenofibrate 12.Aspiration pneumonia.  Completed 14 days antibiotic 13.  Constipation.  MiraLAX 14.  Acute blood loss anemia  Hemoglobin 7.6 on 8/17, up to 7.8 on 8/18, plavix resumed as per GI recommendations. Continue PPI indefinitely. Repeat CBC periodically.   Endoscopy shows no ulcers 15.  Leukocytosis  CBC stable, remains  afebrile  16.  AKI: Resolved  Creatinine 0.81 on 8/16  IVF initiated, IVF DC'd 17. Hypernatremia: Sodium 139 on 8/16, improved 18. HTN: started propanolol 10mg  daily.   Vitals:   10/08/20 0401 10/08/20 0912  BP: 128/73   Pulse: 70 78  Resp: 18 18  Temp: 98 F (36.7 C)   SpO2: 95% 93%   Fair control 8/21 19. Suicidal ideation: chaplain consulted. Palliative care consulted  LOS: 11 days A FACE TO FACE EVALUATION WAS PERFORMED  10/10/20 10/08/2020, 12:39 PM

## 2020-10-08 NOTE — Progress Notes (Signed)
This chaplain responded to the consult for Pt. prayer.  The chaplain was updated by the PMT before the visit.  The Pt. niece-Courtney is facilitating a phone call with the Pt. significant other as the chaplain enters the room.  The Pt. son is at the bedside affirming the Pt. positive effort in therapy.   The chaplain accepts the family's request for prayer with the Pt. before the Pt. begins his bath.  This chaplain is available for F/U spiritual care as needed.

## 2020-10-08 NOTE — Consult Note (Signed)
Palliative Medicine Inpatient Consult Note  Consulting Provider: Izora Ribas, MD  Reason for consult:   Brent [name] Palliative Medicine Consult  Reason for Consult? patient expresses that he wants to die   HPI:  Per intake H&P --> 55 year old male with past medical history of of COPD, diabetes mellitus type 2, hyperlipidemia, hypertension, obstructive sleep apnea, gastroesophageal reflux disease, cocaine abuse with recent hospitalization for acute stroke now currently a patient in the inpatient rehabilitation unit.  Brent [name] was seen by the Palliative care team 7/28 - 8/5 as in inpatient. His goals at that time were clear for full scope of treatment and to ideally improve.   Palliative care has been asked to re-consult as patient has made some passive statements about "wanting to die."  Clinical Assessment/Goals of Care:  *Please note that this is a verbal dictation therefore any spelling or grammatical errors are due to the "[employer] One" system interpretation.  I have reviewed medical records including EPIC notes, labs and imaging, received report from bedside RN, she shares that Brent [name] has been very tangential day to day. He is very tearful at times and stand offish at times.   I met with Brent [name] and called his significant other, Brent [name] to further discuss diagnosis prognosis, GOC, EOL wishes, disposition and options.  We discussed patients multiple co-morbid conditions inclusive of his h/o COPD, Type 2 DM, multiple CVA's and crack-cocaine abuse. We reviewed that he has many things going on and he gets "frustrated" that he cannot function that way he would like to. We discussed that he has suffered from multiple infarctions on the left side of his brain causing him to have a variety of physical deficits. We discussed the reality that he will likely have ongoing deficits but the goals through being at an acute rehabilitation are to become  for functional and self able.    I introduced Palliative Medicine as specialized medical care for people living with serious illness. It focuses on providing relief from the symptoms and stress of a serious illness. The goal is to improve quality of life for both the patient and the family. We discussed that the Palliative team can guide and support patients at any stage of their illness.   We reviewed that Race is from [place], [place]. He has been in a relationship with his significant other, Brent [name] for the past twenty-eight years. He has one daughter and one son from a prior relationship. He has four grandchildren who he considers to be his reason for living. He use to work for a [employer]. IN more recent years he has suffered from disabilities and been cared for by Brent [name].   Prior to hospitalization Brent [name] was able to walk with a walker. He shares that he frequently would go to his mothers home (she lives next door) to provide her reprieve from caring for his grandmother. Brent [name] expresses that his goals are to be able to walk again. He also shares that he would like to eat again he states that he would like more than anything to enjoy a "sandwich".   Therapeutic support was provided through listening as Brent [name] expresses frustration with not being able to mobilize in the same way any more. He shares that he feels constantly anxious and does worry about death. I asked him if he had any plans to hurt himself to which he shared he did not as he values his life and living tremendously. He goes on to  share with me the importance of his grandchildren.   A detailed discussion was had today regarding advanced directives.  Concepts specific to code status, artifical feeding and hydration, continued IV antibiotics and rehospitalization was had. Brent [name] would like to be full code/ full scope of care and again shares with me that he does not want to die.   Brent [name] anticipates being ago to  discharge to his home and for Brent [name] to provide additional care for him. His mother on the other hand feels that he needs a facility to further address his complex care needs.   Per additional conversation with Brent [name]'s significant other, Brent [name] there seems to be some disagreement amongst family members in regards to the discharge plan. We have agreed to discuss this at greater length on Sunday. We have agreed that all family members should come in so that additional decisions can be made.   Discussed the importance of continued conversation with family and their  medical providers regarding overall plan of care and treatment options, ensuring decisions are within the context of the patients values and GOCs.  Decision Maker: Collene [name] (mother) [phone]  SUMMARY OF RECOMMENDATIONS   Full Code / Full Scope of Care  Plan to meet at [facility] on Sunday with patients mother, significant other, and two children  Anxiety management per primary team  Ongoing support by PMT team  Code Status/Advance Care Planning: FULL CODE    Palliative Prophylaxis:  Oral Care, Mobility  Additional Recommendations (Limitations, Scope, Preferences): Continue current scope of care presently  Psycho-social/Spiritual:  Desire for further Chaplaincy support: No Additional Recommendations: Education on multiple strokes   Prognosis: Based upon recurrent hospitalizations, PMH, and recurrence of strokes Brent [name] has an elevated 12 year mortality risk.   Discharge Planning: Discharge plan uncertain.   Vitals:   10/08/20 0401 10/08/20 0912  BP: 128/73   Pulse: 70 78  Resp: 18 18  Temp: 98 F (36.7 C)   SpO2: 95% 93%    Intake/Output Summary (Last 24 hours) at 10/08/2020 1148 Last data filed at 10/08/2020 0130 Gross per 24 hour  Intake --  Output 500 ml  Net -500 ml   Last Weight  Most recent update: 10/01/2020  4:17 AM    Weight  81.4 kg (179 lb 7.3 oz)            Gen:  Middle aged caucasian M  in NAD HEENT: moist mucous membranes CV: Regular rate and rhythm  PULM: On 2LPM [place] ABD: soft/nontender  EXT: No edema  Neuro: Alert and oriented x2-3  PPS: 20%   This conversation/these recommendations were discussed with patient primary care team, Dr. Adam Phenix  Time In: 1330 Time Out: 1440 Total Time: 87 Greater than 50%  of this time was spent counseling and coordinating care related to the above assessment and plan.  [place] Team Team Cell Phone: [phone] Please utilize secure chat with additional questions, if there is no response within 30 minutes please call the above phone number  Palliative Medicine Team providers are available by phone from 7am to 7pm daily and can be reached through the team cell phone.  Should this patient require assistance outside of these hours, please call the patient's attending physician.

## 2020-10-08 NOTE — Patient Care Conference (Signed)
Inpatient RehabilitationTeam Conference and Plan of Care Update Date: 10/08/2020   Time: 12:00 PM    Patient Name: Brent [name]      Medical Record Number: [id number]  Date of Birth: 05/07/1965 Sex: Male         Room/Bed: 4W21C/4W21C-01 Payor Info: Payor: [place] MEDICAID PREPAID HEALTH PLAN / Plan:  MEDICAID [employer] [place]  / Product Type: *No Product type* /    Admit Date/Time:  09/27/2020  1:13 PM  Primary Diagnosis:  Acute blood loss anemia  Hospital Problems: Principal Problem:   Acute blood loss anemia Active Problems:   Uncontrolled type 2 diabetes mellitus, with long-term current use of insulin (HCC)   Mixed 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 Sula Soda Social Worker Present: Dossie Der, LCSW Nurse Present: Chana Bode, RN PT Present: Ernestene Kiel, PT OT Present: Ezra Sites, OT SLP Present: Feliberto Gottron, SLP PPS Coordinator present : Edson Snowball, PT     Current Status/Progress Goal Weekly Team Focus  Bowel/Bladder   Incontinent with some continent episodes. LBM 10/05/2020  Increase continent episodes and decrease incontinent episodes.  Toilet q2hr while awake. q3 at hs   Swallow/Nutrition/ Hydration   NPO mod A  Min A  puree trials ST only, minimal ice chips for comfort, education and pharyngeal exercises   ADL's   Max A for bathing and UB dressing, Total assist +2 for LB dressing at sit > stand level (Total of 1 at bed level), Max-total +2 squat pivot transfers  set up-min A UB ADLs; mod A LB ADLs sit<>stand  level; transfers min-mod A  ADL retraining, trunk control, sitting balance, sit > stand, functional transfers, safety awareness and fall prevention   Mobility   2 helpers for transfers, sit <>stands, mod-max for bed mobility  Min A  Transfers, sustained attention, safety awareness, dynamic sitting balance, appropriate behavior   Communication             Safety/Cognition/ Behavioral Observations  max-mod A  sup for orientation, min A for problem solving, memory, attention, and awareness  education, orientation, attention, problem solving   Pain   c/o pain to back  Pain <3/10  Assess pain qshift and PRN   Skin   Skin intact. Peg tube to left upper extremity.  Maintain skin integrity  Assess Qshift and PRN     Discharge Planning:  Mom and girlfreind on different pages-mom wants to go to a NH made aware will be difficult to place in SNF   Team Discussion: Patient noting fear of dying; Palliative consulted and Chaplain consulted. Patient is emotionally labile and making inappropriate sexual comments to staff. Post bleed from PEG site; wearing wrist restraints and binder to avoid pulling at tube. Hyperglycemia addressed per MD.  Patient on target to meet rehab goals: Currently requires 2 helpers for sit to stand and mod - max assist for bed mobility Patient needs max assist for upper body bathing and dressing and +2 assist for lower body care. Requires max - total assist for squat pivot transfers *See Care Plan and progress notes for long and short-term goals.  Discharge goals set for min assist.  Revisions to Treatment Plan:  Working on transfers, sustained attention, safety awareness and, trunk control   Teaching Needs: Medications, nutritional means, safety, etc  Current Barriers to Discharge: Home enviroment access/layout, Incontinence, Lack of/limited family support, Behavior, and Nutritional means  Possible Resolutions to Barriers: SNF recommended for 24/7 care     Medical  Summary Current Status: PEG in place, sexually inappropriate, emotionally labile, elevated CBGs, hypertension, agitation  Barriers to Discharge: Behavior;Decreased family/caregiver support;Nutrition means;Wound care  Barriers to Discharge Comments: PEG in place, sexually inappropriate, emotionally labile, elevated CBGs, hypertension, agitation Possible Resolutions to [employer] Focus: continue PEG, continue chaplain care, consulted palliative care, decreased Levemir, added propanolol   Continued Need for Acute Rehabilitation Level of Care: The patient requires daily medical management by a physician with specialized training in physical medicine and rehabilitation for the following reasons: Direction of a multidisciplinary physical rehabilitation program to maximize functional independence : Yes Medical management of patient stability for increased activity during participation in an intensive rehabilitation regime.: Yes Analysis of laboratory values and/or radiology reports with any subsequent need for medication adjustment and/or medical intervention. : Yes   I attest that I was present, lead the team conference, and concur with the assessment and plan of the team.   Chana Bode B 10/08/2020, 2:32 PM

## 2020-10-08 NOTE — Progress Notes (Signed)
Speech Language Pathology Daily Session Note  Patient Details  Name: Brent [name] MRN: [id number] Date of Birth: [birth year]  Today's Date: 10/08/2020 SLP Individual Time: 1400-1500 SLP Individual Time Calculation (min): 60 min  Short Term Goals: Week 2: SLP Short Term Goal 1 (Week 2): Patient will use visual aids to orient to time/place/situation with modA cues. SLP Short Term Goal 2 (Week 2): Patient will demonstrate awareness to errors during basic level functional tasks with modA cues. SLP Short Term Goal 3 (Week 2): Patient will demonstrate sustained attention during functional tasks for increments of 2-3 minutes with modA cues. SLP Short Term Goal 4 (Week 2): Patient will tolerate trials of smooth thick purees/pudding thick liquids with SLP only with minimal overt s/s aspiration/penetration.  Skilled Therapeutic Interventions:Skilled ST services focused on swallow and cognitive skills. Pt was orientated to place, situation, month, day of week and year. SLP facilitated basic problem solving sustained attention in 2 step card sequence tasks and card task identifying highest card among 2 cards. Pt was able to sort 2 step cards in 2 out 5 opportunities, with very limited comprehension of errors when made aware. Pt was unable to sequence 3 step cards even in return demonstration task. Pt was able to identify highest card among 2 cards with mod A fade to min A verbal cues and required max A fade to mod A verbal cues for sustained attention in 2 minute intervals. Pt completed oral care with supervision A via suction toothbrush. Pt consumed pureed texture trials, demonstrating increase AP transport and swallow initiation delay averaging 8 seconds. Pt demonstrated overt s/s aspiration of 4 out 5 trials ( immediate cough and/or wet vocal quality.) SLP attempt to teach pt CTAR exercise to increase pharyngeal strength however pt required max A verbal cues to complete exercise accurately. Pt requested to  use urinal with urgency several times during the session, pt was able to urinate a very small amount but need constant cues for attention and overall problem solving. Requesting for urinal when it was already applied and the need to sit on the bed verse chair to urinate. SLP dialed girlfriend and mothers phone numbers for pt. Pt was left in room with call bell within reach and chair alarm set. SLP recommends to continue skilled services.      Pain Pain Assessment Pain Score: 0-No pain  Therapy/Group: Individual Therapy  Carlean Crowl  [facility] 10/08/2020, 3:04 PM

## 2020-10-08 NOTE — Progress Notes (Signed)
Occupational Therapy Session Note  Patient Details  Name: Brent [name] MRN: [id number] Date of Birth: 07-31-1965  Today's Date: 10/08/2020 OT Individual Time: 1300-1345 OT Individual Time Calculation (min): 45 min    Short Term Goals: Week 2:  OT Short Term Goal 1 (Week 2): Pt will maintain static sitting balance EOB with supervision for 4 mins in preparation for selfcare tasks. OT Short Term Goal 2 (Week 2): Pt will complete UB bathing with min assist for two consecutive sessions. OT Short Term Goal 3 (Week 2): Pt will demonstrate selective attention in minimally distracting environment x 5 min following overt verbal cue in familiar task. OT Short Term Goal 4 (Week 2): Patient will stand with mod assist in preparation for LB dressing.  Skilled Therapeutic Interventions/Progress Updates:    Patient in bed, nursing entered just prior and have started to clean patient due to loose BM.  Patient requires 2 helpers to complete hygiene, clean bed rails/call bell etc., linens.  He is able to roll left with min A, max A to right - dependent to donn clean brief and abdominal binder, max A for pants.  Supine to sitting edge of bed with mod/max A.  Clothing management in stance with max A.  Able to maintain unsupported sitting with CS.  Max A to donn sneakers and clean [place] shirt.  Max A sit pivot transfer to w/c.  Hand hygiene with min a at sink.   Patient completed reaching and seated balance activity with CS and cues for safety.  He remained seated in TIS w/c at close of session, seat belt alarm set, right wrist restraint in place - nursing present to assist patient with urinal and provide clean left wrist restraint.    Therapy Documentation Precautions:  Precautions Precautions: Fall, Other (comment) Precaution Comments: supplemental O2 via [place], L hemi, impaired safety awareness, impulsive, NPO Restrictions Weight Bearing Restrictions: No   Therapy/Group: Individual Therapy  Barrie Lyme 10/08/2020, 7:35 AM

## 2020-10-08 NOTE — Progress Notes (Addendum)
Physical Therapy Session Note  Patient Details  Name: Brent [name] MRN: [id number] Date of Birth: 02-Oct-1965  Today's Date: 10/08/2020 PT Individual Time: 1106-1206 and [id number] PT Individual Time Calculation (min): 60 min and 38 min  Short Term Goals: Week 1:  PT Short Term Goal 1 (Week 1): Pt will tolerate upright, OOB sitting for at least 1 hour between therapy sessions. PT Short Term Goal 1 - Progress (Week 1): Progressing toward goal PT Short Term Goal 2 (Week 1): Pt will perform sit<>stands with +2 total assist PT Short Term Goal 2 - Progress (Week 1): Met PT Short Term Goal 3 (Week 1): Pt will complete bed<>chair transfers with +2 total assist PT Short Term Goal 3 - Progress (Week 1): Met PT Short Term Goal 4 (Week 1): Pt will demonstrate improved sitting balance with ability to maintain static sitting for at least 2 minutes without UE support PT Short Term Goal 4 - Progress (Week 1): Progressing toward goal Week 2:  PT Short Term Goal 1 (Week 2): Pt will tolerate upright, OOB sitting for at least 1 hour between therapy sessions. PT Short Term Goal 2 (Week 2): Pt will demonstrate improved sitting balance with ability to maintain static sitting for at least 2 minutes without UE support PT Short Term Goal 3 (Week 2): Pt will perform chair <>bed transfers w/max A x1 Week 3:     Skilled Therapeutic Interventions/Progress Updates:    .PAIN initially c/o pain in back in bed, assisted oob/see below w/resolution of pain. Pt initially awake, restrained w/wrist restraints, and c/o back pain.  Asks if I am the Chaplain and here to get him oob".  Restraints removed and pt transfers supine to sit on ede of bed w/cues to attend to L UE position/advancement and min assist.  Frequent cuing required throughout session due to impulsivity but pt compliant w/instruction. In sitting, pt max assist to don pants to knees, total assist for socks and shoes, and cga to max assist for sitting balance,  cues to attend to balance during tasks, post lob w/max assist for recovery primarily due to distraction. Sit to stand from elevated bed w/mod assist of 2, multimodal cues, mult attempts due to poor motor planning but achieves full upright.  stand pivot transfer to L to wc w/mod assist of 2, poor safety awareness, poor attention to task. Pt transported to gym. Sit to stand at hi/lo table w/max assist, therapist stabilizing LLE, pt distracted w/activitiy outside window to R, max assist to return to safe sitting in wc. Attempted Sit to stand w/2 assist, but poor motor planning, inner distractions, fatigue prevent success w/this.  Headrest of wc adjusted by therapist.  Legrests in fully extended setting but too short and would benefit from longer legrests for improved sitting posture.   Pt returned to room.  Sit to stand in stedy from wc and transfer to bedside w/mod assist of 2, nursing assisting w/transfer. Pt maintains sitting balance w/use of bedrail and cues while therapist removes shoes.  Sit to side to supine w/max assist.  Pt handed off to nursing as session timed out.  Pt mentioned mult times during session "I thought wanted to die this am, but I dont really want to die now!" Was tearful, apologized for "talking nonsense"  perseverates on calling wife/son, wanting to know when they will be here.  Pt oriented to place and situation but very tangetial, perseverative, inattentive, emotionally labile.  PM SESSION PAIN pt denies pain, perseverates on "put me  back in the bed"  Pt initially oob in wc, requesting assistance back to bed.  When told I was his PT he stated "take me out of here and play cornhole".   Pt transported to gym.  Upon entry to gym, pt points to NuStep and states "I want to do that, put me on there". Pt requires simple direct verbal cues for sequencing and safety w/scooting then Sit to stand from wc, flexed posture and +2 assist required for safety, direct cues for urright,   Repeated Sit to stand x 2 from wc then stand pivot transfer to NuStep w/heavy mod assist, cues for sequencing, safety, attention to task. Pt set up in Nustep using acewraps to secure feet and L hand.  Initially very distracted by others to R, stating he wants to get in bed, very poor focus.  Family arrived and pt immediately sobbing loudly "I mssed you so much".  Family instructed to stay to pt left and then encouraged pt to continue w/activity, directed pt attention toward activity.  Therapist used counting aloud to facilitate attention to task/family joined in and pt able to complete 100steps in this manner.   Pt Sit to stand from [employer] and transpoted to edge of bed w/+2 guarding including family member on L/therapist on R supporting trunk. Sit to supine w/max assist. Wrist restraints fastened.  Pt left supine w/rails up x 4 alarm set, bed in lowest position, and needs in reach.  Nursing in to check vitals  Therapy Documentation Precautions:  Precautions Precautions: Fall, Other (comment) Precaution Comments: supplemental O2 via [place], L hemi, impaired safety awareness, impulsive, NPO Restrictions Weight Bearing Restrictions: No    Therapy/Group: Individual Therapy Callie Fielding, [place] 10/08/2020, 12:47 PM

## 2020-10-08 NOTE — Progress Notes (Signed)
Patient keeps saying he feels like he is going to die but has not verbalized wanting to hurt himself. Patient is very emotional and tearful.  Patients mother and wife were called to console him. He has requested to see the Chaplain again.

## 2020-10-08 NOTE — Progress Notes (Signed)
Patient ID: Brent [name], male   DOB: [birth year], 55 y.o.   MRN: [id number]  Spoke with Mom via telephone to discuss team conference progress and if plan still for NHP. Discussed with Mom to talk with pt regarding not going home. She reports he knows this but wants to go to a place in [place] or [place]. Aware if they will not take him to will need to expand the search and see who will take him. Will need to be restraint free before he leaves. Still has restraint on his hand regarding his PEG. Will complete FL2 and begin search. Mom to talk with pt again about the plan.

## 2020-10-09 LAB — GLUCOSE, CAPILLARY
Glucose-Capillary: 129 mg/dL — ABNORMAL HIGH (ref 70–99)
Glucose-Capillary: 100 mg/dL — ABNORMAL HIGH (ref 70–99)
Glucose-Capillary: 196 mg/dL — ABNORMAL HIGH (ref 70–99)
Glucose-Capillary: 117 mg/dL — ABNORMAL HIGH (ref 70–99)
Glucose-Capillary: 119 mg/dL — ABNORMAL HIGH (ref 70–99)

## 2020-10-09 MED ORDER — LIDOCAINE 5 % EX PTCH
1.0000 | MEDICATED_PATCH | CUTANEOUS | Status: DC
  Administered 2020-10-09 – 2020-10-18 (×8): 1 via TRANSDERMAL
  Filled 2020-10-09 (×10): qty 1

## 2020-10-09 NOTE — Progress Notes (Signed)
Physical Therapy Session Note  Patient Details  Name: Brent [name] MRN: [id number] Date of Birth: 06/30/1965  Today's Date: 10/09/2020 PT Individual Time: 1300-1410 PT Individual Time Calculation (min): 70 min   Short Term Goals: Week 2:  PT Short Term Goal 1 (Week 2): Pt will tolerate upright, OOB sitting for at least 1 hour between therapy sessions. PT Short Term Goal 2 (Week 2): Pt will demonstrate improved sitting balance with ability to maintain static sitting for at least 2 minutes without UE support PT Short Term Goal 3 (Week 2): Pt will perform chair <>bed transfers w/max A x1  Skilled Therapeutic Interventions/Progress Updates:     Pt supine in bed to start session. RN at bedside providing PEG tube feedings. Wife at bedside as well. Pt agreeable to therapy tx, requesting assistance to getting to his w/c. He also reported need/urge to have a BM. Supine<>sit completed with +2 maxA. Requires modA for sitting balance due to posterior lean. Completed squat<>pivot transfer with maxA and +2 on standby from EOB to [facility]. Dependant for managing brief/pants. Pt unable to produce BM despite time provided, brief was wet from prior incontinence episode. Clean brief provided and sit<>stand with totalA from [facility] to raise pants over hips. Completed squat<>pivot transfer with totalA to EOB and then assisted to w/c in similar manner. Pt wheeled to ortho rehab gym for time in TIS w/c. Wife present for active observation and encouragement during session. Assisted to mat table with totalA squat<>pivot transfer. Worked on sitting balance with cone reaching in multi plane directions - required modA for sitting balance due to L LOB and posterior lean. Attempted to work on sit<>stand transfers but he was unable to full clear hips or get adequate thoracic extension to produce standing posture. Multiple efforts and techniques, but even with +2 assist unable to achieve. Pt reporting fatigue and wanting to get back to  his room. Assisted to w/c via totalA squat<>pivot transfer. Returned to his room and remained reclined in TIS w/c with safety belt alarm on and bilateral wrist restraints applied. Wife remaining at bedside. All needs met.  Therapy Documentation Precautions:  Precautions Precautions: Fall, Other (comment) Precaution Comments: supplemental O2 via [place], L hemi, impaired safety awareness, impulsive, NPO Restrictions Weight Bearing Restrictions: No General:     Therapy/Group: Individual Therapy  Alger Simons 10/09/2020, 7:58 AM

## 2020-10-09 NOTE — Progress Notes (Signed)
Patient ID: Brent [name], male   DOB: 1965-05-02, 55 y.o.   MRN: [id number] Spoke with Mom who is requesting pt be seen by psychiatrist to evaluate him. She asked while on acute and is now asking. She wants to know if he is capable of making his own decisions, according to MD he is not at this time. Met with pt to update regarding team conference and he is aware of needing to go to [facility] but made aware he may have to go somewhere else if [facility] will not take him. He wants [name] called so he could talk with her. He has staff call her several times per day. Have asked MD for psychiatry consult. Will send out Fl2 to see if can get any bed offers.

## 2020-10-09 NOTE — Progress Notes (Signed)
Speech Language Pathology Daily Session Note  Patient Details  Name: Brent [name] MRN: [id number] Date of Birth: [birth year]  Today's Date: 10/09/2020 SLP Individual Time: 1002-1100 SLP Individual Time Calculation (min): 58 min  Short Term Goals: Week 2: SLP Short Term Goal 1 (Week 2): Patient will use visual aids to orient to time/place/situation with modA cues. SLP Short Term Goal 2 (Week 2): Patient will demonstrate awareness to errors during basic level functional tasks with modA cues. SLP Short Term Goal 3 (Week 2): Patient will demonstrate sustained attention during functional tasks for increments of 2-3 minutes with modA cues. SLP Short Term Goal 4 (Week 2): Patient will tolerate trials of smooth thick purees/pudding thick liquids with SLP only with minimal overt s/s aspiration/penetration.  Skilled Therapeutic Interventions:Skilled ST services focused on swallow and cognitive skills. Pt was orientated to place and situation but required mod A verbal cues for time with improvements throughout session to supervision A verbal cues. Pt recalled 1 out 2 events from yesterday's ST session and recall time of arrival of girlfriend today (between 12:00-1:00), however continued to demonstrate poor problem solving requesting to call her x6 times during the session to check arrival time but could remember planned time with verbal cue. SLP facilitated sustained attention, basic problem solving and error awareness in familiar card sorting task by 4-5 colors, pt demonstrated mod I with 4 colors and supervision A verbal cues with 5 colors for problem solving/error awareness. Pt demonstrated increasing sustained attention from 1-2 minutes. Pt completed oral care with set up assist. Pt consumed pureed textures demonstrated 1 out 5 trials with immediate cough due in ability to cease verbalization during PO consumption despite max A verbal cues. Pt was left in room with call bell within reach and bed alarm  set. SLP recommends to continue skilled services.      Pain Pain Assessment Pain Score: 0-No pain  Therapy/Group: Individual Therapy  Brent [name]  [facility] 10/09/2020, 3:07 PM

## 2020-10-09 NOTE — Progress Notes (Addendum)
Orthopedic Tech Progress Note Patient Details:  Brent [name] [birth year] [id number] The secretary called requesting an ABDOMINAL BINDER    Ortho Devices Type of Ortho Device: Abdominal binder Ortho Device/Splint Location: STOMACH Ortho Device/Splint Interventions: Ordered   Post Interventions Patient Tolerated: Well Instructions Provided: Care of device  [name] 10/09/2020, 2:55 PM

## 2020-10-09 NOTE — Progress Notes (Signed)
Occupational Therapy Session Note  Patient Details  Name: Brent [name] MRN: [id number] Date of Birth: 1965-03-08  Today's Date: 10/09/2020 OT Individual Time: 0901-1000 OT Individual Time Calculation (min): 59 min    Short Term Goals: Week 2:  OT Short Term Goal 1 (Week 2): Pt will maintain static sitting balance EOB with supervision for 4 mins in preparation for selfcare tasks. OT Short Term Goal 2 (Week 2): Pt will complete UB bathing with min assist for two consecutive sessions. OT Short Term Goal 3 (Week 2): Pt will demonstrate selective attention in minimally distracting environment x 5 min following overt verbal cue in familiar task. OT Short Term Goal 4 (Week 2): Patient will stand with mod assist in preparation for LB dressing.  Skilled Therapeutic Interventions/Progress Updates:  Pt greeted supine in bed adamant about getting OOB. Pt continues to be impulsive and difficult to redirect at times. Overall MAX A +1 for bed mobility with pt exiting to L side of bed. Pt required at least MIN A for static sitting balance EOB.  Pt completed UB dressing with MOD A, MAX A for LB dressing to don pants. Pt required MAX A to don footwear. Pt completed squat pivot transfer from EOb>w/c to pts L side with MOD A +2. Pt completed oral care at sink with set- up assist of suction oral swab, cues needed to attend to task as pt now perseverating on needing to call his wife. Pt transported to therapy gym with total A. Pt completed additional squat pivot transfer from w/c>EOM with MOD A +2 going towards pt R side. Remainder of session to focus on various therapeutic activities focused on static and dynamic sitting balance, utilized mirror to provide visual feedback. Pt completed x10 lateral leans to LUE with pt needing MOD A to return to midline each trial, pt required MAX multimodal cues to return to midline. Pt additionally completed ball tosses with pt catch and releasing ball with RUE with an emphasis on  maintaining midline posture. Pt returned back to room with total A where pt completed additional squat pivot transfer in similar fashion as previously indicated. Pt left supine in bed with wrist restraints reapplied, all needs within reach and bed alarm activated,   Therapy Documentation Precautions:  Precautions Precautions: Fall, Other (comment) Precaution Comments: supplemental O2 via [place], L hemi, impaired safety awareness, impulsive, NPO Restrictions Weight Bearing Restrictions: No  Therapy Vitals Temp: 97.6 F (36.4 C) Temp Source: Oral Pulse Rate: 75 Resp: 18 BP: 98/65 Patient Position (if appropriate): Sitting Oxygen Therapy SpO2: 94 % O2 Device: Room Air Pain: Pt reports no pain during session.    Therapy/Group: Individual Therapy  Barron Schmid 10/09/2020, 2:43 PM

## 2020-10-09 NOTE — NC FL2 (Signed)
[place] MEDICAID FL2 LEVEL OF CARE SCREENING TOOL     IDENTIFICATION  Patient Name: Brent [name] Birthdate: 04-02-1965 Sex: male Admission Date (Current Location): 09/27/2020  [place] and [place] Number:  [name] [id number] [employer] [place] MEDICAID Facility and Address:  [facility] [place], [address], [place], [place] [address]      Provider Number: [id number]  Attending Physician Name and Address:  Ranelle Oyster, MD  Relative Name and Phone Number:  Wilkie Aye [phone]    Current Level of Care: Other (Comment) (Rehab) Recommended Level of Care: Skilled Nursing Facility Prior Approval Number:    Date Approved/Denied:   PASRR Number: [id number]  Discharge Plan: SNF    Current Diagnoses: Patient Active Problem List   Diagnosis Date Noted   Acute blood loss anemia 10/02/2020   PEG (percutaneous endoscopic gastrostomy) status (HCC)    Malnutrition of moderate degree 09/30/2020   S/P percutaneous endoscopic gastrostomy (PEG) tube placement (HCC)    Labile blood glucose    Leukocytosis    Goals of care, counseling/discussion 09/12/2020   Acute stroke due to ischemia (HCC) 09/11/2020   Acute respiratory failure with hypoxia (HCC) 09/11/2020   Aspiration pneumonitis (HCC) 09/11/2020   Dysarthria 09/11/2020   Sepsis due to undetermined organism (HCC) 09/11/2020   Cocaine abuse (HCC)    Acute CVA (cerebrovascular accident) (HCC) 07/25/2020   Acute on chronic respiratory failure with hypoxia (HCC) 11/04/2019   Rhinovirus infection 11/04/2019   Essential hypertension 11/04/2019   CAP (community acquired pneumonia) 11/03/2019   Diabetic polyneuropathy associated with diabetes mellitus due to underlying condition (HCC) 02/06/2019   Localization-related (focal) (partial) symptomatic epilepsy and epileptic syndromes with complex partial seizures, intractable, without status epilepticus (HCC) 02/06/2019   Mixed  diabetic hyperlipidemia associated with type 2 diabetes mellitus (HCC) 11/26/2018   Cough 11/03/2018   Obstructive sleep apnea 11/03/2018   Dysphagia-----s/p Prior Uvulectomy and Now with Acute CVA 09/27/2018   Eczema 09/27/2018   Right leg weakness 01/19/2018   History of arthroscopy of knee 11/15/2017   Complex tear of lateral meniscus of right knee as current injury 10/27/2017   Effusion of right knee 07/26/2017   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   Cellulitis 07/22/2017   Primary osteoarthritis involving multiple joints 06/22/2017   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 02/01/2017   Right knee pain 02/01/2017   Acute bronchitis 12/07/2016   Acute idiopathic gout of left foot 12/07/2016   Anxiety, generalized 12/07/2016   COPD with acute exacerbation (HCC) 12/07/2016   Seizures (HCC) 12/07/2016   CAD (coronary artery disease) 12/19/2010   Uncontrolled type 2 diabetes mellitus, with long-term current use of insulin (HCC) 12/19/2010   Hypertension associated with diabetes (HCC) 12/19/2010   GERD (gastroesophageal reflux disease) 12/19/2010   Hypertriglyceridemia 12/19/2010   Tobacco abuse 12/19/2010    Orientation RESPIRATION BLADDER Height & Weight     Self, Time, Situation, Place  Normal Incontinent Weight: 179 lb 7.3 oz (81.4 kg) Height:  6' (182.9 cm)  BEHAVIORAL SYMPTOMS/MOOD NEUROLOGICAL BOWEL NUTRITION STATUS      Incontinent Feeding tube  AMBULATORY STATUS COMMUNICATION OF NEEDS Skin   Total Care Verbally Normal                       Personal Care Assistance Level of Assistance  Bathing, Dressing Bathing Assistance: Maximum assistance Feeding assistance: Maximum assistance Dressing Assistance: Maximum assistance     Functional Limitations  Info             SPECIAL CARE FACTORS FREQUENCY  PT (By licensed PT), OT (By licensed OT), Bowel and bladder program, Speech therapy     PT Frequency: 5 x week OT Frequency:  5 x week Bowel and Bladder Program Frequency: Timed tolieting to obtain continence   Speech Therapy Frequency: 5 x week      Contractures Contractures Info: Not present    Additional Factors Info  Code Status, Allergies Code Status Info: Full Code Allergies Info: NKDA           Current Medications (10/09/2020):  This is the current hospital active medication list Current Facility-Administered Medications  Medication Dose Route Frequency Provider Last Rate Last Admin   acetaminophen (TYLENOL) 160 MG/5ML solution 650 mg  650 mg Per Tube Q4H PRN Jenel Lucks, MD   650 mg at 10/09/20 0247   ALPRAZolam (XANAX) tablet 0.25 mg  0.25 mg Per Tube TID PRN Jenel Lucks, MD   0.25 mg at 10/09/20 0931   arformoterol (BROVANA) nebulizer solution 15 mcg  15 mcg Nebulization BID Tiajuana Amass E, MD   15 mcg at 10/08/20 2037   budesonide (PULMICORT) nebulizer solution 0.5 mg  0.5 mg Nebulization BID Tiajuana Amass E, MD   0.5 mg at 10/08/20 2034   carBAMazepine (TEGRETOL) 100 MG/5ML suspension 200 mg  200 mg Per Tube Q6H Tiajuana Amass E, MD   200 mg at 10/09/20 0514   clopidogrel (PLAVIX) tablet 75 mg  75 mg Per Tube Daily Raulkar, Drema Pry, MD   75 mg at 10/09/20 0931   feeding supplement (OSMOLITE 1.5 CAL) liquid 355 mL  355 mL Per Tube QID Faith Rogue T, MD 0 mL/hr at 10/03/20 2100 355 mL at 10/09/20 0932   feeding supplement (PROSource TF) liquid 45 mL  45 mL Per Tube BID Ranelle Oyster, MD   45 mL at 10/09/20 8502   fenofibrate tablet 160 mg  160 mg Per Tube Daily Tiajuana Amass E, MD   160 mg at 10/09/20 0931   free water 230 mL  230 mL Per Tube QID Tiajuana Amass E, MD   230 mL at 10/09/20 0933   guaiFENesin (ROBITUSSIN) 100 MG/5ML solution 200 mg  10 mL Per Tube Q4H Tiajuana Amass E, MD   200 mg at 10/09/20 0930   HYDROcodone-acetaminophen (NORCO/VICODIN) 5-325 MG per tablet 1 tablet  1 tablet Per Tube Daily PRN Horton Chin, MD   1 tablet at  10/09/20 0931   insulin aspart (novoLOG) injection 0-20 Units  0-20 Units Subcutaneous Q4H Jenel Lucks, MD   3 Units at 10/09/20 0933   insulin detemir (LEVEMIR) injection 5 Units  5 Units Subcutaneous BID Erick Colace, MD   5 Units at 10/09/20 0930   ipratropium-albuterol (DUONEB) 0.5-2.5 (3) MG/3ML nebulizer solution 3 mL  3 mL Nebulization Q4H PRN Jenel Lucks, MD   3 mL at 10/01/20 1946   melatonin tablet 3 mg  3 mg Per Tube QHS PRN Jenel Lucks, MD   3 mg at 10/08/20 1952   nitroGLYCERIN (NITROSTAT) SL tablet 0.4 mg  0.4 mg Sublingual Q5 min PRN Jenel Lucks, MD       pantoprazole sodium (PROTONIX) 40 mg oral suspension 40 mg  40 mg Per Tube BID Charlton Amor, PA-C   40 mg at 10/09/20 0934   polyethylene glycol (MIRALAX / GLYCOLAX) packet 17 g  17 g Per  Tube Daily Jenel Lucks, MD   17 g at 10/09/20 0932   propranolol (INDERAL) tablet 10 mg  10 mg Oral Daily Jenel Lucks, MD   10 mg at 10/09/20 0931   QUEtiapine (SEROQUEL) tablet 25 mg  25 mg Per Tube QHS Jenel Lucks, MD   25 mg at 10/08/20 2135   rosuvastatin (CRESTOR) tablet 20 mg  20 mg Per Tube Daily Jenel Lucks, MD   20 mg at 10/09/20 4627     Discharge Medications: Please see discharge summary for a list of discharge medications.  Relevant Imaging Results:  Relevant Lab Results:   Additional Information SSN: [id number] Has had Two COVID vaccines. Mom worked at [employer] and [employer] before retiring  Alysandra Lobue, [place], [place]

## 2020-10-09 NOTE — Progress Notes (Signed)
Nutrition Follow-up  DOCUMENTATION CODES:   Non-severe (moderate) malnutrition in context of social or environmental circumstances  INTERVENTION:  Continue Osmolite 1.5 cal formula via PEG at goal volume of 355 ml (1.5 cartons/ARCs) given QID.    Provide 45 ml Prosource TF BID per tube.   Provide 115 ml free water flush before and after each feeding administration   Tube feeding regimen provides 2210 kcal (100% of needs), 111 grams of protein, and 1086 ml of H2O. Total free water: 2006 ml daily.  NUTRITION DIAGNOSIS:   Moderate Malnutrition related to social / environmental circumstances (CVA) as evidenced by moderate muscle depletion, percent weight loss, moderate fat depletion; ongoing  GOAL:   Patient will meet greater than or equal to 90% of their needs; met with TF  MONITOR:   Weight trends, Labs, I & O's, Diet advancement, Skin, TF tolerance  REASON FOR ASSESSMENT:   Consult Enteral/tube feeding initiation and management  ASSESSMENT:   Patient with PMH significant for HTN, HLD, DM, polysubstance use. Recently admitted to [facility] for acute ischemic/embolic stroke with L hemiparesis and severe dysphagia. Had PEG placed per IR on 8/9. Presents to CIR for further rehab therapy.  Pt continues on NPO status and has been tolerating his bolus tube feeds via PEG. RD to continue with current tube feeding orders. Plans for SNF upon discharge per MD.   Labs and medications reviewed.   Diet Order:   Diet Order             Diet NPO time specified  Diet effective now                   EDUCATION NEEDS:   Not appropriate for education at this time  Skin:  Skin Assessment: Reviewed RN Assessment  Last BM:  8/23  Height:   Ht Readings from Last 1 Encounters:  09/27/20 6' (1.829 m)    Weight:   Wt Readings from Last 1 Encounters:  10/01/20 81.4 kg   BMI:  Body mass index is 24.34 kg/m.  Estimated Nutritional Needs:   Kcal:  2200-2400 kcal  Protein:   105-115 grams  Fluid:  >/= 2 L/day  Corrin Parker, MS, RD, LDN RD pager number/after hours weekend pager number on Amion.

## 2020-10-09 NOTE — Progress Notes (Addendum)
Physical Therapy Session Note  Patient Details  Name: Brent [name] MRN: [id number] Date of Birth: 1965/09/26  Today's Date: 10/09/2020 PT Individual Time: 0938-1829 PT Individual Time Calculation (min): 34 min   Short Term Goals: Week 2:  PT Short Term Goal 1 (Week 2): Pt will tolerate upright, OOB sitting for at least 1 hour between therapy sessions. PT Short Term Goal 2 (Week 2): Pt will demonstrate improved sitting balance with ability to maintain static sitting for at least 2 minutes without UE support PT Short Term Goal 3 (Week 2): Pt will perform chair <>bed transfers w/max A x1  Skilled Therapeutic Interventions/Progress Updates:    Patient reports ready to go to bed.  Much encouragement and time keeping to get pt to participate.  Patient assisted in w/c to dayroom.  Performed Kinetron @ 90 cm/sec with max cues and min to occasional mod A intermittently x 2 minutes.  Patient sit to stand x 5 to rail on wall with max A +2 L knee block and A under hips after placing L UE on rail.  Patient assisted to room in w/c.  Sit to stand to sink for removing soiled brief and pants.  Stood again for A with perineal hygiene.  Stood third time to place clean brief all with +2 max A, but only able to stand about 10 seconds each trial.  Patient transferred to bed with +2 mod/max A squat pivot. Sit to supine +2 max A.  Positioned for comfort and bilat wrist restraints reapplied.  Left with soft touch call button in reach and bed alarm active telesitter in the room. Missed 10 minutes skilled PT due to fatigue.   Therapy Documentation Precautions:  Precautions Precautions: Fall, Other (comment) Precaution Comments: supplemental O2 via [place], L hemi, impaired safety awareness, impulsive, NPO Restrictions Weight Bearing Restrictions: No  Pain: Pain Assessment Pain Score: 0-No pain Faces Pain Scale: Hurts little more Pain Type: Acute pain Pain Location: Ankle Pain Orientation: Left Pain Descriptors  / Indicators: Sore Pain Onset: With Activity Pain Intervention(s): Repositioned;Rest     Therapy/Group: Individual Therapy  Elray Mcgregor [place], [place] 10/09/2020, 5:27 PM

## 2020-10-09 NOTE — Progress Notes (Signed)
Patient remains alert and oriented to person and place. Mostly restless all day. Medicated with xanax as ordered and Percocet for lower back pain.  Anxiety remains but pain subsided.  Went to therapy as scheduled. Family visited and attended therapy with patient .  Peg tube patent and intact. Flushes well. No aspiration noted. Tolerated all bolus feedings via peg.  Abdominal binder remains in place to keep patient from pulling peg tube. Soft retrains remains in use with intermittent release for ROM.

## 2020-10-09 NOTE — Progress Notes (Signed)
[place] PHYSICAL MEDICINE & REHABILITATION PROGRESS NOTE  Subjective/Complaints: Wife requests psych eval, I have placed order. Patient does not have capacity to make his own decisions.  Applying for SNFs  ROS: Limited due to cognition.  Objective: Vital Signs: Blood pressure 131/70, pulse 73, temperature 98 F (36.7 C), temperature source Oral, resp. rate 16, height 6' (1.829 m), weight 81.4 kg, SpO2 95 %. No results found. Recent Labs    10/07/20 0545  WBC 9.2  HGB 9.0*  HCT 27.3*  PLT 311   No results for input(s): NA, K, CL, CO2, GLUCOSE, BUN, CREATININE, CALCIUM in the last 72 hours.  No intake or output data in the 24 hours ending 10/09/20 1221        Physical Exam: BP 131/70 (BP Location: Right Arm)   Pulse 73   Temp 98 F (36.7 C) (Oral)   Resp 16   Ht 6' (1.829 m)   Wt 81.4 kg   SpO2 95%   BMI 24.34 kg/m  Gen: no distress, normal appearing HEENT: oral mucosa pink and moist, NCAT Cardio: Reg rate Chest: normal effort, normal rate of breathing Abd: soft, non-distended Ext: no edema Psych: pleasant, normal affect Skin: intact  Musc: No edema in extremities.  No tenderness in extremities. Neuro: Alert Dysarthria, unchanged LUE: Shoulder abduction, elbow flexion/extension 3/5, handgrip 3/5, improving LLE: 3-/5 proximal distal  Assessment/Plan: 1. Functional deficits which require 3+ hours per day of interdisciplinary therapy in a comprehensive inpatient rehab setting. Physiatrist is providing close team supervision and 24 hour management of active medical problems listed below. Physiatrist and rehab team continue to assess barriers to discharge/monitor patient progress toward functional and medical goals   Care Tool:  Bathing    Body parts bathed by patient: Chest, Abdomen, Right upper leg, Face   Body parts bathed by helper: Right arm, Left arm, Left upper leg, Right lower leg, Left lower leg Body parts n/a: Front perineal area, Buttocks    Bathing assist Assist Level: Maximal Assistance - Patient 24 - 49%     Upper Body Dressing/Undressing Upper body dressing   What is the patient wearing?: Pull over shirt    Upper body assist Assist Level: Maximal Assistance - Patient 25 - 49%    Lower Body Dressing/Undressing Lower body dressing      What is the patient wearing?: Pants, Incontinence brief     Lower body assist Assist for lower body dressing: Total Assistance - Patient < 25%     Toileting Toileting Toileting Activity did not occur Press photographer and hygiene only): N/A (no void or bm)  Toileting assist Assist for toileting: 2 Helpers     Transfers Chair/bed transfer  Transfers assist  Chair/bed transfer activity did not occur: Safety/medical concerns  Chair/bed transfer assist level: 2 Helpers     Locomotion Ambulation   Ambulation assist   Ambulation activity did not occur: Safety/medical concerns          Walk 10 feet activity   Assist  Walk 10 feet activity did not occur: Safety/medical concerns        Walk 50 feet activity   Assist Walk 50 feet with 2 turns activity did not occur: Safety/medical concerns         Walk 150 feet activity   Assist Walk 150 feet activity did not occur: Safety/medical concerns         Walk 10 feet on uneven surface  activity   Assist Walk 10 feet on uneven surfaces  activity did not occur: Safety/medical concerns         Wheelchair     Assist Is the patient using a wheelchair?: Yes Type of Wheelchair: Manual Wheelchair activity did not occur: Safety/medical concerns         Wheelchair 50 feet with 2 turns activity    Assist    Wheelchair 50 feet with 2 turns activity did not occur: Safety/medical concerns       Wheelchair 150 feet activity     Assist  Wheelchair 150 feet activity did not occur: Safety/medical concerns        Medical Problem List and Plan: 1.   Left side hemiparesis and  dysarthria secondary to 2 adjacent acute infarctions in the right cingulate gyrus/colossal body(right ACA vascular territory) as well as infarct right parietal lobe and left frontal subcortical white matter infarct. Hx of prior stroke 6/22.  Continue CIR  D/c to SNF when bed available.  2.  Impaired mobility: Discontinue Lovenox given bleeding from PEG site.              -antiplatelet therapy: Has completed 3 weeks of Plavix, continue Aspirin 81mg  daily.  3. Low back pain: d/c IV Dilaudid. Discontinue Norco. Add lidocaine patch for lower back.   Appears controlled on 8/24 4. Anxiety: Continue Xanax as needed. Increased seroquel to 25mg  HS. Psych consulted as per mother's request.  5. Neuropsych: This patient is not capable of making decisions on his own behalf.   Telemetry sitter for safety, continue  Wrist restraints for safety, continue 6. Skin/Wound Care: Routine skin checks 7. 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-Continue routine PEG care  -SLP follow up, continue to advance diet as tolerated 9. Cocaine/tobacco abuse.UDS positive cocaine.  Counseled on appropriate 10.  Diabetes mellitus.  Hemoglobin A1c 6.4.   Diabetic teaching when appropriate  Levemir decreased to 8 on 8/17  Discontinue Novolog- no more hypoglycemic episodes after this CBG (last 3)  Recent Labs    10/09/20 0428 10/09/20 0833 10/09/20 1139  GLUCAP 129* 100* 196*  Reduce levimir to 5U given am CBG of 85  11.  Hyperlipidemia.  Crestor/fenofibrate 12.Aspiration pneumonia.  Completed 14 days antibiotic 13.  Constipation.  MiraLAX 14.  Acute blood loss anemia  Hemoglobin 7.6 on 8/17, up to 7.8 on 8/18, plavix resumed as per GI recommendations. Continue PPI indefinitely. Repeat CBC periodically.   Endoscopy shows no ulcers 15.  Leukocytosis  CBC stable, remains afebrile  16.  AKI: Resolved  Creatinine  0.81 on 8/16  IVF initiated, IVF DC'd 17. Hypernatremia: Sodium 139 on 8/16, improved 18. HTN: started propanolol 10mg  daily.   Vitals:   10/08/20 2120 10/09/20 0430  BP: 111/73 131/70  Pulse: 79 73  Resp: 16 16  Temp: 98.1 F (36.7 C) 98 F (36.7 C)  SpO2: 92% 95%   Fair control 8/21 19. Suicidal ideation: chaplain consulted. Palliative care consulted  LOS: 12 days A FACE TO FACE EVALUATION WAS PERFORMED  10/11/20 Melvia Matousek 10/09/2020, 12:21 PM

## 2020-10-10 LAB — GLUCOSE, CAPILLARY
Glucose-Capillary: 128 mg/dL — ABNORMAL HIGH (ref 70–99)
Glucose-Capillary: 132 mg/dL — ABNORMAL HIGH (ref 70–99)
Glucose-Capillary: 164 mg/dL — ABNORMAL HIGH (ref 70–99)
Glucose-Capillary: 183 mg/dL — ABNORMAL HIGH (ref 70–99)
Glucose-Capillary: 219 mg/dL — ABNORMAL HIGH (ref 70–99)
Glucose-Capillary: 90 mg/dL (ref 70–99)

## 2020-10-10 NOTE — Progress Notes (Signed)
This chaplain responded to the Pt. spiritual care consult for major life transition.  The Pt. RN-[name] is at the bedside.  The chaplain learned in this visit the Pt. recognizes talking to someone has a calming effect on his emotions.  The chaplain engaged the Pt. in talking about his four grandchildren and their personalities. The chaplain understands the RN-[name] will update the night RN on the availability of a chaplain 24 hours per day.   The Pt. is expecting and looks forward to visitors. The Pt. aunt and niece will visit today. The Pt. was able to call his S/O-[name] on the phone with the chaplain's assistance.   The chaplain was in the Pt. room as the MD visited and updated the Pt. on the opportunity to remove the restraints during PT today at 2:30 and possibly one hour afterwards with the Pt. positive behavior. The Pt. repeated the arrangement back to the MD.  The chaplain will continue with F/U spiritual care as needed.

## 2020-10-10 NOTE — Progress Notes (Signed)
Physical Therapy Session Note  Patient Details  Name: Brent [name] MRN: [id number] Date of Birth: [birth year]  Today's Date: 10/10/2020 PT Individual Time: 1004-1100 PT Individual Time Calculation (min): 56 min   Short Term Goals: Week 2:  PT Short Term Goal 1 (Week 2): Pt will tolerate upright, OOB sitting for at least 1 hour between therapy sessions. PT Short Term Goal 2 (Week 2): Pt will demonstrate improved sitting balance with ability to maintain static sitting for at least 2 minutes without UE support PT Short Term Goal 3 (Week 2): Pt will perform chair <>bed transfers w/max A x1  Skilled Therapeutic Interventions/Progress Updates:    Patient in supine and eager to get OOB.  Performed supine to sit mod A using rail for trunk lifting and L LE initiation.  Performed squat pivot transfer to w/c with max A (+2 for safety).  Patient in tilt in space w/c assisted to dayroom.  Wrapped L ankle with ACE wrap for support due to c/o pain. Transfer to mat squat pivot mod/max A +2 for safety.  Patient performed seated balance and reaching activity for bean bags to throw to corn hole board with L side trunk facilitation for rib activation with reaching up and to L for L attention and improved midline balance.  Patient kept attention with classic rock music playing and mod cues for continuing to reach, though admitted to distractions between cues for attention to task.  Patient with LOB posterior on mat when placing Stedy under his feet max A to recover.  Patient sit to stand to [facility] with mod A +2 then standing from seat of Stedy performed with min A of 2 x 6 reps.  Patient assisted to w/c using Stedy.  Seated in w/c for [place] bilateral LE's at 80 cm/sec with mod continuous cues over 6 minutes.  Patient assisted in w/c to room and left seated with wrist restraints reapplied and alarm belt active talking on phone with his mom.  Telesitter in the room and staff in hallway.   Therapy  Documentation Precautions:  Precautions Precautions: Fall, Other (comment) Precaution Comments: supplemental O2 via [place], L hemi, impaired safety awareness, impulsive, NPO Restrictions Weight Bearing Restrictions: No  Pain: Pain Assessment Pain Scale: 0-10 Pain Score: 9  Faces Pain Scale: Hurts little more Pain Type: Acute pain Pain Location: Ankle Pain Orientation: Left Pain Descriptors / Indicators: Sore Pain Frequency: Constant Pain Onset: With Activity Pain Intervention(s): Other (Comment) (wrapped with ace wrap)    Therapy/Group: Individual Therapy  Brent [name], PT 10/10/2020, 8:52 AM

## 2020-10-10 NOTE — Progress Notes (Signed)
TeleSitter called. Pt has opened up abd binder. Went down to room and explained that can't open abd binder. Pt reported that he didn't and started crying.

## 2020-10-10 NOTE — Progress Notes (Signed)
Pts O2 sats 87% on RA. Pt was placed on 2L [place] and sats increased to 93%. RN aware.

## 2020-10-10 NOTE — Progress Notes (Signed)
Speech Language Pathology Weekly Progress and Session Note  Patient Details  Name: Brent [name] MRN: [id number] Date of Birth: 06-28-1965  Beginning of progress report period:  10/04/2020 End of progress report period:  10/10/2020  Today's Date: 10/10/2020 SLP Individual Time: 1350-1445 SLP Individual Time Calculation (min): 55 min  Short Term Goals: Week 2: SLP Short Term Goal 1 (Week 2): Patient will use visual aids to orient to time/place/situation with modA cues. SLP Short Term Goal 1 - Progress (Week 2): Partly met SLP Short Term Goal 2 (Week 2): Patient will demonstrate awareness to errors during basic level functional tasks with modA cues. SLP Short Term Goal 2 - Progress (Week 2): Not met SLP Short Term Goal 3 (Week 2): Patient will demonstrate sustained attention during functional tasks for increments of 2-3 minutes with modA cues. SLP Short Term Goal 3 - Progress (Week 2): Not met SLP Short Term Goal 4 (Week 2): Patient will tolerate trials of smooth thick purees/pudding thick liquids with SLP only with minimal overt s/s aspiration/penetration. SLP Short Term Goal 4 - Progress (Week 2): Not met    New Short Term Goals: Week 3: SLP Short Term Goal 1 (Week 3): Patient will tolerate trials of smooth thick purees/pudding thick liquids with SLP only with minimal overt s/s aspiration/penetration. SLP Short Term Goal 2 (Week 3): Patient will attend to basic level functional task with modA cues for duration of 2-3 minutes. SLP Short Term Goal 3 (Week 3): Patient will follow commands to perform basic level functional tasks with modA cues. SLP Short Term Goal 4 (Week 3): Patient will use visual aids to orient to time/place/situation with minA cues.  Weekly Progress Updates:  Patient did not meet any STG's due to ongoing difficulties with patient's participation, attention and overall awareness. In addition, his swallow function has not significantly changed when comparing MBS  during current rehab stay to [facility] completed previous month.    Intensity: Minumum of 1-2 x/day, 30 to 90 minutes Frequency: 3 to 5 out of 7 days Duration/Length of Stay: 9/9 Treatment/Interventions: Cognitive remediation/compensation;Dysphagia/aspiration precaution training;Internal/external aids;Speech/Language facilitation;Therapeutic Activities;Cueing hierarchy;Patient/family education;Functional tasks;Therapeutic Exercise   Daily Session  Skilled Therapeutic Interventions: Patient seen for skilled ST session focusing on dysphagia and cognitive function goals. Patient required mod-maxA verbal cues to redirect to perform functional task during attempts to reposition in bed, however he was fixated on "sitting up at the edge of the bed". He did demonstrate some improvement in overall recall and awareness, telling SLP that a chaplain had been visiting and that he was told his restraints were to be taken off. (Both true). When SLP was able to get patient positioned adequately for PO trials, he consumed 5 spoon sips of honey thick liquids without overt s/s aspiration or penetration and without changes in voice. Patient continues to benefit from skilled SLP intervention to maximize cognitive-linguistic and swallow function goals prior to discharge.     General    Pain Pain Assessment Pain Scale: 0-10 Pain Score: 9  Faces Pain Scale: No hurt Pain Type: Chronic pain Pain Location: Back Pain Orientation: Lower Pain Descriptors / Indicators: Aching Pain Frequency: Constant Pain Onset: On-going Pain Intervention(s): Medication (See eMAR)  Therapy/Group: Individual Therapy  Sonia Baller, MA, CCC-SLP Speech Therapy

## 2020-10-10 NOTE — Progress Notes (Signed)
Removed restraints during last therapy session today and an hour afterwards. Family at bedside. Pt thought he didn't have to have them anymore. Explained to pt that he does but because he did well without them for approx 2 hours that will consider asking that they be discontinued. Informed that has to do well tonight as well. Will discuss with provider in am and night shift nurse in am.

## 2020-10-10 NOTE — Progress Notes (Signed)
Removed restraints while pt with therapy and one hour afterwards. Will reapply restraints at 1600.

## 2020-10-10 NOTE — Progress Notes (Signed)
PHYSICAL MEDICINE & REHABILITATION PROGRESS NOTE  Subjective/Complaints: Patient is tearful about restraints, requests that they be removed. Discussed that I will ask for them to be removed for one hour during Brent [name]'s session and 1 more hour after that if he does well without trying to pull PEG  ROS: Limited due to cognition.  Objective: Vital Signs: Blood pressure 116/78, pulse 73, temperature 97.6 F (36.4 C), temperature source Oral, resp. rate 16, height 6' (1.829 m), weight 81.4 kg, SpO2 93 %. No results found. No results for input(s): WBC, HGB, HCT, PLT in the last 72 hours.  No results for input(s): NA, K, CL, CO2, GLUCOSE, BUN, CREATININE, CALCIUM in the last 72 hours.   Intake/Output Summary (Last 24 hours) at 10/10/2020 1331 Last data filed at 10/10/2020 0800 Gross per 24 hour  Intake 0 ml  Output --  Net 0 ml          Physical Exam: BP 116/78 (BP Location: Right Arm)   Pulse 73   Temp 97.6 F (36.4 C) (Oral)   Resp 16   Ht 6' (1.829 m)   Wt 81.4 kg   SpO2 93%   BMI 24.34 kg/m  Gen: no distress, normal appearing HEENT: oral mucosa pink and moist, NCAT Cardio: Reg rate Chest: normal effort, normal rate of breathing Abd: soft, non-distended Ext: b/l wrist restrains in place Psych: pleasant, normal affect Skin: intact  Musc: No edema in extremities.  No tenderness in extremities. Neuro: Alert Dysarthria, unchanged LUE: Shoulder abduction, elbow flexion/extension 3/5, handgrip 3/5, improving LLE: 3-/5 proximal distal  Assessment/Plan: 1. Functional deficits which require 3+ hours per day of interdisciplinary therapy in a comprehensive inpatient rehab setting. Physiatrist is providing close team supervision and 24 hour management of active medical problems listed below. Physiatrist and rehab team continue to assess barriers to discharge/monitor patient progress toward functional and medical goals   Care Tool:  Bathing    Body parts bathed  by patient: Abdomen, Chest, Face, Right arm, Left arm   Body parts bathed by helper:  (pt declined bathing all other body parts) Body parts n/a: Front perineal area, Buttocks   Bathing assist Assist Level: Moderate Assistance - Patient 50 - 74%     Upper Body Dressing/Undressing Upper body dressing   What is the patient wearing?: Pull over shirt    Upper body assist Assist Level: Moderate Assistance - Patient 50 - 74%    Lower Body Dressing/Undressing Lower body dressing      What is the patient wearing?: Pants, Underwear/pull up     Lower body assist Assist for lower body dressing: Total Assistance - Patient < 25%     Toileting Toileting Toileting Activity did not occur Press photographer and hygiene only): N/A (no void or bm)  Toileting assist Assist for toileting: 2 Helpers     Transfers Chair/bed transfer  Transfers assist  Chair/bed transfer activity did not occur: Safety/medical concerns  Chair/bed transfer assist level: 2 Helpers     Locomotion Ambulation   Ambulation assist   Ambulation activity did not occur: Safety/medical concerns          Walk 10 feet activity   Assist  Walk 10 feet activity did not occur: Safety/medical concerns        Walk 50 feet activity   Assist Walk 50 feet with 2 turns activity did not occur: Safety/medical concerns         Walk 150 feet activity   Assist Walk 150 feet  activity did not occur: Safety/medical concerns         Walk 10 feet on uneven surface  activity   Assist Walk 10 feet on uneven surfaces activity did not occur: Safety/medical concerns         Wheelchair     Assist Is the patient using a wheelchair?: Yes Type of Wheelchair: Manual Wheelchair activity did not occur: Safety/medical concerns         Wheelchair 50 feet with 2 turns activity    Assist    Wheelchair 50 feet with 2 turns activity did not occur: Safety/medical concerns       Wheelchair 150  feet activity     Assist  Wheelchair 150 feet activity did not occur: Safety/medical concerns        Medical Problem List and Plan: 1.   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 Hx of prior stroke 6/22.  Continue CIR  D/c to SNF when bed available.  2.  Impaired mobility: Discontinue Lovenox given bleeding from PEG site.              -antiplatelet therapy: Has completed 3 weeks of Plavix, continue Aspirin 81mg  daily.  3. Low back pain: d/c IV Dilaudid. Discontinue Norco. Add lidocaine patch for lower back.   Appears controlled on 8/24 4. Anxiety: Continue Xanax as needed. Increased seroquel to 25mg  HS. Psych consulted as per mother's request. Can consult chaplain at night as well if patient is agitated- they have 247 service 5. Neuropsych: This patient is not capable of making decisions on his own behalf.   Telemetry sitter for safety, continue  Wrist restraints for safety, continue  Trial off wrist restraints one hour today, extend if not trying to pull PEG 6. Skin/Wound Care: Routine skin checks 7. 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-Continue routine PEG care  -SLP follow up, continue to advance diet as tolerated 9. Cocaine/tobacco abuse.UDS positive cocaine.  Counseled on appropriate 10.  Diabetes mellitus.  Hemoglobin A1c 6.4.   Diabetic teaching when appropriate  Levemir decreased to 8 on 8/17  Discontinue Novolog- no more hypoglycemic episodes after this CBG (last 3)  Recent Labs    10/10/20 0409 10/10/20 0810 10/10/20 1118  GLUCAP 90 128* 219*  Reduce levimir to 5U given am CBG of 85  11.  Hyperlipidemia.  Crestor/fenofibrate 12.Aspiration pneumonia.   Completed 14 days antibiotic 13.  Constipation.  MiraLAX 14.  Acute blood loss anemia  Hemoglobin 7.6 on 8/17, up to 7.8 on 8/18, plavix resumed as per GI recommendations. Continue PPI indefinitely. Repeat CBC periodically.   Endoscopy shows no ulcers 15.  Leukocytosis  CBC stable, remains afebrile  16.  AKI: Resolved  Creatinine 0.81 on 8/16  IVF initiated, IVF DC'd 17. Hypernatremia: Sodium 139 on 8/16, improved 18. HTN: started propanolol 10mg  daily.   Vitals:   10/10/20 0757 10/10/20 0809  BP:    Pulse:  73  Resp:  16  Temp:    SpO2: (!) 87% 93%   Fair control 8/21 19. Suicidal ideation: chaplain consulted. Palliative care consulted  LOS: 13 days A FACE TO FACE EVALUATION WAS PERFORMED  10/12/20 P Syenna Nazir 10/10/2020, 1:31 PM

## 2020-10-10 NOTE — Progress Notes (Signed)
Occupational Therapy Session Note  Patient Details  Name: Brent [name] MRN: [id number] Date of Birth: Sep 17, 1965  Today's Date: 10/10/2020 OT Individual Time: 9678-9381 OT Individual Time Calculation (min): 60 min    Short Term Goals: Week 2:  OT Short Term Goal 1 (Week 2): Pt will maintain static sitting balance EOB with supervision for 4 mins in preparation for selfcare tasks. OT Short Term Goal 2 (Week 2): Pt will complete UB bathing with min assist for two consecutive sessions. OT Short Term Goal 3 (Week 2): Pt will demonstrate selective attention in minimally distracting environment x 5 min following overt verbal cue in familiar task. OT Short Term Goal 4 (Week 2): Patient will stand with mod assist in preparation for LB dressing.  Skilled Therapeutic Interventions/Progress Updates:  Pt greeted supine in bed with RN present providing tube feedings. Pt continues to be hyper verbose adamantly  requesting to get OOB. Pt noted be incontinent of bowels with pt needing MAX A +2 for posterior pericare via rolling R<>L. CGA ro roll to the L with pt needing MIN A to roll to the R. Pt transitioning supine>sitting with MOD A +2, needing step by step cues to sequence tasks for bed mobility. Pt required MOD A for UB dressing and total A for LB dressing from EOB, max cues to recall hemi dressing techniques from previous sessions. Pt completed sit<>stand from EOB to pull pants up to waist line with MOD A +2. Pt completed squat pivot transfer from EOB >w/c going towards pts L side with MAX A +2. Pt transported to day room with total A. Pt completed additional squat pivot transfer from w/c>EOM with MOD A +2. Once on the mat pt reports needing to void bowels. Pt returned to w/c in same fashion as previously indicated, total A to return to room from w/c level. Pt returned to supine to transfer onto bed pan. MIN A +2 to roll onto bed pan for toileting, MAX A +2 for posterior pericare and to change new brief.  Pt left supine in bed with bed alarm activated and wrist restraints applied.   Therapy Documentation Precautions:  Precautions Precautions: Fall, Other (comment) Precaution Comments: supplemental O2 via [place], L hemi, impaired safety awareness, impulsive, NPO Restrictions Weight Bearing Restrictions: No  Pain: Pt reports pain in back; RN provided pain meds during session.    Therapy/Group: Individual Therapy  Pollyann Glen [facility] 10/10/2020, 10:39 AM

## 2020-10-11 LAB — GLUCOSE, CAPILLARY
Glucose-Capillary: 100 mg/dL — ABNORMAL HIGH (ref 70–99)
Glucose-Capillary: 116 mg/dL — ABNORMAL HIGH (ref 70–99)
Glucose-Capillary: 130 mg/dL — ABNORMAL HIGH (ref 70–99)
Glucose-Capillary: 142 mg/dL — ABNORMAL HIGH (ref 70–99)
Glucose-Capillary: 146 mg/dL — ABNORMAL HIGH (ref 70–99)
Glucose-Capillary: 156 mg/dL — ABNORMAL HIGH (ref 70–99)
Glucose-Capillary: 238 mg/dL — ABNORMAL HIGH (ref 70–99)

## 2020-10-11 MED ORDER — INSULIN DETEMIR 100 UNIT/ML ~~LOC~~ SOLN
6.0000 [IU] | Freq: Two times a day (BID) | SUBCUTANEOUS | Status: DC
  Administered 2020-10-11 – 2020-10-18 (×14): 6 [IU] via SUBCUTANEOUS
  Filled 2020-10-11 (×17): qty 0.06

## 2020-10-11 MED ORDER — ESCITALOPRAM OXALATE 10 MG PO TABS
5.0000 mg | ORAL_TABLET | Freq: Every day | ORAL | Status: DC
  Administered 2020-10-11 – 2020-10-15 (×5): 5 mg
  Filled 2020-10-11 (×5): qty 1

## 2020-10-11 MED ORDER — PROPRANOLOL HCL 10 MG PO TABS
10.0000 mg | ORAL_TABLET | Freq: Every day | ORAL | Status: DC
  Administered 2020-10-12 – 2020-10-17 (×6): 10 mg
  Filled 2020-10-11 (×6): qty 1

## 2020-10-11 NOTE — Progress Notes (Signed)
Patient ID: Brent [name], male   DOB: 12/10/1965, 55 y.o.   MRN: [id number] Sent out FL2 since pt is doing well without restraints-still has telesitter which will need to be discharged also. Will see if any bed offers, pt will be difficult to place due to medicaid and drug history.

## 2020-10-11 NOTE — Progress Notes (Signed)
   Palliative Medicine Inpatient Follow Up Note  Consulting Provider: Izora Ribas, MD   Reason for consult:   [name] Palliative Medicine Consult  Reason for Consult? patient expresses that he wants to die    HPI:  Per intake H&P --> 55 year old male with past medical history of of COPD, diabetes mellitus type 2, hyperlipidemia, hypertension, obstructive sleep apnea, gastroesophageal reflux disease, cocaine abuse with recent hospitalization for acute stroke now currently a patient in the inpatient rehabilitation unit.   Brent [name] was seen by the Palliative care team 7/28 - 8/5 as in inpatient. His goals at that time were clear for full scope of treatment and to ideally improve.    Palliative care has been asked to re-consult as patient has made some passive statements about "wanting to die."  Today's Discussion (10/11/2020):  *Please note that this is a verbal dictation therefore any spelling or grammatical errors are due to the "[place]" system interpretation.  Chart reviewed.   I met with Brent [name] this afternoon, he appears less agitated. He shares with me that he wants his abdominal binder to be made tighter. He is perseverant on not "messing it up." He goes on to share that if he pulls his gastrostomy tube out he is going to be in "big trouble". I asked him what he meant by these comments. He stated that he was told by the nursing staff not to fuss with his tube. We reviewed what could happen if he self discontinues his gastrostomy tube.   Brent [name] is enthusiastic as the thought of his son and daughter in law coming by later. He states that he is looking forward to seeing them as he is feeling well this late afternoon.  Brent [name] then shares the plan to transition to the [facility] after this hospital stay.  Questions and concerns addressed   Objective Assessment: Vital Signs Vitals:   10/11/20 0806 10/11/20 1333  BP:  134/75  Pulse:  76   Resp:  16  Temp:  97.9 F (36.6 C)  SpO2: 92% 98%    Intake/Output Summary (Last 24 hours) at 10/11/2020 1616 Last data filed at 10/10/2020 1836 Gross per 24 hour  Intake 0 ml  Output --  Net 0 ml   Last Weight  Most recent update: 10/01/2020  4:17 AM    Weight  81.4 kg (179 lb 7.3 oz)            Gen:  Middle aged caucasian M in NAD HEENT: moist mucous membranes CV: Regular rate and rhythm  PULM: On 2LPM [place] ABD: soft/nontender  EXT: No edema  Neuro: Alert and oriented x2  SUMMARY OF RECOMMENDATIONS   Full Code / Full Scope of Care   Plan to meet at [facility] on Sunday with patients mother, significant other, and two children   Anxiety management per primary team   Ongoing support by PMT team  Time Spent: 25 Greater than 50% of the time was spent in counseling and coordination of care ______________________________________________________________________________________ [place] Team Team Cell Phone: [phone] Please utilize secure chat with additional questions, if there is no response within 30 minutes please call the above phone number  Palliative Medicine Team providers are available by phone from 7am to 7pm daily and can be reached through the team cell phone.  Should this patient require assistance outside of these hours, please call the patient's attending physician.

## 2020-10-11 NOTE — Progress Notes (Signed)
   10/11/20 1015  Clinical Encounter Type  Visited With Patient  Visit Type Follow-up;Social support  Referral From Nurse  Consult/Referral To [employer] responded. Chaplain waited for the patient to finish with his physical therapist. The chaplain actively listened as the patient spoke of not knowing how long he would be in the hospital. He is concerned he cannot keep up with his male friend. He said they lived together, and his mother lives next door. He is hoping she will visit today at 3 pm. The patient said he believes he has a good support system, including his son and niece, who recently visited. He hopes he will be able to return home but will be ok if he goes to the nursing home in his community. Chaplain offered prayer. This note was prepared by Deneen Harts, M.Div..  For questions please contact by phone [phone].

## 2020-10-11 NOTE — Progress Notes (Signed)
Speech Language Pathology Daily Session Note  Patient Details  Name: Brent [name] MRN: [id number] Date of Birth: 12/01/1965  Today's Date: 10/11/2020 SLP Individual Time: 1300-1400 SLP Individual Time Calculation (min): 60 min  Short Term Goals: Week 3: SLP Short Term Goal 1 (Week 3): Patient will tolerate trials of smooth thick purees/pudding thick liquids with SLP only with minimal overt s/s aspiration/penetration. SLP Short Term Goal 2 (Week 3): Patient will attend to basic level functional task with modA cues for duration of 2-3 minutes. SLP Short Term Goal 3 (Week 3): Patient will follow commands to perform basic level functional tasks with modA cues. SLP Short Term Goal 4 (Week 3): Patient will use visual aids to orient to time/place/situation with minA cues.  Skilled Therapeutic Interventions: Pt seen for skilled ST with focus on cognitive and swallowing goals. Pt in bed, soft wrist restraints not present, moments of emotional lability throughout. Pt demonstrating improving insight but still remains impaired, aware of rationale behind soft wrist restraints and not needing them now. Pt states "I'm never going to pull anything again, it hurts, I'm not that stupid". Pt repeating to staff and family via telephone he will have chicken nuggets tomorrow and trials of pudding today will "let him go home earlier that everyone thinks". Provided extensive education regarding limited PO trials with SLP only and current physical and cognitive deficits. Pt verbalizes understanding, is OK with continuing rehab in SNF environment at d/c. Pt consuming ~3 oz chocolate pudding with 1 large coughing episode, 5/5 tsp sips of HTL with immediate overt s/s aspiration so HTL trials halted at this time. Pt left in bed with alarm set and all needs within reach, encouraged to utilize call button for all assist as needed. Cont ST POC.   Pain Pain Assessment Pain Scale: 0-10 Pain Score: 0-No  pain  Therapy/Group: Individual Therapy  Tacey Ruiz 10/11/2020, 1:43 PM

## 2020-10-11 NOTE — Progress Notes (Signed)
Pt did well yesterday with restraints off for a little while. Today he has done very well with restraints being off for a bit this morning,  he has been appropriate with call bell use, and there have been no attempts to pull on the PEG tube.  He has been tearful when talking about his family.  Updated Dr Dalene Carrow on progress with restraints, new orders received and bilateral wrist restraints discontinued. Partner Clydie Braun will be in at 1500 and will update on restraints being discontinued at that time.

## 2020-10-11 NOTE — Progress Notes (Signed)
   10/11/20 0915  Clinical Encounter Type  Visited With Patient not available  Visit Type Initial  Referral From Nurse  Consult/Referral To Chaplain   Chaplain responded to the consult request. The patient was being attended to by the medical team. Chaplain will follow-up. This note was prepared by Deneen Harts, M.Div..  For questions please contact by phone [phone].

## 2020-10-11 NOTE — Progress Notes (Signed)
[place] PHYSICAL MEDICINE & REHABILITATION PROGRESS NOTE  Subjective/Complaints: Appreciate chaplain support from which patient greatly benefits Patient has been doing well off restraints- no more pulling of PEG  ROS: Limited due to cognition.  Objective: Vital Signs: Blood pressure 136/77, pulse 78, temperature 98.6 F (37 C), temperature source Oral, resp. rate 18, height 6' (1.829 m), weight 81.4 kg, SpO2 92 %. No results found. No results for input(s): WBC, HGB, HCT, PLT in the last 72 hours.  No results for input(s): NA, K, CL, CO2, GLUCOSE, BUN, CREATININE, CALCIUM in the last 72 hours.   Intake/Output Summary (Last 24 hours) at 10/11/2020 1245 Last data filed at 10/10/2020 1836 Gross per 24 hour  Intake 0 ml  Output --  Net 0 ml          Physical Exam: BP 136/77 (BP Location: Right Arm)   Pulse 78   Temp 98.6 F (37 C) (Oral)   Resp 18   Ht 6' (1.829 m)   Wt 81.4 kg   SpO2 92%   BMI 24.34 kg/m  Gen: no distress, normal appearing HEENT: oral mucosa pink and moist, NCAT Cardio: Reg rate Chest: normal effort, normal rate of breathing Abd: soft, non-distended Ext: wrist restraints off Psych: pleasant, normal affect Skin: intact  Musc: No edema in extremities.  No tenderness in extremities. Neuro: Alert Dysarthria, unchanged LUE: Shoulder abduction, elbow flexion/extension 3/5, handgrip 3/5, improving LLE: 3-/5 proximal distal  Assessment/Plan: 1. Functional deficits which require 3+ hours per day of interdisciplinary therapy in a comprehensive inpatient rehab setting. Physiatrist is providing close team supervision and 24 hour management of active medical problems listed below. Physiatrist and rehab team continue to assess barriers to discharge/monitor patient progress toward functional and medical goals   Care Tool:  Bathing    Body parts bathed by patient: Abdomen, Chest, Face, Right arm, Left arm   Body parts bathed by helper:  (pt declined  bathing all other body parts) Body parts n/a: Front perineal area, Buttocks   Bathing assist Assist Level: Moderate Assistance - Patient 50 - 74%     Upper Body Dressing/Undressing Upper body dressing   What is the patient wearing?: Pull over shirt    Upper body assist Assist Level: Moderate Assistance - Patient 50 - 74%    Lower Body Dressing/Undressing Lower body dressing      What is the patient wearing?: Pants, Underwear/pull up     Lower body assist Assist for lower body dressing: Maximal Assistance - Patient 25 - 49%     Toileting Toileting Toileting Activity did not occur (Clothing management and hygiene only): N/A (no void or bm)  Toileting assist Assist for toileting: 2 Helpers     Transfers Chair/bed transfer  Transfers assist  Chair/bed transfer activity did not occur: Safety/medical concerns  Chair/bed transfer assist level: Maximal Assistance - Patient 25 - 49%     Locomotion Ambulation   Ambulation assist   Ambulation activity did not occur: Safety/medical concerns          Walk 10 feet activity   Assist  Walk 10 feet activity did not occur: Safety/medical concerns        Walk 50 feet activity   Assist Walk 50 feet with 2 turns activity did not occur: Safety/medical concerns         Walk 150 feet activity   Assist Walk 150 feet activity did not occur: Safety/medical concerns         Walk 10 feet  on uneven surface  activity   Assist Walk 10 feet on uneven surfaces activity did not occur: Safety/medical concerns         Wheelchair     Assist Is the patient using a wheelchair?: Yes Type of Wheelchair: Manual Wheelchair activity did not occur: Safety/medical concerns         Wheelchair 50 feet with 2 turns activity    Assist    Wheelchair 50 feet with 2 turns activity did not occur: Safety/medical concerns       Wheelchair 150 feet activity     Assist  Wheelchair 150 feet activity did not  occur: Safety/medical concerns        Medical Problem List and Plan: 1.   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 Hx of prior stroke 6/22.  Continue CIR  D/c to SNF when bed available.  2.  Impaired mobility: Discontinue Lovenox given bleeding from PEG site.              -antiplatelet therapy: Has completed 3 weeks of Plavix, continue Aspirin 81mg  daily.  3. Low back pain: d/c IV Dilaudid. Discontinue Norco. Add lidocaine patch for lower back.   Appears controlled on 8/26 4. Anxiety: Continue Xanax as needed. Increased seroquel to 25mg  HS. Psych consulted as per mother's request. Can consult chaplain at night as well if patient is agitated- they have 247 service 5. Neuropsych: This patient is not capable of making decisions on his own behalf.   Telemetry sitter for safety, continue  Continue trial off wrist restraints.  6. Skin/Wound Care: Routine skin checks 7. 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-Continue routine PEG care  -SLP follow up, continue to advance diet as tolerated 9. Cocaine/tobacco abuse.UDS positive cocaine.  Counseled on appropriate 10.  Diabetes mellitus.  Hemoglobin A1c 6.4.   Diabetic teaching when appropriate  Levemir decreased to 8 on 8/17  Discontinue Novolog- no more hypoglycemic episodes after this CBG (last 3)  Recent Labs    10/11/20 0406 10/11/20 0755 10/11/20 1144  GLUCAP 100* 116* 238*  Increase Levemir to 6U  11.  Hyperlipidemia.  Crestor/fenofibrate 12.Aspiration pneumonia.  Completed 14 days antibiotic 13.  Constipation.  MiraLAX 14.  Acute blood loss anemia  Hemoglobin 7.6 on 8/17, up to 7.8 on 8/18, plavix resumed as per GI  recommendations. Continue PPI indefinitely. Repeat CBC periodically.   Endoscopy shows no ulcers 15.  Leukocytosis  CBC stable, remains afebrile  16.  AKI: Resolved  Creatinine 0.81 on 8/16  IVF initiated, IVF DC'd 17. Hypernatremia: Sodium 139 on 8/16, improved 18. HTN: started propanolol 10mg  daily.   Vitals:   10/11/20 0440 10/11/20 0806  BP: 136/77   Pulse: 78   Resp: 18   Temp: 98.6 F (37 C)   SpO2:  92%   Fair control 8/21 19. Suicidal ideation: chaplain consulted. Palliative care consulted  LOS: 14 days A FACE TO FACE EVALUATION WAS PERFORMED  10/13/20 Brent [name] 10/11/2020, 12:45 PM

## 2020-10-11 NOTE — Progress Notes (Signed)
Physical Therapy Session Note  Patient Details  Name: Brent [name] MRN: [id number] Date of Birth: Jun 11, 1965  Today's Date: 10/11/2020 PT Individual Time:  -      Short Term Goals: Week 2:  PT Short Term Goal 1 (Week 2): Pt will tolerate upright, OOB sitting for at least 1 hour between therapy sessions. PT Short Term Goal 2 (Week 2): Pt will demonstrate improved sitting balance with ability to maintain static sitting for at least 2 minutes without UE support PT Short Term Goal 3 (Week 2): Pt will perform chair <>bed transfers w/max A x1  Skilled Therapeutic Interventions/Progress Updates:  Pt presents supine in bed and agreeable to therapy.  Pt noted to have soiled brief when attempting to transfer.  Pt performed max A for sidelying to sit at EOB.  Pt sat EOB for Stedy to be positioned for sit to stand w/ max A.  Pt stood in bouts of 30 seconds to doff brief, perform Pericare and then don clean brief and pull up pants in [place].  Pt only requires min A for sit to stand from perch of Stedy, but tends to push to Right. NT present for assist w/ pericare.  Pt wheeled to main gym for time conservation.  Pt performed reaching forward pushing weighted ball on table, hand over hand for LUE.  Pt also performed reaching forward and cross midline grasping cones w/ L hand and manual A for lifting and stacking from PT.  Pt returned to room and performed sit to stand at [facility] w/ max A and then transferred to bed.  Pt able to stand from [employer] w/ min A.  Pt required mod to max A for sit to R sidelying along with constant verbal cues from PT.  Pt able to pull self to [facility] using headrail and Trendelenburg bed position, manual A to position LLE in hooklying position.  Bed alarm on and all needs in reach.  PT assisted pt by dialing partners number.     Therapy Documentation Precautions:  Precautions Precautions: Fall, Other (comment) Precaution Comments: supplemental O2 via [place], L hemi, impaired safety  awareness, impulsive, NPO Restrictions Weight Bearing Restrictions: No General:   Vital Signs: Therapy Vitals Temp: 97.9 F (36.6 C) Temp Source: Oral Pulse Rate: 76 Resp: 16 BP: 134/75 Patient Position (if appropriate): Sitting Oxygen Therapy SpO2: 98 % O2 Device: Nasal Cannula Pain:0/10 Pain Assessment Pain Scale: 0-10 Pain Score: 0-No pain    Therapy/Group: Individual Therapy  Lucio Edward 10/11/2020, 2:56 PM

## 2020-10-11 NOTE — Progress Notes (Signed)
Occupational Therapy Session Note  Patient Details  Name: Brent [name] MRN: [id number] Date of Birth: February 18, 1965  Today's Date: 10/11/2020 OT Individual Time: 1030-1100 OT Individual Time Calculation (min): 30 min    Short Term Goals: Week 1:  OT Short Term Goal 1 (Week 1): Pt will maintain sustained attention to basic selfcare/grooming tasks with no more than mod instructional cueing for re-direction. OT Short Term Goal 1 - Progress (Week 1): Met OT Short Term Goal 2 (Week 1): Pt will maintain static sitting balance EOB with supervision for 4 mins in preparation for selfcare tasks. OT Short Term Goal 2 - Progress (Week 1): Not met OT Short Term Goal 3 (Week 1): Pt will complete UB bathing with min assist for two consecutive sessions. OT Short Term Goal 3 - Progress (Week 1): Not met OT Short Term Goal 4 (Week 1): Pt will complete sit to stand with total +2 (pt 30%) during LB bathing or dressing. OT Short Term Goal 4 - Progress (Week 1): Met  Skilled Therapeutic Interventions/Progress Updates:     Pt received in [place] at RN station with no pain reported. ADL:  Pt reporting at end of session needing to toilet. D/t time constraints and sitting balance deficts elected to transfer back to bed with NT and pt to void on bed pan as NT unable at this time to fully supervise toileting. Pt requires MOD-MAX +2 in stedy to transfer to bed and MOD A to roll to place bed pan  Therapeutic activity Pt requesting to play tic tac to. Pt set up at [place] with expo marker and plays 4 games with lateral leaning into LUE for deep WB Into shoulder and elbow for NMR requiring pt to scan L to locate game. Pt with frequent switching of which letter he was supposed to play with no recollection.   Pt left at end of session in bed with exit alarm on, call light in reach and all needs met   Therapy Documentation Precautions:  Precautions Precautions: Fall, Other (comment) Precaution Comments: supplemental  O2 via [place], L hemi, impaired safety awareness, impulsive, NPO Restrictions Weight Bearing Restrictions: No    Therapy/Group: Individual Therapy  Tonny Branch 10/11/2020, 6:51 AM

## 2020-10-11 NOTE — Progress Notes (Signed)
Occupational Therapy Session Note  Patient Details  Name: Brent [name] MRN: [id number] Date of Birth: Jul 07, 1965  Today's Date: 10/11/2020 OT Individual Time: 0903-1005 OT Individual Time Calculation (min): 62 min    Short Term Goals: Week 1:  OT Short Term Goal 1 (Week 1): Pt will maintain sustained attention to basic selfcare/grooming tasks with no more than mod instructional cueing for re-direction. OT Short Term Goal 1 - Progress (Week 1): Met OT Short Term Goal 2 (Week 1): Pt will maintain static sitting balance EOB with supervision for 4 mins in preparation for selfcare tasks. OT Short Term Goal 2 - Progress (Week 1): Not met OT Short Term Goal 3 (Week 1): Pt will complete UB bathing with min assist for two consecutive sessions. OT Short Term Goal 3 - Progress (Week 1): Not met OT Short Term Goal 4 (Week 1): Pt will complete sit to stand with total +2 (pt 30%) during LB bathing or dressing. OT Short Term Goal 4 - Progress (Week 1): Met Week 2:  OT Short Term Goal 1 (Week 2): Pt will maintain static sitting balance EOB with supervision for 4 mins in preparation for selfcare tasks. OT Short Term Goal 2 (Week 2): Pt will complete UB bathing with min assist for two consecutive sessions. OT Short Term Goal 3 (Week 2): Pt will demonstrate selective attention in minimally distracting environment x 5 min following overt verbal cue in familiar task. OT Short Term Goal 4 (Week 2): Patient will stand with mod assist in preparation for LB dressing.  Skilled Therapeutic Interventions/Progress Updates:   Patient seen for OT session this am.  Patient received in bed with bilateral wrist restraints.  Patient wide awake and eager to get out of bed.  Patient asking to have restraints removed, stating"  I won't do nothing stupid."  Restraints removed.  Patient assisted to sitting.  Patient needed cueing and facilitation to include left side of body to roll to left, patient tends to leave left side  behind.   Seated on edge of bed with emphasis on dynamic sitting balance.  Patient with loss of balance consistently back and to right side - with facilitation able to maintain balance in midline.  Need frequent cueing for forward weight shift.  Donning slipper socks with max assist.  Patient spontaneously using left hand occasionally, or attempting to grasp, reach with left during familiar functional task.   Transferred to wheelchair with max assist - squat pivot - multi-squat.  Emphasis on forward midline weight shift and allowing LE 's to activate to lift off surface.  Transferred to gym to continue to address sitting balance and sit to stand transitions.  Patient able to shift forward and come to partial stand x 2 with mod assist.  Rehab tech available, but did not provide physical assistance.  Returned to nursing station with waist belt in place.      Therapy Documentation Precautions:  Precautions Precautions: Fall, Other (comment) Precaution Comments: supplemental O2 via [place], L hemi, impaired safety awareness, impulsive, NPO Restrictions Weight Bearing Restrictions: No    Vital Signs: Oxygen Therapy SpO2: 92 % O2 Device: Nasal Cannula O2 Flow Rate (L/min): 2 L/min Pain:  No report of pain  Therapy/Group: Individual Therapy  Mariah Milling 10/11/2020, 10:31 AM

## 2020-10-12 LAB — GLUCOSE, CAPILLARY
Glucose-Capillary: 97 mg/dL (ref 70–99)
Glucose-Capillary: 101 mg/dL — ABNORMAL HIGH (ref 70–99)
Glucose-Capillary: 305 mg/dL — ABNORMAL HIGH (ref 70–99)
Glucose-Capillary: 82 mg/dL (ref 70–99)
Glucose-Capillary: 145 mg/dL — ABNORMAL HIGH (ref 70–99)

## 2020-10-12 NOTE — Progress Notes (Signed)
Spouse arrived on unit. Updated on restraint status. Restraints removed while spouse in room. Charge Nurse/staff aware

## 2020-10-12 NOTE — Progress Notes (Signed)
Physical Therapy Session Note  Patient Details  Name: ABDULAHAD MEDEROS MRN: [id number] Date of Birth: Sep 27, 1965  Today's Date: 10/12/2020 PT Individual Time: 1335-1400 PT Individual Time Calculation (min): 25 min   Short Term Goals: Week 1:  PT Short Term Goal 1 (Week 1): Pt will tolerate upright, OOB sitting for at least 1 hour between therapy sessions. PT Short Term Goal 1 - Progress (Week 1): Progressing toward goal PT Short Term Goal 2 (Week 1): Pt will perform sit<>stands with +2 total assist PT Short Term Goal 2 - Progress (Week 1): Met PT Short Term Goal 3 (Week 1): Pt will complete bed<>chair transfers with +2 total assist PT Short Term Goal 3 - Progress (Week 1): Met PT Short Term Goal 4 (Week 1): Pt will demonstrate improved sitting balance with ability to maintain static sitting for at least 2 minutes without UE support PT Short Term Goal 4 - Progress (Week 1): Progressing toward goal Week 2:  PT Short Term Goal 1 (Week 2): Pt will tolerate upright, OOB sitting for at least 1 hour between therapy sessions. PT Short Term Goal 2 (Week 2): Pt will demonstrate improved sitting balance with ability to maintain static sitting for at least 2 minutes without UE support PT Short Term Goal 3 (Week 2): Pt will perform chair <>bed transfers w/max A x1 Week 3:     Skilled Therapeutic Interventions/Progress Updates:   Pt received supine in bed and agreeable to PT. Supine>sit transfer with max assist and cues for attention to task and safety. Pt more internally distracted this session compared to AM session.  Donned shoes in supine for safety. Stedy transfer to [facility] with max assist of 1. Pt transported to rehab gym with encouragement. Pt continually asking to speak on phone with wife, redirected with ease. Pt engaged in checkers gam in sitting with mod assist for anterior weight shift to reach top of tall board. Pt able to follow ruls of the game 50% of the time with max cues for awareness of  error for ~10 minutes.  Pt returned to room and performed stedy transfer to bed with total A. Sit>supine completed with max A and left supine in bed with call bell in reach, wrist restraints in place, and all needs met.        Therapy Documentation Precautions:  Precautions Precautions: Fall, Other (comment) Precaution Comments: supplemental O2 via [place], L hemi, impaired safety awareness, impulsive, NPO Restrictions Weight Bearing Restrictions: No    Vital Signs: Therapy Vitals Temp: 98.2 F (36.8 C) Pulse Rate: 73 Resp: 18 BP: 123/83 Patient Position (if appropriate): Sitting Oxygen Therapy SpO2: 95 % O2 Device: Room Air Pain: denies    Therapy/Group: Individual Therapy  Lorie Phenix 10/12/2020, 3:34 PM

## 2020-10-12 NOTE — Progress Notes (Signed)
   Palliative Medicine Inpatient Follow Up Note  Consulting Provider: Izora Ribas, MD   Reason for consult:   [facility] Palliative Medicine Consult  Reason for Consult? patient expresses that he wants to die    HPI:  Per intake H&P --> 55 year old male with past medical history of of COPD, diabetes mellitus type 2, hyperlipidemia, hypertension, obstructive sleep apnea, gastroesophageal reflux disease, cocaine abuse with recent hospitalization for acute stroke now currently a patient in the inpatient rehabilitation unit.   Shaye was seen by the Palliative care team 7/28 - 8/5 as in inpatient. His goals at that time were clear for full scope of treatment and to ideally improve.    Palliative care has been asked to re-consult as patient has made some passive statements about "wanting to die."  Today's Discussion (10/12/2020):  *Please note that this is a verbal dictation therefore any spelling or grammatical errors are due to the "[employer]" system interpretation.  Chart reviewed.   I met with Halil this afternoon, he remains to be anxious and perseverant on when his significant other will get here. I shared with him that [name] plans on coming this afternoon. We reviewed that tomorrow his family will visit so that we may all discuss the next steps in his care plan.  He continued to ask me to call his significant other though was redirectable.   Maximiano again shares the plan to transition to the [facility] after this hospital stay. He asked me my opinion on this rehab though I shared I do not travel to the rehabilitation.  Questions and concerns addressed   Objective Assessment: Vital Signs Vitals:   10/12/20 0442 10/12/20 1306  BP: 134/67 123/83  Pulse: 78 73  Resp: 18 18  Temp: 98.7 F (37.1 C) 98.2 F (36.8 C)  SpO2: 97% 95%    Intake/Output Summary (Last 24 hours) at 10/12/2020 1632 Last data filed at 10/12/2020 1234 Gross per 24  hour  Intake 0 ml  Output 600 ml  Net -600 ml    Last Weight  Most recent update: 10/01/2020  4:17 AM    Weight  81.4 kg (179 lb 7.3 oz)            Gen:  Middle aged caucasian M in NAD HEENT: moist mucous membranes CV: Regular rate and rhythm  PULM: On 2LPM [place] ABD: soft/nontender  EXT: No edema  Neuro: Alert and oriented x2  SUMMARY OF RECOMMENDATIONS   Full Code / Full Scope of Care   Plan to meet at [facility] on Sunday with patients mother, significant other, and two children   Anxiety management per primary team   Ongoing support by PMT team  Time Spent: 15 Greater than 50% of the time was spent in counseling and coordination of care ______________________________________________________________________________________ [place] Team Team Cell Phone: [phone] Please utilize secure chat with additional questions, if there is no response within 30 minutes please call the above phone number  Palliative Medicine Team providers are available by phone from 7am to 7pm daily and can be reached through the team cell phone.  Should this patient require assistance outside of these hours, please call the patient's attending physician.

## 2020-10-12 NOTE — Progress Notes (Signed)
Nurse entered room and observed pt pulling at left wrist restraint with teeth. Pt redirected and repositioned, restraints reinforced

## 2020-10-12 NOTE — Progress Notes (Signed)
Spouse leaving for the night, restraints placed back on pt. Spouse and family member present.

## 2020-10-12 NOTE — Progress Notes (Signed)
Pt alert and aware sitting up in bed his wife has just finished giving him a shave. I explained to him why I was there. And he replied that a lady chaplain had been by to see him. I offered caring and supportive presence, prayers and blessings.

## 2020-10-12 NOTE — Progress Notes (Signed)
Nurse returned call to spouse with update on pt status

## 2020-10-12 NOTE — Progress Notes (Signed)
Situation: Chaplain Medinas-Lockley responding to page regarding pt Brent [name] requesting a chaplain.  Background: Facts: Please see previous chaplain and social work notes for more background. Per RN, pt has a "history of drug use," and [name] shares that he recently had a "stroke". [name] was in restraints at the time of the visit.  Family: Not discussed at this time. Per chart notes, [name] has a girlfriend, Clydie Braun, who he lives with. He also has a son and a daughter. Per notes, he has had family visits throughout this past week. Feelings: [name] expressed frustration during today's visit, sharing "They won't leave me alone. I'll leave them alone if they leave me alone." He also expressed a desire to be released from his restraints. When asked why he was restrained, [name] shared that it's because he "keeps pulling at [employer] leads on] his belly." [name] also shared that he "just wants to go home." Faith: [name] shared that in previous chaplain visits, chaplains have offered prayer and that he would appreciate prayer at this time.  Actions & Assessments: Chaplain offered compassionate presence and active listening as well as prayer. [name] seems to be frustrated at his current situation and grieving loss of control.  Recommendations: For Chaplains: Continue to encourage [name] to focus on the present moment and what he can control as well as working together with the medical team for his well-being. For Staff: Chaplain remains available for follow-up spiritual/emotional support as needed.  Rev. Mayme Genta, MDiv      10/12/20 0300  Clinical Encounter Type  Visited With Patient  Visit Type Follow-up;Spiritual support  Referral From Nurse  Spiritual Encounters  Spiritual Needs Prayer;Emotional  Stress Factors  Patient Stress Factors Loss of control

## 2020-10-12 NOTE — Progress Notes (Signed)
Physical Therapy Session Note  Patient Details  Name: Brent [name] MRN: [id number] Date of Birth: Jan 03, 1966  Today's Date: 10/12/2020 PT Individual Time: 1006-1100 PT Individual Time Calculation (min): 54 min   Short Term Goals:  Week 2:  PT Short Term Goal 1 (Week 2): Pt will tolerate upright, OOB sitting for at least 1 hour between therapy sessions. PT Short Term Goal 2 (Week 2): Pt will demonstrate improved sitting balance with ability to maintain static sitting for at least 2 minutes without UE support PT Short Term Goal 3 (Week 2): Pt will perform chair <>bed transfers w/max A x1  Skilled Therapeutic Interventions/Progress Updates:   Pt received supine in bed asleep, aroused with ease. Pt agreeable to PT. Supine>sit transfer with mod assist and cues for attention to the LLE. Sitting balance EOB with min assist while PT assisting to don bil shoes. Sit<>stand at EOB with max assist from elevated bed. PT pulled pants to waist with total A while standing with mod-max assist. Upper body dressing sitting in WC with max assist for attention to the L for hemi tehcnique.   Pt transported to rehab gym in [facility]. Sit<>stand with mod-max assist and LLE blocked x 2. Pt able to maintain standing 2 x 30sec with LLE block and moderate cues for weight shift L to improve midline orientation. Pt then reports need for BM.  Transported to room in [facility]. Stedy transfer to toilet with max assist + 2 for safety and equipement management. Pt able to void once sitting on BSC over toilet. Standing tolerance x 45 sec in stedy for peri care and clothing management performed with total A while standing with max assist.   Pt returned to room and performed stedy transfer to bed with +2 assist. Tone noted to engage on the LLE causing WB thorugh L knee on shin pad. Corrected with max assist. Sit>supine completed with mod assist, and left supine in bed with call bell in reach, wrist restraints in place, and all needs met.         Therapy Documentation Precautions:  Precautions Precautions: Fall, Other (comment) Precaution Comments: supplemental O2 via [place], L hemi, impaired safety awareness, impulsive, NPO Restrictions Weight Bearing Restrictions: No  Vital Signs: Oxygen Therapy O2 Device: Nasal Cannula O2 Flow Rate (L/min): 2 L/min Pain: Pain Assessment Pain Scale: 0-10 Pain Score: 0-No pain Faces Pain Scale: No hurt   Therapy/Group: Individual Therapy  Lorie Phenix 10/12/2020, 11:03 AM

## 2020-10-12 NOTE — Progress Notes (Signed)
[place] PHYSICAL MEDICINE & REHABILITATION PROGRESS NOTE  Subjective/Complaints:  Pt saw chaplain again overnight-  Asks for them regularly Back in restraints last night because kept pulling at PEG and O2, etc- trying to get OOB.  Very upset "woke up in restraints"- doesn't remember what might have done to cause this, but admits has been pulling at PEG-  Made agreement if showed he wouldn't pull at lines, would take off restraints ( didn't remember and kept pulling)- and then made agreement later that would take off when spouse is here, but has to put back on.   ROS: limited by cognition  Objective: Vital Signs: Blood pressure 123/83, pulse 73, temperature 98.2 F (36.8 C), resp. rate 18, height 6' (1.829 m), weight 81.4 kg, SpO2 95 %. No results found. No results for input(s): WBC, HGB, HCT, PLT in the last 72 hours.  No results for input(s): NA, K, CL, CO2, GLUCOSE, BUN, CREATININE, CALCIUM in the last 72 hours.   Intake/Output Summary (Last 24 hours) at 10/12/2020 1536 Last data filed at 10/12/2020 1234 Gross per 24 hour  Intake 0 ml  Output 600 ml  Net -600 ml          Physical Exam: BP 123/83 (BP Location: Right Arm)   Pulse 73   Temp 98.2 F (36.8 C)   Resp 18   Ht 6' (1.829 m)   Wt 81.4 kg   SpO2 95%   BMI 24.34 kg/m     General: awake, alert, but very confused; perseverative; cannot stop talking about restraints to explain any other issues; Nursing in room; NAD HENT: conjugate gaze; oropharynx moist CV: regular rate; no JVD Pulmonary: CTA B/L; no W/R/R- good air movement GI: soft, NT, ND, (+)BS; PEG covered with abd binder backwards, but still digging underneath (in spite of wrist restraints) as much as he can to pull at PEG.  Psychiatric: confused- frustrated/angry when wouldn't remove wrist restraints Neurological: not oriented.   Musc: No edema in extremities.  No tenderness in extremities. Dysarthria, unchanged LUE: Shoulder abduction, elbow  flexion/extension 3/5, handgrip 3/5, improving LLE: 3-/5 proximal distal  Assessment/Plan: 1. Functional deficits which require 3+ hours per day of interdisciplinary therapy in a comprehensive inpatient rehab setting. Physiatrist is providing close team supervision and 24 hour management of active medical problems listed below. Physiatrist and rehab team continue to assess barriers to discharge/monitor patient progress toward functional and medical goals   Care Tool:  Bathing    Body parts bathed by patient: Abdomen, Chest, Face, Right arm, Left arm   Body parts bathed by helper:  (pt declined bathing all other body parts) Body parts n/a: Front perineal area, Buttocks   Bathing assist Assist Level: Moderate Assistance - Patient 50 - 74%     Upper Body Dressing/Undressing Upper body dressing   What is the patient wearing?: Pull over shirt    Upper body assist Assist Level: Moderate Assistance - Patient 50 - 74%    Lower Body Dressing/Undressing Lower body dressing      What is the patient wearing?: Pants, Underwear/pull up     Lower body assist Assist for lower body dressing: Maximal Assistance - Patient 25 - 49%     Toileting Toileting Toileting Activity did not occur (Clothing management and hygiene only): N/A (no void or bm)  Toileting assist Assist for toileting: 2 Helpers     Transfers Chair/bed transfer  Transfers assist  Chair/bed transfer activity did not occur: Safety/medical concerns  Chair/bed transfer assist  level: Maximal Assistance - Patient 25 - 49%     Locomotion Ambulation   Ambulation assist   Ambulation activity did not occur: Safety/medical concerns          Walk 10 feet activity   Assist  Walk 10 feet activity did not occur: Safety/medical concerns        Walk 50 feet activity   Assist Walk 50 feet with 2 turns activity did not occur: Safety/medical concerns         Walk 150 feet activity   Assist Walk 150 feet  activity did not occur: Safety/medical concerns         Walk 10 feet on uneven surface  activity   Assist Walk 10 feet on uneven surfaces activity did not occur: Safety/medical concerns         Wheelchair     Assist Is the patient using a wheelchair?: Yes Type of Wheelchair: Manual Wheelchair activity did not occur: Safety/medical concerns         Wheelchair 50 feet with 2 turns activity    Assist    Wheelchair 50 feet with 2 turns activity did not occur: Safety/medical concerns       Wheelchair 150 feet activity     Assist  Wheelchair 150 feet activity did not occur: Safety/medical concerns        Medical Problem List and Plan: 1.   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 Hx of prior stroke 6/22.  D/c to SNF when bed available.   Continue CIR- PT, OT and SLP 2.  Impaired mobility: Discontinue Lovenox given bleeding from PEG site.              -antiplatelet therapy: Has completed 3 weeks of Plavix, continue Aspirin 81mg  daily.  3. Low back pain: d/c IV Dilaudid. Discontinue Norco. Add lidocaine patch for lower back.   8/27- denies pain this AM- con't regimen 4. Anxiety: Continue Xanax as needed. Increased seroquel to 25mg  HS. Psych consulted as per mother's request. Can consult chaplain at night as well if patient is agitated- they have 247 service  8/27- they consulted chaplain last night- didn't help to keep off restraints/wrist- last night- cannot stop pulling at lines/PEG/O2 5. Neuropsych: This patient is not capable of making decisions on his own behalf.   Telemetry sitter for safety, continue  Continue trial off wrist restraints.   8/27- had to replace restraints- taking off when family here 6. Skin/Wound Care: Routine skin checks 7.  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-Continue routine PEG care  -SLP follow up, continue to advance diet as tolerated 9. Cocaine/tobacco abuse.UDS positive cocaine.  Counseled on appropriate 10.  Diabetes mellitus.  Hemoglobin A1c 6.4.   Diabetic teaching when appropriate  Levemir decreased to 8 on 8/17  Discontinue Novolog- no more hypoglycemic episodes after this CBG (last 3)  Recent Labs    10/12/20 0415 10/12/20 0807 10/12/20 1146  GLUCAP 97 101* 305*  Increase Levemir to 6U  8/27- BG's controlled- con't regimen 11.  Hyperlipidemia.  Crestor/fenofibrate 12.Aspiration pneumonia.  Completed 14 days antibiotic 13.  Constipation.  MiraLAX 14.  Acute blood loss anemia  Hemoglobin 7.6 on 8/17, up to 7.8 on 8/18, plavix resumed as per GI recommendations. Continue PPI indefinitely. Repeat  CBC periodically.   Endoscopy shows no ulcers 15.  Leukocytosis  CBC stable, remains afebrile  16.  AKI: Resolved  Creatinine 0.81 on 8/16  IVF initiated, IVF DC'd 17. Hypernatremia: Sodium 139 on 8/16, improved 18. HTN: started propanolol 10mg  daily.   Vitals:   10/12/20 0442 10/12/20 1306  BP: 134/67 123/83  Pulse: 78 73  Resp: 18 18  Temp: 98.7 F (37.1 C) 98.2 F (36.8 C)  SpO2: 97% 95%   Fair control 8/21 19. Suicidal ideation: chaplain consulted. Palliative care consulted  LOS: 15 days A FACE TO FACE EVALUATION WAS PERFORMED  Tammi Boulier 10/12/2020, 3:36 PM

## 2020-10-12 NOTE — Progress Notes (Addendum)
Patient trying to remove clothing by tearing and biting on it. Also removing abdominal binder, diaper and trying to reach the side of the rail to bring it down. Patient agitated and restless tonight. MD called with order for bilateral wrist restraints. Chaplain has been requested by the patient. Patient was told that it might take a while before chaplain can see him. Message sent to chaplain by charge rn. Will continue to monitor.  Called significant other this morning and left a message to return the call.

## 2020-10-12 NOTE — Progress Notes (Signed)
Nurse discussed restraint order with MD. Restraint can be removed while spouse is present, replaced when they leave. If pt is non compliant while spouse is present restraints are to be placed on pt.

## 2020-10-13 ENCOUNTER — Inpatient Hospital Stay (HOSPITAL_COMMUNITY): Payer: Medicaid Other

## 2020-10-13 LAB — GLUCOSE, CAPILLARY
Glucose-Capillary: 109 mg/dL — ABNORMAL HIGH (ref 70–99)
Glucose-Capillary: 127 mg/dL — ABNORMAL HIGH (ref 70–99)
Glucose-Capillary: 133 mg/dL — ABNORMAL HIGH (ref 70–99)
Glucose-Capillary: 200 mg/dL — ABNORMAL HIGH (ref 70–99)
Glucose-Capillary: 217 mg/dL — ABNORMAL HIGH (ref 70–99)

## 2020-10-13 IMAGING — DX DG ABD PORTABLE 1V
1 series · 1 of 1 positions shown · non-contrast
Comparison: [DATE].

CLINICAL DATA: Feeding tube placement.

EXAM:
PORTABLE ABDOMEN - 1 VIEW

[abdomen]
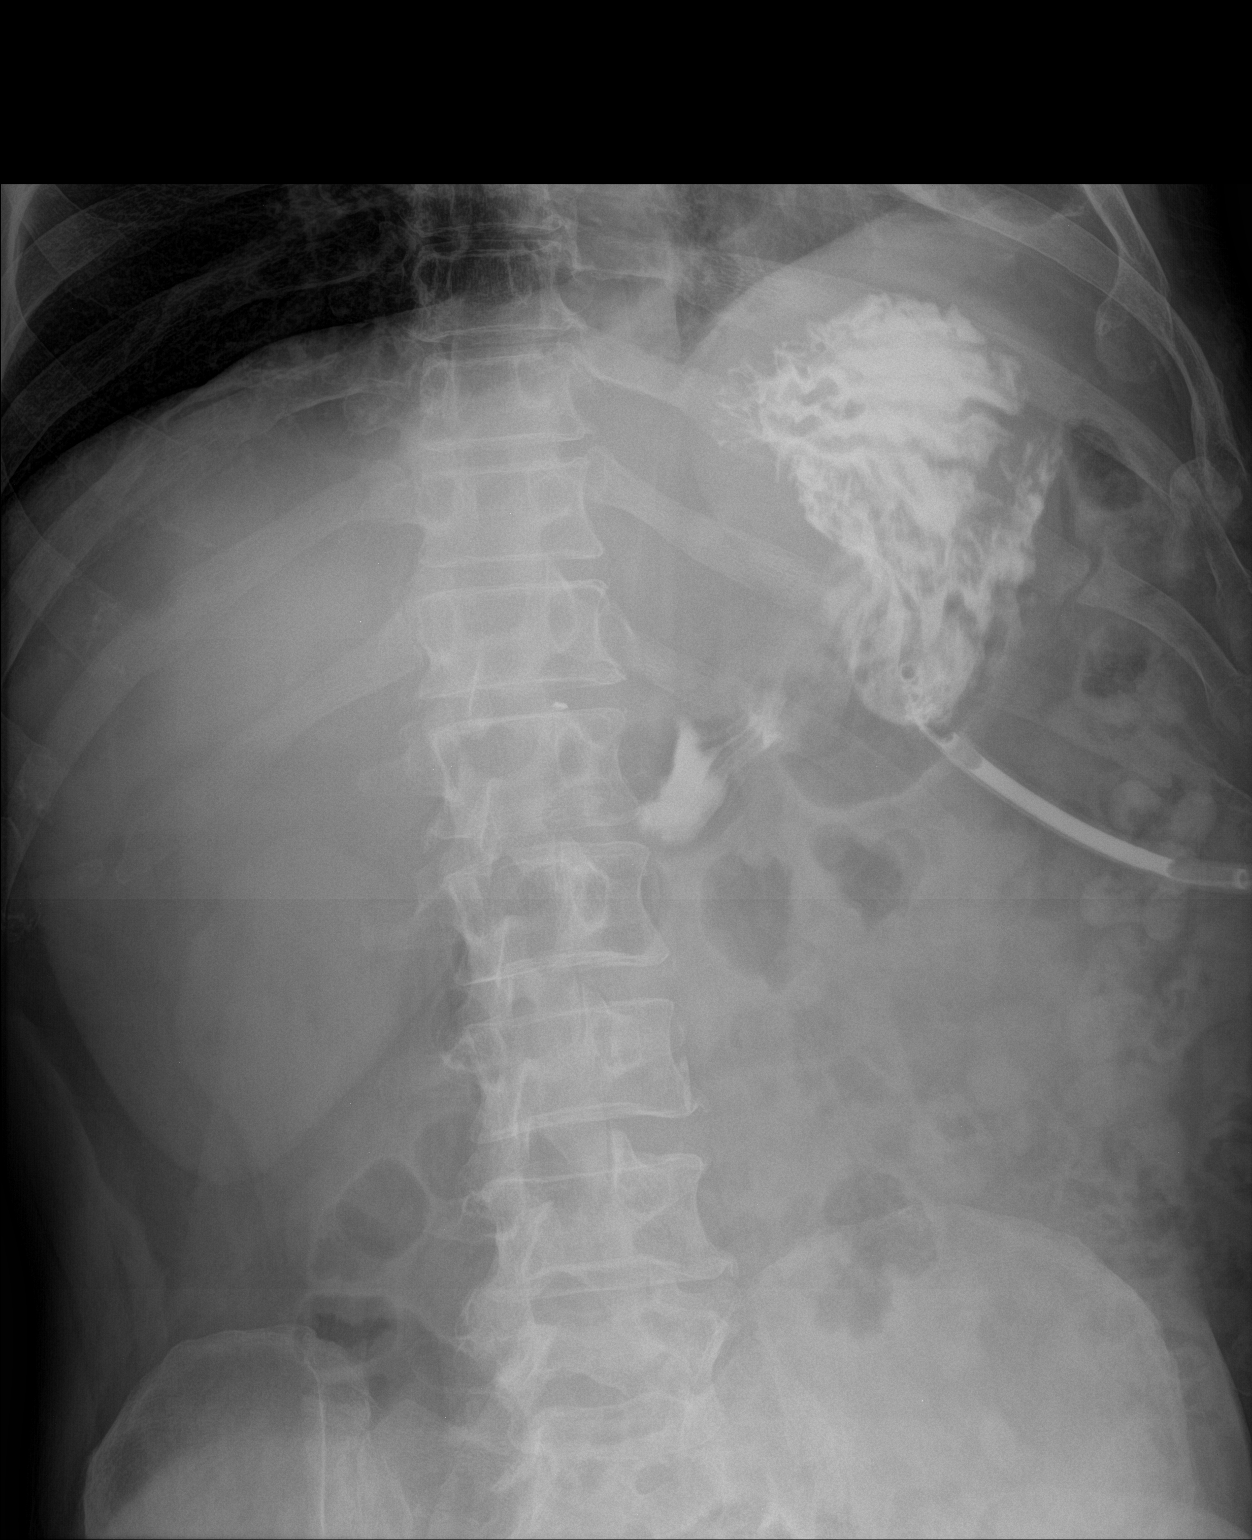

[1 of 1 positions shown; findings below may reference images not displayed]

FINDINGS: The bowel gas pattern is normal. Gastrostomy tube tip appears to be
well position within gastric lumen. Contrast was injected which is
seen filling gastric lumen. No extravasation or leakage is noted. No
radio-opaque calculi or other significant radiographic abnormality
are seen.
IMPRESSION: Gastrostomy tube tip in grossly good position. No abnormal bowel
dilatation.

## 2020-10-13 MED ORDER — DIATRIZOATE MEGLUMINE & SODIUM 66-10 % PO SOLN
ORAL | Status: AC
  Administered 2020-10-13: 30 mL via GASTROSTOMY
  Filled 2020-10-13: qty 30

## 2020-10-13 MED ORDER — DIATRIZOATE MEGLUMINE & SODIUM 66-10 % PO SOLN
30.0000 mL | Freq: Once | ORAL | Status: AC
  Administered 2020-10-13: 30 mL
  Filled 2020-10-13: qty 30

## 2020-10-13 NOTE — Progress Notes (Addendum)
Palliative Medicine Inpatient Follow Up Note  Consulting Provider: Izora Ribas, MD   Reason for consult:   [place] Palliative Medicine Consult  Reason for Consult? patient expresses that he wants to die    HPI:  Per intake H&P --> 55 year old male with past medical history of of COPD, diabetes mellitus type 2, hyperlipidemia, hypertension, obstructive sleep apnea, gastroesophageal reflux disease, cocaine abuse with recent hospitalization for acute stroke now currently a patient in the inpatient rehabilitation unit.   Cleaven was seen by the Palliative care team 7/28 - 8/5 as in inpatient. His goals at that time were clear for full scope of treatment and to ideally improve.    Palliative care has been asked to re-consult as patient has made some passive statements about "wanting to die."  Today's Discussion (10/13/2020):  *Please note that this is a verbal dictation therefore any spelling or grammatical errors are due to the "[place]" system interpretation.  Family Meeting:  Participants: [name], [name] (speaker-phone), and Jeanice Lim  Providers: Tacey Ruiz NP  I met with Jerron and his family this afternoon. I introduced Palliative Medicine as specialized medical care for people living with serious illness. It focuses on providing relief from the symptoms and stress of a serious illness. The goal is to improve quality of life for both the patient and the family. We discussed that the Palliative team can guide and support patients at any stage of their illness.   A review of Sai's present health conditions was completed. We discussed his multiple strokes and how his most recent one seems to have had devastating effects on his mental and physical level of functioning. We reviewed his severe anxiety and ongoing perseveration's. Family shares that they understand he may take months to declare where his new baseline  level of functioning is.  We discussed the best case scenario of [place] continuing improvements and transitioning to a rehabilitation where this would continue until he is able to go back to his home. We also discussed the worst case scenario which is that [place] continues to have ongoing declines whether that be through infections, infarctions, or his ongoing encephalopathy. We reviewed that most patients end up somewhere in the middle of these two extremes.   We reviewed that prior to the hospitalization, Roniel was living in his home with [name]. She would help him with day to day activities. Presently his family realizes that the level of care needed for Amor will be 24/7 and that they are not able to provide this. We reviewed that he would best be served in a facility and the family is most hopeful that he can go to the [facility] which is next to their family home. This would enable regular visitation by his children. I shared that I will certainly endorse this to the [facility] team.  We reviewed that R[place] is presently a "full code" and that I would worry if he were to have a respiratory or cardiopulmonary emergency with his already declined health state that he would likely not fair well. I provided the family information on why this is a concern through the "Hard Choice for Loving People" booklet. Patients mother states that in her option she would not want to put his body through extraneous efforts that will not benefit him and his daughter agress. They would like to speak to the patients son, Roselyn Reef about this prior to making a confirmatory decision.   We also discussed in  the worst case scenario if Shaden's condition neglects to improve and that he does keep on worsening the idea of hospice care.  I described hospice as a service for patients for have a life expectancy of < 6 months. It preserves dignity and quality at the end phases of life. The focus changes from curative to symptom relief.  I shared that presently it does not appear that we are at this point though it may be a reality at some point in the future. Patients family was receptive to this. They requested OP Palliative continued care for an extra layer of support on discharge.  Meeting ended with a summary of the above. Patients family was grateful for having [place] all together to discuss these very difficult topics.   [place] was present though his level of understanding the conversation was poor as he was unable to repeat back in detail to me regarding matters of his health state and code status. He continued to say that he will "never smoke crack again" throughout or time together.   Objective Assessment: Vital Signs Vitals:   10/12/20 1932 10/13/20 0307  BP: (!) 159/83 (!) 151/85  Pulse: 71 86  Resp: 18 18  Temp: 97.7 F (36.5 C) 98.2 F (36.8 C)  SpO2: 98% 94%   No intake or output data in the 24 hours ending 10/13/20 1542  Last Weight  Most recent update: 10/01/2020  4:17 AM    Weight  81.4 kg (179 lb 7.3 oz)            Gen:  Middle aged caucasian M in NAD HEENT: moist mucous membranes CV: Regular rate and rhythm  PULM: On 2LPM [place] ABD: soft/nontender  EXT: No edema  Neuro: Alert and oriented x2  SUMMARY OF RECOMMENDATIONS   Full Code / Full Scope of Care --> Asked family to strongly consider DNR they are talking amongst themselves. A MOST form was provided  On discharge patients first choice is the [facility] per primary team  TOC - OP Palliative support   Ongoing support by PMT team  Time Spent: 80 Greater than 50% of the time was spent in counseling and coordination of care ______________________________________________________________________________________ [place] Team Team Cell Phone: [phone] Please utilize secure chat with additional questions, if there is no response within 30 minutes please call the above phone  number  Palliative Medicine Team providers are available by phone from 7am to 7pm daily and can be reached through the team cell phone.  Should this patient require assistance outside of these hours, please call the patient's attending physician.

## 2020-10-13 NOTE — Progress Notes (Signed)
Nurse observed pt with g tube in hand. G tube assessed to be 4 cm from original placement. MD notified, KUB ordered to check placement. Medications and feedings held.

## 2020-10-13 NOTE — Progress Notes (Signed)
[place] PHYSICAL MEDICINE & REHABILITATION PROGRESS NOTE  Subjective/Complaints:   Pt reports that he "won't pull" on PEG however in spite of being in wrist restraints, still pulled part of PEG out- ~ 4cc- KUB ordered and pending.   Pt also c/o being hungry, but knows he cannot be fed- which is new for him to understand/mild cognitive improvement.      ROS: limited by cognition  Objective: Vital Signs: Blood pressure (!) 151/85, pulse 86, temperature 98.2 F (36.8 C), temperature source Oral, resp. rate 18, height 6' (1.829 m), weight 81.4 kg, SpO2 94 %. No results found. No results for input(s): WBC, HGB, HCT, PLT in the last 72 hours.  No results for input(s): NA, K, CL, CO2, GLUCOSE, BUN, CREATININE, CALCIUM in the last 72 hours.  No intake or output data in the 24 hours ending 10/13/20 1531         Physical Exam: BP (!) 151/85 (BP Location: Right Arm)   Pulse 86   Temp 98.2 F (36.8 C) (Oral)   Resp 18   Ht 6' (1.829 m)   Wt 81.4 kg   SpO2 94%   BMI 24.34 kg/m      General: awake, alert, but confused; laying in bed with wrist restraints in place and abd binder on backwards;  NAD HENT: conjugate gaze; oropharynx moist CV: regular rate; no JVD Pulmonary: CTA B/L; no W/R/R- good air movement GI: soft, NT, ND, (+)BS- when I saw pt, PEG covered/in place Psychiatric: confused and frustrated about restraints, but pulled PEG partially out again Neurological: Ox3    Musc: No edema in extremities.  No tenderness in extremities. Dysarthria, unchanged LUE: Shoulder abduction, elbow flexion/extension 3/5, handgrip 3/5, improving LLE: 3-/5 proximal distal  Assessment/Plan: 1. Functional deficits which require 3+ hours per day of interdisciplinary therapy in a comprehensive inpatient rehab setting. Physiatrist is providing close team supervision and 24 hour management of active medical problems listed below. Physiatrist and rehab team continue to assess barriers  to discharge/monitor patient progress toward functional and medical goals   Care Tool:  Bathing    Body parts bathed by patient: Abdomen, Chest, Face, Right arm, Left arm   Body parts bathed by helper:  (pt declined bathing all other body parts) Body parts n/a: Front perineal area, Buttocks   Bathing assist Assist Level: Moderate Assistance - Patient 50 - 74%     Upper Body Dressing/Undressing Upper body dressing   What is the patient wearing?: Pull over shirt    Upper body assist Assist Level: Moderate Assistance - Patient 50 - 74%    Lower Body Dressing/Undressing Lower body dressing      What is the patient wearing?: Pants, Underwear/pull up     Lower body assist Assist for lower body dressing: Maximal Assistance - Patient 25 - 49%     Toileting Toileting Toileting Activity did not occur (Clothing management and hygiene only): N/A (no void or bm)  Toileting assist Assist for toileting: 2 Helpers     Transfers Chair/bed transfer  Transfers assist  Chair/bed transfer activity did not occur: Safety/medical concerns  Chair/bed transfer assist level: Maximal Assistance - Patient 25 - 49%     Locomotion Ambulation   Ambulation assist   Ambulation activity did not occur: Safety/medical concerns          Walk 10 feet activity   Assist  Walk 10 feet activity did not occur: Safety/medical concerns        Walk 50 feet  activity   Assist Walk 50 feet with 2 turns activity did not occur: Safety/medical concerns         Walk 150 feet activity   Assist Walk 150 feet activity did not occur: Safety/medical concerns         Walk 10 feet on uneven surface  activity   Assist Walk 10 feet on uneven surfaces activity did not occur: Safety/medical concerns         Wheelchair     Assist Is the patient using a wheelchair?: Yes Type of Wheelchair: Manual Wheelchair activity did not occur: Safety/medical concerns         Wheelchair 50  feet with 2 turns activity    Assist    Wheelchair 50 feet with 2 turns activity did not occur: Safety/medical concerns       Wheelchair 150 feet activity     Assist  Wheelchair 150 feet activity did not occur: Safety/medical concerns        Medical Problem List and Plan: 1.   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 Hx of prior stroke 6/22.  D/c to SNF when bed available.   Continue CIR- PT, OT and SLP- needs restraints because keeps removing the only means of nutrition he has and is unsafe/trying to get OOB/fall.  2.  Impaired mobility: Discontinue Lovenox given bleeding from PEG site.              -antiplatelet therapy: Has completed 3 weeks of Plavix, continue Aspirin 81mg  daily.  3. Low back pain: d/c IV Dilaudid. Discontinue Norco. Add lidocaine patch for lower back.   8/28- denies pain- con't regimen 4. Anxiety: Continue Xanax as needed. Increased seroquel to 25mg  HS. Psych consulted as per mother's request. Can consult chaplain at night as well if patient is agitated- they have 247 service  8/27- they consulted chaplain last night- didn't help to keep off restraints/wrist- last night- cannot stop pulling at lines/PEG/O2  8/28- saw chaplain again last night but continues ot pull at PEG 5. Neuropsych: This patient is not capable of making decisions on his own behalf.   Telemetry sitter for safety, continue  Continue trial off wrist restraints.   8/27- had to replace restraints- taking off when family here 6. Skin/Wound Care: Routine skin checks 7. 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-Continue routine PEG care  -SLP follow up, continue to advance diet as  tolerated  8/28- getting KUB to see if can use PEG or not? Pulled out partally 9. Cocaine/tobacco abuse.UDS positive cocaine.  Counseled on appropriate 10.  Diabetes mellitus.  Hemoglobin A1c 6.4.   Diabetic teaching when appropriate  Levemir decreased to 8 on 8/17  Discontinue Novolog- no more hypoglycemic episodes after this CBG (last 3)  Recent Labs    10/13/20 0022 10/13/20 0532 10/13/20 1114  GLUCAP 127* 109* 217*  Increase Levemir to 6U  8/28- BG's controlled- con't regimen  -will recheck CMP, since on Tfs and actually being held today 11.  Hyperlipidemia.  Crestor/fenofibrate 12.Aspiration pneumonia.  Completed 14 days antibiotic 13.  Constipation.  MiraLAX 14.  Acute blood loss anemia  Hemoglobin 7.6 on 8/17, up to 7.8 on 8/18, plavix resumed as per GI recommendations. Continue PPI indefinitely. Repeat CBC periodically.   Endoscopy shows  no ulcers  8/28- recheck labs in AM 15.  Leukocytosis  CBC stable, remains afebrile  16.  AKI: Resolved  Creatinine 0.81 on 8/16  IVF initiated, IVF DC'd 17. Hypernatremia: Sodium 139 on 8/16, improved 18. HTN: started propanolol 10mg  daily.   Vitals:   10/12/20 1932 10/13/20 0307  BP: (!) 159/83 (!) 151/85  Pulse: 71 86  Resp: 18 18  Temp: 97.7 F (36.5 C) 98.2 F (36.8 C)  SpO2: 98% 94%    8/28- BP somewhat high, but cannot give meds right now- if gets elevated, will give IV hydralazine? 19. Suicidal ideation: chaplain consulted. Palliative care consulted   I spent a total of 35 minutes on total care- >50% coordination of care- d/w pt nursing 3 different times and seeing pt 2x- also ordering KUB- and writing for restraints.    LOS: 16 days A FACE TO FACE EVALUATION WAS PERFORMED  Brent [name] 10/13/2020, 3:31 PM

## 2020-10-14 DIAGNOSIS — Z66 Do not resuscitate: Secondary | ICD-10-CM

## 2020-10-14 LAB — COMPREHENSIVE METABOLIC PANEL
ALT: 11 U/L (ref 0–44)
AST: 13 U/L — ABNORMAL LOW (ref 15–41)
Albumin: 2.9 g/dL — ABNORMAL LOW (ref 3.5–5.0)
Alkaline Phosphatase: 61 U/L (ref 38–126)
Anion gap: 7 (ref 5–15)
BUN: 18 mg/dL (ref 6–20)
CO2: 27 mmol/L (ref 22–32)
Calcium: 9.6 mg/dL (ref 8.9–10.3)
Chloride: 104 mmol/L (ref 98–111)
Creatinine, Ser: 0.87 mg/dL (ref 0.61–1.24)
GFR, Estimated: >60 mL/min (ref >60)
Glucose, Bld: 134 mg/dL — ABNORMAL HIGH (ref 70–99)
Potassium: 4 mmol/L (ref 3.5–5.1)
Sodium: 138 mmol/L (ref 135–145)
Total Bilirubin: 0.3 mg/dL (ref 0.3–1.2)
Total Protein: 6.2 g/dL — ABNORMAL LOW (ref 6.5–8.1)

## 2020-10-14 LAB — CBC WITH DIFFERENTIAL/PLATELET
Abs Immature Granulocytes: 0.03 10*3/uL (ref 0.00–0.07)
Basophils Absolute: 0.1 10*3/uL (ref 0.0–0.1)
Basophils Relative: 1 %
Eosinophils Absolute: 0.3 10*3/uL (ref 0.0–0.5)
Eosinophils Relative: 2 %
HCT: 29.6 % — ABNORMAL LOW (ref 39.0–52.0)
Hemoglobin: 9.8 g/dL — ABNORMAL LOW (ref 13.0–17.0)
Immature Granulocytes: 0 %
Lymphocytes Relative: 13 %
Lymphs Abs: 1.4 10*3/uL (ref 0.7–4.0)
MCH: 31.9 pg (ref 26.0–34.0)
MCHC: 33.1 g/dL (ref 30.0–36.0)
MCV: 96.4 fL (ref 80.0–100.0)
Monocytes Absolute: 0.8 10*3/uL (ref 0.1–1.0)
Monocytes Relative: 7 %
Neutro Abs: 8.3 10*3/uL — ABNORMAL HIGH (ref 1.7–7.7)
Neutrophils Relative %: 77 %
Platelets: 287 10*3/uL (ref 150–400)
RBC: 3.07 MIL/uL — ABNORMAL LOW (ref 4.22–5.81)
RDW: 15.2 % (ref 11.5–15.5)
WBC: 10.8 10*3/uL — ABNORMAL HIGH (ref 4.0–10.5)
nRBC: 0 % (ref 0.0–0.2)

## 2020-10-14 LAB — GLUCOSE, CAPILLARY
Glucose-Capillary: 102 mg/dL — ABNORMAL HIGH (ref 70–99)
Glucose-Capillary: 138 mg/dL — ABNORMAL HIGH (ref 70–99)
Glucose-Capillary: 171 mg/dL — ABNORMAL HIGH (ref 70–99)
Glucose-Capillary: 187 mg/dL — ABNORMAL HIGH (ref 70–99)
Glucose-Capillary: 96 mg/dL (ref 70–99)

## 2020-10-14 MED ORDER — ARIPIPRAZOLE 5 MG PO TABS
5.0000 mg | ORAL_TABLET | Freq: Every day | ORAL | Status: DC
  Administered 2020-10-14 – 2020-10-15 (×2): 5 mg
  Filled 2020-10-14 (×2): qty 1

## 2020-10-14 MED ORDER — ARIPIPRAZOLE 5 MG PO TABS: 5.0000 mg | ORAL_TABLET | Freq: Every day | ORAL | Status: DC

## 2020-10-14 NOTE — Progress Notes (Signed)
[place] PHYSICAL MEDICINE & REHABILITATION PROGRESS NOTE  Subjective/Complaints:   Pt in bed, upset about PEG. "When will this come out? I'm hungry. I want something to eat."   ROS: Limited due to cognitive/behavioral   Objective: Vital Signs: Blood pressure 134/83, pulse 84, temperature 98.7 F (37.1 C), resp. rate 20, height 6' (1.829 m), weight 81.4 kg, SpO2 98 %. DG Abd Portable 1V  Result Date: 10/13/2020 CLINICAL DATA:  Feeding tube placement. EXAM: PORTABLE ABDOMEN - 1 VIEW COMPARISON:  October 01, 2020. FINDINGS: The bowel gas pattern is normal. Gastrostomy tube tip appears to be well position within gastric lumen. Contrast was injected which is seen filling gastric lumen. No extravasation or leakage is noted. No radio-opaque calculi or other significant radiographic abnormality are seen. IMPRESSION: Gastrostomy tube tip in grossly good position. No abnormal bowel dilatation. Electronically Signed   By: Lupita Raider M.D.   On: 10/13/2020 16:22   Recent Labs    10/14/20 0636  WBC 10.8*  HGB 9.8*  HCT 29.6*  PLT 287    Recent Labs    10/14/20 0636  NA 138  K 4.0  CL 104  CO2 27  GLUCOSE 134*  BUN 18  CREATININE 0.87  CALCIUM 9.6    No intake or output data in the 24 hours ending 10/14/20 1212         Physical Exam: BP 134/83 (BP Location: Right Arm)   Pulse 84   Temp 98.7 F (37.1 C)   Resp 20   Ht 6' (1.829 m)   Wt 81.4 kg   SpO2 98%   BMI 24.34 kg/m      Constitutional: No distress . Vital signs reviewed. In restraints HEENT: NCAT, EOMI, oral membranes moist Neck: supple Cardiovascular: RRR without murmur. No JVD    Respiratory/Chest: CTA Bilaterally without wheezes or rales. Normal effort    GI/Abdomen: BS +, non-tender, non-distended. PEG in place, abd binder Ext: no clubbing, cyanosis, or edema Psych: labile, anxious    Musc: No edema in extremities.  No tenderness in extremities. Neuro: oriented x 3. Distracted, follows basic  commands Dysarthria, unchanged LUE: Shoulder abduction, elbow flexion/extension 3/5, handgrip 3/5, improving LLE: 3-/5 proximal distal  Assessment/Plan: 1. Functional deficits which require 3+ hours per day of interdisciplinary therapy in a comprehensive inpatient rehab setting. Physiatrist is providing close team supervision and 24 hour management of active medical problems listed below. Physiatrist and rehab team continue to assess barriers to discharge/monitor patient progress toward functional and medical goals   Care Tool:  Bathing    Body parts bathed by patient: Abdomen, Chest, Face, Right arm, Left arm   Body parts bathed by helper:  (pt declined bathing all other body parts) Body parts n/a: Front perineal area, Buttocks   Bathing assist Assist Level: Moderate Assistance - Patient 50 - 74%     Upper Body Dressing/Undressing Upper body dressing   What is the patient wearing?: Pull over shirt    Upper body assist Assist Level: Moderate Assistance - Patient 50 - 74%    Lower Body Dressing/Undressing Lower body dressing      What is the patient wearing?: Pants, Underwear/pull up     Lower body assist Assist for lower body dressing: Maximal Assistance - Patient 25 - 49%     Toileting Toileting Toileting Activity did not occur (Clothing management and hygiene only): N/A (no void or bm)  Toileting assist Assist for toileting: 2 Helpers     Transfers Chair/bed  transfer  Transfers assist  Chair/bed transfer activity did not occur: Safety/medical concerns  Chair/bed transfer assist level: Maximal Assistance - Patient 25 - 49%     Locomotion Ambulation   Ambulation assist   Ambulation activity did not occur: Safety/medical concerns          Walk 10 feet activity   Assist  Walk 10 feet activity did not occur: Safety/medical concerns        Walk 50 feet activity   Assist Walk 50 feet with 2 turns activity did not occur: Safety/medical  concerns         Walk 150 feet activity   Assist Walk 150 feet activity did not occur: Safety/medical concerns         Walk 10 feet on uneven surface  activity   Assist Walk 10 feet on uneven surfaces activity did not occur: Safety/medical concerns         Wheelchair     Assist Is the patient using a wheelchair?: Yes Type of Wheelchair: Manual Wheelchair activity did not occur: Safety/medical concerns         Wheelchair 50 feet with 2 turns activity    Assist    Wheelchair 50 feet with 2 turns activity did not occur: Safety/medical concerns       Wheelchair 150 feet activity     Assist  Wheelchair 150 feet activity did not occur: Safety/medical concerns        Medical Problem List and Plan: 1.   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 Hx of prior stroke 6/22.  D/c to SNF when bed available.   MCD/insurance has denied further stay   Pt is DNR  -appreciate palliative care assistance! 2.  Impaired mobility: Discontinue Lovenox given bleeding from PEG site.              -antiplatelet therapy: Has completed 3 weeks of Plavix, continue Aspirin 81mg  daily.  3. Low back pain: d/c IV Dilaudid. Discontinue Norco. Add lidocaine patch for lower back.   8/29- denies pain- con't regimen 4. Anxiety: Continue Xanax as needed. Increased seroquel to 25mg  HS. Psych consulted as per mother's request. Can consult chaplain at night as well if patient is agitated- they have 247 service  8/27- they consulted chaplain last night- didn't help to keep off restraints/wrist- last night- cannot stop pulling at lines/PEG/O2  8/29- continued suicidal ideations -request psych consult for recs   -add abilify for severe depression with anxiety and suicidal ideations 5.  Neuropsych: This patient is not capable of making decisions on his own behalf.   9/27 for safety, continue.   8/29 failed out of restraints. Continue for now. remove when family here 6. Skin/Wound Care: Routine skin checks 7. Fluids/Electrolytes/Nutrition: Routine in and outs   I personally reviewed the patient's labs today.   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 Cocaine/tobacco abuse.UDS positive cocaine.  Counseled on appropriate 10.  Diabetes mellitus.  Hemoglobin A1c 6.4.   Diabetic teaching when appropriate  Levemir decreased to 8 on 8/17  Discontinue Novolog- no more hypoglycemic episodes after this CBG (last 3)  Recent Labs    10/14/20 0000 10/14/20 0417 10/14/20 1146  GLUCAP 138* 96 171*  Increase Levemir to 6U  8/29 fair control- con't regimen    11.  Hyperlipidemia.  Crestor/fenofibrate 12.Aspiration pneumonia.  Completed 14 days antibiotic 13.  Constipation.  MiraLAX 14.  Acute blood loss anemia  Hemoglobin 7.6 on 8/17, up to 7.8 on 8/18, plavix resumed as per GI recommendations. Continue PPI indefinitely. Repeat CBC periodically.   Endoscopy shows no ulcers  8/29- hgb up to 9.8 15.  Leukocytosis  CBC stable, remains afebrile  16.  AKI: Resolved  Creatinine 0.81 on 8/16  IVF initiated, IVF DC'd 17. Hypernatremia: Sodium 139 on 8/16, improved 18. HTN: started propanolol 10mg  daily.   Vitals:   10/13/20 2052 10/14/20 0423  BP:  134/83  Pulse:  84  Resp:  20  Temp:  98.7 F (37.1 C)  SpO2: 96% 98%    8/29 fair  control      I spent a total of 35 minutes on total care- >50% coordination of care- d/w pt nursing 3 different times and seeing pt 2x- also ordering KUB- and writing for restraints.    LOS: 17 days A FACE TO FACE EVALUATION WAS PERFORMED  9/29 10/14/2020, 12:12 PM

## 2020-10-14 NOTE — Progress Notes (Signed)
Patient ID: Brent [name], male   DOB: [birth year], 55 y.o.   MRN: [id number] Pt's medicaid has denied his further stay here due to not reaching his goals and not meeting criteria for medical  necessity. Have reached out to MD to ask for a peer to peer if he feels needed and to re-fax information for a reconsideration. Have sent out FL2 and do not have a bed offer currently. Will start calling NH's and let Mom know of this.

## 2020-10-14 NOTE — Progress Notes (Signed)
Speech Language Pathology Daily Session Note  Patient Details  Name: Brent [name] MRN: [id number] Date of Birth: Dec 16, 1965  Today's Date: 10/14/2020 SLP Individual Time: 0830-0930 SLP Individual Time Calculation (min): 60 min  Short Term Goals: Week 3: SLP Short Term Goal 1 (Week 3): Patient will tolerate trials of smooth thick purees/pudding thick liquids with SLP only with minimal overt s/s aspiration/penetration. SLP Short Term Goal 2 (Week 3): Patient will attend to basic level functional task with modA cues for duration of 2-3 minutes. SLP Short Term Goal 3 (Week 3): Patient will follow commands to perform basic level functional tasks with modA cues. SLP Short Term Goal 4 (Week 3): Patient will use visual aids to orient to time/place/situation with minA cues.  Skilled Therapeutic Interventions: Pt seen for skilled ST with focus on cognitive and swallowing goals. Pt in soft wrist restraints throughout session, RN present at beginning of session administering meds. Pt readjusted in bed and HOB raised for PO trials. Pt consuming 3oz pudding with 1 significant coughing episode toward the end of trials, likely 2' fatigue factor and increased discoordination of swallow/airway protection as a result of medications administered prior. 3/3 trials ice chips with large choking episode, pt also demonstrates 2 episodes of poor secretion management during intake. SLP contacting wife via phone and updated on pt limited swallow progress and current cognitive function. SLP providing pt with "visitor log" so visitors can sign their name for pt to increase understanding/recall of support he is receiving. He remains very emotional during tx sessions and benefits from family and friend support. Pt left in bed with RT present and all needs met. Cont ST POC.   Pain Pain Assessment Pain Scale: 0-10 Pain Score: 0-No pain Faces Pain Scale: Hurts whole lot Pain Type: Acute pain Pain Location: Back Pain  Orientation: Lower Pain Descriptors / Indicators: Aching Pain Frequency: Intermittent Pain Onset: On-going Pain Intervention(s): Medication (See eMAR)  Therapy/Group: Individual Therapy  Dewaine Conger 10/14/2020, 10:27 AM

## 2020-10-14 NOTE — Progress Notes (Signed)
Patient restless, yelling out and calling even when staff in room. Perseverates on diaper being changed, although recently changed and currently dry. Dressing changed to peg site, site red, no drainage. Restraints off while in room. Turned onto side. PRN tylenol given at 0013. Will provide 1:1 supervision to see if will help calm patient. O2 at 2L/M via [place]. PRN xanax, melatonin, and vicodin given at 2009 with minimal relief. Alfredo Martinez A

## 2020-10-14 NOTE — Progress Notes (Signed)
Occupational Therapy Weekly Progress Note  Patient Details  Name: Brent [name] MRN: [id number] Date of Birth: September 24, 1965  Beginning of progress report period: October 04, 2020 End of progress report period: October 14, 2020  Today's Date: 10/14/2020 OT Individual Time: 1045-1200 OT Individual Time Calculation (min): 75 min    Patient has met 3 of 4 short term goals.  Pt needs significant cues to participate as he tends to cry, get anxious, highly distractable but with cues he can participate and actually has some good active movement of his LUE.    Patient continues to demonstrate the following deficits: muscle weakness and muscle joint tightness, decreased cardiorespiratoy endurance, abnormal tone, unbalanced muscle activation, motor apraxia, and decreased coordination, decreased initiation, decreased attention, decreased awareness, decreased problem solving, decreased safety awareness, decreased memory, and delayed processing, and decreased sitting balance, decreased standing balance, decreased postural control, hemiplegia, and decreased balance strategies and therefore will continue to benefit from skilled OT intervention to enhance overall performance with BADL and Reduce care partner burden.  Patient progressing toward long term goals..  Continue plan of care.  OT Short Term Goals Week 1:  OT Short Term Goal 1 (Week 1): Pt will maintain sustained attention to basic selfcare/grooming tasks with no more than mod instructional cueing for re-direction. OT Short Term Goal 1 - Progress (Week 1): Met OT Short Term Goal 2 (Week 1): Pt will maintain static sitting balance EOB with supervision for 4 mins in preparation for selfcare tasks. OT Short Term Goal 2 - Progress (Week 1): Not met OT Short Term Goal 3 (Week 1): Pt will complete UB bathing with min assist for two consecutive sessions. OT Short Term Goal 3 - Progress (Week 1): Not met OT Short Term Goal 4 (Week 1): Pt will complete sit  to stand with total +2 (pt 30%) during LB bathing or dressing. OT Short Term Goal 4 - Progress (Week 1): Met Week 2:  OT Short Term Goal 1 (Week 2): Pt will maintain static sitting balance EOB with supervision for 4 mins in preparation for selfcare tasks. OT Short Term Goal 1 - Progress (Week 2): Met OT Short Term Goal 2 (Week 2): Pt will complete UB bathing with min assist for two consecutive sessions. OT Short Term Goal 2 - Progress (Week 2): Met OT Short Term Goal 3 (Week 2): Pt will demonstrate selective attention in minimally distracting environment x 5 min following overt verbal cue in familiar task. OT Short Term Goal 3 - Progress (Week 2): Met OT Short Term Goal 4 (Week 2): Patient will stand with mod assist in preparation for LB dressing. OT Short Term Goal 4 - Progress (Week 2): Progressing toward goal Week 3:  OT Short Term Goal 1 (Week 3): Pt will don shirt with min A and min cues actively focusing on his LUE. OT Short Term Goal 2 (Week 3): Pt will consistently sit to stand with mod A to prep for LB self care.  Skilled Therapeutic Interventions/Progress Updates:    Pt received in bed with his dtr present washing his hands. Pt agreeable to a sponge bath but then he suddenly said, "I am sh...ing".  pt had large amount of loose bowel that leaked out of brief all over bed.  NT arrived to help.  Had pt roll to R side with mod cues to actively bring L arm across body,  guided pt and he was actually able to push up to sit with CGA. - sat EOB with  close S for 10 min as pt set up with stedy lift.  Mod A to stand from bed. Due to bowel movement all over LB, pt moved into shower in stedy. Had pt face wall and he used B hands to pull up on bar in standing himself from stedy pads.  Pt would constantly try to sit down. Cued him to count to 60 out loud in standing. He did and held himself upright as the counting was a good distraction.   Showered in stedy and then transferred to bed to dress. Lots of  cues and redirection with donning tshirt. Opted to leave pants off for now (only brief) in case of another bowel accident.    In bed positoned in chair position, worked on LUE a/arom with use of powder board and active finger flex/ext.    Pt resting in bed with all needs met and dtr in room with patient.    Therapy Documentation Precautions:  Precautions Precautions: Fall, Other (comment) Precaution Comments: supplemental O2 via [place], L hemi, impaired safety awareness, impulsive, NPO Restrictions Weight Bearing Restrictions: No  Vital Signs: Oxygen Therapy O2 Device: Nasal Cannula O2 Flow Rate (L/min): 2 L/min Pain: Pain Assessment Pain Scale: 0-10 Pain Score: 0-No pain ADL: ADL Eating: NPO Grooming: Minimal assistance Where Assessed-Grooming: Bed level Upper Body Bathing: Minimal assistance Where Assessed-Upper Body Bathing: Shower Lower Body Bathing: Maximal assistance Where Assessed-Lower Body Bathing: Shower Upper Body Dressing: Minimal assistance, Maximal cueing Where Assessed-Upper Body Dressing: Edge of bed Lower Body Dressing: Maximal assistance Where Assessed-Lower Body Dressing: Bed level Toileting: Dependent Where Assessed-Toileting: Bed level Toilet Transfer: Dependent Toilet Transfer Method: Engineer, water: Drop arm bedside commode Tub/Shower Transfer: Not assessed Social research officer, government: Not assessed  Therapy/Group: Individual Therapy  [place] 10/14/2020, 1:08 PM

## 2020-10-14 NOTE — Progress Notes (Signed)
Palliative Medicine Inpatient Follow Up Note  Consulting Provider: Izora Ribas, MD   Reason for consult:   Brent [name] Palliative Medicine Consult  Reason for Consult? patient expresses that he wants to die    HPI:  Per intake H&P --> 56 year old male with past medical history of of COPD, diabetes mellitus type 2, hyperlipidemia, hypertension, obstructive sleep apnea, gastroesophageal reflux disease, cocaine abuse with recent hospitalization for acute stroke now currently a patient in the inpatient rehabilitation unit.   Brent [name] was seen by the Palliative care team 7/28 - 8/5 as in inpatient. His goals at that time were clear for full scope of treatment and to ideally improve.    Palliative care has been asked to re-consult as patient has made some passive statements about "wanting to die."  Today's Discussion (10/14/2020):  *Please note that this is a verbal dictation therefore any spelling or grammatical errors are due to the "[place] [employer]" system interpretation.  Chart reviewed. Brent [name] remains to have continued agitation episodes and his anxiety is still at high significance despite present interventions.   I met at bedside with patients daughter, Brent [name]. A review of the overnight events was summarized. Brent [name] tells me "don't put restraints on me, I'll never pull the tube out again." I shared that he has dislodged his gastric tube multiple times and he is unable to remember not to do this. We reviewed that if the primary team see's a need for wrist restraints there is often a patient safety reason behind this.   Patients daughter Brent [name] and I reviewed our conversation from yesterday.  We discussed that her prior strokes Brent [name] was able to "bounce back" much more quickly from a physical and mental perspective.  We reviewed the devastation that this most recent stroke has had a psychological state.We discussed that we do not know again, where he may  end up in the future with consideration of physical and/or mental functionality.   Brent [name] and I reviewed the conversation again of "code status". I shared the reasons why DNR would be recommended in Brent [name]'s case as the goal of aggressive efforts are to preserve patients life but more so their quality of life as best as possible. We reviewed that with Brent [name] co-morbidities his situation after the traumatic efforts made during CPR would cause his body great devastation with little to no benefit. She shares that she spoke to her family and they all are in agreement with DNAR/DNI. Brent [name] as of present cannot make these decisions for himself as he does not understand the consequences of the proposed invention of CPR.I educated Brent [name] that we will made his code status DNR though none of our other treatment modalities will change.  We reviewed the severity of Brent [name]'s anxiety and how "nothing seems to be helping." I shared that I would alert the primary medical team to further verify if it may be time to involve psychiatry.   Questions and concerns answered.  Objective Assessment: Vital Signs Vitals:   10/13/20 2052 10/14/20 0423  BP:  134/83  Pulse:  84  Resp:  20  Temp:  98.7 F (37.1 C)  SpO2: 96% 98%   No intake or output data in the 24 hours ending 10/14/20 1100  Last Weight  Most recent update: 10/01/2020  4:17 AM    Weight  81.4 kg (179 lb 7.3 oz)            Gen:  Middle aged caucasian M in NAD HEENT: moist  mucous membranes CV: Regular rate and rhythm  PULM: On 2LPM [place] ABD: soft/nontender  EXT: No edema  Neuro: Alert and oriented x2  SUMMARY OF RECOMMENDATIONS   DNAR/DNI  Gold DNR placed on the chart  On discharge patients first choice is the [facility] - I have let the case manager know this  Anxiety management per primary team - I have requested that psychiatry get involved through secure chat with Dr. Otho Ket - OP Palliative support has been requested  through the CM team   Ongoing support by PMT team  Time Spent: 60 Greater than 50% of the time was spent in counseling and coordination of care ______________________________________________________________________________________ [place] Team Team Cell Phone: [phone] Please utilize secure chat with additional questions, if there is no response within 30 minutes please call the above phone number  Palliative Medicine Team providers are available by phone from 7am to 7pm daily and can be reached through the team cell phone.  Should this patient require assistance outside of these hours, please call the patient's attending physician.

## 2020-10-14 NOTE — Progress Notes (Signed)
Physical Therapy Session Note  Patient Details  Name: Brent [name] MRN: [id number] Date of Birth: April 10, 1965  Today's Date: 10/14/2020 PT Individual Time: 4098-1191 PT Individual Time Calculation (min): 55 min   Short Term Goals: Week 2:  PT Short Term Goal 1 (Week 2): Pt will tolerate upright, OOB sitting for at least 1 hour between therapy sessions. PT Short Term Goal 2 (Week 2): Pt will demonstrate improved sitting balance with ability to maintain static sitting for at least 2 minutes without UE support PT Short Term Goal 3 (Week 2): Pt will perform chair <>bed transfers w/max A x1  Skilled Therapeutic Interventions/Progress Updates:  Patient supine in bed on entrance to room with wrist restraints applied. Patient alert and agreeable to PT session. Patient with no new pain complaints during session. On entrance to room, pt relates need for brief change.   Therapeutic Activity: Bed Mobility: Patient performed roll to L side with supervision and roll to R side requiring vc/ tc for technique while relating that he had a stroke and his L side doesn't "work good so he needs help".  With Mod A, pt is able to complete roll to R side.After pericare and brief change, pt relates again that he needs help to reach seated position on EOB to R side. After vc/ tc for roll to R side with Mod A, pt is able to push self up to seated position with CGA. Maintains seated balance throughout Max A donning of shoes with Min A from +2.  Transfers: Patient performed STS transfers using STEDY throughout session with Max A from lower heights and CGA from STEDY perch. Performs 6x throughout session. At end of session, pt adamant that he wants to call wife to relate that he was "able to get to his w/c without help 4x". Dialed phone and provided topt, however wife did not answer. Pt perseverates on wanting to call wife to relate and wants to redial each minute. Convinced pt to wait until NT can return to room to assist  in order to give wife time to get to a point where she could talk on the phone with him since she is probably at work. Takes max convincing.   Neuromuscular Re-ed: NMR facilitated during session with focus on forward reaching, standing balance and midline orientation, standing tolerance. Pt relates desire to play checkers and brought to therapy gym with STEDY. Used STEDY to assist with stance, vc to find midline, lean forward to reach pieces. Guided in all aspects in order to promote balance. NMR performed for improvements in motor control and coordination, balance, sequencing, judgement, and self confidence/ efficacy in performing all aspects of mobility at highest level of independence.   Patient supine  in bed at end of session with brakes locked, bed alarm set, wrist restraints applied, and all needs within reach. NT informed as to pt's position and wishes to call wife again.      Therapy Documentation Precautions:  Precautions Precautions: Fall, Other (comment) Precaution Comments: supplemental O2 via [place], L hemi, impaired safety awareness, impulsive, NPO Restrictions Weight Bearing Restrictions: No  Therapy/Group: Individual Therapy  Loel Dubonnet PT, DPT 10/14/2020, 7:04 PM

## 2020-10-15 DIAGNOSIS — D72823 Leukemoid reaction: Secondary | ICD-10-CM

## 2020-10-15 DIAGNOSIS — F4323 Adjustment disorder with mixed anxiety and depressed mood: Secondary | ICD-10-CM

## 2020-10-15 DIAGNOSIS — F322 Major depressive disorder, single episode, severe without psychotic features: Secondary | ICD-10-CM

## 2020-10-15 DIAGNOSIS — R5381 Other malaise: Secondary | ICD-10-CM

## 2020-10-15 LAB — GLUCOSE, CAPILLARY
Glucose-Capillary: 108 mg/dL — ABNORMAL HIGH (ref 70–99)
Glucose-Capillary: 116 mg/dL — ABNORMAL HIGH (ref 70–99)
Glucose-Capillary: 120 mg/dL — ABNORMAL HIGH (ref 70–99)
Glucose-Capillary: 129 mg/dL — ABNORMAL HIGH (ref 70–99)
Glucose-Capillary: 140 mg/dL — ABNORMAL HIGH (ref 70–99)
Glucose-Capillary: 186 mg/dL — ABNORMAL HIGH (ref 70–99)

## 2020-10-15 MED ORDER — ESCITALOPRAM OXALATE 10 MG PO TABS
10.0000 mg | ORAL_TABLET | Freq: Every day | ORAL | Status: DC
  Administered 2020-10-16 – 2020-10-18 (×3): 10 mg
  Filled 2020-10-15 (×3): qty 1

## 2020-10-15 MED ORDER — QUETIAPINE FUMARATE 50 MG PO TABS
100.0000 mg | ORAL_TABLET | Freq: Every day | ORAL | Status: DC
  Administered 2020-10-15 – 2020-10-16 (×3): 100 mg
  Filled 2020-10-15 (×3): qty 2

## 2020-10-15 NOTE — Progress Notes (Signed)
[facility] PHYSICAL MEDICINE & REHABILITATION PROGRESS NOTE  Subjective/Complaints: Pt MUCH improved this morning. In good spirits. [place] Spaniel he wanted to live for family and wants to participate in rehab. SLP at bedside. Promised not to pull at his PEG if we took off restraints  ROS: Limited due to cognitive/behavioral    Objective: Vital Signs: Blood pressure (!) 143/55, pulse 76, temperature 98.5 F (36.9 C), temperature source Oral, resp. rate 17, height 6' (1.829 m), weight 81.4 kg, SpO2 97 %. DG Abd Portable 1V  Result Date: 10/13/2020 CLINICAL DATA:  Feeding tube placement. EXAM: PORTABLE ABDOMEN - 1 VIEW COMPARISON:  October 01, 2020. FINDINGS: The bowel gas pattern is normal. Gastrostomy tube tip appears to be well position within gastric lumen. Contrast was injected which is seen filling gastric lumen. No extravasation or leakage is noted. No radio-opaque calculi or other significant radiographic abnormality are seen. IMPRESSION: Gastrostomy tube tip in grossly good position. No abnormal bowel dilatation. Electronically Signed   By: Lupita Raider M.D.   On: 10/13/2020 16:22   Recent Labs    10/14/20 0636  WBC 10.8*  HGB 9.8*  HCT 29.6*  PLT 287    Recent Labs    10/14/20 0636  NA 138  K 4.0  CL 104  CO2 27  GLUCOSE 134*  BUN 18  CREATININE 0.87  CALCIUM 9.6     Intake/Output Summary (Last 24 hours) at 10/15/2020 1154 Last data filed at 10/15/2020 0700 Gross per 24 hour  Intake 0 ml  Output --  Net 0 ml           Physical Exam: BP (!) 143/55 (BP Location: Left Arm)   Pulse 76   Temp 98.5 F (36.9 C) (Oral)   Resp 17   Ht 6' (1.829 m)   Wt 81.4 kg   SpO2 97%   BMI 24.34 kg/m      Constitutional: No distress . Vital signs reviewed. HEENT: NCAT, EOMI, oral membranes moist Neck: supple Cardiovascular: RRR without murmur. No JVD    Respiratory/Chest: CTA Bilaterally without wheezes or rales. Normal effort    GI/Abdomen: BS +, non-tender,  non-distended. PEG site clean sl redness, abd binder in place Ext: no clubbing, cyanosis, or edema Psych: pleasant and very cooperative. In good spirits    Musc: No edema in extremities.  No tenderness in extremities. Neuro: oriented x 3. Distracted, follows basic commands Dysarthria, unchanged LUE: Shoulder abduction, elbow flexion/extension 3/5, handgrip 3/5, improving LLE: 3-/5 proximal distal  Assessment/Plan: 1. Functional deficits which require 3+ hours per day of interdisciplinary therapy in a comprehensive inpatient rehab setting. Physiatrist is providing close team supervision and 24 hour management of active medical problems listed below. Physiatrist and rehab team continue to assess barriers to discharge/monitor patient progress toward functional and medical goals   Care Tool:  Bathing    Body parts bathed by patient: Abdomen, Chest, Face, Right arm, Left arm   Body parts bathed by helper:  (pt declined bathing all other body parts) Body parts n/a: Front perineal area, Buttocks   Bathing assist Assist Level: Moderate Assistance - Patient 50 - 74%     Upper Body Dressing/Undressing Upper body dressing   What is the patient wearing?: Pull over shirt    Upper body assist Assist Level: Moderate Assistance - Patient 50 - 74%    Lower Body Dressing/Undressing Lower body dressing      What is the patient wearing?: Pants, Underwear/pull up     Lower  body assist Assist for lower body dressing: Maximal Assistance - Patient 25 - 49%     Toileting Toileting Toileting Activity did not occur (Clothing management and hygiene only): N/A (no void or bm)  Toileting assist Assist for toileting: 2 Helpers     Transfers Chair/bed transfer  Transfers assist  Chair/bed transfer activity did not occur: Safety/medical concerns  Chair/bed transfer assist level: Maximal Assistance - Patient 25 - 49%     Locomotion Ambulation   Ambulation assist   Ambulation activity  did not occur: Safety/medical concerns          Walk 10 feet activity   Assist  Walk 10 feet activity did not occur: Safety/medical concerns        Walk 50 feet activity   Assist Walk 50 feet with 2 turns activity did not occur: Safety/medical concerns         Walk 150 feet activity   Assist Walk 150 feet activity did not occur: Safety/medical concerns         Walk 10 feet on uneven surface  activity   Assist Walk 10 feet on uneven surfaces activity did not occur: Safety/medical concerns         Wheelchair     Assist Is the patient using a wheelchair?: Yes Type of Wheelchair: Manual Wheelchair activity did not occur: Safety/medical concerns         Wheelchair 50 feet with 2 turns activity    Assist    Wheelchair 50 feet with 2 turns activity did not occur: Safety/medical concerns       Wheelchair 150 feet activity     Assist  Wheelchair 150 feet activity did not occur: Safety/medical concerns        Medical Problem List and Plan: 1.   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 Hx of prior stroke 6/22.  D/c to SNF when bed available.    Pt is DNR  -appreciate palliative care assistance! 2.  Impaired mobility: Discontinue Lovenox given bleeding from PEG site.              -antiplatelet therapy: Has completed 3 weeks of Plavix, continue Aspirin 81mg  daily.  3. Low back pain: d/c IV Dilaudid. Discontinue Norco. Add lidocaine patch for lower back.   8/30- denies pain at present- con't regimen 4. Anxiety: Continue Xanax as needed. Increased seroquel to 25mg  HS. Psych consulted as per mother's request. Can consult chaplain at night as well if patient is agitated- they have 247 service  8/27- they consulted chaplain last night- didn't help  to keep off restraints/wrist- last night- cannot stop pulling at lines/PEG/O2  8/29- continued suicidal ideations 8/30- psych saw pt today and made further adjustments  - increased seroquel, stopped abilify, increased lexapro -will speak with md about those changes as abilify seemed to make a big difference 5. Neuropsych: This patient is not capable of making decisions on his own behalf.   9/29 for safety, continue.   8/30 continue restraints. remove when family here 6. Skin/Wound Care: Routine skin checks 7. Fluids/Electrolytes/Nutrition: Routine in and outs   I personally reviewed the patient's labs today.   8.  Post stroke dysphagia.  Status postgastrostomy tube 09/24/2020 per interventional radiology.-Continue routine PEG care  -SLP follow up, continue to advance diet as tolerated  8/30 peg in normal position  9. Cocaine/tobacco abuse.UDS positive cocaine.  Counseled on appropriate 10.  Diabetes mellitus.  Hemoglobin A1c 6.4.   Diabetic teaching when appropriate  Levemir decreased to 8 on 8/17  Discontinue Novolog- no more hypoglycemic episodes after this CBG (last 3)  Recent Labs    10/15/20 0004 10/15/20 0418 10/15/20 0756  GLUCAP 186* 108* 116*  Increase Levemir to 6U  8/30 fair control- con't regimen    11.  Hyperlipidemia.  Crestor/fenofibrate 12.Aspiration pneumonia.  Completed 14 days antibiotic 13.  Constipation.  MiraLAX 14.  Acute blood loss anemia  Hemoglobin 7.6 on 8/17, up to 7.8 on 8/18, plavix resumed as per GI recommendations. Continue PPI indefinitely. Repeat CBC periodically.   Endoscopy shows no ulcers  8/29- hgb up to 9.8 15.  Leukocytosis  CBC stable, remains afebrile  16.  AKI: Resolved  Creatinine 0.81 on 8/16  IVF initiated, IVF DC'd 17. Hypernatremia: Sodium 139 on 8/16, improved 18. HTN: started propanolol 10mg  daily.   Vitals:   10/15/20 0841 10/15/20 0951  BP:    Pulse: 76   Resp: 17   Temp:    SpO2: 95% 97%    8/30 fair   control          LOS: 18 days A FACE TO FACE EVALUATION WAS PERFORMED  9/30 10/15/2020, 11:54 AM

## 2020-10-15 NOTE — Progress Notes (Signed)
Patient ID: Brent [name], male   DOB: [birth year], 55 y.o.   MRN: [id number]  Spoke with Mom via telephone to inform team conference and update on insurance denial and a bed offer via [facility]. She is pleased he will be close to home and more people, can visit him. Will let know when approved and plan to move him. Met with pt who is in agreement with this plan.

## 2020-10-15 NOTE — Consult Note (Signed)
[facility] Face-to-Face Psychiatry Consult   Reason for Consult: Severe depression with anxiety, suicidal ideation Referring Physician:   Faith Rogue MD Patient Identification: Brent [name] MRN:  [id number] Principal Diagnosis: Acute blood loss anemia Diagnosis:  Principal Problem:   Acute blood loss anemia Active Problems:   Uncontrolled type 2 diabetes mellitus, with long-term current use of insulin (HCC)   Mixed diabetic hyperlipidemia associated with type 2 diabetes mellitus (HCC)   Essential hypertension   Acute stroke due to ischemia (HCC)   Labile blood glucose   Leukocytosis   S/P percutaneous endoscopic gastrostomy (PEG) tube placement (HCC)   Malnutrition of moderate degree   PEG (percutaneous endoscopic gastrostomy) status (HCC)   Total Time spent with patient: 30 minutes  Subjective:   Brent [name] is a 55 y.o. male patient with past medical history significant for COPD, DM2, cocaine abuse, HLD, hypertension, OSA, GERD admitted with acute stroke currently in [place].  Psych consulted for severe depression with anxiety and suicidal ideations.  Patient is seen and examined today.  Patient states he has been feeling depressed because he just want to go home.  He states" I just want to get the hell out of here". He states he misses his family.  Explained that the primary team is looking for SNF placement and he will likely not go to home.  Patient verbalizes understanding. Patient states he has been feeling depressed for last 5 months which has been worsened since he has been here. Patient reports hopelessness and feeling agitated especially at night.  Patient denies changes in sleep or appetite, denies anhedonia, fatigue, low energy, helplessness, worthlessness, problems with concentration and memory.  Patient denies high energy manic type episodes with racing thoughts, decreased need for sleep, and irritability.  Patient states he feels agitated at night.  Patient denies  anxiety. Currently, patient denies active or passive suicidal ideations, and homicidal ideation. He denies auditory and visual hallucinations.  Patient denies paranoia. Patient states he has tried some medication for depression 4-5 months ago but do not remember its name.  He states "they did not work".  Patient denies past psychiatric hospitalization or diagnosis.  Patient denies past suicidal attempt. Patient denies any family history of mental illness.  Patient states he used cocaine occasionally (last use 5-6 months ago). He denies using any other illicit drug. He denies drinking alcohol. Patient worked as a Designer, fashion/clothing, retired couple months ago.  Patient [place] in the [place].  Patient has 1 daughter(37 year old) and 34 son 107 year old).  Patient has 5 grandchildren. On examination, Patient is partially oriented.  Patient's speech is slurred.  He is oriented to place and person, not oriented to time.  Patient states it is April, 2021.  Knows current and past president.  His mood is depressed, and affect is dysphoric and anxious.  Denies SI, HI, AVH.  Denies paranoia. HPI:  Brent [name] is a 55 y.o. male patient with past medical history significant for COPD, DM2, cocaine abuse, HLD, hypertension, OSA, GERD admitted with acute stroke currently in [facility] [place].  Psych consulted for severe depression with with anxiety and suicidal ideations   Past Psychiatric History:   Risk to Self:   Risk to Others:   Prior Inpatient Therapy:   Prior Outpatient Therapy:    Past Medical History:  Past Medical History:  Diagnosis Date   Aplastic anemia, unspecified (HCC)    Chest pain, unspecified    Chronic airway obstruction, not elsewhere classified  Cocaine substance abuse (HCC)    Neuropathy    Other and unspecified hyperlipidemia    Shortness of breath    Stroke (HCC) 06/17/2020   Tobacco use disorder    Type II or unspecified type diabetes mellitus without mention of  complication, not stated as uncontrolled    Unspecified epilepsy without mention of intractable epilepsy    Unspecified essential hypertension     Past Surgical History:  Procedure Laterality Date   BIOPSY  10/02/2020   Procedure: BIOPSY;  Surgeon: Jenel Lucksunningham, Scott E, MD;  [facility];  Service: Gastroenterology;;   CARDIAC CATHETERIZATION  10/02/2010    Nonobstructive mild coronary plaque   ESOPHAGOGASTRODUODENOSCOPY N/A 10/02/2020   Procedure: ESOPHAGOGASTRODUODENOSCOPY (EGD);  Surgeon: Jenel Lucksunningham, Scott E, MD;  [facility];  Service: Gastroenterology;  Laterality: N/A;   IR GASTROSTOMY TUBE MOD SED  09/24/2020   KNEE SURGERY Bilateral 2018   Repair   PERFORATED VISCUS SURGERY     MULTIPLE FRACTURES   Family History:  Family History  Problem Relation Age of Onset   Heart attack Father    COPD Father    Coronary artery disease Mother    Family Psychiatric  History: None per chart Social History:  Social History   Substance and Sexual Activity  Alcohol Use Yes   Comment: drinks beer on the weekends     Social History   Substance and Sexual Activity  Drug Use Yes   Types: Cocaine    Social History   Socioeconomic History   Marital status: Single    Spouse name: Not on file   Number of children: Not on file   Years of education: Not on file   Highest education level: Not on file  Occupational History   Not on file  Tobacco Use   Smoking status: Every Day    Packs/day: 1.00    Years: 30.00    Pack years: 30.00    Types: Cigarettes   Smokeless tobacco: Never  Substance and Sexual Activity   Alcohol use: Yes    Comment: drinks beer on the weekends   Drug use: Yes    Types: Cocaine   Sexual activity: Not Currently  Other Topics Concern   Not on file  Social History Narrative   Lives in [place] with wife.   Has one daughter and one grandson.   Right handed    Social Determinants of Health   Financial Resource Strain: Not on file  Food  Insecurity: Not on file  Transportation Needs: Not on file  Physical Activity: Not on file  Stress: Not on file  Social Connections: Not on file   Additional Social History:    Allergies:  No Known Allergies  Labs:  Results for orders placed or performed during the hospital encounter of 09/27/20 (from the past 48 hour(s))  Glucose, capillary     Status: Abnormal   Collection Time: 10/13/20 11:14 AM  Result Value Ref Range   Glucose-Capillary 217 (H) 70 - 99 mg/dL    Comment: Glucose reference range applies only to samples taken after fasting for at least 8 hours.  Glucose, capillary     Status: Abnormal   Collection Time: 10/13/20  4:40 PM  Result Value Ref Range   Glucose-Capillary 133 (H) 70 - 99 mg/dL    Comment: Glucose reference range applies only to samples taken after fasting for at least 8 hours.  Glucose, capillary     Status: Abnormal   Collection Time: 10/13/20  8:05  PM  Result Value Ref Range   Glucose-Capillary 200 (H) 70 - 99 mg/dL    Comment: Glucose reference range applies only to samples taken after fasting for at least 8 hours.   Comment 1 Notify RN   Glucose, capillary     Status: Abnormal   Collection Time: 10/14/20 12:00 AM  Result Value Ref Range   Glucose-Capillary 138 (H) 70 - 99 mg/dL    Comment: Glucose reference range applies only to samples taken after fasting for at least 8 hours.   Comment 1 Notify RN   Glucose, capillary     Status: None   Collection Time: 10/14/20  4:17 AM  Result Value Ref Range   Glucose-Capillary 96 70 - 99 mg/dL    Comment: Glucose reference range applies only to samples taken after fasting for at least 8 hours.   Comment 1 Notify RN   CBC with Differential/Platelet     Status: Abnormal   Collection Time: 10/14/20  6:36 AM  Result Value Ref Range   WBC 10.8 (H) 4.0 - 10.5 K/uL   RBC 3.07 (L) 4.22 - 5.81 MIL/uL   Hemoglobin 9.8 (L) 13.0 - 17.0 g/dL   HCT 16.1 (L) 09.6 - 04.5 %   MCV 96.4 80.0 - 100.0 fL   MCH 31.9  26.0 - 34.0 pg   MCHC 33.1 30.0 - 36.0 g/dL   RDW 40.9 81.1 - 91.4 %   Platelets 287 150 - 400 K/uL   nRBC 0.0 0.0 - 0.2 %   Neutrophils Relative % 77 %   Neutro Abs 8.3 (H) 1.7 - 7.7 K/uL   Lymphocytes Relative 13 %   Lymphs Abs 1.4 0.7 - 4.0 K/uL   Monocytes Relative 7 %   Monocytes Absolute 0.8 0.1 - 1.0 K/uL   Eosinophils Relative 2 %   Eosinophils Absolute 0.3 0.0 - 0.5 K/uL   Basophils Relative 1 %   Basophils Absolute 0.1 0.0 - 0.1 K/uL   Immature Granulocytes 0 %   Abs Immature Granulocytes 0.03 0.00 - 0.07 K/uL    Comment: Performed at [facility], [address], [place], [place] [address]  Comprehensive metabolic panel     Status: Abnormal   Collection Time: 10/14/20  6:36 AM  Result Value Ref Range   Sodium 138 135 - 145 mmol/L   Potassium 4.0 3.5 - 5.1 mmol/L   Chloride 104 98 - 111 mmol/L   CO2 27 22 - 32 mmol/L   Glucose, Bld 134 (H) 70 - 99 mg/dL    Comment: Glucose reference range applies only to samples taken after fasting for at least 8 hours.   BUN 18 6 - 20 mg/dL   Creatinine, Ser 6.21 0.61 - 1.24 mg/dL   Calcium 9.6 8.9 - 30.8 mg/dL   Total Protein 6.2 (L) 6.5 - 8.1 g/dL   Albumin 2.9 (L) 3.5 - 5.0 g/dL   AST 13 (L) 15 - 41 U/L   ALT 11 0 - 44 U/L   Alkaline Phosphatase 61 38 - 126 U/L   Total Bilirubin 0.3 0.3 - 1.2 mg/dL   GFR, Estimated >65 >78 mL/min    Comment: (NOTE) Calculated using the CKD-EPI Creatinine Equation (2021)    Anion gap 7 5 - 15    Comment: Performed at [facility], [address], [place], [place] [address]  Glucose, capillary     Status: Abnormal   Collection Time: 10/14/20 11:46 AM  Result Value Ref Range  Glucose-Capillary 171 (H) 70 - 99 mg/dL    Comment: Glucose reference range applies only to samples taken after fasting for at least 8 hours.  Glucose, capillary     Status: Abnormal   Collection Time: 10/14/20  4:50 PM  Result Value Ref Range   Glucose-Capillary 102 (H) 70 - 99 mg/dL    Comment: Glucose  reference range applies only to samples taken after fasting for at least 8 hours.  Glucose, capillary     Status: Abnormal   Collection Time: 10/14/20  8:09 PM  Result Value Ref Range   Glucose-Capillary 187 (H) 70 - 99 mg/dL    Comment: Glucose reference range applies only to samples taken after fasting for at least 8 hours.  Glucose, capillary     Status: Abnormal   Collection Time: 10/15/20 12:04 AM  Result Value Ref Range   Glucose-Capillary 186 (H) 70 - 99 mg/dL    Comment: Glucose reference range applies only to samples taken after fasting for at least 8 hours.  Glucose, capillary     Status: Abnormal   Collection Time: 10/15/20  4:18 AM  Result Value Ref Range   Glucose-Capillary 108 (H) 70 - 99 mg/dL    Comment: Glucose reference range applies only to samples taken after fasting for at least 8 hours.  Glucose, capillary     Status: Abnormal   Collection Time: 10/15/20  7:56 AM  Result Value Ref Range   Glucose-Capillary 116 (H) 70 - 99 mg/dL    Comment: Glucose reference range applies only to samples taken after fasting for at least 8 hours.    Current Facility-Administered Medications  Medication Dose Route Frequency Provider Last Rate Last Admin   acetaminophen (TYLENOL) 160 MG/5ML solution 650 mg  650 mg Per Tube Q4H PRN Jenel Lucks, MD   650 mg at 10/14/20 2124   ALPRAZolam (XANAX) tablet 0.25 mg  0.25 mg Per Tube TID PRN Jenel Lucks, MD   0.25 mg at 10/14/20 2125   arformoterol (BROVANA) nebulizer solution 15 mcg  15 mcg Nebulization BID Jenel Lucks, MD   15 mcg at 10/15/20 0841   ARIPiprazole (ABILIFY) tablet 5 mg  5 mg Per Tube Daily Faith Rogue T, MD   5 mg at 10/15/20 0751   budesonide (PULMICORT) nebulizer solution 0.5 mg  0.5 mg Nebulization BID Tiajuana Amass E, MD   0.5 mg at 10/15/20 0841   carBAMazepine (TEGRETOL) 100 MG/5ML suspension 200 mg  200 mg Per Tube Q6H Tiajuana Amass E, MD   200 mg at 10/15/20 0750   escitalopram  (LEXAPRO) tablet 5 mg  5 mg Per Tube Daily Raulkar, Drema Pry, MD   5 mg at 10/15/20 0749   feeding supplement (OSMOLITE 1.5 CAL) liquid 355 mL  355 mL Per Tube QID Faith Rogue T, MD 0 mL/hr at 10/14/20 0806 355 mL at 10/15/20 0751   feeding supplement (PROSource TF) liquid 45 mL  45 mL Per Tube BID Ranelle Oyster, MD   45 mL at 10/15/20 0751   fenofibrate tablet 160 mg  160 mg Per Tube Daily Tiajuana Amass E, MD   160 mg at 10/15/20 0749   free water 230 mL  230 mL Per Tube QID Tiajuana Amass E, MD   230 mL at 10/15/20 0752   guaiFENesin (ROBITUSSIN) 100 MG/5ML solution 200 mg  10 mL Per Tube Q4H Jenel Lucks, MD   200 mg at 10/15/20 0750   HYDROcodone-acetaminophen (NORCO/VICODIN) 5-325  MG per tablet 1 tablet  1 tablet Per Tube Daily PRN Raulkar, Drema Pry, MD   1 tablet at 10/15/20 0007   insulin aspart (novoLOG) injection 0-20 Units  0-20 Units Subcutaneous Q4H Jenel Lucks, MD   4 Units at 10/15/20 0007   insulin detemir (LEVEMIR) injection 6 Units  6 Units Subcutaneous BID Raulkar, Drema Pry, MD   6 Units at 10/15/20 0750   ipratropium-albuterol (DUONEB) 0.5-2.5 (3) MG/3ML nebulizer solution 3 mL  3 mL Nebulization Q4H PRN Jenel Lucks, MD   3 mL at 10/14/20 0403   lidocaine (LIDODERM) 5 % 1 patch  1 patch Transdermal Q24H Raulkar, Drema Pry, MD   1 patch at 10/14/20 1400   melatonin tablet 3 mg  3 mg Per Tube QHS PRN Jenel Lucks, MD   3 mg at 10/14/20 2124   nitroGLYCERIN (NITROSTAT) SL tablet 0.4 mg  0.4 mg Sublingual Q5 min PRN Jenel Lucks, MD       pantoprazole sodium (PROTONIX) 40 mg oral suspension 40 mg  40 mg Per Tube BID Charlton Amor, PA-C   40 mg at 10/15/20 0753   polyethylene glycol (MIRALAX / GLYCOLAX) packet 17 g  17 g Per Tube Daily Jenel Lucks, MD   17 g at 10/15/20 0750   propranolol (INDERAL) tablet 10 mg  10 mg Per Tube Daily Ranelle Oyster, MD   10 mg at 10/15/20 1610   QUEtiapine (SEROQUEL) tablet 25 mg  25  mg Per Tube QHS Jenel Lucks, MD   25 mg at 10/14/20 2129   rosuvastatin (CRESTOR) tablet 20 mg  20 mg Per Tube Daily Jenel Lucks, MD   20 mg at 10/15/20 9604    Musculoskeletal: Strength & Muscle Tone:  decreased Gait & Station:  Deferred Patient leans: Backward            Psychiatric Specialty Exam:  Presentation  General Appearance: Appropriate for Environment  Eye Contact:Fair  Speech:Clear and Coherent  Speech Volume:Normal  Handedness:Right   Mood and Affect  Mood:Depressed  Affect:Depressed   Thought Process  Thought Processes:Coherent  Descriptions of Associations:Intact  Orientation:Partial (Oriented to place, person and self but not oriented to time. Thinks that its April, 2021)  Thought Content:Logical  History of Schizophrenia/Schizoaffective disorder:No data recorded Duration of Psychotic Symptoms:No data recorded Hallucinations:Hallucinations: None  Ideas of Reference:None  Suicidal Thoughts:Suicidal Thoughts: No  Homicidal Thoughts:Homicidal Thoughts: No   Sensorium  Memory:Immediate Fair; Remote Poor; Recent Fair  Judgment:Fair  Insight:Fair   Executive Functions  Concentration:Fair  Attention Span:Good  Recall:Fair  Fund of Knowledge:Fair  Language:Fair   Psychomotor Activity  Psychomotor Activity:Psychomotor Activity: Normal   Assets  Assets:Social Support; Resilience; Communication Skills; Desire for Improvement; Financial Resources/Insurance; Housing   Sleep  Sleep:Sleep: Good   Physical Exam: Physical Exam Vitals and nursing note reviewed.  Constitutional:      General: He is not in acute distress.    Appearance: Normal appearance. He is not ill-appearing, toxic-appearing or diaphoretic.  HENT:     Head: Normocephalic and atraumatic.  Pulmonary:     Effort: Pulmonary effort is normal.  Neurological:     General: No focal deficit present.     Mental Status: He is alert.      Comments: Partial (Oriented to place, person and self but not oriented to time. Thinks that its April, 2021)    Review of Systems  Constitutional:  Negative for chills and fever.  Eyes:  Negative for blurred vision.  Respiratory:  Negative for shortness of breath.   Cardiovascular:  Negative for chest pain.  Gastrointestinal:  Negative for nausea and vomiting.  Neurological:  Negative for dizziness and headaches.  Psychiatric/Behavioral:  Positive for depression and substance abuse. Negative for hallucinations and suicidal ideas. The patient is not nervous/anxious and does not have insomnia.   Blood pressure (!) 143/55, pulse 76, temperature 98.5 F (36.9 C), temperature source Oral, resp. rate 17, height 6' (1.829 m), weight 81.4 kg, SpO2 95 %. Body mass index is 24.34 kg/m.  Treatment Plan Summary:Azariel C Kabler is a 55 y.o. male patient with past medical history significant for COPD, DM2, cocaine abuse, HLD, hypertension, OSA, GERD admitted with acute stroke currently in [facility] [place].  Psych consulted for severe depression with with anxiety and suicidal ideations.  Patient is currently in rehab, will likely go to SNF at discharge. Plan -Patient does not meet criteria for inpatient hospitalization.  Patient is not suicidal, homicidal, psychotic. - Increase Seroquel to 100 nightly. - Continue Melatonin 3 mg nightly - Increase Lexapro to 10 mg daily -Stop Abilify  - Continue Propanolol 10 mg daily - Continue Xanax 0.25 mg 3 times daily as needed. -Discontinue Sitter Disposition: No evidence of imminent risk to self or others at present.   Patient does not meet criteria for psychiatric inpatient admission. Supportive therapy provided about ongoing stressors. Discussed crisis plan, support from social network, calling 911, coming to the Emergency Department, and calling Suicide Hotline.  Karsten Ro, MD 10/15/2020 9:16 AM

## 2020-10-15 NOTE — Progress Notes (Signed)
Occupational Therapy Session Note  Patient Details  Name: Brent [name] MRN: [id number] Date of Birth: 06/22/1965  Today's Date: 10/15/2020 OT Individual Time: 1005-1105 OT Individual Time Calculation (min): 60 min    Short Term Goals: Week 3:  OT Short Term Goal 1 (Week 3): Pt will don shirt with min A and min cues actively focusing on his LUE. OT Short Term Goal 2 (Week 3): Pt will consistently sit to stand with mod A to prep for LB self care.  Skilled Therapeutic Interventions/Progress Updates:    Pt semi reclined in bed, hollering out to rehab tech stating "I love you" repeatedly.  C/o 6/10 pain in lower back and requesting pain medication.  Nurse made aware.  Pt required max frequent and simple multimodal instructional cues to keep on task throughout session due to significant distractibility internally and externally.  Pt also required max encouragement to facilitate participation due to pt repetitively saying "I cant, you do it for me" with little effort provided and performance fluctuating greatly within the session. Mod assist supine to sit using bed rail.  Donned socks with max assist in figure 4 position (to maintain balance and correclty weight shift) as well as thread sock over foot with step by step multimodal cues to educate pt on hemi technique while donning.   Total assist +2 squat pivot transfer EOB to w/c.  Pt then urgently requesting to have a bowel movement.  Sit <> stand stedy transfer with max assist +2.  Pt had continent episode of bowel and needing total assist for pericare and clothing mgt sit<>stand level at stedy.  Pt returned to bedroom and demanding to put pants on now.  Total assist to donn pants and pull over hips using stedy.  Call bell in reach, telesitter on, seat belt alarm on, wrist restraints applied and left hand mitt donned at end of session.  CNA notified of pts location.  Therapy Documentation Precautions:  Precautions Precautions: Fall, Other  (comment) Precaution Comments: supplemental O2 via [place], L hemi, impaired safety awareness, impulsive, NPO Restrictions Weight Bearing Restrictions: No    Therapy/Group: Individual Therapy  Amie Critchley 10/15/2020, 2:57 PM

## 2020-10-15 NOTE — Patient Care Conference (Signed)
Inpatient RehabilitationTeam Conference and Plan of Care Update Date: 10/15/2020   Time: 11:56 AM    Patient Name: Brent [name]      Medical Record Number: [id number]  Date of Birth: Mar 28, 1965 Sex: Male         Room/Bed: 4W21C/4W21C-01 Payor Info: Payor: [place] MEDICAID PREPAID HEALTH PLAN / Plan: [place] MEDICAID [employer] [place] / Product Type: *No Product type* /    Admit Date/Time:  09/27/2020  1:13 PM  Primary Diagnosis:  Acute blood loss anemia  Hospital Problems: Principal Problem:   Acute blood loss anemia Active Problems:   Uncontrolled type 2 diabetes mellitus, with long-term current use of insulin (HCC)   Mixed diabetic hyperlipidemia associated with type 2 diabetes mellitus (HCC)   Essential hypertension   Acute stroke due to ischemia (HCC)   Labile blood glucose   Leukocytosis   S/P percutaneous endoscopic gastrostomy (PEG) tube placement (HCC)   Malnutrition of moderate degree   PEG (percutaneous endoscopic gastrostomy) status (HCC)    Expected Discharge Date: Expected Discharge Date:  (SNF pending)  Team Members Present: Physician leading conference: Dr. Faith Rogue Social Worker Present: Dossie Der, LCSW Nurse Present: Chana Bode, RN PT Present: Ernestene Kiel, PT OT Present: Earleen Newport, OT SLP Present: Feliberto Gottron, SLP PPS Coordinator present : Fae Pippin, SLP     Current Status/Progress Goal Weekly Team Focus  Bowel/Bladder             Swallow/Nutrition/ Hydration   NPO with ongoing s/s with trials of Dys. 1 textures with SLP  Min A  trials with SLP only, education, pharyngeal strengthening exercises   ADL's   mod bathing/UB ADLs, spvsn/cues grooming, Max A/+2 LB ADLs, Mod-Max/+2 bed mobility, sit<>stands, and squat pivot transfers or using Stedy; max cues for recall/redirection, impulsive and poor safety awareness  set up-min A UB ADLs; mod A LB ADLs sit<>stand level; transfers min-mod A  ADL retraining, trunk  control/sitting balance, sit<>stand and functional transfers, safety awareness, fall prevention, functional cog, NMR   Mobility   Min-max A bed mobility, max A x1 transfers  Min A  Transfers, safety awareness, L inattention, appropritate behavior, dynamic sitting balance   Communication             Safety/Cognition/ Behavioral Observations  Mod-Max A  sup for orientation, min A for problem solving, memory, attention, and awareness  orientation, sustained attention, functional problem solving, family education   Pain             Skin               Discharge Planning:  Pt is currently NHP-insurance has denied his fiurther stay here due to lack of progress and doesn;t feel medically necessary to be here. Currently no bed offers due to insurance and drug history   Team Discussion: Behavioral changes noted 10/14/20; psychology consulted and medications adjusted. Patient appears better today, not as labile.  Remains incontinent of bladder and continent of bowel despite timed toileting. Continues to pull at tubes and fiddle with PEG, abd binder on for safety.   Patient on target to meet rehab goals: Requires max assist for transfers. Goals for discharge set for min assist overall.  *See Care Plan and progress notes for long and short-term goals.   Revisions to Treatment Plan:  D1 pudding thick liquid trials at bedside  Working on dynamic sitting balance  Teaching Needs: Nutritional means, medication administration via PEG, secondary stroke risk management, safety, toileting,  etc  Current Barriers to Discharge: Home enviroment access/layout, Incontinence, Lack of/limited family support, Medication compliance, Behavior, and Nutritional means  Possible Resolutions to Barriers: SNF recommended     Medical Summary               I attest that I was present, lead the team conference, and concur with the assessment and plan of the team.   Chana Bode B 10/15/2020, 2:59 PM

## 2020-10-15 NOTE — Progress Notes (Signed)
Patient ID: Brent [name], male   DOB: October 28, 1965, 55 y.o.   MRN: [id number] Spoke with Debbie [facility]-admissions  coordinator who has offered a  bed for pt, due to his Mom worked there for years. Will update pt and Mom and Eunice Blase will work on authorization may take a few days.

## 2020-10-15 NOTE — Progress Notes (Signed)
Physical Therapy Weekly Progress Note  Patient Details  Name: Brent [name] MRN: [id number] Date of Birth: 1965-09-04  Beginning of progress report period: September 28, 2020 End of progress report period: October 15, 2020   Patient has met 0 of 3 short term goals.  Pt's cognition is fluctuating and he fatigues very quickly, resulting in varying levels of assistance needed to complete transfers. Pt unable to tolerate sitting OOB and is unsafe 2/2 need for use of restraints and impaired safety awareness. Pt has made small gains towards his dynamic sitting balance but is unable to maintain upright posture without RUE support due to impaired sustained attention and L side inattention. Pt demonstrates frequent emotional lability during sessions and perseverates on communicating with his significant other, making it difficult for patient to sustain attention and participate in therapy. Pt continues to require restraints due to pulling on PEG tube despite multiple daily attempts to educate pt and pt repeatedly verbalizing understanding to not pull on tube.   Patient continues to demonstrate the following deficits muscle weakness and hemiplegia, decreased cardiorespiratoy endurance, impaired timing and sequencing, unbalanced muscle activation, motor apraxia, ataxia, decreased coordination, and decreased motor planning, decreased midline orientation, decreased attention to left, left side neglect, and decreased motor planning, decreased initiation, decreased attention, decreased awareness, decreased problem solving, decreased safety awareness, decreased memory, and delayed processing, and decreased sitting balance, decreased standing balance, decreased postural control, hemiplegia, decreased balance strategies, and difficulty maintaining precautions and therefore will continue to benefit from skilled PT intervention to increase functional independence with mobility.  Patient  making minimal progress towards  STG but not progressing towards LTG . Care plan has been updated to DC to SNF at highest mobility level possible ASAP.   PT Short Term Goals Week 1:  PT Short Term Goal 1 (Week 1): Pt will tolerate upright, OOB sitting for at least 1 hour between therapy sessions. PT Short Term Goal 1 - Progress (Week 1): Progressing toward goal PT Short Term Goal 2 (Week 1): Pt will perform sit<>stands with +2 total assist PT Short Term Goal 2 - Progress (Week 1): Met PT Short Term Goal 3 (Week 1): Pt will complete bed<>chair transfers with +2 total assist PT Short Term Goal 3 - Progress (Week 1): Met PT Short Term Goal 4 (Week 1): Pt will demonstrate improved sitting balance with ability to maintain static sitting for at least 2 minutes without UE support PT Short Term Goal 4 - Progress (Week 1): Progressing toward goal Week 2:  PT Short Term Goal 1 (Week 2): Pt will tolerate upright, OOB sitting for at least 1 hour between therapy sessions. PT Short Term Goal 1 - Progress (Week 2): Progressing toward goal PT Short Term Goal 2 (Week 2): Pt will demonstrate improved sitting balance with ability to maintain static sitting for at least 2 minutes without UE support PT Short Term Goal 2 - Progress (Week 2): Progressing toward goal PT Short Term Goal 3 (Week 2): Pt will perform chair <>bed transfers w/max A x1 PT Short Term Goal 3 - Progress (Week 2): Progressing toward goal Week 3:  PT Short Term Goal 1 (Week 3): Pt will perform chair <>bed transfers w/max A x1 consistently PT Short Term Goal 2 (Week 3): Pt will demonstrate improved sitting balance with ability to maintain static sitting for at least 2 minutes without UE support PT Short Term Goal 3 (Week 3): Pt will sustain attention to task for 2 minutes at a time  Skilled  Therapeutic Interventions/Progress Updates:  Community reintegration;Ambulation/gait training;DME/adaptive equipment instruction;Neuromuscular re-education;Psychosocial support;Stair  training;UE/LE Strength taining/ROM;Wheelchair propulsion/positioning;Balance/vestibular training;Discharge planning;Functional electrical stimulation;Pain management;Skin care/wound management;Therapeutic Activities;UE/LE Coordination activities;Cognitive remediation/compensation;Disease management/prevention;Functional mobility training;Patient/family education;Splinting/orthotics;Therapeutic Exercise;Visual/perceptual remediation/compensation   Therapy Documentation Precautions:  Precautions Precautions: Fall, Other (comment) Precaution Comments: supplemental O2 via [place], L hemi, impaired safety awareness, impulsive, NPO Restrictions Weight Bearing Restrictions: No   Therapy/Group: Individual Therapy  Cruzita Lederer , PT, DPT 10/15/2020, 4:45 PM

## 2020-10-15 NOTE — Progress Notes (Signed)
Pt tolerating room air. Respirations even and unlabored. O2 sats 97%  10/15/20 0951  Oxygen Therapy  SpO2 97 %  O2 Device Room [id number]

## 2020-10-15 NOTE — Progress Notes (Signed)
Speech Language Pathology Daily Session Note  Patient Details  Name: Brent [name] MRN: [id number] Date of Birth: 01/14/1966  Today's Date: 10/15/2020 SLP Individual Time: 0805-0850 SLP Individual Time Calculation (min): 45 min  Short Term Goals: Week 3: SLP Short Term Goal 1 (Week 3): Patient will tolerate trials of smooth thick purees/pudding thick liquids with SLP only with minimal overt s/s aspiration/penetration. SLP Short Term Goal 2 (Week 3): Patient will attend to basic level functional task with modA cues for duration of 2-3 minutes. SLP Short Term Goal 3 (Week 3): Patient will follow commands to perform basic level functional tasks with modA cues. SLP Short Term Goal 4 (Week 3): Patient will use visual aids to orient to time/place/situation with minA cues.  Skilled Therapeutic Interventions:Skilled ST services focused on swallow and cognitive skills. SLP facilitated sustained attention, basic problem solving and error awareness skills in familiar card sorting task by 6 colors, pt demonstrated improvement from previous performance requiring only supervision A verbal cues for problem solving, error awareness and sustained attention in 5 minute intervals. Pt then sorting cards by novel shape  verse color in a field of 3 requiring min A verbal cues for problem solving/error awareness and sustained attention in 2-3 minute intervals with mod A verbal cues. Pt required max A verbal cues when sorting by shape in a field of 4. Pt completed oral care with set upset assist and consumed x3 TSP of purred textures with only mild swallow delay and no overt s/s aspiration. However SLP noted coughing without PO intake during session, likely due to possible aspiration on salvia. Pt missed 10 minute of ST services due to Neuropsychologist consult. Pt was left in room with Neuropsychologist. SLP recommends to continue skilled services.     Pain Pain Assessment Pain Scale: 0-10 Pain Score: 0-No  pain  Therapy/Group: Individual Therapy  Talynn Lebon  [place] 10/15/2020, 7:58 AM

## 2020-10-15 NOTE — Progress Notes (Signed)
Physical Therapy Session Note  Patient Details  Name: Brent [name] MRN: [id number] Date of Birth: 28-Jan-1966  Today's Date: 10/15/2020 PT Individual Time: 1105-1200 PT Individual Time Calculation (min): 55 min   Short Term Goals: Week 2:  PT Short Term Goal 1 (Week 2): Pt will tolerate upright, OOB sitting for at least 1 hour between therapy sessions. PT Short Term Goal 2 (Week 2): Pt will demonstrate improved sitting balance with ability to maintain static sitting for at least 2 minutes without UE support PT Short Term Goal 3 (Week 2): Pt will perform chair <>bed transfers w/max A x1  Skilled Therapeutic Interventions/Progress Updates:    Pt received sitting in TIS wheelchair with restraints donned and pt expecting therapist and agreeable to therapy session. Pt requesting to "play checkers" and educated on plan to work on progression to standing. Transported to/from gym in w/c for time management and energy conservation. R squat pivot transfer w/c>EOM with total assist of 1 for lifting/pivoting hips and total manual facilitation to lean trunk anteriorly to allow increased hip clearance during transfer - pt with poor ability to power through B LEs to assist with lifting hips during transfer. Able to maintain static sitting balance EOM with supervision.    Sit>stand elevated EOM>stedy with heavy max/total assist of 1 to lift to stand with pt demoing poor ability to bring weight forward and extend hip/knees to come up into standing position even with heavy compensation by pulling body forward with R hand on stedy bar. Sit>stand from stedy seat with max assist of 1 and same impairments as just noted. Pt only able to tolerate standing for ~10seconds with heavy max assist to prevent posterior LOB - heavy max facilitation at hips to maintain hip extension and forward weight shift over BOS. Attempted to engage pt in standing task to promote increased upright trunk/hip extended posture via card  matching game;however, pt becoming distracted with wanting to complete the card matching game rather than focusing on standing resulting in him sitting back down on stedy seat.   Pt reports sudden need to use bathroom. Transported back to room on stedy and in/out of bathroom on stedy with +2 assist for safety. Sit>stand from stedy seat as described above with +2 dependent assist for LB clothing management. Despite time on [facility] over toilet pt unable to void. Transported back to room mirror on stedy. Sit>stand from stedy seat in room in front of mirror for biofeedback to promote increased trunk/hip/knee extension and midline posture - requires heavy max assist to come to stand and then max facilitation at L knee to extend it up off of the stedy knee pad - demos R lateral lean/weight shift requiring cuing for midline orientation.   Pt becoming perseverative and emotional about wanting to call his wife, Brent Braun, with gentle emotional support and redirection able to refocus on standing task for ~38minutes until pt unable to be redirected further. Therapist assisted pt with calling his wife and pt in improved spirits. Pt left seated tilted back slightly (not too far as to avoid aspiration as pt chocking 3x during session from saliva) with needs in reach, B soft wrist restraints donned, telesitter in place, and NT present.     Therapy Documentation Precautions:  Precautions Precautions: Fall, Other (comment) Precaution Comments: supplemental O2 via [place], L hemi, impaired safety awareness, impulsive, NPO Restrictions Weight Bearing Restrictions: No   Pain: No reports of pain throughout session.   Therapy/Group: Individual Therapy  Tauheedah Bok Verdell Face , PT, DPT,  NCS, CSRS 10/15/2020, 8:03 AM

## 2020-10-16 DIAGNOSIS — I10 Essential (primary) hypertension: Secondary | ICD-10-CM

## 2020-10-16 LAB — GLUCOSE, CAPILLARY
Glucose-Capillary: 109 mg/dL — ABNORMAL HIGH (ref 70–99)
Glucose-Capillary: 110 mg/dL — ABNORMAL HIGH (ref 70–99)
Glucose-Capillary: 134 mg/dL — ABNORMAL HIGH (ref 70–99)
Glucose-Capillary: 149 mg/dL — ABNORMAL HIGH (ref 70–99)
Glucose-Capillary: 161 mg/dL — ABNORMAL HIGH (ref 70–99)
Glucose-Capillary: 182 mg/dL — ABNORMAL HIGH (ref 70–99)

## 2020-10-16 NOTE — Progress Notes (Signed)
Occupational Therapy Session Note  Patient Details  Name: Brent [name] MRN: [id number] Date of Birth: 1965/03/17  Today's Date: 10/16/2020 OT Individual Time: 1000-1056 OT Individual Time Calculation (min): 56 min    Short Term Goals: Week 3:  OT Short Term Goal 1 (Week 3): Pt will don shirt with min A and min cues actively focusing on his LUE. OT Short Term Goal 2 (Week 3): Pt will consistently sit to stand with mod A to prep for LB self care.  Skilled Therapeutic Interventions/Progress Updates:  Pt greeted supine in bed requesting to get OOB and agreeable to OT intervention. Session focus on BADL reeducation, functional sit<>stands from stedy, functional tranfsers and LUE GM coordination and FMC. Pt required  MAX A to don pants from bed level and MAX A to transition from supine>sitting with max cues and coaxing to initiate movement. Pt completed squat pivot transfer from EOB >w/c with MAX A +2. Pt transported to gym with total A.  Remainder of session to focus on sit<>stands in stedy with pt needing MAX A to stand in stedy. Pt only able to stand ~ 10 seconds initially but eventually able to stand for ~ 45 seconds with MAX A to maintain upright posture, added in functional reaching task with LUE to retrieve clothespins across midline and place on rod. Pt c/o pain in BLEs from standing therefore pt completed BUE therapeutic activities from sitting. Pt completed same functional reaching task with clothespins from sitting with pt using only level 1 clothespin with LUEs. Pt also completed AROM task with BUEs with LUE secured to un weighted  dowel rod with pt completed x10 reps of chest pressed and rows forward<>backward with OTA providing hand over hand assist to achieve full ROM. Pt becoming  hyperverbose at end of session adamantly requesting to "go call his old lady and have a pain pill." Pt transported back to room with total A where pt left supine in bed with all needs within reach, bed alarm  activated and wrist restraints applied.   Of note,  did get pt new phone as pts phone was not working, assisted ptpwith calling his wife however she did not answer.    Therapy Documentation Precautions:  Precautions Precautions: Fall, Other (comment) Precaution Comments: supplemental O2 via [place], L hemi, impaired safety awareness, impulsive, NPO Restrictions Weight Bearing Restrictions: No General:   Vital Signs:  Pain: Pt reports 9/10 pain in low back, offered rest breaks and repositioning as pain mgmt. Asked RN for pain meds at end of session.    Therapy/Group: Individual Therapy  Pollyann Glen [facility] 10/16/2020, 12:08 PM

## 2020-10-16 NOTE — Progress Notes (Signed)
Nutrition Follow-up  DOCUMENTATION CODES:   Non-severe (moderate) malnutrition in context of social or environmental circumstances  INTERVENTION:  Continue Osmolite 1.5 cal formula via PEG at goal volume of 355 ml (1.5 cartons/ARCs) given QID.    Provide 45 ml Prosource TF BID per tube.   Provide 115 ml free water flush before and after each feeding administration   Tube feeding regimen provides 2210 kcal (100% of needs), 111 grams of protein, and 1086 ml of H2O. Total free water: 2006 ml daily.  NUTRITION DIAGNOSIS:   Moderate Malnutrition related to social / environmental circumstances (CVA) as evidenced by moderate muscle depletion, percent weight loss, moderate fat depletion; ongoing  GOAL:   Patient will meet greater than or equal to 90% of their needs; met with TF  MONITOR:   Weight trends, Labs, I & O's, Diet advancement, Skin, TF tolerance  REASON FOR ASSESSMENT:   Consult Enteral/tube feeding initiation and management  ASSESSMENT:   Patient with PMH significant for HTN, HLD, DM, polysubstance use. Recently admitted to [facility] for acute ischemic/embolic stroke with L hemiparesis and severe dysphagia. Had PEG placed per IR on 8/9. Presents to CIR for further rehab therapy.  Pt continues NPO status. Pt has been tolerating his bolus tube feeding regimen via PEG. RD to continue with current orders. Labs and medications reviewed.  Diet Order:   Diet Order             Diet NPO time specified  Diet effective now                   EDUCATION NEEDS:   Not appropriate for education at this time  Skin:  Skin Assessment: Reviewed RN Assessment  Last BM:  8/31  Height:   Ht Readings from Last 1 Encounters:  09/27/20 6' (1.829 m)    Weight:   Wt Readings from Last 1 Encounters:  10/01/20 81.4 kg   BMI:  Body mass index is 24.34 kg/m.  Estimated Nutritional Needs:   Kcal:  2200-2400 kcal  Protein:  105-115 grams  Fluid:  >/= 2 L/day  Corrin Parker, MS, RD, LDN RD pager number/after hours weekend pager number on Amion.

## 2020-10-16 NOTE — Progress Notes (Signed)
Physical Therapy Session Note  Patient Details  Name: Brent [name] MRN: [id number] Date of Birth: 06/01/1965  Today's Date: 10/16/2020 PT Individual Time: 1300-1410 PT Individual Time Calculation (min): 70 min   Short Term Goals: Week 2:  PT Short Term Goal 1 (Week 2): Pt will tolerate upright, OOB sitting for at least 1 hour between therapy sessions. PT Short Term Goal 1 - Progress (Week 2): Progressing toward goal PT Short Term Goal 2 (Week 2): Pt will demonstrate improved sitting balance with ability to maintain static sitting for at least 2 minutes without UE support PT Short Term Goal 2 - Progress (Week 2): Progressing toward goal PT Short Term Goal 3 (Week 2): Pt will perform chair <>bed transfers w/max A x1 PT Short Term Goal 3 - Progress (Week 2): Progressing toward goal Week 3:  PT Short Term Goal 1 (Week 3): Pt will perform chair <>bed transfers w/max A x1 consistently PT Short Term Goal 2 (Week 3): Pt will demonstrate improved sitting balance with ability to maintain static sitting for at least 2 minutes without UE support PT Short Term Goal 3 (Week 3): Pt will sustain attention to task for 2 minutes at a time  Skilled Therapeutic Interventions/Progress Updates:   Received pt supine in bed with 2 NTs changing brief reporting frequent bowel incontinence today. Pt agreeable to PT treatment and reported pain in L foot throughout session from gout (premedicated). Session with emphasis on functional mobility/transfers, generalized strengthening, dynamic standing balance/coordination, NMR, attention, and improved activity tolerance. Assisted NT's in cleaning pt and donned pants in supine with +2 assist for time management purposes. Pt transferred supine<>sitting EOB with mod A +2 with pt stating "pull me up" - pt required encouragement to do as much as he could independently. Squat<>pivot bed<>TIS WC with mod A +2 with cues for reaching for armrests. Pt transported to/from dayroom in  TIS WC total A. Worked on reciprocal scooting to edge of chair with increased time and min A overall. Sit<>stand using 3 muskateer assist x 2 trials with mirror for visual feedback blocking L knee - cues for upright posture/gaze and hip and L knee extension. On trial 2 transitioned to pre-gait stepping with RLE x 5 reps with L knee blocked using mirror for visual feedback. Smelled strong odor and pt reported soiling brief. Transported back to room in TIS WC total A and transferred TIS WC<>bed squat<>pivot with min/mod A +2. Sit<>supine with mod A for BLE management. Checked brief and pt found to be clean. Pt then began screaming due to sudden gout pain in L foot and demanding pain medication. Checked with RN who reported pt up to date on pain medication. Pt then fixated on calling wife; encouraged pt to sit up to do so. Pt transferred supine<>sitting EOB with HOB elevated and use of bedrails with min/mod A of 1 due to motivation to call wife. Worked on dynamic sitting balance for ~5 minutes with close supervision while pt spoke to wife. Encouraged pt to work on fine motor control to dial phone numbers (and pt able to perform with therapist just holding phone). Pt refused any further standing due to gout pain in LLE and then reported urge to urinate. Therapist set up urinal in supine but pt began rolling, therefore soiling brief and chuck pad. Rolled L/R with min A of 1 and use of bedrails to remove soiled brief and don clean one. Pt scooted to [facility] with min A of 1 and use of Trendelenburg bed  position and able to pull himself up with BUE support on headboard. Concluded session with pt semi-reclined in bed, needs within reach, and bed alarm on. Bilateral wrist restraints donned and Telesitter in place.   Therapy Documentation Precautions:  Precautions Precautions: Fall, Other (comment) Precaution Comments: supplemental O2 via [place], L hemi, impaired safety awareness, impulsive, NPO Restrictions Weight Bearing  Restrictions: No  Therapy/Group: Individual Therapy Martin Majestic PT, DPT   10/16/2020, 7:51 AM

## 2020-10-16 NOTE — Progress Notes (Signed)
PHYSICAL MEDICINE & REHABILITATION PROGRESS NOTE  Subjective/Complaints: Patient seen sitting up in bed this morning.  He states he did not sleep well overnight due to restraints.  ROS: Limited due to cognition, but appears to deny CP, shortness of breath, nausea, vomiting, diarrhea.  Objective: Vital Signs: Blood pressure 132/87, pulse 67, temperature 97.8 F (36.6 C), resp. rate 18, height 6' (1.829 m), weight 81.4 kg, SpO2 97 %. No results found. Recent Labs    10/14/20 0636  WBC 10.8*  HGB 9.8*  HCT 29.6*  PLT 287     Recent Labs    10/14/20 0636  NA 138  K 4.0  CL 104  CO2 27  GLUCOSE 134*  BUN 18  CREATININE 0.87  CALCIUM 9.6      Intake/Output Summary (Last 24 hours) at 10/16/2020 1254 Last data filed at 10/16/2020 0700 Gross per 24 hour  Intake 0 ml  Output --  Net 0 ml            Physical Exam: BP 132/87 (BP Location: Right Arm)   Pulse 67   Temp 97.8 F (36.6 C)   Resp 18   Ht 6' (1.829 m)   Wt 81.4 kg   SpO2 97%   BMI 24.34 kg/m  Constitutional: No distress . Vital signs reviewed. HENT: Normocephalic.  Atraumatic. Eyes: EOMI. No discharge. Cardiovascular: No JVD.  RRR. Respiratory: Normal effort.  No stridor.  Bilateral clear to auscultation. GI: Non-distended.  BS +.  + PEG. Skin: Warm and dry.  Intact. Psych: Normal mood.  Normal behavior. Musc: No edema in extremities.  No tenderness in extremities. Neuro: Alert and oriented x2 Dysarthria, stable LUE: Shoulder abduction, elbow flexion/extension 3/5, handgrip 3/5, improving LLE: 3-/5 proximal distal  Assessment/Plan: 1. Functional deficits which require 3+ hours per day of interdisciplinary therapy in a comprehensive inpatient rehab setting. Physiatrist is providing close team supervision and 24 hour management of active medical problems listed below. Physiatrist and rehab team continue to assess barriers to discharge/monitor patient progress toward functional and  medical goals   Care Tool:  Bathing    Body parts bathed by patient: Abdomen, Chest, Face, Right arm, Left arm   Body parts bathed by helper:  (pt declined bathing all other body parts) Body parts n/a: Front perineal area, Buttocks   Bathing assist Assist Level: Moderate Assistance - Patient 50 - 74%     Upper Body Dressing/Undressing Upper body dressing   What is the patient wearing?: Pull over shirt    Upper body assist Assist Level: Moderate Assistance - Patient 50 - 74%    Lower Body Dressing/Undressing Lower body dressing      What is the patient wearing?: Pants     Lower body assist Assist for lower body dressing: Maximal Assistance - Patient 25 - 49%     Toileting Toileting Toileting Activity did not occur (Clothing management and hygiene only): N/A (no void or bm)  Toileting assist Assist for toileting: 2 Helpers     Transfers Chair/bed transfer  Transfers assist  Chair/bed transfer activity did not occur: Safety/medical concerns  Chair/bed transfer assist level: 2 Helpers     Locomotion Ambulation   Ambulation assist   Ambulation activity did not occur: Safety/medical concerns          Walk 10 feet activity   Assist  Walk 10 feet activity did not occur: Safety/medical concerns        Walk 50 feet activity   Assist Walk  50 feet with 2 turns activity did not occur: Safety/medical concerns         Walk 150 feet activity   Assist Walk 150 feet activity did not occur: Safety/medical concerns         Walk 10 feet on uneven surface  activity   Assist Walk 10 feet on uneven surfaces activity did not occur: Safety/medical concerns         Wheelchair     Assist Is the patient using a wheelchair?: Yes Type of Wheelchair: Manual Wheelchair activity did not occur: Safety/medical concerns         Wheelchair 50 feet with 2 turns activity    Assist    Wheelchair 50 feet with 2 turns activity did not occur:  Safety/medical concerns       Wheelchair 150 feet activity     Assist  Wheelchair 150 feet activity did not occur: Safety/medical concerns        Medical Problem List and Plan: 1.   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 Hx of prior stroke 6/22.  Continue CIR, plan for discharge to SNF 2.  Impaired mobility: Discontinue Lovenox given bleeding from PEG site.              -antiplatelet therapy: Has completed 3 weeks of Plavix, continue Aspirin 81mg  daily.  3. Low back pain: d/c IV Dilaudid. Discontinue Norco. Added lidocaine patch for lower back.   Controlled on 8/31 4. Anxiety: Continue Xanax as needed. Increased seroquel to 25mg  HS. Psych consulted as per mother's request. Can consult chaplain at night as well if patient is agitated- they have 247 service  8/29- continued suicidal ideations 8/30- psych saw pt today and made further adjustments  - increased seroquel, stopped abilify, increased lexapro 5. Neuropsych: This patient is not capable of making decisions on his own behalf.   Continue telemetry sitter for safety, continue.   Continue restraints. remove when family here 6. Skin/Wound Care: Routine skin checks 7. 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 9. Cocaine/tobacco abuse.UDS positive cocaine.  Counseled on appropriate 10.  Diabetes mellitus.  Hemoglobin A1c 6.4.   Diabetic teaching when appropriate  Levemir decreased to 8 on 8/17  Discontinue Novolog- no more hypoglycemic episodes after this CBG (last 3)  Recent Labs    10/16/20 0409 10/16/20 0747 10/16/20 1135   GLUCAP 109* 110* 161*   Increase Levemir to 6U  Slightly labile on 8/31, monitor for trend   11.  Hyperlipidemia.  Crestor/fenofibrate 12. Aspiration pneumonia.  Completed 14 days antibiotic 13.  Constipation.  MiraLAX 14.  Acute blood loss anemia  Hemoglobin 9.8 on 8/29 Plavix resumed as per GI recommendations. Continue PPI indefinitely. Repeat CBC periodically.   Endoscopy shows no ulcers 15.  Leukocytosis  WBCs 10.8 on 8/29  Afebrile 16.  AKI: Resolved 17. Hypernatremia: Sodium 139 on 8/16, improved 18. HTN: started propanolol 10mg  daily.   Vitals:   10/15/20 2005 10/16/20 1247  BP:  132/87  Pulse:  67  Resp:  18  Temp:    SpO2: 96% 97%  Controlled on 8/31   LOS: 19 days A FACE TO FACE EVALUATION WAS PERFORMED  Keven Osborn 10/17/20  Chardonay Scritchfield 10/16/2020, 12:54 PM

## 2020-10-16 NOTE — Progress Notes (Addendum)
Patient appears restless most of the shift and uncontrollable at this time.  Patient pulled out call light, kicked staff as they came in to help him.. pulled on his diaper and threw it to the floor. Patient chewed on pillow and refused to let staff remove chewed up pillow.  Patient was medicated with Xanax 0.25mg  as ordered x 2 today with change.  Norco given this morning for pain as requested.  Tylenol given this afternoon for pain as requested as well. Remains in bilateral wrist retrains with ROM times performed.  Provider on call called for intervention to calm patient down. PA stated to give patient his seroquel earlier than schedule.  Patient received his Tegretol and his seroquel at this time. Will continue to monitor.  Patient appears to be calm at this moment . He allowed staff to put a diaper on him. Continues to request for chaplin. Consult put in for chaplin visit.  Call placed out to on call provider to renew restraints order. Awaiting call back. Order renewed for Telesitter. Order obtained to renew soft restraints.

## 2020-10-16 NOTE — Consult Note (Signed)
  Psychiatry consult progress note  Brent [name] is a 55 y.o. male patient with past medical history significant for COPD, DM2, cocaine abuse, HLD, hypertension, OSA, GERD admitted with acute stroke currently in [facility].  Psych consulted for severe depression with anxiety and suicidal ideations.  Patient is seen and examined today for follow-up.  Patient is doing well.  Patient states his mood is good and rates it 10/10(10 is the best mood).  Patient reports no anxiety.  Patient states he could not fall asleep because of restraints.  Patient denies SI, HI, AVH.  Patient denies any medication side effects.  On examination, Patient is partially oriented.  Patient's speech is slurred.  He is oriented to place and person, not oriented to time. His mood is euthymic and affect is constricted.  Denies SI, HI, AVH.  Denies paranoia.  Plan -Continue Seroquel 100 nightly. - Continue Melatonin 3 mg nightly -Continue Lexapro 10 mg daily - Continue Propanolol 10 mg daily - Continue Xanax 0.25 mg 3 times daily as needed. - Corporate investment banker as needed.  Per primary team patient sometimes try to pull PEG tube and sitter can report to nurse. - Psychiatry will sign off.  Disposition:Patient is currently in rehab, will likely go to SNF at discharge.

## 2020-10-16 NOTE — Progress Notes (Signed)
Speech Language Pathology Daily Session Note  Patient Details  Name: GRANITE GODMAN MRN: [id number] Date of Birth: 1966-02-07  Today's Date: 10/16/2020 SLP Individual Time: 0800-0840 SLP Individual Time Calculation (min): 40 min  Short Term Goals: Week 3: SLP Short Term Goal 1 (Week 3): Patient will tolerate trials of smooth thick purees/pudding thick liquids with SLP only with minimal overt s/s aspiration/penetration. SLP Short Term Goal 2 (Week 3): Patient will attend to basic level functional task with modA cues for duration of 2-3 minutes. SLP Short Term Goal 3 (Week 3): Patient will follow commands to perform basic level functional tasks with modA cues. SLP Short Term Goal 4 (Week 3): Patient will use visual aids to orient to time/place/situation with minA cues.  Skilled Therapeutic Interventions:   Patient seen for skilled ST session focusing on cognitive and swallow function goals.. Patient with eyes closed when SLP entered the room but easily awakened. He did intermittently start closing his eyes throughout session. He requested SLP take wrist restraints off which SLP did for entire session. He consumed (self fed with spoon with SLP holding cup) container of chocolate pudding, and 4-5 spoon sips of honey thick liquids. No overt aspiration or penetration, voice sounded clear and no audible secretions observed prior to, during or after PO intake. Patient was not able to state date but was aware that his planned discharge was "the 9th". He also was able to verbalize why he needed the restraints, "because I pull things out" and although he complained that restraints were "a violation of my rights", he was very compliant when SLP putting wrist restraints back on. Overall he was very calm and cooperative. He was aware he is going to a "nursing home". Patient continues to benefit from skilled SLP intervention to maximize swallow, cognitive function prior to discharge.  Pain Pain  Assessment Pain Scale: 0-10 Pain Score: 0-No pain  Therapy/Group: Individual Therapy  Angela Nevin, MA, CCC-SLP Speech Therapy

## 2020-10-17 ENCOUNTER — Encounter: Payer: Self-pay | Admitting: Gastroenterology

## 2020-10-17 DIAGNOSIS — D72823 Leukemoid reaction: Secondary | ICD-10-CM

## 2020-10-17 DIAGNOSIS — F322 Major depressive disorder, single episode, severe without psychotic features: Secondary | ICD-10-CM

## 2020-10-17 DIAGNOSIS — R5381 Other malaise: Secondary | ICD-10-CM

## 2020-10-17 LAB — GLUCOSE, CAPILLARY
Glucose-Capillary: 109 mg/dL — ABNORMAL HIGH (ref 70–99)
Glucose-Capillary: 116 mg/dL — ABNORMAL HIGH (ref 70–99)
Glucose-Capillary: 117 mg/dL — ABNORMAL HIGH (ref 70–99)
Glucose-Capillary: 141 mg/dL — ABNORMAL HIGH (ref 70–99)
Glucose-Capillary: 159 mg/dL — ABNORMAL HIGH (ref 70–99)
Glucose-Capillary: 184 mg/dL — ABNORMAL HIGH (ref 70–99)

## 2020-10-17 LAB — SARS CORONAVIRUS 2 (TAT 6-24 HRS): SARS Coronavirus 2: NEGATIVE

## 2020-10-17 MED ORDER — QUETIAPINE FUMARATE 100 MG PO TABS: 100.0000 mg | ORAL_TABLET | Freq: Every day | ORAL | 0 refills | Status: DC

## 2020-10-17 MED ORDER — QUETIAPINE FUMARATE 25 MG PO TABS
25.0000 mg | ORAL_TABLET | Freq: Every day | ORAL | Status: DC | PRN
Start: 2020-10-17 — End: 2020-10-18
  Filled 2020-10-17: qty 1

## 2020-10-17 MED ORDER — ALBUTEROL SULFATE HFA 108 (90 BASE) MCG/ACT IN AERS: 2.0000 | INHALATION_SPRAY | Freq: Four times a day (QID) | RESPIRATORY_TRACT | Status: DC | PRN

## 2020-10-17 MED ORDER — QUETIAPINE FUMARATE 50 MG PO TABS
100.0000 mg | ORAL_TABLET | Freq: Every day | ORAL | Status: DC
  Administered 2020-10-17: 100 mg
  Filled 2020-10-17: qty 2

## 2020-10-17 MED ORDER — FREE WATER: 230.0000 mL | Freq: Four times a day (QID) | Status: DC

## 2020-10-17 MED ORDER — ALPRAZOLAM 0.25 MG PO TABS: 0.2500 mg | ORAL_TABLET | Freq: Three times a day (TID) | ORAL | 0 refills | Status: DC | PRN

## 2020-10-17 MED ORDER — PROPRANOLOL HCL 10 MG PO TABS
10.0000 mg | ORAL_TABLET | Freq: Two times a day (BID) | ORAL | Status: DC
  Administered 2020-10-17 – 2020-10-18 (×2): 10 mg
  Filled 2020-10-17 (×2): qty 1

## 2020-10-17 MED ORDER — LIDOCAINE 5 % EX PTCH
1.0000 | MEDICATED_PATCH | CUTANEOUS | 0 refills | Status: DC
Start: 2020-10-18 — End: 2021-09-01

## 2020-10-17 MED ORDER — PROPRANOLOL HCL 10 MG PO TABS: 10.0000 mg | ORAL_TABLET | Freq: Two times a day (BID) | ORAL | Status: DC

## 2020-10-17 MED ORDER — ROSUVASTATIN CALCIUM 20 MG PO TABS
20.0000 mg | ORAL_TABLET | Freq: Every day | ORAL | Status: DC
Start: 2020-10-18 — End: 2022-09-23

## 2020-10-17 MED ORDER — HYDROCODONE-ACETAMINOPHEN 5-325 MG PO TABS: 1.0000 | ORAL_TABLET | Freq: Every day | ORAL | 0 refills | Status: DC | PRN

## 2020-10-17 MED ORDER — INSULIN DETEMIR 100 UNIT/ML ~~LOC~~ SOLN: 6.0000 [IU] | Freq: Two times a day (BID) | SUBCUTANEOUS | 11 refills | Status: DC

## 2020-10-17 MED ORDER — PANTOPRAZOLE SODIUM 40 MG PO PACK: 40.0000 mg | PACK | Freq: Two times a day (BID) | ORAL | Status: DC

## 2020-10-17 MED ORDER — CLOPIDOGREL BISULFATE 75 MG PO TABS
75.0000 mg | ORAL_TABLET | Freq: Every day | ORAL | Status: DC
  Administered 2020-10-17 – 2020-10-18 (×2): 75 mg
  Filled 2020-10-17 (×2): qty 1

## 2020-10-17 MED ORDER — OSMOLITE 1.5 CAL PO LIQD: 355.0000 mL | Freq: Four times a day (QID) | ORAL | 0 refills | Status: DC

## 2020-10-17 MED ORDER — ESCITALOPRAM OXALATE 10 MG PO TABS: 10.0000 mg | ORAL_TABLET | Freq: Every day | ORAL | Status: DC

## 2020-10-17 NOTE — Progress Notes (Signed)
Covid specimen obtained for SNF placement. Specimen sent to lab.

## 2020-10-17 NOTE — Progress Notes (Signed)
Speech Language Pathology Weekly Progress and Session Note  Patient Details  Name: Brent [name] MRN: [id number] Date of Birth: 03-30-1965  Beginning of progress report period:  10/10/2020 End of progress report period:  10/17/2020  Today's Date: 10/17/2020 SLP Individual Time: 0900-1000 SLP Individual Time Calculation (min): 60 min  Short Term Goals: Week 3: SLP Short Term Goal 1 (Week 3): Patient will tolerate trials of smooth thick purees/pudding thick liquids with SLP only with minimal overt s/s aspiration/penetration. SLP Short Term Goal 1 - Progress (Week 3): Met SLP Short Term Goal 2 (Week 3): Patient will attend to basic level functional task with modA cues for duration of 2-3 minutes. SLP Short Term Goal 2 - Progress (Week 3): Met SLP Short Term Goal 3 (Week 3): Patient will follow commands to perform basic level functional tasks with modA cues. SLP Short Term Goal 3 - Progress (Week 3): Met SLP Short Term Goal 4 (Week 3): Patient will use visual aids to orient to time/place/situation with minA cues. SLP Short Term Goal 4 - Progress (Week 3): Met SLP Short Term Goal 5 (Week 3): Patient will demonstrate adequate attention and awareness to errors when completing basic level problem solving tasks with modA.    New Short Term Goals: Week 4: SLP Short Term Goal 1 (Week 4): Patient will tolerate large snack/small meal size PO's of puree solids and pudding thick liquids without overt s/s aspiration/penetration. SLP Short Term Goal 2 (Week 4): Patient will demonstrate recall and demonstrate performance of basic level, learned strategies/exercises with modA. SLP Short Term Goal 3 (Week 4): Patient will perform  Weekly Progress Updates:  Patient made good progress and met all of his goals, currently functioning at [facility] cue level for basic level problem solving, etc. He has demonstrated decreased overt s/s aspiration and penetration with puree solids, pudding thick liquids and repeat  MBS scheduled 9/2 to determine if any functional change in swallow.    Intensity: Minumum of 1-2 x/day, 30 to 90 minutes Frequency: 3 to 5 out of 7 days Duration/Length of Stay: 9/9 but awaiting SNF approval Treatment/Interventions: Cognitive remediation/compensation;Dysphagia/aspiration precaution training;Internal/external aids;Speech/Language facilitation;Therapeutic Activities;Cueing hierarchy;Patient/family education;Functional tasks;Therapeutic Exercise   Daily Session  Skilled Therapeutic Interventions: Patient seen for skilled ST session focusing on cognitive and swallow function goals. He consumed puree solids, dysphagia 3 soft solids without observed delays in mastication or oral transit and without overt s/s aspiration or penetration. Voice was clear, strong. SLP plans to reassess swallow function to determine if any improvements. Patient was approximately oriented to month, stating it was August and at the end. He continues to verbalize understanding of restraints and told SLP, "I really showed my ass yesterday" and per chart review patient was agitated. He told SLP that he does some things "for attention". He requested to call his mother and SLP dialed number and he did demonstrate recall of recent events and plan for swallow test tomorrow while talking to his Mom.      General    Pain Pain Assessment Pain Scale: 0-10 Pain Score: 0-No pain Pain Location: Back Pain Orientation: Lower Pain Intervention(s): Medication (See eMAR)  Therapy/Group: Individual Therapy  Sonia Baller, MA, CCC-SLP Speech Therapy

## 2020-10-17 NOTE — Progress Notes (Addendum)
Patient ID: Brent [name], male   DOB: Mar 31, 1965, 55 y.o.   MRN: [id number]  Message received from [facility] regarding if received medicaid approval. Have left message for her to call worker back, to discuss transfer date. Await return call from Debbie  3:00 PM Spoke with his Mom on the telephone to update regarding insurance approval and the plan he may transfer there tomorrow. She will need to complete paperwork and admission coordinator is gone for the day, so will speak with in am and let her know what I find out. Pt aware going to [facility] but unsure when. Will update in am

## 2020-10-17 NOTE — Discharge Summary (Addendum)
Physician Discharge Summary  Patient ID: Brent [name] MRN: [id number] DOB/AGE: 1965/04/12 55 y.o.  Admit date: 09/27/2020 Discharge date: 10/18/2020  Discharge Diagnoses:  Principal Problem:   Acute blood loss anemia Active Problems:   Uncontrolled type 2 diabetes mellitus, with long-term current use of insulin (HCC)   Mixed diabetic hyperlipidemia associated with type 2 diabetes mellitus (HCC)   Essential hypertension   Acute stroke due to ischemia (HCC)   Labile blood glucose   Leukocytosis   S/P percutaneous endoscopic gastrostomy (PEG) tube placement (HCC)   Malnutrition of moderate degree   PEG (percutaneous endoscopic gastrostomy) status (HCC)   Adjustment disorder with mixed anxiety and depressed mood Aspiration pneumonia Acute blood loss anemia AKI-resolved Polysubstance abuse Tobacco abuse History of CVA  Discharged Condition: Stable  Significant Diagnostic Studies: DG Abd 1 View  Result Date: 10/01/2020 CLINICAL DATA:  Feeding tube placement. EXAM: ABDOMEN - 1 VIEW COMPARISON:  Gastrostomy tube placement exam yesterday. FINDINGS: Portable AP supine view of the abdomen obtained after the installation of 30 cc via indwelling gastrostomy tube. Gastrostomy tube balloon projects over the left upper quadrant. Contrast opacifies the stomach. There is no evidence of extravasation or leak. Again seen contrast in the colon from additional recent gastrostomy evaluations. Contrast seen left laterally on yesterday's exam is not included in the field of view. IMPRESSION: 1. Gastrostomy tube in the stomach. No evidence of extravasation or leak. 2. Contrast in the colon from recent gastrostomy evaluations. Electronically Signed   By: Narda Rutherford M.D.   On: 10/01/2020 21:09   DG Abd 1 View  Result Date: 09/30/2020 CLINICAL DATA:  Peg placement. EXAM: ABDOMEN - 1 VIEW COMPARISON:  Radiograph yesterday. FINDINGS: Single frontal view of the abdomen obtained. Gastrostomy tube  projects over the left upper quadrant in the region of the stomach. There is contrast opacifying the gastric lumen. Type and amount of contrast is not specified. There is no evidence of extravasation or leak. Contrast is visualized about the left lateral abdomen but is likely external to the patient, and is lateral to the colon only partially included in the field of view. There is contrast in the colon from yesterday's exam. IMPRESSION: 1. Gastrostomy tube in the stomach with contrast opacifying the gastric lumen. No evidence of extravasation or leak. 2. Contrast about the left lateral abdomen is likely external to the patient, and only partially included in the field of view. 3. There is contrast within the left colon from yesterday's exam. Electronically Signed   By: Narda Rutherford M.D.   On: 09/30/2020 15:31   DG Abd 1 View  Result Date: 09/29/2020 CLINICAL DATA:  Peg tube placement. EXAM: ABDOMEN - 1 VIEW COMPARISON:  None. FINDINGS: An image was obtained after injection through the PEG tube. Contrast fills the distal stomach and the duodenum. No evidence of leak. IMPRESSION: Peg tube appears to be in good position with contrast in the distal stomach and duodenum. No leak identified. Electronically Signed   By: Gerome Sam III M.D.   On: 09/29/2020 12:43   CT CHEST WO CONTRAST  Result Date: 09/23/2020 CLINICAL DATA:  Pneumonia, effusion, or abscess suspected. EXAM: CT CHEST WITHOUT CONTRAST TECHNIQUE: Multidetector CT imaging of the chest was performed following the standard protocol without IV contrast. COMPARISON:  Chest radiograph 09/23/2020 and earlier; CT abdomen pelvis 09/12/2020 FINDINGS: Cardiovascular: Multivessel coronary artery disease. Mild calcific atherosclerosis of the aortic arch. Heart is normal in size. No abnormal pericardial fluid. Mediastinum/Nodes: Trace secretions within the trachea  at the level of the carina. Enlarged 1.2 cm subcarinal lymph node as well as a 1.0 cm  subaortic lymph node, likely reactive. Lungs/Pleura: Mild paraseptal emphysema in the left upper lobe. No pleural effusion. No pneumothorax. Patchy peribronchovascular consolidations in the left lung, greatest in the left lower lobe (series 6, image 78). At the lateral aspect of the anterior right upper lobe, there is a 0.4 cm nodule versus patchy consolidation (series 6, image 76). Faint reticulonodular opacity in the medial aspect of the anterior right upper lobe (series 6, images 71-75, 88). Of note, multifocal patchy airspace opacities seen in the right middle and right lower lobes on CT abdomen dated 09/12/2020 have nearly completely resolved. Upper Abdomen: Enteric tube is visualized within the gastric antrum. Partially visualized gallstones, unchanged compared to 09/12/2020. Remainder of the visualized upper abdomen is unremarkable. Musculoskeletal: Old left lower and right lateral rib fractures. No suspicious osseous lesion. IMPRESSION: 1. Multifocal patchy peribronchovascular consolidations in the left lung, greatest in the left lower lobe, consistent with multifocal pneumonia. 2. A 0.4 cm pulmonary nodule versus patchy consolidation is noted in the anterior right upper lobe. Recommend attention on follow-up imaging. 3. Mediastinal lymphadenopathy, favored to be reactive in the setting of infection. 4. Multivessel coronary artery disease and calcific atherosclerosis of the aortic arch. 5. Mild left upper lobe emphysema. Aortic Atherosclerosis (ICD10-I70.0) and Emphysema (ICD10-J43.9). Electronically Signed   By: Sherron Ales MD   On: 09/23/2020 09:49   CT ABDOMEN PELVIS W CONTRAST  Result Date: 09/26/2020 CLINICAL DATA:  GI bleeding, peg tube malfunction, blood returning from 2 after patient knocked over IV pole while feeds were connected. EXAM: CT ABDOMEN AND PELVIS WITH CONTRAST TECHNIQUE: Multidetector CT imaging of the abdomen and pelvis was performed using the standard protocol following bolus  administration of intravenous contrast. CONTRAST:  24mL OMNIPAQUE IOHEXOL 350 MG/ML SOLN COMPARISON:  Peg tube placement procedural images 8922, abdominal radiograph 09/16/2020, CT 09/12/2020 FINDINGS: Lower chest: Patchy and reticular opacities are seen in the left lung base some of which may post infectious or inflammatory scarring and architectural distortion and more coalescent density laterally could reflect residual airspace disease. Additional mild bandlike scarring or atelectasis the right lung base lingula. Normal heart size. No pericardial effusion. Coronary artery atherosclerosis. Hepatobiliary: No concerning focal liver lesion. Smooth surface contour. Normal hepatic attenuation. Gallbladder containing multiple dependently layering gallstones. No pericholecystic fluid or inflammation or biliary ductal dilatation nor visible intraductal gallstone. Pancreas: No pancreatic ductal dilatation or surrounding inflammatory changes. Spleen: Normal in size. No concerning splenic lesions. Small accessory splenule towards the hilum. Adrenals/Urinary Tract: Normal adrenals. Mild bilateral perinephric stranding is a chronic finding, similar to comparison priors. No visible or contour deforming renal lesion. Kidneys enhance and excrete symmetrically. Mild bladder wall thickening, nonspecific given the degree of distention. Stomach/Bowel: Distal esophagus is unremarkable. Percutaneous gastrostomy tube is noted with retention balloon appearing appropriately intraluminal. Small amount of stranding along the PEG tube placement track is nonspecific given recent placement procedure. No organized collection. No extraluminal gas or free fluid. No extravasation of enteric contrast media. Distal stomach and duodenum are unremarkable. No small bowel thickening or dilatation. Normal appendix in the right lower quadrant. Moderate colonic stool burden. Scattered colonic diverticula without focal inflammation to suggest  diverticulitis. No colonic dilatation or wall thickening. Vascular/Lymphatic: Atherosclerotic calcifications within the abdominal aorta and branch vessels. No aneurysm or ectasia. No enlarged abdominopelvic lymph nodes. Reproductive: The prostate and seminal vesicles are unremarkable. Other: Postsurgical changes from percutaneous gastrostomy  tube placement. Small amount of stranding along the percutaneous tract. No organized abscess, collection or extravasation of fluid or contrast. No abdominopelvic free air or fluid. Postsurgical changes of the atria abdominal wall in multiple locations, correlate with prior procedural history. Fat containing inguinal hernias. No bowel containing hernia. Musculoskeletal: No acute osseous abnormality or suspicious osseous lesion. Chronic abdominal wall laxity is similar to prior. Remaining musculature is normal and symmetric. IMPRESSION: Percutaneous gastrostomy tube appears appropriately positioned with the retention balloon remaining intraluminal at this time. Small amount of stranding along the percutaneous gastrostomy tube placement tract is nonspecific and likely normal postprocedural change given that the patient is only postop day 2 from the procedure. No extraluminal gas, free fluid, organized collection or abscess is seen. Mild bladder wall thickening, nonspecific given underdistention. Could correlate for urinary symptoms and with urinalysis where appropriate. Chronic abdominal wall laxity and extensive postsurgical changes the anterior abdominal wall. Correlate with prior surgical history. Patchy reticular opacities in the left lung base, some of which may be related to post infectious or inflammatory scarring given consolidation in this location on prior CT. Some more coalescent density laterally could reflect residual airspace disease or possible round atelectasis. Electronically Signed   By: Kreg Shropshire M.D.   On: 09/26/2020 21:08   IR GASTROSTOMY TUBE MOD  SED  Result Date: 09/25/2020 CLINICAL DATA:  Shortness of breath, dysphagia with aspiration, needs enteral feeding support EXAM: PERC PLACEMENT GASTROSTOMY FLUOROSCOPY TIME:  3 minutes 24 seconds TECHNIQUE: The procedure, risks, benefits, and alternatives were explained to the patient. Questions regarding the procedure were encouraged and answered. The patient understands and consents to the procedure. As antibiotic prophylaxis, cefazolin 2 g was ordered pre-procedure and administered intravenously within one hour of incision. A safe percutaneous approach was confirmed on previous day's CT chest. A 5 French angiographic catheter was placed as orogastric tube. The upper abdomen was prepped with Betadine, draped in usual sterile fashion, and infiltrated locally with 1% lidocaine. Intravenous Fentanyl and Versed 1.5mg  were administered as conscious sedation during continuous monitoring of the patient's level of consciousness and physiological / cardiorespiratory status by the radiology RN, with a total moderate sedation time of 8 minutes. 0.5 mg glucagon given IV to facilitate gastric distention.Stomach was insufflated using air through the orogastric tube. An 32 French sheath needle was advanced percutaneously into the gastric lumen under fluoroscopy. Gas could be aspirated and a small contrast injection confirmed intraluminal spread. The sheath was exchanged over a guidewire for a 9 [place] vascular sheath, through which the snare device was advanced and used to snare a guidewire passed through the orogastric tube. This was withdrawn, and the snare attached to the 20 French pull-through gastrostomy tube, which was advanced antegrade, positioned with the internal bumper securing the anterior gastric wall to the anterior abdominal wall. Small contrast injection confirms appropriate positioning. The external bumper was applied and the catheter was flushed. COMPLICATIONS: COMPLICATIONS none IMPRESSION: 1.  Technically successful 20 French pull-through gastrostomy placement under fluoroscopy. Electronically Signed   By: Corlis Leak M.D.   On: 09/25/2020 07:28   DG Chest Port 1 View  Result Date: 10/01/2020 CLINICAL DATA:  Acute respiratory distress EXAM: PORTABLE CHEST 1 VIEW COMPARISON:  09/23/2020 FINDINGS: Cardiac shadow is within normal limits. Feeding catheter has been removed. The lungs are well aerated bilaterally. Previously seen left-sided airspace opacities have resolved in the interval. No new focal abnormality is noted. IMPRESSION: No acute abnormality noted. Electronically Signed   By: Loraine Leriche  Lukens M.D.   On: 10/01/2020 19:56   DG CHEST PORT 1 VIEW  Result Date: 09/23/2020 CLINICAL DATA:  Hypoxia EXAM: PORTABLE CHEST 1 VIEW COMPARISON:  09/21/2020, CT 11/03/2019, radiograph 09/14/2020 FINDINGS: Esophageal tube tip below the diaphragm but incompletely included. Right lung grossly clear. Volume loss with shift of mediastinal contents to the left. Small left-sided pleural effusion. Slightly improved aeration at left lung base. Stable cardiomediastinal silhouette with aortic atherosclerosis IMPRESSION: 1. Slightly improved aeration at left lung base with residual small left effusion and airspace disease. Persistent volume loss with shift of mediastinal contents to the left, central obstructing process is possible and chest CT follow-up should be considered. Electronically Signed   By: Jasmine Pang M.D.   On: 09/23/2020 03:09   DG Chest Port 1 View  Result Date: 09/21/2020 CLINICAL DATA:  Abnormal respiration EXAM: PORTABLE CHEST 1 VIEW COMPARISON:  09/20/2020 and prior studies FINDINGS: Cardiomediastinal silhouette is unchanged. A small bore feeding tube is again identified entering the stomach with tip off the field of view. LEFT basilar opacity has slightly improved. No other significant changes are noted. No pneumothorax. IMPRESSION: Slightly improved LEFT basilar consolidation/atelectasis. No  other significant change. Electronically Signed   By: Harmon Pier M.D.   On: 09/21/2020 08:38   DG CHEST PORT 1 VIEW  Result Date: 09/20/2020 CLINICAL DATA:  Shortness of breath. EXAM: PORTABLE CHEST 1 VIEW COMPARISON:  09/19/2020 FINDINGS: 0 0947 hours. Volume loss left hemithorax again noted with left base collapse/consolidative opacity. Right lung is hyperexpanded but clear. A feeding tube passes into the stomach although the distal tip position is not included on the film. Telemetry leads overlie the chest. IMPRESSION: Volume loss left hemithorax with left base collapse/consolidative opacity. No substantial change. Electronically Signed   By: Kennith Center M.D.   On: 09/20/2020 10:21   DG CHEST PORT 1 VIEW  Result Date: 09/19/2020 CLINICAL DATA:  Follow-up mucous plug EXAM: PORTABLE CHEST 1 VIEW COMPARISON:  09/17/2020. Multiple previous as distant as 09/11/2020. FINDINGS: The right lung remains clear, hyperinflated. Slight improved aeration of the left upper lobe. Continued complete collapse of the left lower lobe. Small amount of pleural fluid on the left as seen previously. Soft feeding tube enters the abdomen. IMPRESSION: Slight improved aeration of the left upper lobe. Continued complete collapse of the left lower lobe. Electronically Signed   By: Paulina Fusi M.D.   On: 09/19/2020 07:53   DG Abd Portable 1V  Result Date: 10/13/2020 CLINICAL DATA:  Feeding tube placement. EXAM: PORTABLE ABDOMEN - 1 VIEW COMPARISON:  October 01, 2020. FINDINGS: The bowel gas pattern is normal. Gastrostomy tube tip appears to be well position within gastric lumen. Contrast was injected which is seen filling gastric lumen. No extravasation or leakage is noted. No radio-opaque calculi or other significant radiographic abnormality are seen. IMPRESSION: Gastrostomy tube tip in grossly good position. No abnormal bowel dilatation. Electronically Signed   By: Lupita Raider M.D.   On: 10/13/2020 16:22   DG Swallowing  Func-Speech Pathology  Result Date: 10/04/2020 Table formatting from the original result was not included. Objective Swallowing Evaluation: Type of Study: MBS-Modified Barium Swallow Study  Patient Details Name: KEVAN PROUTY MRN: [id number] Date of Birth: 08-11-1965 Today's Date: 10/04/2020 Time: SLP Start Time (ACUTE ONLY): 1538 -SLP Stop Time (ACUTE ONLY): 1608 SLP Time Calculation (min) (ACUTE ONLY): 30 min Past Medical History: Past Medical History: Diagnosis Date  Aplastic anemia, unspecified (HCC)   Chest pain, unspecified  Chronic airway obstruction, not elsewhere classified   Cocaine substance abuse (HCC)   Neuropathy   Other and unspecified hyperlipidemia   Shortness of breath   Stroke (HCC) 06/17/2020  Tobacco use disorder   Type II or unspecified type diabetes mellitus without mention of complication, not stated as uncontrolled   Unspecified epilepsy without mention of intractable epilepsy   Unspecified essential hypertension  Past Surgical History: Past Surgical History: Procedure Laterality Date  BIOPSY  10/02/2020  Procedure: BIOPSY;  Surgeon: Jenel Lucks, MD;  Location: [facility] ENDOSCOPY;  Service: Gastroenterology;;  CARDIAC CATHETERIZATION  10/02/2010   Nonobstructive mild coronary plaque  ESOPHAGOGASTRODUODENOSCOPY N/A 10/02/2020  Procedure: ESOPHAGOGASTRODUODENOSCOPY (EGD);  Surgeon: Jenel Lucks, MD;  Location: [facility] ENDOSCOPY;  Service: Gastroenterology;  Laterality: N/A;  IR GASTROSTOMY TUBE MOD SED  09/24/2020  KNEE SURGERY Bilateral 2018  Repair  PERFORATED VISCUS SURGERY    MULTIPLE FRACTURES HPI: Pt is a 55 y/o male who presented to the ED with SOB and was noted to have L hemiparesis and significant dysarthria. Patient admitted to [facility] with acute respiratory failure with hypoxia secondary to PNA, COPD exacerbation and sepsis. Imaging revealed R ACA CVA x2 within R cingulate gyrus/callosal body, R parietal lobe subcortical white matter infarct and questionable acute punctate infarct  within L frontal lobe subcortical white matter. Transferred to ICU at [facility] 7/31 due to ongoing respiratory failure with complete opacification of left hemithorax. PMH: cocaine and tobacco use disorder, lumbago, HTN, HLD, DMII and Hx of CVA. Pt with known dysphagia (since first MBS 11/2018) and noncompliance with recommendations. MBS completed in June with recommendation for regular textures and honey thick liquids. MBS 7/28: oropharyngeal dysphagia with consistent small amounts of aspiration which are not cleared by pt following weak delayed cough. despite. Weak bolus manipulation, reduced bolus cohesion, a pharyngeal delay, and reduced laryngeal vestibule closure. An NPO status with alternate means of nutrition was recommended at that time. Patient had PEG placement 8/9.  Subjective: "I know what I need to do." (stop drugs) Assessment / Plan / Recommendation CHL IP CLINICAL IMPRESSIONS 10/04/2020 Clinical Impression Patient presents with a moderate oral and a mod-severe pharyngeal phase dysphagia which appears similar as compared to most recent past MBS on 7/28. During oral phase of swallow, patient exhibited reduced mastication of solids, delays in anterior to posterior transit of boluses and premature spillage of all boluses into vallecular sinus. Swallow was initiated at level of vallecular sinus for all tested boluses. During pharyngeal phase of swallow, patient exhibited silent aspiration (PAS 8) of mild-moderate amount with puree solids on 2-3 of 7 trials with aspiration occuring during the swallow. Patient exhibited silent aspiration during the swallow (PAS 8) as well as penetration to level of vocal cords after the swallow. (PAS 5). He was able to clear penetrate with cued cough, however all aspiration and penetration events were silent with patient exhibiting no evidence of sensation. With honey thick liquids and nectar thick liquids (via spoon and cup sip), patient exhibited silent aspiration (PAS 8) 3 of 5  trials with honey thick, 2 of 2 trials with nectar thick. SLP is recommending to continue NPO status with plan for trials of pudding thick liquids/smooth thick purees (pudding, etc) with SLP only. SLP Visit Diagnosis -- Attention and concentration deficit following -- Frontal lobe and executive function deficit following -- Impact on safety and function --   CHL IP TREATMENT RECOMMENDATION 09/12/2020 Treatment Recommendations Therapy as outlined in treatment plan below   Prognosis 10/04/2020 Prognosis for  Safe Diet Advancement Guarded Barriers to Reach Goals Severity of deficits;Time post onset Barriers/Prognosis Comment -- CHL IP DIET RECOMMENDATION 10/04/2020 SLP Diet Recommendations NPO;Ice chips PRN after oral care Liquid Administration via -- Medication Administration Via alternative means Compensations -- Postural Changes --   CHL IP OTHER RECOMMENDATIONS 10/04/2020 Recommended Consults -- Oral Care Recommendations Oral care QID;Staff/trained caregiver to provide oral care Other Recommendations --   CHL IP FOLLOW UP RECOMMENDATIONS 09/25/2020 Follow up Recommendations Skilled Nursing facility   [facility] IP FREQUENCY AND DURATION 09/12/2020 Speech Therapy Frequency (ACUTE ONLY) min 3x week Treatment Duration 1 week      CHL IP ORAL PHASE 10/04/2020 Oral Phase Impaired Oral - Pudding Teaspoon -- Oral - Pudding Cup -- Oral - Honey Teaspoon Premature spillage Oral - Honey Cup Premature spillage Oral - Nectar Teaspoon Premature spillage Oral - Nectar Cup Premature spillage;Delayed oral transit;Reduced posterior propulsion;Weak lingual manipulation Oral - Nectar Straw -- Oral - Thin Teaspoon -- Oral - Thin Cup -- Oral - Thin Straw -- Oral - Puree Premature spillage;Weak lingual manipulation;Reduced posterior propulsion;Delayed oral transit Oral - Mech Soft -- Oral - Regular Premature spillage;Weak lingual manipulation;Impaired mastication;Reduced posterior propulsion;Piecemeal swallowing Oral - Multi-Consistency -- Oral - Pill  -- Oral Phase - Comment --  CHL IP PHARYNGEAL PHASE 10/04/2020 Pharyngeal Phase Impaired Pharyngeal- Pudding Teaspoon -- Pharyngeal -- Pharyngeal- Pudding Cup -- Pharyngeal -- Pharyngeal- Honey Teaspoon Delayed swallow initiation-vallecula;Reduced airway/laryngeal closure;Penetration/Aspiration during swallow;Moderate aspiration;Pharyngeal residue - pyriform Pharyngeal Material enters airway, passes BELOW cords without attempt by patient to eject out (silent aspiration) Pharyngeal- Honey Cup Delayed swallow initiation-vallecula;Reduced airway/laryngeal closure;Trace aspiration;Moderate aspiration;Penetration/Apiration after swallow Pharyngeal Material enters airway, passes BELOW cords without attempt by patient to eject out (silent aspiration) Pharyngeal- Nectar Teaspoon Delayed swallow initiation-vallecula;Reduced airway/laryngeal closure;Penetration/Aspiration during swallow Pharyngeal Material enters airway, passes BELOW cords without attempt by patient to eject out (silent aspiration) Pharyngeal- Nectar Cup Penetration/Aspiration during swallow;Reduced airway/laryngeal closure;Delayed swallow initiation-vallecula Pharyngeal Material enters airway, passes BELOW cords without attempt by patient to eject out (silent aspiration) Pharyngeal- Nectar Straw -- Pharyngeal -- Pharyngeal- Thin Teaspoon -- Pharyngeal -- Pharyngeal- Thin Cup -- Pharyngeal -- Pharyngeal- Thin Straw -- Pharyngeal -- Pharyngeal- Puree Delayed swallow initiation-vallecula;Reduced airway/laryngeal closure;Penetration/Aspiration during swallow;Penetration/Apiration after swallow Pharyngeal Material enters airway, passes BELOW cords without attempt by patient to eject out (silent aspiration);Material enters airway, CONTACTS cords and not ejected out Pharyngeal- Mechanical Soft -- Pharyngeal -- Pharyngeal- Regular -- Pharyngeal -- Pharyngeal- Multi-consistency -- Pharyngeal -- Pharyngeal- Pill -- Pharyngeal -- Pharyngeal Comment Patient able to clear  penetrate with cued cough  CHL IP CERVICAL ESOPHAGEAL PHASE 10/04/2020 Cervical Esophageal Phase WFL Pudding Teaspoon -- Pudding Cup -- Honey Teaspoon -- Honey Cup -- Nectar Teaspoon -- Nectar Cup -- Nectar Straw -- Thin Teaspoon -- Thin Cup -- Thin Straw -- Puree -- Mechanical Soft -- Regular -- Multi-consistency -- Pill -- Cervical Esophageal Comment -- Angela Nevin, MA, CCC-SLP Speech Therapy              Labs:  Basic Metabolic Panel: Recent Labs  Lab 10/14/20 0636  NA 138  K 4.0  CL 104  CO2 27  GLUCOSE 134*  BUN 18  CREATININE 0.87  CALCIUM 9.6    CBC: Recent Labs  Lab 10/14/20 0636  WBC 10.8*  NEUTROABS 8.3*  HGB 9.8*  HCT 29.6*  MCV 96.4  PLT 287    CBG: Recent Labs  Lab 10/17/20 0856 10/17/20 1151 10/17/20 1650 10/17/20 2012 10/18/20 0007  GLUCAP 109* 184* 117*  141* 196*   Family history.  Father with myocardial infarction as well as COPD.  Mother with CAD.  Denies any colon cancer esophageal cancer or rectal cancer  Brief HPI:   TRISTAN PROTO is a 55 y.o. 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  Per chart review lives with spouse.  Presented 09/11/2020 with acute onset of right-sided weakness and dysarthria.  Admission chemistries unremarkable alcohol negative urine drug screen positive cocaine.  Cranial CT scan negative.  CTA head and neck negative for emergent large vessel occlusion.  MRI showed 2 adjacent acute infarcts within the right cingulate gyrus/colossal body right ACA vascular territory as well as a 6 mm acute infarct within the right parietal lobe subcortical white matter and questionable punctate acute infarct within the left frontal lobe subcortical white matter.  Echocardiogram with ejection fraction 65 to 70% no wall motion abnormalities.  Maintained on aspirin and Plavix  for CVA prophylaxis.  Subcutaneous Lovenox for DVT prophylaxis.  Patient was n.p.o. with alternative means of nutritional support with gastrostomy tube placed 09/24/2020 per interventional radiology Dr. Deanne Coffer.  Hospital course complicated by sepsis secondary to aspiration pneumonia completed 14-day course of antibiotics.  Palliative care consulted to establish goals of care.  Patient accidentally stretched PEG tube which initially held up his admission to inpatient rehab services.  Therapy evaluations completed due to patient's right side weakness dysarthria was admitted for a comprehensive rehab program.   Hospital Course: BACH ROCCHI was admitted to rehab 09/27/2020 for inpatient therapies to consist of PT, ST and OT at least three hours five days a week. Past admission physiatrist, therapy team and rehab RN have worked together to provide customized collaborative inpatient rehab.  Pertain to patient's 2 adjacent acute infarcts right cingulate gyrus/colossal body right ACA territory vascular as well as infarct right parietal lobe left frontal subcortical white matter.  Patient also with history of prior CVA 07/2020.  He remained on aspirin and Plavix initially but transition to Plavix alone due to some melanotic stool.Marland Kitchen  He was initially on subcutaneous Lovenox for DVT prophylaxis discontinued due to some bleeding around his PEG tube.  He remained NPO until 10/18/2020 Diet advanced to mechanical soft pudding thick liquids.  Patient would remain on tube feeds could be adjusted based on patient's p.o. intake.  Mood stabilization with the use of Xanax as needed as well as scheduled Lexapro with Tegretol.  He was using Seroquel at bedtime.  Blood sugars monitored with hemoglobin A1c 6.4 insulin therapy as directed patient did have a positive urine drug screen on admission for cocaine receiving counsel guards to cessation of illicit drug products as well as tobacco.  History of hyperlipidemia with Crestor and  fenofibrate as indicated.  Acute blood loss anemia stable latest hemoglobin 9.8.  During patient's hospital course she did have some melanotic stool with gastroenterology services consulted with upper GI endoscopy unremarkable.   Blood pressures were monitored on TID basis and soft and monitored  Diabetes has been monitored with ac/hs CBG checks and SSI was use prn for tighter BS control.    Rehab course: During patient's stay in rehab weekly team conferences were held to monitor patient's progress, set goals and discuss barriers to discharge. At admission, patient required moderate assist side-lying to sitting moderate assist sit to stand max assist upper lower body bathing  Physical exam.  Blood pressure 135/85 pulse 105 temperature 97.7 respirations 18 oxygen saturation 92% room air Constitutional.  Restless agitated HEENT Head.  Normocephalic and atraumatic Eyes.  Pupils round and reactive to light no discharge nystagmus Neck.  Supple nontender no JVD without thyromegaly Cardiac regular rate rhythm without any extra sounds or murmur heard Respiratory effort normal no respiratory distress without wheeze Abdomen.  Gastrostomy tube in place dressed with abdominal binder minimal blood around tube Neurologic.  Follows basic commands makes eye contact with examiner speech was dysarthric he does provide his name and follow simple commands.  Left lower extremity 3/5 decreased sensation throughout left upper extremity tremor present.  Poor safety awareness and impulsive  He/She  has had improvement in activity tolerance, balance, postural control as well as ability to compensate for deficits. He/She has had improvement in functional use RUE/LUE  and RLE/LLE as well as improvement in awareness.  Steady transfer to wheelchair with max assist from elevated bed height.  Minimal assist for sitting balance.  Total assist donning pants and shoes.  He required moderate assist for bed mobility.  Due to  limited advances skilled nursing facility was recommended       Disposition: Discharged to skilled nursing facility    Diet: Mechanical soft pudding thick liquids  Osmolite by PEG tube 355 mL 4 times daily.  Continue to modified tube feeds based on patient's p.o. intake Free water 230 mL 4 times daily by PEG tube  Special Instructions: No smoking alcohol or illicit drug use  Medications at discharge. 1.  Tylenol 650 mg every 4 hours as needed per tube 2.  Xanax 0.25 mg per tube 3 times daily as needed 3.  Tegretol 200 mg every 6 hours per tube 4.  Lexapro 10 mg daily by tube 5.  Fenofibrate 160 mg daily by tube 6.  Hydrocodone 1 tablet daily by tube as needed 7.  Levemir 6 units twice daily 8.  Lidoderm patch change as directed 9.  Protonix 40 mg twice daily by tube 10.  MiraLAX 17 g daily by tube 11.  Inderal 10 mg twice daily by tube 12.  Seroquel 100 mg per tube nightly 13.  Crestor 20 mg daily by tube 14.  Plavix 75 mg daily by tube 15.  Nitroglycerin as needed chest pain 16.  Albuterol inhaler 2 puffs every 6 hours as needed   30-35 minutes were spent completing discharge summary and discharge planning  Discharge Instructions     Ambulatory referral to Neurology   Complete by: As directed    An appointment is requested in approximately: 4 weeks right cingulate gyrus/right ACA   Ambulatory referral to Physical Medicine Rehab   Complete by: As directed    Follow-up 1 month patient for skilled nursing facility.  Right cingulate gyrus right ACA territory infarction        Contact information for follow-up providers     Swartz, Zachary T, MD Follow up.   Specialty: Physical Medicine and Rehabilitation Why: office to call for appointment Ranelle Oysterontact information: [address] [place] [address] [phone]         Jenel Lucksunningham, Scott E, MD Follow up.   Specialty: Gastroenterology Why: call for appointment as needed Contact information: [address] [place] [id number] [phone]              Contact information for after-discharge care     Destination     [facility] .  Service: Skilled Nursing Contact information: [address] [place] [address] [phone]                     Signed: Charlton Amor 10/18/2020, 5:23 AM

## 2020-10-17 NOTE — Progress Notes (Signed)
Physical Therapy Session Note  Patient Details  Name: Brent [name] MRN: [id number] Date of Birth: 1965-07-27  Today's Date: 10/17/2020 PT Individual Time: 0802-0900 PT Individual Time Calculation (min): 58 min   Short Term Goals:  Week 3:  PT Short Term Goal 1 (Week 3): Pt will perform chair <>bed transfers w/max A x1 consistently PT Short Term Goal 2 (Week 3): Pt will demonstrate improved sitting balance with ability to maintain static sitting for at least 2 minutes without UE support PT Short Term Goal 3 (Week 3): Pt will sustain attention to task for 2 minutes at a time   Skilled Therapeutic Interventions/Progress Updates:   Pt received supine in bed and agreeable to PT. Pt asking impulsively to get out of bed. PT required to provide repeated instruction for removal of wrist restraints and donning of pants and shoes prior to sitting up. Total A donning pants and shoes with rolling R and L with mod assist to pull to waist. Sitting balance EOB x 5 minutes with mod-max assist due to constant posterior bias while donning shirt with max A using hemi technique.   Stedy transfer to [facility] with max A from elevated bed height. Min assist for sitting balance once in Nustep with RUE  and LUE support on rail  Transported to rail outside PT gym sit<>stand with max assist x 4 with visual feeback from mirror PT required to block knee and factilitate weight shift over the LLE. Marland Kitchen   Pregait forward/reverse 3 steps BLE x 2 with max assist to block LLE in stance as well as improve knee flexion with limb advancement. Gait training at rail in hall x 50f with max A for management of the LLE and +2 for WC follow. Cues for attention to task and use of visual feedback of mirror to remain in midline.   Nustep reciprocal movement training BLE only x 6 minutes. Mod-max assist for initiation of hip extension throughout. MD present at 4 minute mark to preform assessment and pt took therapeutic rest break while  talking to MD.  Squat pivot transfers to and from nustep with Max assist and LLE blocked.   Patient returned to RN station, and [facility], seat alarm seat and staff aware of pt position.         Therapy Documentation Precautions:  Precautions Precautions: Fall, Other (comment) Precaution Comments: supplemental O2 via [place], L hemi, impaired safety awareness, impulsive, NPO Restrictions Weight Bearing Restrictions: No   Pain: Pain Assessment Pain Scale: 0-10 Pain Score: 8  Pain Location: Back Pain Orientation: Lower Pain Intervention(s): Medication (See eMAR)    Therapy/Group: Individual Therapy  ALorie Phenix9/02/2020, 9:04 AM

## 2020-10-17 NOTE — Plan of Care (Signed)
  Problem: Consults Goal: RH STROKE PATIENT EDUCATION Description: See Patient Education module for education specifics  Outcome: Progressing Goal: Nutrition Consult-if indicated Outcome: Progressing Goal: Diabetes Guidelines if Diabetic/Glucose > 140 Description: If diabetic or lab glucose is > 140 mg/dl - Initiate Diabetes/Hyperglycemia Guidelines & Document Interventions  Outcome: Progressing   Problem: RH BLADDER ELIMINATION Goal: RH STG MANAGE BLADDER WITH ASSISTANCE Description: STG Manage Bladder With min Assistance Outcome: Progressing   Problem: RH SKIN INTEGRITY Goal: RH STG MAINTAIN SKIN INTEGRITY WITH ASSISTANCE Description: STG Maintain Skin Integrity With min Assistance. Outcome: Progressing   Problem: RH SAFETY Goal: RH STG ADHERE TO SAFETY PRECAUTIONS W/ASSISTANCE/DEVICE Description: STG Adhere to Safety Precautions With cues and reminders. Outcome: Progressing Goal: RH STG DECREASED RISK OF FALL WITH ASSISTANCE Description: STG Decreased Risk of Fall With min Assistance. Outcome: Progressing   Problem: RH PAIN MANAGEMENT Goal: RH STG PAIN MANAGED AT OR BELOW PT'S PAIN GOAL Description: < 3 on a 0-10 pain scale. Outcome: Progressing   Problem: RH KNOWLEDGE DEFICIT Goal: RH STG INCREASE KNOWLEDGE OF DIABETES Description: Patient will demonstrate knowledge of diabetes medications, insulin management, blood sugar control with educational materials and handouts provided by staff independently at discharge. Outcome: Progressing Goal: RH STG INCREASE KNOWLEDGE OF STROKE PROPHYLAXIS Description: Patient will demonstrate knowledge of secondary stroke prevention medication management with educational materials and handouts provided by staff independently at discharge. Outcome: Progressing

## 2020-10-17 NOTE — Progress Notes (Signed)
This chaplain responded to the Pt. consult for spiritual care.    The chaplain educated the Pt. RN-Ashley on how to page spiritual care between the hours of 5pm-8:30am for emergent needs ([phone]).  The chaplain has found from previous Pt. visits the Pt. may be comforted by a chaplain visit.  The chaplain offered reflective listening as the Pt. looks forward to [name]'s visit today and the removal of the wrist restraints.  The chaplain understands the  Pt. wants to talk about going home with Clydie Braun and being close to family.   This chaplain and spiritual care team is available for F/U spiritual care as needed.

## 2020-10-17 NOTE — Progress Notes (Signed)
PA Dan notified that pt G tube is leaking from the tube. No drainage noted around stoma. G tube secured with abdominal binder. No new orders at this time.

## 2020-10-17 NOTE — Progress Notes (Signed)
[facility] PHYSICAL MEDICINE & REHABILITATION PROGRESS NOTE  Subjective/Complaints: Found him with PT. Asked if he can have wrist restraints removed. Says he won't pull on PEG  ROS: Patient denies fever, rash, sore throat, blurred vision, nausea, vomiting, diarrhea, cough, shortness of breath or chest pain, joint or back pain, headache, or mood change.   Objective: Vital Signs: Blood pressure (!) 155/89, pulse 70, temperature 98.8 F (37.1 C), temperature source Oral, resp. rate 18, height 6' (1.829 m), weight 81.4 kg, SpO2 96 %. No results found. No results for input(s): WBC, HGB, HCT, PLT in the last 72 hours.  No results for input(s): NA, K, CL, CO2, GLUCOSE, BUN, CREATININE, CALCIUM in the last 72 hours.   Intake/Output Summary (Last 24 hours) at 10/17/2020 1105 Last data filed at 10/16/2020 1853 Gross per 24 hour  Intake 0 ml  Output --  Net 0 ml           Physical Exam: BP (!) 155/89 (BP Location: Right Arm)   Pulse 70   Temp 98.8 F (37.1 C) (Oral)   Resp 18   Ht 6' (1.829 m)   Wt 81.4 kg   SpO2 96%   BMI 24.34 kg/m  Constitutional: No distress . Vital signs reviewed. HEENT: NCAT, EOMI, oral membranes moist Neck: supple Cardiovascular: RRR without murmur. No JVD    Respiratory/Chest: CTA Bilaterally without wheezes or rales. Normal effort    GI/Abdomen: BS +, non-tender, non-distended, PEG site clean Ext: no clubbing, cyanosis, or edema Psych: pleasant and cooperative, sl impulsive Musc: No edema in extremities.  No tenderness in extremities. Neuro: Alert and oriented x2 Dysarthria, stable LUE: Shoulder abduction, elbow flexion/extension 3/5, handgrip 3/5, improving LLE: 3-/5 proximal distal--no changes  Assessment/Plan: 1. Functional deficits which require 3+ hours per day of interdisciplinary therapy in a comprehensive inpatient rehab setting. Physiatrist is providing close team supervision and 24 hour management of active medical problems listed  below. Physiatrist and rehab team continue to assess barriers to discharge/monitor patient progress toward functional and medical goals   Care Tool:  Bathing    Body parts bathed by patient: Abdomen, Chest, Face, Right arm, Left arm   Body parts bathed by helper:  (pt declined bathing all other body parts) Body parts n/a: Front perineal area, Buttocks   Bathing assist Assist Level: Moderate Assistance - Patient 50 - 74%     Upper Body Dressing/Undressing Upper body dressing   What is the patient wearing?: Pull over shirt    Upper body assist Assist Level: Moderate Assistance - Patient 50 - 74%    Lower Body Dressing/Undressing Lower body dressing      What is the patient wearing?: Pants     Lower body assist Assist for lower body dressing: Maximal Assistance - Patient 25 - 49%     Toileting Toileting Toileting Activity did not occur (Clothing management and hygiene only): N/A (no void or bm)  Toileting assist Assist for toileting: 2 Helpers     Transfers Chair/bed transfer  Transfers assist  Chair/bed transfer activity did not occur: Safety/medical concerns  Chair/bed transfer assist level: 2 Helpers     Locomotion Ambulation   Ambulation assist   Ambulation activity did not occur: Safety/medical concerns          Walk 10 feet activity   Assist  Walk 10 feet activity did not occur: Safety/medical concerns        Walk 50 feet activity   Assist Walk 50 feet with 2 turns  activity did not occur: Safety/medical concerns         Walk 150 feet activity   Assist Walk 150 feet activity did not occur: Safety/medical concerns         Walk 10 feet on uneven surface  activity   Assist Walk 10 feet on uneven surfaces activity did not occur: Safety/medical concerns         Wheelchair     Assist Is the patient using a wheelchair?: Yes Type of Wheelchair: Manual Wheelchair activity did not occur: Safety/medical concerns          Wheelchair 50 feet with 2 turns activity    Assist    Wheelchair 50 feet with 2 turns activity did not occur: Safety/medical concerns       Wheelchair 150 feet activity     Assist  Wheelchair 150 feet activity did not occur: Safety/medical concerns        Medical Problem List and Plan: 1.   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 Hx of prior stroke 6/22.  Continue CIR, SNF pending 2.  Impaired mobility: Discontinue Lovenox given bleeding from PEG site.              -antiplatelet therapy: Has completed 3 weeks of Plavix, continue Aspirin 81mg  daily.  3. Low back pain: d/c IV Dilaudid. Discontinue Norco. Added lidocaine patch for lower back.   Controlled on 9/1 4. Anxiety: Continue Xanax as needed. Increased seroquel to 25mg  HS. Psych consulted as per mother's request. Can consult chaplain at night as well if patient is agitated- they have 247 service  8/29- continued suicidal ideations 9/1 mood overall improved. Can become a little anxious and restless at times.  -continue seroquel at night with melatonin  -lexapro qd, propranolol, xanax prn 5. Neuropsych: This patient is not capable of making decisions on his own behalf.   Continue tele sitter for safety    Will try without restraints when possible. remove when family here 6. Skin/Wound Care: Routine skin checks 7. 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 9. Cocaine/tobacco abuse.UDS positive cocaine.  Counseled on appropriate 10.  Diabetes mellitus.  Hemoglobin A1c 6.4.   Diabetic teaching when appropriate  Levemir  decreased to 8 on 8/17  Discontinue Novolog- no more hypoglycemic episodes after this CBG (last 3)  Recent Labs    10/17/20 0007 10/17/20 0414 10/17/20 0856  GLUCAP 159* 116* 109*  Increased Levemir to 6U  9/1 fair control   11.  Hyperlipidemia.  Crestor/fenofibrate 12. Aspiration pneumonia.  Completed 14 days antibiotic 13.  Constipation.  MiraLAX 14.  Acute blood loss anemia  Hemoglobin 9.8 on 8/29 Plavix resumed as per GI recommendations. Continue PPI indefinitely. Repeat CBC periodically.   Endoscopy shows no ulcers 15.  Leukocytosis  WBCs 10.8 on 8/29  Afebrile 16.  AKI: Resolved 17. Hypernatremia: Sodium 139 on 8/16, improved 18. HTN: started propanolol 10mg  daily.   Vitals:   10/16/20 2003 10/17/20 0420  BP: (!) 142/93 (!) 155/89  Pulse: 84 70  Resp: 20 18  Temp: 98.6 F (37 C) 98.8 F (37.1 C)  SpO2: 97% 96%  9/1 borderline high, increase propranolol to bid  LOS: 20  days A FACE TO FACE EVALUATION WAS PERFORMED  Ranelle Oyster 10/17/2020, 11:05 AM

## 2020-10-17 NOTE — Progress Notes (Signed)
Occupational Therapy Session Note  Patient Details  Name: Brent [name] MRN: [id number] Date of Birth: Jun 24, 1965  Today's Date: 10/17/2020 OT Individual Time: 1100-1150 OT Individual Time Calculation (min): 50 min  10 mins missed d/t pain    Short Term Goals: Week 3:  OT Short Term Goal 1 (Week 3): Pt will don shirt with min A and min cues actively focusing on his LUE. OT Short Term Goal 2 (Week 3): Pt will consistently sit to stand with mod A to prep for LB self care.  Skilled Therapeutic Interventions/Progress Updates:  Pt greeted  in w/c handed of from NT. Pt agreeable to OT intervention. Session focus on BADL reeducation and functional mobility. Pt completed wash up at sink from w/c level with pt needing MOD A for UB bathing. Pt needed hand over hand assist to wash underneath R arm with L hand. Education provided on hemi techniques for bathing however little carryover noted. Pt rquired MAX A for UB dressing with OH shirt. Pt completed sit<>stand in stedy for LB Dressing with MAX A, once in standing pt was abel to stand with BUE support with MIN A while pt donned brief in standing. Pt completed oral care at sink with set-up - supervision assist for safety. Pt noted to become emotional adamantly requesting to return to bed d/t pain. Pt completed dependent tranfer back to bed from stedy, MIN A to stand from seat of stedy.  MAX a to return to supine. pt left supine in bed with bed alarm activated, wrist restraints applied and all needs within reach.                        Therapy Documentation Precautions:  Precautions Precautions: Fall, Other (comment) Precaution Comments: supplemental O2 via [place], L hemi, impaired safety awareness, impulsive, NPO Restrictions Weight Bearing Restrictions: No General: General OT Amount of Missed Time: 10 Minutes  Pain: Pt reports pain in bilateral feet, called RN for pain meds. Provides rest breaks and redirection/ repositioning for pain mgmt  strategies.     Therapy/Group: Individual Therapy  Pollyann Glen [facility] 10/17/2020, 12:22 PM

## 2020-10-17 NOTE — Progress Notes (Signed)
Occupational Therapy Session Note  Patient Details  Name: Brent [name] MRN: [id number] Date of Birth: 03/29/1965  Today's Date: 10/17/2020 OT Individual Time: 1301-1330 OT Individual Time Calculation (min): 29 min    Short Term Goals: Week 3:  OT Short Term Goal 1 (Week 3): Pt will don shirt with min A and min cues actively focusing on his LUE. OT Short Term Goal 2 (Week 3): Pt will consistently sit to stand with mod A to prep for LB self care.  Skilled Therapeutic Interventions/Progress Updates:  Pt greeted supine in bed agreeable to OT intervention. Pt reports "my old lady is coming up here so I need to be in the chair." Pt required MOD A for bed mobility with pt needing step by step cues to sequence task as pt wants this OTA to lift him up. Increased time and encouragement provided to transition from supine>sitting. Pts wife and sister-in-law (SIL) enter at this time. Pt needed MAX cue to attend to task as pt wanting his wife and SIL to then lift him to the chair. Pt required total A for squat pivot transfer from EOB >w/c with pt going towards pts L side.Education provided on pts current level of assist to pts wife. Pt completed therex from sitting up in chair with un weighted dowel rod with LUE secured to dowel rod. Pt completed x10 chest presses, rows forward<>backward and bicep curls x5 reps each as pt more drowsy this session with pt noted to nod off during session. Pt left seated in w/c with alarm belt activated and family present.    Therapy Documentation Precautions:  Precautions Precautions: Fall, Other (comment) Precaution Comments: supplemental O2 via [place], L hemi, impaired safety awareness, impulsive, NPO Restrictions Weight Bearing Restrictions: No General: General OT Amount of Missed Time: 10 Minutes  Pain: Pt reports pain in bilateral feet during session, offered rest breaks and repositioning as pain mgmt strategy.    Therapy/Group: Individual Therapy  Barron Schmid 10/17/2020, 2:38 PM

## 2020-10-18 ENCOUNTER — Inpatient Hospital Stay (HOSPITAL_COMMUNITY): Payer: Medicaid Other

## 2020-10-18 DIAGNOSIS — I69391 Dysphagia following cerebral infarction: Secondary | ICD-10-CM

## 2020-10-18 LAB — GLUCOSE, CAPILLARY
Glucose-Capillary: 170 mg/dL — ABNORMAL HIGH (ref 70–99)
Glucose-Capillary: 196 mg/dL — ABNORMAL HIGH (ref 70–99)
Glucose-Capillary: 79 mg/dL (ref 70–99)
Glucose-Capillary: 91 mg/dL (ref 70–99)

## 2020-10-18 NOTE — Progress Notes (Signed)
Speech Language Pathology Discharge Summary  Patient Details  Name: Brent [name] MRN: [id number] Date of Birth: February 27, 1965  Today's Date: 10/18/2020 SLP Individual Time: 0845-0900 SLP Individual Time Calculation (min): 15 min   Skilled Therapeutic Interventions:  MBS completed today, please see report in chart.     Patient has met 6 of 7 long term goals.  Patient to discharge at overall Mod;Min level.  Reasons goals not met: Patient requiring min-modA for basic level problem solving   Clinical Impression/Discharge Summary: Patient met 6 of 7 LTG's and at time of discharge is in the range of min-modA overall for basic problem solving, minA for very basic reasoning, safety awareness and memory, recall. He requires full supervision due to impulsivity, poor awareness and poor compliance. Patient also exhibits agitation and behaviors such as pulling at PEG tube, pulling out IV lines, etc. Patient himself does admit to doing such thing and he himself reports that at times he does it "for attention".  MBS was completed on date of discharge 9/2 and recommendation was for Dys 3 solids, pudding thick liquids. (Please see MBS report from 9/2). SLP intervention at next venue of care (SNF) recommended for severe dysphagia, and moderately impaired cognition.  Care Partner:  Caregiver Able to Provide Assistance: No (Patient discharging to SNF)  Type of Caregiver Assistance: Physical;Cognitive  Recommendation:  Skilled Nursing facility  Rationale for SLP Follow Up: Maximize swallowing safety;Maximize cognitive function and independence;Reduce caregiver burden   Equipment: none for ST   Reasons for discharge: Discharged from hospital   Patient/Family Agrees with Progress Made and Goals Achieved: Yes    Sonia Baller, MA, CCC-SLP Speech Therapy

## 2020-10-18 NOTE — Progress Notes (Signed)
Brent [name] PHYSICAL MEDICINE & REHABILITATION PROGRESS NOTE  Subjective/Complaints: Patient seen laying in bed this morning.  He states he did not sleep well overnight.  He asks when he can leave.  ROS: Denies CP, SOB, N/V/D  Objective: Vital Signs: Blood pressure 128/85, pulse 73, temperature 98.6 F (37 C), resp. rate 20, height 6' (1.829 m), weight 81.4 kg, SpO2 94 %. DG Swallowing Func-Speech Pathology  Result Date: 10/18/2020 Table formatting from the original result was not included. Objective Swallowing Evaluation: Type of Study: MBS-Modified Barium Swallow Study  Patient Details Name: Brent [name] MRN: [id number] Date of Birth: [birth year] Today's Date: 10/18/2020 Time: SLP Start Time (ACUTE ONLY): 1538 -SLP Stop Time (ACUTE ONLY): 1608 SLP Time Calculation (min) (ACUTE ONLY): 30 min Past Medical History: Past Medical History: Diagnosis Date  Aplastic anemia, unspecified (HCC)   Chest pain, unspecified   Chronic airway obstruction, not elsewhere classified   Cocaine substance abuse (HCC)   Neuropathy   Other and unspecified hyperlipidemia   Shortness of breath   Stroke (HCC) 06/17/2020  Tobacco use disorder   Type II or unspecified type diabetes mellitus without mention of complication, not stated as uncontrolled   Unspecified epilepsy without mention of intractable epilepsy   Unspecified essential hypertension  Past Surgical History: Past Surgical History: Procedure Laterality Date  BIOPSY  10/02/2020  Procedure: BIOPSY;  Surgeon: Jenel Lucks, MD;  Location: [facility] ENDOSCOPY;  Service: Gastroenterology;;  CARDIAC CATHETERIZATION  10/02/2010   Nonobstructive mild coronary plaque  ESOPHAGOGASTRODUODENOSCOPY N/A 10/02/2020  Procedure: ESOPHAGOGASTRODUODENOSCOPY (EGD);  Surgeon: Jenel Lucks, MD;  Location: [facility] ENDOSCOPY;  Service: Gastroenterology;  Laterality: N/A;  IR GASTROSTOMY TUBE MOD SED  09/24/2020  KNEE SURGERY Bilateral 2018  Repair  PERFORATED VISCUS SURGERY    MULTIPLE FRACTURES  HPI: Pt is a 55 y/o male who presented to the ED with SOB and was noted to have L hemiparesis and significant dysarthria. Patient admitted to [facility] with acute respiratory failure with hypoxia secondary to PNA, COPD exacerbation and sepsis. Imaging revealed R ACA CVA x2 within R cingulate gyrus/callosal body, R parietal lobe subcortical white matter infarct and questionable acute punctate infarct within L frontal lobe subcortical white matter. Transferred to ICU at [facility] 7/31 due to ongoing respiratory failure with complete opacification of left hemithorax. PMH: cocaine and tobacco use disorder, lumbago, HTN, HLD, DMII and Hx of CVA. Pt with known dysphagia (since first MBS 11/2018) and noncompliance with recommendations. MBS completed in June with recommendation for regular textures and honey thick liquids. MBS 7/28: oropharyngeal dysphagia with consistent small amounts of aspiration which are not cleared by pt following weak delayed cough. despite. Weak bolus manipulation, reduced bolus cohesion, a pharyngeal delay, and reduced laryngeal vestibule closure. An NPO status with alternate means of nutrition was recommended at that time. Patient had PEG placement 8/9.  Subjective: "I know what I need to do." (stop drugs) Assessment / Plan / Recommendation CHL IP CLINICAL IMPRESSIONS 10/18/2020 Clinical Impression Patient presents with mild improvement in swallow function as compared to MBS 2 weeks ago. SLP introduced puree solids then dysphagia 3 solids prior to honey thick liquids to ensure no honey liquid residuals were present. Patient exhibited delayed oral phase with all consistencies and although mastication and oral transit of dysphagia 3 solids was decreased, he was able to fully masticate solids prior to initiating swallow. Swallow initiation was delayed to level of vallecular sinus with puree solids and dysphagia 3 solids and to level of pyriform sinus with honey  thick liquids. Silent aspiration occured prior to  swallow with cup sip of honey liquids as well as 2 of approximately 7 spoon sips of honey thick liquids and flash penetration of honey thick liquids occured with majority of all swallows. Patient did demonstrate an eventual cough however aspiration had already occured and cough was too late/ineffective to clear any of aspirate. Trace pharyngeal residuals observed with honey thick liquids in pyriform sinus which cleared with subsequent swallows and no significant amount of pharyngeal residuals observed with puree solids or dysphagia 3 solids. SLP is recommending Dys 3 solids, pudding thick liquids and full supervision with PO intake due to impulsivity and poor awareness. Do not anticipate patient would be able to advance with liquids or solids much beyond current recommendations but objective swallow assessment would be necessry prior to any advancements. SLP Visit Diagnosis -- Attention and concentration deficit following -- Frontal lobe and executive function deficit following -- Impact on safety and function --   CHL IP TREATMENT RECOMMENDATION 09/12/2020 Treatment Recommendations Therapy as outlined in treatment plan below   Prognosis 10/18/2020 Prognosis for Safe Diet Advancement Guarded Barriers to Reach Goals Severity of deficits;Time post onset Barriers/Prognosis Comment -- CHL IP DIET RECOMMENDATION 10/18/2020 SLP Diet Recommendations Dysphagia 3 (Mech soft) solids;Pudding thick liquid Liquid Administration via Spoon Medication Administration Crushed with puree Compensations Minimize environmental distractions;Slow rate;Small sips/bites Postural Changes Seated upright at 90 degrees   CHL IP OTHER RECOMMENDATIONS 10/18/2020 Recommended Consults -- Oral Care Recommendations Oral care BID Other Recommendations Prohibited food (jello, ice cream, thin soups);Clarify dietary restrictions   CHL IP FOLLOW UP RECOMMENDATIONS 09/25/2020 Follow up Recommendations [facility] D[facility]  09/12/2020 Speech Therapy Frequency (ACUTE ONLY) min 3x week Treatment Duration 1 week      CHL IP ORAL PHASE 10/18/2020 Oral Phase Impaired Oral - Pudding Teaspoon -- Oral - Pudding Cup -- Oral - Honey Teaspoon Delayed oral transit;Weak lingual manipulation;Reduced posterior propulsion;Premature spillage Oral - Honey Cup Weak lingual manipulation;Reduced posterior propulsion;Premature spillage Oral - Nectar Teaspoon NT Oral - Nectar Cup NT Oral - Nectar Straw -- Oral - Thin Teaspoon -- Oral - Thin Cup -- Oral - Thin Straw -- Oral - Puree Weak lingual manipulation;Reduced posterior propulsion;Delayed oral transit Oral - Mech Soft Weak lingual manipulation;Reduced posterior propulsion;Impaired mastication;Delayed oral transit Oral - Regular NT Oral - Multi-Consistency -- Oral - Pill -- Oral Phase - Comment --  CHL IP PHARYNGEAL PHASE 10/18/2020 Pharyngeal Phase -- Pharyngeal- Pudding Teaspoon -- Pharyngeal -- Pharyngeal- Pudding Cup -- Pharyngeal -- Pharyngeal- Honey Teaspoon -- Pharyngeal -- Pharyngeal- Honey Cup -- Pharyngeal Material enters airway, remains ABOVE vocal cords then ejected out;Material enters airway, passes BELOW cords without attempt by patient to eject out (silent aspiration) Pharyngeal- Nectar Teaspoon -- Pharyngeal -- Pharyngeal- Nectar Cup -- Pharyngeal -- Pharyngeal- Nectar Straw -- Pharyngeal -- Pharyngeal- Thin Teaspoon -- Pharyngeal -- Pharyngeal- Thin Cup -- Pharyngeal -- Pharyngeal- Thin Straw -- Pharyngeal -- Pharyngeal- Puree -- Pharyngeal Material does not enter airway Pharyngeal- Mechanical Soft -- Pharyngeal -- Pharyngeal- Regular -- Pharyngeal -- Pharyngeal- Multi-consistency -- Pharyngeal -- Pharyngeal- Pill -- Pharyngeal -- Pharyngeal Comment --  CHL IP CERVICAL ESOPHAGEAL PHASE 10/18/2020 Cervical Esophageal Phase WFL Pudding Teaspoon -- Pudding Cup -- Honey Teaspoon -- Honey Cup -- Nectar Teaspoon -- Nectar Cup -- Nectar Straw -- Thin Teaspoon -- Thin Cup -- Thin Straw -- Puree --  Mechanical Soft -- Regular -- Multi-consistency -- Pill -- Cervical Esophageal Comment -- Jonny RuizJohn  Anders Grant, MA, CCC-SLP Speech Therapy             No results for input(s): WBC, HGB, HCT, PLT in the last 72 hours.  No results for input(s): NA, K, CL, CO2, GLUCOSE, BUN, CREATININE, CALCIUM in the last 72 hours.   Intake/Output Summary (Last 24 hours) at 10/18/2020 1120 Last data filed at 10/18/2020 0942 Gross per 24 hour  Intake 40981 ml  Output --  Net 11040 ml       Physical Exam: BP 128/85   Pulse 73   Temp 98.6 F (37 C)   Resp 20   Ht 6' (1.829 m)   Wt 81.4 kg   SpO2 94%   BMI 24.34 kg/m  Constitutional: No distress . Vital signs reviewed. HENT: Normocephalic.  Atraumatic. Eyes: EOMI. No discharge. Cardiovascular: No JVD.  RRR. Respiratory: Normal effort.  No stridor.  Bilateral clear to auscultation. GI: Non-distended.  BS +.  + PEG. Skin: Warm and dry.  Intact. Psych: Normal mood.  Normal behavior. Musc: No edema in extremities.  No tenderness in extremities. Neuro: Alert Dysarthria, stable LUE: Shoulder abduction, elbow flexion/extension 3/5, handgrip 3/5, improving LLE: 3-/5 proximal distal, improving  Assessment/Plan: 1. Functional deficits which require 3+ hours per day of interdisciplinary therapy in a comprehensive inpatient rehab setting. Physiatrist is providing close team supervision and 24 hour management of active medical problems listed below. Physiatrist and rehab team continue to assess barriers to discharge/monitor patient progress toward functional and medical goals   Care Tool:  Bathing    Body parts bathed by patient: Abdomen, Chest, Face, Right arm, Left arm   Body parts bathed by helper: Front perineal area, Buttocks, Right upper leg, Left upper leg, Right lower leg, Left lower leg Body parts n/a: Front perineal area, Buttocks   Bathing assist Assist Level: Maximal Assistance - Patient 24 - 49%     Upper Body Dressing/Undressing Upper  body dressing   What is the patient wearing?: Pull over shirt    Upper body assist Assist Level: Maximal Assistance - Patient 25 - 49%    Lower Body Dressing/Undressing Lower body dressing      What is the patient wearing?: Pants, Underwear/pull up     Lower body assist Assist for lower body dressing: Maximal Assistance - Patient 25 - 49%     Toileting Toileting Toileting Activity did not occur (Clothing management and hygiene only): N/A (no void or bm)  Toileting assist Assist for toileting: 2 Helpers     Transfers Chair/bed transfer  Transfers assist  Chair/bed transfer activity did not occur: Safety/medical concerns  Chair/bed transfer assist level: 2 Helpers     Locomotion Ambulation   Ambulation assist   Ambulation activity did not occur: Safety/medical concerns          Walk 10 feet activity   Assist  Walk 10 feet activity did not occur: Safety/medical concerns        Walk 50 feet activity   Assist Walk 50 feet with 2 turns activity did not occur: Safety/medical concerns         Walk 150 feet activity   Assist Walk 150 feet activity did not occur: Safety/medical concerns         Walk 10 feet on uneven surface  activity   Assist Walk 10 feet on uneven surfaces activity did not occur: Safety/medical concerns         Wheelchair     Assist Is the patient using a wheelchair?:  Yes Type of Wheelchair: Manual Wheelchair activity did not occur: Safety/medical concerns         Wheelchair 50 feet with 2 turns activity    Assist    Wheelchair 50 feet with 2 turns activity did not occur: Safety/medical concerns       Wheelchair 150 feet activity     Assist  Wheelchair 150 feet activity did not occur: Safety/medical concerns        Medical Problem List and Plan: 1.   Left side hemiparesis and dysarthria secondary to 2 adjacent acute infarctions in the right cingulate gyrus/colossal body(right ACA vascular  territory) as well as infarct right parietal lobe and left frontal subcortical white matter infarct. Hx of prior stroke 6/22.  Plan for d/c to SNF today 2.  Impaired mobility: Discontinue Lovenox given bleeding from PEG site.              -antiplatelet therapy: Has completed 3 weeks of Plavix, continue Aspirin 81mg  daily.  3. Low back pain: d/c IV Dilaudid. Discontinue Norco. Added lidocaine patch for lower back.   Controlled on 9/2 4. Anxiety: Continue Xanax as needed. Increased seroquel to 25mg  HS. Psych consulted as per mother's request. Can consult chaplain at night as well if patient is agitated- they have 247 service  Had suicidal ideations 9/1 mood overall improved. Can become a little anxious and restless at times.  -continue seroquel at night with melatonin  -lexapro qd, propranolol, xanax prn Stable 5. Neuropsych: This patient is not capable of making decisions on his own behalf.   Continue telesitter for safety    Will try without restraints when possible. remove when family here 6. Skin/Wound Care: Routine skin checks 7. 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-Continue routine PEG care  -SLP follow up, continue to advance diet as tolerated Remains NPO, MBS today 9. Cocaine/tobacco abuse.UDS positive cocaine.  Counseled on appropriate 10.  Diabetes mellitus.  Hemoglobin A1c 6.4.   Diabetic teaching when appropriate  Levemir decreased to 8 on 8/17  Discontinue Novolog- no more hypoglycemic episodes after this CBG (last 3)  Recent Labs    10/18/20 0007 10/18/20 0357 10/18/20 0817  GLUCAP 196* 79 91   Increased Levemir to 6U  Stable on 10/1998 monitor in ambulatory setting with potential further adjustments as necessary   11.  Hyperlipidemia.  Crestor/fenofibrate 12. Aspiration pneumonia.  Completed 14 days antibiotic 13.  Constipation.  MiraLAX 14.  Acute blood  loss anemia  Hemoglobin 9.8 on 8/29 Plavix resumed as per GI recommendations. Continue PPI indefinitely. Repeat CBC periodically.   Endoscopy shows no ulcers 15.  Leukocytosis  WBCs 10.8 on 8/29  Afebrile 16.  AKI: Resolved 17. Hypernatremia: Sodium 139 on 8/16, improved 18. HTN: started propanolol 10mg  daily.   Vitals:   10/18/20 0355 10/18/20 0922  BP: 120/83 128/85  Pulse: 75 73  Resp: 20   Temp: 98.6 F (37 C)   SpO2: 94%    Controlled on 9/2  LOS: 21 days A FACE TO FACE EVALUATION WAS PERFORMED  Madge Therrien 12/18/20 10/18/2020, 11:20 AM

## 2020-10-18 NOTE — Plan of Care (Addendum)
Please disregard the following goal changes (except the eating goal, which remains n/a); patient discharging to SNF today and goals reverted as downgrading unable to be completed >48 hours prior to discharge.  The following goals have been downgraded due to slow progression towards therapy goals, safety, cognitive deficits, decreased balance, and limitations in safe functional mobility.  Problem: RH Eating Goal: LTG Patient will perform eating w/assist, cues/equip (OT) Description: LTG: Patient will perform eating with assist, with/without cues using equipment (OT) Outcome: Not Applicable   Problem: RH Balance Goal: LTG: Patient will maintain dynamic sitting balance (OT) Description: LTG:  Patient will maintain dynamic sitting balance with assistance during activities of daily living (OT) Flowsheets (Taken 10/18/2020 0821) LTG: Pt will maintain dynamic sitting balance during ADLs with: Contact Guard/Touching assist   Problem: Sit to Stand Goal: LTG:  Patient will perform sit to stand in prep for activites of daily living with assistance level (OT) Description: LTG:  Patient will perform sit to stand in prep for activites of daily living with assistance level (OT) Flowsheets (Taken 10/18/2020 0821) LTG: PT will perform sit to stand in prep for activites of daily living with assistance level: Moderate Assistance - Patient 50 - 74%   Problem: RH Dressing Goal: LTG Patient will perform upper body dressing (OT) Description: LTG Patient will perform upper body dressing with assist, with/without cues (OT). Flowsheets (Taken 10/18/2020 0821) LTG: Pt will perform upper body dressing with assistance level of: Moderate Assistance - Patient 50 - 74% Goal: LTG Patient will perform lower body dressing w/assist (OT) Description: LTG: Patient will perform lower body dressing with assist, with/without cues in positioning using equipment (OT) Flowsheets (Taken 10/18/2020 0821) LTG: Pt will perform lower body  dressing with assistance level of: Maximal Assistance - Patient 25 - 49%   Problem: RH Toileting Goal: LTG Patient will perform toileting task (3/3 steps) with assistance level (OT) Description: LTG: Patient will perform toileting task (3/3 steps) with assistance level (OT)  Flowsheets (Taken 10/18/2020 0821) LTG: Pt will perform toileting task (3/3 steps) with assistance level: Maximal Assistance - Patient 25 - 49%   Problem: RH Toilet Transfers Goal: LTG Patient will perform toilet transfers w/assist (OT) Description: LTG: Patient will perform toilet transfers with assist, with/without cues using equipment (OT) Flowsheets (Taken 10/18/2020 0821) LTG: Pt will perform toilet transfers with assistance level of: Moderate Assistance - Patient 50 - 74%

## 2020-10-18 NOTE — Progress Notes (Signed)
Occupational Therapy Discharge Summary  Patient Details  Name: Brent [name] MRN: [id number] Date of Birth: 12/29/1965   Patient has met 4 of 11 long term goals due to improved activity tolerance, improved balance, and functional use of  LEFT upper extremity.  Patient to discharge at overall Max Assist level. Patient's care partner requires assistance to provide the necessary physical and cognitive assistance at discharge.    Reasons goals not met: Patient has made limited progress and continues to require mod-max A for BADLs and max-total A +2 for squat pivot transfers, sit<>stand, and mod-max A to maintain both sitting and standing balance. Patient demos fluctuating performance in BADLs and functional mobility, is frequently anxious/upset, is highly distractible, and demos limited safety awareness and functional cognition impairing ability to complete BADLs/functional mobility safely with decreased assistance. Requires max frequent and simple multimodal instructional cues during ADLs and functional mobility and often requires max encouragement to participate in sessions/tasks.   Recommendation:  Patient will benefit from ongoing skilled OT services in skilled nursing facility setting to continue to advance functional skills in the area of BADL and Reduce care partner burden.  Equipment: No equipment provided  Reasons for discharge: discharge from hospital  Patient/family agrees with progress made and goals achieved: Yes  OT Discharge Precautions/Restrictions  Precautions Precautions: Fall;Other (comment) Precaution Comments: L hemi, impaired safety awareness, impulsive, NPO Restrictions Weight Bearing Restrictions: No Pain Pain Assessment Pain Scale: Faces Faces Pain Scale: No hurt ADL ADL Eating: NPO Grooming: Supervision/safety, Setup Where Assessed-Grooming: Sitting at sink, Edge of bed, Chair Upper Body Bathing: Minimal assistance Where Assessed-Upper Body Bathing:  Shower, Sitting at sink Lower Body Bathing: Maximal assistance Where Assessed-Lower Body Bathing: Shower Upper Body Dressing: Minimal assistance, Maximal cueing Where Assessed-Upper Body Dressing: Edge of bed Lower Body Dressing: Maximal assistance Where Assessed-Lower Body Dressing: Bed level Toileting: Maximal assistance, Dependent, Other (Comment) (+2 A) Where Assessed-Toileting: Bedside Commode, Bed level Toilet Transfer: Maximal assistance, Dependent, Maximal verbal cueing Toilet Transfer Method: Squat pivot, Other (comment) (Stedy) Toilet Transfer Equipment: Drop arm bedside commode Tub/Shower Transfer: Not assessed Walk-In Shower Transfer: Dependent Walk-In Shower Transfer Method:  (Stedy) Walk-In Shower Equipment: Transfer tub bench, Grab bars Vision Baseline Vision/History: 0 No visual deficits Patient Visual Report: No change from baseline Perception  Perception: Impaired Inattention/Neglect: Does not attend to left side of body Praxis Praxis: Impaired Praxis Impairment Details: Perseveration Cognition Overall Cognitive Status: Impaired/Different from baseline Arousal/Alertness: Awake/alert Orientation Level: Oriented to person;Oriented to place Year: 2022 Month: September Day of Week: Correct Attention: Sustained Focused Attention: Appears intact Sustained Attention: Impaired Sustained Attention Impairment: Verbal basic;Functional basic Memory: Impaired Memory Impairment: Retrieval deficit;Storage deficit Immediate Memory Recall: Sock;Blue;Bed Memory Recall Sock: Not able to recall Memory Recall Blue: With Cue Memory Recall Bed: Not able to recall Awareness: Impaired Awareness Impairment: Emergent impairment Problem Solving: Impaired Problem Solving Impairment: Verbal basic;Functional basic Executive Function: Self Monitoring;Reasoning Reasoning: Impaired Reasoning Impairment: Verbal basic;Functional basic Self Monitoring: Impaired Self Monitoring  Impairment: Verbal basic;Functional basic Behaviors: Restless;Impulsive;Verbal agitation Safety/Judgment: Impaired Sensation Sensation Light Touch: Impaired by gross assessment (Appears intact BUEs) Light Touch Impaired Details: Impaired RLE;Impaired LLE Hot/Cold: Not tested Proprioception: Impaired by gross assessment Stereognosis: Not tested Coordination Gross Motor Movements are Fluid and Coordinated: No Fine Motor Movements are Fluid and Coordinated: No Coordination and Movement Description: Brunnstrum stage II in the arm and stage IV in the hand.  Needs max hand over hand to try and integrate into functional tasks. Motor  Motor Motor: Hemiplegia;Abnormal postural   alignment and control;Abnormal tone Motor - Discharge Observations: LUE and LLE hemiparesis with slight increased tone in LUE Mobility  Bed Mobility Bed Mobility: Rolling Right;Rolling Left;Supine to Sit;Sit to Supine Rolling Right: Minimal Assistance - Patient > 75% Rolling Left: Minimal Assistance - Patient > 75% Supine to Sit: Maximal Assistance - Patient - Patient 25-49%;Moderate Assistance - Patient 50-74% Sit to Supine: Moderate Assistance - Patient 50-74%;Maximal Assistance - Patient 25-49% Transfers Sit to Stand: Moderate Assistance - Patient 50-74%;Maximal Assistance - Patient 25-49% Stand to Sit: Moderate Assistance - Patient 50-74%;Maximal Assistance - Patient 25-49%  Squat Pivot Transfers: Maximal Assistance - Patient 25-49%; Total Assistance - Patient <25%; 2 helpers Lateral/Scoot Transfers: Maximal Assistance - Patient 25-49% Transfer via [place]: Stedy Trunk/Postural Assessment  Cervical Assessment Cervical Assessment: Exceptions to [facility] (forward head) Thoracic Assessment Thoracic Assessment: Exceptions to [facility] (thoracic rounding) Lumbar Assessment Lumbar Assessment: Exceptions to [facility] (posterior pelvic tilt) Postural Control Postural Control: Deficits on evaluation Righting Reactions: Improved  since eval, but continued delayed and insufficient in sitting with R posterior trunk LOB  Balance Balance Balance Assessed: Yes Static Sitting Balance Static Sitting - Balance Support: Feet supported Static Sitting - Level of Assistance: 5: Stand by assistance Static Sitting - Comment/# of Minutes: 5-10 Dynamic Sitting Balance Dynamic Sitting - Balance Support: During functional activity Dynamic Sitting - Level of Assistance: 3: Mod assist Dynamic Sitting - Balance Activities: Lateral lean/weight shifting;Forward lean/weight shifting;Reaching for objects Static Standing Balance Static Standing - Balance Support: Bilateral upper extremity supported;During functional activity Static Standing - Level of Assistance: 3: Mod assist;2: Max assist Extremity/Trunk Assessment RUE Assessment RUE Assessment: Within Functional Limits General Strength Comments: AROM WFLs for selfcare tasks with strength at least 3+/5 but not formally assessed. LUE Assessment LUE Assessment: Exceptions to [facility] Passive Range of Motion (PROM) Comments: WFLs for all joints Active Range of Motion (AROM) Comments: Brunnstrum stage II in the arm and stage IV in the hand.  Synergy pattern developing in the arm with pt exhibiting 80% gross digit flexion and 60% extension to command LUE Body System: Neuro LUE Tone LUE Tone: Mild;Other (Comment) (increased flexor tone in elbow)   Mellissa Kohut 10/18/2020, 11:08 AM

## 2020-10-18 NOTE — Progress Notes (Signed)
Patient ID: Brent [name], male   DOB: Aug 16, 1965, 55 y.o.   MRN: [id number]  Spoke with [facility] and plan to transfer today. Will set up ambulance at 12:00. Mom and pt aware of plan.

## 2020-10-18 NOTE — Plan of Care (Signed)
Patient not cognitvely aware enough to manage medications at discharge and care for self; discharging to a SNF

## 2020-10-18 NOTE — Progress Notes (Signed)
Inpatient Rehabilitation Care Coordinator Discharge Note   Patient Details  Name: Brent [name] MRN: [id number] Date of Birth: 26-Jun-1965   Discharge location: [facility]SNF  Length of Stay: 21 DAYS  Discharge activity level: MOD ASSIST LEVEL  Home/community participation: YES  Patient response EH:UDJSHF Literacy - How often do you need to have someone help you when you read instructions, pamphlets, or other written material from your doctor or pharmacy?: Often  Patient response WY:OVZCHY Isolation - How often do you feel lonely or isolated from those around you?: Rarely  Services provided included: MD, RD, PT, OT, SLP, RN, CM, Pharmacy, Neuropsych, SW  Financial Services:  Financial Services Utilized: Medicaid    Choices offered to/list presented to: PT AND MOM  Follow-up services arranged:  Other (Comment) ([facility])           Patient response to transportation need: Is the patient able to respond to transportation needs?: Yes In the past 12 months, has lack of transportation kept you from medical appointments or from getting medications?: No In the past 12 months, has lack of transportation kept you from meetings, work, or from getting things needed for daily living?: No    Comments (or additional information): PT NEEDS MORE CARE THAN FAMILY CAN PROVIDE HOPEFULLY WILL CONTINUE TO PROGRESS AND GO HOME FROM NH  Patient/Family verbalized understanding of follow-up arrangements:  Yes  Individual responsible for coordination of the follow-up plan: MARGARRT-MOM [phone]  Confirmed correct DME delivered: Lucy Chris 10/18/2020    Charika Mikelson, Lemar Livings

## 2020-10-18 NOTE — Progress Notes (Signed)
Patient frequently calling for assitance to sit on bedpan and unable to have  larger than a smear bowel movement Will notify PA in the [email or web address] on hourly rounding and the -plan for staff to come into the room once per hour on hourly rounding schedule to address needs for toileting and comfort and to release from restraints every two hours per protocol to assess skin, however staff would not disturb him in the interim. Pt currently calling out for a person named "Loraine Leriche" This nurse will Round on the patient Q2 hourly to ensure safety. Patient offered ice chips and oral care refused and is perseverating on having a bowel movement.

## 2020-10-18 NOTE — Plan of Care (Signed)
Problem: RH Grooming Goal: LTG Patient will perform grooming w/assist,cues/equip (OT) Description: LTG: Patient will perform grooming with assist, with/without cues using equipment (OT) Outcome: Adequate for Discharge   Problem: RH Balance Goal: LTG: Patient will maintain dynamic sitting balance (OT) Description: LTG:  Patient will maintain dynamic sitting balance with assistance during activities of daily living (OT) 10/18/2020 1124 by Mellissa Kohut, OT Outcome: Not Met (add Reason) Note: Patient demo's fluctuating sitting balance, occasionally still requiring mod-max A to correct posterior bias sitting EOB. 10/18/2020 0958 by Mellissa Kohut, OT Flowsheets (Taken 10/18/2020 [phone]) LTG: Pt will maintain dynamic sitting balance during ADLs with: Supervision/Verbal cueing 10/18/2020 0821 by Mellissa Kohut, OT Flowsheets (Taken 10/18/2020 [phone]) LTG: Pt will maintain dynamic sitting balance during ADLs with: Contact Guard/Touching assist Goal: LTG Patient will maintain dynamic standing with ADLs (OT) Description: LTG:  Patient will maintain dynamic standing balance with assist during activities of daily living (OT)  Outcome: Not Met (add Reason) Note: Patient performance fluctuates but typically requires max A to maintain standing balance due to poor balance and limited safety awareness.    Problem: Sit to Stand Goal: LTG:  Patient will perform sit to stand in prep for activites of daily living with assistance level (OT) Description: LTG:  Patient will perform sit to stand in prep for activites of daily living with assistance level (OT) 10/18/2020 1124 by Mellissa Kohut, OT Outcome: Not Met (add Reason) Note: Patient requires mod-max A, occasionally +2, to complete at d/c.  10/18/2020 0958 by Mellissa Kohut, OT Flowsheets (Taken 10/18/2020 [phone]) LTG: PT will perform sit to stand in prep for activites of daily living with assistance level: Minimal Assistance - Patient > 75% 10/18/2020 0821 by  Mellissa Kohut, OT Flowsheets (Taken 10/18/2020 [phone]) LTG: PT will perform sit to stand in prep for activites of daily living with assistance level: Moderate Assistance - Patient 50 - 74%   Problem: RH Dressing Goal: LTG Patient will perform upper body dressing (OT) Description: LTG Patient will perform upper body dressing with assist, with/without cues (OT). 10/18/2020 1124 by Mellissa Kohut, OT Outcome: Not Met (add Reason) Note: Patient requires mod-max A to don pull-over shirts at d/c.  10/18/2020 0958 by Mellissa Kohut, OT Flowsheets (Taken 10/18/2020 [phone]) LTG: Pt will perform upper body dressing with assistance level of: Supervision/Verbal cueing 10/18/2020 0821 by Mellissa Kohut, OT Flowsheets (Taken 10/18/2020 [phone]) LTG: Pt will perform upper body dressing with assistance level of: Moderate Assistance - Patient 50 - 74% Goal: LTG Patient will perform lower body dressing w/assist (OT) Description: LTG: Patient will perform lower body dressing with assist, with/without cues in positioning using equipment (OT) 10/18/2020 1124 by Mellissa Kohut, OT Outcome: Not Met (add Reason) Note: Pt requires max A for LB dressing at d/c. 10/18/2020 0958 by Mellissa Kohut, OT Flowsheets (Taken 10/18/2020 [phone]) LTG: Pt will perform lower body dressing with assistance level of: Moderate Assistance - Patient 50 - 74% 10/18/2020 0821 by Mellissa Kohut, OT Flowsheets (Taken 10/18/2020 [phone]) LTG: Pt will perform lower body dressing with assistance level of: Maximal Assistance - Patient 25 - 49%   Problem: RH Toileting Goal: LTG Patient will perform toileting task (3/3 steps) with assistance level (OT) Description: LTG: Patient will perform toileting task (3/3 steps) with assistance level (OT)  10/18/2020 1124 by Mellissa Kohut, OT Outcome: Not Met (add Reason) Note: Patient requires max-total A for toileting, occasionally requiring +2 A. 10/18/2020 1660 by Patterson Hammersmith,  Franne Grip, OT Flowsheets (Taken 10/18/2020  [phone]) LTG: Pt will perform toileting task (3/3 steps) with assistance level: Moderate Assistance - Patient 50 - 74% 10/18/2020 0821 by Mellissa Kohut, OT Flowsheets (Taken 10/18/2020 [phone]) LTG: Pt will perform toileting task (3/3 steps) with assistance level: Maximal Assistance - Patient 25 - 49%   Problem: RH Toilet Transfers Goal: LTG Patient will perform toilet transfers w/assist (OT) Description: LTG: Patient will perform toilet transfers with assist, with/without cues using equipment (OT) 10/18/2020 1124 by Mellissa Kohut, OT Outcome: Not Met (add Reason) Note: Pt requires max-total A +2 for toilet transfers at d/c. 10/18/2020 0958 by Mellissa Kohut, OT Flowsheets (Taken 10/18/2020 [phone]) LTG: Pt will perform toilet transfers with assistance level of: Minimal Assistance - Patient > 75% 10/18/2020 0821 by Mellissa Kohut, OT Flowsheets (Taken 10/18/2020 [phone]) LTG: Pt will perform toilet transfers with assistance level of: Moderate Assistance - Patient 50 - 74%   Problem: RH Functional Use of Upper Extremity Goal: LTG Patient will use RT/LT upper extremity as a (OT) Description: LTG: Patient will use right/left upper extremity as a stabilizer/gross assist/diminished/nondominant/dominant level with assist, with/without cues during functional activity (OT) Outcome: Completed/Met   Problem: RH Memory Goal: LTG Patient will demonstrate ability for day to day recall/carry over during activities of daily living with assistance level (OT) Description: LTG:  Patient will demonstrate ability for day to day recall/carry over during activities of daily living with assistance level (OT). Outcome: Completed/Met   Problem: RH Attention Goal: LTG Patient will demonstrate this level of attention during functional activites (OT) Description: LTG:  Patient will demonstrate this level of attention during functional activites  (OT) Outcome: Completed/Met   Problem: RH Awareness Goal: LTG: Patient will  demonstrate awareness during functional activites type of (OT) Description: LTG: Patient will demonstrate awareness during functional activites type of (OT) Outcome: Completed/Met   Problem: RH Eating Goal: LTG Patient will perform eating w/assist, cues/equip (OT) Description: LTG: Patient will perform eating with assist, with/without cues using equipment (OT) Outcome: Not Applicable   Problem: RH Bathing Goal: LTG Patient will bathe all body parts with assist levels (OT) Description: LTG: Patient will bathe all body parts with assist levels (OT) Outcome: Not Applicable Note: Patient not completing showering at sit<>stand level at d/c as not appropriate due to lack of progress in standing balance. Patient performs UB bathing min-mod A at sink level and requires max A for LB bathing.    Problem: RH Tub/Shower Transfers Goal: LTG Patient will perform tub/shower transfers w/assist (OT) Description: LTG: Patient will perform tub/shower transfers with assist, with/without cues using equipment (OT) Outcome: Not Applicable Note: Tub/shower transfers not recommended for pt at d/c.

## 2020-10-18 NOTE — Progress Notes (Signed)
Modified Barium Swallow Progress Note  Patient Details  Name: Brent [name] MRN: [id number] Date of Birth: [birth year]  Today's Date: 10/18/2020  Modified Barium Swallow completed.  Full report located under Chart Review in the Imaging Section.  Brief recommendations include the following:  Clinical Impression  Patient presents with mild improvement in swallow function as compared to MBS 2 weeks ago. SLP introduced puree solids then dysphagia 3 solids prior to honey thick liquids to ensure no honey liquid residuals were present. Patient exhibited delayed oral phase with all consistencies and although mastication and oral transit of dysphagia 3 solids was decreased, he was able to fully masticate solids prior to initiating swallow. Swallow initiation was delayed to level of vallecular sinus with puree solids and dysphagia 3 solids and to level of pyriform sinus with honey thick liquids. Silent aspiration occured prior to swallow with cup sip of honey liquids as well as 2 of approximately 7 spoon sips of honey thick liquids and flash penetration of honey thick liquids occured with majority of all swallows. Patient did demonstrate an eventual cough however aspiration had already occured and cough was too late/ineffective to clear any of aspirate. Trace pharyngeal residuals observed with honey thick liquids in pyriform sinus which cleared with subsequent swallows and no significant amount of pharyngeal residuals observed with puree solids or dysphagia 3 solids. SLP is recommending Dys 3 solids, pudding thick liquids and full supervision with PO intake due to impulsivity and poor awareness. Do not anticipate patient would be able to advance with liquids or solids much beyond current recommendations but objective swallow assessment would be necessry prior to any advancements.   Swallow Evaluation Recommendations       SLP Diet Recommendations: Dysphagia 3 (Mech soft) solids;Pudding thick liquid    Liquid Administration via: Spoon   Medication Administration: Crushed with puree   Supervision: Patient able to self feed;Full supervision/cueing for compensatory strategies   Compensations: Minimize environmental distractions;Slow rate;Small sips/bites   Postural Changes: Seated upright at 90 degrees   Oral Care Recommendations: Oral care BID   Other Recommendations: Prohibited food (jello, ice cream, thin soups);Clarify dietary restrictions   Angela Nevin, MA, CCC-SLP Speech Therapy

## 2020-10-18 NOTE — Progress Notes (Signed)
Patient in bed with lrft arm out of soft wrist restraint. This nurse reapplied wrist restraint per order and monitored placement q2 hours per protocol this shift

## 2020-10-18 NOTE — Progress Notes (Signed)
Physical Therapy Discharge Summary  Patient Details  Name: Brent [name] MRN: [id number] Date of Birth: March 06, 1965  Today's Date: 10/18/2020       Patient has met 0 of 8 long term goals due to improved activity tolerance, improved balance, improved postural control, improved attention, and improved awareness.  Patient to discharge at a wheelchair level Max Assist.   Patient's care partner unavailable to provide the necessary physical and cognitive assistance at discharge.  Reasons goals not met: Pt continued to have significant awareness deficits and change in d/c to SNF preventing improved progress towards goals   Recommendation:  Patient will benefit from ongoing skilled PT services in skilled nursing facility setting to continue to advance safe functional mobility, address ongoing impairments in balance. Awareness, coordination, safety, strength, attention and minimize fall risk.  Equipment: No equipment provided  Reasons for discharge: lack of progress toward goals and discharge from hospital  Patient/family agrees with progress made and goals achieved: Yes  PT Discharge Precautions/Restrictions  Fall     Pain Interference Pain Interference Pain Effect on Sleep: 2. Occasionally Pain Interference with Therapy Activities: 2. Occasionally Pain Interference with Day-to-Day Activities: 2. Occasionally Vision/Perception  Perception Perception: Impaired Inattention/Neglect: Does not attend to left side of body Praxis Praxis: Impaired Praxis Impairment Details: Perseveration  Cognition Overall Cognitive Status: Impaired/Different from baseline Arousal/Alertness: Awake/alert Year: 2022 Month: September Day of Week: Correct Attention: Sustained Focused Attention: Appears intact Sustained Attention: Impaired Sustained Attention Impairment: Verbal basic;Functional basic Memory: Impaired Memory Impairment: Retrieval deficit;Storage deficit Immediate Memory Recall:  Sock;Blue;Bed Memory Recall Sock: Not able to recall Memory Recall Blue: With Cue Memory Recall Bed: Not able to recall Awareness: Impaired Awareness Impairment: Emergent impairment Problem Solving: Impaired Problem Solving Impairment: Verbal basic;Functional basic Executive Function: Self Monitoring;Reasoning Reasoning: Impaired Reasoning Impairment: Verbal basic;Functional basic Self Monitoring: Impaired Self Monitoring Impairment: Verbal basic;Functional basic Behaviors: Restless;Impulsive;Verbal agitation Safety/Judgment: Impaired Sensation Sensation Light Touch: Impaired by gross assessment (Appears intact BUEs) Light Touch Impaired Details: Impaired RLE;Impaired LLE Hot/Cold: Not tested Proprioception: Impaired by gross assessment Stereognosis: Not tested Coordination Gross Motor Movements are Fluid and Coordinated: No Fine Motor Movements are Fluid and Coordinated: No Coordination and Movement Description: Brunnstrum stage II in the arm and stage IV in the hand.  Needs max hand over hand to try and integrate into functional tasks. Motor  Motor Motor: Hemiplegia;Abnormal postural alignment and control;Abnormal tone Motor - Discharge Observations: LUE and LLE hemiparesis with slight increased tone in LUE  Mobility Bed Mobility Bed Mobility: Rolling Right;Rolling Left;Supine to Sit;Sit to Supine Rolling Right: Minimal Assistance - Patient > 75% Rolling Left: Minimal Assistance - Patient > 75% Supine to Sit: Maximal Assistance - Patient - Patient 25-49%;Moderate Assistance - Patient 50-74% Sit to Supine: Moderate Assistance - Patient 50-74%;Maximal Assistance - Patient 25-49% Transfers Transfers: Set designer Transfers;Sit to Stand;Stand to Sit;Transfer via [place] to Stand: Maximal Assistance - Patient 25-49% Stand to Sit: Maximal Assistance - Patient 25-49% Squat Pivot Transfers: Maximal Assistance - Patient 25-49% Lateral/Scoot Transfers: Maximal Assistance -  Patient 25-49% Transfer (Assistive device): None Transfer via Lift Equipment: Probation officer Ambulation: Yes Gait Assistance: Maximal Assistance - Patient 25-49% Gait Distance (Feet): 3 Feet Assistive device: Parallel bars Gait Gait: No Stairs / Additional Locomotion Stairs: No Pick up small object from the floor (from standing position) activity did not occur: Safety/medical concerns Wheelchair Mobility Wheelchair Mobility: No (pt refused)  Trunk/Postural Assessment  Cervical Assessment Cervical Assessment: Exceptions to [facility] (forward head) Thoracic  Assessment Thoracic Assessment: Exceptions to [facility] (thoracic rounding) Lumbar Assessment Lumbar Assessment: Exceptions to [facility] (posterior pelvic tilt) Postural Control Postural Control: Deficits on evaluation Righting Reactions: Improved since eval, but continued delayed and insufficient in sitting with R posterior trunk LOB  Balance Balance Balance Assessed: Yes Static Sitting Balance Static Sitting - Balance Support: Feet supported Static Sitting - Level of Assistance: 5: Stand by assistance Dynamic Sitting Balance Dynamic Sitting - Balance Support: During functional activity Dynamic Sitting - Level of Assistance: 3: Mod assist Dynamic Sitting - Balance Activities: Lateral lean/weight shifting;Forward lean/weight shifting;Reaching for objects Static Standing Balance Static Standing - Balance Support: Bilateral upper extremity supported;During functional activity Static Standing - Level of Assistance: 2: Max assist Extremity Assessment      RLE Assessment RLE Assessment: Exceptions to [facility] Passive Range of Motion (PROM) Comments: tightness noted moving into ankle DF RLE Strength Right Hip Flexion: 4/5 Right Hip Extension: 4-/5 Right Hip ABduction: 4/5 Right Hip ADduction: 4-/5 Right Knee Flexion: 4-/5 Right Knee Extension: 4-/5 Right Ankle Dorsiflexion: 4-/5 Right Ankle Plantar Flexion: 3/5 LLE  Assessment Passive Range of Motion (PROM) Comments: tightness noted moving into ankle DF LLE Strength Left Hip Flexion: 0/5 Left Hip Extension: 1/5 Left Hip ABduction: 0/5 Left Hip ADduction: 2+/5 Left Knee Flexion: 2/5 Left Knee Extension: 2/5 Left Ankle Dorsiflexion: 2/5 Left Ankle Plantar Flexion: 0/5    Lorie Phenix 10/19/2020, 8:03 AM

## 2020-10-18 NOTE — Progress Notes (Signed)
Patient discharged from unit  to SNF. Transportation provided via [employer]. [employer], LPN

## 2020-10-19 ENCOUNTER — Other Ambulatory Visit: Payer: Self-pay

## 2020-10-19 ENCOUNTER — Emergency Department (HOSPITAL_COMMUNITY): Payer: Medicaid Other

## 2020-10-19 ENCOUNTER — Emergency Department (HOSPITAL_COMMUNITY)
Admission: EM | Admit: 2020-10-19 | Discharge: 2020-10-19 | Disposition: A | Discharge status: Home / Self Care | Payer: Medicaid Other | Attending: Emergency Medicine | Admitting: Emergency Medicine

## 2020-10-19 ENCOUNTER — Encounter (HOSPITAL_COMMUNITY): Payer: Self-pay

## 2020-10-19 DIAGNOSIS — J449 Chronic obstructive pulmonary disease, unspecified: Secondary | ICD-10-CM | POA: Insufficient documentation

## 2020-10-19 DIAGNOSIS — Z794 Long term (current) use of insulin: Secondary | ICD-10-CM | POA: Diagnosis not present

## 2020-10-19 DIAGNOSIS — E119 Type 2 diabetes mellitus without complications: Secondary | ICD-10-CM | POA: Insufficient documentation

## 2020-10-19 DIAGNOSIS — S2242XA Multiple fractures of ribs, left side, initial encounter for closed fracture: Secondary | ICD-10-CM | POA: Insufficient documentation

## 2020-10-19 DIAGNOSIS — F17210 Nicotine dependence, cigarettes, uncomplicated: Secondary | ICD-10-CM | POA: Diagnosis not present

## 2020-10-19 DIAGNOSIS — W010XXA Fall on same level from slipping, tripping and stumbling without subsequent striking against object, initial encounter: Secondary | ICD-10-CM | POA: Insufficient documentation

## 2020-10-19 DIAGNOSIS — S0990XA Unspecified injury of head, initial encounter: Secondary | ICD-10-CM | POA: Insufficient documentation

## 2020-10-19 DIAGNOSIS — S299XXA Unspecified injury of thorax, initial encounter: Secondary | ICD-10-CM | POA: Diagnosis present

## 2020-10-19 DIAGNOSIS — M25562 Pain in left knee: Secondary | ICD-10-CM | POA: Insufficient documentation

## 2020-10-19 DIAGNOSIS — W19XXXA Unspecified fall, initial encounter: Secondary | ICD-10-CM

## 2020-10-19 DIAGNOSIS — Z79899 Other long term (current) drug therapy: Secondary | ICD-10-CM | POA: Diagnosis not present

## 2020-10-19 DIAGNOSIS — I2510 Atherosclerotic heart disease of native coronary artery without angina pectoris: Secondary | ICD-10-CM | POA: Diagnosis not present

## 2020-10-19 DIAGNOSIS — I10 Essential (primary) hypertension: Secondary | ICD-10-CM | POA: Insufficient documentation

## 2020-10-19 DIAGNOSIS — Z7902 Long term (current) use of antithrombotics/antiplatelets: Secondary | ICD-10-CM | POA: Diagnosis not present

## 2020-10-19 IMAGING — CT CT HEAD W/O CM
3 of 6 series · 15 of 47 positions shown, 18 images · non-contrast
Comparison: Head CT [DATE]

CLINICAL DATA: Fall with head trauma.

EXAM:
CT HEAD WITHOUT CONTRAST
TECHNIQUE: Contiguous axial images were obtained from the base of the skull
through the vertex without intravenous contrast.

[Series 5: head w o · axial · 0.41mm/px · z∈[+11,+151]mm · 10 of 32 slices shown, 13 images]
[im 2/32  brain]
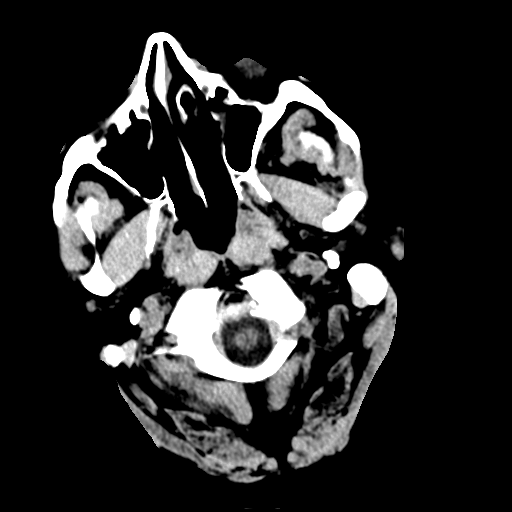
[im 2/32  bone]
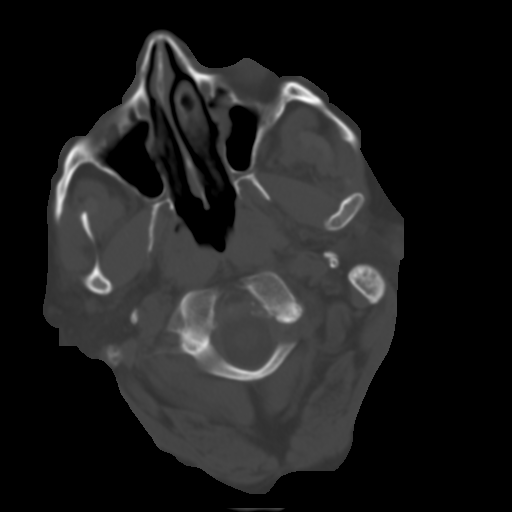
[im 6/32  brain]
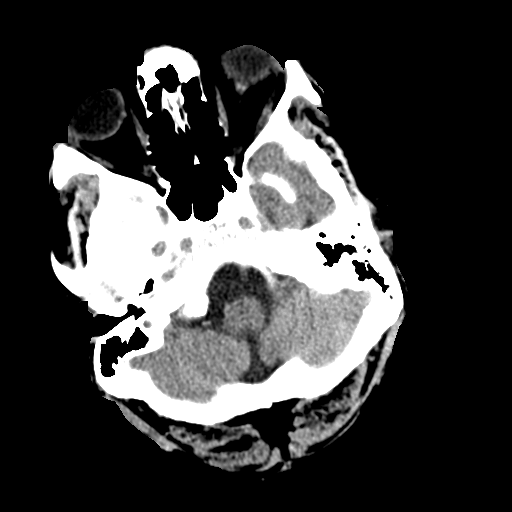
[im 9/32  brain]
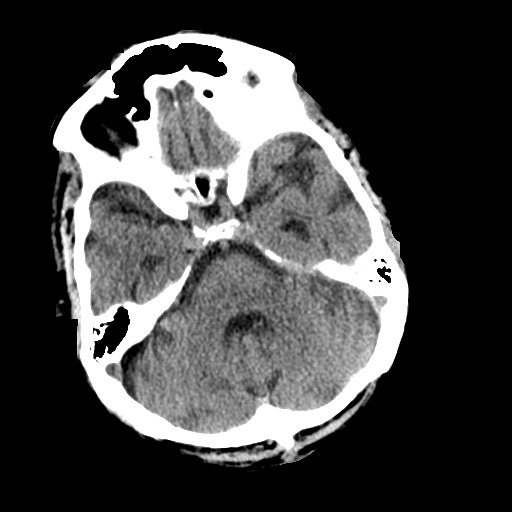
[im 11/32  brain]
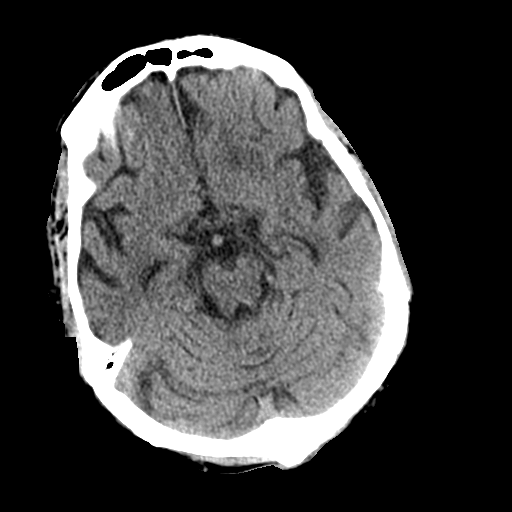
[im 14/32  brain]
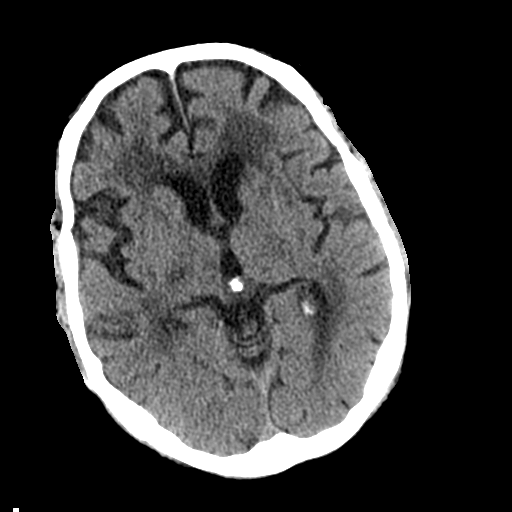
[im 14/32  bone]
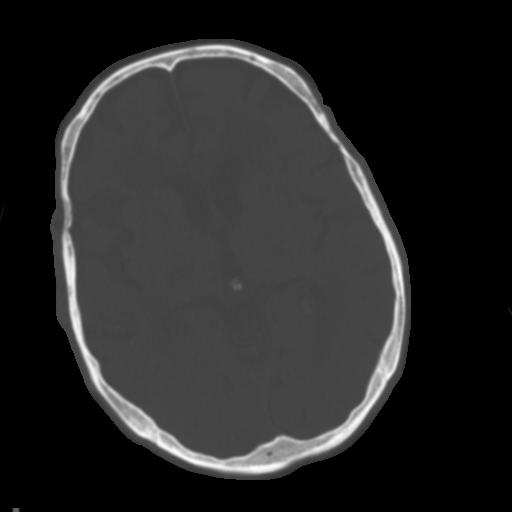
[im 18/32  brain]
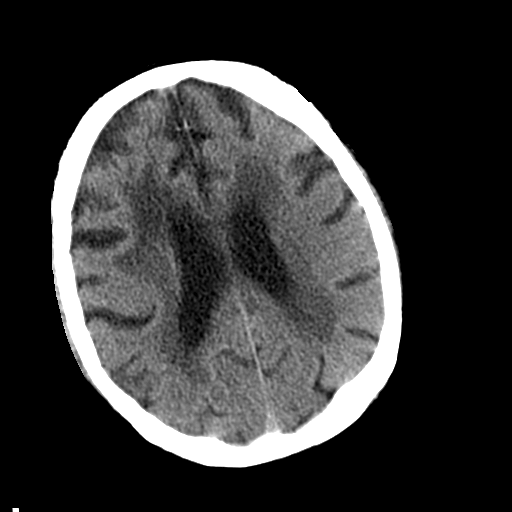
[im 21/32  brain]
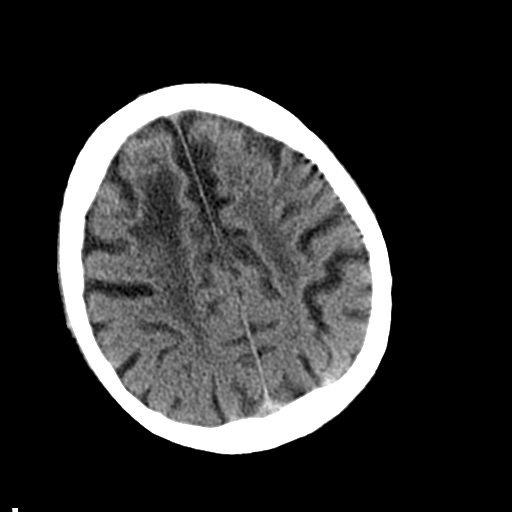
[im 23/32  brain]
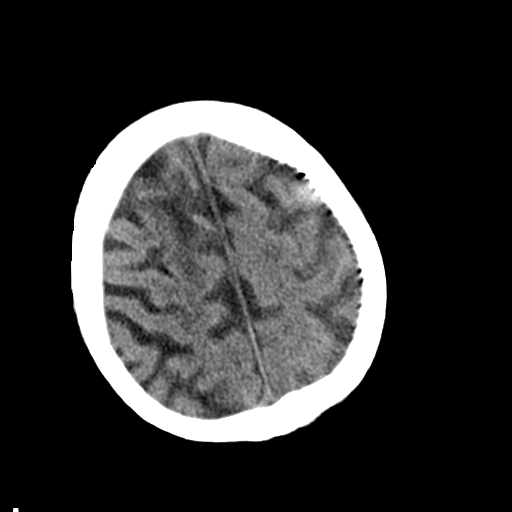
[im 26/32  brain]
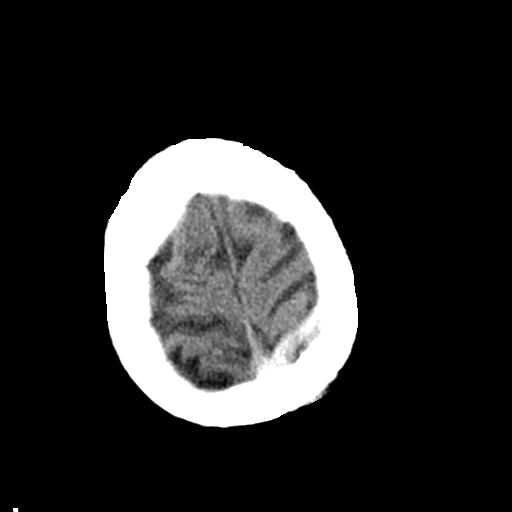
[im 26/32  bone]
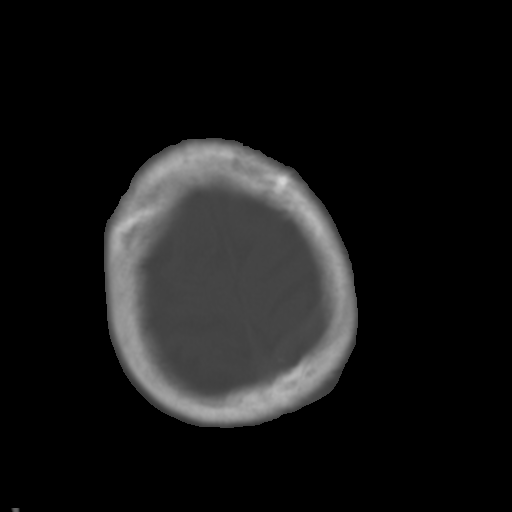
[im 30/32  brain]
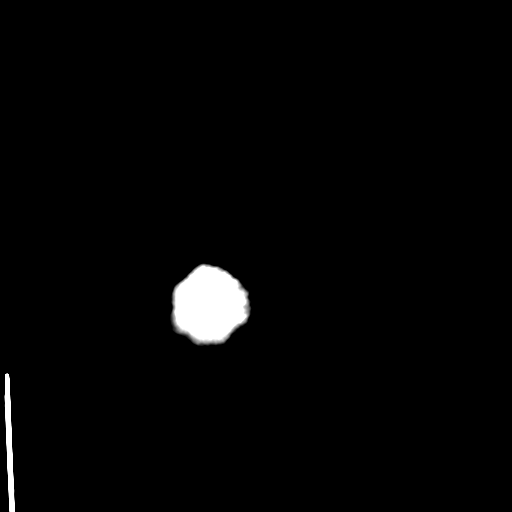

[Series 6: coronal soft · coronal · 0.33mm/px · 3 of 69 slices shown]
[im 18/69  brain]
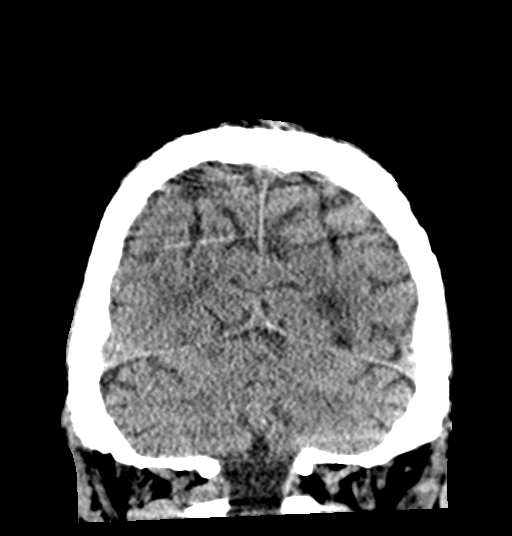
[im 35/69  brain]
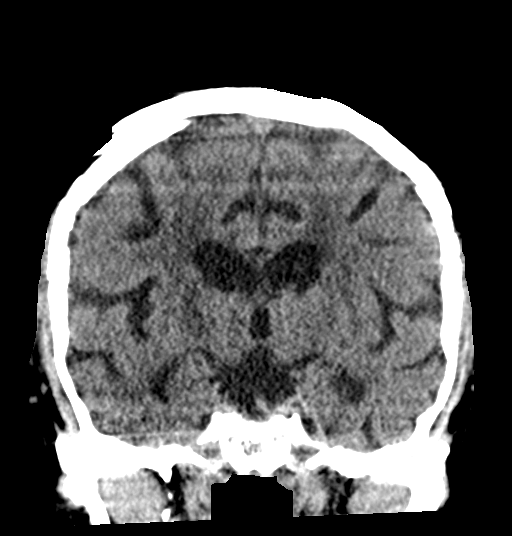
[im 52/69  brain]
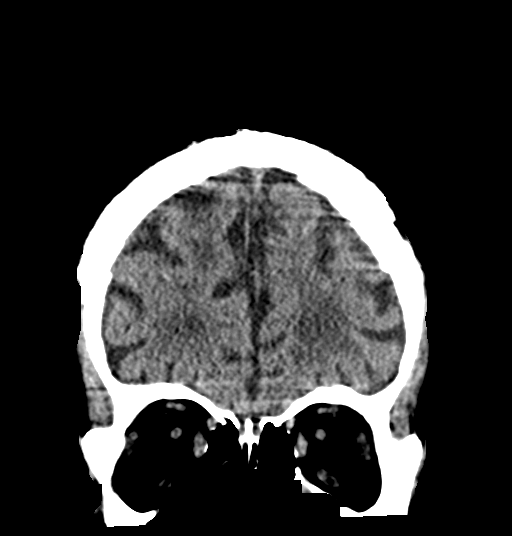

[Series 11: sagittal soft · sagittal · 0.28mm/px · 2 of 60 slices shown]
[im 20/60  brain]
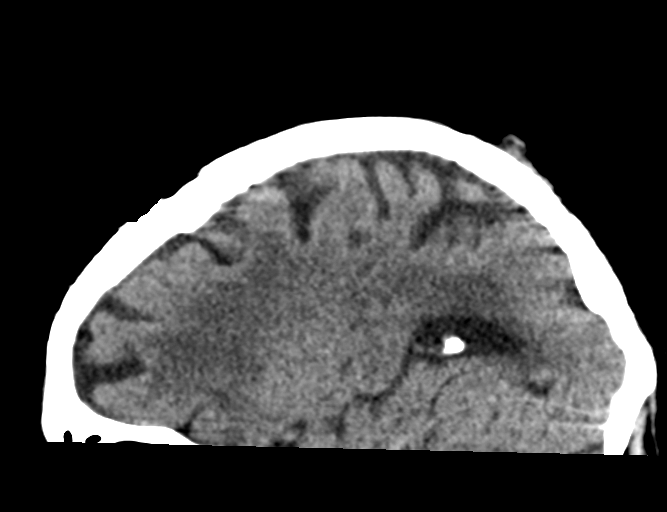
[im 40/60  brain]
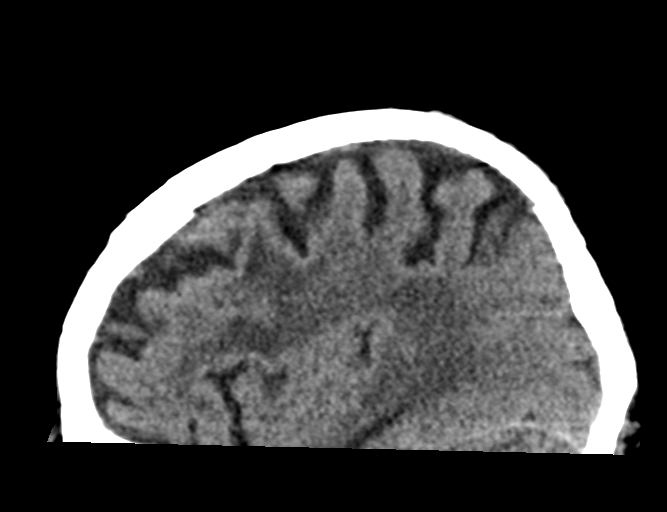

[15 of 47 positions shown; findings below may reference images not displayed]

FINDINGS: Motion degraded examination.

Brain: Age related global parenchymal volume loss with ex vacuo
dilatation of the ventricular system. Similar severe burden of
chronic ischemic small vessel white matter disease. Chronic lacunar
type infarcts in the bilateral basal ganglia and thalami. Chronic
right frontal infarction. No evidence of acute large vascular
territory infarction, hemorrhage, hydrocephalus, extra-axial
collection or mass lesion/mass effect.

Vascular: No hyperdense vessel. Atherosclerotic calcifications of
the internal carotid and vertebral arteries at the skull base.

Skull: Normal. Negative for fracture or focal lesion.

Sinuses/Orbits: Visualized portions of the paranasal sinuses and
mastoid air cells are predominantly clear. Orbits are grossly
unremarkable.

Other: None
IMPRESSION: 1. No definite acute intracranial abnormality on this motion
degraded examination.
2. Similar age related global parenchymal volume loss and severe
chronic ischemic small vessel white matter disease.
3. Chronic lacunar type infarcts in the bilateral basal ganglia and
thalami and chronic right frontal lobe infarcts.

## 2020-10-19 IMAGING — DX DG KNEE COMPLETE 4+V*L*
4 series · 4 of 4 positions shown · non-contrast
Comparison: None.

CLINICAL DATA: Fall, slid out of bed.  Left hip and knee pain.

EXAM:
LEFT KNEE - COMPLETE 4+ VIEW

[knee ap]
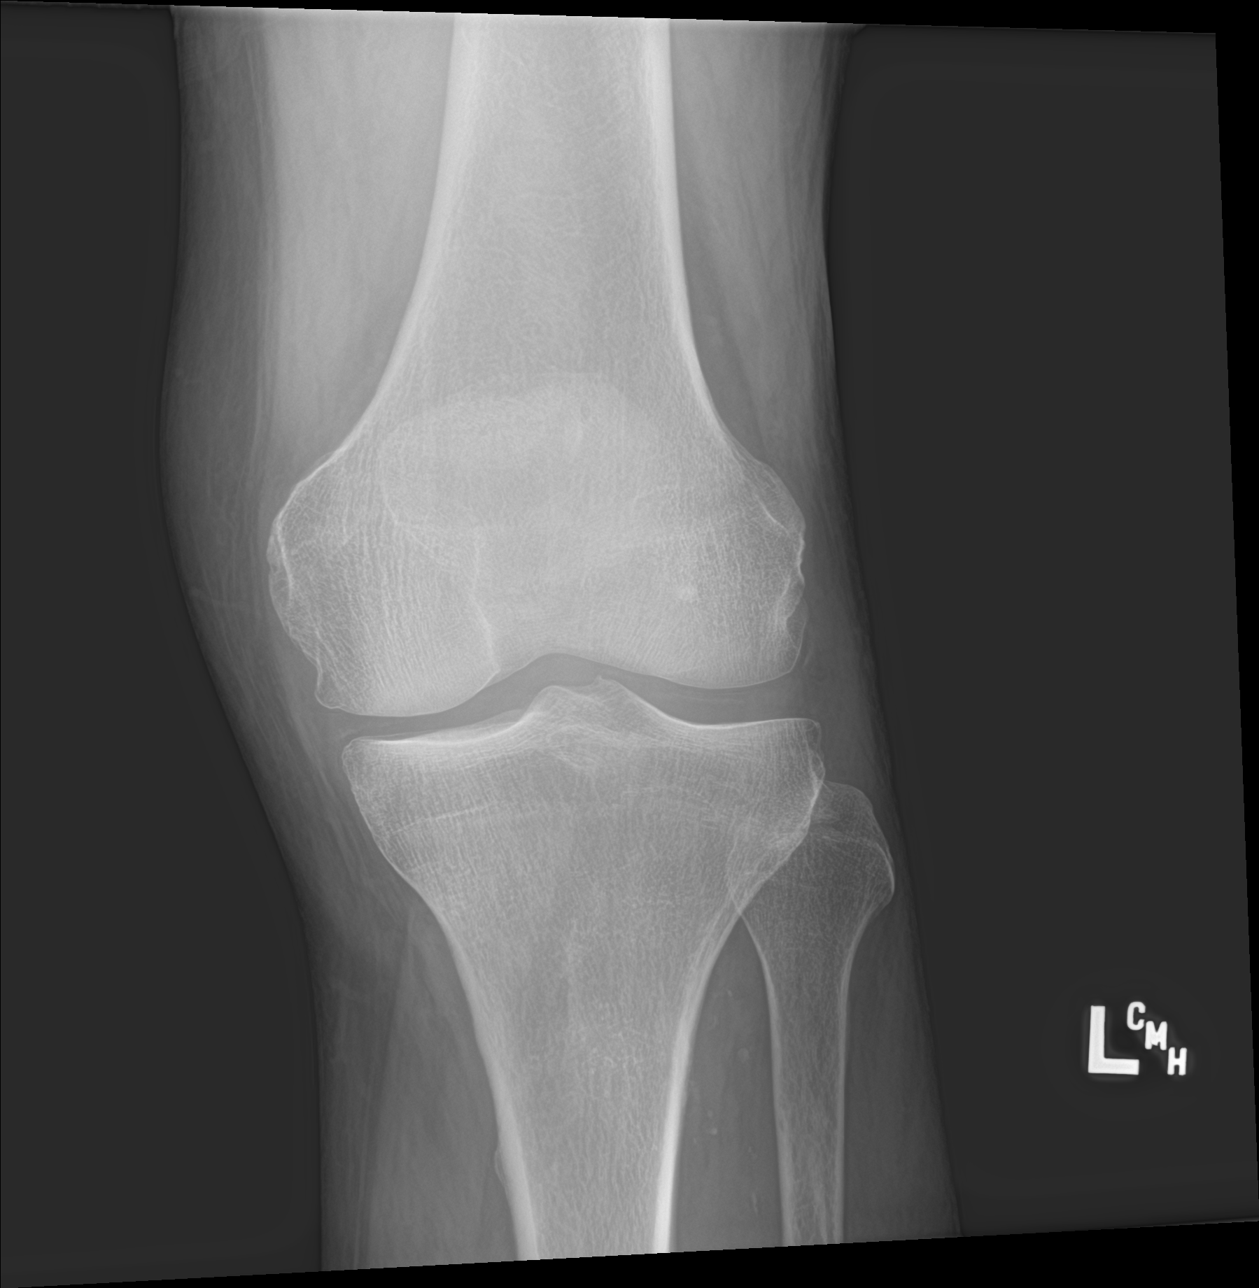

[knee obl (1 of 2)]
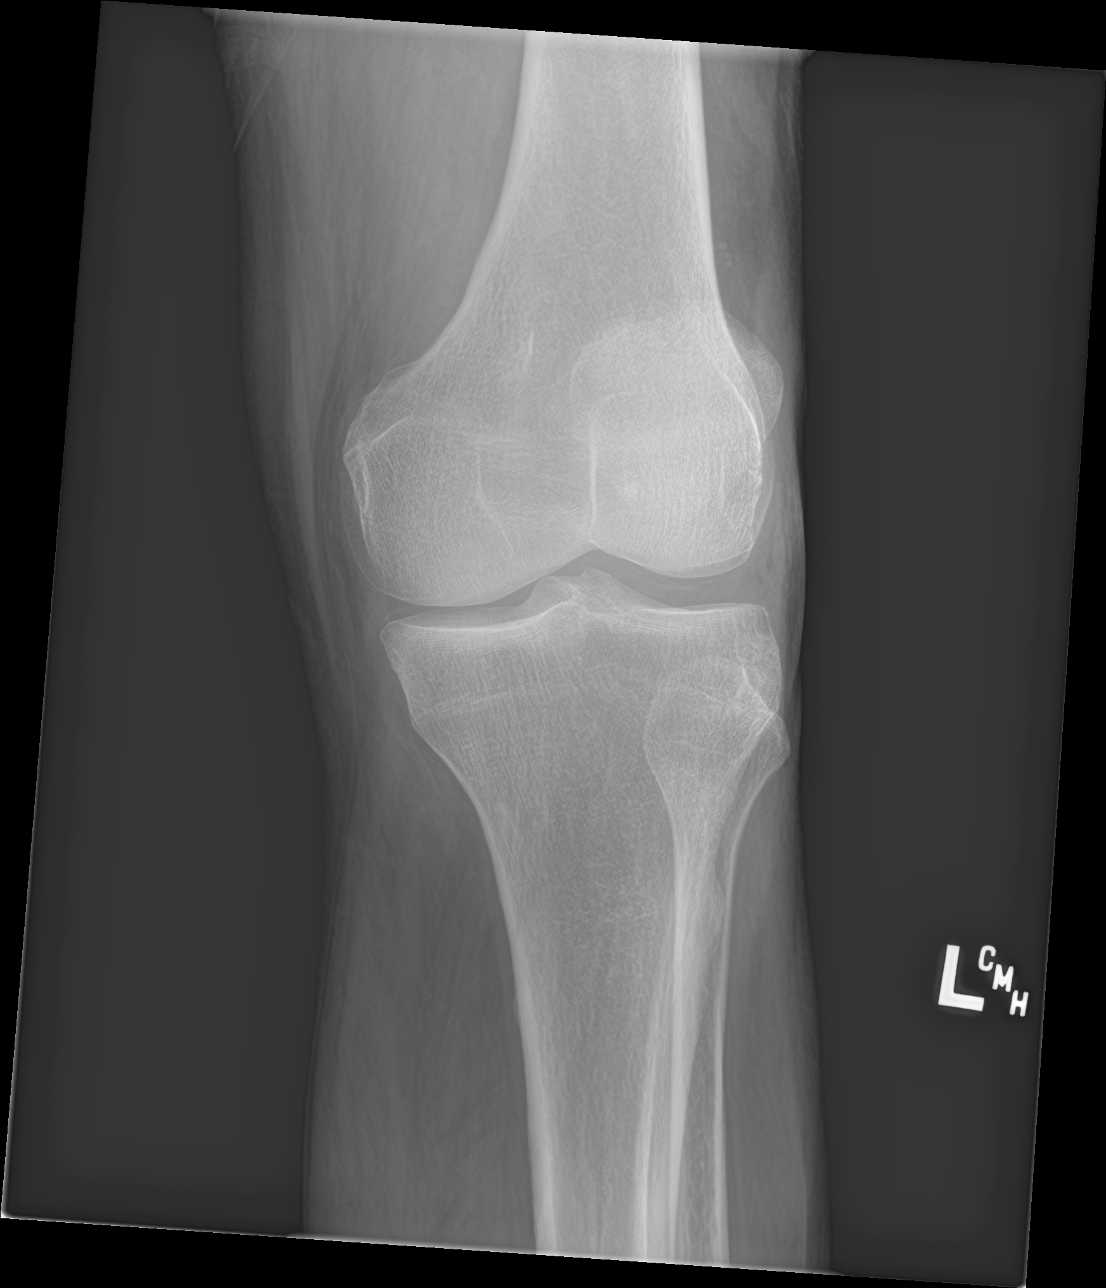

[knee obl (2 of 2)]
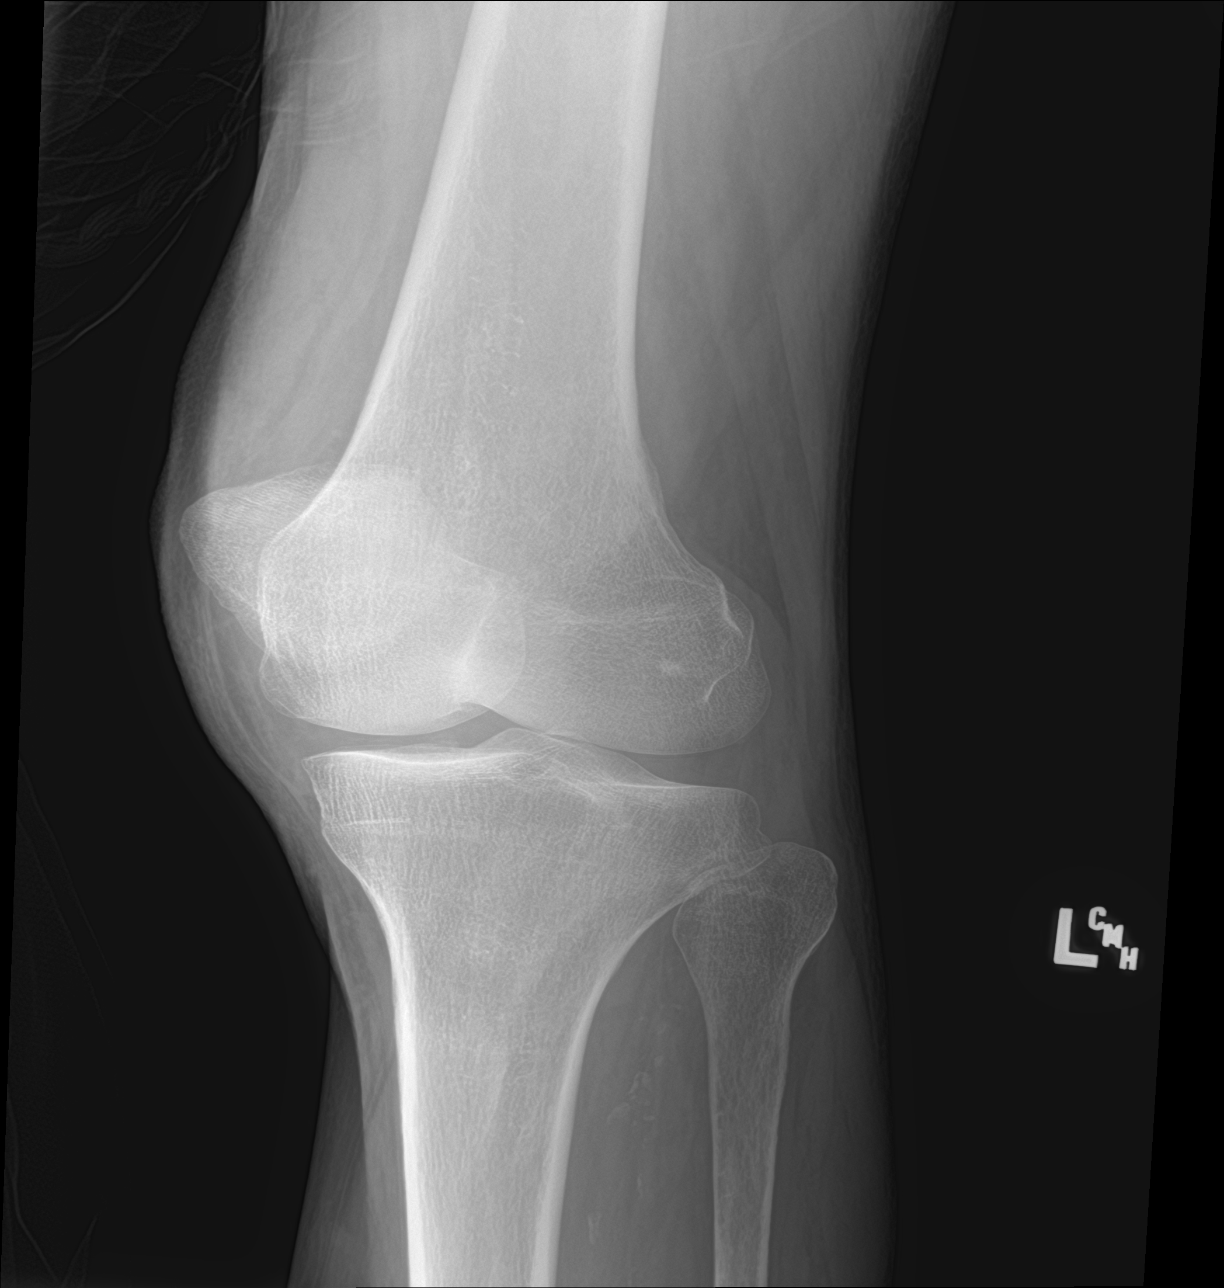

[knee lat]
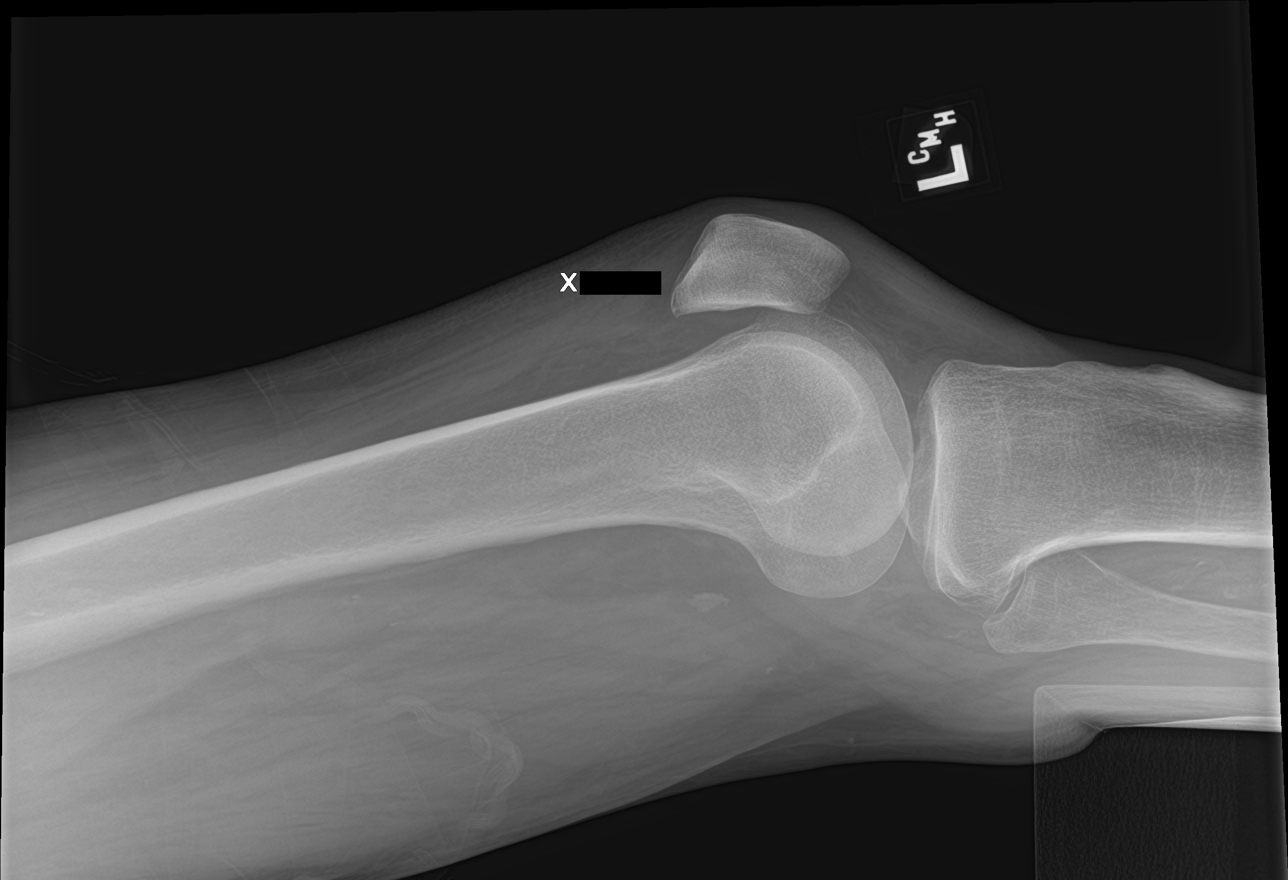

[4 of 4 positions shown; findings below may reference images not displayed]

FINDINGS: No evidence of fracture, dislocation, or joint effusion. Mild
degenerative change with peripheral spurring. Faint
chondrocalcinosis. Minimal medial soft tissue edema.
IMPRESSION: 1. Soft tissue edema without acute fracture or dislocation.
2. Mild osteoarthritis and chondrocalcinosis.

## 2020-10-19 IMAGING — DX DG HIP (WITH OR WITHOUT PELVIS) 2-3V*L*
3 series · 3 of 3 positions shown · non-contrast
Comparison: None.

CLINICAL DATA: Fall.  Slid out of bed.  Left hip pain.

EXAM:
DG HIP (WITH OR WITHOUT PELVIS) 2-3V LEFT

[pelvis ap]
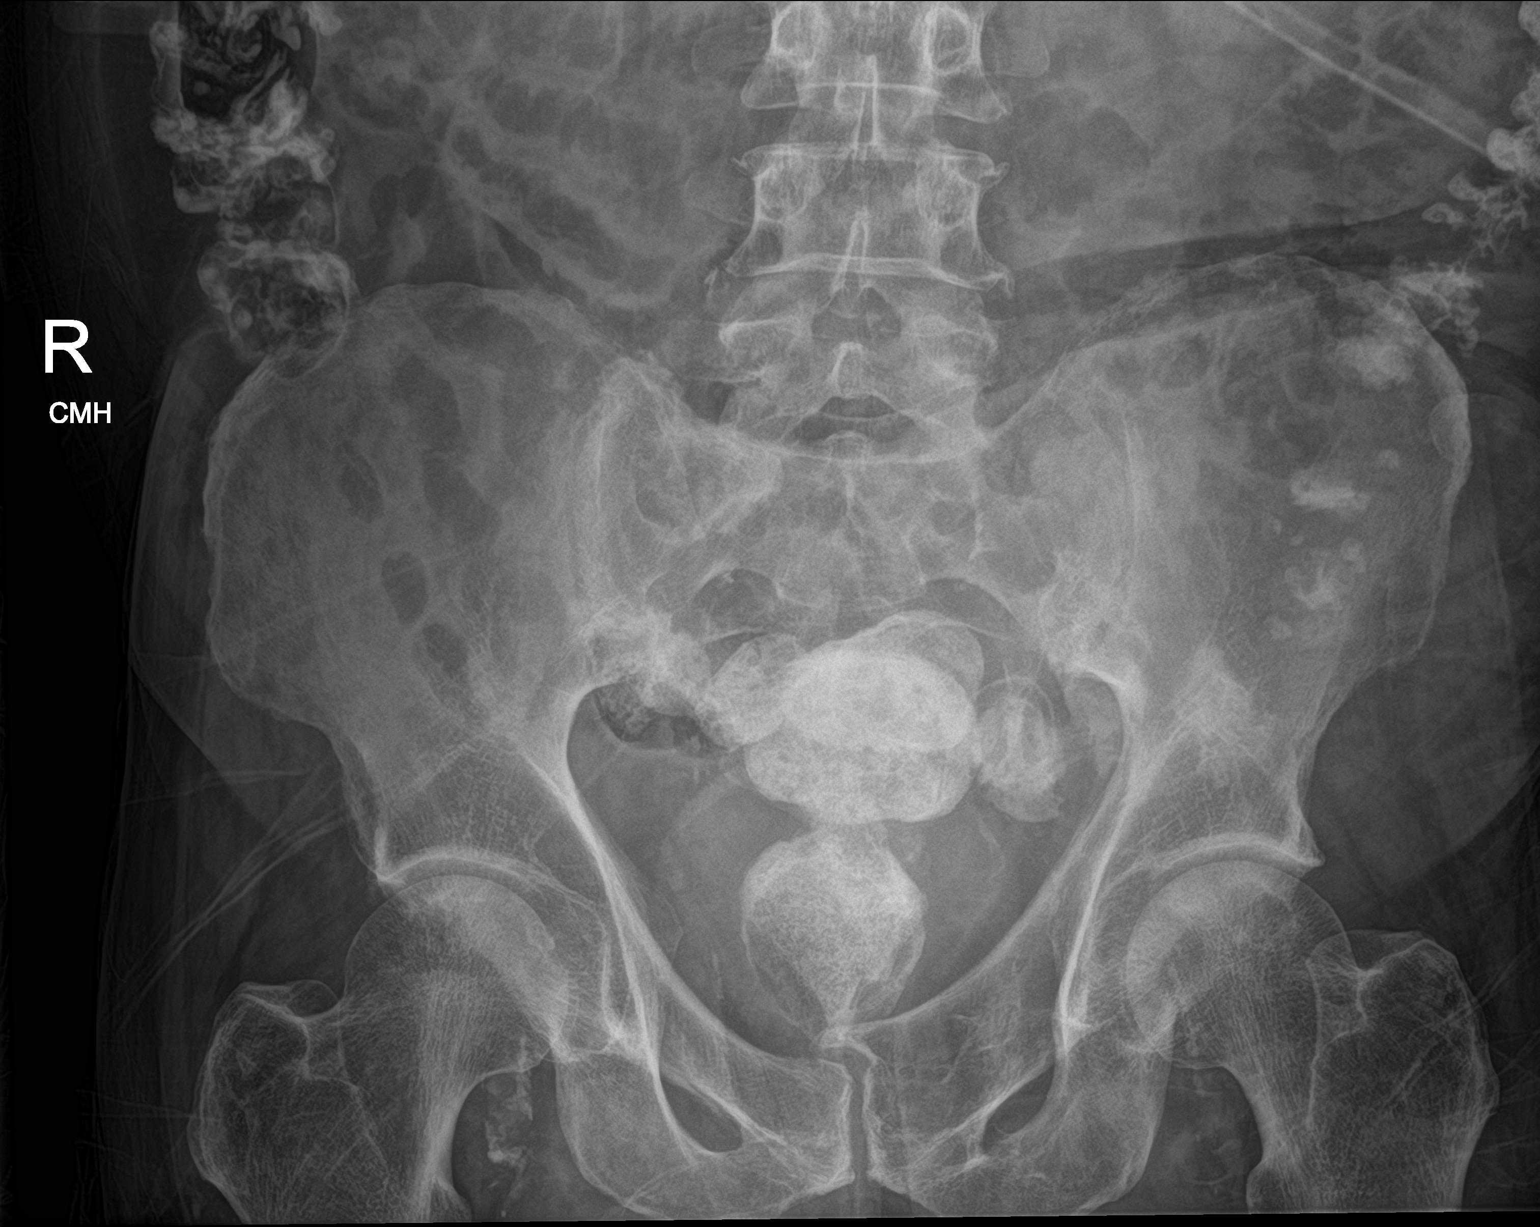

[hip ap]
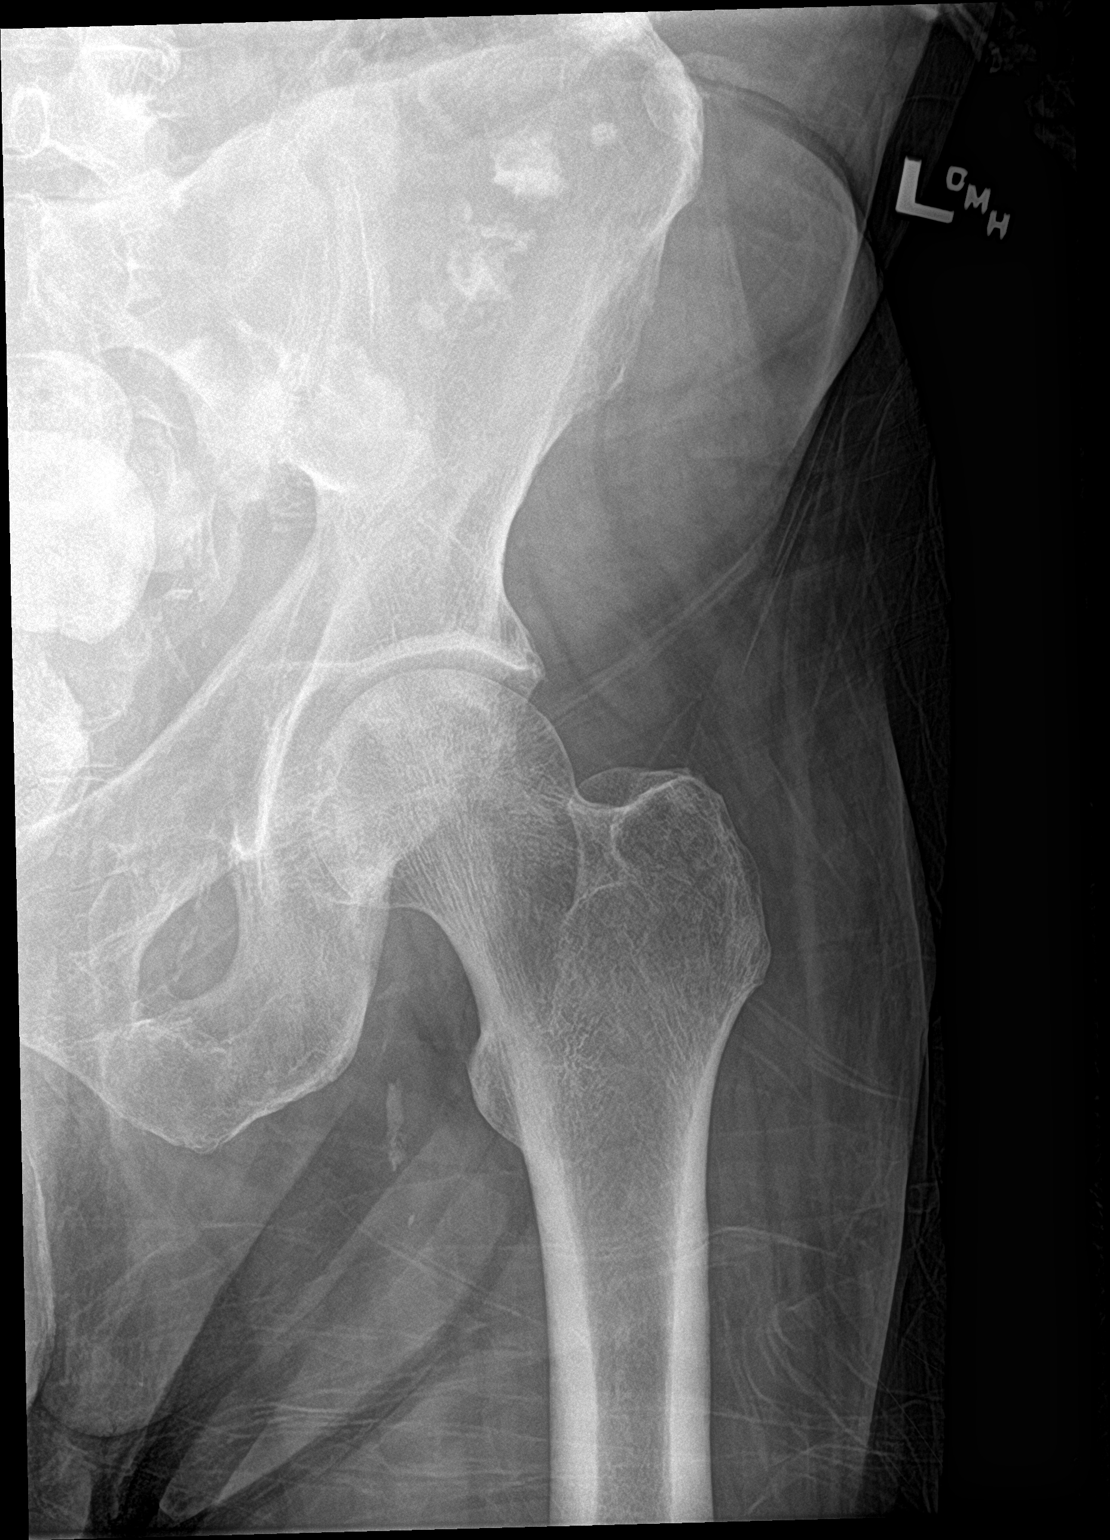

[hip lat]
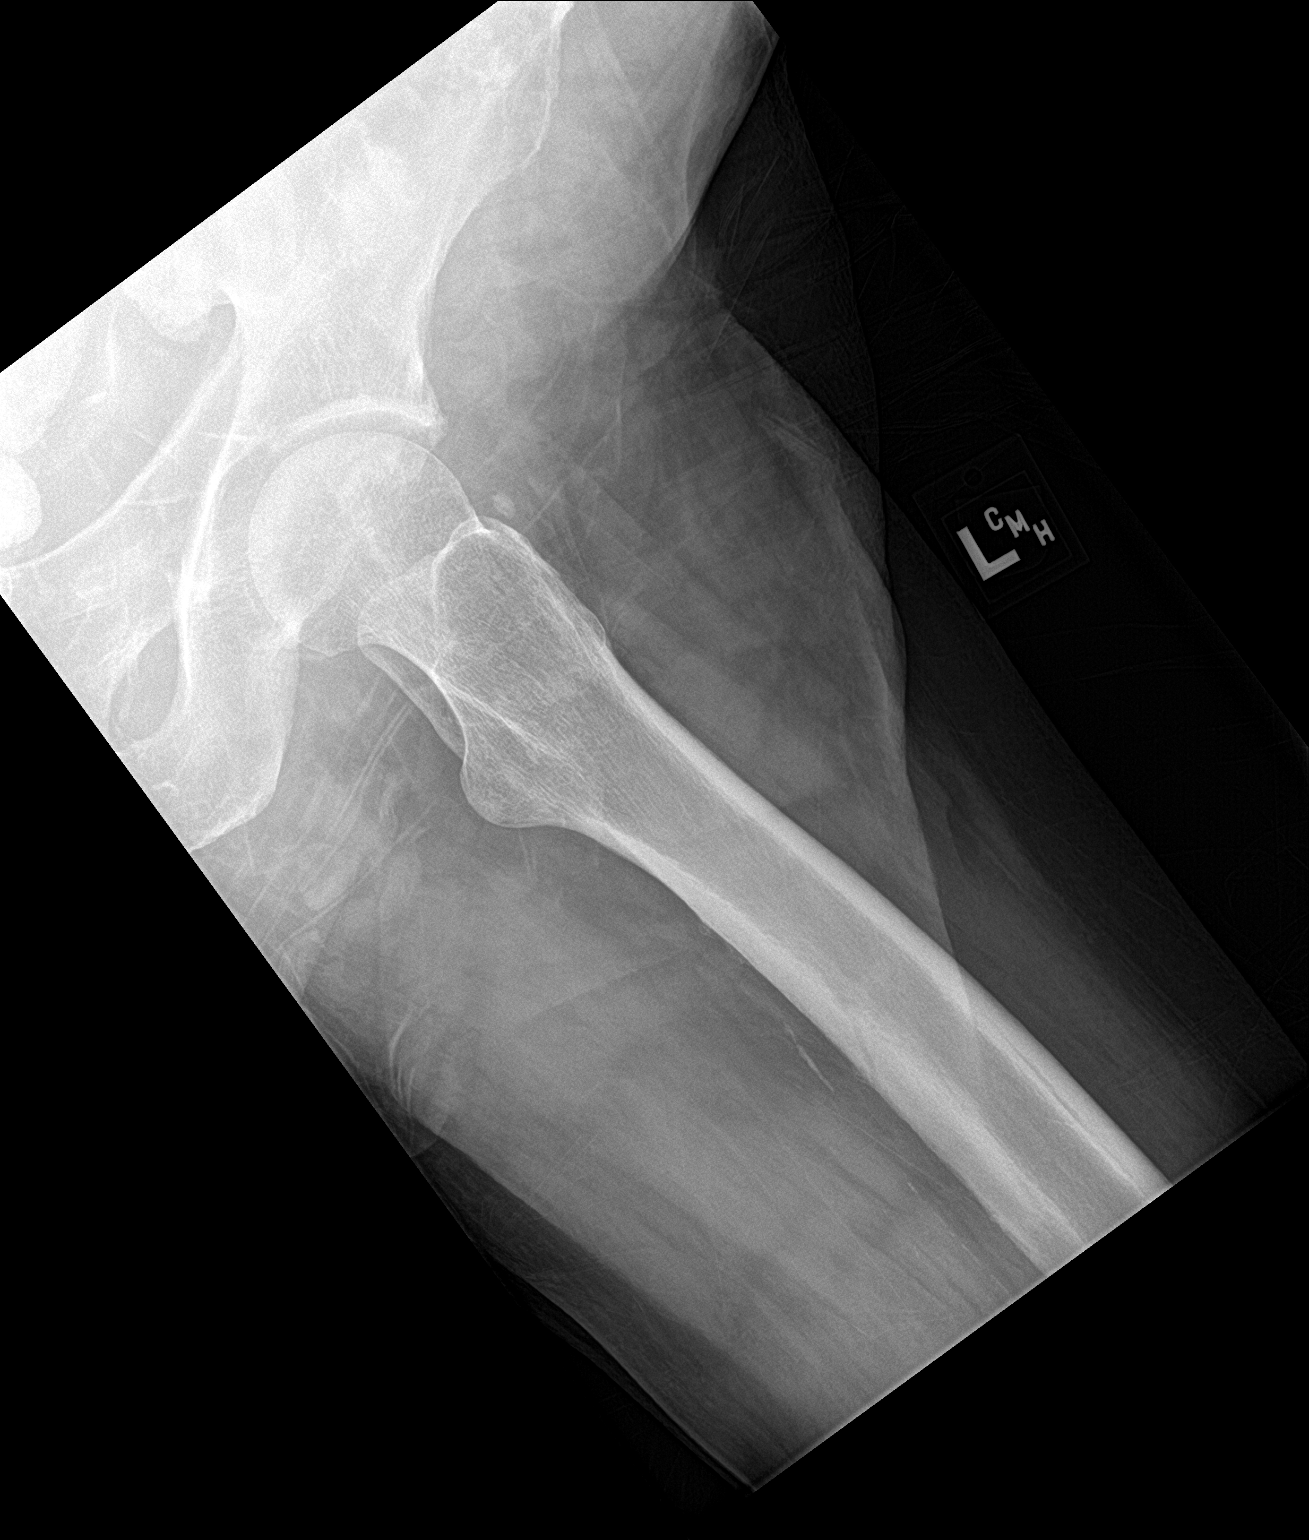

[3 of 3 positions shown; findings below may reference images not displayed]

FINDINGS: No acute fracture. Femoral head is well seated in the acetabulum.
Mild acetabular spurring. Mild spurring of the pubic symphysis
suggesting remote prior injury. No symphyseal or sacroiliac joint
disruption. The bones are under mineralized. There is barium in the
colon from recent barium swallow.
IMPRESSION: No fracture of the pelvis or left hip.

## 2020-10-19 MED ORDER — IBUPROFEN 800 MG PO TABS
800.0000 mg | ORAL_TABLET | Freq: Once | ORAL | Status: AC
  Administered 2020-10-19: 800 mg via ORAL
  Filled 2020-10-19: qty 1

## 2020-10-19 MED ORDER — HYDROCODONE-ACETAMINOPHEN 5-325 MG PO TABS
1.0000 | ORAL_TABLET | Freq: Once | ORAL | Status: AC
  Administered 2020-10-19: 1 via ORAL
  Filled 2020-10-19: qty 1

## 2020-10-19 NOTE — ED Triage Notes (Signed)
Pt presents to ED via [facility] for sliding out of bed . Pt c/o left leg and rib pain. Pt states he barely hit his head on the floor.

## 2020-10-19 NOTE — Plan of Care (Signed)
  Problem: RH Balance Goal: LTG Patient will maintain dynamic sitting balance (PT) Description: LTG:  Patient will maintain dynamic sitting balance with assistance during mobility activities (PT) Outcome: Not Met (add Reason) Note: Poor progress due to continued awareness deficits  Goal: LTG Patient will maintain dynamic standing balance (PT) Description: LTG:  Patient will maintain dynamic standing balance with assistance during mobility activities (PT) Outcome: Not Met (add Reason)   Problem: Sit to Stand Goal: LTG:  Patient will perform sit to stand with assistance level (PT) Description: LTG:  Patient will perform sit to stand with assistance level (PT) Outcome: Not Met (add Reason)   Problem: RH Bed Mobility Goal: LTG Patient will perform bed mobility with assist (PT) Description: LTG: Patient will perform bed mobility with assistance, with/without cues (PT). Outcome: Not Met (add Reason)   Problem: RH Bed to Chair Transfers Goal: LTG Patient will perform bed/chair transfers w/assist (PT) Description: LTG: Patient will perform bed to chair transfers with assistance (PT). Outcome: Not Met (add Reason)   Problem: RH Car Transfers Goal: LTG Patient will perform car transfers with assist (PT) Description: LTG: Patient will perform car transfers with assistance (PT). Outcome: Not Met (add Reason)   Problem: RH Ambulation Goal: LTG Patient will ambulate in controlled environment (PT) Description: LTG: Patient will ambulate in a controlled environment, # of feet with assistance (PT). Outcome: Not Met (add Reason)   Problem: RH Wheelchair Mobility Goal: LTG Patient will propel w/c in controlled environment (PT) Description: LTG: Patient will propel wheelchair in controlled environment, # of feet with assist (PT) Outcome: Not Met (add Reason) Goal: LTG Patient will propel w/c in home environment (PT) Description: LTG: Patient will propel wheelchair in home environment, # of feet  with assistance (PT). Outcome: Not Met (add Reason)

## 2020-10-19 NOTE — ED Notes (Signed)
Girlfriend Clydie Braun updated at this time.

## 2020-10-19 NOTE — Discharge Instructions (Addendum)
The x-rays of your left knee, left hip and CT of your head are reassuring.  According to your previous x-rays, you have 2 broken ribs on the left.  Please follow-up with your primary care provider for recheck.  Return to emergency department for any new or worsening symptoms.

## 2020-10-19 NOTE — ED Provider Notes (Signed)
[facility] EMERGENCY DEPARTMENT Provider Note   CSN: [id number] Arrival date & time: 10/19/20  1250     History Chief Complaint  Patient presents with   [name]    Brent [name] is a 55 y.o. male.   Fall Associated symptoms include chest pain (left lateral chest pain). Pertinent negatives include no abdominal pain, no headaches and no shortness of breath.       Brent [name] is a 55 y.o. male who is from nursing home for evaluation of a fall that occurred this morning.  States that he was standing beside his bed when he slipped and fell in the floor.  States his left leg "bent behind me."  He also complains of left knee, hip, and left lateral chest pain.  He had x-rays earlier today from the facility that shows acute fractures of the ninth and 10th ribs.  He denies loss of consciousness, but states that he believes he did strike his head on the floor.  He denies shortness of breath, abdominal pain, back pain, neck pain or nausea vomiting.  No numbness or weakness of the extremities.  He states he is at SNF recovering from a recent stroke 08/2020   Past Medical History:  Diagnosis Date   Aplastic anemia, unspecified (HCC)    Chest pain, unspecified    Chronic airway obstruction, not elsewhere classified    Cocaine substance abuse (HCC)    Neuropathy    Other and unspecified hyperlipidemia    Shortness of breath    Stroke (HCC) 06/17/2020   Tobacco use disorder    Type II or unspecified type diabetes mellitus without mention of complication, not stated as uncontrolled    Unspecified epilepsy without mention of intractable epilepsy    Unspecified essential hypertension     Patient Active Problem List   Diagnosis Date Noted   Dysphagia, post-stroke    Adjustment disorder with mixed anxiety and depressed mood    Acute blood loss anemia 10/02/2020   PEG (percutaneous endoscopic gastrostomy) status (HCC)    Malnutrition of moderate degree 09/30/2020   S/P percutaneous  endoscopic gastrostomy (PEG) tube placement (HCC)    Labile blood glucose    Leukocytosis    Goals of care, counseling/discussion 09/12/2020   Acute stroke due to ischemia (HCC) 09/11/2020   Acute respiratory failure with hypoxia (HCC) 09/11/2020   Aspiration pneumonitis (HCC) 09/11/2020   Dysarthria 09/11/2020   Sepsis due to undetermined organism (HCC) 09/11/2020   Cocaine abuse (HCC)    Acute CVA (cerebrovascular accident) (HCC) 07/25/2020   Acute on chronic respiratory failure with hypoxia (HCC) 11/04/2019   Rhinovirus infection 11/04/2019   Essential hypertension 11/04/2019   CAP (community acquired pneumonia) 11/03/2019   Diabetic polyneuropathy associated with diabetes mellitus due to underlying condition (HCC) 02/06/2019   Localization-related (focal) (partial) symptomatic epilepsy and epileptic syndromes with complex partial seizures, intractable, without status epilepticus (HCC) 02/06/2019   Mixed diabetic hyperlipidemia associated with type 2 diabetes mellitus (HCC) 11/26/2018   Cough 11/03/2018   Obstructive sleep apnea 11/03/2018   Dysphagia-----s/p Prior Uvulectomy and Now with Acute CVA 09/27/2018   Eczema 09/27/2018   Right leg weakness 01/19/2018   History of arthroscopy of knee 11/15/2017   Complex tear of lateral meniscus of right knee as current injury 10/27/2017   Effusion of right knee 07/26/2017   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   Cellulitis 07/22/2017   Primary osteoarthritis involving multiple joints 06/22/2017   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 02/01/2017   Right knee pain 02/01/2017   Acute bronchitis 12/07/2016   Acute idiopathic gout of left foot 12/07/2016   Anxiety, generalized 12/07/2016   COPD with acute exacerbation (HCC) 12/07/2016   Seizures (HCC) 12/07/2016   CAD (coronary artery disease) 12/19/2010   Uncontrolled type 2 diabetes mellitus, with long-term current use of insulin (HCC) 12/19/2010    Hypertension associated with diabetes (HCC) 12/19/2010   GERD (gastroesophageal reflux disease) 12/19/2010   Hypertriglyceridemia 12/19/2010   Tobacco abuse 12/19/2010    Past Surgical History:  Procedure Laterality Date   BIOPSY  10/02/2020   Procedure: BIOPSY;  Surgeon: Jenel Lucksunningham, Scott E, MD;  [facility];  Service: Gastroenterology;;   CARDIAC CATHETERIZATION  10/02/2010    Nonobstructive mild coronary plaque   ESOPHAGOGASTRODUODENOSCOPY N/A 10/02/2020   Procedure: ESOPHAGOGASTRODUODENOSCOPY (EGD);  Surgeon: Jenel Lucksunningham, Scott E, MD;  [facility];  Service: Gastroenterology;  Laterality: N/A;   IR GASTROSTOMY TUBE MOD SED  09/24/2020   KNEE SURGERY Bilateral 2018   Repair   PERFORATED VISCUS SURGERY     MULTIPLE FRACTURES       Family History  Problem Relation Age of Onset   Heart attack Father    COPD Father    Coronary artery disease Mother     Social History   Tobacco Use   Smoking status: Every Day    Packs/day: 1.00    Years: 30.00    Pack years: 30.00    Types: Cigarettes   Smokeless tobacco: Never  Substance Use Topics   Alcohol use: Yes    Comment: drinks beer on the weekends   Drug use: Yes    Types: Cocaine    Home Medications Prior to Admission medications   Medication Sig Start Date End Date Taking? Authorizing Provider  acetaminophen (TYLENOL) 160 MG/5ML solution Place 20.3 mLs (650 mg total) into feeding tube every 4 (four) hours as needed for moderate pain or fever. 09/26/20   Pennie BanterGriffith, Kelly A, DO  albuterol (VENTOLIN HFA) 108 (90 Base) MCG/ACT inhaler Inhale 2 puffs into the lungs every 6 (six) hours as needed for wheezing or shortness of breath. 10/17/20   Angiulli, Mcarthur Rossettianiel J, PA-C  ALPRAZolam Prudy Feeler(XANAX) 0.25 MG tablet Place 1 tablet (0.25 mg total) into feeding tube 3 (three) times daily as needed for anxiety. 10/17/20   Angiulli, Mcarthur Rossettianiel J, PA-C  carBAMazepine (TEGRETOL) 100 MG/5ML suspension Place 10 mLs (200 mg total) into feeding  tube every 6 (six) hours. 09/26/20   Pennie BanterGriffith, Kelly A, DO  clopidogrel (PLAVIX) 75 MG tablet Place 1 tablet (75 mg total) into feeding tube daily. 09/27/20   Pennie BanterGriffith, Kelly A, DO  escitalopram (LEXAPRO) 10 MG tablet Place 1 tablet (10 mg total) into feeding tube daily. 10/18/20   Angiulli, Mcarthur Rossettianiel J, PA-C  fenofibrate 160 MG tablet Take 160 mg by mouth daily. 12/11/16   [provider]  HYDROcodone-acetaminophen (NORCO/VICODIN) 5-325 MG tablet Place 1 tablet into feeding tube daily as needed for moderate pain. 10/17/20   Angiulli, Mcarthur Rossettianiel J, PA-C  insulin detemir (LEVEMIR) 100 UNIT/ML injection Inject 0.06 mLs (6 Units total) into the skin 2 (two) times daily. 10/17/20   Angiulli, Mcarthur Rossettianiel J, PA-C  lidocaine (LIDODERM) 5 % Place 1 patch onto the skin daily. Remove & Discard patch within 12 hours or as directed by MD 10/18/20   Angiulli, Mcarthur Rossettianiel J, PA-C  nitroGLYCERIN (NITROSTAT) 0.4 MG SL tablet Place 1 tablet (0.4 mg total) under the tongue every 5 (five)  minutes as needed for chest pain. 09/26/20   Pennie Banter, DO  Nutritional Supplements (FEEDING SUPPLEMENT, OSMOLITE 1.5 CAL,) LIQD Place 355 mLs into feeding tube 4 (four) times daily. 10/17/20   Angiulli, Mcarthur Rossetti, PA-C  pantoprazole sodium (PROTONIX) 40 mg PACK Place 20 mLs (40 mg total) into feeding tube 2 (two) times daily. 10/17/20   Angiulli, Mcarthur Rossetti, PA-C  polyethylene glycol (MIRALAX / GLYCOLAX) 17 g packet Place 17 g into feeding tube daily. 09/27/20   Pennie Banter, DO  propranolol (INDERAL) 10 MG tablet Place 1 tablet (10 mg total) into feeding tube 2 (two) times daily. 10/17/20   Angiulli, Mcarthur Rossetti, PA-C  QUEtiapine (SEROQUEL) 100 MG tablet Place 1 tablet (100 mg total) into feeding tube at bedtime. 10/17/20   Angiulli, Mcarthur Rossetti, PA-C  rosuvastatin (CRESTOR) 20 MG tablet Place 1 tablet (20 mg total) into feeding tube daily. 10/18/20   Angiulli, Mcarthur Rossetti, PA-C  Water For Irrigation, Sterile (FREE WATER) SOLN Place 230 mLs into feeding tube 4  (four) times daily. 10/17/20   Angiulli, Mcarthur Rossetti, PA-C    Allergies    Patient has no known allergies.  Review of Systems   Review of Systems  Constitutional:  Negative for chills, fatigue and fever.  Respiratory:  Negative for chest tightness and shortness of breath.   Cardiovascular:  Positive for chest pain (left lateral chest pain). Negative for palpitations.  Gastrointestinal:  Negative for abdominal pain, nausea and vomiting.  Genitourinary:  Negative for dysuria and flank pain.  Musculoskeletal:  Positive for arthralgias (left hip and knee pain). Negative for back pain, myalgias, neck pain and neck stiffness.  Skin:  Negative for rash and wound.  Neurological:  Negative for dizziness, syncope, weakness, numbness and headaches.  Hematological:  Does not bruise/bleed easily.  Psychiatric/Behavioral:  Negative for confusion.    Physical Exam Updated Vital Signs BP 135/71 (BP Location: Right Arm)   Pulse 80   Temp 97.7 F (36.5 C) (Oral)   Resp 16   Ht 6' (1.829 m)   Wt 77.1 kg   SpO2 93%   BMI 23.06 kg/m   Physical Exam Vitals and nursing note reviewed.  Constitutional:      General: He is not in acute distress.    Appearance: Normal appearance. He is not toxic-appearing.  HENT:     Head: Atraumatic.  Eyes:     Extraocular Movements: Extraocular movements intact.     Pupils: Pupils are equal, round, and reactive to light.  Cardiovascular:     Rate and Rhythm: Normal rate and regular rhythm.     Pulses: Normal pulses.  Pulmonary:     Effort: Pulmonary effort is normal. No respiratory distress.  Chest:     Chest wall: Tenderness (focal ttp of the lateral left chest wall.  no bruising or crepitus.) present.  Abdominal:     Palpations: Abdomen is soft.     Tenderness: There is no abdominal tenderness. There is no right CVA tenderness or left CVA tenderness.  Musculoskeletal:        General: Tenderness and signs of injury present.     Cervical back: Full passive  range of motion without pain and normal range of motion. No tenderness.     Right lower leg: No edema.     Left lower leg: No edema.     Comments: Ttp of medial left knee.  Minimal discomfort noted with valgus and varus stress.  No effusion.  Pain on ROM  of the left hip.  No shortening or external rotation  Skin:    General: Skin is warm.     Capillary Refill: Capillary refill takes less than 2 seconds.     Findings: No rash.  Neurological:     General: No focal deficit present.     Mental Status: He is alert.     Sensory: No sensory deficit.     Motor: No weakness.    ED Results / Procedures / Treatments   Labs (all labs ordered are listed, but only abnormal results are displayed) Labs Reviewed - No data to display  EKG None  Radiology CT HEAD WO CONTRAST ( )  Result Date: 10/19/2020 CLINICAL DATA:  Fall with head trauma. EXAM: CT HEAD WITHOUT CONTRAST TECHNIQUE: Contiguous axial images were obtained from the base of the skull through the vertex without intravenous contrast. COMPARISON:  Head CT September 11, 2020 FINDINGS: Motion degraded examination. Brain: Age related global parenchymal volume loss with ex vacuo dilatation of the ventricular system. Similar severe burden of chronic ischemic small vessel white matter disease. Chronic lacunar type infarcts in the bilateral basal ganglia and thalami. Chronic right frontal infarction. No evidence of acute large vascular territory infarction, hemorrhage, hydrocephalus, extra-axial collection or mass lesion/mass effect. Vascular: No hyperdense vessel. Atherosclerotic calcifications of the internal carotid and vertebral arteries at the skull base. Skull: Normal. Negative for fracture or focal lesion. Sinuses/Orbits: Visualized portions of the paranasal sinuses and mastoid air cells are predominantly clear. Orbits are grossly unremarkable. Other: None IMPRESSION: 1. No definite acute intracranial abnormality on this motion degraded examination. 2.  Similar age related global parenchymal volume loss and severe chronic ischemic small vessel white matter disease. 3. Chronic lacunar type infarcts in the bilateral basal ganglia and thalami and chronic right frontal lobe infarcts. Electronically Signed   By: Maudry Mayhew M.D.   On: 10/19/2020 15:48   DG Knee Complete 4 Views Left  Result Date: 10/19/2020 CLINICAL DATA:  Fall, slid out of bed.  Left hip and knee pain. EXAM: LEFT KNEE - COMPLETE 4+ VIEW COMPARISON:  None. FINDINGS: No evidence of fracture, dislocation, or joint effusion. Mild degenerative change with peripheral spurring. Faint chondrocalcinosis. Minimal medial soft tissue edema. IMPRESSION: 1. Soft tissue edema without acute fracture or dislocation. 2. Mild osteoarthritis and chondrocalcinosis. Electronically Signed   By: Narda Rutherford M.D.   On: 10/19/2020 16:15            Procedures Procedures   Medications Ordered in ED Medications  ibuprofen (ADVIL) tablet 800 mg (800 mg Oral Given 10/19/20 1443)    ED Course  I have reviewed the triage vital signs and the nursing notes.  Pertinent labs & imaging results that were available during my care of the patient were reviewed by me and considered in my medical decision making (see chart for details).    MDM Rules/Calculators/A&P                           Pt here from local SNF for evaluation of a fall from standing position.  Complains of left chest, hip and knee pain.  Possible head injury, but pt denies LOC and headache.  He had chest XR at facility (report image shown above) that shows acute rib fx's w/o pneumothorax.   Here, x-rays of the knee and hip are reassuring.  CT of the head without evidence of acute intracranial injury.  On recheck, patient resting comfortably, vital signs  reassuring.  No hypoxia or tachypnea.  Pain addressed here with NSAID and opiate.  Database reviewed and patient has prescription for narcotic.  I feel that he is appropriate for discharge  back to facility.  He is agreeable to close outpatient follow-up with PCP.  Return precautions were discussed.   Final Clinical Impression(s) / ED Diagnoses Final diagnoses:  Fall, initial encounter  Closed fracture of multiple ribs of left side, initial encounter    Rx / DC Orders ED Discharge Orders     None        Pauline Aus, PA-C 10/19/20 1817    Vanetta Mulders, MD 10/20/20 [phone]

## 2020-10-20 ENCOUNTER — Emergency Department (HOSPITAL_COMMUNITY)
Admission: EM | Admit: 2020-10-20 | Discharge: 2020-10-20 | Disposition: A | Discharge status: Home / Self Care | Payer: Medicaid Other | Attending: Emergency Medicine | Admitting: Emergency Medicine

## 2020-10-20 ENCOUNTER — Other Ambulatory Visit: Payer: Self-pay

## 2020-10-20 ENCOUNTER — Encounter (HOSPITAL_COMMUNITY): Payer: Self-pay | Admitting: Emergency Medicine

## 2020-10-20 DIAGNOSIS — Z79899 Other long term (current) drug therapy: Secondary | ICD-10-CM | POA: Diagnosis not present

## 2020-10-20 DIAGNOSIS — E1140 Type 2 diabetes mellitus with diabetic neuropathy, unspecified: Secondary | ICD-10-CM | POA: Insufficient documentation

## 2020-10-20 DIAGNOSIS — J449 Chronic obstructive pulmonary disease, unspecified: Secondary | ICD-10-CM | POA: Insufficient documentation

## 2020-10-20 DIAGNOSIS — I10 Essential (primary) hypertension: Secondary | ICD-10-CM | POA: Insufficient documentation

## 2020-10-20 DIAGNOSIS — R451 Restlessness and agitation: Secondary | ICD-10-CM | POA: Insufficient documentation

## 2020-10-20 DIAGNOSIS — F17210 Nicotine dependence, cigarettes, uncomplicated: Secondary | ICD-10-CM | POA: Diagnosis not present

## 2020-10-20 DIAGNOSIS — Z794 Long term (current) use of insulin: Secondary | ICD-10-CM | POA: Diagnosis not present

## 2020-10-20 DIAGNOSIS — Z7902 Long term (current) use of antithrombotics/antiplatelets: Secondary | ICD-10-CM | POA: Diagnosis not present

## 2020-10-20 MED ORDER — OSMOLITE 1.5 CAL PO LIQD
355.0000 mL | Freq: Once | ORAL | Status: AC
  Administered 2020-10-20: 355 mL
  Filled 2020-10-20: qty 474

## 2020-10-20 MED ORDER — HYDROCODONE-ACETAMINOPHEN 5-325 MG PO TABS
1.0000 | ORAL_TABLET | Freq: Every day | ORAL | Status: DC | PRN
  Administered 2020-10-20: 1
  Filled 2020-10-20: qty 1

## 2020-10-20 NOTE — ED Provider Notes (Signed)
[facility] EMERGENCY DEPARTMENT Provider Note  CSN: [id number] Arrival date & time: 10/20/20 8938    History Chief Complaint  Patient presents with   Agitation    Brent [name] is a 55 y.o. male with history of stroke, living at SNF was in the ED after a fall last night with uncomplicated rib fractures. Today he got angry with the staff at SNF because they would not give him his pain medications or his tube feeds. He became agitated and so they called EMS to bring him back. He has not other acute complaints.    Past Medical History:  Diagnosis Date   Aplastic anemia, unspecified (HCC)    Chest pain, unspecified    Chronic airway obstruction, not elsewhere classified    Cocaine substance abuse (HCC)    Neuropathy    Other and unspecified hyperlipidemia    Shortness of breath    Stroke (HCC) 06/17/2020   Tobacco use disorder    Type II or unspecified type diabetes mellitus without mention of complication, not stated as uncontrolled    Unspecified epilepsy without mention of intractable epilepsy    Unspecified essential hypertension     Past Surgical History:  Procedure Laterality Date   BIOPSY  10/02/2020   Procedure: BIOPSY;  Surgeon: Jenel Lucks, MD;  Location: [facility] ENDOSCOPY;  Service: Gastroenterology;;   CARDIAC CATHETERIZATION  10/02/2010    Nonobstructive mild coronary plaque   ESOPHAGOGASTRODUODENOSCOPY N/A 10/02/2020   Procedure: ESOPHAGOGASTRODUODENOSCOPY (EGD);  Surgeon: Jenel Lucks, MD;  Location: [facility] ENDOSCOPY;  Service: Gastroenterology;  Laterality: N/A;   IR GASTROSTOMY TUBE MOD SED  09/24/2020   KNEE SURGERY Bilateral 2018   Repair   PERFORATED VISCUS SURGERY     MULTIPLE FRACTURES    Family History  Problem Relation Age of Onset   Heart attack Father    COPD Father    Coronary artery disease Mother     Social History   Tobacco Use   Smoking status: Every Day    Packs/day: 1.00    Years: 30.00    Pack years: 30.00    Types:  Cigarettes   Smokeless tobacco: Never  Substance Use Topics   Alcohol use: Yes    Comment: drinks beer on the weekends   Drug use: Yes    Types: Cocaine     Home Medications Prior to Admission medications   Medication Sig Start Date End Date Taking? Authorizing Provider  acetaminophen (TYLENOL) 160 MG/5ML solution Place 20.3 mLs (650 mg total) into feeding tube every 4 (four) hours as needed for moderate pain or fever. 09/26/20   Pennie Banter, DO  albuterol (VENTOLIN HFA) 108 (90 Base) MCG/ACT inhaler Inhale 2 puffs into the lungs every 6 (six) hours as needed for wheezing or shortness of breath. 10/17/20   Angiulli, Mcarthur Rossetti, PA-C  ALPRAZolam Prudy Feeler) 0.25 MG tablet Place 1 tablet (0.25 mg total) into feeding tube 3 (three) times daily as needed for anxiety. 10/17/20   Angiulli, Mcarthur Rossetti, PA-C  carBAMazepine (TEGRETOL) 100 MG/5ML suspension Place 10 mLs (200 mg total) into feeding tube every 6 (six) hours. 09/26/20   Pennie Banter, DO  clopidogrel (PLAVIX) 75 MG tablet Place 1 tablet (75 mg total) into feeding tube daily. 09/27/20   Pennie Banter, DO  escitalopram (LEXAPRO) 10 MG tablet Place 1 tablet (10 mg total) into feeding tube daily. 10/18/20   Angiulli, Mcarthur Rossetti, PA-C  fenofibrate 160 MG tablet Take 160 mg by mouth daily.  12/11/16   [provider]  HYDROcodone-acetaminophen (NORCO/VICODIN) 5-325 MG tablet Place 1 tablet into feeding tube daily as needed for moderate pain. 10/17/20   Angiulli, Mcarthur Rossetti, PA-C  insulin detemir (LEVEMIR) 100 UNIT/ML injection Inject 0.06 mLs (6 Units total) into the skin 2 (two) times daily. 10/17/20   Angiulli, Mcarthur Rossetti, PA-C  lidocaine (LIDODERM) 5 % Place 1 patch onto the skin daily. Remove & Discard patch within 12 hours or as directed by MD 10/18/20   Angiulli, Mcarthur Rossetti, PA-C  nitroGLYCERIN (NITROSTAT) 0.4 MG SL tablet Place 1 tablet (0.4 mg total) under the tongue every 5 (five) minutes as needed for chest pain. 09/26/20   Pennie Banter, DO   Nutritional Supplements (FEEDING SUPPLEMENT, OSMOLITE 1.5 CAL,) LIQD Place 355 mLs into feeding tube 4 (four) times daily. 10/17/20   Angiulli, Mcarthur Rossetti, PA-C  pantoprazole sodium (PROTONIX) 40 mg PACK Place 20 mLs (40 mg total) into feeding tube 2 (two) times daily. 10/17/20   Angiulli, Mcarthur Rossetti, PA-C  polyethylene glycol (MIRALAX / GLYCOLAX) 17 g packet Place 17 g into feeding tube daily. 09/27/20   Pennie Banter, DO  propranolol (INDERAL) 10 MG tablet Place 1 tablet (10 mg total) into feeding tube 2 (two) times daily. 10/17/20   Angiulli, Mcarthur Rossetti, PA-C  QUEtiapine (SEROQUEL) 100 MG tablet Place 1 tablet (100 mg total) into feeding tube at bedtime. 10/17/20   Angiulli, Mcarthur Rossetti, PA-C  rosuvastatin (CRESTOR) 20 MG tablet Place 1 tablet (20 mg total) into feeding tube daily. 10/18/20   Angiulli, Mcarthur Rossetti, PA-C  Water For Irrigation, Sterile (FREE WATER) SOLN Place 230 mLs into feeding tube 4 (four) times daily. 10/17/20   Angiulli, Mcarthur Rossetti, PA-C     Allergies    Patient has no known allergies.   Review of Systems   Review of Systems A comprehensive review of systems was completed and negative except as noted in HPI.    Physical Exam BP (!) 139/92   Pulse 87   Temp 97.6 F (36.4 C) (Oral)   Resp 18   Ht 6' (1.829 m)   Wt 77.1 kg   SpO2 94%   BMI 23.06 kg/m   Physical Exam Vitals and nursing note reviewed.  Constitutional:      Appearance: Normal appearance.  HENT:     Head: Normocephalic and atraumatic.     Nose: Nose normal.     Mouth/Throat:     Mouth: Mucous membranes are moist.  Eyes:     Extraocular Movements: Extraocular movements intact.     Conjunctiva/sclera: Conjunctivae normal.  Cardiovascular:     Rate and Rhythm: Normal rate.  Pulmonary:     Effort: Pulmonary effort is normal.     Breath sounds: Normal breath sounds.  Abdominal:     General: Abdomen is flat.     Palpations: Abdomen is soft.     Tenderness: There is no abdominal tenderness.     Comments: PEG  tube in place  Musculoskeletal:        General: No swelling. Normal range of motion.     Cervical back: Neck supple.  Skin:    General: Skin is warm and dry.  Neurological:     Mental Status: He is alert.     Comments: L hemiparesis is chronic  Psychiatric:        Mood and Affect: Mood normal.     ED Results / Procedures / Treatments   Labs (all labs ordered are  listed, but only abnormal results are displayed) Labs Reviewed - No data to display  EKG None  Radiology CT HEAD WO CONTRAST ( )  Result Date: 10/19/2020 CLINICAL DATA:  Fall with head trauma. EXAM: CT HEAD WITHOUT CONTRAST TECHNIQUE: Contiguous axial images were obtained from the base of the skull through the vertex without intravenous contrast. COMPARISON:  Head CT September 11, 2020 FINDINGS: Motion degraded examination. Brain: Age related global parenchymal volume loss with ex vacuo dilatation of the ventricular system. Similar severe burden of chronic ischemic small vessel white matter disease. Chronic lacunar type infarcts in the bilateral basal ganglia and thalami. Chronic right frontal infarction. No evidence of acute large vascular territory infarction, hemorrhage, hydrocephalus, extra-axial collection or mass lesion/mass effect. Vascular: No hyperdense vessel. Atherosclerotic calcifications of the internal carotid and vertebral arteries at the skull base. Skull: Normal. Negative for fracture or focal lesion. Sinuses/Orbits: Visualized portions of the paranasal sinuses and mastoid air cells are predominantly clear. Orbits are grossly unremarkable. Other: None IMPRESSION: 1. No definite acute intracranial abnormality on this motion degraded examination. 2. Similar age related global parenchymal volume loss and severe chronic ischemic small vessel white matter disease. 3. Chronic lacunar type infarcts in the bilateral basal ganglia and thalami and chronic right frontal lobe infarcts. Electronically Signed   By: Maudry Mayhew M.D.    On: 10/19/2020 15:48   DG Knee Complete 4 Views Left  Result Date: 10/19/2020 CLINICAL DATA:  Fall, slid out of bed.  Left hip and knee pain. EXAM: LEFT KNEE - COMPLETE 4+ VIEW COMPARISON:  None. FINDINGS: No evidence of fracture, dislocation, or joint effusion. Mild degenerative change with peripheral spurring. Faint chondrocalcinosis. Minimal medial soft tissue edema. IMPRESSION: 1. Soft tissue edema without acute fracture or dislocation. 2. Mild osteoarthritis and chondrocalcinosis. Electronically Signed   By: Narda Rutherford M.D.   On: 10/19/2020 16:15   DG Hip Unilat W or Wo Pelvis 2-3 Views Left  Result Date: 10/19/2020 CLINICAL DATA:  Fall.  Slid out of bed.  Left hip pain. EXAM: DG HIP (WITH OR WITHOUT PELVIS) 2-3V LEFT COMPARISON:  None. FINDINGS: No acute fracture. Femoral head is well seated in the acetabulum. Mild acetabular spurring. Mild spurring of the pubic symphysis suggesting remote prior injury. No symphyseal or sacroiliac joint disruption. The bones are under mineralized. There is barium in the colon from recent barium swallow. IMPRESSION: No fracture of the pelvis or left hip. Electronically Signed   By: Narda Rutherford M.D.   On: 10/19/2020 16:20    Procedures Procedures  Medications Ordered in the ED Medications  HYDROcodone-acetaminophen (NORCO/VICODIN) 5-325 MG per tablet 1 tablet (1 tablet Per Tube Given 10/20/20 2038)  feeding supplement (OSMOLITE 1.5 CAL) liquid 355 mL (0 mLs Per Tube Stopped 10/20/20 2040)     MDM Rules/Calculators/A&P MDM No acute complaints today. Given his usual pain medication and tube feedings. He is ready to go back to SNF.   ED Course  I have reviewed the triage vital signs and the nursing notes.  Pertinent labs & imaging results that were available during my care of the patient were reviewed by me and considered in my medical decision making (see chart for details).     Final Clinical Impression(s) / ED Diagnoses Final diagnoses:   Agitation    Rx / DC Orders ED Discharge Orders     None        Pollyann Savoy, MD 10/20/20 2117

## 2020-10-20 NOTE — ED Notes (Signed)
Per EMS when they arrives pt was upset with staff at [facility]. EMS noted that staff was talking down to pt and was refusing to given him his PRN medications and tube feed.

## 2020-10-20 NOTE — ED Triage Notes (Signed)
Pt sent to ED by [facility] with staff complaints of agitation. Per staff pt was aggressive to staff and pt needs psych eval. Pt arrives alert and oriented, pt states he asked for facility to feed him (by peg tube) and something for pain.

## 2020-10-21 ENCOUNTER — Emergency Department (HOSPITAL_COMMUNITY)
Admission: EM | Admit: 2020-10-21 | Discharge: 2020-10-21 | Disposition: A | Discharge status: Home / Self Care | Payer: Medicaid Other | Attending: Student | Admitting: Student

## 2020-10-21 ENCOUNTER — Other Ambulatory Visit: Payer: Self-pay

## 2020-10-21 ENCOUNTER — Emergency Department (HOSPITAL_COMMUNITY): Payer: Medicaid Other

## 2020-10-21 ENCOUNTER — Encounter (HOSPITAL_COMMUNITY): Payer: Self-pay

## 2020-10-21 ENCOUNTER — Emergency Department (HOSPITAL_COMMUNITY)
Admission: EM | Admit: 2020-10-21 | Discharge: 2020-10-22 | Disposition: A | Discharge status: Home / Self Care | Payer: Medicaid Other | Source: Home / Self Care | Attending: Emergency Medicine | Admitting: Emergency Medicine

## 2020-10-21 DIAGNOSIS — W06XXXA Fall from bed, initial encounter: Secondary | ICD-10-CM | POA: Insufficient documentation

## 2020-10-21 DIAGNOSIS — E1140 Type 2 diabetes mellitus with diabetic neuropathy, unspecified: Secondary | ICD-10-CM | POA: Insufficient documentation

## 2020-10-21 DIAGNOSIS — S0990XA Unspecified injury of head, initial encounter: Secondary | ICD-10-CM | POA: Insufficient documentation

## 2020-10-21 DIAGNOSIS — J449 Chronic obstructive pulmonary disease, unspecified: Secondary | ICD-10-CM | POA: Insufficient documentation

## 2020-10-21 DIAGNOSIS — Y92122 Bedroom in nursing home as the place of occurrence of the external cause: Secondary | ICD-10-CM | POA: Insufficient documentation

## 2020-10-21 DIAGNOSIS — W19XXXA Unspecified fall, initial encounter: Secondary | ICD-10-CM

## 2020-10-21 DIAGNOSIS — Z794 Long term (current) use of insulin: Secondary | ICD-10-CM | POA: Diagnosis not present

## 2020-10-21 DIAGNOSIS — I10 Essential (primary) hypertension: Secondary | ICD-10-CM | POA: Insufficient documentation

## 2020-10-21 DIAGNOSIS — Z79899 Other long term (current) drug therapy: Secondary | ICD-10-CM | POA: Diagnosis not present

## 2020-10-21 DIAGNOSIS — F17210 Nicotine dependence, cigarettes, uncomplicated: Secondary | ICD-10-CM | POA: Insufficient documentation

## 2020-10-21 DIAGNOSIS — E1142 Type 2 diabetes mellitus with diabetic polyneuropathy: Secondary | ICD-10-CM | POA: Diagnosis not present

## 2020-10-21 DIAGNOSIS — I2510 Atherosclerotic heart disease of native coronary artery without angina pectoris: Secondary | ICD-10-CM | POA: Insufficient documentation

## 2020-10-21 DIAGNOSIS — K9423 Gastrostomy malfunction: Secondary | ICD-10-CM

## 2020-10-21 IMAGING — CT CT HEAD W/O CM
3 series · 15 of 47 positions shown, 18 images · non-contrast
Comparison: CT head [DATE].

CLINICAL DATA: Head trauma.

EXAM:
CT HEAD WITHOUT CONTRAST
TECHNIQUE: Contiguous axial images were obtained from the base of the skull
through the vertex without intravenous contrast.

[Series 2: head w o · axial · 0.41mm/px · z∈[+136,+271]mm · 9 of 33 slices shown, 12 images]
[im 3/33  brain]
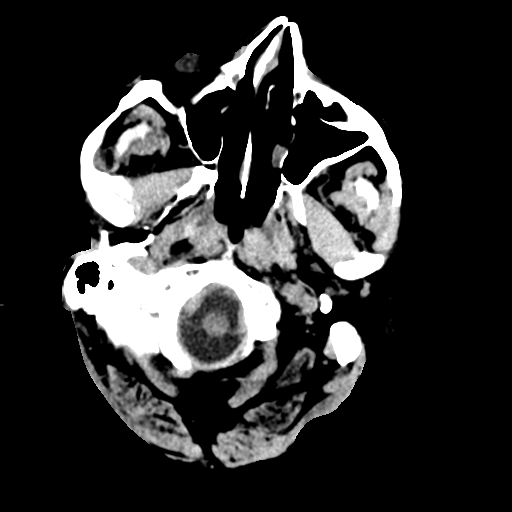
[im 3/33  bone]
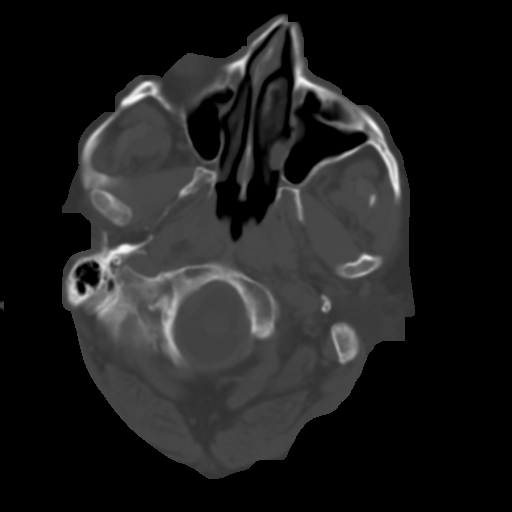
[im 6/33  brain]
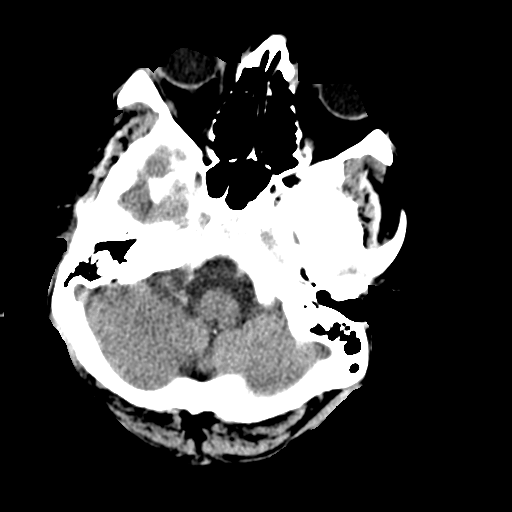
[im 9/33  brain]
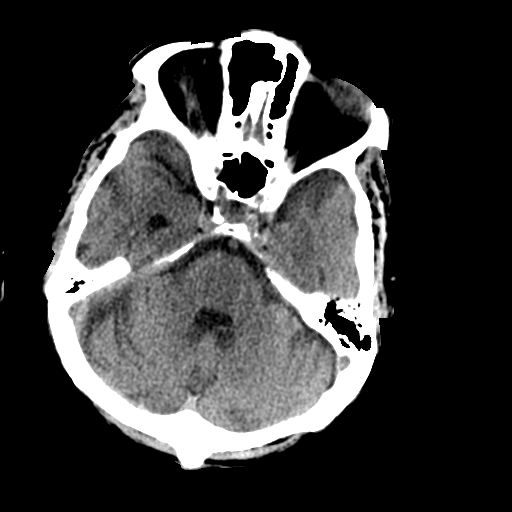
[im 13/33  brain]
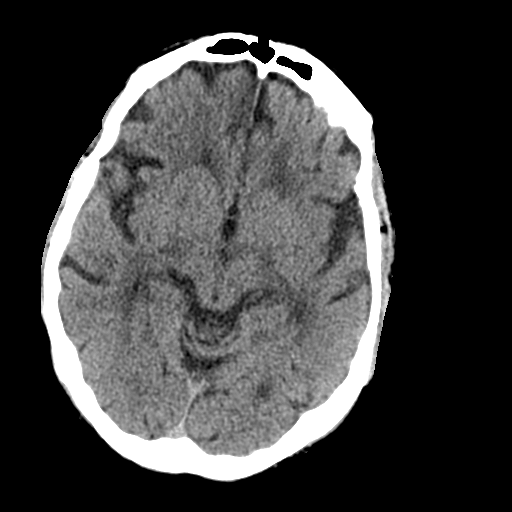
[im 17/33  brain]
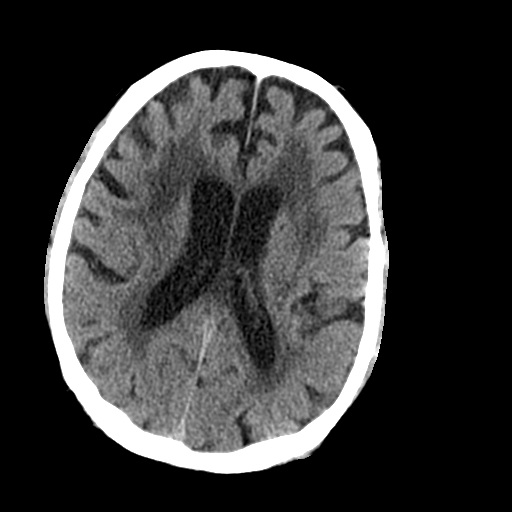
[im 17/33  bone]
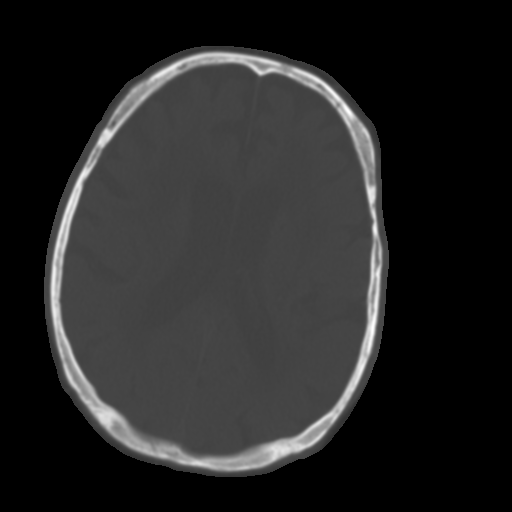
[im 20/33  brain]
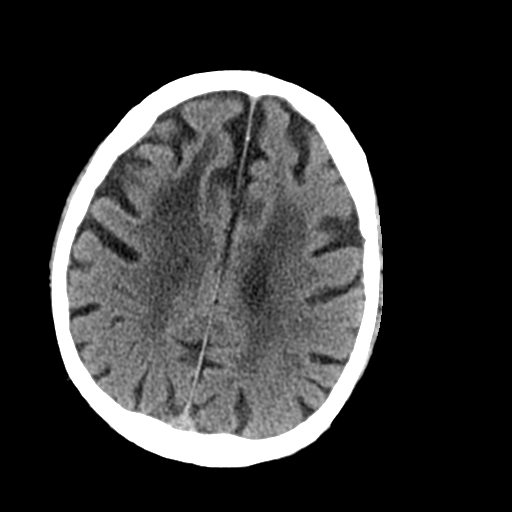
[im 24/33  brain]
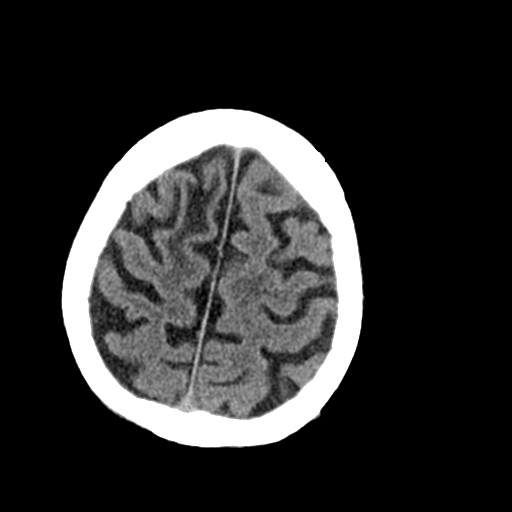
[im 27/33  brain]
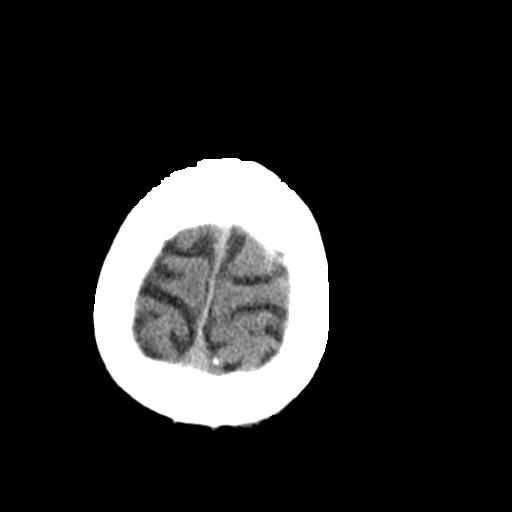
[im 30/33  brain]
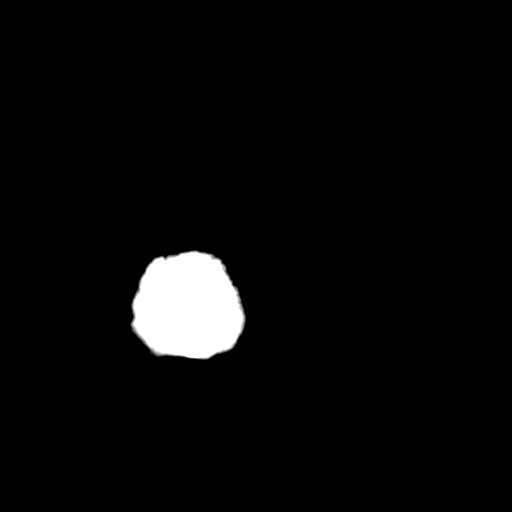
[im 30/33  bone]
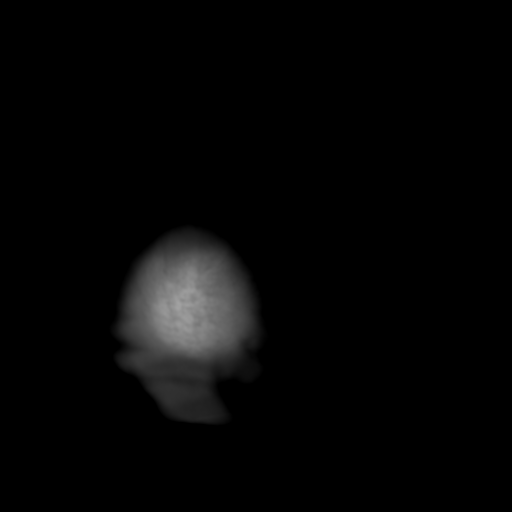

[Series 4: coronal soft · coronal · 0.32mm/px · 3 of 68 slices shown]
[im 23/68  brain]
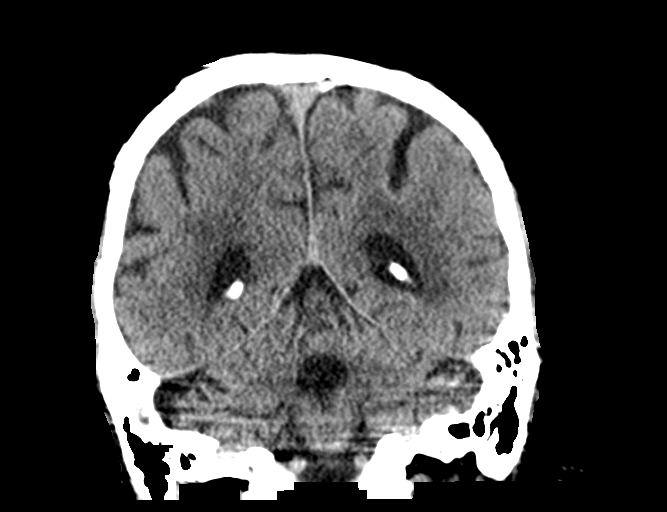
[im 30/68  brain]
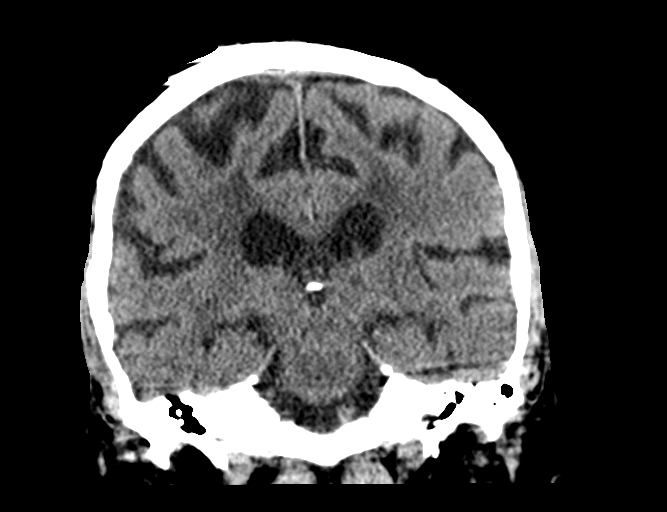
[im 38/68  brain]
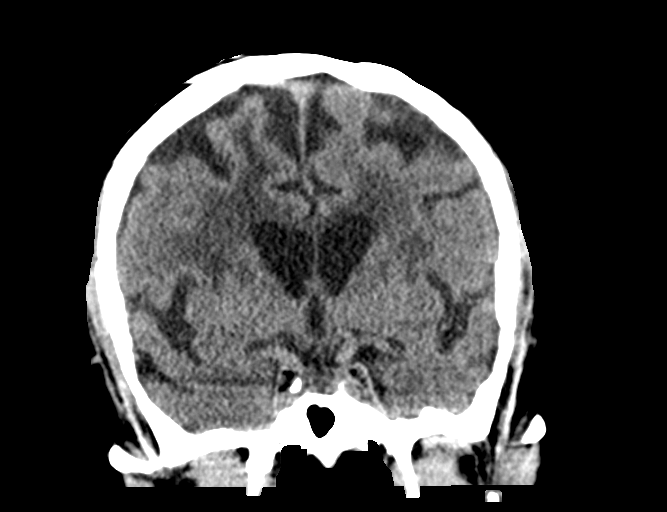

[Series 5: sagittal soft · sagittal · 0.32mm/px · 3 of 64 slices shown]
[im 22/64  brain]
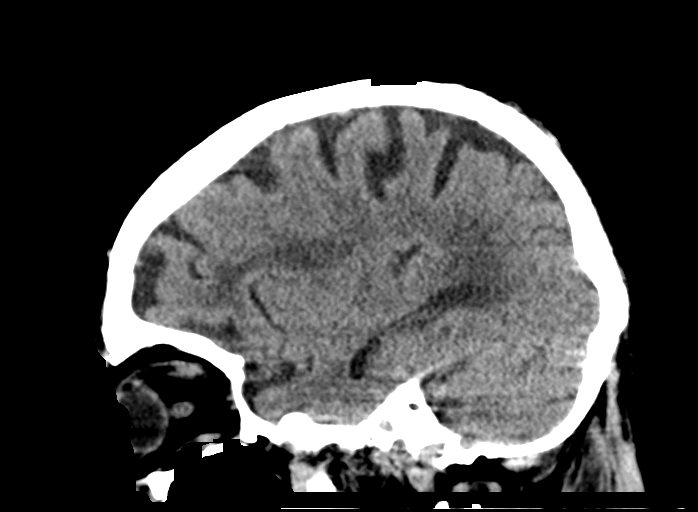
[im 32/64  brain]
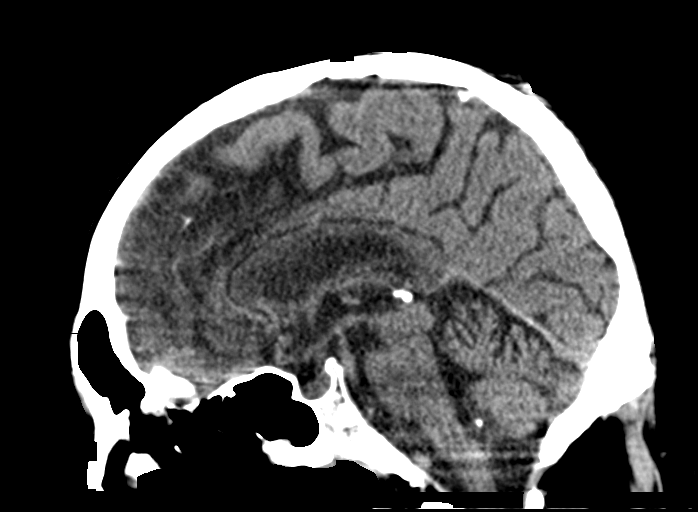
[im 43/64  brain]
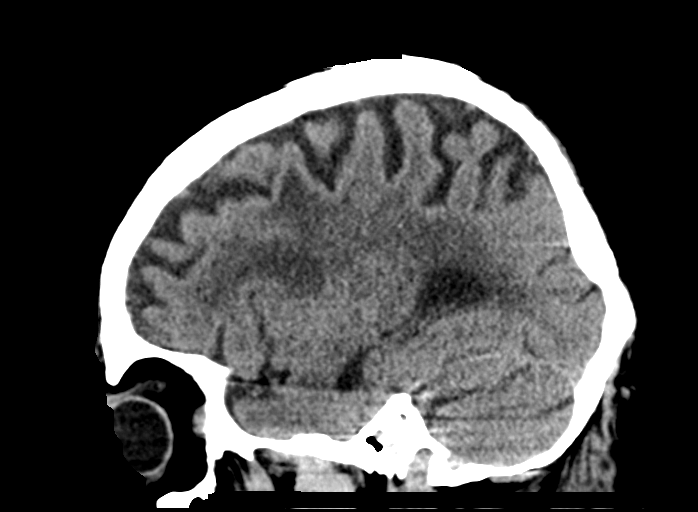

[15 of 47 positions shown; findings below may reference images not displayed]

FINDINGS: Brain: There is mild diffuse atrophy which is unchanged. No evidence
of acute infarction, hemorrhage, hydrocephalus, extra-axial
collection or mass lesion/mass effect. There is moderate patchy
periventricular and deep white matter hypodensity which likely
represents chronic small vessel ischemic change. There is a small
old cortical infarct in the right frontal lobe, unchanged. There are
old lacunar infarcts in the right basal ganglia, also unchanged.
Gray-white matter distinction is otherwise preserved.

Vascular: No hyperdense vessel or unexpected calcification.

Skull: Normal. Negative for fracture or focal lesion.

Sinuses/Orbits: No acute finding.

Other: None.
IMPRESSION: 1. No acute intracranial abnormality.
2. Stable diffuse atrophy and chronic ischemic changes as described
above.

## 2020-10-21 NOTE — ED Notes (Addendum)
Pt feeding tube flushed easily and well, free flowing technique. Tube placement verified by  45ml of air.  Dressing applied to area.

## 2020-10-21 NOTE — ED Triage Notes (Signed)
Pt stated he fell out of bed and hit his back. Was here earlier for clogged feeding tube. Pt alert, oriented, skin warm and dry. Vitals within normal limits.

## 2020-10-21 NOTE — Discharge Instructions (Addendum)
YOur tube is working now.  Return if any problems.

## 2020-10-21 NOTE — ED Notes (Signed)
Got pt a soda ok per nurse and dialed his mom number for him to talk to her

## 2020-10-21 NOTE — ED Provider Notes (Signed)
Emergency Department Provider Note   I have reviewed the triage vital signs and the nursing notes.   HISTORY  Chief Complaint Fall (Fall)   HPI EQUAN COGBILL is a 55 y.o. male with past medical history reviewed below returns to the emergency department after reportedly falling from his bed.  He was seen earlier in the ED with concern for G-tube not flushing properly.  This was addressed and patient was discharged back to his facility.  He states he was leaning from the bed when he fell to the floor.  He tells me he did strike his head on the floor.  Denies loss of consciousness.  He also tells me that staff at the facility told him the G-tube is not flushing and requesting additional evaluation.   Past Medical History:  Diagnosis Date   Aplastic anemia, unspecified (HCC)    Chest pain, unspecified    Chronic airway obstruction, not elsewhere classified    Cocaine substance abuse (HCC)    Neuropathy    Other and unspecified hyperlipidemia    Shortness of breath    Stroke (HCC) 06/17/2020   Tobacco use disorder    Type II or unspecified type diabetes mellitus without mention of complication, not stated as uncontrolled    Unspecified epilepsy without mention of intractable epilepsy    Unspecified essential hypertension     Patient Active Problem List   Diagnosis Date Noted   Dysphagia, post-stroke    Adjustment disorder with mixed anxiety and depressed mood    Acute blood loss anemia 10/02/2020   PEG (percutaneous endoscopic gastrostomy) status (HCC)    Malnutrition of moderate degree 09/30/2020   S/P percutaneous endoscopic gastrostomy (PEG) tube placement (HCC)    Labile blood glucose    Leukocytosis    Goals of care, counseling/discussion 09/12/2020   Acute stroke due to ischemia (HCC) 09/11/2020   Acute respiratory failure with hypoxia (HCC) 09/11/2020   Aspiration pneumonitis (HCC) 09/11/2020   Dysarthria 09/11/2020   Sepsis due to undetermined organism (HCC)  09/11/2020   Cocaine abuse (HCC)    Acute CVA (cerebrovascular accident) (HCC) 07/25/2020   Acute on chronic respiratory failure with hypoxia (HCC) 11/04/2019   Rhinovirus infection 11/04/2019   Essential hypertension 11/04/2019   CAP (community acquired pneumonia) 11/03/2019   Diabetic polyneuropathy associated with diabetes mellitus due to underlying condition (HCC) 02/06/2019   Localization-related (focal) (partial) symptomatic epilepsy and epileptic syndromes with complex partial seizures, intractable, without status epilepticus (HCC) 02/06/2019   Mixed diabetic hyperlipidemia associated with type 2 diabetes mellitus (HCC) 11/26/2018   Cough 11/03/2018   Obstructive sleep apnea 11/03/2018   Dysphagia-----s/p Prior Uvulectomy and Now with Acute CVA 09/27/2018   Eczema 09/27/2018   Right leg weakness 01/19/2018   History of arthroscopy of knee 11/15/2017   Complex tear of lateral meniscus of right knee as current injury 10/27/2017   Effusion of right knee 07/26/2017   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   Cellulitis 07/22/2017   Primary osteoarthritis involving multiple joints 06/22/2017   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 02/01/2017   Right knee pain 02/01/2017   Acute bronchitis 12/07/2016   Acute idiopathic gout of left foot 12/07/2016   Anxiety, generalized 12/07/2016   COPD with acute exacerbation (HCC) 12/07/2016   Seizures (HCC) 12/07/2016   CAD (coronary artery disease) 12/19/2010   Uncontrolled type 2 diabetes mellitus, with Nirel Babler-term current use of insulin (HCC) 12/19/2010   Hypertension associated with diabetes (HCC) 12/19/2010  GERD (gastroesophageal reflux disease) 12/19/2010   Hypertriglyceridemia 12/19/2010   Tobacco abuse 12/19/2010    Past Surgical History:  Procedure Laterality Date   BIOPSY  10/02/2020   Procedure: BIOPSY;  Surgeon: Jenel Lucks, MD;  Location: [facility] ENDOSCOPY;  Service: Gastroenterology;;    CARDIAC CATHETERIZATION  10/02/2010    Nonobstructive mild coronary plaque   ESOPHAGOGASTRODUODENOSCOPY N/A 10/02/2020   Procedure: ESOPHAGOGASTRODUODENOSCOPY (EGD);  Surgeon: Jenel Lucks, MD;  Location: [facility] ENDOSCOPY;  Service: Gastroenterology;  Laterality: N/A;   IR GASTROSTOMY TUBE MOD SED  09/24/2020   KNEE SURGERY Bilateral 2018   Repair   PERFORATED VISCUS SURGERY     MULTIPLE FRACTURES    Allergies Patient has no known allergies.  Family History  Problem Relation Age of Onset   Heart attack Father    COPD Father    Coronary artery disease Mother     Social History Social History   Tobacco Use   Smoking status: Every Day    Packs/day: 1.00    Years: 30.00    Pack years: 30.00    Types: Cigarettes   Smokeless tobacco: Never  Substance Use Topics   Alcohol use: Yes    Comment: drinks beer on the weekends   Drug use: Yes    Types: Cocaine    Review of Systems  Constitutional: No fever/chills Eyes: No visual changes. ENT: No sore throat. Cardiovascular: Denies chest pain. Respiratory: Denies shortness of breath. Gastrointestinal: No abdominal pain.  No nausea, no vomiting.  No diarrhea.  No constipation. Genitourinary: Negative for dysuria. Musculoskeletal: Negative for back pain. Skin: Negative for rash. Neurological: Negative for focal weakness or numbness. Positive HA.   10-point ROS otherwise negative.  ____________________________________________   PHYSICAL EXAM:  VITAL SIGNS: ED Triage Vitals  Enc Vitals Group     BP 10/21/20 1853 132/90     Pulse Rate 10/21/20 1853 97     Resp 10/21/20 1900 16     Temp 10/21/20 1853 97.7 F (36.5 C)     Temp Source 10/21/20 1853 Axillary     SpO2 10/21/20 1852 95 %     Weight 10/21/20 1856 173 lb 1 oz (78.5 kg)     Height 10/21/20 1856 6' (1.829 m)   Constitutional: Alert and oriented. Well appearing and in no acute distress. Eyes: Conjunctivae are normal. PERRL.  Head: Atraumatic. Nose: No  congestion/rhinnorhea. Mouth/Throat: Mucous membranes are moist.  Neck: No stridor. No cervical spine tenderness to palpation. Cardiovascular: Normal rate, regular rhythm. Good peripheral circulation. Grossly normal heart sounds.   Respiratory: Normal respiratory effort.  No retractions. Lungs CTAB. Gastrointestinal: Soft and nontender. No distention.  Musculoskeletal: No gross deformities of extremities. Neurologic:  No gross focal neurologic deficits are appreciated.  Skin:  Skin is warm, dry and intact. No rash noted. ____________________________________________  RADIOLOGY  CT HEAD WO CONTRAST ( )  Result Date: 10/21/2020 CLINICAL DATA:  Head trauma. EXAM: CT HEAD WITHOUT CONTRAST TECHNIQUE: Contiguous axial images were obtained from the base of the skull through the vertex without intravenous contrast. COMPARISON:  CT head 10/19/2020. FINDINGS: Brain: There is mild diffuse atrophy which is unchanged. No evidence of acute infarction, hemorrhage, hydrocephalus, extra-axial collection or mass lesion/mass effect. There is moderate patchy periventricular and deep white matter hypodensity which likely represents chronic small vessel ischemic change. There is a small old cortical infarct in the right frontal lobe, unchanged. There are old lacunar infarcts in the right basal ganglia, also unchanged. Gray-white matter distinction is otherwise preserved.  Vascular: No hyperdense vessel or unexpected calcification. Skull: Normal. Negative for fracture or focal lesion. Sinuses/Orbits: No acute finding. Other: None. IMPRESSION: 1. No acute intracranial abnormality. 2. Stable diffuse atrophy and chronic ischemic changes as described above. Electronically Signed   By: Darliss Cheney M.D.   On: 10/21/2020 19:45   CT Cervical Spine Wo Contrast  Result Date: 10/21/2020 CLINICAL DATA:  Neck trauma, midline tenderness. : CT CERVICAL SPINE WITHOUT CONTRAST TECHNIQUE: Multidetector CT imaging of the cervical spine  was performed without intravenous contrast. Multiplanar CT image reconstructions were also generated. COMPARISON:  None. FINDINGS: Alignment: Examination is limited secondary to motion artifact. Alignment is grossly within normal limits. There is straightening of normal cervical lordosis which can be seen with muscle spasm or patient positioning. Skull base and vertebrae: Examination is limited secondary to motion. No gross fracture or primary bone lesion identified. Soft tissues and spinal canal: No prevertebral fluid or swelling. No visible canal hematoma. There are atherosclerotic calcifications of the carotid artery bifurcations. Disc levels:  Preserved. Upper chest: Negative. Other: Negative. IMPRESSION: 1. Examination is technically limited secondary to motion artifact. No definite acute fracture or dislocation of the cervical spine. Electronically Signed   By: Darliss Cheney M.D.   On: 10/21/2020 19:38    ____________________________________________   PROCEDURES  Procedure(s) performed:   Procedures  None  ____________________________________________   INITIAL IMPRESSION / ASSESSMENT AND PLAN / ED COURSE  Pertinent labs & imaging results that were available during my care of the patient were reviewed by me and considered in my medical decision making (see chart for details).   Patient presents to the emergency department after fall from bed after returning to his facility.  His G-tube was evaluated by nursing with me at the bedside.  The tube is freely flushing without pain or resistance.  I do not see evidence of tube malfunction/obstruction.  No indication for replacement in the emergency department.  Given patient's fall, CT imaging of the head and cervical spine obtained.  There is some motion artifact but no acute fracture or dislocation noted.  No bleeding.  Patient clear for return to his nursing facility.    ____________________________________________  FINAL CLINICAL  IMPRESSION(S) / ED DIAGNOSES  Final diagnoses:  Fall, initial encounter  Injury of head, initial encounter    Note:  This document was prepared using Dragon voice recognition software and may include unintentional dictation errors.  Alona Bene, MD, [facility] Emergency Medicine    Anaya Bovee, Arlyss Repress, MD 10/21/20 2241

## 2020-10-21 NOTE — Discharge Instructions (Addendum)
You were seen in the emergency room today after a fall.  Your G-tube flushes normally here at the bedside.  You may continue normal use/care of the G-tube.  The CT scans of your head and neck after the fall are normal.

## 2020-10-21 NOTE — ED Provider Notes (Signed)
[name] EMERGENCY DEPARTMENT Provider Note   CSN: [id number] Arrival date & time: 10/21/20  0174     History Chief Complaint  Patient presents with   GI Problem    Brent [name] is a 55 y.o. male.  ko  The history is provided by the patient. No language interpreter was used.  GI Problem This is a new problem. The problem occurs constantly. The problem has been gradually worsening. Nothing aggravates the symptoms. Nothing relieves the symptoms. He has tried nothing for the symptoms. The treatment provided no relief.  Pt complains that his feeding tube was not working this am.  Pt complains of being thirsty     Past Medical History:  Diagnosis Date   Aplastic anemia, unspecified (HCC)    Chest pain, unspecified    Chronic airway obstruction, not elsewhere classified    Cocaine substance abuse (HCC)    Neuropathy    Other and unspecified hyperlipidemia    Shortness of breath    Stroke (HCC) 06/17/2020   Tobacco use disorder    Type II or unspecified type diabetes mellitus without mention of complication, not stated as uncontrolled    Unspecified epilepsy without mention of intractable epilepsy    Unspecified essential hypertension     Patient Active Problem List   Diagnosis Date Noted   Dysphagia, post-stroke    Adjustment disorder with mixed anxiety and depressed mood    Acute blood loss anemia 10/02/2020   PEG (percutaneous endoscopic gastrostomy) status (HCC)    Malnutrition of moderate degree 09/30/2020   S/P percutaneous endoscopic gastrostomy (PEG) tube placement (HCC)    Labile blood glucose    Leukocytosis    Goals of care, counseling/discussion 09/12/2020   Acute stroke due to ischemia (HCC) 09/11/2020   Acute respiratory failure with hypoxia (HCC) 09/11/2020   Aspiration pneumonitis (HCC) 09/11/2020   Dysarthria 09/11/2020   Sepsis due to undetermined organism (HCC) 09/11/2020   Cocaine abuse (HCC)    Acute CVA (cerebrovascular accident) (HCC)  07/25/2020   Acute on chronic respiratory failure with hypoxia (HCC) 11/04/2019   Rhinovirus infection 11/04/2019   Essential hypertension 11/04/2019   CAP (community acquired pneumonia) 11/03/2019   Diabetic polyneuropathy associated with diabetes mellitus due to underlying condition (HCC) 02/06/2019   Localization-related (focal) (partial) symptomatic epilepsy and epileptic syndromes with complex partial seizures, intractable, without status epilepticus (HCC) 02/06/2019   Mixed diabetic hyperlipidemia associated with type 2 diabetes mellitus (HCC) 11/26/2018   Cough 11/03/2018   Obstructive sleep apnea 11/03/2018   Dysphagia-----s/p Prior Uvulectomy and Now with Acute CVA 09/27/2018   Eczema 09/27/2018   Right leg weakness 01/19/2018   History of arthroscopy of knee 11/15/2017   Complex tear of lateral meniscus of right knee as current injury 10/27/2017   Effusion of right knee 07/26/2017   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   Cellulitis 07/22/2017   Primary osteoarthritis involving multiple joints 06/22/2017   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 02/01/2017   Right knee pain 02/01/2017   Acute bronchitis 12/07/2016   Acute idiopathic gout of left foot 12/07/2016   Anxiety, generalized 12/07/2016   COPD with acute exacerbation (HCC) 12/07/2016   Seizures (HCC) 12/07/2016   CAD (coronary artery disease) 12/19/2010   Uncontrolled type 2 diabetes mellitus, with long-term current use of insulin (HCC) 12/19/2010   Hypertension associated with diabetes (HCC) 12/19/2010   GERD (gastroesophageal reflux disease) 12/19/2010   Hypertriglyceridemia 12/19/2010   Tobacco abuse 12/19/2010  Past Surgical History:  Procedure Laterality Date   BIOPSY  10/02/2020   Procedure: BIOPSY;  Surgeon: Jenel Lucks, MD;  Location: [facility] ENDOSCOPY;  Service: Gastroenterology;;   CARDIAC CATHETERIZATION  10/02/2010    Nonobstructive mild coronary plaque    ESOPHAGOGASTRODUODENOSCOPY N/A 10/02/2020   Procedure: ESOPHAGOGASTRODUODENOSCOPY (EGD);  Surgeon: Jenel Lucks, MD;  Location: [facility] ENDOSCOPY;  Service: Gastroenterology;  Laterality: N/A;   IR GASTROSTOMY TUBE MOD SED  09/24/2020   KNEE SURGERY Bilateral 2018   Repair   PERFORATED VISCUS SURGERY     MULTIPLE FRACTURES       Family History  Problem Relation Age of Onset   Heart attack Father    COPD Father    Coronary artery disease Mother     Social History   Tobacco Use   Smoking status: Every Day    Packs/day: 1.00    Years: 30.00    Pack years: 30.00    Types: Cigarettes   Smokeless tobacco: Never  Substance Use Topics   Alcohol use: Yes    Comment: drinks beer on the weekends   Drug use: Yes    Types: Cocaine    Home Medications Prior to Admission medications   Medication Sig Start Date End Date Taking? Authorizing Provider  acetaminophen (TYLENOL) 160 MG/5ML solution Place 20.3 mLs (650 mg total) into feeding tube every 4 (four) hours as needed for moderate pain or fever. 09/26/20   Pennie Banter, DO  albuterol (VENTOLIN HFA) 108 (90 Base) MCG/ACT inhaler Inhale 2 puffs into the lungs every 6 (six) hours as needed for wheezing or shortness of breath. 10/17/20   Angiulli, Mcarthur Rossetti, PA-C  ALPRAZolam Prudy Feeler) 0.25 MG tablet Place 1 tablet (0.25 mg total) into feeding tube 3 (three) times daily as needed for anxiety. 10/17/20   Angiulli, Mcarthur Rossetti, PA-C  carBAMazepine (TEGRETOL) 100 MG/5ML suspension Place 10 mLs (200 mg total) into feeding tube every 6 (six) hours. 09/26/20   Pennie Banter, DO  clopidogrel (PLAVIX) 75 MG tablet Place 1 tablet (75 mg total) into feeding tube daily. 09/27/20   Pennie Banter, DO  escitalopram (LEXAPRO) 10 MG tablet Place 1 tablet (10 mg total) into feeding tube daily. 10/18/20   Angiulli, Mcarthur Rossetti, PA-C  fenofibrate 160 MG tablet Take 160 mg by mouth daily. 12/11/16   [provider]  HYDROcodone-acetaminophen (NORCO/VICODIN)  5-325 MG tablet Place 1 tablet into feeding tube daily as needed for moderate pain. 10/17/20   Angiulli, Mcarthur Rossetti, PA-C  insulin detemir (LEVEMIR) 100 UNIT/ML injection Inject 0.06 mLs (6 Units total) into the skin 2 (two) times daily. 10/17/20   Angiulli, Mcarthur Rossetti, PA-C  lidocaine (LIDODERM) 5 % Place 1 patch onto the skin daily. Remove & Discard patch within 12 hours or as directed by MD 10/18/20   Angiulli, Mcarthur Rossetti, PA-C  nitroGLYCERIN (NITROSTAT) 0.4 MG SL tablet Place 1 tablet (0.4 mg total) under the tongue every 5 (five) minutes as needed for chest pain. 09/26/20   Pennie Banter, DO  Nutritional Supplements (FEEDING SUPPLEMENT, OSMOLITE 1.5 CAL,) LIQD Place 355 mLs into feeding tube 4 (four) times daily. 10/17/20   Angiulli, Mcarthur Rossetti, PA-C  pantoprazole sodium (PROTONIX) 40 mg PACK Place 20 mLs (40 mg total) into feeding tube 2 (two) times daily. 10/17/20   Angiulli, Mcarthur Rossetti, PA-C  polyethylene glycol (MIRALAX / GLYCOLAX) 17 g packet Place 17 g into feeding tube daily. 09/27/20   Pennie Banter, DO  propranolol Bevely Palmer)  10 MG tablet Place 1 tablet (10 mg total) into feeding tube 2 (two) times daily. 10/17/20   Angiulli, Mcarthur Rossettianiel J, PA-C  QUEtiapine (SEROQUEL) 100 MG tablet Place 1 tablet (100 mg total) into feeding tube at bedtime. 10/17/20   Angiulli, Mcarthur Rossettianiel J, PA-C  rosuvastatin (CRESTOR) 20 MG tablet Place 1 tablet (20 mg total) into feeding tube daily. 10/18/20   Angiulli, Mcarthur Rossettianiel J, PA-C  Water For Irrigation, Sterile (FREE WATER) SOLN Place 230 mLs into feeding tube 4 (four) times daily. 10/17/20   Angiulli, Mcarthur Rossettianiel J, PA-C    Allergies    Patient has no known allergies.  Review of Systems   Review of Systems  All other systems reviewed and are negative.  Physical Exam Updated Vital Signs BP 124/70   Pulse 71   Temp 97.7 F (36.5 C) (Oral)   Resp 20   Ht 6' (1.829 m)   Wt 78.5 kg   SpO2 97%   BMI 23.46 kg/m   Physical Exam Vitals and nursing note reviewed.  Constitutional:       Appearance: He is well-developed.  HENT:     Head: Normocephalic and atraumatic.  Eyes:     Conjunctiva/sclera: Conjunctivae normal.  Cardiovascular:     Rate and Rhythm: Normal rate and regular rhythm.     Heart sounds: No murmur heard. Pulmonary:     Effort: Pulmonary effort is normal. No respiratory distress.     Breath sounds: Normal breath sounds.  Abdominal:     Palpations: Abdomen is soft.     Tenderness: There is no abdominal tenderness.     Comments: feeding tube in place   Musculoskeletal:     Cervical back: Neck supple.  Skin:    General: Skin is warm and dry.  Neurological:     Mental Status: He is alert.    ED Results / Procedures / Treatments   Labs (all labs ordered are listed, but only abnormal results are displayed) Labs Reviewed - No data to display  EKG None  Radiology CT HEAD WO CONTRAST (5MM)  Result Date: 10/19/2020 CLINICAL DATA:  Fall with head trauma. EXAM: CT HEAD WITHOUT CONTRAST TECHNIQUE: Contiguous axial images were obtained from the base of the skull through the vertex without intravenous contrast. COMPARISON:  Head CT September 11, 2020 FINDINGS: Motion degraded examination. Brain: Age related global parenchymal volume loss with ex vacuo dilatation of the ventricular system. Similar severe burden of chronic ischemic small vessel white matter disease. Chronic lacunar type infarcts in the bilateral basal ganglia and thalami. Chronic right frontal infarction. No evidence of acute large vascular territory infarction, hemorrhage, hydrocephalus, extra-axial collection or mass lesion/mass effect. Vascular: No hyperdense vessel. Atherosclerotic calcifications of the internal carotid and vertebral arteries at the skull base. Skull: Normal. Negative for fracture or focal lesion. Sinuses/Orbits: Visualized portions of the paranasal sinuses and mastoid air cells are predominantly clear. Orbits are grossly unremarkable. Other: None IMPRESSION: 1. No definite acute  intracranial abnormality on this motion degraded examination. 2. Similar age related global parenchymal volume loss and severe chronic ischemic small vessel white matter disease. 3. Chronic lacunar type infarcts in the bilateral basal ganglia and thalami and chronic right frontal lobe infarcts. Electronically Signed   By: Maudry MayhewJeffrey  Waltz M.D.   On: 10/19/2020 15:48   DG Knee Complete 4 Views Left  Result Date: 10/19/2020 CLINICAL DATA:  Fall, slid out of bed.  Left hip and knee pain. EXAM: LEFT KNEE - COMPLETE 4+ VIEW COMPARISON:  None.  FINDINGS: No evidence of fracture, dislocation, or joint effusion. Mild degenerative change with peripheral spurring. Faint chondrocalcinosis. Minimal medial soft tissue edema. IMPRESSION: 1. Soft tissue edema without acute fracture or dislocation. 2. Mild osteoarthritis and chondrocalcinosis. Electronically Signed   By: Narda Rutherford M.D.   On: 10/19/2020 16:15   DG Hip Unilat W or Wo Pelvis 2-3 Views Left  Result Date: 10/19/2020 CLINICAL DATA:  Fall.  Slid out of bed.  Left hip pain. EXAM: DG HIP (WITH OR WITHOUT PELVIS) 2-3V LEFT COMPARISON:  None. FINDINGS: No acute fracture. Femoral head is well seated in the acetabulum. Mild acetabular spurring. Mild spurring of the pubic symphysis suggesting remote prior injury. No symphyseal or sacroiliac joint disruption. The bones are under mineralized. There is barium in the colon from recent barium swallow. IMPRESSION: No fracture of the pelvis or left hip. Electronically Signed   By: Narda Rutherford M.D.   On: 10/19/2020 16:20    Procedures Procedures   Medications Ordered in ED Medications - No data to display  ED Course  I have reviewed the triage vital signs and the nursing notes.  Pertinent labs & imaging results that were available during my care of the patient were reviewed by me and considered in my medical decision making (see chart for details).    MDM Rules/Calculators/A&P                            MDM:  Brent [name] is able to flush tube.   Final Clinical Impression(s) / ED Diagnoses Final diagnoses:  Gastrostomy tube dysfunction (HCC)    Rx / DC Orders ED Discharge Orders     None     An After Visit Summary was printed and given to the patient.    Elson Areas, Cordelia Poche 10/21/20 1010    Glendora Score, MD 10/21/20 [phone]

## 2020-10-21 NOTE — ED Notes (Signed)
Gtube irrigated. Noted small amount of food to be blocking opening at port. No pain with irrigation, flushes well.

## 2020-10-21 NOTE — ED Notes (Signed)
Mother updated on plan of care and verbalized agreement to discharge instructions

## 2020-10-21 NOTE — ED Notes (Signed)
Daughter here to transport pt back to [facility]

## 2020-10-21 NOTE — ED Triage Notes (Signed)
Patient sent to the ED via [facility] for clogged G-tube. Patient states staff did not try to feed him this morning but staff claims his g-tube is clogged. Patient alert & oriented x3. Patient presents via EMS. Hx of CVA with left sided weakness. Patient states g-tube worked last night for dinner.

## 2020-10-21 NOTE — ED Notes (Signed)
Pt incont of stool and urine, pt cleaned and linen changed.

## 2020-11-01 ENCOUNTER — Encounter (HOSPITAL_COMMUNITY): Payer: Self-pay | Admitting: Emergency Medicine

## 2020-11-01 ENCOUNTER — Other Ambulatory Visit: Payer: Self-pay

## 2020-11-01 ENCOUNTER — Emergency Department (HOSPITAL_COMMUNITY)
Admission: EM | Admit: 2020-11-01 | Discharge: 2020-11-02 | Disposition: A | Discharge status: Home / Self Care | Payer: Medicaid Other | Attending: Emergency Medicine | Admitting: Emergency Medicine

## 2020-11-01 DIAGNOSIS — R1013 Epigastric pain: Secondary | ICD-10-CM | POA: Insufficient documentation

## 2020-11-01 DIAGNOSIS — Z79899 Other long term (current) drug therapy: Secondary | ICD-10-CM | POA: Insufficient documentation

## 2020-11-01 DIAGNOSIS — I10 Essential (primary) hypertension: Secondary | ICD-10-CM | POA: Insufficient documentation

## 2020-11-01 DIAGNOSIS — F17210 Nicotine dependence, cigarettes, uncomplicated: Secondary | ICD-10-CM | POA: Insufficient documentation

## 2020-11-01 DIAGNOSIS — E1142 Type 2 diabetes mellitus with diabetic polyneuropathy: Secondary | ICD-10-CM | POA: Diagnosis not present

## 2020-11-01 DIAGNOSIS — I2510 Atherosclerotic heart disease of native coronary artery without angina pectoris: Secondary | ICD-10-CM | POA: Diagnosis not present

## 2020-11-01 DIAGNOSIS — K219 Gastro-esophageal reflux disease without esophagitis: Secondary | ICD-10-CM | POA: Insufficient documentation

## 2020-11-01 DIAGNOSIS — Z7984 Long term (current) use of oral hypoglycemic drugs: Secondary | ICD-10-CM | POA: Insufficient documentation

## 2020-11-01 DIAGNOSIS — Z794 Long term (current) use of insulin: Secondary | ICD-10-CM | POA: Insufficient documentation

## 2020-11-01 DIAGNOSIS — Z7902 Long term (current) use of antithrombotics/antiplatelets: Secondary | ICD-10-CM | POA: Diagnosis not present

## 2020-11-01 DIAGNOSIS — J441 Chronic obstructive pulmonary disease with (acute) exacerbation: Secondary | ICD-10-CM | POA: Diagnosis not present

## 2020-11-01 NOTE — ED Triage Notes (Signed)
Pt brought in [facility] from [place] for c/o abd pain around G-Tube site. Per staff at [facility] pt has been messing with his Gtube, hx of the same.

## 2020-11-02 ENCOUNTER — Emergency Department (HOSPITAL_COMMUNITY): Payer: Medicaid Other

## 2020-11-02 LAB — CBC WITH DIFFERENTIAL/PLATELET
Abs Immature Granulocytes: 0.05 10*3/uL (ref 0.00–0.07)
Basophils Absolute: 0.1 10*3/uL (ref 0.0–0.1)
Basophils Relative: 1 %
Eosinophils Absolute: 0.2 10*3/uL (ref 0.0–0.5)
Eosinophils Relative: 1 %
HCT: 34.4 % — ABNORMAL LOW (ref 39.0–52.0)
Hemoglobin: 11 g/dL — ABNORMAL LOW (ref 13.0–17.0)
Immature Granulocytes: 0 %
Lymphocytes Relative: 14 %
Lymphs Abs: 1.7 10*3/uL (ref 0.7–4.0)
MCH: 30.7 pg (ref 26.0–34.0)
MCHC: 32 g/dL (ref 30.0–36.0)
MCV: 96.1 fL (ref 80.0–100.0)
Monocytes Absolute: 0.6 10*3/uL (ref 0.1–1.0)
Monocytes Relative: 5 %
Neutro Abs: 9.5 10*3/uL — ABNORMAL HIGH (ref 1.7–7.7)
Neutrophils Relative %: 79 %
Platelets: 405 10*3/uL — ABNORMAL HIGH (ref 150–400)
RBC: 3.58 MIL/uL — ABNORMAL LOW (ref 4.22–5.81)
RDW: 13.2 % (ref 11.5–15.5)
WBC: 12.1 10*3/uL — ABNORMAL HIGH (ref 4.0–10.5)
nRBC: 0 % (ref 0.0–0.2)

## 2020-11-02 LAB — COMPREHENSIVE METABOLIC PANEL
ALT: 12 U/L (ref 0–44)
AST: 12 U/L — ABNORMAL LOW (ref 15–41)
Albumin: 3.4 g/dL — ABNORMAL LOW (ref 3.5–5.0)
Alkaline Phosphatase: 64 U/L (ref 38–126)
Anion gap: 8 (ref 5–15)
BUN: 19 mg/dL (ref 6–20)
CO2: 27 mmol/L (ref 22–32)
Calcium: 9.2 mg/dL (ref 8.9–10.3)
Chloride: 103 mmol/L (ref 98–111)
Creatinine, Ser: 0.8 mg/dL (ref 0.61–1.24)
GFR, Estimated: >60 mL/min (ref >60)
Glucose, Bld: 109 mg/dL — ABNORMAL HIGH (ref 70–99)
Potassium: 3.5 mmol/L (ref 3.5–5.1)
Sodium: 138 mmol/L (ref 135–145)
Total Bilirubin: 0.4 mg/dL (ref 0.3–1.2)
Total Protein: 7.2 g/dL (ref 6.5–8.1)

## 2020-11-02 LAB — LIPASE, BLOOD: Lipase: 23 U/L (ref 11–51)

## 2020-11-02 IMAGING — CT CT ABD-PELV W/ CM
2 of 5 series · 16 of 46 positions shown, 18 images · IV contrast (omnipaque)
Comparison: [DATE]

CLINICAL DATA: Abdominal abscess and infection suspected. Pain
around gastrostomy tube site for 1 day.

EXAM:
CT ABDOMEN AND PELVIS WITH CONTRAST
TECHNIQUE: Multidetector CT imaging of the abdomen and pelvis was performed
using the standard protocol following bolus administration of
intravenous contrast.
CONTRAST:  75mL OMNIPAQUE IOHEXOL 350 MG/ML SOLN

[Series 2: axial st · axial · 0.80mm/px · z∈[+430,+940]mm · 13 of 116 slices shown, 15 images]
[im 7/116  soft-tissue]
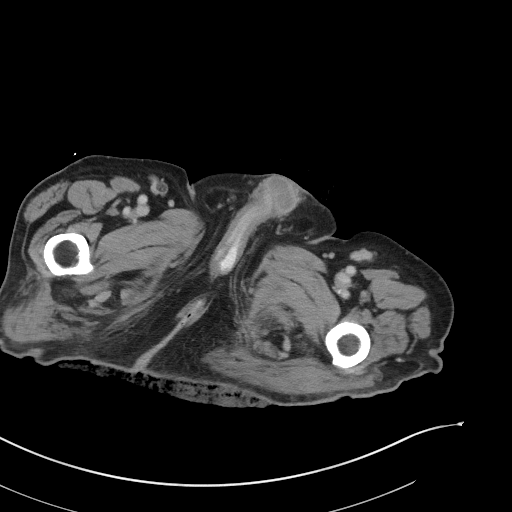
[im 7/116  bone]
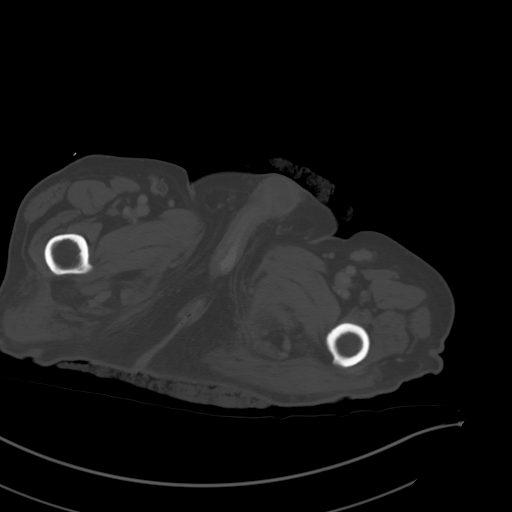
[im 14/116  soft-tissue]
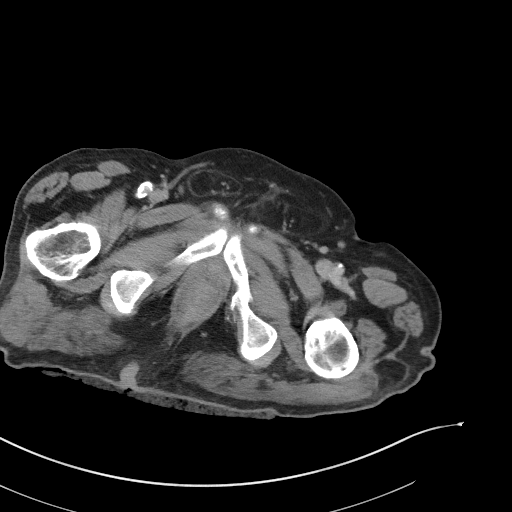
[im 28/116  soft-tissue]
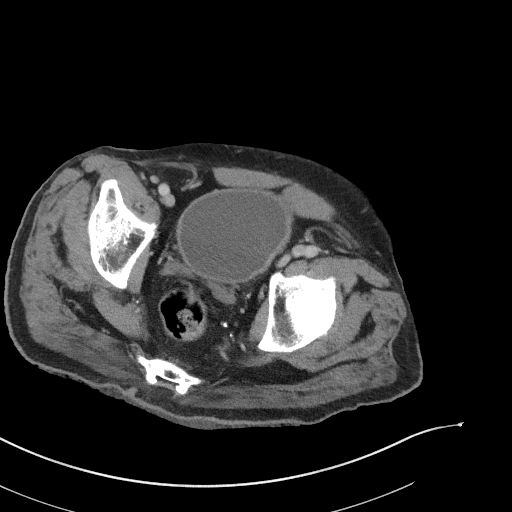
[im 34/116  soft-tissue]
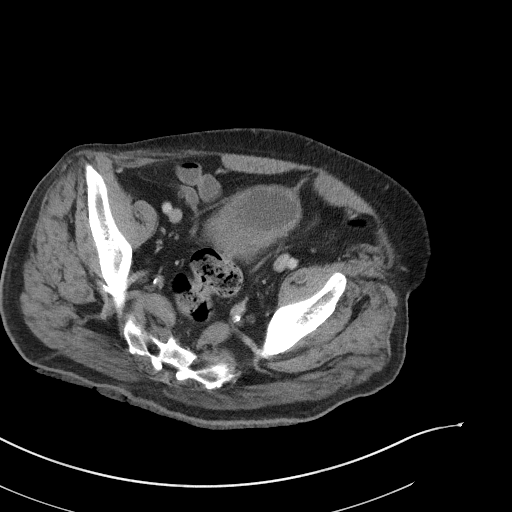
[im 41/116  soft-tissue]
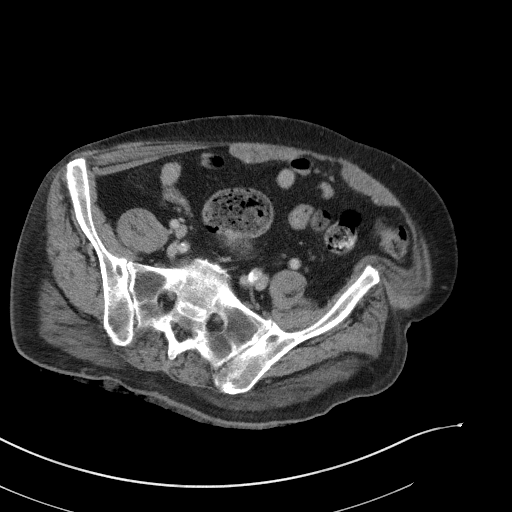
[im 48/116  soft-tissue]
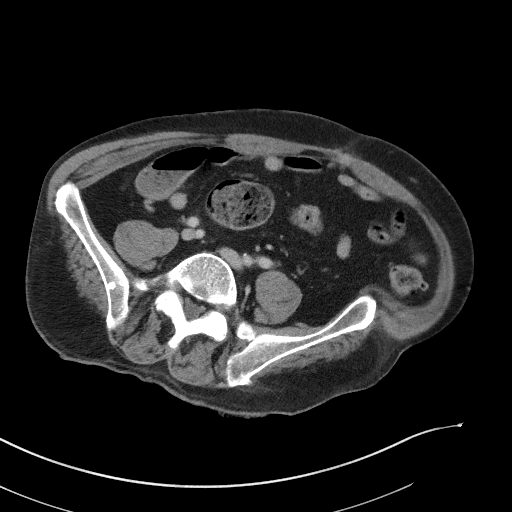
[im 61/116  soft-tissue]
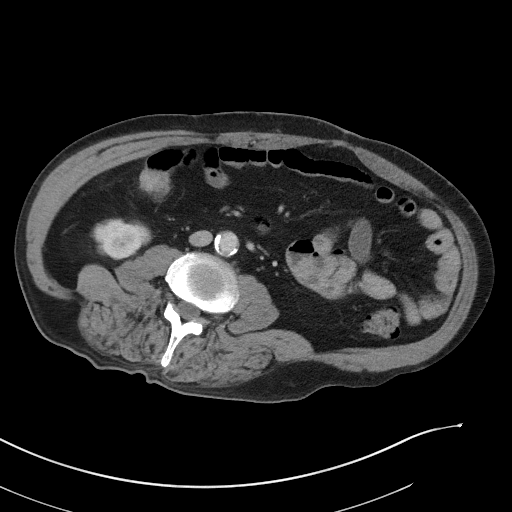
[im 68/116  soft-tissue]
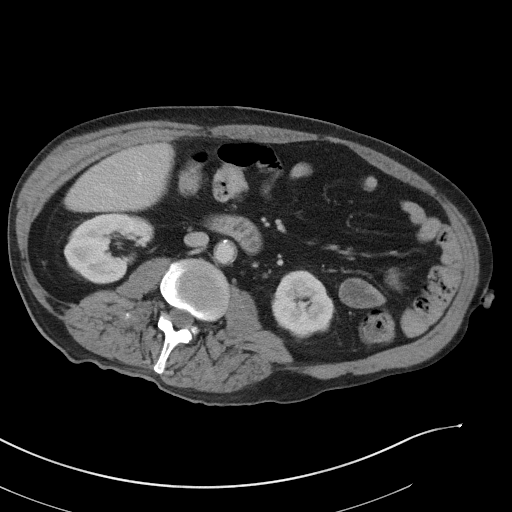
[im 75/116  soft-tissue]
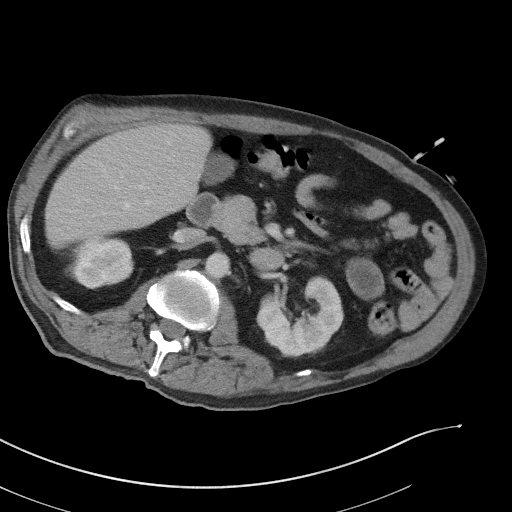
[im 75/116  bone]
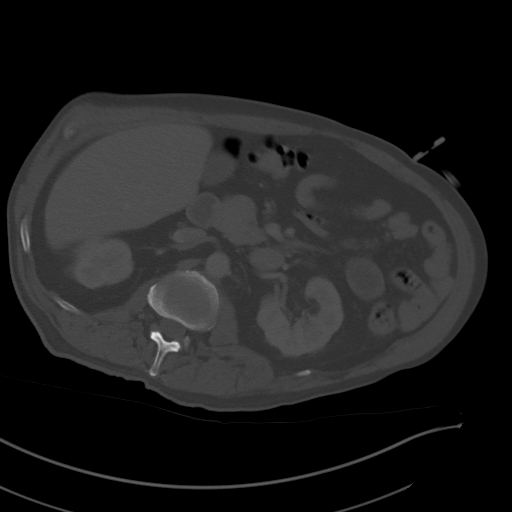
[im 82/116  soft-tissue]
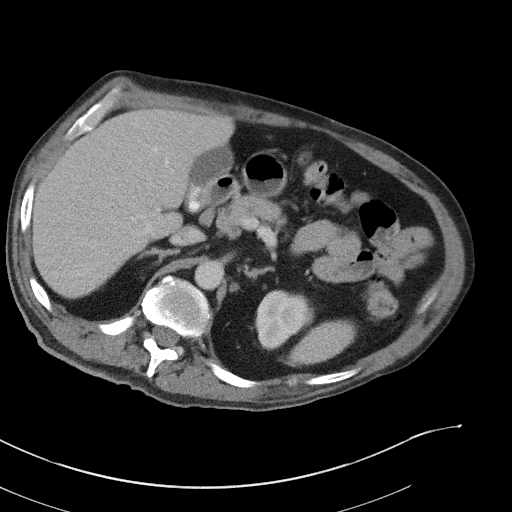
[im 88/116  soft-tissue]
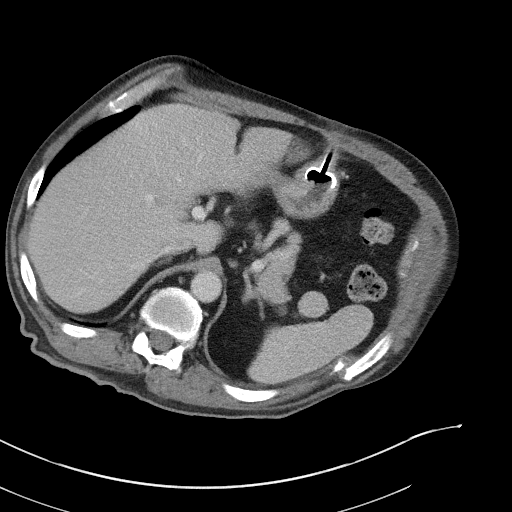
[im 102/116  soft-tissue]
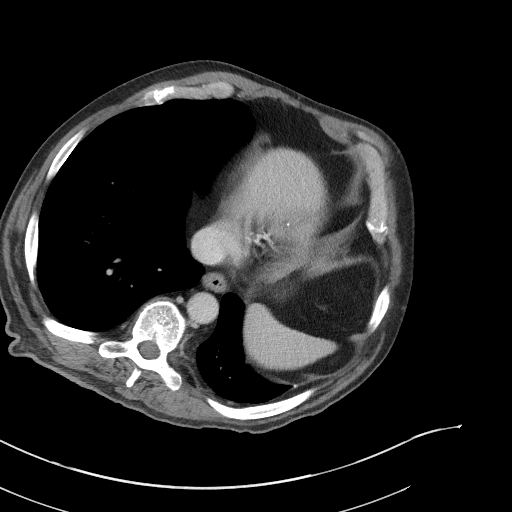
[im 109/116  soft-tissue]
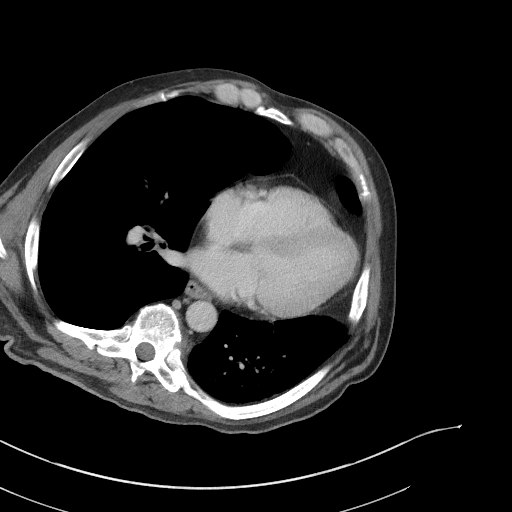

[Series 6: coronal st · coronal · 1.00mm/px · 3 of 117 slices shown]
[im 39/117  soft-tissue]
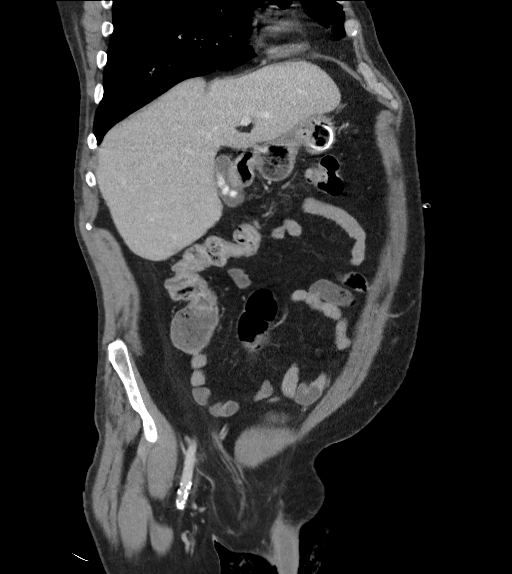
[im 52/117  soft-tissue]
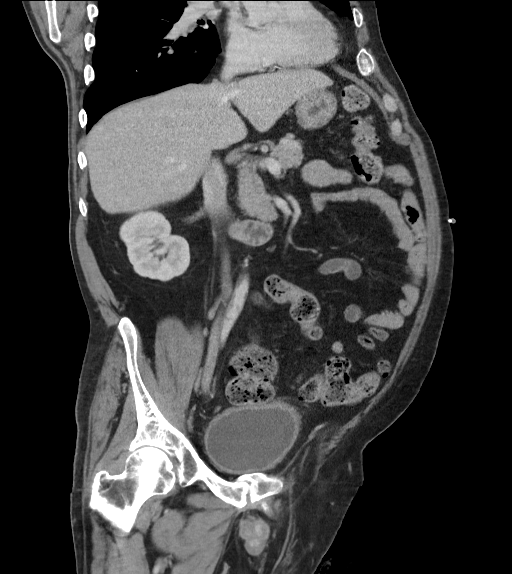
[im 65/117  soft-tissue]
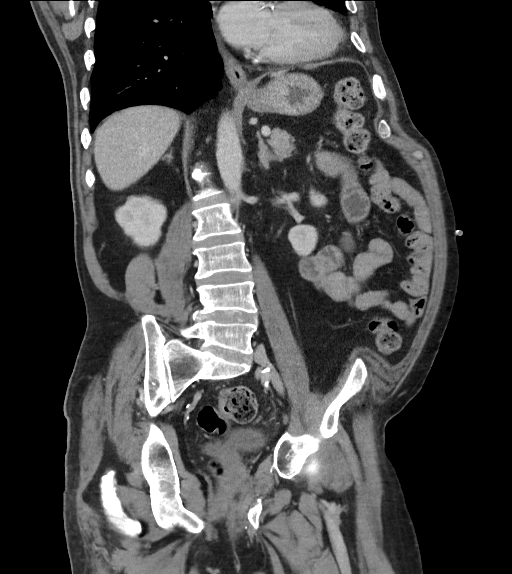

[16 of 46 positions shown; findings below may reference images not displayed]

FINDINGS: Lower chest: Consolidation and airspace disease in the left lung
base, likely pneumonia. This is progressing since the previous
study.

Hepatobiliary: No focal liver lesions. Multiple stones in the
gallbladder. No wall thickening or inflammatory changes.

Pancreas: Unremarkable. No pancreatic ductal dilatation or
surrounding inflammatory changes.

Spleen: Normal in size without focal abnormality.

Adrenals/Urinary Tract: No adrenal gland nodules. Kidneys are
symmetrical and homogeneous. No hydronephrosis or hydroureter. Mild
diffuse bladder wall thickening may indicate cystitis. Correlate
with urinalysis.

Stomach/Bowel: Gastrostomy tube inserted along the greater curvature
of the stomach inferiorly. No change in position since prior study.
Mild infiltration around the tube tract without evidence of
loculated collection or abscess. Similar appearance to previous
study. Stomach, small bowel, and colon are not abnormally distended.
Scattered stool throughout the colon. Appendix is normal.

Vascular/Lymphatic: Calcification of the aorta. No aneurysm. No
significant lymphadenopathy.

Reproductive: Prostate gland is not enlarged.

Other: No free air or free fluid in the abdomen. Fat in the inguinal
canals.

Musculoskeletal: Old fracture deformities of multiple ribs, lumbar
transverse processes, and left pubic rami. Curvilinear sclerosis in
the femoral heads, greater on the left, suggesting avascular
necrosis.
IMPRESSION: 1. Consolidation and airspace disease in the left lung base,
progressing since previous study, likely pneumonia superimposed on
chronic fibrosis.
2. Cholelithiasis without evidence of cholecystitis.
3. Gastrostomy tube appears to be in satisfactory position. Mild
infiltration around the tube tract without significant collection or
abscess formation.
4. Bladder wall thickening may indicate cystitis.
5. Aortic atherosclerosis.
6. Old fracture deformities.
7. Probable avascular necrosis of the femoral heads.

## 2020-11-02 MED ORDER — IOHEXOL 350 MG/ML SOLN
75.0000 mL | Freq: Once | INTRAVENOUS | Status: AC | PRN
  Administered 2020-11-02: 75 mL via INTRAVENOUS

## 2020-11-02 MED ORDER — ONDANSETRON HCL 4 MG/2ML IJ SOLN
4.0000 mg | Freq: Once | INTRAMUSCULAR | Status: AC
  Administered 2020-11-02: 4 mg via INTRAVENOUS
  Filled 2020-11-02: qty 2

## 2020-11-02 MED ORDER — HYDROMORPHONE HCL 1 MG/ML IJ SOLN
1.0000 mg | Freq: Once | INTRAMUSCULAR | Status: AC
  Administered 2020-11-02: 1 mg via INTRAVENOUS
  Filled 2020-11-02: qty 1

## 2020-11-02 NOTE — Discharge Instructions (Addendum)
Continue medications as previously prescribed.  Follow-up with your primary doctor for any new and/or concerning symptoms.

## 2020-11-02 NOTE — ED Notes (Signed)
Attempted report to [facility] x6

## 2020-11-02 NOTE — ED Notes (Signed)
Pt o2 dropping to 86% on RA, pt placed on 3L [place]. O2 now 93%

## 2020-11-02 NOTE — ED Notes (Signed)
Attempted report x4

## 2020-11-02 NOTE — ED Provider Notes (Signed)
[facility] EMERGENCY DEPARTMENT Provider Note   CSN: [id number] Arrival date & time: 11/01/20  2155     History Chief Complaint  Patient presents with   Abdominal Pain    Around G-Tube site    Brent [name] is a 55 y.o. male.  Patient is a 55 year old male with past medical history of type 2 diabetes, anemia, prior stroke.  Patient has an indwelling gastrostomy tube and is currently in a rehab facility.  Patient sent here complaining of pain at his G-tube site.  He describes severe pain in this area, but no vomiting or diarrhea.  The history is provided by the patient.  Abdominal Pain Pain location:  Epigastric Pain quality: cramping   Pain radiates to:  Does not radiate Pain severity:  Severe Onset quality:  Sudden Duration:  1 day Timing:  Constant Progression:  Worsening Chronicity:  Recurrent     Past Medical History:  Diagnosis Date   Aplastic anemia, unspecified (HCC)    Chest pain, unspecified    Chronic airway obstruction, not elsewhere classified    Cocaine substance abuse (HCC)    Neuropathy    Other and unspecified hyperlipidemia    Shortness of breath    Stroke (HCC) 06/17/2020   Tobacco use disorder    Type II or unspecified type diabetes mellitus without mention of complication, not stated as uncontrolled    Unspecified epilepsy without mention of intractable epilepsy    Unspecified essential hypertension     Patient Active Problem List   Diagnosis Date Noted   Dysphagia, post-stroke    Adjustment disorder with mixed anxiety and depressed mood    Acute blood loss anemia 10/02/2020   PEG (percutaneous endoscopic gastrostomy) status (HCC)    Malnutrition of moderate degree 09/30/2020   S/P percutaneous endoscopic gastrostomy (PEG) tube placement (HCC)    Labile blood glucose    Leukocytosis    Goals of care, counseling/discussion 09/12/2020   Acute stroke due to ischemia (HCC) 09/11/2020   Acute respiratory failure with hypoxia (HCC)  09/11/2020   Aspiration pneumonitis (HCC) 09/11/2020   Dysarthria 09/11/2020   Sepsis due to undetermined organism (HCC) 09/11/2020   Cocaine abuse (HCC)    Acute CVA (cerebrovascular accident) (HCC) 07/25/2020   Acute on chronic respiratory failure with hypoxia (HCC) 11/04/2019   Rhinovirus infection 11/04/2019   Essential hypertension 11/04/2019   CAP (community acquired pneumonia) 11/03/2019   Diabetic polyneuropathy associated with diabetes mellitus due to underlying condition (HCC) 02/06/2019   Localization-related (focal) (partial) symptomatic epilepsy and epileptic syndromes with complex partial seizures, intractable, without status epilepticus (HCC) 02/06/2019   Mixed diabetic hyperlipidemia associated with type 2 diabetes mellitus (HCC) 11/26/2018   Cough 11/03/2018   Obstructive sleep apnea 11/03/2018   Dysphagia-----s/p Prior Uvulectomy and Now with Acute CVA 09/27/2018   Eczema 09/27/2018   Right leg weakness 01/19/2018   History of arthroscopy of knee 11/15/2017   Complex tear of lateral meniscus of right knee as current injury 10/27/2017   Effusion of right knee 07/26/2017   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   Cellulitis 07/22/2017   Primary osteoarthritis involving multiple joints 06/22/2017   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 02/01/2017   Right knee pain 02/01/2017   Acute bronchitis 12/07/2016   Acute idiopathic gout of left foot 12/07/2016   Anxiety, generalized 12/07/2016   COPD with acute exacerbation (HCC) 12/07/2016   Seizures (HCC) 12/07/2016   CAD (coronary artery disease) 12/19/2010   Uncontrolled  type 2 diabetes mellitus, with long-term current use of insulin (HCC) 12/19/2010   Hypertension associated with diabetes (HCC) 12/19/2010   GERD (gastroesophageal reflux disease) 12/19/2010   Hypertriglyceridemia 12/19/2010   Tobacco abuse 12/19/2010    Past Surgical History:  Procedure Laterality Date   BIOPSY   10/02/2020   Procedure: BIOPSY;  Surgeon: Jenel Lucks, MD;  Location: [facility] ENDOSCOPY;  Service: Gastroenterology;;   CARDIAC CATHETERIZATION  10/02/2010    Nonobstructive mild coronary plaque   ESOPHAGOGASTRODUODENOSCOPY N/A 10/02/2020   Procedure: ESOPHAGOGASTRODUODENOSCOPY (EGD);  Surgeon: Jenel Lucks, MD;  Location: [facility] ENDOSCOPY;  Service: Gastroenterology;  Laterality: N/A;   IR GASTROSTOMY TUBE MOD SED  09/24/2020   KNEE SURGERY Bilateral 2018   Repair   PERFORATED VISCUS SURGERY     MULTIPLE FRACTURES       Family History  Problem Relation Age of Onset   Heart attack Father    COPD Father    Coronary artery disease Mother     Social History   Tobacco Use   Smoking status: Every Day    Packs/day: 1.00    Years: 30.00    Pack years: 30.00    Types: Cigarettes   Smokeless tobacco: Never  Substance Use Topics   Alcohol use: Yes    Comment: drinks beer on the weekends   Drug use: Yes    Types: Cocaine    Home Medications Prior to Admission medications   Medication Sig Start Date End Date Taking? Authorizing Provider  acetaminophen (TYLENOL) 160 MG/5ML solution Place 20.3 mLs (650 mg total) into feeding tube every 4 (four) hours as needed for moderate pain or fever. 09/26/20   Pennie Banter, DO  albuterol (VENTOLIN HFA) 108 (90 Base) MCG/ACT inhaler Inhale 2 puffs into the lungs every 6 (six) hours as needed for wheezing or shortness of breath. 10/17/20   Angiulli, Mcarthur Rossetti, PA-C  ALPRAZolam Prudy Feeler) 0.25 MG tablet Place 1 tablet (0.25 mg total) into feeding tube 3 (three) times daily as needed for anxiety. 10/17/20   Angiulli, Mcarthur Rossetti, PA-C  benazepril (LOTENSIN) 20 MG tablet Take 20 mg by mouth daily. 10/03/20   [provider]  carBAMazepine (TEGRETOL) 100 MG/5ML suspension Place 10 mLs (200 mg total) into feeding tube every 6 (six) hours. 09/26/20   Pennie Banter, DO  clopidogrel (PLAVIX) 75 MG tablet Place 1 tablet (75 mg total) into feeding tube  daily. 09/27/20   Pennie Banter, DO  escitalopram (LEXAPRO) 10 MG tablet Place 1 tablet (10 mg total) into feeding tube daily. 10/18/20   Angiulli, Mcarthur Rossetti, PA-C  fenofibrate (TRICOR) 48 MG tablet Take 48 mg by mouth daily. 10/03/20   [provider]  fenofibrate 160 MG tablet Take 160 mg by mouth daily. 12/11/16   [provider]  HYDROcodone-acetaminophen (NORCO/VICODIN) 5-325 MG tablet Place 1 tablet into feeding tube daily as needed for moderate pain. 10/17/20   Angiulli, Mcarthur Rossetti, PA-C  insulin detemir (LEVEMIR) 100 UNIT/ML injection Inject 0.06 mLs (6 Units total) into the skin 2 (two) times daily. 10/17/20   Angiulli, Mcarthur Rossetti, PA-C  insulin glargine (LANTUS) 100 UNIT/ML injection Inject 6 Units into the skin 2 (two) times daily. For DM    [provider]  JANUMET 50-1000 MG tablet Take 1 tablet by mouth 2 (two) times daily. 10/03/20   [provider]  lidocaine (LIDODERM) 5 % Place 1 patch onto the skin daily. Remove & Discard patch within 12 hours or as directed  by MD 10/18/20   Charlton Amor, PA-C  metFORMIN (GLUCOPHAGE) 1000 MG tablet Take 1,000 mg by mouth 2 (two) times daily. 10/03/20   [provider]  nitroGLYCERIN (NITROSTAT) 0.4 MG SL tablet Place 1 tablet (0.4 mg total) under the tongue every 5 (five) minutes as needed for chest pain. 09/26/20   Pennie Banter, DO  Nutritional Supplements (FEEDING SUPPLEMENT, OSMOLITE 1.5 CAL,) LIQD Place 355 mLs into feeding tube 4 (four) times daily. 10/17/20   Angiulli, Mcarthur Rossetti, PA-C  omeprazole (PRILOSEC) 20 MG capsule 20 mg daily.    [provider]  pantoprazole sodium (PROTONIX) 40 mg PACK Place 20 mLs (40 mg total) into feeding tube 2 (two) times daily. 10/17/20   Angiulli, Mcarthur Rossetti, PA-C  polyethylene glycol (MIRALAX / GLYCOLAX) 17 g packet Place 17 g into feeding tube daily. 09/27/20   Pennie Banter, DO  propranolol (INDERAL) 10 MG tablet Place 1 tablet (10 mg total) into feeding tube 2  (two) times daily. 10/17/20   Angiulli, Mcarthur Rossetti, PA-C  QUEtiapine (SEROQUEL) 100 MG tablet Place 1 tablet (100 mg total) into feeding tube at bedtime. 10/17/20   Angiulli, Mcarthur Rossetti, PA-C  rosuvastatin (CRESTOR) 20 MG tablet Place 1 tablet (20 mg total) into feeding tube daily. 10/18/20   Angiulli, Mcarthur Rossetti, PA-C  Water For Irrigation, Sterile (FREE WATER) SOLN Place 230 mLs into feeding tube 4 (four) times daily. 10/17/20   Angiulli, Mcarthur Rossetti, PA-C    Allergies    Patient has no known allergies.  Review of Systems   Review of Systems  Gastrointestinal:  Positive for abdominal pain.  All other systems reviewed and are negative.  Physical Exam Updated Vital Signs BP 139/70 (BP Location: Right Arm)   Pulse 72   Temp 98.1 F (36.7 C) (Oral)   Resp 16   Ht 6' (1.829 m)   Wt 78.5 kg   SpO2 93%   BMI 23.47 kg/m   Physical Exam Vitals and nursing note reviewed.  Constitutional:      Comments: Patient is a 55 year old male who appears older than stated age.  HENT:     Head: Normocephalic and atraumatic.  Cardiovascular:     Rate and Rhythm: Normal rate and regular rhythm.     Heart sounds: No murmur heard.   No friction rub.  Pulmonary:     Effort: Pulmonary effort is normal. No respiratory distress.     Breath sounds: Normal breath sounds. No wheezing or rales.  Abdominal:     General: Bowel sounds are normal. There is no distension.     Palpations: Abdomen is soft.     Tenderness: There is abdominal tenderness. There is no guarding or rebound.     Comments: There is tenderness to palpation in the epigastric region in the area of his gastrostomy tube.  The skin around the tube appears without erythema or drainage.  Musculoskeletal:        General: Normal range of motion.     Cervical back: Normal range of motion and neck supple.  Skin:    General: Skin is warm and dry.  Neurological:     Mental Status: He is alert and oriented to person, place, and time.     Coordination:  Coordination normal.    ED Results / Procedures / Treatments   Labs (all labs ordered are listed, but only abnormal results are displayed) Labs Reviewed - No data to display  EKG None  Radiology No results  found.  Procedures Procedures   Medications Ordered in ED Medications  ondansetron (ZOFRAN) injection 4 mg (has no administration in time range)  HYDROmorphone (DILAUDID) injection 1 mg (has no administration in time range)    ED Course  I have reviewed the triage vital signs and the nursing notes.  Pertinent labs & imaging results that were available during my care of the patient were reviewed by me and considered in my medical decision making (see chart for details).    MDM Rules/Calculators/A&P  Patient sent here from his extended care facility for evaluation of pain around his G-tube site.  Patient is concerned about infection.  Upon visual inspection, I see no drainage or surrounding erythema.  He is afebrile with white count of 12.    Laboratory studies are otherwise reassuring and CT scan shows no evidence for abscess at the G-tube site.  There was no mention of possible infiltrate in the base of his lung, however this does not fit the clinical picture.  He is not describing any cough or fever.  Final Clinical Impression(s) / ED Diagnoses Final diagnoses:  None    Rx / DC Orders ED Discharge Orders     None        Geoffery Lyons, MD 11/02/20 [phone]

## 2020-11-04 ENCOUNTER — Encounter (HOSPITAL_COMMUNITY): Payer: Self-pay | Admitting: Emergency Medicine

## 2020-11-04 ENCOUNTER — Other Ambulatory Visit: Payer: Self-pay

## 2020-11-04 ENCOUNTER — Emergency Department (HOSPITAL_COMMUNITY)
Admission: EM | Admit: 2020-11-04 | Discharge: 2020-11-04 | Disposition: A | Discharge status: Home / Self Care | Payer: Medicaid Other | Attending: Emergency Medicine | Admitting: Emergency Medicine

## 2020-11-04 DIAGNOSIS — F17210 Nicotine dependence, cigarettes, uncomplicated: Secondary | ICD-10-CM | POA: Insufficient documentation

## 2020-11-04 DIAGNOSIS — Z431 Encounter for attention to gastrostomy: Secondary | ICD-10-CM | POA: Insufficient documentation

## 2020-11-04 DIAGNOSIS — Z794 Long term (current) use of insulin: Secondary | ICD-10-CM | POA: Insufficient documentation

## 2020-11-04 DIAGNOSIS — Z931 Gastrostomy status: Secondary | ICD-10-CM

## 2020-11-04 DIAGNOSIS — E1140 Type 2 diabetes mellitus with diabetic neuropathy, unspecified: Secondary | ICD-10-CM | POA: Insufficient documentation

## 2020-11-04 DIAGNOSIS — Z7902 Long term (current) use of antithrombotics/antiplatelets: Secondary | ICD-10-CM | POA: Insufficient documentation

## 2020-11-04 DIAGNOSIS — Z7984 Long term (current) use of oral hypoglycemic drugs: Secondary | ICD-10-CM | POA: Diagnosis not present

## 2020-11-04 MED ORDER — OSMOLITE 1.5 CAL PO LIQD
355.0000 mL | Freq: Once | ORAL | Status: AC
  Administered 2020-11-04: 355 mL
  Filled 2020-11-04: qty 474

## 2020-11-04 NOTE — ED Provider Notes (Signed)
[facility] EMERGENCY DEPARTMENT Provider Note  CSN: [id number] Arrival date & time: 11/04/20 1017    History Chief Complaint  Patient presents with   Abdominal Pain    Brent [name] is a 54 y.o. male with history of stroke, PEG tube living at SNF reports he was concerned about some blood at the PEG site this morning. He was seen in the ED for pain there 2 days ago, concerned for abscess and had a CT which was neg for abscess. He is mostly concerned now about being hungry and needing a tube feed.    Past Medical History:  Diagnosis Date   Aplastic anemia, unspecified (HCC)    Chest pain, unspecified    Chronic airway obstruction, not elsewhere classified    Cocaine substance abuse (HCC)    Neuropathy    Other and unspecified hyperlipidemia    Shortness of breath    Stroke (HCC) 06/17/2020   Tobacco use disorder    Type II or unspecified type diabetes mellitus without mention of complication, not stated as uncontrolled    Unspecified epilepsy without mention of intractable epilepsy    Unspecified essential hypertension     Past Surgical History:  Procedure Laterality Date   BIOPSY  10/02/2020   Procedure: BIOPSY;  Surgeon: Jenel Lucks, MD;  Location: [facility] ENDOSCOPY;  Service: Gastroenterology;;   CARDIAC CATHETERIZATION  10/02/2010    Nonobstructive mild coronary plaque   ESOPHAGOGASTRODUODENOSCOPY N/A 10/02/2020   Procedure: ESOPHAGOGASTRODUODENOSCOPY (EGD);  Surgeon: Jenel Lucks, MD;  Location: [facility] ENDOSCOPY;  Service: Gastroenterology;  Laterality: N/A;   IR GASTROSTOMY TUBE MOD SED  09/24/2020   KNEE SURGERY Bilateral 2018   Repair   PERFORATED VISCUS SURGERY     MULTIPLE FRACTURES    Family History  Problem Relation Age of Onset   Heart attack Father    COPD Father    Coronary artery disease Mother     Social History   Tobacco Use   Smoking status: Every Day    Packs/day: 1.00    Years: 30.00    Pack years: 30.00    Types: Cigarettes    Smokeless tobacco: Never  Vaping Use   Vaping Use: Never used  Substance Use Topics   Alcohol use: Yes    Comment: drinks beer on the weekends   Drug use: Yes    Types: Cocaine     Home Medications Prior to Admission medications   Medication Sig Start Date End Date Taking? Authorizing Provider  albuterol (VENTOLIN HFA) 108 (90 Base) MCG/ACT inhaler Inhale 2 puffs into the lungs every 6 (six) hours as needed for wheezing or shortness of breath. 10/17/20  Yes Angiulli, Mcarthur Rossetti, PA-C  ALPRAZolam Prudy Feeler) 0.25 MG tablet Place 1 tablet (0.25 mg total) into feeding tube 3 (three) times daily as needed for anxiety. Patient taking differently: Place 0.5 mg into feeding tube 3 (three) times daily as needed for anxiety. 10/17/20  Yes Angiulli, Mcarthur Rossetti, PA-C  carBAMazepine (TEGRETOL) 100 MG/5ML suspension Place 10 mLs (200 mg total) into feeding tube every 6 (six) hours. 09/26/20  Yes Esaw Grandchild A, DO  clopidogrel (PLAVIX) 75 MG tablet Place 1 tablet (75 mg total) into feeding tube daily. 09/27/20  Yes Esaw Grandchild A, DO  Colchicine 0.6 MG CAPS 1 capsule by Feeding Tube route in the morning and at bedtime. 10/26/20  Yes [provider]  escitalopram (LEXAPRO) 10 MG tablet Place 1 tablet (10 mg total) into feeding tube daily. 10/18/20  Yes Angiulli, Mcarthur Rossetti, PA-C  fenofibrate (TRICOR) 48 MG tablet Take 48 mg by mouth daily. 10/03/20  Yes [provider]  HYDROcodone-acetaminophen (NORCO/VICODIN) 5-325 MG tablet Place 1 tablet into feeding tube daily as needed for moderate pain. 10/17/20  Yes Angiulli, Mcarthur Rossetti, PA-C  insulin glargine (LANTUS) 100 UNIT/ML injection Inject 6 Units into the skin 2 (two) times daily. For DM   Yes [provider]  lidocaine (LIDODERM) 5 % Place 1 patch onto the skin daily. Remove & Discard patch within 12 hours or as directed by MD 10/18/20  Yes Angiulli, Mcarthur Rossetti, PA-C  omeprazole (PRILOSEC) 20 MG capsule 20 mg daily.   Yes [provider]   polyethylene glycol (MIRALAX / GLYCOLAX) 17 g packet Place 17 g into feeding tube daily. 09/27/20  Yes Esaw Grandchild A, DO  propranolol (INDERAL) 10 MG tablet Place 1 tablet (10 mg total) into feeding tube 2 (two) times daily. 10/17/20  Yes Angiulli, Mcarthur Rossetti, PA-C  QUEtiapine (SEROQUEL) 100 MG tablet Place 1 tablet (100 mg total) into feeding tube at bedtime. 10/17/20  Yes Angiulli, Mcarthur Rossetti, PA-C  acetaminophen (TYLENOL) 160 MG/5ML solution Place 20.3 mLs (650 mg total) into feeding tube every 4 (four) hours as needed for moderate pain or fever. 09/26/20   Esaw Grandchild A, DO  benazepril (LOTENSIN) 20 MG tablet Take 20 mg by mouth daily. 10/03/20   [provider]  fenofibrate 160 MG tablet Take 160 mg by mouth daily. 12/11/16   [provider]  insulin detemir (LEVEMIR) 100 UNIT/ML injection Inject 0.06 mLs (6 Units total) into the skin 2 (two) times daily. 10/17/20   Angiulli, Mcarthur Rossetti, PA-C  JANUMET 50-1000 MG tablet Take 1 tablet by mouth 2 (two) times daily. 10/03/20   [provider]  metFORMIN (GLUCOPHAGE) 1000 MG tablet Take 1,000 mg by mouth 2 (two) times daily. 10/03/20   [provider]  nitroGLYCERIN (NITROSTAT) 0.4 MG SL tablet Place 1 tablet (0.4 mg total) under the tongue every 5 (five) minutes as needed for chest pain. 09/26/20   Pennie Banter, DO  Nutritional Supplements (FEEDING SUPPLEMENT, OSMOLITE 1.5 CAL,) LIQD Place 355 mLs into feeding tube 4 (four) times daily. 10/17/20   Angiulli, Mcarthur Rossetti, PA-C  pantoprazole sodium (PROTONIX) 40 mg PACK Place 20 mLs (40 mg total) into feeding tube 2 (two) times daily. 10/17/20   Angiulli, Mcarthur Rossetti, PA-C  rosuvastatin (CRESTOR) 20 MG tablet Place 1 tablet (20 mg total) into feeding tube daily. 10/18/20   Angiulli, Mcarthur Rossetti, PA-C  Water For Irrigation, Sterile (FREE WATER) SOLN Place 230 mLs into feeding tube 4 (four) times daily. 10/17/20   Angiulli, Mcarthur Rossetti, PA-C     Allergies    Patient has no known  allergies.   Review of Systems   Review of Systems A comprehensive review of systems was completed and negative except as noted in HPI.    Physical Exam BP 129/85 (BP Location: Left Arm)   Pulse 86   Temp 97.7 F (36.5 C) (Oral)   Resp 17   Ht 6' (1.829 m)   Wt 78.5 kg   SpO2 93%   BMI 23.47 kg/m   Physical Exam Vitals and nursing note reviewed.  Constitutional:      Appearance: Normal appearance.  HENT:     Head: Normocephalic and atraumatic.     Nose: Nose normal.     Mouth/Throat:     Mouth: Mucous membranes are moist.  Eyes:  Extraocular Movements: Extraocular movements intact.     Conjunctiva/sclera: Conjunctivae normal.  Cardiovascular:     Rate and Rhythm: Normal rate.  Pulmonary:     Effort: Pulmonary effort is normal.     Breath sounds: Normal breath sounds.  Abdominal:     General: Abdomen is flat.     Palpations: Abdomen is soft.     Tenderness: There is no abdominal tenderness.     Comments: PEG in LUQ, no active bleeding or signs of infection  Musculoskeletal:        General: No swelling. Normal range of motion.     Cervical back: Neck supple.  Skin:    General: Skin is warm and dry.  Neurological:     General: No focal deficit present.     Mental Status: He is alert.  Psychiatric:        Mood and Affect: Mood normal.     ED Results / Procedures / Treatments   Labs (all labs ordered are listed, but only abnormal results are displayed) Labs Reviewed - No data to display  EKG None  Radiology No results found.  Procedures Procedures  Medications Ordered in the ED Medications - No data to display   MDM Rules/Calculators/A&P MDM Patient reassured his PEG site looks good today. Will give a tube feed and return to SNF.   ED Course  I have reviewed the triage vital signs and the nursing notes.  Pertinent labs & imaging results that were available during my care of the patient were reviewed by me and considered in my medical  decision making (see chart for details).     Final Clinical Impression(s) / ED Diagnoses Final diagnoses:  Gastrostomy tube in place [facility])    Rx / DC Orders ED Discharge Orders     None        Pollyann Savoy, MD 11/04/20 1119

## 2020-11-04 NOTE — ED Triage Notes (Signed)
Pt brought in [facility] from [facility] for c/o abd pain around G-Tube site. Per staff at [facility] pt has been messing with his Gtube, hx of the same.  

## 2020-11-04 NOTE — ED Notes (Signed)
 residual checked prior to feed.

## 2020-11-05 ENCOUNTER — Emergency Department (HOSPITAL_COMMUNITY): Payer: Medicaid Other

## 2020-11-05 ENCOUNTER — Emergency Department (HOSPITAL_COMMUNITY)
Admission: EM | Admit: 2020-11-05 | Discharge: 2020-11-05 | Disposition: A | Discharge status: Home / Self Care | Payer: Medicaid Other | Attending: Emergency Medicine | Admitting: Emergency Medicine

## 2020-11-05 ENCOUNTER — Other Ambulatory Visit: Payer: Self-pay

## 2020-11-05 DIAGNOSIS — E782 Mixed hyperlipidemia: Secondary | ICD-10-CM | POA: Diagnosis not present

## 2020-11-05 DIAGNOSIS — I2510 Atherosclerotic heart disease of native coronary artery without angina pectoris: Secondary | ICD-10-CM | POA: Diagnosis not present

## 2020-11-05 DIAGNOSIS — E1169 Type 2 diabetes mellitus with other specified complication: Secondary | ICD-10-CM | POA: Insufficient documentation

## 2020-11-05 DIAGNOSIS — I10 Essential (primary) hypertension: Secondary | ICD-10-CM | POA: Insufficient documentation

## 2020-11-05 DIAGNOSIS — Z7902 Long term (current) use of antithrombotics/antiplatelets: Secondary | ICD-10-CM | POA: Diagnosis not present

## 2020-11-05 DIAGNOSIS — Z7984 Long term (current) use of oral hypoglycemic drugs: Secondary | ICD-10-CM | POA: Diagnosis not present

## 2020-11-05 DIAGNOSIS — S299XXA Unspecified injury of thorax, initial encounter: Secondary | ICD-10-CM | POA: Diagnosis present

## 2020-11-05 DIAGNOSIS — Z794 Long term (current) use of insulin: Secondary | ICD-10-CM | POA: Insufficient documentation

## 2020-11-05 DIAGNOSIS — S20219A Contusion of unspecified front wall of thorax, initial encounter: Secondary | ICD-10-CM | POA: Insufficient documentation

## 2020-11-05 DIAGNOSIS — Y92129 Unspecified place in nursing home as the place of occurrence of the external cause: Secondary | ICD-10-CM | POA: Diagnosis not present

## 2020-11-05 DIAGNOSIS — F17210 Nicotine dependence, cigarettes, uncomplicated: Secondary | ICD-10-CM | POA: Insufficient documentation

## 2020-11-05 DIAGNOSIS — W19XXXA Unspecified fall, initial encounter: Secondary | ICD-10-CM | POA: Diagnosis not present

## 2020-11-05 DIAGNOSIS — J441 Chronic obstructive pulmonary disease with (acute) exacerbation: Secondary | ICD-10-CM | POA: Insufficient documentation

## 2020-11-05 IMAGING — DX DG CHEST 2V
3 series · 4 of 4 positions shown · non-contrast
Comparison: Chest radiograph dated [DATE].

CLINICAL DATA: Chest wall pain.

EXAM:
CHEST - 2 VIEW

[chest ap (1 of 2)]
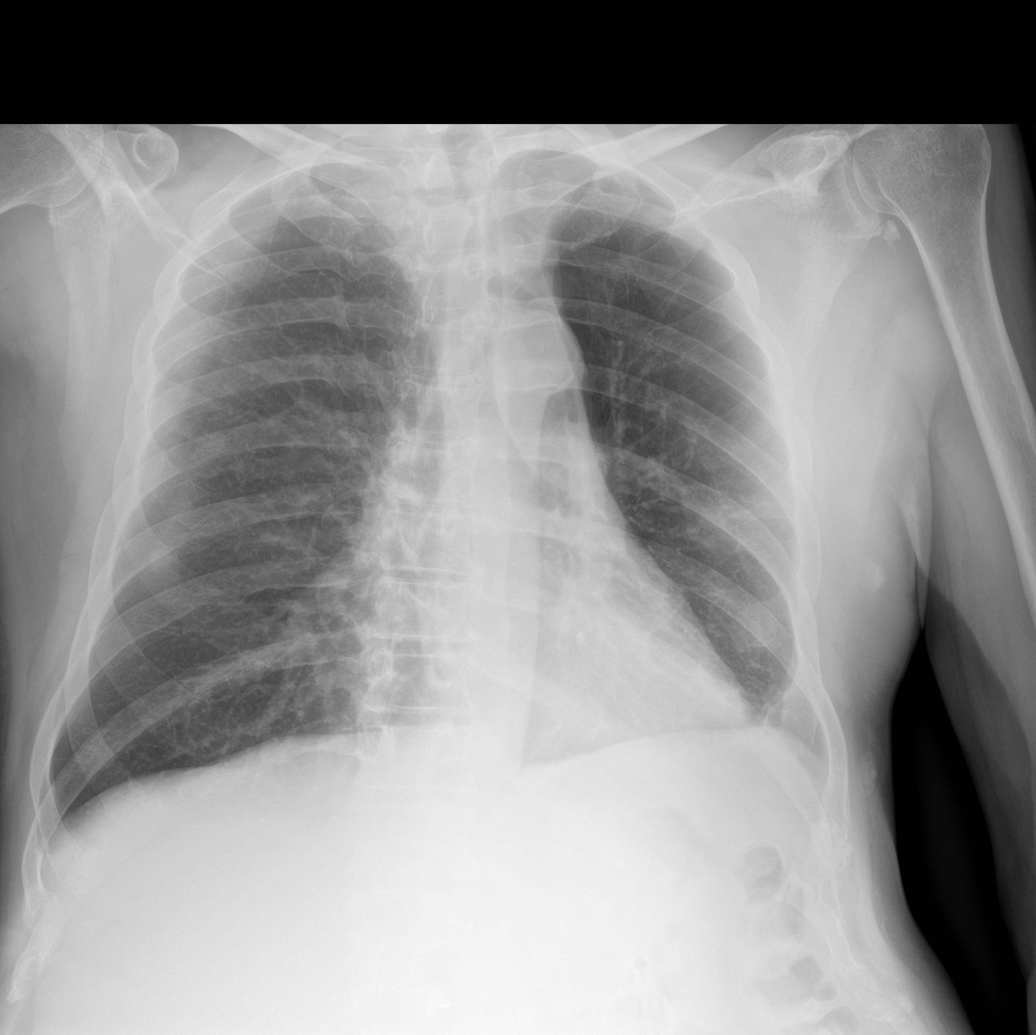

[Series 2: chest lat · 0.14mm/px · 2 of 2 slices shown]
[im 1/2]
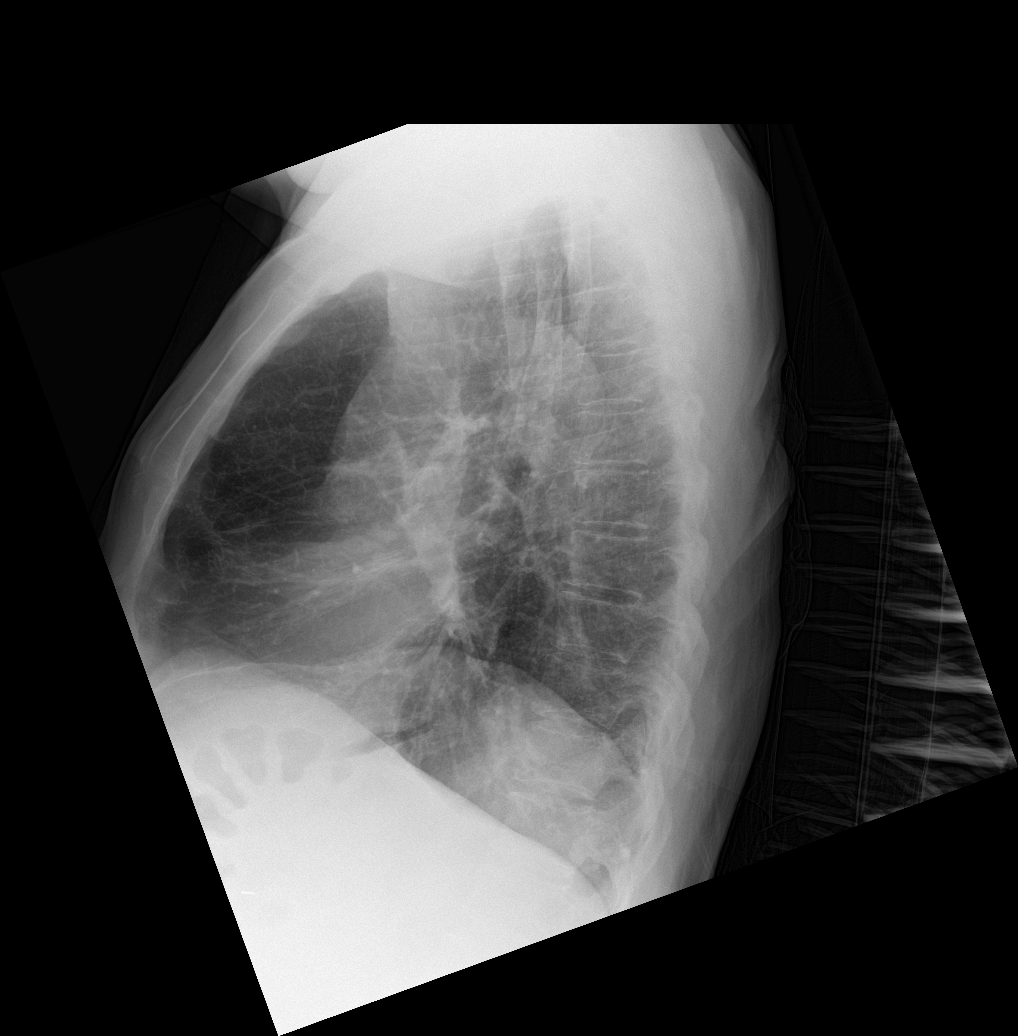
[im 2/2]
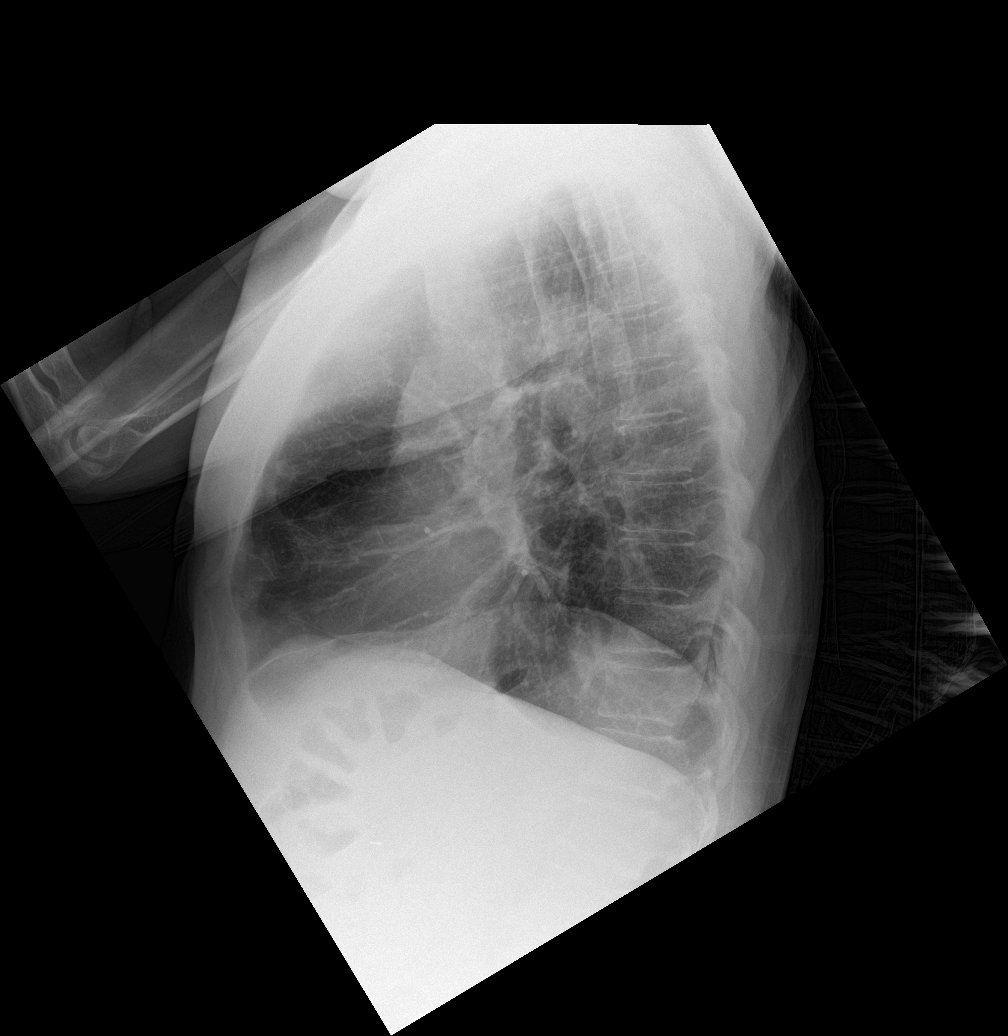

[chest ap (2 of 2)]
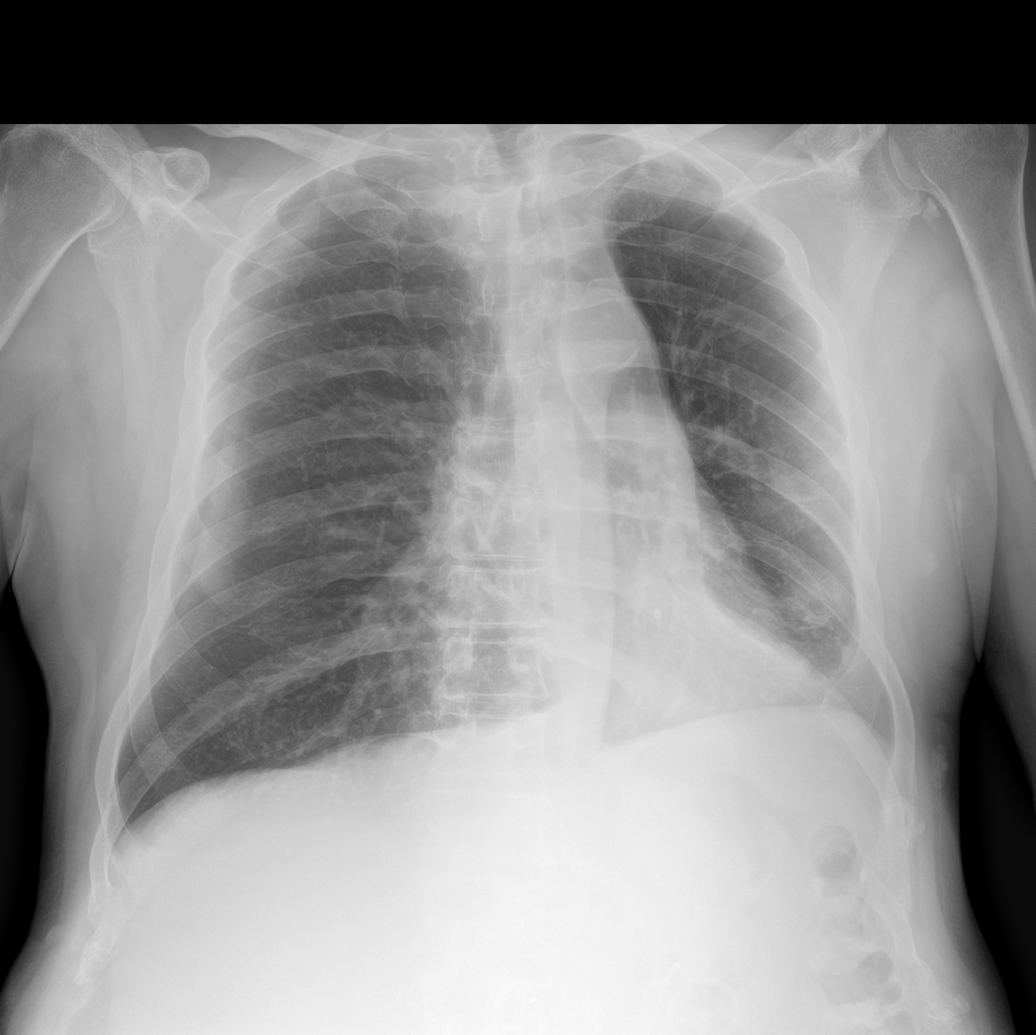

[4 of 4 positions shown; findings below may reference images not displayed]

FINDINGS: Faint streaky densities at the left lung base, likely atelectasis.
No focal consolidation, pleural effusion, or pneumothorax. Apparent
renal lucency along the periphery of the right lung on 1 of the AP
views is not seen on the other AP view and artifactual. The cardiac
silhouette is within limits. No acute osseous pathology.
IMPRESSION: No active cardiopulmonary disease.

## 2020-11-05 NOTE — ED Triage Notes (Signed)
PT BIB [facility] from [facility]. Staff reports pt has been throwing himself on the floor several times today. Currently c/o bilateral rib cage pain. No obvious injury. G Tube in place.

## 2020-11-05 NOTE — ED Provider Notes (Signed)
[name] EMERGENCY DEPARTMENT Provider Note   CSN: [id number] Arrival date & time: 11/05/20  2125     History Chief Complaint  Patient presents with   [name]    [name] is a 55 y.o. male.  Patient presents stating that he thinks he has broken ribs.  He says he fell at the nursing home earlier today.  Reports from nursing home are that he has been throwing himself on the floor.  Patient complaining of bilateral rib cage pain.  No other complaints at this time.  No evidence of head injury.      Past Medical History:  Diagnosis Date   Aplastic anemia, unspecified (HCC)    Chest pain, unspecified    Chronic airway obstruction, not elsewhere classified    Cocaine substance abuse (HCC)    Neuropathy    Other and unspecified hyperlipidemia    Shortness of breath    Stroke (HCC) 06/17/2020   Tobacco use disorder    Type II or unspecified type diabetes mellitus without mention of complication, not stated as uncontrolled    Unspecified epilepsy without mention of intractable epilepsy    Unspecified essential hypertension     Patient Active Problem List   Diagnosis Date Noted   Dysphagia, post-stroke    Adjustment disorder with mixed anxiety and depressed mood    Acute blood loss anemia 10/02/2020   PEG (percutaneous endoscopic gastrostomy) status (HCC)    Malnutrition of moderate degree 09/30/2020   S/P percutaneous endoscopic gastrostomy (PEG) tube placement (HCC)    Labile blood glucose    Leukocytosis    Goals of care, counseling/discussion 09/12/2020   Acute stroke due to ischemia (HCC) 09/11/2020   Acute respiratory failure with hypoxia (HCC) 09/11/2020   Aspiration pneumonitis (HCC) 09/11/2020   Dysarthria 09/11/2020   Sepsis due to undetermined organism (HCC) 09/11/2020   Cocaine abuse (HCC)    Acute CVA (cerebrovascular accident) (HCC) 07/25/2020   Acute on chronic respiratory failure with hypoxia (HCC) 11/04/2019   Rhinovirus infection 11/04/2019    Essential hypertension 11/04/2019   CAP (community acquired pneumonia) 11/03/2019   Diabetic polyneuropathy associated with diabetes mellitus due to underlying condition (HCC) 02/06/2019   Localization-related (focal) (partial) symptomatic epilepsy and epileptic syndromes with complex partial seizures, intractable, without status epilepticus (HCC) 02/06/2019   Mixed diabetic hyperlipidemia associated with type 2 diabetes mellitus (HCC) 11/26/2018   Cough 11/03/2018   Obstructive sleep apnea 11/03/2018   Dysphagia-----s/p Prior Uvulectomy and Now with Acute CVA 09/27/2018   Eczema 09/27/2018   Right leg weakness 01/19/2018   History of arthroscopy of knee 11/15/2017   Complex tear of lateral meniscus of right knee as current injury 10/27/2017   Effusion of right knee 07/26/2017   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   Cellulitis 07/22/2017   Primary osteoarthritis involving multiple joints 06/22/2017   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 02/01/2017   Right knee pain 02/01/2017   Acute bronchitis 12/07/2016   Acute idiopathic gout of left foot 12/07/2016   Anxiety, generalized 12/07/2016   COPD with acute exacerbation (HCC) 12/07/2016   Seizures (HCC) 12/07/2016   CAD (coronary artery disease) 12/19/2010   Uncontrolled type 2 diabetes mellitus, with long-term current use of insulin (HCC) 12/19/2010   Hypertension associated with diabetes (HCC) 12/19/2010   GERD (gastroesophageal reflux disease) 12/19/2010   Hypertriglyceridemia 12/19/2010   Tobacco abuse 12/19/2010    Past Surgical History:  Procedure Laterality Date   BIOPSY  10/02/2020  Procedure: BIOPSY;  Surgeon: Jenel Lucks, MD;  Location: [name] ENDOSCOPY;  Service: Gastroenterology;;   CARDIAC CATHETERIZATION  10/02/2010    Nonobstructive mild coronary plaque   ESOPHAGOGASTRODUODENOSCOPY N/A 10/02/2020   Procedure: ESOPHAGOGASTRODUODENOSCOPY (EGD);  Surgeon: Jenel Lucks, MD;   Location: [name] ENDOSCOPY;  Service: Gastroenterology;  Laterality: N/A;   IR GASTROSTOMY TUBE MOD SED  09/24/2020   KNEE SURGERY Bilateral 2018   Repair   PERFORATED VISCUS SURGERY     MULTIPLE FRACTURES       Family History  Problem Relation Age of Onset   Heart attack Father    COPD Father    Coronary artery disease Mother     Social History   Tobacco Use   Smoking status: Every Day    Packs/day: 1.00    Years: 30.00    Pack years: 30.00    Types: Cigarettes   Smokeless tobacco: Never  Vaping Use   Vaping Use: Never used  Substance Use Topics   Alcohol use: Yes    Comment: drinks beer on the weekends   Drug use: Yes    Types: Cocaine    Home Medications Prior to Admission medications   Medication Sig Start Date End Date Taking? Authorizing Provider  acetaminophen (TYLENOL) 160 MG/5ML solution Place 20.3 mLs (650 mg total) into feeding tube every 4 (four) hours as needed for moderate pain or fever. 09/26/20   Pennie Banter, DO  albuterol (VENTOLIN HFA) 108 (90 Base) MCG/ACT inhaler Inhale 2 puffs into the lungs every 6 (six) hours as needed for wheezing or shortness of breath. 10/17/20   Angiulli, Mcarthur Rossetti, PA-C  ALPRAZolam Prudy Feeler) 0.25 MG tablet Place 1 tablet (0.25 mg total) into feeding tube 3 (three) times daily as needed for anxiety. Patient taking differently: Place 0.5 mg into feeding tube 3 (three) times daily as needed for anxiety. 10/17/20   Angiulli, Mcarthur Rossetti, PA-C  benazepril (LOTENSIN) 20 MG tablet Take 20 mg by mouth daily. 10/03/20   [provider]  carBAMazepine (TEGRETOL) 100 MG/5ML suspension Place 10 mLs (200 mg total) into feeding tube every 6 (six) hours. 09/26/20   Pennie Banter, DO  clopidogrel (PLAVIX) 75 MG tablet Place 1 tablet (75 mg total) into feeding tube daily. 09/27/20   Pennie Banter, DO  Colchicine 0.6 MG CAPS 1 capsule by Feeding Tube route in the morning and at bedtime. 10/26/20   [provider]  escitalopram  (LEXAPRO) 10 MG tablet Place 1 tablet (10 mg total) into feeding tube daily. 10/18/20   Angiulli, Mcarthur Rossetti, PA-C  fenofibrate (TRICOR) 48 MG tablet Take 48 mg by mouth daily. 10/03/20   [provider]  fenofibrate 160 MG tablet Take 160 mg by mouth daily. 12/11/16   [provider]  HYDROcodone-acetaminophen (NORCO/VICODIN) 5-325 MG tablet Place 1 tablet into feeding tube daily as needed for moderate pain. 10/17/20   Angiulli, Mcarthur Rossetti, PA-C  insulin detemir (LEVEMIR) 100 UNIT/ML injection Inject 0.06 mLs (6 Units total) into the skin 2 (two) times daily. 10/17/20   Angiulli, Mcarthur Rossetti, PA-C  insulin glargine (LANTUS) 100 UNIT/ML injection Inject 6 Units into the skin 2 (two) times daily. For DM    [provider]  JANUMET 50-1000 MG tablet Take 1 tablet by mouth 2 (two) times daily. 10/03/20   [provider]  lidocaine (LIDODERM) 5 % Place 1 patch onto the skin daily. Remove & Discard patch within 12 hours or as directed by MD 10/18/20  Angiulli, Mcarthur Rossetti, PA-C  metFORMIN (GLUCOPHAGE) 1000 MG tablet Take 1,000 mg by mouth 2 (two) times daily. 10/03/20   [provider]  nitroGLYCERIN (NITROSTAT) 0.4 MG SL tablet Place 1 tablet (0.4 mg total) under the tongue every 5 (five) minutes as needed for chest pain. 09/26/20   Pennie Banter, DO  Nutritional Supplements (FEEDING SUPPLEMENT, OSMOLITE 1.5 CAL,) LIQD Place 355 mLs into feeding tube 4 (four) times daily. 10/17/20   Angiulli, Mcarthur Rossetti, PA-C  omeprazole (PRILOSEC) 20 MG capsule 20 mg daily.    [provider]  pantoprazole sodium (PROTONIX) 40 mg PACK Place 20 mLs (40 mg total) into feeding tube 2 (two) times daily. 10/17/20   Angiulli, Mcarthur Rossetti, PA-C  polyethylene glycol (MIRALAX / GLYCOLAX) 17 g packet Place 17 g into feeding tube daily. 09/27/20   Pennie Banter, DO  propranolol (INDERAL) 10 MG tablet Place 1 tablet (10 mg total) into feeding tube 2 (two) times daily. 10/17/20   Angiulli, Mcarthur Rossetti, PA-C   QUEtiapine (SEROQUEL) 100 MG tablet Place 1 tablet (100 mg total) into feeding tube at bedtime. 10/17/20   Angiulli, Mcarthur Rossetti, PA-C  rosuvastatin (CRESTOR) 20 MG tablet Place 1 tablet (20 mg total) into feeding tube daily. 10/18/20   Angiulli, Mcarthur Rossetti, PA-C  Water For Irrigation, Sterile (FREE WATER) SOLN Place 230 mLs into feeding tube 4 (four) times daily. 10/17/20   Angiulli, Mcarthur Rossetti, PA-C    Allergies    Patient has no known allergies.  Review of Systems   Review of Systems  Respiratory:  Negative for shortness of breath.   Musculoskeletal:        Bilateral rib pain  All other systems reviewed and are negative.  Physical Exam Updated Vital Signs BP 139/76 (BP Location: Right Arm)   Pulse 81   Ht 6' (1.829 m)   Wt 78.5 kg   SpO2 94%   BMI 23.46 kg/m   Physical Exam Vitals and nursing note reviewed.  Constitutional:      General: He is not in acute distress.    Appearance: Normal appearance. He is well-developed.  HENT:     Head: Normocephalic and atraumatic.     Right Ear: Hearing normal.     Left Ear: Hearing normal.     Nose: Nose normal.  Eyes:     Conjunctiva/sclera: Conjunctivae normal.     Pupils: Pupils are equal, round, and reactive to light.  Cardiovascular:     Rate and Rhythm: Regular rhythm.     Heart sounds: S1 normal and S2 normal. No murmur heard.   No friction rub. No gallop.  Pulmonary:     Effort: Pulmonary effort is normal. No respiratory distress.     Breath sounds: Normal breath sounds.  Chest:     Chest wall: Tenderness present.    Abdominal:     General: Bowel sounds are normal.     Palpations: Abdomen is soft.     Tenderness: There is no abdominal tenderness. There is no guarding or rebound. Negative signs include Murphy's sign and McBurney's sign.     Hernia: No hernia is present.  Musculoskeletal:        General: Normal range of motion.     Cervical back: Normal range of motion and neck supple.  Skin:    General: Skin is warm and  dry.     Findings: No rash.  Neurological:     Mental Status: He is alert and oriented to person,  place, and time.     GCS: GCS eye subscore is 4. GCS verbal subscore is 5. GCS motor subscore is 6.     Cranial Nerves: No cranial nerve deficit.     Sensory: No sensory deficit.     Coordination: Coordination normal.  Psychiatric:        Speech: Speech normal.        Behavior: Behavior normal.        Thought Content: Thought content normal.    ED Results / Procedures / Treatments   Labs (all labs ordered are listed, but only abnormal results are displayed) Labs Reviewed - No data to display  EKG None  Radiology DG Chest 2 View  Result Date: 11/05/2020 CLINICAL DATA:  Chest wall pain. EXAM: CHEST - 2 VIEW COMPARISON:  Chest radiograph dated 10/01/2020. FINDINGS: Faint streaky densities at the left lung base, likely atelectasis. No focal consolidation, pleural effusion, or pneumothorax. Apparent renal lucency along the periphery of the right lung on 1 of the AP views is not seen on the other AP view and artifactual. The cardiac silhouette is within limits. No acute osseous pathology. IMPRESSION: No active cardiopulmonary disease. Electronically Signed   By: Elgie Collard M.D.   On: 11/05/2020 22:08    Procedures Procedures   Medications Ordered in ED Medications - No data to display  ED Course  I have reviewed the triage vital signs and the nursing notes.  Pertinent labs & imaging results that were available during my care of the patient were reviewed by me and considered in my medical decision making (see chart for details).    MDM Rules/Calculators/A&P                           Patient presents with bilateral rib pain after alleged fall.  Examination does not reveal any outward evidence of injury, no bruising, abrasions.  Patient indicates tenderness of the anterior chest wall but there is no crepitance or deformity.  X-ray is unremarkable.  Abdominal exam benign, no  concern for solid organ injury.  Final Clinical Impression(s) / ED Diagnoses Final diagnoses:  Contusion of rib, unspecified laterality, initial encounter    Rx / DC Orders ED Discharge Orders     None        Gilda Crease, MD 11/05/20 2330

## 2020-11-18 ENCOUNTER — Emergency Department (HOSPITAL_COMMUNITY)
Admission: EM | Admit: 2020-11-18 | Discharge: 2020-11-18 | Disposition: A | Discharge status: Home / Self Care | Payer: Medicaid Other | Attending: Emergency Medicine | Admitting: Emergency Medicine

## 2020-11-18 ENCOUNTER — Telehealth: Payer: Self-pay | Admitting: Physical Medicine & Rehabilitation

## 2020-11-18 ENCOUNTER — Other Ambulatory Visit: Payer: Self-pay

## 2020-11-18 DIAGNOSIS — Z79899 Other long term (current) drug therapy: Secondary | ICD-10-CM | POA: Diagnosis not present

## 2020-11-18 DIAGNOSIS — J449 Chronic obstructive pulmonary disease, unspecified: Secondary | ICD-10-CM | POA: Insufficient documentation

## 2020-11-18 DIAGNOSIS — I2510 Atherosclerotic heart disease of native coronary artery without angina pectoris: Secondary | ICD-10-CM | POA: Diagnosis not present

## 2020-11-18 DIAGNOSIS — Z794 Long term (current) use of insulin: Secondary | ICD-10-CM | POA: Diagnosis not present

## 2020-11-18 DIAGNOSIS — F17210 Nicotine dependence, cigarettes, uncomplicated: Secondary | ICD-10-CM | POA: Insufficient documentation

## 2020-11-18 DIAGNOSIS — E119 Type 2 diabetes mellitus without complications: Secondary | ICD-10-CM | POA: Insufficient documentation

## 2020-11-18 DIAGNOSIS — I10 Essential (primary) hypertension: Secondary | ICD-10-CM | POA: Insufficient documentation

## 2020-11-18 DIAGNOSIS — Z7902 Long term (current) use of antithrombotics/antiplatelets: Secondary | ICD-10-CM | POA: Insufficient documentation

## 2020-11-18 DIAGNOSIS — Z431 Encounter for attention to gastrostomy: Secondary | ICD-10-CM | POA: Insufficient documentation

## 2020-11-18 NOTE — ED Notes (Signed)
EMS called for transport to [facility] at this time. Patient placed on bed pan with privacy screen at this.

## 2020-11-18 NOTE — ED Provider Notes (Signed)
[facility] EMERGENCY DEPARTMENT Provider Note   CSN: [id number] Arrival date & time: 11/18/20  1154     History Chief Complaint  Patient presents with   GI Problem    [name] is a 55 y.o. male with a past medical history significant for diabetes, stroke, with a G-tube in place for months.  Patient is reporting with issue with the G-tube and that it has been leaking recently.  Patient denies seeing leakage specifically around G-tube entry site, just reporting that he wakes up with his shirt wet every morning.  Patient reports no abdominal pain, no nausea, no vomiting, constipation, or diarrhea.  Patient denies any signs of infection, or other issue around the G-tube insertion site.   GI Problem      Past Medical History:  Diagnosis Date   Aplastic anemia, unspecified (HCC)    Chest pain, unspecified    Chronic airway obstruction, not elsewhere classified    Cocaine substance abuse (HCC)    Neuropathy    Other and unspecified hyperlipidemia    Shortness of breath    Stroke (HCC) 06/17/2020   Tobacco use disorder    Type II or unspecified type diabetes mellitus without mention of complication, not stated as uncontrolled    Unspecified epilepsy without mention of intractable epilepsy    Unspecified essential hypertension     Patient Active Problem List   Diagnosis Date Noted   Dysphagia, post-stroke    Adjustment disorder with mixed anxiety and depressed mood    Acute blood loss anemia 10/02/2020   PEG (percutaneous endoscopic gastrostomy) status (HCC)    Malnutrition of moderate degree 09/30/2020   S/P percutaneous endoscopic gastrostomy (PEG) tube placement (HCC)    Labile blood glucose    Leukocytosis    Goals of care, counseling/discussion 09/12/2020   Acute stroke due to ischemia (HCC) 09/11/2020   Acute respiratory failure with hypoxia (HCC) 09/11/2020   Aspiration pneumonitis (HCC) 09/11/2020   Dysarthria 09/11/2020   Sepsis due to undetermined  organism (HCC) 09/11/2020   Cocaine abuse (HCC)    Acute CVA (cerebrovascular accident) (HCC) 07/25/2020   Acute on chronic respiratory failure with hypoxia (HCC) 11/04/2019   Rhinovirus infection 11/04/2019   Essential hypertension 11/04/2019   CAP (community acquired pneumonia) 11/03/2019   Diabetic polyneuropathy associated with diabetes mellitus due to underlying condition (HCC) 02/06/2019   Localization-related (focal) (partial) symptomatic epilepsy and epileptic syndromes with complex partial seizures, intractable, without status epilepticus (HCC) 02/06/2019   Mixed diabetic hyperlipidemia associated with type 2 diabetes mellitus (HCC) 11/26/2018   Cough 11/03/2018   Obstructive sleep apnea 11/03/2018   Dysphagia-----s/p Prior Uvulectomy and Now with Acute CVA 09/27/2018   Eczema 09/27/2018   Right leg weakness 01/19/2018   History of arthroscopy of knee 11/15/2017   Complex tear of lateral meniscus of right knee as current injury 10/27/2017   Effusion of right knee 07/26/2017   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   Cellulitis 07/22/2017   Primary osteoarthritis involving multiple joints 06/22/2017   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 02/01/2017   Right knee pain 02/01/2017   Acute bronchitis 12/07/2016   Acute idiopathic gout of left foot 12/07/2016   Anxiety, generalized 12/07/2016   COPD with acute exacerbation (HCC) 12/07/2016   Seizures (HCC) 12/07/2016   CAD (coronary artery disease) 12/19/2010   Uncontrolled type 2 diabetes mellitus, with long-term current use of insulin 12/19/2010   Hypertension associated with diabetes (HCC) 12/19/2010   GERD (  gastroesophageal reflux disease) 12/19/2010   Hypertriglyceridemia 12/19/2010   Tobacco abuse 12/19/2010    Past Surgical History:  Procedure Laterality Date   BIOPSY  10/02/2020   Procedure: BIOPSY;  Surgeon: Jenel Lucks, MD;  Location: [facility] ENDOSCOPY;  Service:  Gastroenterology;;   CARDIAC CATHETERIZATION  10/02/2010    Nonobstructive mild coronary plaque   ESOPHAGOGASTRODUODENOSCOPY N/A 10/02/2020   Procedure: ESOPHAGOGASTRODUODENOSCOPY (EGD);  Surgeon: Jenel Lucks, MD;  Location: [facility] ENDOSCOPY;  Service: Gastroenterology;  Laterality: N/A;   IR GASTROSTOMY TUBE MOD SED  09/24/2020   KNEE SURGERY Bilateral 2018   Repair   PERFORATED VISCUS SURGERY     MULTIPLE FRACTURES       Family History  Problem Relation Age of Onset   Heart attack Father    COPD Father    Coronary artery disease Mother     Social History   Tobacco Use   Smoking status: Every Day    Packs/day: 1.00    Years: 30.00    Pack years: 30.00    Types: Cigarettes   Smokeless tobacco: Never  Vaping Use   Vaping Use: Never used  Substance Use Topics   Alcohol use: Yes    Comment: drinks beer on the weekends   Drug use: Yes    Types: Cocaine    Home Medications Prior to Admission medications   Medication Sig Start Date End Date Taking? Authorizing Provider  escitalopram (LEXAPRO) 20 MG tablet Take 20 mg by mouth daily.   Yes [provider]  acetaminophen (TYLENOL) 160 MG/5ML solution Place 20.3 mLs (650 mg total) into feeding tube every 4 (four) hours as needed for moderate pain or fever. 09/26/20   Pennie Banter, DO  albuterol (VENTOLIN HFA) 108 (90 Base) MCG/ACT inhaler Inhale 2 puffs into the lungs every 6 (six) hours as needed for wheezing or shortness of breath. 10/17/20   Angiulli, Mcarthur Rossetti, PA-C  ALPRAZolam Prudy Feeler) 0.5 MG tablet Take 0.5 mg by mouth 3 (three) times daily as needed. 11/04/20   [provider]  carBAMazepine (TEGRETOL) 100 MG/5ML suspension Place 10 mLs (200 mg total) into feeding tube every 6 (six) hours. 09/26/20   Pennie Banter, DO  clopidogrel (PLAVIX) 75 MG tablet Place 1 tablet (75 mg total) into feeding tube daily. 09/27/20   Pennie Banter, DO  Colchicine 0.6 MG CAPS 1 capsule by Feeding Tube route in the  morning and at bedtime. 10/26/20   [provider]  fenofibrate 160 MG tablet Take 160 mg by mouth daily. 12/11/16   [provider]  HYDROcodone-acetaminophen (NORCO/VICODIN) 5-325 MG tablet Place 1 tablet into feeding tube daily as needed for moderate pain. 10/17/20   Angiulli, Mcarthur Rossetti, PA-C  insulin detemir (LEVEMIR) 100 UNIT/ML injection Inject 0.06 mLs (6 Units total) into the skin 2 (two) times daily. 10/17/20   Angiulli, Mcarthur Rossetti, PA-C  insulin glargine (LANTUS) 100 UNIT/ML injection Inject 6 Units into the skin 2 (two) times daily. For DM    [provider]  JANUMET 50-1000 MG tablet Take 1 tablet by mouth 2 (two) times daily. 10/03/20   [provider]  lidocaine (LIDODERM) 5 % Place 1 patch onto the skin daily. Remove & Discard patch within 12 hours or as directed by MD 10/18/20   Angiulli, Mcarthur Rossetti, PA-C  nitroGLYCERIN (NITROSTAT) 0.4 MG SL tablet Place 1 tablet (0.4 mg total) under the tongue every 5 (five) minutes as needed for chest pain. 09/26/20   Esaw Grandchild  A, DO  Nutritional Supplements (FEEDING SUPPLEMENT, OSMOLITE 1.5 CAL,) LIQD Place 355 mLs into feeding tube 4 (four) times daily. 10/17/20   Angiulli, Mcarthur Rossetti, PA-C  omeprazole (PRILOSEC) 20 MG capsule 20 mg daily.    [provider]  pantoprazole sodium (PROTONIX) 40 mg PACK Place 20 mLs (40 mg total) into feeding tube 2 (two) times daily. 10/17/20   Angiulli, Mcarthur Rossetti, PA-C  polyethylene glycol (MIRALAX / GLYCOLAX) 17 g packet Place 17 g into feeding tube daily. 09/27/20   Pennie Banter, DO  propranolol (INDERAL) 10 MG tablet Place 1 tablet (10 mg total) into feeding tube 2 (two) times daily. 10/17/20   Angiulli, Mcarthur Rossetti, PA-C  QUEtiapine (SEROQUEL) 100 MG tablet Place 1 tablet (100 mg total) into feeding tube at bedtime. 10/17/20   Angiulli, Mcarthur Rossetti, PA-C  rosuvastatin (CRESTOR) 20 MG tablet Place 1 tablet (20 mg total) into feeding tube daily. 10/18/20   Angiulli, Mcarthur Rossetti, PA-C  Water For  Irrigation, Sterile (FREE WATER) SOLN Place 230 mLs into feeding tube 4 (four) times daily. 10/17/20   Angiulli, Mcarthur Rossetti, PA-C    Allergies    Patient has no known allergies.  Review of Systems   Review of Systems  Skin:        G-tube leak  All other systems reviewed and are negative.  Physical Exam Updated Vital Signs BP 116/69 (BP Location: Right Arm)   Pulse 69   Temp 97.9 F (36.6 C) (Axillary)   Resp 16   Ht 6' (1.829 m)   Wt 80 kg   SpO2 96%   BMI 23.92 kg/m   Physical Exam Vitals and nursing note reviewed.  Constitutional:      General: He is not in acute distress.    Appearance: Normal appearance.  HENT:     Head: Normocephalic and atraumatic.  Eyes:     General:        Right eye: No discharge.        Left eye: No discharge.  Cardiovascular:     Rate and Rhythm: Normal rate and regular rhythm.  Pulmonary:     Effort: Pulmonary effort is normal. No respiratory distress.  Abdominal:     General: There is no distension.     Palpations: Abdomen is soft.     Tenderness: There is no abdominal tenderness.     Comments: G-tube in left upper quadrant is housed properly.  Tube is in place.  Flushes without difficulty.  No evidence of maceration or skin breakdown underneath the G-tube.  No evidence of swelling, redness, or purulent drainage.  No tenderness to palpation of the entire abdomen.  Musculoskeletal:        General: No deformity.  Skin:    General: Skin is warm and dry.  Neurological:     Mental Status: He is alert and oriented to person, place, and time.  Psychiatric:        Mood and Affect: Mood normal.        Behavior: Behavior normal.    ED Results / Procedures / Treatments   Labs (all labs ordered are listed, but only abnormal results are displayed) Labs Reviewed - No data to display  EKG None  Radiology No results found.  Procedures Procedures   Medications Ordered in ED Medications - No data to display  ED Course  I have reviewed  the triage vital signs and the nursing notes.  Pertinent labs & imaging results that were available during  my care of the patient were reviewed by me and considered in my medical decision making (see chart for details).    MDM Rules/Calculators/A&P                         Patient reports with issue with leaking G-tube.  G-tube evaluated and appears patent, flushes easily, no evidence of leaking while flushing in the department today.  Patient has no abdominal pain, or signs of infection around the G-tube insertion site.  Potentially leaking due to leaving the tube uncapped after nightly feedings.  Patient discharged in stable condition, return precautions given.  Recommended follow-up with GI, interventional radiology as needed for further G-tube abnormalities. Final Clinical Impression(s) / ED Diagnoses Final diagnoses:  Attention to G-tube [facility])    Rx / DC Orders ED Discharge Orders     None        [place] 11/18/20 1321    Linwood Dibbles, MD 11/18/20 2009

## 2020-11-18 NOTE — ED Notes (Signed)
Patient yelling and cussing at staff because patient refused to lie on his back to use a bedpan and states "I would rather lay on my side and shit on myself." Attempted to redirect patient to use the bed pan and patient continues to refuse. Patient's brief changed.

## 2020-11-18 NOTE — Telephone Encounter (Signed)
Facility patient is in currently following patient for PT Pain Manangement. They don't beleive they need this appointment Please advise if patient will need to keep hospital Follow up

## 2020-11-18 NOTE — ED Notes (Addendum)
Patient's g-tube flushed with sprite at this time. No leaking or clotting noted. Patient denies pain and g-tube site is clean and drains to gravity. Patient state's he is ready to go back now.

## 2020-11-18 NOTE — ED Triage Notes (Signed)
Patient presents today from [facility] due to g-tube leaking per SNF. Patient has hx of coming to ED for same or g-tube clotting. Per SNF, MD told them to send the patient to fix the g-tube in the ED today. Patient denies abdominal pain and states he just wants to go back to the facility and lay in his bed. A & O X4.

## 2020-11-18 NOTE — Discharge Instructions (Addendum)
There is no evidence of any issue, or infection with your G-tube today.  It did not leak when we flushed it.  It is possible that it was left open during her feedings since it was open when you arrived at the emergency department today.  If you continue to have persistent problems I recommend a follow-up with your GI doctor, or interventional radiology to discuss replacement or further care of the G-tube.  Please return to the emergency department if you begin to have signs of infection, significant abdominal pain or are unable to eat secondary to a clog in your G-tube.

## 2020-11-19 NOTE — Telephone Encounter (Signed)
Left Message for Naomie at [facility] [phone] advised this is follow up for Stroke but if they would like to reschedule for patient, to call our office

## 2020-12-02 ENCOUNTER — Telehealth: Payer: Self-pay | Admitting: Neurology

## 2020-12-02 NOTE — Telephone Encounter (Signed)
Patient's friend, Clydie Braun, called requesting to speak with a nurse about the patient's follow up care.  She said he is currently at [facility] and has had four strokes but not been to see Dr. Karel Jarvis yet.  She said the office was supposed to be hearing from someone named Janelle Floor at the facility to coordinate care.

## 2020-12-03 ENCOUNTER — Other Ambulatory Visit (HOSPITAL_COMMUNITY): Payer: Self-pay | Admitting: Internal Medicine

## 2020-12-03 DIAGNOSIS — R1319 Other dysphagia: Secondary | ICD-10-CM

## 2020-12-04 ENCOUNTER — Ambulatory Visit (HOSPITAL_COMMUNITY)
Admission: RE | Admit: 2020-12-04 | Discharge: 2020-12-04 | Disposition: A | Discharge status: Home / Self Care | Payer: Medicaid Other | Source: Ambulatory Visit | Attending: Internal Medicine | Admitting: Internal Medicine

## 2020-12-04 ENCOUNTER — Encounter: Payer: Medicaid Other | Admitting: Physical Medicine & Rehabilitation

## 2020-12-04 ENCOUNTER — Other Ambulatory Visit (HOSPITAL_COMMUNITY): Payer: Self-pay | Admitting: Internal Medicine

## 2020-12-04 ENCOUNTER — Other Ambulatory Visit: Payer: Self-pay

## 2020-12-04 DIAGNOSIS — K9423 Gastrostomy malfunction: Secondary | ICD-10-CM | POA: Insufficient documentation

## 2020-12-04 DIAGNOSIS — R1319 Other dysphagia: Secondary | ICD-10-CM | POA: Diagnosis not present

## 2020-12-04 HISTORY — PX: IR CM INJ ANY COLONIC TUBE W/FLUORO: IMG2336

## 2020-12-04 IMAGING — XA IR GI TUBE CONTRAST INJECTION
1 series · 5 of 5 positions shown · non-contrast
Comparison: none

INDICATION: tube leaking

[Series 1: fl (-) angio · 2 acquisitions, 5 frames shown]
[im 1/2]
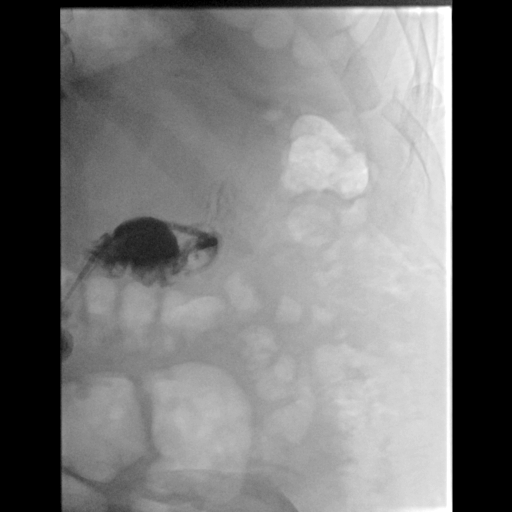
[im 1/2]
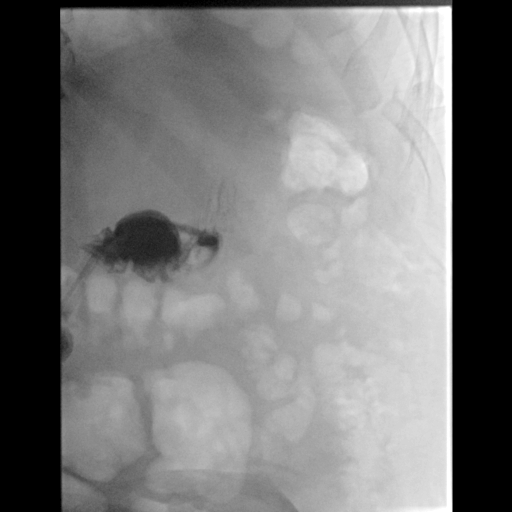
[im 1/2]
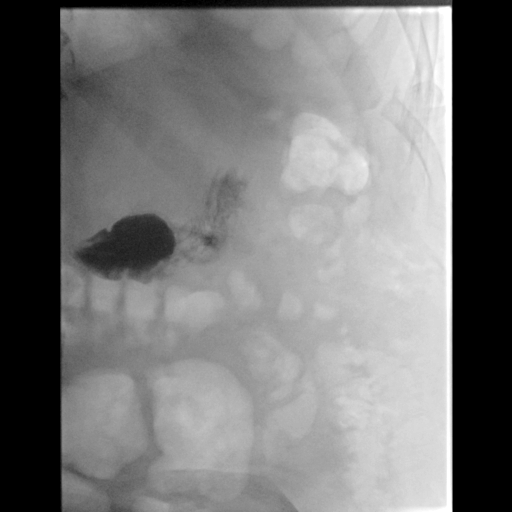
[im 1/2]
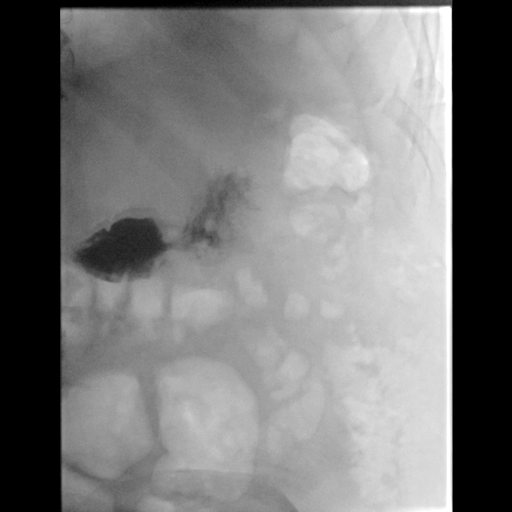
[im 2/2]
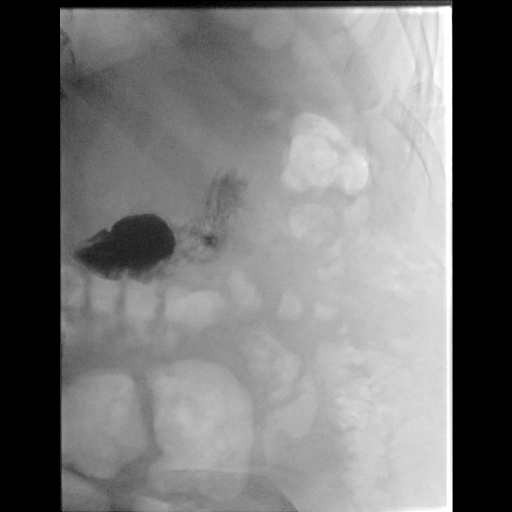

[5 of 5 positions shown; findings below may reference images not displayed]

EXAM:
Gastrostomy tube check using fluoroscopy

MEDICATIONS:
None

ANESTHESIA/SEDATION:
None

CONTRAST:  10 mL-administered into the gastric lumen.

FLUOROSCOPY TIME:  Fluoroscopy Time: 0.3 minutes with 2 exposures

COMPLICATIONS:
None immediate.

PROCEDURE:
Informed written consent was obtained from the patient after a
thorough discussion of the procedural risks, benefits and
alternatives. All questions were addressed. Maximal Sterile Barrier
Technique was utilized including caps, mask, sterile gowns, sterile
gloves, sterile drape, hand hygiene and skin antiseptic. A timeout
was performed prior to the initiation of the procedure.

The patient was placed supine on the exam table. The existing
gastrostomy tube a visually inspected, found to have a defect in the
tubing. Decision was made to proceed with repair of the gastrostomy
tube hub. The gastrostomy tube was cut close to the old hub. A new
hub was then placed and secured to the gastrostomy tube. The
gastrostomy tube was injected with contrast material, and was found
to function appropriately with contrast material in gastric lumen.
It is well positioned and ready for use. The patient tolerated the
procedure well without immediate complication.
IMPRESSION: Successful repair of the gastrostomy tube defect with replacement of
a new hub which was securely connected. The gastrostomy tube is well
positioned and ready for immediate use.

## 2020-12-04 MED ORDER — IOHEXOL 300 MG/ML  SOLN
50.0000 mL | Freq: Once | INTRAMUSCULAR | Status: AC | PRN
  Administered 2020-12-04: 10 mL

## 2020-12-04 NOTE — Telephone Encounter (Signed)
I have new patient slots next week (10/25 and 10/26), since this is a new problem, pls put on new patient slot, thanks!

## 2020-12-09 ENCOUNTER — Telehealth: Payer: Medicaid Other | Admitting: Neurology

## 2020-12-11 ENCOUNTER — Ambulatory Visit: Payer: Medicaid Other | Admitting: Neurology

## 2021-01-15 ENCOUNTER — Encounter: Payer: Self-pay | Admitting: Physical Medicine & Rehabilitation

## 2021-01-15 ENCOUNTER — Encounter: Payer: Medicaid Other | Attending: Physical Medicine & Rehabilitation | Admitting: Physical Medicine & Rehabilitation

## 2021-01-15 ENCOUNTER — Other Ambulatory Visit: Payer: Self-pay

## 2021-01-15 VITALS — BP 116/65 | HR 47 | Temp 98.3°F

## 2021-01-15 DIAGNOSIS — I69391 Dysphagia following cerebral infarction: Secondary | ICD-10-CM | POA: Diagnosis not present

## 2021-01-15 DIAGNOSIS — F411 Generalized anxiety disorder: Secondary | ICD-10-CM | POA: Diagnosis not present

## 2021-01-15 DIAGNOSIS — Z8673 Personal history of transient ischemic attack (TIA), and cerebral infarction without residual deficits: Secondary | ICD-10-CM | POA: Insufficient documentation

## 2021-01-15 NOTE — Patient Instructions (Addendum)
   THERAPY FOCUS:  LEFT SHOULDER RANGE OF MOTION, ELBOW RANGE OF MOTION AS WELL (EXTENSION). PT HAS BICEPS SPASTICITY  WORK ON STANDING AND PRE-GAIT ACTIVITIES. PT WILL NEED ENCOURAGEMENT AND POSITIVE REINFORCEMENT     MED RECS:  FOR SPASTICITY: TRIAL OF BACLOFEN 10MG  NIGHTLY FOR 4 DAYS THEN TWICE DAILY THEREAFTER  FOR PAIN: BEGIN HYDROCODONE 5/325 TWICE DAILY FOR BETTER PAIN CONTROL. PLEASE GIVE 30 MINUTES PRIOR TO THERAPY SESSION   F/U WITH ME IN 2 MONTHS.

## 2021-01-15 NOTE — Progress Notes (Signed)
Subjective:    Patient ID: Brent [name], male    DOB: 04-25-1965, 55 y.o.   MRN: [id number]   HPI This is a follow-up office visit for [name] after his inpatient rehab admission the summer.  I last saw him in the hospital in August.  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  He was discharged to SNF.  Course was complicated by significant anxiety as well as suicidal ideations.  He was made DNR and palliative care was involved during his stay.  He has chronic low back pain which continues to be a problem since discharge to the facility.  I asked him what he has been working on with therapy and he says he has been working on strength as well as transfers.  He has not stood yet with therapy.  He says that he needs to walk before he is able to return home.  He does report improved appetite.  He still has problems with incontinence.  His sleep has been a bit better.  He is also complaining of a lot of left shoulder pain which limits him with transfers and movement.  He is at chronic left shoulder arthritis/rotator cuff disease predating his stroke.  Pain Inventory Average Pain 9 Pain Right Now 9 My pain is sharp  LOCATION OF PAIN  back  BOWEL Number of stools per week: 7 Oral laxative use No  Type of laxative na Enema or suppository use No  History of colostomy No  Incontinent Yes   BLADDER Normal In and out cath, frequency . Able to self cath  . Bladder incontinence Yes  Frequent urination No  Leakage with coughing No  Difficulty starting stream No  Incomplete bladder emptying No    Mobility use a wheelchair  Function disabled: date disabled . I need assistance with the following:  dressing, bathing, and toileting  Neuro/Psych trouble walking  Prior Studies Any changes since last visit?  no  Physicians involved in  your care Any changes since last visit?  no   Family History  Problem Relation Age of Onset   Heart attack Father    COPD Father    Coronary artery disease Mother    Social History   Socioeconomic History   Marital status: Single    Spouse name: Not on file   Number of children: Not on file   Years of education: Not on file   Highest education level: Not on file  Occupational History   Not on file  Tobacco Use   Smoking status: Every Day    Packs/day: 1.00    Years: 30.00    Pack years: 30.00    Types: Cigarettes   Smokeless tobacco: Never  Vaping Use   Vaping Use: Never used  Substance and Sexual Activity   Alcohol use: Yes    Comment: drinks beer on the weekends   Drug use: Yes    Types: Cocaine   Sexual activity: Not Currently  Other Topics Concern   Not on file  Social History Narrative   Lives in [place] with wife.   Has one daughter and one grandson.   Right handed    Social Determinants of Health   Financial Resource Strain: Not on file  Food Insecurity: Not on file  Transportation Needs: Not on file  Physical Activity: Not on file  Stress: Not on  file  Social Connections: Not on file   Past Surgical History:  Procedure Laterality Date   BIOPSY  10/02/2020   Procedure: BIOPSY;  Surgeon: Jenel Lucks, MD;  Location: [facility] ENDOSCOPY;  Service: Gastroenterology;;   CARDIAC CATHETERIZATION  10/02/2010    Nonobstructive mild coronary plaque   ESOPHAGOGASTRODUODENOSCOPY N/A 10/02/2020   Procedure: ESOPHAGOGASTRODUODENOSCOPY (EGD);  Surgeon: Jenel Lucks, MD;  Location: [facility] ENDOSCOPY;  Service: Gastroenterology;  Laterality: N/A;   IR CM INJ ANY COLONIC TUBE W/FLUORO  12/04/2020   IR GASTROSTOMY TUBE MOD SED  09/24/2020   KNEE SURGERY Bilateral 2018   Repair   PERFORATED VISCUS SURGERY     MULTIPLE FRACTURES   Past Medical History:  Diagnosis Date   Aplastic anemia, unspecified (HCC)    Chest pain, unspecified    Chronic airway obstruction, not  elsewhere classified    Cocaine substance abuse (HCC)    Neuropathy    Other and unspecified hyperlipidemia    Shortness of breath    Stroke (HCC) 06/17/2020   Tobacco use disorder    Type II or unspecified type diabetes mellitus without mention of complication, not stated as uncontrolled    Unspecified epilepsy without mention of intractable epilepsy    Unspecified essential hypertension    BP 116/65   Pulse (!) 47   Temp 98.3 F (36.8 C) (Axillary)   SpO2 93%   Opioid Risk Score:   Fall Risk Score:  `1  Depression screen PHQ 2/9  No flowsheet data found.    Review of Systems Limited by behavior    Objective:   Physical Exam Gen: no distress, normal appearing HEENT: oral mucosa pink and moist, NCAT Cardio: Reg rate Chest: normal effort, normal rate of breathing Abd: soft, non-distended Ext: no edema Psych: pleasant, normal affect Skin: intact Neuro: Patient is alert and oriented to self and place but not date.  Mild left central 7.  Left upper extremity grossly 2-3 out of 5 limited due to pain.  He has some spasticity in the left biceps at 2 out of 4.  Left lower extremity is grossly 1-2 out of 5 proximal to distal.  No resting tone in the left lower extremity.  Thus since pain and light touch in both left arm and leg Musculoskeletal: Left shoulder limited by pain during any type of attempts at abduction or internal/external rotation.  Low back tender to palpation       Assessment & Plan:  1.  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  Spasticity left upper extremity related to above 3.  Adhesive capsulitis left rotator cuff syndrome 4.  Chronic low back pain 5.  Severe anxiety with depression and history of suicidal ideations   Plan: 1.  Made recommendations for therapy to address left shoulder range of motion exercises as well as bicep  extension exercises.  I told the patient that he himself will need to push and work through some of his pain to improve his functional outcomes so that he can get home. 2.  Recommend trial of baclofen 10 mg nightly for 4 days then twice daily thereafter to address spasticity left upper extremity.  Patient ultimately might benefit from Botox 3.  Chronic low back pain and left shoulder pain: Recommend increasing hydrocodone to twice daily to better address his pain and to help with activity tolerance  -Consider left shoulder injection 4.  Anxiety with depression: Patient has demonstrated some improvement here.  Continue current regimen.  Team should practice positive reinforcement  Fifteen minutes of face to face patient care time were spent during this visit. All questions were encouraged and answered.  Follow up with me in 2 mos .

## 2021-04-09 ENCOUNTER — Other Ambulatory Visit (HOSPITAL_COMMUNITY): Payer: Self-pay | Admitting: Family

## 2021-04-09 ENCOUNTER — Telehealth (HOSPITAL_COMMUNITY): Payer: Self-pay

## 2021-04-09 DIAGNOSIS — Z431 Encounter for attention to gastrostomy: Secondary | ICD-10-CM

## 2021-04-09 NOTE — Telephone Encounter (Signed)
Called Brent [name] to schedule peg removal, no answer from facility, no vm. AW

## 2021-04-14 ENCOUNTER — Telehealth (HOSPITAL_COMMUNITY): Payer: Self-pay

## 2021-04-14 NOTE — Telephone Encounter (Signed)
Returned call to [facility] about reschedule. No answer, left vm. AW

## 2021-04-15 ENCOUNTER — Ambulatory Visit (HOSPITAL_COMMUNITY): Payer: Medicaid Other

## 2021-04-16 ENCOUNTER — Encounter: Payer: Medicaid Other | Attending: Physical Medicine & Rehabilitation | Admitting: Physical Medicine & Rehabilitation

## 2021-04-16 ENCOUNTER — Encounter: Payer: Self-pay | Admitting: Physical Medicine & Rehabilitation

## 2021-04-16 ENCOUNTER — Other Ambulatory Visit: Payer: Self-pay

## 2021-04-16 VITALS — BP 126/76 | HR 62 | Ht 72.0 in

## 2021-04-16 DIAGNOSIS — M7502 Adhesive capsulitis of left shoulder: Secondary | ICD-10-CM | POA: Insufficient documentation

## 2021-04-16 DIAGNOSIS — I639 Cerebral infarction, unspecified: Secondary | ICD-10-CM | POA: Insufficient documentation

## 2021-04-16 NOTE — Patient Instructions (Signed)
PLEASE FEEL FREE TO CALL OUR OFFICE WITH ANY PROBLEMS OR QUESTIONS ([phone])      

## 2021-04-16 NOTE — Progress Notes (Signed)
? ?Subjective:  ? ? Patient ID: Brent [name], male    DOB: 1965/08/03, 56 y.o.   MRN: [id number] ? ?HPI ? ?Brent [name] is here in follow of his right ACA infarct. Therapy is working with him on LE strengthening, standing. He is standing primarily on the right leg. He says he can't "use" his left leg. His leg is still pretty tight. He is on baclofen 10mg  bid currently. He is primarily relegated to a w/c for all his mobility. Hiss mood has been stable to ipmroved. He still deals with anxiety.  ? ?He continues to have pain in his left shoulder and knee with basic movement. He tells me therapy has done some work in these areas.  ? ?Pain Inventory ?Average Pain 8 ?Pain Right Now 0 ?My pain is intermittent and dull ? ?LOCATION OF PAIN  Back ? ?BOWEL ?Number of stools per week: 7 ? ? ?BLADDER ?Normal ? ? ? ?Mobility ?ability to climb steps?  no ?do you drive?  no ?use a wheelchair ?needs help with transfers ?Do you have any goals in this area?  yes ? ?Function ?disabled: date disabled 09/2020 ? ?Neuro/Psych ?trouble walking ? ?Prior Studies ?Any changes since last visit?  no ? ?Physicians involved in your care ?Any changes since last visit?  no ? ? ?Family History  ?Problem Relation Age of Onset  ? Heart attack Father   ? COPD Father   ? Coronary artery disease Mother   ? ?Social History  ? ?Socioeconomic History  ? Marital status: Single  ?  Spouse name: Not on file  ? Number of children: Not on file  ? Years of education: Not on file  ? Highest education level: Not on file  ?Occupational History  ? Not on file  ?Tobacco Use  ? Smoking status: Every Day  ?  Packs/day: 1.00  ?  Years: 30.00  ?  Pack years: 30.00  ?  Types: Cigarettes  ? Smokeless tobacco: Never  ?Vaping Use  ? Vaping Use: Never used  ?Substance and Sexual Activity  ? Alcohol use: Yes  ?  Comment: drinks beer on the weekends  ? Drug use: Yes  ?  Types: Cocaine  ? Sexual activity: Not Currently  ?Other Topics Concern  ? Not on file  ?Social History  Narrative  ? Lives in [place] with wife.  ? Has one daughter and one grandson.  ? Right handed   ? ?Social Determinants of Health  ? ?Financial Resource Strain: Not on file  ?Food Insecurity: Not on file  ?Transportation Needs: Not on file  ?Physical Activity: Not on file  ?Stress: Not on file  ?Social Connections: Not on file  ? ?Past Surgical History:  ?Procedure Laterality Date  ? BIOPSY  10/02/2020  ? Procedure: BIOPSY;  Surgeon: Daryel November, MD;  Location[facility] ENDOSCOPY;  Service: Gastroenterology;;  ? CARDIAC CATHETERIZATION  10/02/2010  ?  Nonobstructive mild coronary plaque  ? ESOPHAGOGASTRODUODENOSCOPY N/A 10/02/2020  ? Procedure: ESOPHAGOGASTRODUODENOSCOPY (EGD);  Surgeon: Daryel November, MD;  Location: [facility];  Service: Gastroenterology;  Laterality: N/A;  ? IR CM INJ ANY COLONIC TUBE W/FLUORO  12/04/2020  ? IR GASTROSTOMY TUBE MOD SED  09/24/2020  ? KNEE SURGERY Bilateral 2018  ? Repair  ? PERFORATED VISCUS SURGERY    ? MULTIPLE FRACTURES  ? ?Past Medical History:  ?Diagnosis Date  ? Aplastic anemia, unspecified ([place])   ? Chest pain, unspecified   ? Chronic airway obstruction, not elsewhere classified   ?  Cocaine substance abuse ([place])   ? Neuropathy   ? Other and unspecified hyperlipidemia   ? Shortness of breath   ? Stroke [facility]) 06/17/2020  ? Tobacco use disorder   ? Type II or unspecified type diabetes mellitus without mention of complication, not stated as uncontrolled   ? Unspecified epilepsy without mention of intractable epilepsy   ? Unspecified essential hypertension   ? ?BP 126/76   Pulse 62   Ht 6' (1.829 m)   SpO2 94%   BMI 23.92 kg/m?  ? ?Opioid Risk Score:   ?Fall Risk Score:  `1 ? ?Depression screen PHQ 2/9 ? ?Depression screen [facility] 2/9 04/16/2021 01/15/2021  ?Decreased Interest 0 0  ?Down, Depressed, Hopeless 0 0  ?PHQ - 2 Score 0 0  ?Altered sleeping - 0  ?Tired, decreased energy - 0  ?Change in appetite - 0  ?Feeling bad or failure about yourself  - 0  ?Trouble concentrating -  0  ?Moving slowly or fidgety/restless - 0  ?Suicidal thoughts - 0  ?PHQ-9 Score - 0  ?  ? ?Review of Systems  ?Constitutional: Negative.   ?HENT: Negative.    ?Eyes: Negative.   ?Respiratory: Negative.    ?Cardiovascular: Negative.   ?Gastrointestinal: Negative.   ?Endocrine: Negative.   ?Genitourinary: Negative.   ?Musculoskeletal:  Positive for back pain and gait problem.  ?Skin: Negative.   ?Allergic/Immunologic: Negative.   ?Hematological: Negative.   ?Psychiatric/Behavioral: Negative.    ? ?   ?Objective:  ? Physical Exam ? ?General: No acute distress ?HEENT: NCAT, EOMI, oral membranes moist ?Cards: reg rate  ?Chest: normal effort ?Abdomen: Soft, NT, ND ?Skin: dry, intact ?Extremities: no edema ?Psych: pleasant and appropriate  ?Skin: intact ?Neuro: Patient is alert and oriented to self and place but not date.  Mild left central 7.  Left upper extremity grossly 2-3 out of 5 limited due to pain.  He has some spasticity in the left biceps at 2 out of 4.  Left lower extremity is grossly 1-2 out of 5 proximal to distal.  slight hamstring tone 1/4.   Thus since pain and light touch in both left arm and leg ?Musculoskeletal: Left shoulder limited by pain during minimal rom. Left hamstring tendons tight as well. Could range knee to -20 degrees ?  ?  ?  ?  ?  ?Assessment & Plan:  ?1.  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  Spasticity left upper extremity related to above ?3.  Adhesive capsulitis left rotator cuff syndrome ?4.  Chronic low back pain ?5.  Severe anxiety with depression and history of suicidal ideations ?  ?  ?Plan: ?1.  Therapy to continue to address left shoulder range of motion exercises as well as bicep extension exercises.  He needs to push and work through some of his pain to improve his functional outcomes so that he can get home. Needs DAILY stretches.  ?2.  Increase  abaclofen to 10mg  tid ?3.  After informed consent and preparation of the skin with betadine and isopropyl alcohol, I injected 6mg  (1cc) of celestone and 4cc of 1% lidocaine into the left subacromial via posterior approach. Additionally, aspiration was performed prior to injection. The patient tolerated well, and no complications were encountered. Afterward the area was cleaned and dressed. Post- injection instructions were provided.   ?              ?  4.  Anxiety with depression: Patient has demonstrated some improvement here.  Continue current regimen.  Team should practice positive reinforcement ?  ?Fifteen minutes of face to face patient care time were spent during this visit. All questions were encouraged and answered.  Follow up with me in 3 mos .  ? ? ? ?   ?

## 2021-04-21 ENCOUNTER — Other Ambulatory Visit: Payer: Self-pay

## 2021-04-21 ENCOUNTER — Ambulatory Visit (HOSPITAL_COMMUNITY)
Admission: RE | Admit: 2021-04-21 | Discharge: 2021-04-21 | Disposition: A | Discharge status: Home / Self Care | Payer: Medicaid Other | Source: Ambulatory Visit | Attending: Family | Admitting: Family

## 2021-04-21 DIAGNOSIS — Z431 Encounter for attention to gastrostomy: Secondary | ICD-10-CM | POA: Insufficient documentation

## 2021-04-21 HISTORY — PX: IR GASTROSTOMY TUBE REMOVAL: IMG5492

## 2021-04-21 MED ORDER — LIDOCAINE VISCOUS HCL 2 % MT SOLN
OROMUCOSAL | Status: AC
  Filled 2021-04-21: qty 15

## 2021-06-27 DIAGNOSIS — G4700 Insomnia, unspecified: Secondary | ICD-10-CM | POA: Insufficient documentation

## 2021-07-01 ENCOUNTER — Ambulatory Visit: Payer: Medicaid Other | Admitting: Internal Medicine

## 2021-07-01 NOTE — Progress Notes (Deleted)
Read Drivers, male    DOB: June 05, 1965, 56 y.o.   MRN: [id number]   Brief patient profile:  ***  yo*** *** referred to pulmonary clinic in [place]  07/01/2021 by *** for ***      History of Present Illness  07/01/2021  Pulmonary/ 1st office eval/ Sherene Sires /  Office  No chief complaint on file.    Dyspnea:  *** Cough: *** Sleep: *** SABA use:   Past Medical History:  Diagnosis Date   Aplastic anemia, unspecified (HCC)    Chest pain, unspecified    Chronic airway obstruction, not elsewhere classified    Cocaine substance abuse (HCC)    Neuropathy    Other and unspecified hyperlipidemia    Shortness of breath    Stroke (HCC) 06/17/2020   Tobacco use disorder    Type II or unspecified type diabetes mellitus without mention of complication, not stated as uncontrolled    Unspecified epilepsy without mention of intractable epilepsy    Unspecified essential hypertension     Outpatient Medications Prior to Visit  Medication Sig Dispense Refill   acetaminophen (TYLENOL) 160 MG/5ML solution Place 20.3 mLs (650 mg total) into feeding tube every 4 (four) hours as needed for moderate pain or fever. 120 mL 0   albuterol (VENTOLIN HFA) 108 (90 Base) MCG/ACT inhaler Inhale 2 puffs into the lungs every 6 (six) hours as needed for wheezing or shortness of breath.     ALPRAZolam (XANAX) 0.5 MG tablet Take 0.5 mg by mouth 3 (three) times daily as needed.     carBAMazepine (TEGRETOL) 100 MG/5ML suspension Place 10 mLs (200 mg total) into feeding tube every 6 (six) hours. 450 mL 12   clopidogrel (PLAVIX) 75 MG tablet Place 1 tablet (75 mg total) into feeding tube daily.     Colchicine 0.6 MG CAPS 1 capsule by Feeding Tube route in the morning and at bedtime.     escitalopram (LEXAPRO) 20 MG tablet Take 20 mg by mouth daily.     fenofibrate 160 MG tablet Take 160 mg by mouth daily.     HYDROcodone-acetaminophen (NORCO/VICODIN) 5-325 MG tablet Place 1 tablet into feeding tube daily  as needed for moderate pain. 3 tablet 0   insulin detemir (LEVEMIR) 100 UNIT/ML injection Inject 0.06 mLs (6 Units total) into the skin 2 (two) times daily. 10 mL 11   insulin glargine (LANTUS) 100 UNIT/ML injection Inject 6 Units into the skin 2 (two) times daily. For DM     JANUMET 50-1000 MG tablet Take 1 tablet by mouth 2 (two) times daily.     lidocaine (LIDODERM) 5 % Place 1 patch onto the skin daily. Remove & Discard patch within 12 hours or as directed by MD 3 patch 0   nitroGLYCERIN (NITROSTAT) 0.4 MG SL tablet Place 1 tablet (0.4 mg total) under the tongue every 5 (five) minutes as needed for chest pain.  12   Nutritional Supplements (FEEDING SUPPLEMENT, OSMOLITE 1.5 CAL,) LIQD Place 355 mLs into feeding tube 4 (four) times daily.  0   omeprazole (PRILOSEC) 20 MG capsule 20 mg daily.     pantoprazole sodium (PROTONIX) 40 mg PACK Place 20 mLs (40 mg total) into feeding tube 2 (two) times daily. 30 packet    polyethylene glycol (MIRALAX / GLYCOLAX) 17 g packet Place 17 g into feeding tube daily. 14 each 0   propranolol (INDERAL) 10 MG tablet Place 1 tablet (10 mg total) into feeding tube 2 (two) times daily.  QUEtiapine (SEROQUEL) 100 MG tablet Place 1 tablet (100 mg total) into feeding tube at bedtime. 3 tablet 0   rosuvastatin (CRESTOR) 20 MG tablet Place 1 tablet (20 mg total) into feeding tube daily.     Water For Irrigation, Sterile (FREE WATER) SOLN Place 230 mLs into feeding tube 4 (four) times daily.     No facility-administered medications prior to visit.     Objective:     There were no vitals taken for this visit.         Assessment   No problem-specific Assessment & Plan notes found for this encounter.     Brent Hughs, MD 07/01/2021

## 2021-07-10 ENCOUNTER — Encounter (HOSPITAL_COMMUNITY): Payer: Self-pay | Admitting: *Deleted

## 2021-07-10 ENCOUNTER — Emergency Department (HOSPITAL_COMMUNITY)
Admission: EM | Admit: 2021-07-10 | Discharge: 2021-07-11 | Disposition: A | Discharge status: Home / Self Care | Payer: Medicaid Other | Attending: Emergency Medicine | Admitting: Emergency Medicine

## 2021-07-10 ENCOUNTER — Other Ambulatory Visit: Payer: Self-pay

## 2021-07-10 ENCOUNTER — Emergency Department (HOSPITAL_COMMUNITY): Payer: Medicaid Other

## 2021-07-10 DIAGNOSIS — Z7901 Long term (current) use of anticoagulants: Secondary | ICD-10-CM | POA: Insufficient documentation

## 2021-07-10 DIAGNOSIS — L03311 Cellulitis of abdominal wall: Secondary | ICD-10-CM | POA: Insufficient documentation

## 2021-07-10 HISTORY — DX: Dysphagia, unspecified: R13.10

## 2021-07-10 HISTORY — DX: Anxiety disorder, unspecified: F41.9

## 2021-07-10 HISTORY — DX: Hyperlipidemia, unspecified: E78.5

## 2021-07-10 HISTORY — DX: Bipolar disorder, unspecified: F31.9

## 2021-07-10 LAB — COMPREHENSIVE METABOLIC PANEL
ALT: 20 U/L (ref 0–44)
AST: 13 U/L — ABNORMAL LOW (ref 15–41)
Albumin: 3.4 g/dL — ABNORMAL LOW (ref 3.5–5.0)
Alkaline Phosphatase: 82 U/L (ref 38–126)
Anion gap: 5 (ref 5–15)
BUN: 19 mg/dL (ref 6–20)
CO2: 30 mmol/L (ref 22–32)
Calcium: 9.2 mg/dL (ref 8.9–10.3)
Chloride: 101 mmol/L (ref 98–111)
Creatinine, Ser: 1.11 mg/dL (ref 0.61–1.24)
GFR, Estimated: >60 mL/min (ref >60)
Glucose, Bld: 232 mg/dL — ABNORMAL HIGH (ref 70–99)
Potassium: 4.3 mmol/L (ref 3.5–5.1)
Sodium: 136 mmol/L (ref 135–145)
Total Bilirubin: 0.5 mg/dL (ref 0.3–1.2)
Total Protein: 7.6 g/dL (ref 6.5–8.1)

## 2021-07-10 LAB — CBC WITH DIFFERENTIAL/PLATELET
Abs Immature Granulocytes: 0.05 10*3/uL (ref 0.00–0.07)
Basophils Absolute: 0.1 10*3/uL (ref 0.0–0.1)
Basophils Relative: 1 %
Eosinophils Absolute: 0.3 10*3/uL (ref 0.0–0.5)
Eosinophils Relative: 3 %
HCT: 36.8 % — ABNORMAL LOW (ref 39.0–52.0)
Hemoglobin: 12.1 g/dL — ABNORMAL LOW (ref 13.0–17.0)
Immature Granulocytes: 1 %
Lymphocytes Relative: 5 %
Lymphs Abs: 0.5 10*3/uL — ABNORMAL LOW (ref 0.7–4.0)
MCH: 28.7 pg (ref 26.0–34.0)
MCHC: 32.9 g/dL (ref 30.0–36.0)
MCV: 87.4 fL (ref 80.0–100.0)
Monocytes Absolute: 0.8 10*3/uL (ref 0.1–1.0)
Monocytes Relative: 7 %
Neutro Abs: 8.7 10*3/uL — ABNORMAL HIGH (ref 1.7–7.7)
Neutrophils Relative %: 83 %
Platelets: 324 10*3/uL (ref 150–400)
RBC: 4.21 MIL/uL — ABNORMAL LOW (ref 4.22–5.81)
RDW: 14 % (ref 11.5–15.5)
WBC: 10.4 10*3/uL (ref 4.0–10.5)
nRBC: 0 % (ref 0.0–0.2)

## 2021-07-10 LAB — LACTIC ACID, PLASMA
Lactic Acid, Venous: 1.3 mmol/L (ref 0.5–1.9)
Lactic Acid, Venous: 0.8 mmol/L (ref 0.5–1.9)

## 2021-07-10 IMAGING — DX DG ABDOMEN 1V
1 series · 1 of 1 positions shown · non-contrast
Comparison: [DATE]

CLINICAL DATA: Abdominal pain

EXAM:
ABDOMEN - 1 VIEW

[abdomen supine]
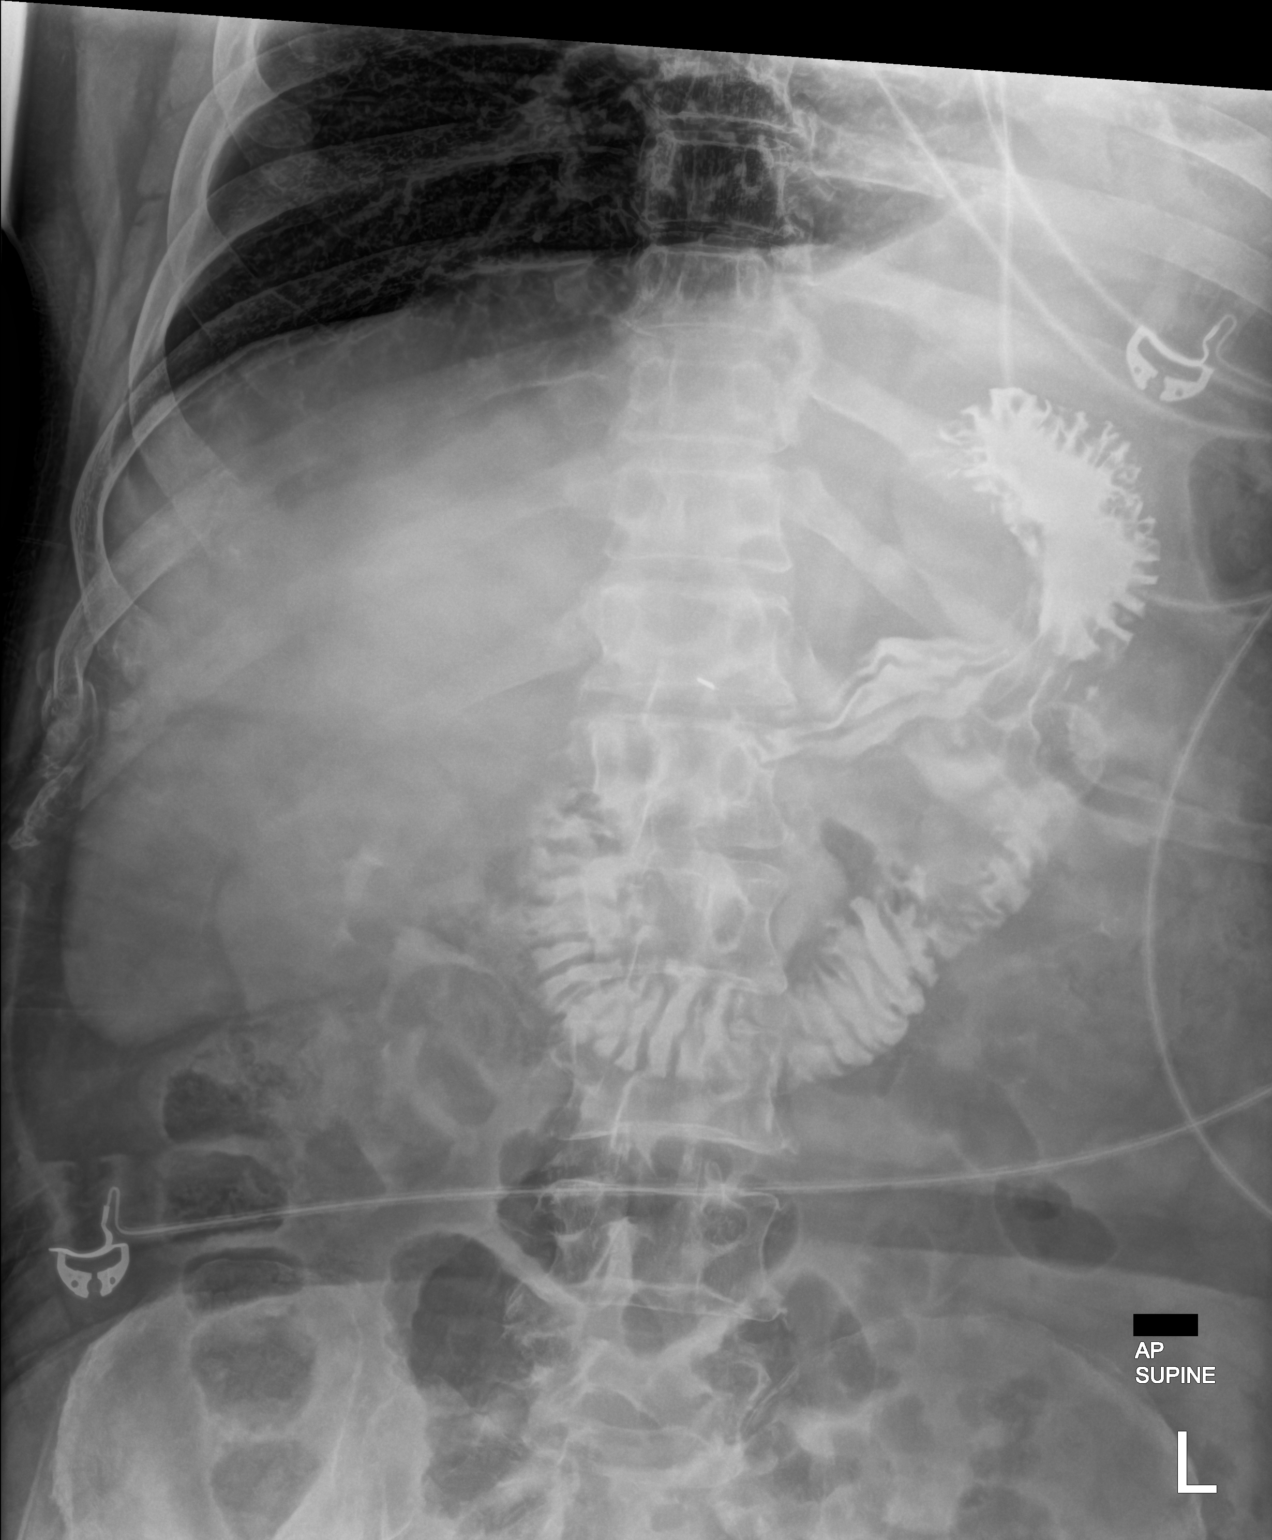

[1 of 1 positions shown; findings below may reference images not displayed]

FINDINGS: Contrast was injected through the gastrostomy tube. This is seen
within the stomach and duodenum. Possible contrast seen adjacent to
the greater curvature of the stomach, possibly within the abdominal
wall although this is difficult to confirm. No bowel obstruction. No
organomegaly or free air.
IMPRESSION: Injection of contrast through the gastrostomy tube which is
predominantly in the stomach and duodenum. There may be a small
amount of extraluminal contrast adjacent to the gastric button and
greater curvature of the stomach, possibly within the anterior
abdominal wall although difficult to confirm on this single image.
If felt indicated, noncontrast CT may be helpful.

## 2021-07-10 IMAGING — CT CT ABD-PELV W/ CM
2 of 5 series · 12 of 46 positions shown, 14 images · IV contrast (Omnipaque or Isovue)
Comparison: None Available.

CLINICAL DATA: Abdominal pain, acute, nonlocalized Pretty
significant abdominal cellulitis around the PEG tube site. Patient
with low-grade fever

EXAM:
CT ABDOMEN AND PELVIS WITH CONTRAST
TECHNIQUE: Multidetector CT imaging of the abdomen and pelvis was performed
using the standard protocol following bolus administration of
intravenous contrast.

[Series 2: axial st · axial · 0.66mm/px · z∈[+773,+1247]mm · 9 of 109 slices shown, 11 images]
[im 7/109  soft-tissue]
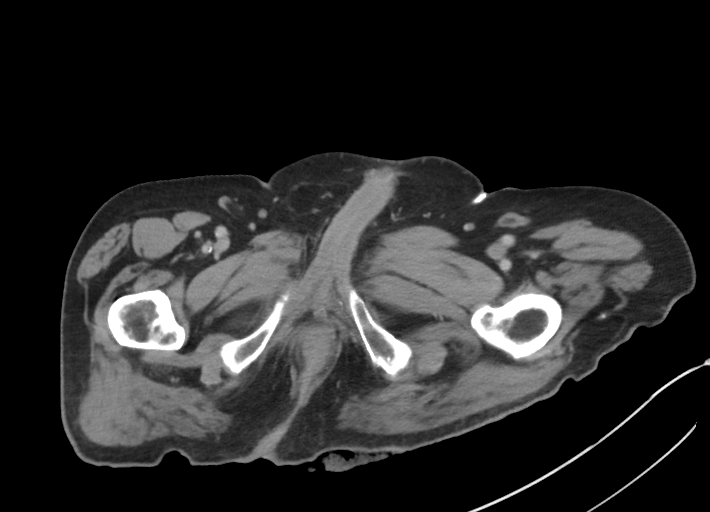
[im 7/109  bone]
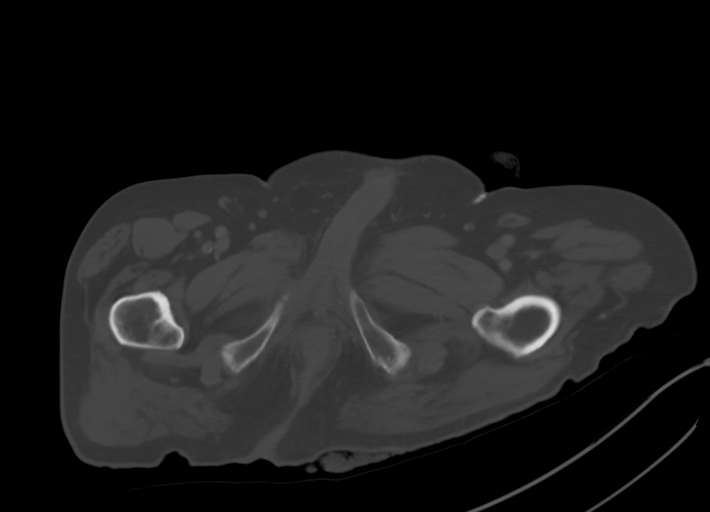
[im 20/109  soft-tissue]
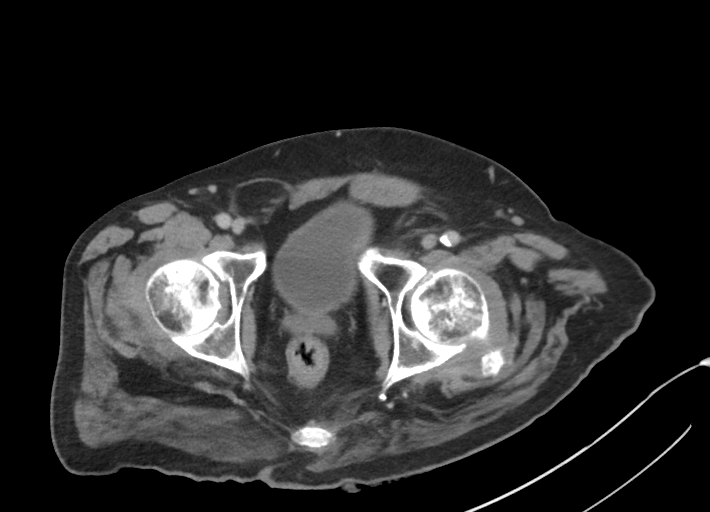
[im 32/109  soft-tissue]
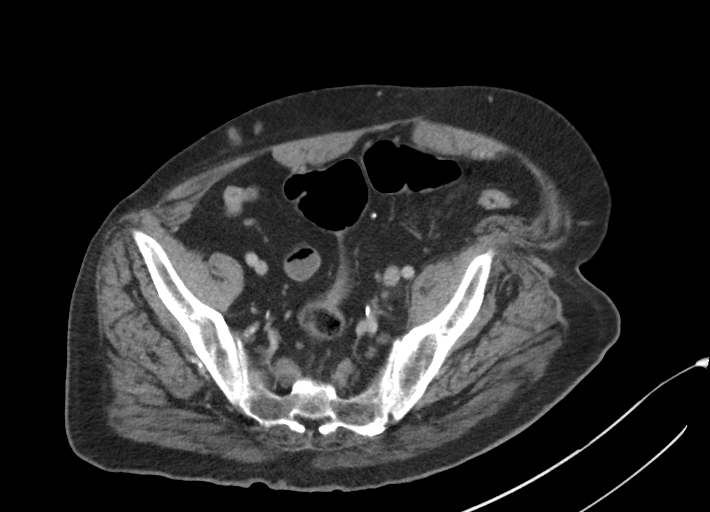
[im 45/109  soft-tissue]
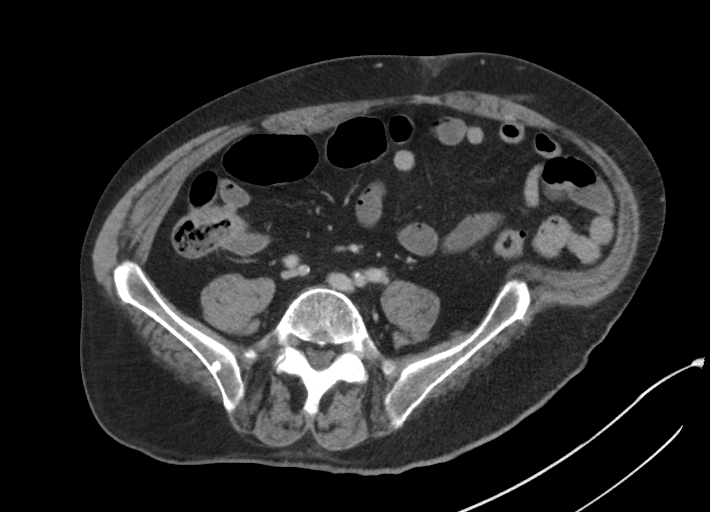
[im 58/109  soft-tissue]
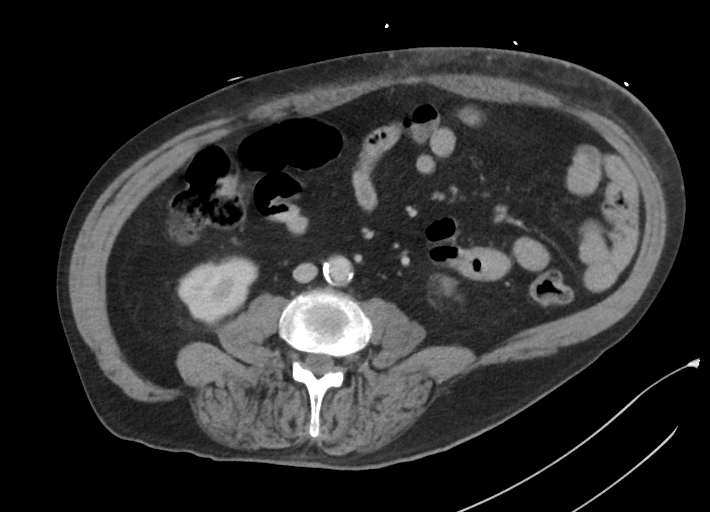
[im 64/109  soft-tissue]
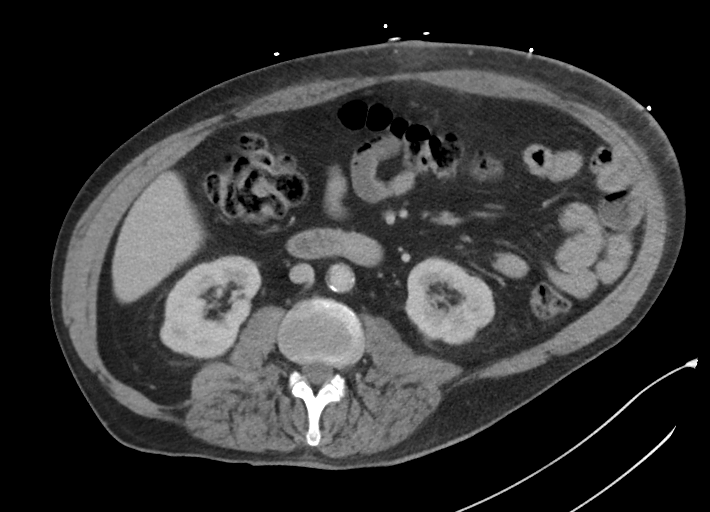
[im 77/109  soft-tissue]
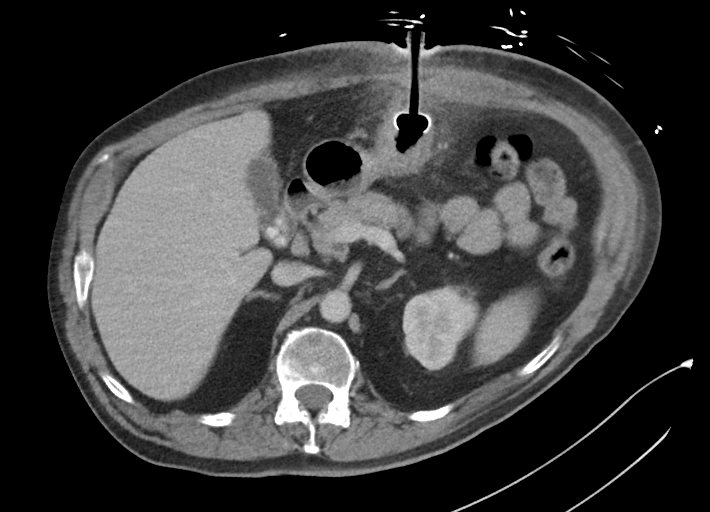
[im 89/109  soft-tissue]
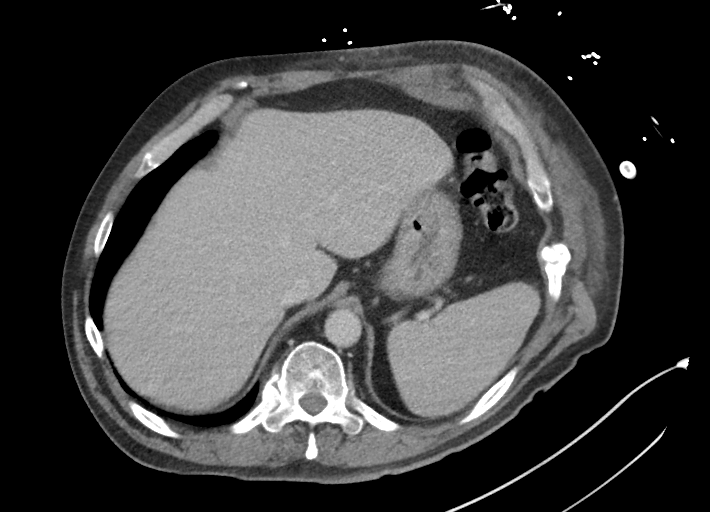
[im 102/109  soft-tissue]
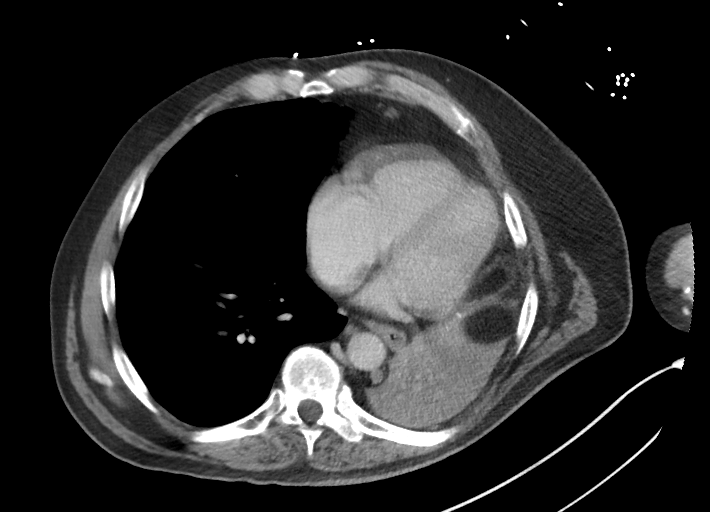
[im 102/109  bone]
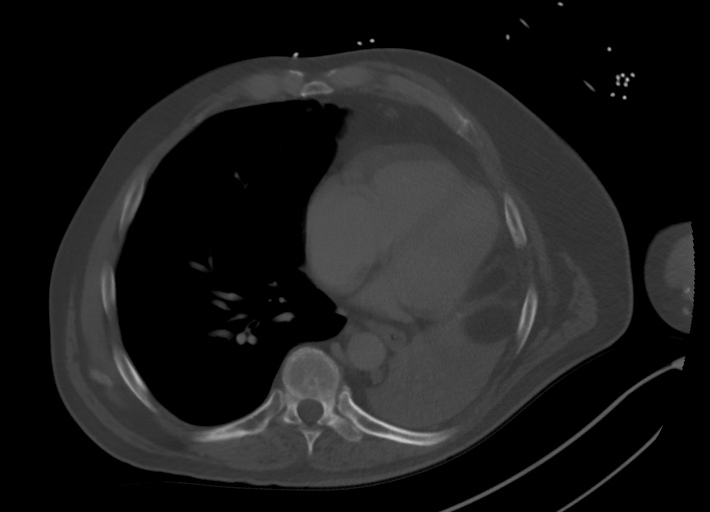

[Series 5: coronal st · coronal · 0.92mm/px · 3 of 109 slices shown]
[im 37/109  soft-tissue]
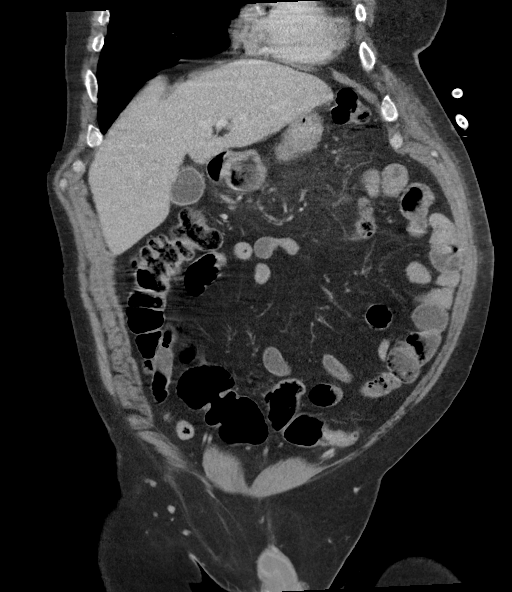
[im 49/109  soft-tissue]
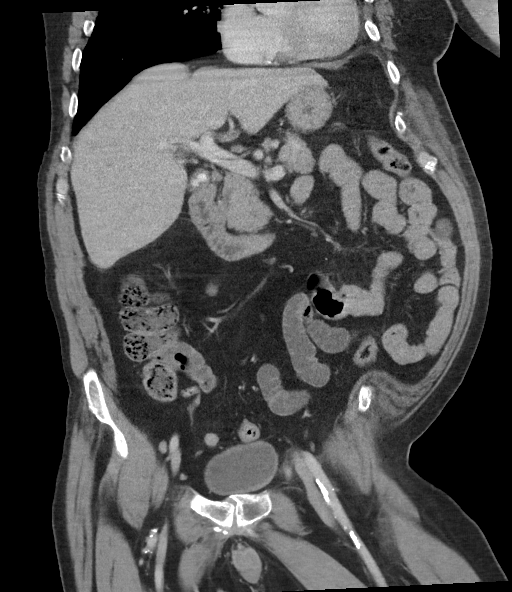
[im 61/109  soft-tissue]
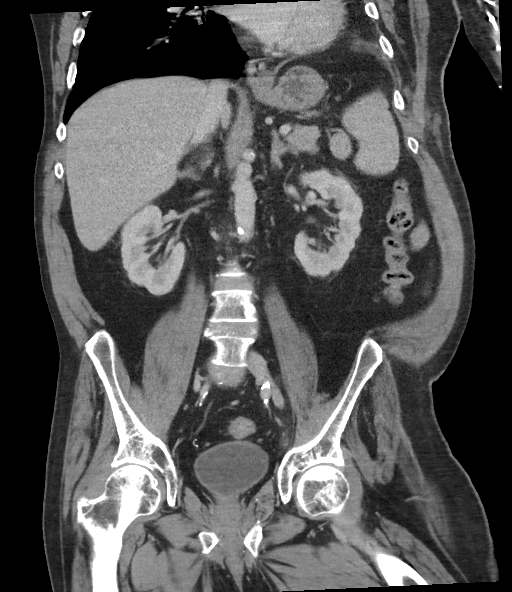

[12 of 46 positions shown; findings below may reference images not displayed]

RADIATION DOSE REDUCTION: This exam was performed according to the
departmental dose-optimization program which includes automated
exposure control, adjustment of the mA and/or kV according to
patient size and/or use of iterative reconstruction technique.

CONTRAST:  100mL OMNIPAQUE IOHEXOL 300 MG/ML  SOLN
FINDINGS: Lower chest: Increased left lower lobe consolidation.

Hepatobiliary: No focal liver abnormality is seen. The gallbladder
is unremarkable.

Pancreas: Cholelithiasis.

Spleen: Normal size spleen with adjacent splenule.

Adrenals/Urinary Tract: Adrenal glands are unremarkable. No
hydronephrosis or nephrolithiasis. Bladder is unremarkable.

Stomach/Bowel: There is a percutaneous gastrostomy tube in place,
with significant adjacent inflammatory stranding within the abdomen
and along the adjacent subcutaneous tissues. No adjacent free gas.
Adjacent small soft tissue nodule possibly a small reactive lymph
node. There is no evidence of bowel obstruction.The appendix is
normal. Colonic diverticulosis. No diverticulitis.

Vascular/Lymphatic: Aortoiliac atherosclerosis.  No AAA.

Reproductive: Unremarkable.

Other: There are fat containing inguinal hernias. No bowel
containing hernia. No free air. No ascites. No focal fluid
collection.

Musculoskeletal: Chronic left lower rib fractures. Chronic left
superior and inferior pubic rami fractures. There is bilateral
femoral head avascular necrosis without articular surface collapse.
Mild bilateral hip osteoarthritis. Trace degenerative retrolisthesis
at L5-S1. Multilevel degenerative changes of the spine.
IMPRESSION: Focal inflammatory stranding along the percutaneous gastrostomy tube
tract within the anterior upper abdomen. Adjacent skin thickening
and subcutaneous swelling as can be seen in cellulitis. No adjacent
free intraperitoneal gas or focal/drainable fluid collection.

## 2021-07-10 MED ORDER — DIATRIZOATE MEGLUMINE & SODIUM 66-10 % PO SOLN
ORAL | Status: AC
  Filled 2021-07-10: qty 60

## 2021-07-10 MED ORDER — ONDANSETRON HCL 4 MG/2ML IJ SOLN
4.0000 mg | Freq: Once | INTRAMUSCULAR | Status: AC
  Administered 2021-07-10: 4 mg via INTRAVENOUS
  Filled 2021-07-10: qty 2

## 2021-07-10 MED ORDER — CEFAZOLIN SODIUM-DEXTROSE 2-4 GM/100ML-% IV SOLN
2.0000 g | Freq: Once | INTRAVENOUS | Status: AC
  Administered 2021-07-10: 2 g via INTRAVENOUS
  Filled 2021-07-10: qty 100

## 2021-07-10 MED ORDER — SODIUM CHLORIDE 0.9 % IV SOLN: INTRAVENOUS | Status: DC

## 2021-07-10 MED ORDER — IOHEXOL 300 MG/ML  SOLN
100.0000 mL | Freq: Once | INTRAMUSCULAR | Status: AC | PRN
  Administered 2021-07-10: 100 mL via INTRAVENOUS

## 2021-07-10 MED ORDER — HYDROMORPHONE HCL 1 MG/ML IJ SOLN
0.5000 mg | Freq: Once | INTRAMUSCULAR | Status: AC
  Administered 2021-07-10: 0.5 mg via INTRAVENOUS
  Filled 2021-07-10: qty 0.5

## 2021-07-10 NOTE — ED Provider Notes (Signed)
[facility] EMERGENCY DEPARTMENT Provider Note   CSN: [id number] Arrival date & time: 07/10/21  1534     History  Chief Complaint  Patient presents with   PEG Tube Problem    Brent [name] is a 56 y.o. male.  Patient status post PEG tube placed May 18 that [facility].  Patient had a previous PEG tube in place before they removed it but then he needed to have it replaced.  Patient sent here today from [place] home.  For blood pressure of 190/111 oxygen sats were 80% on room air and redness around the PEG tube site concern for infection upon EMS arrival patient reported that he was in no acute distress blood pressure was 124/74.  We got blood pressure 128/87 oxygen sats have been 97%.  Patient without having any respiratory problems patient states that the tube is working properly.      Home Medications Prior to Admission medications   Medication Sig Start Date End Date Taking? Authorizing Provider  acetaminophen (TYLENOL) 160 MG/5ML solution Place 20.3 mLs (650 mg total) into feeding tube every 4 (four) hours as needed for moderate pain or fever. 09/26/20   Ezekiel Slocumb, DO  albuterol (VENTOLIN HFA) 108 (90 Base) MCG/ACT inhaler Inhale 2 puffs into the lungs every 6 (six) hours as needed for wheezing or shortness of breath. 10/17/20   Angiulli, Lavon Paganini, PA-C  ALPRAZolam Duanne Moron) 0.5 MG tablet Take 0.5 mg by mouth 3 (three) times daily as needed. 11/04/20   [provider]  carBAMazepine (TEGRETOL) 100 MG/5ML suspension Place 10 mLs (200 mg total) into feeding tube every 6 (six) hours. 09/26/20   Ezekiel Slocumb, DO  clopidogrel (PLAVIX) 75 MG tablet Place 1 tablet (75 mg total) into feeding tube daily. 09/27/20   Ezekiel Slocumb, DO  Colchicine 0.6 MG CAPS 1 capsule by Feeding Tube route in the morning and at bedtime. 10/26/20   [provider]  escitalopram (LEXAPRO) 20 MG tablet Take 20 mg by mouth daily.    [provider]  fenofibrate  160 MG tablet Take 160 mg by mouth daily. 12/11/16   [provider]  HYDROcodone-acetaminophen (NORCO/VICODIN) 5-325 MG tablet Place 1 tablet into feeding tube daily as needed for moderate pain. 10/17/20   Angiulli, Lavon Paganini, PA-C  insulin detemir (LEVEMIR) 100 UNIT/ML injection Inject 0.06 mLs (6 Units total) into the skin 2 (two) times daily. 10/17/20   Angiulli, Lavon Paganini, PA-C  insulin glargine (LANTUS) 100 UNIT/ML injection Inject 6 Units into the skin 2 (two) times daily. For DM    [provider]  JANUMET 50-1000 MG tablet Take 1 tablet by mouth 2 (two) times daily. 10/03/20   [provider]  lidocaine (LIDODERM) 5 % Place 1 patch onto the skin daily. Remove & Discard patch within 12 hours or as directed by MD 10/18/20   Angiulli, Lavon Paganini, PA-C  nitroGLYCERIN (NITROSTAT) 0.4 MG SL tablet Place 1 tablet (0.4 mg total) under the tongue every 5 (five) minutes as needed for chest pain. 09/26/20   Ezekiel Slocumb, DO  Nutritional Supplements (FEEDING SUPPLEMENT, OSMOLITE 1.5 CAL,) LIQD Place 355 mLs into feeding tube 4 (four) times daily. 10/17/20   Angiulli, Lavon Paganini, PA-C  omeprazole (PRILOSEC) 20 MG capsule 20 mg daily.    [provider]  pantoprazole sodium (PROTONIX) 40 mg PACK Place 20 mLs (40 mg total) into feeding tube 2 (two) times daily. 10/17/20   Angiulli, Lavon Paganini, PA-C  polyethylene glycol (MIRALAX / GLYCOLAX) 17 g packet Place 17 g into feeding tube daily. 09/27/20   Ezekiel Slocumb, DO  propranolol (INDERAL) 10 MG tablet Place 1 tablet (10 mg total) into feeding tube 2 (two) times daily. 10/17/20   Angiulli, Lavon Paganini, PA-C  QUEtiapine (SEROQUEL) 100 MG tablet Place 1 tablet (100 mg total) into feeding tube at bedtime. 10/17/20   Angiulli, Lavon Paganini, PA-C  rosuvastatin (CRESTOR) 20 MG tablet Place 1 tablet (20 mg total) into feeding tube daily. 10/18/20   Angiulli, Lavon Paganini, PA-C  Water For Irrigation, Sterile (FREE WATER) SOLN Place 230 mLs into feeding tube 4  (four) times daily. 10/17/20   Angiulli, Lavon Paganini, PA-C      Allergies    Patient has no known allergies.    Review of Systems   Review of Systems  Constitutional:  Negative for chills and fever.  HENT:  Negative for ear pain and sore throat.   Eyes:  Negative for pain and visual disturbance.  Respiratory:  Negative for cough and shortness of breath.   Cardiovascular:  Negative for chest pain and palpitations.  Gastrointestinal:  Negative for abdominal pain and vomiting.  Genitourinary:  Negative for dysuria and hematuria.  Musculoskeletal:  Negative for arthralgias and back pain.  Skin:  Negative for color change and rash.  Neurological:  Negative for seizures and syncope.  All other systems reviewed and are negative.  Physical Exam Updated Vital Signs BP 124/78   Pulse 88   Temp 99.4 F (37.4 C) (Oral)   Resp 19   Ht 1.829 m (6')   Wt 93 kg   SpO2 97%   BMI 27.80 kg/m  Physical Exam Vitals and nursing note reviewed.  Constitutional:      General: He is not in acute distress.    Appearance: Normal appearance. He is well-developed.  HENT:     Head: Normocephalic and atraumatic.  Eyes:     Extraocular Movements: Extraocular movements intact.     Conjunctiva/sclera: Conjunctivae normal.     Pupils: Pupils are equal, round, and reactive to light.  Cardiovascular:     Rate and Rhythm: Normal rate and regular rhythm.     Heart sounds: No murmur heard. Pulmonary:     Effort: Pulmonary effort is normal. No respiratory distress.     Breath sounds: Normal breath sounds.  Abdominal:     Palpations: Abdomen is soft.     Tenderness: There is abdominal tenderness.     Comments: Erythema around the PEG tube area measuring probably a total of about 8 cm.  Possibly some mild tenderness.  Certainly some increased warmth.  Musculoskeletal:        General: No swelling.     Cervical back: Normal range of motion and neck supple.  Skin:    General: Skin is warm and dry.      Capillary Refill: Capillary refill takes less than 2 seconds.  Neurological:     General: No focal deficit present.     Mental Status: He is alert and oriented to person, place, and time.     Cranial Nerves: No cranial nerve deficit.     Sensory: No sensory deficit.     Motor: No weakness.  Psychiatric:        Mood and Affect: Mood normal.    ED Results / Procedures / Treatments   Labs (all labs ordered are listed, but only abnormal results are displayed) Labs Reviewed  CBC WITH DIFFERENTIAL/PLATELET -  Abnormal; Notable for the following components:      Result Value   RBC 4.21 (*)    Hemoglobin 12.1 (*)    HCT 36.8 (*)    Neutro Abs 8.7 (*)    Lymphs Abs 0.5 (*)    All other components within normal limits  COMPREHENSIVE METABOLIC PANEL - Abnormal; Notable for the following components:   Glucose, Bld 232 (*)    Albumin 3.4 (*)    AST 13 (*)    All other components within normal limits  CULTURE, BLOOD (ROUTINE X 2)  CULTURE, BLOOD (ROUTINE X 2)  LACTIC ACID, PLASMA  LACTIC ACID, PLASMA    EKG None  Radiology CT Abdomen Pelvis W Contrast  Result Date: 07/10/2021 CLINICAL DATA:  Abdominal pain, acute, nonlocalized Pretty significant abdominal cellulitis around the PEG tube site. Patient with low-grade fever EXAM: CT ABDOMEN AND PELVIS WITH CONTRAST TECHNIQUE: Multidetector CT imaging of the abdomen and pelvis was performed using the standard protocol following bolus administration of intravenous contrast. RADIATION DOSE REDUCTION: This exam was performed according to the departmental dose-optimization program which includes automated exposure control, adjustment of the mA and/or kV according to patient size and/or use of iterative reconstruction technique. CONTRAST:  125mL OMNIPAQUE IOHEXOL 300 MG/ML  SOLN COMPARISON:  None Available. FINDINGS: Lower chest: Increased left lower lobe consolidation. Hepatobiliary: No focal liver abnormality is seen. The gallbladder is  unremarkable. Pancreas: Cholelithiasis. Spleen: Normal size spleen with adjacent splenule. Adrenals/Urinary Tract: Adrenal glands are unremarkable. No hydronephrosis or nephrolithiasis. Bladder is unremarkable. Stomach/Bowel: There is a percutaneous gastrostomy tube in place, with significant adjacent inflammatory stranding within the abdomen and along the adjacent subcutaneous tissues. No adjacent free gas. Adjacent small soft tissue nodule possibly a small reactive lymph node. There is no evidence of bowel obstruction.The appendix is normal. Colonic diverticulosis. No diverticulitis. Vascular/Lymphatic: Aortoiliac atherosclerosis.  No AAA. Reproductive: Unremarkable. Other: There are fat containing inguinal hernias. No bowel containing hernia. No free air. No ascites. No focal fluid collection. Musculoskeletal: Chronic left lower rib fractures. Chronic left superior and inferior pubic rami fractures. There is bilateral femoral head avascular necrosis without articular surface collapse. Mild bilateral hip osteoarthritis. Trace degenerative retrolisthesis at L5-S1. Multilevel degenerative changes of the spine. IMPRESSION: Focal inflammatory stranding along the percutaneous gastrostomy tube tract within the anterior upper abdomen. Adjacent skin thickening and subcutaneous swelling as can be seen in cellulitis. No adjacent free intraperitoneal gas or focal/drainable fluid collection. Electronically Signed   By: Maurine Simmering M.D.   On: 07/10/2021 20:28   DG ABDOMEN PEG TUBE LOCATION  Result Date: 07/10/2021 CLINICAL DATA:  Abdominal pain EXAM: ABDOMEN - 1 VIEW COMPARISON:  10/13/2020 FINDINGS: Contrast was injected through the gastrostomy tube. This is seen within the stomach and duodenum. Possible contrast seen adjacent to the greater curvature of the stomach, possibly within the abdominal wall although this is difficult to confirm. No bowel obstruction. No organomegaly or free air. IMPRESSION: Injection of  contrast through the gastrostomy tube which is predominantly in the stomach and duodenum. There may be a small amount of extraluminal contrast adjacent to the gastric button and greater curvature of the stomach, possibly within the anterior abdominal wall although difficult to confirm on this single image. If felt indicated, noncontrast CT may be helpful. Electronically Signed   By: Rolm Baptise M.D.   On: 07/10/2021 21:40    Procedures Procedures    Medications Ordered in ED Medications  0.9 %  sodium chloride infusion ( Intravenous New  Bag/Given 07/10/21 1943)  HYDROmorphone (DILAUDID) injection 0.5 mg (0.5 mg Intravenous Given 07/10/21 1940)  ondansetron (ZOFRAN) injection 4 mg (4 mg Intravenous Given 07/10/21 1940)  ceFAZolin (ANCEF) IVPB 2g/100 mL premix (0 g Intravenous Stopped 07/10/21 2126)  iohexol (OMNIPAQUE) 300 MG/ML solution 100 mL (100 mLs Intravenous Contrast Given 07/10/21 1956)  diatrizoate meglumine-sodium (GASTROGRAFIN) 66-10 % solution (  Given by Other 07/10/21 2126)    ED Course/ Medical Decision Making/ A&P                           Medical Decision Making Amount and/or Complexity of Data Reviewed Labs: ordered. Radiology: ordered.  Risk Prescription drug management.   Patient clinically seems to have cellulitis around the PEG tube.  Patient's vital sign parameters temp of 99.4 not consistent with sepsis.  Patient's lactic acid was very normal at 0.8.  Blood cultures were done.  No leukocytosis white count 10.4 hemoglobin 12.1.  Complete metabolic panel without significant abnormalities no anion gap.  Patient still initial lactic acid was 1.2 but it came down patient did receive some fluids.  Did a Gastrografin study of the PEG tube.  And everything seemed to be working properly.  Also did CT scan of the abdomen which showed focal inflammation and inflammatory stranding along the percutaneous gastric tube track within the anterior upper abdomen adjacent skin thickening  and subcutaneous swelling as can be seen in with cellulitis.  No adjacent free intraperitoneal gas or focal or drainable fluid collection.  Based on this patient treated for cellulitis.  And received Ancef.  Patient I think he can return back to [place] home and they can continue I recommend 1 g of Ancef every 8 hours for 2 days.  And then follow back up with the surgeon that did the procedure.   Final Clinical Impression(s) / ED Diagnoses Final diagnoses:  Abdominal wall cellulitis    Rx / DC Orders ED Discharge Orders     None         Fredia Sorrow, MD 07/11/21 [id number]

## 2021-07-10 NOTE — ED Notes (Signed)
Patient transported to CT 

## 2021-07-10 NOTE — ED Triage Notes (Signed)
Pt brought in by [facility] from [facility]. Nursing home staff reported to EMS that pt's BP was 190/111, O2 sat was 80% on RA and redness around pt's PEG tube site with concern for infection. Upon EMS arrival, they report pt was in NAD, BP 124/74, O2 sat 97% RA. PEG tube was placed 2 days ago. The only concern pt has is that he needs his pain medication for his stomach because he didn't receive it before leaving the facility.

## 2021-07-10 NOTE — ED Notes (Signed)
Placed pt on 2l [place] due to spo2 reading 85% on room air

## 2021-07-10 NOTE — ED Notes (Signed)
Pt alert, NAD, calm, interactive, no respiratory distress, speaking clearly, here for high BP, denies sx, endorses hunger, states, "they were getting ready to feed me".

## 2021-07-11 MED ORDER — ONDANSETRON HCL 4 MG/2ML IJ SOLN
4.0000 mg | Freq: Once | INTRAMUSCULAR | Status: AC
  Administered 2021-07-11: 4 mg via INTRAVENOUS
  Filled 2021-07-11: qty 2

## 2021-07-11 MED ORDER — MORPHINE SULFATE (PF) 4 MG/ML IV SOLN
4.0000 mg | Freq: Once | INTRAVENOUS | Status: AC
  Administered 2021-07-11: 4 mg via INTRAVENOUS
  Filled 2021-07-11: qty 1

## 2021-07-11 MED ORDER — MORPHINE SULFATE (PF) 4 MG/ML IV SOLN
4.0000 mg | Freq: Once | INTRAVENOUS | Status: AC
Start: 2021-07-11 — End: 2021-07-11
  Administered 2021-07-11: 4 mg via INTRAVENOUS
  Filled 2021-07-11: qty 1

## 2021-07-11 NOTE — Discharge Instructions (Addendum)
CT suggestive of abdominal wall cellulitis and clinically that seems to fit.  Patient without any evidence of deep space infection on CT scan.  Also peg tube was studied with Gastrografin and is functioning properly.  Patient given IV Ancef here 1 g would recommend continuing either IV Ancef for oral Keflex for the abdominal wall cellulitis.  I would recommend at least continuing the IV Ancef for the next couple days.  The Ancef would normally be 1 g every 8 hours.  Return for any new or worse symptoms.  Patient without evidence of any septic parameters here tonight.

## 2021-07-11 NOTE — ED Notes (Signed)
Attempted report to [facility] x3 with no answer.

## 2021-07-15 LAB — CULTURE, BLOOD (ROUTINE X 2)
Culture: NO GROWTH
Culture: NO GROWTH
Special Requests: ADEQUATE
Special Requests: ADEQUATE

## 2021-07-23 ENCOUNTER — Encounter: Payer: Medicaid Other | Attending: Physical Medicine & Rehabilitation | Admitting: Physical Medicine & Rehabilitation

## 2021-07-23 ENCOUNTER — Encounter: Payer: Self-pay | Admitting: Physical Medicine & Rehabilitation

## 2021-07-23 ENCOUNTER — Encounter: Payer: Self-pay | Admitting: *Deleted

## 2021-07-23 VITALS — BP 119/76 | HR 72 | Ht 72.0 in

## 2021-07-23 DIAGNOSIS — M7502 Adhesive capsulitis of left shoulder: Secondary | ICD-10-CM | POA: Diagnosis present

## 2021-07-23 DIAGNOSIS — F411 Generalized anxiety disorder: Secondary | ICD-10-CM | POA: Diagnosis present

## 2021-07-23 DIAGNOSIS — I639 Cerebral infarction, unspecified: Secondary | ICD-10-CM | POA: Insufficient documentation

## 2021-07-23 NOTE — Progress Notes (Signed)
Subjective:    Patient ID: Brent [name], male    DOB: Feb 24, 1965, 56 y.o.   MRN: [id number]  HPI  Brent [name] is here in follow up of his right ACA infarct.  At last visit we injected his left shoulder which helped. Therapy has worked on his left shoulder ROM and strength.   He is still struggling with weakness in the left leg. Therapy is working on pregait activities. He is having pain at the left knee as well. It doesn't appear that he is still taking baclofen.   His mood has been much more positive. He sleeps well.  He takes seroquel at night and lexapro during the day. He uses xanax prn daily or bid prn. He is focused on getting home eventually and seeing his "grand babies"   Pain Inventory Average Pain 9 Pain Right Now 9 My pain is constant and aching  LOCATION OF PAIN  left arm  BOWEL Number of stools per week: 3-4   BLADDER Normal    Mobility use a wheelchair needs help with transfers  Function disabled: date disabled . I need assistance with the following:  dressing  Neuro/Psych weakness trouble walking  Prior Studies Any changes since last visit?  no  Physicians involved in your care Any changes since last visit?  no   Family History  Problem Relation Age of Onset   Heart attack Father    COPD Father    Coronary artery disease Mother    Social History   Socioeconomic History   Marital status: Single    Spouse name: Not on file   Number of children: Not on file   Years of education: Not on file   Highest education level: Not on file  Occupational History   Not on file  Tobacco Use   Smoking status: Every Day    Packs/day: 0.25    Years: 30.00    Pack years: 7.50    Types: Cigarettes   Smokeless tobacco: Never  Vaping Use   Vaping Use: Never used  Substance and Sexual Activity   Alcohol use: Not Currently    Comment: drinks beer on the weekends   Drug use: Not Currently    Types: Cocaine   Sexual activity: Not Currently   Other Topics Concern   Not on file  Social History Narrative   Lives in [place] with wife.   Has one daughter and one grandson.   Right handed    Social Determinants of Health   Financial Resource Strain: Not on file  Food Insecurity: Not on file  Transportation Needs: Not on file  Physical Activity: Not on file  Stress: Not on file  Social Connections: Not on file   Past Surgical History:  Procedure Laterality Date   BIOPSY  10/02/2020   Procedure: BIOPSY;  Surgeon: Jenel Lucksunningham, Scott E, MD;  [facility];  Service: Gastroenterology;;   CARDIAC CATHETERIZATION  10/02/2010    Nonobstructive mild coronary plaque   ESOPHAGOGASTRODUODENOSCOPY N/A 10/02/2020   Procedure: ESOPHAGOGASTRODUODENOSCOPY (EGD);  Surgeon: Jenel Lucksunningham, Scott E, MD;  [facility];  Service: Gastroenterology;  Laterality: N/A;   IR CM INJ ANY COLONIC TUBE W/FLUORO  12/04/2020   IR GASTROSTOMY TUBE MOD SED  09/24/2020   IR GASTROSTOMY TUBE REMOVAL  04/21/2021   KNEE SURGERY Bilateral 2018   Repair   PERFORATED VISCUS SURGERY     MULTIPLE FRACTURES   Past Medical History:  Diagnosis Date   Anxiety    Aplastic anemia,  unspecified ([place])    Bipolar disorder ([place])    Chest pain, unspecified    Chronic airway obstruction, not elsewhere classified    Cocaine substance abuse ([place])    Dysphagia    Hyperlipidemia    Neuropathy    Other and unspecified hyperlipidemia    Shortness of breath    Stroke ([place]) 06/17/2020   Tobacco use disorder    Type II or unspecified type diabetes mellitus without mention of complication, not stated as uncontrolled    Unspecified epilepsy without mention of intractable epilepsy    Unspecified essential hypertension    BP 119/76   Pulse 72   Ht 6' (1.829 m)   SpO2 92%   BMI 27.80 kg/m   Opioid Risk Score:   Fall Risk Score:  `1  Depression screen [facility] 2/9     07/23/2021   10:19 AM 04/16/2021   10:31 AM 01/15/2021   11:43 AM  Depression screen PHQ 2/9   Decreased Interest 0 0 0  Down, Depressed, Hopeless 0 0 0  PHQ - 2 Score 0 0 0  Altered sleeping   0  Tired, decreased energy   0  Change in appetite   0  Feeling bad or failure about yourself    0  Trouble concentrating   0  Moving slowly or fidgety/restless   0  Suicidal thoughts   0  PHQ-9 Score   0     Review of Systems  Constitutional: Negative.   HENT: Negative.    Eyes: Negative.   Respiratory: Negative.    Cardiovascular: Negative.   Gastrointestinal: Negative.   Endocrine: Negative.   Genitourinary: Negative.   Musculoskeletal:  Positive for gait problem.  Skin: Negative.   Allergic/Immunologic: Negative.   Neurological:  Positive for weakness.  Hematological: Negative.   Psychiatric/Behavioral: Negative.        Objective:   Physical Exam  General: No acute distress HEENT: NCAT, EOMI, oral membranes moist Cards: reg rate  Chest: normal effort Abdomen: Soft, NT, ND Skin: dry, intact Extremities: no edema Psych: pleasant and appropriate  Skin: intact Neuro: Patient is alert and oriented to self and place but not date.  Mild left central 7 persists.  Left upper extremity grossly 3 to 3+/5.   H   Left lower extremity is grossly 2-/5 prox to distal. LUE tone 1+ biceps and 2-3/4 hamstrings.   fair sense of light touch in both left arm and leg Musculoskeletal: left shoulder PROM to 90 degrees with decreased pain.  Left hamstring tendons still tight as well -20 degrees with pain           Assessment & Plan:  1.  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  Left-sided spasticity 3.  Adhesive capsulitis left rotator cuff syndrome--improving 4.  Chronic low back pain 5.  Severe anxiety with depression and history of suicidal ideations  -in a better place     Plan: 1.  continue with daily stretches for shoulder. Needs to work on knee as  well.  2.  Resume baclofen and titrate up to 79mt qid 3.continue pre-gait acitivities with therapy               4.  Anxiety with depression: Patient has demonstrated some improvement here.  Continue current regimen.     Fifteen minutes of face to face patient care time were spent during  this visit. All questions were encouraged and answered.  Follow up with me in 4 mos .

## 2021-07-23 NOTE — Patient Instructions (Signed)
RESUME BACLOFEN (MUSCLE RELAXANT) : 10MG  TWICE DAILY FOR 4 DAYS THEN 3X DAILY FOR 7 DAYS THEN UP TO 4 X DAILY THEREAFTER.   HOLD IF IT MAKES YOU SLEEPY

## 2021-08-26 ENCOUNTER — Emergency Department (HOSPITAL_COMMUNITY)
Admission: EM | Admit: 2021-08-26 | Discharge: 2021-08-27 | Disposition: A | Discharge status: Skilled Nursing Facility | Payer: Medicaid Other | Attending: Student | Admitting: Student

## 2021-08-26 ENCOUNTER — Encounter (HOSPITAL_COMMUNITY): Payer: Self-pay | Admitting: *Deleted

## 2021-08-26 ENCOUNTER — Emergency Department (HOSPITAL_COMMUNITY): Payer: Medicaid Other

## 2021-08-26 ENCOUNTER — Other Ambulatory Visit: Payer: Self-pay

## 2021-08-26 DIAGNOSIS — Z7901 Long term (current) use of anticoagulants: Secondary | ICD-10-CM | POA: Diagnosis not present

## 2021-08-26 DIAGNOSIS — Z4659 Encounter for fitting and adjustment of other gastrointestinal appliance and device: Secondary | ICD-10-CM | POA: Insufficient documentation

## 2021-08-26 DIAGNOSIS — J441 Chronic obstructive pulmonary disease with (acute) exacerbation: Secondary | ICD-10-CM | POA: Insufficient documentation

## 2021-08-26 DIAGNOSIS — Z794 Long term (current) use of insulin: Secondary | ICD-10-CM | POA: Insufficient documentation

## 2021-08-26 DIAGNOSIS — I10 Essential (primary) hypertension: Secondary | ICD-10-CM | POA: Diagnosis not present

## 2021-08-26 DIAGNOSIS — K9423 Gastrostomy malfunction: Secondary | ICD-10-CM | POA: Insufficient documentation

## 2021-08-26 DIAGNOSIS — E119 Type 2 diabetes mellitus without complications: Secondary | ICD-10-CM | POA: Diagnosis not present

## 2021-08-26 DIAGNOSIS — F17210 Nicotine dependence, cigarettes, uncomplicated: Secondary | ICD-10-CM | POA: Diagnosis not present

## 2021-08-26 DIAGNOSIS — Z7902 Long term (current) use of antithrombotics/antiplatelets: Secondary | ICD-10-CM | POA: Diagnosis not present

## 2021-08-26 DIAGNOSIS — T85528A Displacement of other gastrointestinal prosthetic devices, implants and grafts, initial encounter: Secondary | ICD-10-CM

## 2021-08-26 MED ORDER — ZIPRASIDONE MESYLATE 20 MG IM SOLR
20.0000 mg | Freq: Once | INTRAMUSCULAR | Status: AC
Start: 2021-08-26 — End: 2021-08-26
  Administered 2021-08-26: 20 mg via INTRAMUSCULAR
  Filled 2021-08-26: qty 20

## 2021-08-26 NOTE — ED Triage Notes (Signed)
Pt brought in by [facility] from [facility] with c/o feeding tube being pulled out about 3 hours while transferring from bed to wheelchair by nursing staff. EMS reports feeding tube was a size 24Fr.

## 2021-08-26 NOTE — ED Notes (Signed)
Notified [facility] of patient needing transportation back to [facility] facility.

## 2021-08-26 NOTE — ED Provider Notes (Signed)
[facility] EMERGENCY DEPARTMENT Provider Note  CSN: [id number] Arrival date & time: 08/26/21 1818  Chief Complaint(s) Feeding Tube Removed  HPI Brent [name] is a 56 y.o. male who presents emergency department for evaluation of an unintentional G-tube removal.  Patient states facility staff accidentally removed his G-tube.  He denies chest pain, shortness of breath, abdominal pain, nausea, vomiting or other systemic symptoms.   Past Medical History Past Medical History:  Diagnosis Date   Anxiety    Aplastic anemia, unspecified (HCC)    Bipolar disorder (HCC)    Chest pain, unspecified    Chronic airway obstruction, not elsewhere classified    Cocaine substance abuse (HCC)    Dysphagia    Hyperlipidemia    Neuropathy    Other and unspecified hyperlipidemia    Shortness of breath    Stroke (HCC) 06/17/2020   Tobacco use disorder    Type II or unspecified type diabetes mellitus without mention of complication, not stated as uncontrolled    Unspecified epilepsy without mention of intractable epilepsy    Unspecified essential hypertension    Patient Active Problem List   Diagnosis Date Noted   Adhesive capsulitis of left shoulder 04/16/2021   History of CVA (cerebrovascular accident) 01/15/2021   Dysphagia, post-stroke    Adjustment disorder with mixed anxiety and depressed mood    Acute blood loss anemia 10/02/2020   PEG (percutaneous endoscopic gastrostomy) status (HCC)    Malnutrition of moderate degree 09/30/2020   S/P percutaneous endoscopic gastrostomy (PEG) tube placement (HCC)    Labile blood glucose    Leukocytosis    Goals of care, counseling/discussion 09/12/2020   Acute stroke due to ischemia (HCC) 09/11/2020   Acute respiratory failure with hypoxia (HCC) 09/11/2020   Aspiration pneumonitis (HCC) 09/11/2020   Dysarthria 09/11/2020   Sepsis due to undetermined organism (HCC) 09/11/2020   Cocaine abuse (HCC)    Acute CVA (cerebrovascular accident) (HCC)  07/25/2020   Acute on chronic respiratory failure with hypoxia (HCC) 11/04/2019   Rhinovirus infection 11/04/2019   Essential hypertension 11/04/2019   CAP (community acquired pneumonia) 11/03/2019   Diabetic polyneuropathy associated with diabetes mellitus due to underlying condition (HCC) 02/06/2019   Localization-related (focal) (partial) symptomatic epilepsy and epileptic syndromes with complex partial seizures, intractable, without status epilepticus (HCC) 02/06/2019   Mixed diabetic hyperlipidemia associated with type 2 diabetes mellitus (HCC) 11/26/2018   Cough 11/03/2018   Obstructive sleep apnea 11/03/2018   Dysphagia-----s/p Prior Uvulectomy and Now with Acute CVA 09/27/2018   Eczema 09/27/2018   Right leg weakness 01/19/2018   History of arthroscopy of knee 11/15/2017   Complex tear of lateral meniscus of right knee as current injury 10/27/2017   Effusion of right knee 07/26/2017   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   Cellulitis 07/22/2017   Primary osteoarthritis involving multiple joints 06/22/2017   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 02/01/2017   Right knee pain 02/01/2017   Acute bronchitis 12/07/2016   Acute idiopathic gout of left foot 12/07/2016   Anxiety, generalized 12/07/2016   COPD with acute exacerbation (HCC) 12/07/2016   Seizures (HCC) 12/07/2016   CAD (coronary artery disease) 12/19/2010   Uncontrolled type 2 diabetes mellitus, with long-term current use of insulin 12/19/2010   Hypertension associated with diabetes (HCC) 12/19/2010   GERD (gastroesophageal reflux disease) 12/19/2010   Hypertriglyceridemia 12/19/2010   Tobacco abuse 12/19/2010   Home Medication(s) Prior to Admission medications   Medication Sig Start Date End Date  Taking? Authorizing Provider  acetaminophen (TYLENOL) 160 MG/5ML solution Place 20.3 mLs (650 mg total) into feeding tube every 4 (four) hours as needed for moderate pain or fever.  09/26/20   Ezekiel Slocumb, DO  albuterol (VENTOLIN HFA) 108 (90 Base) MCG/ACT inhaler Inhale 2 puffs into the lungs every 6 (six) hours as needed for wheezing or shortness of breath. 10/17/20   Angiulli, Lavon Paganini, PA-C  ALPRAZolam Duanne Moron) 0.5 MG tablet Take 0.5 mg by mouth 3 (three) times daily as needed. 11/04/20   [provider]  carBAMazepine (TEGRETOL) 100 MG/5ML suspension Place 10 mLs (200 mg total) into feeding tube every 6 (six) hours. 09/26/20   Ezekiel Slocumb, DO  clopidogrel (PLAVIX) 75 MG tablet Place 1 tablet (75 mg total) into feeding tube daily. 09/27/20   Ezekiel Slocumb, DO  Colchicine 0.6 MG CAPS 1 capsule by Feeding Tube route in the morning and at bedtime. 10/26/20   [provider]  escitalopram (LEXAPRO) 20 MG tablet Take 20 mg by mouth daily.    [provider]  fenofibrate 160 MG tablet Take 160 mg by mouth daily. 12/11/16   [provider]  insulin detemir (LEVEMIR) 100 UNIT/ML injection Inject 0.06 mLs (6 Units total) into the skin 2 (two) times daily. 10/17/20   Angiulli, Lavon Paganini, PA-C  insulin glargine (LANTUS) 100 UNIT/ML injection Inject 6 Units into the skin 2 (two) times daily. For DM    [provider]  JANUMET 50-1000 MG tablet Take 1 tablet by mouth 2 (two) times daily. 10/03/20   [provider]  lidocaine (LIDODERM) 5 % Place 1 patch onto the skin daily. Remove & Discard patch within 12 hours or as directed by MD 10/18/20   Angiulli, Lavon Paganini, PA-C  nitroGLYCERIN (NITROSTAT) 0.4 MG SL tablet Place 1 tablet (0.4 mg total) under the tongue every 5 (five) minutes as needed for chest pain. 09/26/20   Ezekiel Slocumb, DO  Nutritional Supplements (FEEDING SUPPLEMENT, OSMOLITE 1.5 CAL,) LIQD Place 355 mLs into feeding tube 4 (four) times daily. 10/17/20   Angiulli, Lavon Paganini, PA-C  omeprazole (PRILOSEC) 20 MG capsule 20 mg daily.    [provider]  pantoprazole sodium (PROTONIX) 40 mg PACK Place 20 mLs (40 mg  total) into feeding tube 2 (two) times daily. 10/17/20   Angiulli, Lavon Paganini, PA-C  polyethylene glycol (MIRALAX / GLYCOLAX) 17 g packet Place 17 g into feeding tube daily. 09/27/20   Ezekiel Slocumb, DO  propranolol (INDERAL) 10 MG tablet Place 1 tablet (10 mg total) into feeding tube 2 (two) times daily. 10/17/20   Angiulli, Lavon Paganini, PA-C  QUEtiapine (SEROQUEL) 100 MG tablet Place 1 tablet (100 mg total) into feeding tube at bedtime. 10/17/20   Angiulli, Lavon Paganini, PA-C  rosuvastatin (CRESTOR) 20 MG tablet Place 1 tablet (20 mg total) into feeding tube daily. 10/18/20   Angiulli, Lavon Paganini, PA-C  Water For Irrigation, Sterile (FREE WATER) SOLN Place 230 mLs into feeding tube 4 (four) times daily. 10/17/20   Angiulli, Lavon Paganini, PA-C  Past Surgical History Past Surgical History:  Procedure Laterality Date   BIOPSY  10/02/2020   Procedure: BIOPSY;  Surgeon: Daryel November, MD;  Location: [facility] ENDOSCOPY;  Service: Gastroenterology;;   CARDIAC CATHETERIZATION  10/02/2010    Nonobstructive mild coronary plaque   ESOPHAGOGASTRODUODENOSCOPY N/A 10/02/2020   Procedure: ESOPHAGOGASTRODUODENOSCOPY (EGD);  Surgeon: Daryel November, MD;  Location: [place];  Service: Gastroenterology;  Laterality: N/A;   IR CM INJ ANY COLONIC TUBE W/FLUORO  12/04/2020   IR GASTROSTOMY TUBE MOD SED  09/24/2020   IR GASTROSTOMY TUBE REMOVAL  04/21/2021   KNEE SURGERY Bilateral 2018   Repair   PERFORATED VISCUS SURGERY     MULTIPLE FRACTURES   Family History Family History  Problem Relation Age of Onset   Heart attack Father    COPD Father    Coronary artery disease Mother     Social History Social History   Tobacco Use   Smoking status: Every Day    Packs/day: 0.25    Years: 30.00    Total pack years: 7.50    Types: Cigarettes   Smokeless tobacco: Never  Vaping Use   Vaping Use:  Never used  Substance Use Topics   Alcohol use: Not Currently    Comment: drinks beer on the weekends   Drug use: Not Currently    Types: Cocaine   Allergies Patient has no known allergies.  Review of Systems Review of Systems  All other systems reviewed and are negative.   Physical Exam Vital Signs  I have reviewed the triage vital signs BP 128/86 (BP Location: Right Arm)   Pulse 72   Temp 97.9 F (36.6 C) (Oral)   Resp 14   Ht 6\' 1"  (1.854 m)   Wt 93 kg   SpO2 94%   BMI 27.05 kg/m   Physical Exam Constitutional:      General: He is not in acute distress.    Appearance: Normal appearance.  HENT:     Head: Normocephalic and atraumatic.     Nose: No congestion or rhinorrhea.  Eyes:     General:        Right eye: No discharge.        Left eye: No discharge.     Extraocular Movements: Extraocular movements intact.     Pupils: Pupils are equal, round, and reactive to light.  Cardiovascular:     Rate and Rhythm: Normal rate and regular rhythm.     Heart sounds: No murmur heard. Pulmonary:     Effort: No respiratory distress.     Breath sounds: No wheezing or rales.  Abdominal:     General: There is no distension.     Tenderness: There is no abdominal tenderness.     Comments: G-tube site clean dry and intact with no evidence of purulence or erythema  Musculoskeletal:        General: Normal range of motion.     Cervical back: Normal range of motion.  Skin:    General: Skin is warm and dry.  Neurological:     General: No focal deficit present.     Mental Status: He is alert.     ED Results and Treatments Labs (all labs ordered are listed, but only abnormal results are displayed) Labs Reviewed - No data to display  Radiology No results found.  Pertinent labs & imaging results that were available during my care of the patient were reviewed  by me and considered in my medical decision making (see MDM for details).  Medications Ordered in ED Medications - No data to display                                                                                                                                   Procedures Gastrostomy tube replacement  Date/Time: 08/26/2021 11:58 PM  Performed by: Glendora Score, MD Authorized by: Glendora Score, MD  Consent: Verbal consent obtained. Consent given by: patient Patient understanding: patient states understanding of the procedure being performed Preparation: Patient was prepped and draped in the usual sterile fashion. Local anesthesia used: no  Anesthesia: Local anesthesia used: no  Sedation: Patient sedated: no  Patient tolerance: patient tolerated the procedure well with no immediate complications Comments: 16 French G-tube placed     (including critical care time)  Medical Decision Making / ED Course   This patient presents to the ED for concern of G-tube dislodgment, this involves an extensive number of treatment options, and is a complaint that carries with it a high risk of complications and morbidity.  The differential diagnosis includes G-tube dislodgment, hematoma, G-tube site infection  MDM: Patient seen emergency room for evaluation of an accidental G-tube dislodgment.  Physical exam with the G-tube site that is clean dry and intact with no evidence of infection.  G-tube replaced at bedside but had to be size down to an 66 [place] from his home 46 [place].  X-ray G-tube study is initially obtained and the tube appears to be in appropriate position but oral contrast was not used to confirm position and thus this x-ray was reordered that showed the tube with no contrast leak and is in appropriate position.  Patient then discharged back to his facility   Additional history obtained:  -External records from outside source obtained and reviewed including: Chart review  including previous notes, labs, imaging, consultation notes   Lab Tests: -I ordered, reviewed, and interpreted labs.   The pertinent results include:   Labs Reviewed - No data to display      Imaging Studies ordered: I ordered imaging studies including x-ray G-tube study I independently visualized and interpreted imaging. I agree with the radiologist interpretation   Medicines ordered and prescription drug management: No orders of the defined types were placed in this encounter.   -I have reviewed the patients home medicines and have made adjustments as needed  Critical interventions none  Cardiac Monitoring: The patient was maintained on a cardiac monitor.  I personally viewed and interpreted the cardiac monitored which showed an underlying rhythm of: NSR  Social Determinants of Health:  Factors impacting patients care include: none   Reevaluation: After the interventions noted above, I reevaluated the patient and found that they have :improved  Co morbidities that  complicate the patient evaluation  Past Medical History:  Diagnosis Date   Anxiety    Aplastic anemia, unspecified ([place])    Bipolar disorder (HCC)    Chest pain, unspecified    Chronic airway obstruction, not elsewhere classified    Cocaine substance abuse ([place])    Dysphagia    Hyperlipidemia    Neuropathy    Other and unspecified hyperlipidemia    Shortness of breath    Stroke ([place]) 06/17/2020   Tobacco use disorder    Type II or unspecified type diabetes mellitus without mention of complication, not stated as uncontrolled    Unspecified epilepsy without mention of intractable epilepsy    Unspecified essential hypertension       Dispostion: I considered admission for this patient, but patient does not meet inpatient criteria for admission is safe for discharge with outpatient follow-up     Final Clinical Impression(s) / ED Diagnoses Final diagnoses:  None     @PCDICTATION @     Teressa Lower, MD 08/27/21 1559

## 2021-08-27 ENCOUNTER — Encounter (HOSPITAL_COMMUNITY): Payer: Self-pay | Admitting: Emergency Medicine

## 2021-08-27 ENCOUNTER — Emergency Department (HOSPITAL_COMMUNITY): Payer: Medicaid Other

## 2021-08-27 ENCOUNTER — Emergency Department (HOSPITAL_COMMUNITY)
Admission: EM | Admit: 2021-08-27 | Discharge: 2021-08-28 | Disposition: A | Discharge status: Skilled Nursing Facility | Payer: Medicaid Other | Source: Home / Self Care | Attending: Emergency Medicine | Admitting: Emergency Medicine

## 2021-08-27 DIAGNOSIS — Z7902 Long term (current) use of antithrombotics/antiplatelets: Secondary | ICD-10-CM | POA: Insufficient documentation

## 2021-08-27 DIAGNOSIS — K9423 Gastrostomy malfunction: Secondary | ICD-10-CM | POA: Insufficient documentation

## 2021-08-27 DIAGNOSIS — Z794 Long term (current) use of insulin: Secondary | ICD-10-CM | POA: Insufficient documentation

## 2021-08-27 DIAGNOSIS — Z7982 Long term (current) use of aspirin: Secondary | ICD-10-CM | POA: Insufficient documentation

## 2021-08-27 MED ORDER — IOHEXOL 300 MG/ML  SOLN: 30.0000 mL | Freq: Once | INTRAMUSCULAR | Status: DC | PRN

## 2021-08-27 NOTE — ED Provider Notes (Signed)
[facility] EMERGENCY DEPARTMENT Provider Note   CSN: [id number] Arrival date & time: 08/27/21  1908     History  Chief Complaint  Patient presents with  . PEG Tube Displacement    Brent [name] is a 56 y.o. male.  Patient is a 56 year old male who was seen yesterday after his PEG tube fell out by Dr. Audrie Lia emergency department.  PEG tube was replaced with abdominal x-ray demonstrating proper placement.  Patient was discharged to nursing care and sent back today for concerns of after radiology report posted that the study was not completed with contrast.  Nursing home sent patient back for contrasted study.  The history is provided by the patient. No language interpreter was used.       Home Medications Prior to Admission medications   Medication Sig Start Date End Date Taking? Authorizing Provider  albuterol (VENTOLIN HFA) 108 (90 Base) MCG/ACT inhaler Inhale 2 puffs into the lungs every 6 (six) hours as needed for wheezing or shortness of breath. 10/17/20  Yes Angiulli, Mcarthur Rossetti, PA-C  ALPRAZolam Prudy Feeler) 0.5 MG tablet Take 0.5 mg by mouth 3 (three) times daily as needed. 11/04/20  Yes [provider]  ascorbic acid (VITAMIN C) 500 MG tablet Take 500 mg by mouth 2 (two) times daily.   Yes [provider]  aspirin EC 81 MG tablet Take 1 tablet by mouth daily. Feeding tube 07/05/21  Yes [provider]  baclofen (LIORESAL) 10 MG tablet Take 10 mg by mouth 4 (four) times daily.   Yes [provider]  carBAMazepine (TEGRETOL) 100 MG/5ML suspension Place 10 mLs (200 mg total) into feeding tube every 6 (six) hours. 09/26/20  Yes Esaw Grandchild A, DO  clopidogrel (PLAVIX) 75 MG tablet Place 1 tablet (75 mg total) into feeding tube daily. 09/27/20  Yes Esaw Grandchild A, DO  Colchicine 0.6 MG CAPS 1 capsule by Feeding Tube route in the morning and at bedtime. 10/26/20  Yes [provider]  donepezil (ARICEPT) 10 MG tablet Take 10 mg by mouth at  bedtime.   Yes [provider]  escitalopram (LEXAPRO) 20 MG tablet Take 10 mg by mouth daily.   Yes [provider]  fenofibrate 160 MG tablet Take 160 mg by mouth daily. 12/11/16  Yes [provider]  fluticasone (FLONASE) 50 MCG/ACT nasal spray Place 1 spray into both nostrils daily.   Yes [provider]  HYDROcodone-acetaminophen (NORCO/VICODIN) 5-325 MG tablet Take 1 tablet by mouth every 6 (six) hours as needed for moderate pain. Feeding tube   Yes [provider]  insulin glargine (LANTUS) 100 UNIT/ML injection Inject 6 Units into the skin 2 (two) times daily. For DM   Yes [provider]  omeprazole (PRILOSEC) 20 MG capsule 40 mg 2 (two) times daily before a meal.   Yes [provider]  polyethylene glycol (MIRALAX / GLYCOLAX) 17 g packet Place 17 g into feeding tube daily. 09/27/20  Yes Esaw Grandchild A, DO  propranolol (INDERAL) 10 MG tablet Place 1 tablet (10 mg total) into feeding tube 2 (two) times daily. 10/17/20  Yes Angiulli, Mcarthur Rossetti, PA-C  QUEtiapine (SEROQUEL) 100 MG tablet Place 1 tablet (100 mg total) into feeding tube at bedtime. 10/17/20  Yes Angiulli, Mcarthur Rossetti, PA-C  rosuvastatin (CRESTOR) 20 MG tablet Place 1 tablet (20 mg total) into feeding tube daily. 10/18/20  Yes Angiulli, Mcarthur Rossetti, PA-C  Water For Irrigation, Sterile (FREE WATER) SOLN Place 230 mLs into feeding tube  4 (four) times daily. 10/17/20  Yes Angiulli, Mcarthur Rossetti, PA-C  JANUMET 50-1000 MG tablet Take 1 tablet by mouth 2 (two) times daily. 10/03/20   [provider]  lidocaine (LIDODERM) 5 % Place 1 patch onto the skin daily. Remove & Discard patch within 12 hours or as directed by MD 10/18/20   Angiulli, Mcarthur Rossetti, PA-C  nitroGLYCERIN (NITROSTAT) 0.4 MG SL tablet Place 1 tablet (0.4 mg total) under the tongue every 5 (five) minutes as needed for chest pain. 09/26/20   Pennie Banter, DO  Nutritional Supplements (FEEDING SUPPLEMENT, OSMOLITE 1.5 CAL,)  LIQD Place 355 mLs into feeding tube 4 (four) times daily. 10/17/20   Angiulli, Mcarthur Rossetti, PA-C      Allergies    Patient has no known allergies.    Review of Systems   Review of Systems  Constitutional:  Negative for chills and fever.  HENT:  Negative for ear pain and sore throat.   Eyes:  Negative for pain and visual disturbance.  Respiratory:  Negative for cough and shortness of breath.   Cardiovascular:  Negative for chest pain and palpitations.  Gastrointestinal:  Negative for abdominal pain and vomiting.  Genitourinary:  Negative for dysuria and hematuria.  Musculoskeletal:  Negative for arthralgias and back pain.  Skin:  Negative for color change and rash.  Neurological:  Negative for seizures and syncope.  All other systems reviewed and are negative.   Physical Exam Updated Vital Signs BP 134/66 (BP Location: Right Arm)   Pulse 77   Temp 98.1 F (36.7 C)   Resp 20   Ht 6\' 1"  (1.854 m)   Wt 93 kg   SpO2 97%   BMI 27.05 kg/m  Physical Exam Vitals and nursing note reviewed.  Constitutional:      General: He is not in acute distress.    Appearance: He is well-developed.  HENT:     Head: Normocephalic and atraumatic.  Eyes:     Conjunctiva/sclera: Conjunctivae normal.  Cardiovascular:     Rate and Rhythm: Normal rate and regular rhythm.     Heart sounds: No murmur heard. Pulmonary:     Effort: Pulmonary effort is normal. No respiratory distress.     Breath sounds: Normal breath sounds.  Abdominal:     Palpations: Abdomen is soft.     Tenderness: There is no abdominal tenderness.    Musculoskeletal:        General: No swelling.     Cervical back: Neck supple.  Skin:    General: Skin is warm and dry.     Capillary Refill: Capillary refill takes less than 2 seconds.  Neurological:     Mental Status: He is alert.  Psychiatric:        Mood and Affect: Mood normal.    ED Results / Procedures / Treatments   Labs (all labs ordered are listed, but only  abnormal results are displayed) Labs Reviewed - No data to display  EKG None  Radiology DG ABDOMEN PEG TUBE LOCATION  Result Date: 08/27/2021 CLINICAL DATA:  Confirmation of PEG tube placement EXAM: ABDOMEN - 1 VIEW COMPARISON:  08/27/2021 at 0104 hours FINDINGS: Contrast administered via indwelling PEG tube opacifies the stomach and duodenum. Residual contrast in the left colon. No extraluminal contrast is visualized. IMPRESSION: PEG tube in the stomach. No extraluminal contrast is visualized. Electronically Signed   By: 10/28/2021 M.D.   On: 08/27/2021 22:31   DG ABDOMEN PEG TUBE LOCATION  Result Date:  08/27/2021 CLINICAL DATA:  Peg tube placement EXAM: ABDOMEN - 1 VIEW COMPARISON:  08/26/2021 FINDINGS: Contrast was injected through the patient's PEG tube which is noted within the stomach. No contrast extravasation. Nonobstructive bowel gas pattern. IMPRESSION: Peg tube within the stomach.  No evidence of leak/extravasation. Electronically Signed   By: Charlett Nose M.D.   On: 08/27/2021 01:30   DG Abd Portable 1V  Result Date: 08/26/2021 CLINICAL DATA:  G-tube placement. EXAM: PORTABLE ABDOMEN - 1 VIEW COMPARISON:  Abdominal x-ray 07/10/2021 FINDINGS: Gastrostomy tube tip projects over the left upper quadrant, likely in the mid stomach. Bowel-gas pattern is nonobstructive. Air seen to the level the rectum. There is average stool burden. No suspicious calcifications. Single surgical clip overlies the upper mid abdomen, unchanged. Left basilar opacities persist in the lung base. IMPRESSION: 1. Nonobstructive bowel gas pattern. 2. Gastrostomy tube tip projects over the mid body of the stomach. 3. Left basilar atelectasis/airspace disease, similar to prior. Electronically Signed   By: Darliss Cheney M.D.   On: 08/26/2021 19:55    Procedures Procedures    Medications Ordered in ED Medications  iohexol (OMNIPAQUE) 300 MG/ML solution 30 mL (has no administration in time range)    ED  Course/ Medical Decision Making/ A&P                           Medical Decision Making Amount and/or Complexity of Data Reviewed Radiology: ordered.  Risk Prescription drug management.   66:28 PM 56 year old male who was seen yesterday after his PEG tube fell out by Dr. Audrie Lia emergency department.  Patient is alert oriented x3, no acute distress, afebrile, stable vital signs.  Patient sent back from nursing home for contrasted study to confirm PEG tube placement.  Contrasted study ordered.  Peg tube study confirmed proper location.  Patient stable for discharge.         Final Clinical Impression(s) / ED Diagnoses Final diagnoses:  PEG tube concern-confirmed proper location    Rx / DC Orders ED Discharge Orders     None         Franne Forts, DO 08/27/21 2303

## 2021-08-27 NOTE — Discharge Instructions (Signed)
Contrasted study was completed to confirm placement of PEG tube.  PEG tube in proper place with no leakage of contrast.  Ready to use as needed.

## 2021-08-27 NOTE — ED Triage Notes (Signed)
Pt brought in by [facility] from [facility]. Per facility pt had follow up abd xray today and result shows that PEG tube is not in proper placement.

## 2021-09-01 ENCOUNTER — Encounter: Payer: Self-pay | Admitting: Internal Medicine

## 2021-09-01 ENCOUNTER — Ambulatory Visit (INDEPENDENT_AMBULATORY_CARE_PROVIDER_SITE_OTHER): Payer: Medicaid Other | Admitting: Internal Medicine

## 2021-09-01 DIAGNOSIS — J9611 Chronic respiratory failure with hypoxia: Secondary | ICD-10-CM | POA: Diagnosis not present

## 2021-09-01 DIAGNOSIS — J690 Pneumonitis due to inhalation of food and vomit: Secondary | ICD-10-CM | POA: Diagnosis not present

## 2021-09-01 DIAGNOSIS — F17210 Nicotine dependence, cigarettes, uncomplicated: Secondary | ICD-10-CM

## 2021-09-01 NOTE — Assessment & Plan Note (Signed)
Counseled re importance of smoking cessation but did not meet time criteria for separate billing   °

## 2021-09-01 NOTE — Patient Instructions (Addendum)
No food for liquids  by mouth - everything thru the feeding tube unless observed by the Speech therapist   Do the best you can to never smoke again   You will need to keep up with your dental care to prevent bacteria from your teeth from getting down into your lungs.   Please schedule a follow up visit in 3 months but call sooner if needed with cxr on return

## 2021-09-01 NOTE — Assessment & Plan Note (Signed)
02 dep at 3lpm hs only  as of d/c from [facility] 07/05/21  02 sats ok at rest RA 09/01/2021 and he is non-ambulatory > continue noct 02 only for now         Each maintenance medication was reviewed in detail including emphasizing most importantly the difference between maintenance and prns and under what circumstances the prns are to be triggered using an action plan format where appropriate.  Total time for H and P, chart review, counseling, reviewing 02/hfa device(s) and generating customized AVS unique to this office visit / same day charting > 45 min with pt new to me

## 2021-09-01 NOTE — Assessment & Plan Note (Signed)
See admit 06/19/21/ [facility] atx vs pna > ST evaluated the pt and has been found to be silently aspirating. PEG placement was done and patient started on tube feeding.  My concern is his poor cough mechanics demonstrated today and flunking his ST eval place him at risk of future bacterial of food aspiration > recurrent pna/ resp failure as he has very limited reserve  But no  indication for additional bronchodilators  At this point  Explained in detail to pt and his wife re limited treatment options.  rec f/u with cxr in 3 m, sooner if needed
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
## 2021-09-11 ENCOUNTER — Encounter (HOSPITAL_COMMUNITY): Payer: Self-pay

## 2021-09-11 ENCOUNTER — Emergency Department (HOSPITAL_COMMUNITY)
Admission: EM | Admit: 2021-09-11 | Discharge: 2021-09-12 | Disposition: A | Discharge status: Skilled Nursing Facility | Payer: Medicaid Other | Attending: Emergency Medicine | Admitting: Emergency Medicine

## 2021-09-11 ENCOUNTER — Other Ambulatory Visit: Payer: Self-pay

## 2021-09-11 ENCOUNTER — Emergency Department (HOSPITAL_COMMUNITY): Payer: Medicaid Other

## 2021-09-11 DIAGNOSIS — I2510 Atherosclerotic heart disease of native coronary artery without angina pectoris: Secondary | ICD-10-CM | POA: Diagnosis not present

## 2021-09-11 DIAGNOSIS — T85598A Other mechanical complication of other gastrointestinal prosthetic devices, implants and grafts, initial encounter: Secondary | ICD-10-CM | POA: Insufficient documentation

## 2021-09-11 DIAGNOSIS — Z7902 Long term (current) use of antithrombotics/antiplatelets: Secondary | ICD-10-CM | POA: Insufficient documentation

## 2021-09-11 DIAGNOSIS — Z931 Gastrostomy status: Secondary | ICD-10-CM

## 2021-09-11 DIAGNOSIS — Z794 Long term (current) use of insulin: Secondary | ICD-10-CM | POA: Diagnosis not present

## 2021-09-11 DIAGNOSIS — E119 Type 2 diabetes mellitus without complications: Secondary | ICD-10-CM | POA: Diagnosis not present

## 2021-09-11 DIAGNOSIS — Z7982 Long term (current) use of aspirin: Secondary | ICD-10-CM | POA: Diagnosis not present

## 2021-09-11 HISTORY — DX: Pneumonitis due to inhalation of food and vomit: J69.0

## 2021-09-11 HISTORY — DX: Unspecified mood (affective) disorder: F39

## 2021-09-11 HISTORY — DX: Unspecified protein-calorie malnutrition: E46

## 2021-09-11 HISTORY — DX: Gastrostomy status: Z93.1

## 2021-09-11 HISTORY — DX: Polyneuropathy, unspecified: G62.9

## 2021-09-11 HISTORY — DX: Anemia, unspecified: D64.9

## 2021-09-11 MED ORDER — PANCRELIPASE (LIP-PROT-AMYL) 10440-39150 UNITS PO TABS
20880.0000 [IU] | ORAL_TABLET | Freq: Once | ORAL | Status: DC
  Filled 2021-09-11: qty 2

## 2021-09-11 MED ORDER — SODIUM BICARBONATE 650 MG PO TABS
650.0000 mg | ORAL_TABLET | Freq: Once | ORAL | Status: DC
  Filled 2021-09-11: qty 1

## 2021-09-11 MED ORDER — CARBAMAZEPINE 100 MG/5ML PO SUSP
200.0000 mg | Freq: Four times a day (QID) | ORAL | Status: DC
  Filled 2021-09-11 (×3): qty 10

## 2021-09-11 MED ORDER — ACETAMINOPHEN 500 MG PO TABS
1000.0000 mg | ORAL_TABLET | Freq: Once | ORAL | Status: AC
  Administered 2021-09-11: 1000 mg via ORAL
  Filled 2021-09-11: qty 2

## 2021-09-11 MED ORDER — DIATRIZOATE MEGLUMINE & SODIUM 66-10 % PO SOLN
30.0000 mL | Freq: Once | ORAL | Status: AC
  Administered 2021-09-11: 30 mL
  Filled 2021-09-11: qty 30

## 2021-09-11 MED ORDER — HYDROCODONE-ACETAMINOPHEN 7.5-325 MG/15ML PO SOLN
10.0000 mL | Freq: Once | ORAL | Status: AC
  Administered 2021-09-11: 10 mL via ORAL
  Filled 2021-09-11: qty 15

## 2021-09-11 MED ORDER — DIATRIZOATE MEGLUMINE & SODIUM 66-10 % PO SOLN
ORAL | Status: AC
  Filled 2021-09-11: qty 30

## 2021-09-11 NOTE — ED Triage Notes (Signed)
Pt arrived via ems from [facility].  Reports feeding tube stopped up since last night.  Pt requesting to have feeding tube removed.  Patient alert and oriented. C/O lower back pain but says it is chronic.

## 2021-09-11 NOTE — ED Provider Notes (Signed)
[facility] EMERGENCY DEPARTMENT Provider Note   CSN: [id number] Arrival date & time: 09/11/21  1546     History  Chief Complaint  Patient presents with   feeding tube stopped up    Brent [name] is a 56 y.o. male with a history of CAD, type 2 diabetes, history of dysphagia requiring PEG tube placement presenting from local nursing home for evaluation of a clogged PEG tube which the nursing staff was unable to fix prior to arrival.  He does tolerate some p.o. intake, but does require his PEG tube for certain medications and some nutrition.  He denies any abdominal pain or other concerns at this point.  The history is provided by the patient.       Home Medications Prior to Admission medications   Medication Sig Start Date End Date Taking? Authorizing Provider  albuterol (VENTOLIN HFA) 108 (90 Base) MCG/ACT inhaler Inhale 2 puffs into the lungs every 6 (six) hours as needed for wheezing or shortness of breath. 10/17/20  Yes Angiulli, Mcarthur Rossetti, PA-C  ALPRAZolam Prudy Feeler) 0.5 MG tablet Take 0.5 mg by mouth 3 (three) times daily as needed. 11/04/20  Yes [provider]  ascorbic acid (VITAMIN C) 500 MG tablet Take 500 mg by mouth 2 (two) times daily.   Yes [provider]  aspirin EC 81 MG tablet Take 1 tablet by mouth daily. Feeding tube 07/05/21  Yes [provider]  baclofen (LIORESAL) 10 MG tablet Take 10 mg by mouth 4 (four) times daily.   Yes [provider]  carBAMazepine (TEGRETOL) 100 MG/5ML suspension Place 10 mLs (200 mg total) into feeding tube every 6 (six) hours. 09/26/20  Yes Esaw Grandchild A, DO  clopidogrel (PLAVIX) 75 MG tablet Take 1 tablet by mouth daily. 07/05/21  Yes [provider]  Colchicine 0.6 MG CAPS 1 capsule by Feeding Tube route in the morning and at bedtime. 10/26/20  Yes [provider]  donepezil (ARICEPT) 10 MG tablet Take 10 mg by mouth at bedtime.   Yes [provider]  escitalopram (LEXAPRO)  20 MG tablet Take 10 mg by mouth daily.   Yes [provider]  fenofibrate 160 MG tablet Take 1 tablet by mouth daily. 12/11/16  Yes [provider]  fluticasone (FLONASE) 50 MCG/ACT nasal spray Place 1 spray into both nostrils daily.   Yes [provider]  HYDROcodone-acetaminophen (NORCO/VICODIN) 5-325 MG tablet Take 1 tablet by mouth every 6 (six) hours as needed for moderate pain. Feeding tube   Yes [provider]  insulin glargine (LANTUS) 100 UNIT/ML injection Inject 6 Units into the skin 2 (two) times daily. For DM   Yes [provider]  nitroGLYCERIN (NITROSTAT) 0.4 MG SL tablet Place 1 tablet (0.4 mg total) under the tongue every 5 (five) minutes as needed for chest pain. 09/26/20  Yes Esaw Grandchild A, DO  omeprazole (PRILOSEC) 20 MG capsule 40 mg 2 (two) times daily before a meal.   Yes [provider]  polyethylene glycol (MIRALAX / GLYCOLAX) 17 g packet Place 17 g into feeding tube daily. 09/27/20  Yes Esaw Grandchild A, DO  propranolol (INDERAL) 10 MG tablet Place 1 tablet (10 mg total) into feeding tube 2 (two) times daily. 10/17/20  Yes Angiulli, Mcarthur Rossetti, PA-C  QUEtiapine (SEROQUEL) 100 MG tablet Place 1 tablet (100 mg total) into feeding tube at bedtime. 10/17/20  Yes Angiulli, Mcarthur Rossetti, PA-C  rosuvastatin (CRESTOR) 20 MG tablet Place 1 tablet (20 mg total)  into feeding tube daily. 10/18/20  Yes Angiulli, Mcarthur Rossetti, PA-C  Water For Irrigation, Sterile (FREE WATER) SOLN Place 230 mLs into feeding tube 4 (four) times daily. 10/17/20  Yes Angiulli, Mcarthur Rossetti, PA-C      Allergies    Patient has no known allergies.    Review of Systems   Review of Systems  Constitutional:  Negative for fever.  HENT:  Negative for congestion and sore throat.   Eyes: Negative.   Respiratory:  Negative for chest tightness and shortness of breath.   Cardiovascular:  Negative for chest pain.  Gastrointestinal:  Negative for abdominal pain, nausea and  vomiting.  Genitourinary: Negative.   Musculoskeletal:  Negative for arthralgias, joint swelling and neck pain.  Skin: Negative.  Negative for rash and wound.  Neurological:  Negative for dizziness, weakness, light-headedness, numbness and headaches.  Psychiatric/Behavioral: Negative.    All other systems reviewed and are negative.   Physical Exam Updated Vital Signs BP 114/64 (BP Location: Left Arm)   Pulse 67   Temp 98.5 F (36.9 C) (Oral)   Resp 18   Ht 6' (1.829 m)   Wt 88 kg   SpO2 95%   BMI 26.31 kg/m  Physical Exam Vitals and nursing note reviewed.  Constitutional:      Appearance: He is well-developed.  HENT:     Head: Normocephalic and atraumatic.  Eyes:     Conjunctiva/sclera: Conjunctivae normal.  Cardiovascular:     Rate and Rhythm: Normal rate and regular rhythm.     Heart sounds: Normal heart sounds.  Pulmonary:     Effort: Pulmonary effort is normal.     Breath sounds: Normal breath sounds. No wheezing.  Abdominal:     General: Bowel sounds are normal.     Palpations: Abdomen is soft.     Tenderness: There is no abdominal tenderness. There is no guarding.     Comments: PEG tube is in place left upper abdomen.  The skin surrounding the ostomy appears healthy and well-healed.  The PEG tube itself is clean.  Attempt to flush water through the tube was unsuccessful.  Musculoskeletal:        General: Normal range of motion.     Cervical back: Normal range of motion.  Skin:    General: Skin is warm and dry.  Neurological:     Mental Status: He is alert.     ED Results / Procedures / Treatments   Labs (all labs ordered are listed, but only abnormal results are displayed) Labs Reviewed - No data to display  EKG None  Radiology No results found.  Procedures FEEDING TUBE REPLACEMENT  Date/Time: 09/11/2021 10:19 PM  Performed by: Burgess Amor, PA-C Authorized by: Burgess Amor, PA-C  Consent: Verbal consent obtained. Risks and benefits: risks,  benefits and alternatives were discussed Consent given by: patient Patient understanding: patient states understanding of the procedure being performed Patient identity confirmed: verbally with patient Time out: Immediately prior to procedure a "time out" was called to verify the correct patient, procedure, equipment, support staff and site/side marked as required. Preparation: Patient was prepped and draped in the usual sterile fashion. Indications: tube blocked Local anesthesia used: no  Anesthesia: Local anesthesia used: no  Sedation: Patient sedated: no  Tube type: gastrostomy Patient position: supine Procedure type: replacement Tube size: 18 Fr Endoscope used: no Bulb inflation volume: 5 (ml) Bulb inflation fluid: normal saline Placement/position confirmation: x-ray Tube placement difficulty: none Patient tolerance: patient tolerated the procedure well  with no immediate complications       Medications Ordered in ED Medications  lipase/protease/amylase) (VIOKACE) tablets 20,880 Units (20,880 Units Per Tube Not Given 09/11/21 2017)    And  sodium bicarbonate tablet 650 mg (650 mg Per Tube Not Given 09/11/21 2018)  diatrizoate meglumine-sodium (GASTROGRAFIN) 66-10 % solution (has no administration in time range)  diatrizoate meglumine-sodium (GASTROGRAFIN) 66-10 % solution 30 mL (has no administration in time range)  acetaminophen (TYLENOL) tablet 1,000 mg (1,000 mg Oral Given 09/11/21 2143)    ED Course/ Medical Decision Making/ A&P                           Medical Decision Making RN tried warm water flush followed by [employer] which was not successful in getting the tube to flush.  We tried to obtain pancreatic enzymes, however none was available unfortunately.  Therefore the PEG tube had to be replaced.  Amount and/or Complexity of Data Reviewed Radiology: ordered.  Risk OTC drugs. Prescription drug management.           Final Clinical Impression(s) /  ED Diagnoses Final diagnoses:  Feeding tube blocked, initial encounter    Rx / DC Orders ED Discharge Orders     None         Victoriano Lain 09/11/21 2220    Franne Forts, DO 09/14/21 2101

## 2021-09-11 NOTE — Discharge Instructions (Signed)
See your doctor for any further problems or concerns with your feeding tube.

## 2021-09-11 NOTE — ED Notes (Signed)
Feeding tube assessed with no visible obstructions or abnormalities around the insertion site. Patient denies pain related to the tube and requests to have it removed.

## 2021-09-11 NOTE — ED Notes (Signed)
Attempted to unclog feeding tube by putting shasta cola in feeding tube, let sit for about 5 minutes, then attempted to flush multiple times without success- Burgess Amor, PA made aware.

## 2021-09-27 ENCOUNTER — Inpatient Hospital Stay (HOSPITAL_COMMUNITY): Payer: Medicaid Other

## 2021-09-27 ENCOUNTER — Encounter (HOSPITAL_COMMUNITY): Payer: Self-pay | Admitting: *Deleted

## 2021-09-27 ENCOUNTER — Other Ambulatory Visit: Payer: Self-pay

## 2021-09-27 ENCOUNTER — Emergency Department (HOSPITAL_COMMUNITY): Payer: Medicaid Other

## 2021-09-27 ENCOUNTER — Inpatient Hospital Stay (HOSPITAL_COMMUNITY)
Admission: EM | Admit: 2021-09-27 | Discharge: 2021-09-29 | DRG: 177 | Disposition: A | Discharge status: Skilled Nursing Facility | Payer: Medicaid Other | Source: Skilled Nursing Facility | Attending: Internal Medicine | Admitting: Internal Medicine

## 2021-09-27 DIAGNOSIS — Z931 Gastrostomy status: Secondary | ICD-10-CM

## 2021-09-27 DIAGNOSIS — F411 Generalized anxiety disorder: Secondary | ICD-10-CM | POA: Diagnosis present

## 2021-09-27 DIAGNOSIS — J9601 Acute respiratory failure with hypoxia: Secondary | ICD-10-CM | POA: Diagnosis present

## 2021-09-27 DIAGNOSIS — J690 Pneumonitis due to inhalation of food and vomit: Principal | ICD-10-CM | POA: Diagnosis present

## 2021-09-27 DIAGNOSIS — K219 Gastro-esophageal reflux disease without esophagitis: Secondary | ICD-10-CM | POA: Diagnosis present

## 2021-09-27 DIAGNOSIS — R569 Unspecified convulsions: Secondary | ICD-10-CM

## 2021-09-27 DIAGNOSIS — E0842 Diabetes mellitus due to underlying condition with diabetic polyneuropathy: Secondary | ICD-10-CM | POA: Diagnosis present

## 2021-09-27 DIAGNOSIS — Z79899 Other long term (current) drug therapy: Secondary | ICD-10-CM

## 2021-09-27 DIAGNOSIS — F0393 Unspecified dementia, unspecified severity, with mood disturbance: Secondary | ICD-10-CM | POA: Diagnosis present

## 2021-09-27 DIAGNOSIS — Z8249 Family history of ischemic heart disease and other diseases of the circulatory system: Secondary | ICD-10-CM | POA: Diagnosis not present

## 2021-09-27 DIAGNOSIS — E785 Hyperlipidemia, unspecified: Secondary | ICD-10-CM | POA: Diagnosis present

## 2021-09-27 DIAGNOSIS — E1142 Type 2 diabetes mellitus with diabetic polyneuropathy: Secondary | ICD-10-CM | POA: Diagnosis present

## 2021-09-27 DIAGNOSIS — Z825 Family history of asthma and other chronic lower respiratory diseases: Secondary | ICD-10-CM | POA: Diagnosis not present

## 2021-09-27 DIAGNOSIS — F319 Bipolar disorder, unspecified: Secondary | ICD-10-CM | POA: Diagnosis present

## 2021-09-27 DIAGNOSIS — F0394 Unspecified dementia, unspecified severity, with anxiety: Secondary | ICD-10-CM | POA: Diagnosis present

## 2021-09-27 DIAGNOSIS — I2510 Atherosclerotic heart disease of native coronary artery without angina pectoris: Secondary | ICD-10-CM | POA: Diagnosis present

## 2021-09-27 DIAGNOSIS — G40909 Epilepsy, unspecified, not intractable, without status epilepticus: Secondary | ICD-10-CM | POA: Diagnosis present

## 2021-09-27 DIAGNOSIS — J441 Chronic obstructive pulmonary disease with (acute) exacerbation: Secondary | ICD-10-CM | POA: Diagnosis present

## 2021-09-27 DIAGNOSIS — Z7951 Long term (current) use of inhaled steroids: Secondary | ICD-10-CM

## 2021-09-27 DIAGNOSIS — J9621 Acute and chronic respiratory failure with hypoxia: Secondary | ICD-10-CM | POA: Diagnosis present

## 2021-09-27 DIAGNOSIS — F03918 Unspecified dementia, unspecified severity, with other behavioral disturbance: Secondary | ICD-10-CM | POA: Diagnosis present

## 2021-09-27 DIAGNOSIS — I69354 Hemiplegia and hemiparesis following cerebral infarction affecting left non-dominant side: Secondary | ICD-10-CM

## 2021-09-27 DIAGNOSIS — G9340 Encephalopathy, unspecified: Secondary | ICD-10-CM | POA: Diagnosis present

## 2021-09-27 DIAGNOSIS — F17210 Nicotine dependence, cigarettes, uncomplicated: Secondary | ICD-10-CM | POA: Diagnosis present

## 2021-09-27 DIAGNOSIS — I10 Essential (primary) hypertension: Secondary | ICD-10-CM | POA: Diagnosis present

## 2021-09-27 DIAGNOSIS — Z20822 Contact with and (suspected) exposure to covid-19: Secondary | ICD-10-CM | POA: Diagnosis present

## 2021-09-27 DIAGNOSIS — J9611 Chronic respiratory failure with hypoxia: Secondary | ICD-10-CM | POA: Diagnosis present

## 2021-09-27 DIAGNOSIS — J189 Pneumonia, unspecified organism: Secondary | ICD-10-CM | POA: Diagnosis present

## 2021-09-27 DIAGNOSIS — G4700 Insomnia, unspecified: Secondary | ICD-10-CM | POA: Diagnosis present

## 2021-09-27 DIAGNOSIS — Z7902 Long term (current) use of antithrombotics/antiplatelets: Secondary | ICD-10-CM

## 2021-09-27 DIAGNOSIS — Z7982 Long term (current) use of aspirin: Secondary | ICD-10-CM | POA: Diagnosis not present

## 2021-09-27 DIAGNOSIS — Z794 Long term (current) use of insulin: Secondary | ICD-10-CM

## 2021-09-27 LAB — CBC WITH DIFFERENTIAL/PLATELET
Abs Immature Granulocytes: 0.04 10*3/uL (ref 0.00–0.07)
Basophils Absolute: 0.1 10*3/uL (ref 0.0–0.1)
Basophils Relative: 1 %
Eosinophils Absolute: 0.3 10*3/uL (ref 0.0–0.5)
Eosinophils Relative: 3 %
HCT: 38.1 % — ABNORMAL LOW (ref 39.0–52.0)
Hemoglobin: 11.9 g/dL — ABNORMAL LOW (ref 13.0–17.0)
Immature Granulocytes: 0 %
Lymphocytes Relative: 17 %
Lymphs Abs: 1.8 10*3/uL (ref 0.7–4.0)
MCH: 28.2 pg (ref 26.0–34.0)
MCHC: 31.2 g/dL (ref 30.0–36.0)
MCV: 90.3 fL (ref 80.0–100.0)
Monocytes Absolute: 0.8 10*3/uL (ref 0.1–1.0)
Monocytes Relative: 8 %
Neutro Abs: 7.3 10*3/uL (ref 1.7–7.7)
Neutrophils Relative %: 71 %
Platelets: 364 10*3/uL (ref 150–400)
RBC: 4.22 MIL/uL (ref 4.22–5.81)
RDW: 13.3 % (ref 11.5–15.5)
WBC: 10.2 10*3/uL (ref 4.0–10.5)
nRBC: 0 % (ref 0.0–0.2)

## 2021-09-27 LAB — COMPREHENSIVE METABOLIC PANEL
ALT: 9 U/L (ref 0–44)
AST: 12 U/L — ABNORMAL LOW (ref 15–41)
Albumin: 3.4 g/dL — ABNORMAL LOW (ref 3.5–5.0)
Alkaline Phosphatase: 79 U/L (ref 38–126)
Anion gap: 8 (ref 5–15)
BUN: 16 mg/dL (ref 6–20)
CO2: 29 mmol/L (ref 22–32)
Calcium: 9.8 mg/dL (ref 8.9–10.3)
Chloride: 105 mmol/L (ref 98–111)
Creatinine, Ser: 1.32 mg/dL — ABNORMAL HIGH (ref 0.61–1.24)
GFR, Estimated: >60 mL/min (ref >60)
Glucose, Bld: 253 mg/dL — ABNORMAL HIGH (ref 70–99)
Potassium: 4.2 mmol/L (ref 3.5–5.1)
Sodium: 142 mmol/L (ref 135–145)
Total Bilirubin: 0.2 mg/dL — ABNORMAL LOW (ref 0.3–1.2)
Total Protein: 8.7 g/dL — ABNORMAL HIGH (ref 6.5–8.1)

## 2021-09-27 LAB — URINALYSIS, ROUTINE W REFLEX MICROSCOPIC
Bacteria, UA: NONE SEEN
Bilirubin Urine: NEGATIVE
Glucose, UA: >=500 mg/dL — AB
Hgb urine dipstick: NEGATIVE
Ketones, ur: NEGATIVE mg/dL
Leukocytes,Ua: NEGATIVE
Nitrite: NEGATIVE
Protein, ur: 100 mg/dL — AB
Specific Gravity, Urine: 1.02 (ref 1.005–1.030)
pH: 5 (ref 5.0–8.0)

## 2021-09-27 LAB — BLOOD GAS, VENOUS
Acid-Base Excess: 1.9 mmol/L (ref 0.0–2.0)
Bicarbonate: 31 mmol/L — ABNORMAL HIGH (ref 20.0–28.0)
Drawn by: 7012
FIO2: 50 %
O2 Saturation: 44.9 %
Patient temperature: 36.1
pCO2, Ven: 66 mmHg — ABNORMAL HIGH (ref 44–60)
pH, Ven: 7.27 (ref 7.25–7.43)
pO2, Ven: <31 mmHg — CL (ref 32–45)

## 2021-09-27 LAB — HEMOGLOBIN A1C
Hgb A1c MFr Bld: 8.7 % — ABNORMAL HIGH (ref 4.8–5.6)
Mean Plasma Glucose: 202.99 mg/dL

## 2021-09-27 LAB — CBG MONITORING, ED: Glucose-Capillary: 184 mg/dL — ABNORMAL HIGH (ref 70–99)

## 2021-09-27 LAB — RESP PANEL BY RT-PCR (FLU A&B, COVID) ARPGX2
Influenza A by PCR: NEGATIVE
Influenza B by PCR: NEGATIVE
SARS Coronavirus 2 by RT PCR: NEGATIVE

## 2021-09-27 LAB — BRAIN NATRIURETIC PEPTIDE: B Natriuretic Peptide: 43 pg/mL (ref 0.0–100.0)

## 2021-09-27 LAB — GLUCOSE, CAPILLARY: Glucose-Capillary: 126 mg/dL — ABNORMAL HIGH (ref 70–99)

## 2021-09-27 LAB — TROPONIN I (HIGH SENSITIVITY)
Troponin I (High Sensitivity): 5 ng/L (ref <18)
Troponin I (High Sensitivity): 5 ng/L (ref <18)

## 2021-09-27 LAB — HIV ANTIBODY (ROUTINE TESTING W REFLEX): HIV Screen 4th Generation wRfx: NONREACTIVE

## 2021-09-27 MED ORDER — CLOPIDOGREL BISULFATE 75 MG PO TABS
75.0000 mg | ORAL_TABLET | Freq: Every day | ORAL | Status: DC
  Administered 2021-09-28 – 2021-09-29 (×2): 75 mg via ORAL
  Filled 2021-09-27 (×2): qty 1

## 2021-09-27 MED ORDER — PROPRANOLOL HCL 20 MG PO TABS
10.0000 mg | ORAL_TABLET | Freq: Two times a day (BID) | ORAL | Status: DC
  Administered 2021-09-27 – 2021-09-29 (×4): 10 mg
  Filled 2021-09-27 (×4): qty 1

## 2021-09-27 MED ORDER — FENOFIBRATE 160 MG PO TABS
160.0000 mg | ORAL_TABLET | Freq: Every day | ORAL | Status: DC
  Administered 2021-09-28 – 2021-09-29 (×2): 160 mg via ORAL
  Filled 2021-09-27 (×2): qty 1

## 2021-09-27 MED ORDER — CHLORHEXIDINE GLUCONATE CLOTH 2 % EX PADS: 6.0000 | MEDICATED_PAD | Freq: Every day | CUTANEOUS | Status: DC

## 2021-09-27 MED ORDER — SODIUM CHLORIDE 0.9 % IV BOLUS
1000.0000 mL | Freq: Once | INTRAVENOUS | Status: AC
  Administered 2021-09-27: 1000 mL via INTRAVENOUS

## 2021-09-27 MED ORDER — DONEPEZIL HCL 5 MG PO TABS
10.0000 mg | ORAL_TABLET | Freq: Every day | ORAL | Status: DC
  Administered 2021-09-27 – 2021-09-28 (×2): 10 mg via ORAL
  Filled 2021-09-27 (×2): qty 2

## 2021-09-27 MED ORDER — INSULIN GLARGINE-YFGN 100 UNIT/ML ~~LOC~~ SOLN
6.0000 [IU] | Freq: Two times a day (BID) | SUBCUTANEOUS | Status: DC
  Administered 2021-09-27 – 2021-09-29 (×4): 6 [IU] via SUBCUTANEOUS
  Filled 2021-09-27 (×7): qty 0.06

## 2021-09-27 MED ORDER — SODIUM CHLORIDE 0.9 % IV SOLN
3.0000 g | Freq: Four times a day (QID) | INTRAVENOUS | Status: DC
  Administered 2021-09-27 – 2021-09-29 (×8): 3 g via INTRAVENOUS
  Filled 2021-09-27 (×13): qty 8

## 2021-09-27 MED ORDER — HEPARIN SODIUM (PORCINE) 5000 UNIT/ML IJ SOLN
5000.0000 [IU] | Freq: Three times a day (TID) | INTRAMUSCULAR | Status: DC
  Administered 2021-09-27 – 2021-09-29 (×5): 5000 [IU] via SUBCUTANEOUS
  Filled 2021-09-27 (×6): qty 1

## 2021-09-27 MED ORDER — ROSUVASTATIN CALCIUM 20 MG PO TABS
20.0000 mg | ORAL_TABLET | Freq: Every day | ORAL | Status: DC
Start: 2021-09-27 — End: 2021-09-29
  Administered 2021-09-27 – 2021-09-28 (×2): 20 mg
  Filled 2021-09-27 (×2): qty 1

## 2021-09-27 MED ORDER — CARBAMAZEPINE 100 MG/5ML PO SUSP
200.0000 mg | Freq: Four times a day (QID) | ORAL | Status: DC
Start: 2021-09-27 — End: 2021-09-27
  Filled 2021-09-27 (×8): qty 10

## 2021-09-27 MED ORDER — INSULIN ASPART 100 UNIT/ML IJ SOLN
0.0000 [IU] | Freq: Three times a day (TID) | INTRAMUSCULAR | Status: DC
  Administered 2021-09-27 – 2021-09-28 (×3): 2 [IU] via SUBCUTANEOUS
  Administered 2021-09-29: 1 [IU] via SUBCUTANEOUS

## 2021-09-27 MED ORDER — ACETAMINOPHEN 650 MG RE SUPP: 650.0000 mg | Freq: Four times a day (QID) | RECTAL | Status: DC | PRN

## 2021-09-27 MED ORDER — NITROGLYCERIN 0.4 MG SL SUBL: 0.4000 mg | SUBLINGUAL_TABLET | SUBLINGUAL | Status: DC | PRN

## 2021-09-27 MED ORDER — ASPIRIN 81 MG PO CHEW
81.0000 mg | CHEWABLE_TABLET | Freq: Every day | ORAL | Status: DC
  Administered 2021-09-28 – 2021-09-29 (×2): 81 mg via ORAL
  Filled 2021-09-27 (×2): qty 1

## 2021-09-27 MED ORDER — ONDANSETRON HCL 4 MG/2ML IJ SOLN: 4.0000 mg | Freq: Four times a day (QID) | INTRAMUSCULAR | Status: DC | PRN

## 2021-09-27 MED ORDER — INSULIN ASPART 100 UNIT/ML IJ SOLN: 0.0000 [IU] | Freq: Every day | INTRAMUSCULAR | Status: DC

## 2021-09-27 MED ORDER — IPRATROPIUM-ALBUTEROL 0.5-2.5 (3) MG/3ML IN SOLN: 3.0000 mL | Freq: Four times a day (QID) | RESPIRATORY_TRACT | Status: DC | PRN

## 2021-09-27 MED ORDER — FLUTICASONE PROPIONATE 50 MCG/ACT NA SUSP
1.0000 | Freq: Every day | NASAL | Status: DC
  Administered 2021-09-28 – 2021-09-29 (×2): 1 via NASAL
  Filled 2021-09-27: qty 16

## 2021-09-27 MED ORDER — ONDANSETRON HCL 4 MG/2ML IJ SOLN
4.0000 mg | Freq: Once | INTRAMUSCULAR | Status: AC
  Administered 2021-09-27: 4 mg via INTRAVENOUS
  Filled 2021-09-27: qty 2

## 2021-09-27 MED ORDER — ACETAMINOPHEN 325 MG PO TABS
650.0000 mg | ORAL_TABLET | Freq: Four times a day (QID) | ORAL | Status: DC | PRN
  Administered 2021-09-27 – 2021-09-28 (×3): 650 mg via ORAL
  Filled 2021-09-27 (×3): qty 2

## 2021-09-27 MED ORDER — ONDANSETRON HCL 4 MG PO TABS: 4.0000 mg | ORAL_TABLET | Freq: Four times a day (QID) | ORAL | Status: DC | PRN

## 2021-09-27 MED ORDER — LEVETIRACETAM 100 MG/ML PO SOLN
500.0000 mg | Freq: Two times a day (BID) | ORAL | Status: DC
  Administered 2021-09-27 – 2021-09-29 (×4): 500 mg
  Filled 2021-09-27 (×8): qty 5

## 2021-09-27 MED ORDER — FREE WATER
230.0000 mL | Freq: Four times a day (QID) | Status: DC
  Administered 2021-09-27 – 2021-09-29 (×6): 230 mL

## 2021-09-27 MED ORDER — LACTATED RINGERS IV SOLN: INTRAVENOUS | Status: AC

## 2021-09-27 MED ORDER — BUDESONIDE 0.25 MG/2ML IN SUSP
0.2500 mg | Freq: Two times a day (BID) | RESPIRATORY_TRACT | Status: DC
  Administered 2021-09-28 – 2021-09-29 (×3): 0.25 mg via RESPIRATORY_TRACT
  Filled 2021-09-27 (×3): qty 2

## 2021-09-27 MED ORDER — POLYETHYLENE GLYCOL 3350 17 G PO PACK
17.0000 g | PACK | Freq: Every day | ORAL | Status: DC
Start: 2021-09-28 — End: 2021-09-29
  Filled 2021-09-27 (×2): qty 1

## 2021-09-27 NOTE — ED Triage Notes (Addendum)
Pt arrived by [facility] from [facility] with c/o slurred speech and lethargy that started approximately 40 minutes ago. EMS reports pt had a lot of thick, green sputum coming up. Periods of apnea when pt was laying flat. Pt currently on 15L NRB by EMS. BP 115/71, HR 50, O2 sat 99% on NRB for EMS.

## 2021-09-27 NOTE — H&P (Signed)
History and Physical    RIDDIK SENNA VBT:[id number] DOB: 08/16/1965 DOA: 09/27/2021  PCP: [name], MD   Patient coming from: SNF  Chief Complaint: AMS/lethargy with hypoxemia  HPI: Brent [name] is a 56 y.o. male with medical history significant for COPD with chronic hypoxemia, recurrent aspiration pneumonia in the setting of dysphagia with PEG tube placement, hypertension, CAD, type 2 diabetes, prior CVA, and insomnia who presented to the emergency department via his SNF with some slurred speech and lethargy that has been apparently worsening over the course of the last 1 week.  He has been having thick, greenish sputum and periods of apnea when laying flat.  He was noted to be more hypoxemic and was requiring 15 L nonrebreather by EMS.  No fevers or chills noted and family members at bedside state that he is usually conversational and is able to tolerate pured diet.   ED Course: Vital signs stable and patient is afebrile.  Hemoglobin 11.9 and creatinine 1.32.  Chest x-ray with some left lower lobe pneumonia and he is currently on BiPAP.  He has been started on Unasyn empirically due to suspicion of aspiration pneumonia.  Review of Systems: Could not be obtained given patient condition.  Past Medical History:  Diagnosis Date   acute posthemorrhagic anemia    Anxiety    Aplastic anemia, unspecified (HCC)    Bipolar disorder (HCC)    Chest pain, unspecified    Chronic airway obstruction, not elsewhere classified    Cocaine substance abuse (HCC)    Dysphagia    Gastrostomy status (HCC)    Hyperlipidemia    Mood disorder (HCC)    Neuropathy    Other and unspecified hyperlipidemia    Pneumonitis due to inhalation of food and vomit (HCC)    Polyneuropathy    Shortness of breath    Stroke (HCC) 06/17/2020   Tobacco use disorder    Type II or unspecified type diabetes mellitus without mention of complication, not stated as uncontrolled    Unspecified epilepsy without  mention of intractable epilepsy    Unspecified essential hypertension    Unspecified protein-calorie malnutrition (HCC)     Past Surgical History:  Procedure Laterality Date   BIOPSY  10/02/2020   Procedure: BIOPSY;  Surgeon: Jenel Lucks, MD;  Location: [facility] ENDOSCOPY;  Service: Gastroenterology;;   CARDIAC CATHETERIZATION  10/02/2010    Nonobstructive mild coronary plaque   ESOPHAGOGASTRODUODENOSCOPY N/A 10/02/2020   Procedure: ESOPHAGOGASTRODUODENOSCOPY (EGD);  Surgeon: Jenel Lucks, MD;  Location[facility] ENDOSCOPY;  Service: Gastroenterology;  Laterality: N/A;   IR CM INJ ANY COLONIC TUBE W/FLUORO  12/04/2020   IR GASTROSTOMY TUBE MOD SED  09/24/2020   IR GASTROSTOMY TUBE REMOVAL  04/21/2021   KNEE SURGERY Bilateral 2018   Repair   PERFORATED VISCUS SURGERY     MULTIPLE FRACTURES     reports that he has been smoking cigarettes. He has a 7.50 pack-year smoking history. He has never used smokeless tobacco. He reports that he does not currently use alcohol. He reports that he does not currently use drugs after having used the following drugs: Cocaine.  No Known Allergies  Family History  Problem Relation Age of Onset   Heart attack Father    COPD Father    Coronary artery disease Mother     Prior to Admission medications   Medication Sig Start Date End Date Taking? Authorizing Provider  albuterol (VENTOLIN HFA) 108 (90 Base) MCG/ACT inhaler Inhale 2 puffs into the  lungs every 6 (six) hours as needed for wheezing or shortness of breath. 10/17/20   Angiulli, Mcarthur Rossetti, PA-C  ALPRAZolam Prudy Feeler) 0.5 MG tablet Take 0.5 mg by mouth 3 (three) times daily as needed. 11/04/20   [provider]  ascorbic acid (VITAMIN C) 500 MG tablet Take 500 mg by mouth 2 (two) times daily.    [provider]  aspirin EC 81 MG tablet Take 1 tablet by mouth daily. Feeding tube 07/05/21   [provider]  baclofen (LIORESAL) 10 MG tablet Take 10 mg by mouth 4 (four) times daily.     [provider]  carBAMazepine (TEGRETOL) 100 MG/5ML suspension Place 10 mLs (200 mg total) into feeding tube every 6 (six) hours. 09/26/20   Pennie Banter, DO  clopidogrel (PLAVIX) 75 MG tablet Take 1 tablet by mouth daily. 07/05/21   [provider]  Colchicine 0.6 MG CAPS 1 capsule by Feeding Tube route in the morning and at bedtime. 10/26/20   [provider]  donepezil (ARICEPT) 10 MG tablet Take 10 mg by mouth at bedtime.    [provider]  escitalopram (LEXAPRO) 20 MG tablet Take 10 mg by mouth daily.    [provider]  fenofibrate 160 MG tablet Take 1 tablet by mouth daily. 12/11/16   [provider]  fluticasone (FLONASE) 50 MCG/ACT nasal spray Place 1 spray into both nostrils daily.    [provider]  HYDROcodone-acetaminophen (NORCO/VICODIN) 5-325 MG tablet Take 1 tablet by mouth every 6 (six) hours as needed for moderate pain. Feeding tube    [provider]  insulin glargine (LANTUS) 100 UNIT/ML injection Inject 6 Units into the skin 2 (two) times daily. For DM    [provider]  nitroGLYCERIN (NITROSTAT) 0.4 MG SL tablet Place 1 tablet (0.4 mg total) under the tongue every 5 (five) minutes as needed for chest pain. 09/26/20   Pennie Banter, DO  omeprazole (PRILOSEC) 20 MG capsule 40 mg 2 (two) times daily before a meal.    [provider]  polyethylene glycol (MIRALAX / GLYCOLAX) 17 g packet Place 17 g into feeding tube daily. 09/27/20   Pennie Banter, DO  propranolol (INDERAL) 10 MG tablet Place 1 tablet (10 mg total) into feeding tube 2 (two) times daily. 10/17/20   Angiulli, Mcarthur Rossetti, PA-C  QUEtiapine (SEROQUEL) 100 MG tablet Place 1 tablet (100 mg total) into feeding tube at bedtime. 10/17/20   Angiulli, Mcarthur Rossetti, PA-C  rosuvastatin (CRESTOR) 20 MG tablet Place 1 tablet (20 mg total) into feeding tube daily. 10/18/20   Angiulli, Mcarthur Rossetti, PA-C  Water For Irrigation, Sterile (FREE WATER)  SOLN Place 230 mLs into feeding tube 4 (four) times daily. 10/17/20   Charlton Amor, PA-C    Physical Exam: Vitals:   09/27/21 1255 09/27/21 1300 09/27/21 1330 09/27/21 1415  BP: 91/75 97/85 (!) 117/95 115/77  Pulse: 77 67 64 (!) 51  Resp: 17 16 16 16   Temp: (!) 97 F (36.1 C)     TempSrc: Axillary     SpO2: 100% 100% 100% 100%  Weight:      Height:        Constitutional: Altered/confused on BiPAP Vitals:   09/27/21 1255 09/27/21 1300 09/27/21 1330 09/27/21 1415  BP: 91/75 97/85 (!) 117/95 115/77  Pulse: 77 67 64 (!) 51  Resp: 17 16 16 16   Temp: (!) 97 F (36.1 C)     TempSrc: Axillary  SpO2: 100% 100% 100% 100%  Weight:      Height:       Eyes: lids and conjunctivae normal Neck: normal, supple Respiratory: clear to auscultation bilaterally. Normal respiratory effort. No accessory muscle use.  Currently on BiPAP Cardiovascular: Regular rate and rhythm, no murmurs. Abdomen: no tenderness, no distention. Bowel sounds positive.  PEG tube present C/D/I Musculoskeletal:  No edema. Skin: no rashes, lesions, ulcers.  Psychiatric: Flat affect  Labs on Admission: I have personally reviewed following labs and imaging studies  CBC: Recent Labs  Lab 09/27/21 1245  WBC 10.2  NEUTROABS 7.3  HGB 11.9*  HCT 38.1*  MCV 90.3  PLT 364   Basic Metabolic Panel: Recent Labs  Lab 09/27/21 1245  NA 142  K 4.2  CL 105  CO2 29  GLUCOSE 253*  BUN 16  CREATININE 1.32*  CALCIUM 9.8   GFR: Estimated Creatinine Clearance: 68.6 mL/min (A) (by C-G formula based on SCr of 1.32 mg/dL (H)). Liver Function Tests: Recent Labs  Lab 09/27/21 1245  AST 12*  ALT 9  ALKPHOS 79  BILITOT 0.2*  PROT 8.7*  ALBUMIN 3.4*   No results for input(s): "LIPASE", "AMYLASE" in the last 168 hours. No results for input(s): "AMMONIA" in the last 168 hours. Coagulation Profile: No results for input(s): "INR", "PROTIME" in the last 168 hours. Cardiac Enzymes: No results for input(s):  "CKTOTAL", "CKMB", "CKMBINDEX", "TROPONINI" in the last 168 hours. BNP (last 3 results) No results for input(s): "PROBNP" in the last 8760 hours. HbA1C: No results for input(s): "HGBA1C" in the last 72 hours. CBG: No results for input(s): "GLUCAP" in the last 168 hours. Lipid Profile: No results for input(s): "CHOL", "HDL", "LDLCALC", "TRIG", "CHOLHDL", "LDLDIRECT" in the last 72 hours. Thyroid Function Tests: No results for input(s): "TSH", "T4TOTAL", "FREET4", "T3FREE", "THYROIDAB" in the last 72 hours. Anemia Panel: No results for input(s): "VITAMINB12", "FOLATE", "FERRITIN", "TIBC", "IRON", "RETICCTPCT" in the last 72 hours. Urine analysis:    Component Value Date/Time   COLORURINE YELLOW 09/27/2021 1346   APPEARANCEUR CLEAR 09/27/2021 1346   LABSPEC 1.020 09/27/2021 1346   PHURINE 5.0 09/27/2021 1346   GLUCOSEU >=500 (A) 09/27/2021 1346   HGBUR NEGATIVE 09/27/2021 1346   BILIRUBINUR NEGATIVE 09/27/2021 1346   KETONESUR NEGATIVE 09/27/2021 1346   PROTEINUR 100 (A) 09/27/2021 1346   NITRITE NEGATIVE 09/27/2021 1346   LEUKOCYTESUR NEGATIVE 09/27/2021 1346    Radiological Exams on Admission: DG Chest Portable 1 View  Result Date: 09/27/2021 CLINICAL DATA:  Shortness of breath. EXAM: PORTABLE CHEST 1 VIEW COMPARISON:  07/02/2021 and prior studies FINDINGS: The cardiomediastinal silhouette is unremarkable. There is significantly improved aeration of the LEFT lung some residual atelectasis/consolidation within the MEDIAL LEFT LOWER lung noted. No large pleural effusion or pneumothorax noted. No acute bony abnormalities are identified. IMPRESSION: Significantly improved LEFT lung aeration since 07/02/2021 with some residual atelectasis/consolidation within the MEDIAL LEFT LOWER lung. No other significant change. Electronically Signed   By: Harmon Pier M.D.   On: 09/27/2021 13:38    EKG: Independently reviewed.  SR 77 bpm  Assessment/Plan Principal Problem:   Acute hypoxemic  respiratory failure (HCC) Active Problems:   GERD (gastroesophageal reflux disease)   Cigarette smoker   Diabetic polyneuropathy associated with diabetes mellitus due to underlying condition (HCC)   Anxiety, generalized   COPD with acute exacerbation (HCC)   Seizures (HCC)   CAP (community acquired pneumonia)   Chronic respiratory failure with hypoxia (HCC)   Gastrostomy status (HCC)  Acute on chronic hypoxemic respiratory failure suspect secondary to aspiration pneumonia -Continue Unasyn  Mild COPD exacerbation secondary to above -Continue as needed DuoNebs -Pulmicort twice daily -Hold systemic steroids for now  Acute hypercapnic encephalopathy -Improving on BiPAP -Due to prior history of CVA, check head CT -Related to respiratory issues in the setting of dementia -Hold baclofen, Seroquel, and Lexapro for now  Hypertension-stable -Continue home propranolol  History of CAD -Continue aspirin and Plavix as well as statin and Tricor  Type 2 diabetes -SSI -We will be on pured diet  Dementia -Continue home Aricept  History of CVA with left-sided weakness -Has history of dysphagia and prior PEG tube placement -Currently on pured diet per family members -Hold tube feedings for now and attempt pured diet with SLP evaluation -Appears to have had prior seizures as well and will be continued on Tegretol  Insomnia -Seroquel 200 mg at bedtime, hold for now due to altered mentation and lethargy  Ongoing tobacco abuse -Counseled on cessation   DVT prophylaxis: Heparin Code Status: Full Family Communication: Daughter and Mom at bedside Disposition Plan:Admit for aspiration/hypoxemia Consults called:None Admission status: Inpatient, SDU  Severity of Illness: The appropriate patient status for this patient is INPATIENT. Inpatient status is judged to be reasonable and necessary in order to provide the required intensity of service to ensure the patient's safety. The  patient's presenting symptoms, physical exam findings, and initial radiographic and laboratory data in the context of their chronic comorbidities is felt to place them at high risk for further clinical deterioration. Furthermore, it is not anticipated that the patient will be medically stable for discharge from the hospital within 2 midnights of admission.   * I certify that at the point of admission it is my clinical judgment that the patient will require inpatient hospital care spanning beyond 2 midnights from the point of admission due to high intensity of service, high risk for further deterioration and high frequency of surveillance required.*   Parthiv Mucci D Azaria Bartell DO [facility]  If 7PM-7AM, please contact night-coverage [email or web address]  09/27/2021, 3:13 PM

## 2021-09-27 NOTE — Progress Notes (Signed)
Patient states he does not need BiPAP, wants it removed.  Patient placed on 2 liters cannula.

## 2021-09-27 NOTE — ED Provider Notes (Signed)
Emergency Department Provider Note   I have reviewed the triage vital signs and the nursing notes.   HISTORY  Chief Complaint Respiratory Distress   HPI Brent [name] is a 56 y.o. male with complex past history including pneumonitis from aspiration, prior stroke, diabetes, hypertension presents with acute respiratory failure and hypoxemia with EMS.  He is from a skilled nursing center and apparently became more altered in the past hour.  No clear last normal established.  EMS arrived to find the patient hypoxic and coughing up thick, green sputum.  They witnessed some episodes of apnea when lying flat but patient more comfortable sitting upright.  He was started on 15 L nonrebreather and transported to the ED.  O2 sats and mental status unchanged in route.   Patient denies any discomfort in his chest.  He is not having headache.  He does not appreciate any new numbness or weakness although has had strokes in the past with prior deficits on the left which are residual.  He is complaining of some lower back pain but chronicity regarding this is unclear.    Past Medical History:  Diagnosis Date   acute posthemorrhagic anemia    Anxiety    Aplastic anemia, unspecified (HCC)    Bipolar disorder (HCC)    Chest pain, unspecified    Chronic airway obstruction, not elsewhere classified    Cocaine substance abuse (HCC)    Dysphagia    Gastrostomy status (HCC)    Hyperlipidemia    Mood disorder (HCC)    Neuropathy    Other and unspecified hyperlipidemia    Pneumonitis due to inhalation of food and vomit (HCC)    Polyneuropathy    Shortness of breath    Stroke (HCC) 06/17/2020   Tobacco use disorder    Type II or unspecified type diabetes mellitus without mention of complication, not stated as uncontrolled    Unspecified epilepsy without mention of intractable epilepsy    Unspecified essential hypertension    Unspecified protein-calorie malnutrition (HCC)     Review of  Systems  Constitutional: No fever/chills Cardiovascular: Denies chest pain. Respiratory: Positive shortness of breath and cough.  Gastrointestinal: No abdominal pain.  No nausea, no vomiting.  No diarrhea.  Genitourinary: Negative for dysuria. Musculoskeletal: Positive for back pain. Skin: Negative for rash. Neurological: Negative for headaches.   ____________________________________________   PHYSICAL EXAM:  VITAL SIGNS: Vitals:   09/27/21 1330 09/27/21 1415  BP: (!) 117/95 115/77  Pulse: 64 (!) 51  Resp: 16 16  Temp:    SpO2: 100% 100%    Constitutional: Alert and able to provide a brief history.  Eyes: Conjunctivae are normal. Pupils are equal (3 mm).  Head: Atraumatic. Nose: No congestion/rhinnorhea. Mouth/Throat: Mucous membranes are moist.   Neck: No stridor.   Cardiovascular: Normal rate, regular rhythm. Good peripheral circulation. Grossly normal heart sounds.   Respiratory: Increased respiratory effort.  No retractions. Lungs without significant wheezing.  Patient with rhonchi at the bases. Gastrointestinal: Soft and nontender. No distention.  Musculoskeletal: No lower extremity tenderness nor edema. No gross deformities of extremities. Neurologic:  Normal speech and language. No gross focal neurologic deficits are appreciated.  Skin:  Skin is warm, dry and intact. No rash noted.  ____________________________________________   LABS (all labs ordered are listed, but only abnormal results are displayed)  Labs Reviewed  COMPREHENSIVE METABOLIC PANEL - Abnormal; Notable for the following components:      Result Value   Glucose, Bld 253 (*)  Creatinine, Ser 1.32 (*)    Total Protein 8.7 (*)    Albumin 3.4 (*)    AST 12 (*)    Total Bilirubin 0.2 (*)    All other components within normal limits  CBC WITH DIFFERENTIAL/PLATELET - Abnormal; Notable for the following components:   Hemoglobin 11.9 (*)    HCT 38.1 (*)    All other components within normal  limits  BLOOD GAS, VENOUS - Abnormal; Notable for the following components:   pCO2, Ven 66 (*)    pO2, Ven <31 (*)    Bicarbonate 31.0 (*)    All other components within normal limits  URINALYSIS, ROUTINE W REFLEX MICROSCOPIC - Abnormal; Notable for the following components:   Glucose, UA >=500 (*)    Protein, ur 100 (*)    All other components within normal limits  RESP PANEL BY RT-PCR (FLU A&B, COVID) ARPGX2  CULTURE, BLOOD (ROUTINE X 2)  CULTURE, BLOOD (ROUTINE X 2)  BRAIN NATRIURETIC PEPTIDE  TROPONIN I (HIGH SENSITIVITY)  TROPONIN I (HIGH SENSITIVITY)   ____________________________________________  EKG   EKG Interpretation  Date/Time:  Saturday September 27 2021 12:53:39 EDT Ventricular Rate:  77 PR Interval:  298 QRS Duration: 93 QT Interval:  427 QTC Calculation: 484 R Axis:   53 Text Interpretation: Sinus rhythm Prolonged PR interval Borderline T abnormalities, anterior leads Borderline prolonged QT interval Confirmed by Nanda Quinton [phone]) on 09/27/2021 1:00:29 PM        ____________________________________________  RADIOLOGY  DG Chest Portable 1 View  Result Date: 09/27/2021 CLINICAL DATA:  Shortness of breath. EXAM: PORTABLE CHEST 1 VIEW COMPARISON:  07/02/2021 and prior studies FINDINGS: The cardiomediastinal silhouette is unremarkable. There is significantly improved aeration of the LEFT lung some residual atelectasis/consolidation within the MEDIAL LEFT LOWER lung noted. No large pleural effusion or pneumothorax noted. No acute bony abnormalities are identified. IMPRESSION: Significantly improved LEFT lung aeration since 07/02/2021 with some residual atelectasis/consolidation within the MEDIAL LEFT LOWER lung. No other significant change. Electronically Signed   By: Margarette Canada M.D.   On: 09/27/2021 13:38    ____________________________________________   PROCEDURES  Procedure(s) performed:   .Critical Care  Performed by: Margette Fast, MD Authorized  by: Margette Fast, MD   Critical care provider statement:    Critical care time (minutes):  35   Critical care time was exclusive of:  Separately billable procedures and treating other patients and teaching time   Critical care was necessary to treat or prevent imminent or life-threatening deterioration of the following conditions:  Respiratory failure   Critical care was time spent personally by me on the following activities:  Development of treatment plan with patient or surrogate, discussions with consultants, evaluation of patient's response to treatment, examination of patient, ordering and review of laboratory studies, ordering and review of radiographic studies, ordering and performing treatments and interventions, pulse oximetry, re-evaluation of patient's condition, review of old charts and obtaining history from patient or surrogate   I assumed direction of critical care for this patient from another provider in my specialty: no     Care discussed with: admitting provider      ____________________________________________   INITIAL IMPRESSION / [place] / ED COURSE  Pertinent labs & imaging results that were available during my care of the patient were reviewed by me and considered in my medical decision making (see chart for details).   This patient is Presenting for Evaluation of AMS, which does require a range of  treatment options, and is a complaint that involves a high risk of morbidity and mortality.  The Differential Diagnoses includes but is not exclusive to alcohol, illicit or prescription medications, intracranial pathology such as stroke, intracerebral hemorrhage, fever or infectious causes including sepsis, hypoxemia, uremia, trauma, endocrine related disorders such as diabetes, hypoglycemia, thyroid-related diseases, etc.   Critical Interventions-    Medications  Ampicillin-Sulbactam (UNASYN) 3 g in sodium chloride 0.9 % 100 mL IVPB (has no administration  in time range)  ondansetron (ZOFRAN) injection 4 mg (4 mg Intravenous Given 09/27/21 1313)  sodium chloride 0.9 % bolus 1,000 mL ( Intravenous Stopped 09/27/21 1416)    Reassessment after intervention:  Patient looking much improved, awake, alert on BiPAP.    I did obtain Additional Historical Information from EMS.   I decided to review pertinent External Data, and in summary paperwork from the patient's facility identifies him as FULL CODE.   Clinical Laboratory Tests Ordered, included VBG with slight respiratory acidosis with pH of 7.27 and CO2 of 66.  No evidence of urinary tract infection, COVID, flu.  Troponin normal.  BNP normal.  No severe anemia.  Patient with renal insufficiency and hyperglycemia but no evidence of DKA.  Radiologic Tests Ordered, included CXR. I independently interpreted the images and agree with radiology interpretation.   Cardiac Monitor Tracing which shows NSR.   Social Determinants of Health Risk patient has a smoking history.   Consult complete with Hospitalist, plan for admit.   Medical Decision Making: Summary:  Patient presents emergency department evaluation of altered mental status with shortness of breath, productive cough, hypoxemia.  Doubt primary neuro etiology.  Patient has no appreciable deficits on my evaluation including strength/sensation.  Is not experiencing severe headache.  He was transitioned to BiPAP on arrival and is tolerating this well.  Has history of aspiration pneumonitis, which is higher on my differential.  He does not appear acutely volume overloaded to suggest flash edema.   Reevaluation with update and discussion with patient and family now at bedside (01:50 PM).  He is looking much better.  He is awake and alert.  He is able to provide much additional history.  Family note that over the past week he seemed more confused and "lethargic."  They confirm he has weakness on the left at baseline.  Today, he was much more short of  breath, hypoxic, much less responsive but note he seems much better at this point.   Disposition: admit  ____________________________________________  FINAL CLINICAL IMPRESSION(S) / ED DIAGNOSES  Final diagnoses:  Acute respiratory failure with hypoxia (HCC)    Note:  This document was prepared using Dragon voice recognition software and may include unintentional dictation errors.  Alona Bene, MD, [facility] Emergency Medicine    Rorik Vespa, Arlyss Repress, MD 09/27/21 [phone]

## 2021-09-28 DIAGNOSIS — J9601 Acute respiratory failure with hypoxia: Secondary | ICD-10-CM | POA: Diagnosis not present

## 2021-09-28 LAB — CBC
HCT: 30.7 % — ABNORMAL LOW (ref 39.0–52.0)
Hemoglobin: 9.6 g/dL — ABNORMAL LOW (ref 13.0–17.0)
MCH: 28.2 pg (ref 26.0–34.0)
MCHC: 31.3 g/dL (ref 30.0–36.0)
MCV: 90 fL (ref 80.0–100.0)
Platelets: 286 10*3/uL (ref 150–400)
RBC: 3.41 MIL/uL — ABNORMAL LOW (ref 4.22–5.81)
RDW: 13.3 % (ref 11.5–15.5)
WBC: 7.9 10*3/uL (ref 4.0–10.5)
nRBC: 0 % (ref 0.0–0.2)

## 2021-09-28 LAB — BASIC METABOLIC PANEL
Anion gap: 6 (ref 5–15)
BUN: 16 mg/dL (ref 6–20)
CO2: 28 mmol/L (ref 22–32)
Calcium: 9.1 mg/dL (ref 8.9–10.3)
Chloride: 109 mmol/L (ref 98–111)
Creatinine, Ser: 1.01 mg/dL (ref 0.61–1.24)
GFR, Estimated: >60 mL/min (ref >60)
Glucose, Bld: 114 mg/dL — ABNORMAL HIGH (ref 70–99)
Potassium: 3.8 mmol/L (ref 3.5–5.1)
Sodium: 143 mmol/L (ref 135–145)

## 2021-09-28 LAB — MRSA NEXT GEN BY PCR, NASAL: MRSA by PCR Next Gen: DETECTED — AB

## 2021-09-28 LAB — GLUCOSE, CAPILLARY
Glucose-Capillary: 151 mg/dL — ABNORMAL HIGH (ref 70–99)
Glucose-Capillary: 152 mg/dL — ABNORMAL HIGH (ref 70–99)
Glucose-Capillary: 158 mg/dL — ABNORMAL HIGH (ref 70–99)

## 2021-09-28 LAB — MAGNESIUM: Magnesium: 1.4 mg/dL — ABNORMAL LOW (ref 1.7–2.4)

## 2021-09-28 MED ORDER — ALPRAZOLAM 1 MG PO TABS: 1.0000 mg | ORAL_TABLET | Freq: Two times a day (BID) | ORAL | Status: DC | PRN

## 2021-09-28 MED ORDER — BACLOFEN 10 MG PO TABS
10.0000 mg | ORAL_TABLET | Freq: Four times a day (QID) | ORAL | Status: DC
  Administered 2021-09-28 – 2021-09-29 (×5): 10 mg
  Filled 2021-09-28 (×6): qty 1

## 2021-09-28 MED ORDER — QUETIAPINE FUMARATE 100 MG PO TABS
100.0000 mg | ORAL_TABLET | Freq: Every day | ORAL | Status: DC
  Administered 2021-09-28: 100 mg
  Filled 2021-09-28: qty 1

## 2021-09-28 MED ORDER — LEVETIRACETAM 100 MG/ML PO SOLN
500.0000 mg | Freq: Two times a day (BID) | ORAL | Status: DC
Start: 2021-09-28 — End: 2021-09-28

## 2021-09-28 MED ORDER — BACLOFEN 10 MG PO TABS: 10.0000 mg | ORAL_TABLET | Freq: Four times a day (QID) | ORAL | Status: DC

## 2021-09-28 MED ORDER — HYDROCODONE-ACETAMINOPHEN 5-325 MG PO TABS
1.0000 | ORAL_TABLET | Freq: Four times a day (QID) | ORAL | Status: DC | PRN
  Administered 2021-09-28 – 2021-09-29 (×4): 1 via ORAL
  Filled 2021-09-28 (×5): qty 1

## 2021-09-28 MED ORDER — MAGNESIUM SULFATE 2 GM/50ML IV SOLN
2.0000 g | Freq: Once | INTRAVENOUS | Status: AC
  Administered 2021-09-28: 2 g via INTRAVENOUS
  Filled 2021-09-28: qty 50

## 2021-09-28 MED ORDER — ESCITALOPRAM OXALATE 10 MG PO TABS
10.0000 mg | ORAL_TABLET | Freq: Every day | ORAL | Status: DC
  Administered 2021-09-28 – 2021-09-29 (×2): 10 mg
  Filled 2021-09-28 (×2): qty 1

## 2021-09-28 NOTE — Progress Notes (Signed)
Lab reported patient is positive for MRSA. MD informed.

## 2021-09-28 NOTE — Progress Notes (Addendum)
PROGRESS NOTE    Brent [name]  XKG:[id number] DOB: 06-26-1965 DOA: 09/27/2021 PCP: [name], MD   Brief Narrative:    Brent [name] is a 56 y.o. male with medical history significant for COPD with chronic hypoxemia, recurrent aspiration pneumonia in the setting of dysphagia with PEG tube placement, hypertension, CAD, type 2 diabetes, prior CVA, and insomnia who presented to the emergency department via his SNF with some slurred speech and lethargy that has been apparently worsening over the course of the last 1 week.  Assessment & Plan:   Principal Problem:   Acute hypoxemic respiratory failure (HCC) Active Problems:   GERD (gastroesophageal reflux disease)   Cigarette smoker   Diabetic polyneuropathy associated with diabetes mellitus due to underlying condition (HCC)   Anxiety, generalized   COPD with acute exacerbation (HCC)   Seizures (HCC)   CAP (community acquired pneumonia)   Chronic respiratory failure with hypoxia (HCC)   Gastrostomy status (HCC)  Assessment and Plan:  Acute on chronic hypoxemic respiratory failure suspect secondary to aspiration pneumonia -Continue Unasyn -Blood cultures with no noted growth -Anticipate discharge with Augmentin by a.m. if he continues to remain improved   Mild COPD exacerbation secondary to above -Continue as needed DuoNebs -Pulmicort twice daily -Hold systemic steroids for now   Acute hypercapnic encephalopathy -Improving on BiPAP -Due to prior history of CVA, head CT with no acute findings -Related to respiratory issues in the setting of dementia -Zoom home baclofen, Seroquel and Lexapro   Hypertension-stable -Continue home propranolol   History of CAD -Continue aspirin and Plavix as well as statin and Tricor   Type 2 diabetes -SSI -We will be on pured diet   Dementia -Continue home Aricept   History of CVA with left-sided weakness -Has history of dysphagia and prior PEG tube placement -Currently on  pured diet per family members -Hold tube feedings for now and attempt pured diet with SLP evaluation -Appears to have had prior seizures as well and will be continued on Tegretol   Insomnia -Seroquel 200 mg at bedtime to resume today   Ongoing tobacco abuse -Counseled on cessation  Hypomagnesemia -Replete and reevaluate in a.m.   DVT prophylaxis: Heparin Code Status: Full Family Communication: Spoke with mom and daughter at bedside 8/12 Disposition Plan:  Status is: Inpatient Remains inpatient appropriate because: Need for IV medications   Consultants:  None  Procedures:  None  Antimicrobials:  Anti-infectives (From admission, onward)    Start     Dose/Rate Route Frequency Ordered Stop   09/27/21 1445  Ampicillin-Sulbactam (UNASYN) 3 g in sodium chloride 0.9 % 100 mL IVPB        3 g 200 mL/hr over 30 Minutes Intravenous Every 6 hours 09/27/21 1431         Subjective: Patient seen and evaluated today with no new acute complaints or concerns. No acute concerns or events noted overnight.  He is off BiPAP and is more alert and oriented this morning.  Objective: Vitals:   09/28/21 0500 09/28/21 0600 09/28/21 0738 09/28/21 0857  BP: 123/66 124/71    Pulse: 60 (!) 59    Resp: 20 20 15    Temp:   98.1 F (36.7 C)   TempSrc:   Oral   SpO2: 92% 94%  95%  Weight:  88.4 kg    Height:        Intake/Output Summary (Last 24 hours) at 09/28/2021 1048 Last data filed at 09/28/2021 09/30/2021 Gross per 24 hour  Intake  2009.25 ml  Output 500 ml  Net 1509.25 ml   Filed Weights   09/27/21 1250 09/27/21 1845 09/28/21 0600  Weight: 88 kg 84.3 kg 88.4 kg    Examination:  General exam: Appears calm and comfortable  Respiratory system: Clear to auscultation. Respiratory effort normal.  Currently on nasal cannula oxygen Cardiovascular system: S1 & S2 heard, RRR.  Gastrointestinal system: Abdomen is soft, feeding tube C/D/I Central nervous system: Alert and awake Extremities:  No edema Skin: No significant lesions noted Psychiatry: Flat affect.    Data Reviewed: I have personally reviewed following labs and imaging studies  CBC: Recent Labs  Lab 09/27/21 1245 09/28/21 0346  WBC 10.2 7.9  NEUTROABS 7.3  --   HGB 11.9* 9.6*  HCT 38.1* 30.7*  MCV 90.3 90.0  PLT 364 286   Basic Metabolic Panel: Recent Labs  Lab 09/27/21 1245 09/28/21 0346  NA 142 143  K 4.2 3.8  CL 105 109  CO2 29 28  GLUCOSE 253* 114*  BUN 16 16  CREATININE 1.32* 1.01  CALCIUM 9.8 9.1  MG  --  1.4*   GFR: Estimated Creatinine Clearance: 89.6 mL/min (by C-G formula based on SCr of 1.01 mg/dL). Liver Function Tests: Recent Labs  Lab 09/27/21 1245  AST 12*  ALT 9  ALKPHOS 79  BILITOT 0.2*  PROT 8.7*  ALBUMIN 3.4*   No results for input(s): "LIPASE", "AMYLASE" in the last 168 hours. No results for input(s): "AMMONIA" in the last 168 hours. Coagulation Profile: No results for input(s): "INR", "PROTIME" in the last 168 hours. Cardiac Enzymes: No results for input(s): "CKTOTAL", "CKMB", "CKMBINDEX", "TROPONINI" in the last 168 hours. BNP (last 3 results) No results for input(s): "PROBNP" in the last 8760 hours. HbA1C: Recent Labs    09/27/21 1245  HGBA1C 8.7*   CBG: Recent Labs  Lab 09/27/21 1712 09/27/21 2122  GLUCAP 184* 126*   Lipid Profile: No results for input(s): "CHOL", "HDL", "LDLCALC", "TRIG", "CHOLHDL", "LDLDIRECT" in the last 72 hours. Thyroid Function Tests: No results for input(s): "TSH", "T4TOTAL", "FREET4", "T3FREE", "THYROIDAB" in the last 72 hours. Anemia Panel: No results for input(s): "VITAMINB12", "FOLATE", "FERRITIN", "TIBC", "IRON", "RETICCTPCT" in the last 72 hours. Sepsis Labs: No results for input(s): "PROCALCITON", "LATICACIDVEN" in the last 168 hours.  Recent Results (from the past 240 hour(s))  Resp Panel by RT-PCR (Flu A&B, Covid) Anterior Nasal Swab     Status: None   Collection Time: 09/27/21 12:49 PM   Specimen: Anterior  Nasal Swab  Result Value Ref Range Status   SARS Coronavirus 2 by RT PCR NEGATIVE NEGATIVE Final    Comment: (NOTE) SARS-CoV-2 target nucleic acids are NOT DETECTED.  The SARS-CoV-2 RNA is generally detectable in upper respiratory specimens during the acute phase of infection. The lowest concentration of SARS-CoV-2 viral copies this assay can detect is 138 copies/mL. A negative result does not preclude SARS-Cov-2 infection and should not be used as the sole basis for treatment or other patient management decisions. A negative result may occur with  improper specimen collection/handling, submission of specimen other than nasopharyngeal swab, presence of viral mutation(s) within the areas targeted by this assay, and inadequate number of viral copies(<138 copies/mL). A negative result must be combined with clinical observations, patient history, and epidemiological information. The expected result is Negative.  Fact Sheet for Patients:  BloggerCourse.com  Fact Sheet for Healthcare Providers:  SeriousBroker.it  This test is no t yet approved or cleared by the [place] and  has been authorized for detection and/or diagnosis of SARS-CoV-2 by FDA under an Emergency Use Authorization (EUA). This EUA will remain  in effect (meaning this test can be used) for the duration of the COVID-19 declaration under Section 564(b)(1) of the Act, 21 U.S.C.section 360bbb-3(b)(1), unless the authorization is terminated  or revoked sooner.       Influenza A by PCR NEGATIVE NEGATIVE Final   Influenza B by PCR NEGATIVE NEGATIVE Final    Comment: (NOTE) The Xpert Xpress SARS-CoV-2/FLU/RSV plus assay is intended as an aid in the diagnosis of influenza from Nasopharyngeal swab specimens and should not be used as a sole basis for treatment. Nasal washings and aspirates are unacceptable for Xpert Xpress SARS-CoV-2/FLU/RSV testing.  Fact Sheet for  Patients: BloggerCourse.com  Fact Sheet for Healthcare Providers: SeriousBroker.it  This test is not yet approved or cleared by the [place] FDA and has been authorized for detection and/or diagnosis of SARS-CoV-2 by FDA under an Emergency Use Authorization (EUA). This EUA will remain in effect (meaning this test can be used) for the duration of the COVID-19 declaration under Section 564(b)(1) of the Act, 21 U.S.C. section 360bbb-3(b)(1), unless the authorization is terminated or revoked.  Performed at [facility], [address], [place], [place] [address]   Culture, blood (routine x 2)     Status: None (Preliminary result)   Collection Time: 09/27/21  3:03 PM   Specimen: BLOOD  Result Value Ref Range Status   Specimen Description BLOOD  Final   Special Requests BLOOD  Final   Culture   Final    NO GROWTH < 12 HOURS Performed at [facility], [address] [place], [place] [address]    Report Status PENDING  Incomplete  Culture, blood (routine x 2)     Status: None (Preliminary result)   Collection Time: 09/27/21  3:03 PM   Specimen: BLOOD  Result Value Ref Range Status   Specimen Description BLOOD  Final   Special Requests BLOOD  Final   Culture   Final    NO GROWTH < 12 HOURS Performed at [facility], [address], [place], [place] [address]    Report Status PENDING  Incomplete         Radiology Studies: CT HEAD WO CONTRAST ( )  Result Date: 09/27/2021 CLINICAL DATA:  Mental status change, unknown cause EXAM: CT HEAD WITHOUT CONTRAST TECHNIQUE: Contiguous axial images were obtained from the base of the skull through the vertex without intravenous contrast. RADIATION DOSE REDUCTION: This exam was performed according to the departmental dose-optimization program which includes automated exposure control, adjustment of the mA and/or kV according to patient size and/or use of iterative reconstruction technique.  COMPARISON:  10/21/2020 FINDINGS: Brain: No evidence of acute infarction, hemorrhage, hydrocephalus, extra-axial collection or mass lesion/mass effect. Chronic right frontal lobe infarct. Extensive low-density changes within the periventricular and subcortical white matter compatible with chronic microvascular ischemic change. Mild diffuse cerebral volume loss. Vascular: Atherosclerotic calcifications involving the large vessels of the skull base. No unexpected hyperdense vessel. Skull: Normal. Negative for fracture or focal lesion. Sinuses/Orbits: No acute finding. Other: None. IMPRESSION: 1. No acute intracranial abnormality by CT. 2. Chronic right frontal lobe infarct. 3. Chronic microvascular ischemic change and cerebral volume loss. Electronically Signed   By: Duanne Guess D.O.   On: 09/27/2021 16:15   DG Chest Portable 1 View  Result Date: 09/27/2021 CLINICAL DATA:  Shortness of breath. EXAM: PORTABLE CHEST 1 VIEW COMPARISON:  07/02/2021 and prior studies  FINDINGS: The cardiomediastinal silhouette is unremarkable. There is significantly improved aeration of the LEFT lung some residual atelectasis/consolidation within the MEDIAL LEFT LOWER lung noted. No large pleural effusion or pneumothorax noted. No acute bony abnormalities are identified. IMPRESSION: Significantly improved LEFT lung aeration since 07/02/2021 with some residual atelectasis/consolidation within the MEDIAL LEFT LOWER lung. No other significant change. Electronically Signed   By: Harmon Pier M.D.   On: 09/27/2021 13:38        Scheduled Meds:  aspirin  81 mg Oral Daily   budesonide (PULMICORT) nebulizer solution  0.25 mg Nebulization BID   Chlorhexidine Gluconate Cloth  6 each Topical Q0600   clopidogrel  75 mg Oral Daily   donepezil  10 mg Oral QHS   fenofibrate  160 mg Oral Daily   fluticasone  1 spray Each Nare Daily   free water  230 mL Per Tube QID   heparin  5,000 Units Subcutaneous Q8H   insulin aspart  0-5 Units  Subcutaneous QHS   insulin aspart  0-9 Units Subcutaneous TID WC   insulin glargine-yfgn  6 Units Subcutaneous BID   levETIRAcetam  500 mg Per Tube BID   polyethylene glycol  17 g Per Tube Daily   propranolol  10 mg Per Tube BID   rosuvastatin  20 mg Per Tube q1800   Continuous Infusions:  ampicillin-sulbactam (UNASYN) IV 3 g (09/28/21 1014)     LOS: 1 day    Time spent: 35 minutes    [place] Jacquin Hoover Brunette, DO [facility]  If 7PM-7AM, please contact night-coverage [email or web address] 09/28/2021, 10:48 AM

## 2021-09-29 DIAGNOSIS — J9601 Acute respiratory failure with hypoxia: Secondary | ICD-10-CM | POA: Diagnosis not present

## 2021-09-29 LAB — BASIC METABOLIC PANEL
Anion gap: 7 (ref 5–15)
BUN: 15 mg/dL (ref 6–20)
CO2: 27 mmol/L (ref 22–32)
Calcium: 9.3 mg/dL (ref 8.9–10.3)
Chloride: 106 mmol/L (ref 98–111)
Creatinine, Ser: 0.76 mg/dL (ref 0.61–1.24)
GFR, Estimated: >60 mL/min (ref >60)
Glucose, Bld: 124 mg/dL — ABNORMAL HIGH (ref 70–99)
Potassium: 3.7 mmol/L (ref 3.5–5.1)
Sodium: 140 mmol/L (ref 135–145)

## 2021-09-29 LAB — CBC
HCT: 27.5 % — ABNORMAL LOW (ref 39.0–52.0)
Hemoglobin: 8.7 g/dL — ABNORMAL LOW (ref 13.0–17.0)
MCH: 28.2 pg (ref 26.0–34.0)
MCHC: 31.6 g/dL (ref 30.0–36.0)
MCV: 89.3 fL (ref 80.0–100.0)
Platelets: 301 10*3/uL (ref 150–400)
RBC: 3.08 MIL/uL — ABNORMAL LOW (ref 4.22–5.81)
RDW: 13.2 % (ref 11.5–15.5)
WBC: 8.5 10*3/uL (ref 4.0–10.5)
nRBC: 0 % (ref 0.0–0.2)

## 2021-09-29 LAB — RETICULOCYTES
Immature Retic Fract: 17.5 % — ABNORMAL HIGH (ref 2.3–15.9)
RBC.: 3.65 MIL/uL — ABNORMAL LOW (ref 4.22–5.81)
Retic Count, Absolute: 51.8 10*3/uL (ref 19.0–186.0)
Retic Ct Pct: 1.4 % (ref 0.4–3.1)

## 2021-09-29 LAB — HEMOGLOBIN AND HEMATOCRIT, BLOOD
HCT: 31.9 % — ABNORMAL LOW (ref 39.0–52.0)
Hemoglobin: 10 g/dL — ABNORMAL LOW (ref 13.0–17.0)

## 2021-09-29 LAB — VITAMIN B12: Vitamin B-12: 418 pg/mL (ref 180–914)

## 2021-09-29 LAB — GLUCOSE, CAPILLARY
Glucose-Capillary: 121 mg/dL — ABNORMAL HIGH (ref 70–99)
Glucose-Capillary: 103 mg/dL — ABNORMAL HIGH (ref 70–99)

## 2021-09-29 LAB — IRON AND TIBC
Iron: 33 ug/dL — ABNORMAL LOW (ref 45–182)
Saturation Ratios: 11 % — ABNORMAL LOW (ref 17.9–39.5)
TIBC: 302 ug/dL (ref 250–450)
UIBC: 269 ug/dL

## 2021-09-29 LAB — FERRITIN: Ferritin: 69 ng/mL (ref 24–336)

## 2021-09-29 LAB — MAGNESIUM: Magnesium: 1.7 mg/dL (ref 1.7–2.4)

## 2021-09-29 LAB — FOLATE: Folate: 17 ng/mL (ref >5.9)

## 2021-09-29 MED ORDER — ALPRAZOLAM 0.5 MG PO TABS: 1.0000 mg | ORAL_TABLET | Freq: Two times a day (BID) | ORAL | 0 refills | Status: DC | PRN

## 2021-09-29 MED ORDER — HYDROCODONE-ACETAMINOPHEN 5-325 MG PO TABS: 1.0000 | ORAL_TABLET | Freq: Four times a day (QID) | ORAL | 0 refills | Status: DC | PRN

## 2021-09-29 MED ORDER — AMOXICILLIN-POT CLAVULANATE 250-62.5 MG/5ML PO SUSR: 500.0000 mg | Freq: Two times a day (BID) | ORAL | 0 refills | Status: AC

## 2021-09-29 NOTE — Discharge Summary (Signed)
Physician Discharge Summary  Brent [name] [id number] DOB: 1965/09/06 DOA: 09/27/2021  PCP: Caprice Renshaw, MD  Admit date: 09/27/2021  Discharge date: 09/29/2021  Admitted From:SNF  Disposition:  SNF  Recommendations for Outpatient Follow-up:  Follow up with PCP in 1-2 weeks Continue Augmentin as prescribed for 5 more days to complete total 7-day course of treatment for suspected aspiration pneumonia Continue home medications as noted below  Home Health: None  Equipment/Devices: None  Discharge Condition:Stable  CODE STATUS: Full  Diet recommendation: Dysphagia 2 with pudding thick liquids  Brief/Interim Summary:  Brent [name] is a 56 y.o. male with medical history significant for COPD with chronic hypoxemia, recurrent aspiration pneumonia in the setting of dysphagia with PEG tube placement, hypertension, CAD, type 2 diabetes, prior CVA, and insomnia who presented to the emergency department via his SNF with some slurred speech and lethargy that has been apparently worsening over the course of the last 1 week.  He was admitted with acute on chronic hypoxemic respiratory failure secondary to aspiration pneumonia and was started on IV Unasyn.  He was also noted to have very mild COPD exacerbation that quickly resolved.  He also had initial acute hypercapnic encephalopathy with elevated PCO2 levels and need for BiPAP that also quickly resolved.  He will not be discharged on liquid Augmentin via his PEG tube and has been seen by SLP with recommendations for dysphagia 2 diet and thickened liquids as noted above.  No other acute events or concerns noted throughout the course of this admission and he is stable for discharge.  Discharge Diagnoses:  Principal Problem:   Acute hypoxemic respiratory failure (HCC) Active Problems:   GERD (gastroesophageal reflux disease)   Cigarette smoker   Diabetic polyneuropathy associated with diabetes mellitus due to underlying condition (HCC)    Anxiety, generalized   COPD with acute exacerbation (Brent [name])   Seizures (HCC)   CAP (community acquired pneumonia)   Chronic respiratory failure with hypoxia (Brent [name])   Gastrostomy status (Brent [name])  Plans we will discharge diagnosis: Acute on chronic hypoxemic respiratory failure secondary to aspiration pneumonia with associated mild COPD exacerbation and acute hypercapnic encephalopathy.  Discharge Instructions  Discharge Instructions     Diet - low sodium heart healthy   Complete by: As directed    Increase activity slowly   Complete by: As directed       Allergies as of 09/29/2021   No Known Allergies      Medication List     TAKE these medications    albuterol 108 (90 Base) MCG/ACT inhaler Commonly known as: VENTOLIN HFA Inhale 2 puffs into the lungs every 6 (six) hours as needed for wheezing or shortness of breath.   ALPRAZolam 0.5 MG tablet Commonly known as: XANAX Take 2 tablets (1 mg total) by mouth 2 (two) times daily as needed for anxiety.   amoxicillin-clavulanate 250-62.5 MG/5ML suspension Commonly known as: AUGMENTIN Place 10 mLs (500 mg total) into feeding tube 2 (two) times daily for 5 days.   ascorbic acid 500 MG tablet Commonly known as: VITAMIN C Take 500 mg by mouth 2 (two) times daily.   aspirin EC 81 MG tablet Take 1 tablet by mouth daily. Feeding tube   baclofen 10 MG tablet Commonly known as: LIORESAL Take 10 mg by mouth 4 (four) times daily.   carBAMazepine 100 MG/5ML suspension Commonly known as: TEGRETOL Place 10 mLs (200 mg total) into feeding tube every 6 (six) hours.   clopidogrel 75 MG tablet Commonly known  as: PLAVIX Place 1 tablet into feeding tube daily.   Colchicine 0.6 MG Caps 1 capsule by Feeding Tube route in the morning and at bedtime.   donepezil 10 MG tablet Commonly known as: ARICEPT Take 10 mg by mouth at bedtime.   escitalopram 20 MG tablet Commonly known as: LEXAPRO Place 10 mg into feeding tube daily.    fenofibrate 160 MG tablet Take 1 tablet by mouth daily.   fluticasone 50 MCG/ACT nasal spray Commonly known as: FLONASE Place 1 spray into both nostrils daily.   free water Soln Place 230 mLs into feeding tube 4 (four) times daily.   HYDROcodone-acetaminophen 5-325 MG tablet Commonly known as: NORCO/VICODIN Take 1 tablet by mouth every 6 (six) hours as needed for moderate pain. Feeding tube   insulin glargine 100 UNIT/ML injection Commonly known as: LANTUS Inject 6 Units into the skin 2 (two) times daily. For DM   Keppra 100 MG/ML solution Generic drug: levETIRAcetam Place 5 mLs into feeding tube 2 (two) times daily.   nitroGLYCERIN 0.4 MG SL tablet Commonly known as: NITROSTAT Place 1 tablet (0.4 mg total) under the tongue every 5 (five) minutes as needed for chest pain.   omeprazole 20 MG capsule Commonly known as: PRILOSEC 40 mg 2 (two) times daily before a meal.   polyethylene glycol 17 g packet Commonly known as: MIRALAX / GLYCOLAX Place 17 g into feeding tube daily.   propranolol 10 MG tablet Commonly known as: INDERAL Place 1 tablet (10 mg total) into feeding tube 2 (two) times daily.   QUEtiapine 100 MG tablet Commonly known as: SEROQUEL Place 1 tablet (100 mg total) into feeding tube at bedtime.   rosuvastatin 20 MG tablet Commonly known as: CRESTOR Place 1 tablet (20 mg total) into feeding tube daily.        Follow-up Information     [name], MD. Schedule an appointment as soon as possible for a visit in 1 week(s).   Specialty: Internal Medicine Contact information: [address] [place] [address] [phone]                No Known Allergies  Consultations: None   Procedures/Studies: CT HEAD WO CONTRAST ( )  Result Date: 09/27/2021 CLINICAL DATA:  Mental status change, unknown cause EXAM: CT HEAD WITHOUT CONTRAST TECHNIQUE: Contiguous axial images were obtained from the base of the skull through the vertex without  intravenous contrast. RADIATION DOSE REDUCTION: This exam was performed according to the departmental dose-optimization program which includes automated exposure control, adjustment of the mA and/or kV according to patient size and/or use of iterative reconstruction technique. COMPARISON:  10/21/2020 FINDINGS: Brain: No evidence of acute infarction, hemorrhage, hydrocephalus, extra-axial collection or mass lesion/mass effect. Chronic right frontal lobe infarct. Extensive low-density changes within the periventricular and subcortical white matter compatible with chronic microvascular ischemic change. Mild diffuse cerebral volume loss. Vascular: Atherosclerotic calcifications involving the large vessels of the skull base. No unexpected hyperdense vessel. Skull: Normal. Negative for fracture or focal lesion. Sinuses/Orbits: No acute finding. Other: None. IMPRESSION: 1. No acute intracranial abnormality by CT. 2. Chronic right frontal lobe infarct. 3. Chronic microvascular ischemic change and cerebral volume loss. Electronically Signed   By: Duanne Guess D.O.   On: 09/27/2021 16:15   DG Chest Portable 1 View  Result Date: 09/27/2021 CLINICAL DATA:  Shortness of breath. EXAM: PORTABLE CHEST 1 VIEW COMPARISON:  07/02/2021 and prior studies FINDINGS: The cardiomediastinal silhouette is unremarkable. There is significantly improved aeration of the  LEFT lung some residual atelectasis/consolidation within the MEDIAL LEFT LOWER lung noted. No large pleural effusion or pneumothorax noted. No acute bony abnormalities are identified. IMPRESSION: Significantly improved LEFT lung aeration since 07/02/2021 with some residual atelectasis/consolidation within the MEDIAL LEFT LOWER lung. No other significant change. Electronically Signed   By: Margarette Canada M.D.   On: 09/27/2021 13:38   DG Abdomen 1 View  Result Date: 09/11/2021 CLINICAL DATA:  Peg tube placement. EXAM: ABDOMEN - 1 VIEW COMPARISON:  Similar study 08/27/2021  FINDINGS: Unknown volume of enteric contrast injected through indwelling PEG tube. There is opacification of the gastric lumen. The tip projects within the gastric body. I do not see the expected outline of the balloon within the lumen. Do we know if of the balloon is fully inflated? The bowel pattern is nonobstructive.  Visceral shadows are stable. No supine evidence of free air. No visible perigastric contrast extravasation. IMPRESSION: Enteric PEG tube does project within the gastric body but the balloon is not well seen. Do we know if the balloon is fully inflated? There is no visible perigastric contrast extravasation. Contrast outlines the gastric folds. Electronically Signed   By: Telford Nab M.D.   On: 09/11/2021 22:15     Discharge Exam: Vitals:   09/29/21 0424 09/29/21 0706  BP: 122/75   Pulse: 62   Resp: 17   Temp: 98.8 F (37.1 C)   SpO2: 94% 95%   Vitals:   09/29/21 0002 09/29/21 0215 09/29/21 0424 09/29/21 0706  BP: 121/72  122/75   Pulse: (!) 59  62   Resp:   17   Temp:   98.8 F (37.1 C)   TempSrc:   Oral   SpO2:  99% 94% 95%  Weight:      Height:        General: Pt is alert, awake, not in acute distress Cardiovascular: RRR, S1/S2 +, no rubs, no gallops Respiratory: CTA bilaterally, no wheezing, no rhonchi Abdominal: Soft, NT, ND, bowel sounds +, gastric tube C/D/I Extremities: no edema, no cyanosis    The results of significant diagnostics from this hospitalization (including imaging, microbiology, ancillary and laboratory) are listed below for reference.     Microbiology: Recent Results (from the past 240 hour(s))  Resp Panel by RT-PCR (Flu A&B, Covid) Anterior Nasal Swab     Status: None   Collection Time: 09/27/21 12:49 PM   Specimen: Anterior Nasal Swab  Result Value Ref Range Status   SARS Coronavirus 2 by RT PCR NEGATIVE NEGATIVE Final    Comment: (NOTE) SARS-CoV-2 target nucleic acids are NOT DETECTED.  The SARS-CoV-2 RNA is generally  detectable in upper respiratory specimens during the acute phase of infection. The lowest concentration of SARS-CoV-2 viral copies this assay can detect is 138 copies/mL. A negative result does not preclude SARS-Cov-2 infection and should not be used as the sole basis for treatment or other patient management decisions. A negative result may occur with  improper specimen collection/handling, submission of specimen other than nasopharyngeal swab, presence of viral mutation(s) within the areas targeted by this assay, and inadequate number of viral copies(<138 copies/mL). A negative result must be combined with clinical observations, patient history, and epidemiological information. The expected result is Negative.  Fact Sheet for Patients:  EntrepreneurPulse.com.au  Fact Sheet for Healthcare Providers:  IncredibleEmployment.be  This test is no t yet approved or cleared by the [place] FDA and  has been authorized for detection and/or diagnosis of SARS-CoV-2 by FDA under an  Emergency Use Authorization (EUA). This EUA will remain  in effect (meaning this test can be used) for the duration of the COVID-19 declaration under Section 564(b)(1) of the Act, 21 U.S.C.section 360bbb-3(b)(1), unless the authorization is terminated  or revoked sooner.       Influenza A by PCR NEGATIVE NEGATIVE Final   Influenza B by PCR NEGATIVE NEGATIVE Final    Comment: (NOTE) The Xpert Xpress SARS-CoV-2/FLU/RSV plus assay is intended as an aid in the diagnosis of influenza from Nasopharyngeal swab specimens and should not be used as a sole basis for treatment. Nasal washings and aspirates are unacceptable for Xpert Xpress SARS-CoV-2/FLU/RSV testing.  Fact Sheet for Patients: EntrepreneurPulse.com.au  Fact Sheet for Healthcare Providers: IncredibleEmployment.be  This test is not yet approved or cleared by the [place] FDA  and has been authorized for detection and/or diagnosis of SARS-CoV-2 by FDA under an Emergency Use Authorization (EUA). This EUA will remain in effect (meaning this test can be used) for the duration of the COVID-19 declaration under Section 564(b)(1) of the Act, 21 U.S.C. section 360bbb-3(b)(1), unless the authorization is terminated or revoked.  Performed at [facility], [address], [place], [place] [address]   Culture, blood (routine x 2)     Status: None (Preliminary result)   Collection Time: 09/27/21  3:03 PM   Specimen: BLOOD  Result Value Ref Range Status   Specimen Description BLOOD  Final   Special Requests BLOOD  Final   Culture   Final    NO GROWTH 2 DAYS Performed at [facility], [address], [place], [place] [address]    Report Status PENDING  Incomplete  Culture, blood (routine x 2)     Status: None (Preliminary result)   Collection Time: 09/27/21  3:03 PM   Specimen: BLOOD  Result Value Ref Range Status   Specimen Description BLOOD  Final   Special Requests BLOOD  Final   Culture   Final    NO GROWTH 2 DAYS Performed at [facility], [address], [place], [place] [address]    Report Status PENDING  Incomplete  MRSA Next Gen by PCR, Nasal     Status: Abnormal   Collection Time: 09/27/21  6:56 PM   Specimen: Nasal Mucosa; Nasal Swab  Result Value Ref Range Status   MRSA by PCR Next Gen DETECTED (A) NOT DETECTED Final    Comment: RESULT CALLED TO, READ BACK BY AND VERIFIED WITH: WATSON,C @ [id number] ON 09/28/21 BY JUW (NOTE) The GeneXpert MRSA Assay (FDA approved for NASAL specimens only), is one component of a comprehensive MRSA colonization surveillance program. It is not intended to diagnose MRSA infection nor to guide or monitor treatment for MRSA infections. Test performance is not FDA approved in patients less than 35 years old. Performed at [facility], [address], [place], [place] [address]      Labs: BNP (last 3 results) Recent Labs     09/27/21 1245  BNP 0000000   Basic Metabolic Panel: Recent Labs  Lab 09/27/21 1245 09/28/21 0346 09/29/21 0502  NA 142 143 140  K 4.2 3.8 3.7  CL 105 109 106  CO2 29 28 27   GLUCOSE 253* 114* 124*  BUN 16 16 15   CREATININE 1.32* 1.01 0.76  CALCIUM 9.8 9.1 9.3  MG  --  1.4* 1.7   Liver Function Tests: Recent Labs  Lab 09/27/21 1245  AST 12*  ALT 9  ALKPHOS 79  BILITOT 0.2*  PROT 8.7*  ALBUMIN 3.4*   No results for input(s): "LIPASE", "AMYLASE" in the last 168 hours. No results for input(s): "AMMONIA" in the last 168 hours. CBC: Recent Labs  Lab 09/27/21 1245 09/28/21 0346 09/29/21 0502 09/29/21 1202  WBC 10.2 7.9 8.5  --   NEUTROABS 7.3  --   --   --   HGB 11.9* 9.6* 8.7* 10.0*  HCT 38.1* 30.7* 27.5* 31.9*  MCV 90.3 90.0 89.3  --   PLT 364 286 301  --    Cardiac Enzymes: No results for input(s): "CKTOTAL", "CKMB", "CKMBINDEX", "TROPONINI" in the last 168 hours. BNP: Invalid input(s): "POCBNP" CBG: Recent Labs  Lab 09/28/21 1116 09/28/21 1642 09/28/21 2223 09/29/21 0740 09/29/21 1124  GLUCAP 151* 152* 158* 121* 103*   D-Dimer No results for input(s): "DDIMER" in the last 72 hours. Hgb A1c Recent Labs    09/27/21 1245  HGBA1C 8.7*   Lipid Profile No results for input(s): "CHOL", "HDL", "LDLCALC", "TRIG", "CHOLHDL", "LDLDIRECT" in the last 72 hours. Thyroid function studies No results for input(s): "TSH", "T4TOTAL", "T3FREE", "THYROIDAB" in the last 72 hours.  Invalid input(s): "FREET3" Anemia work up Recent Labs    09/29/21 0755  VITAMINB12 418  FOLATE 17.0  FERRITIN 69  TIBC 302  IRON 33*  RETICCTPCT 1.4   Urinalysis    Component Value Date/Time   COLORURINE YELLOW 09/27/2021 [place] 09/27/2021 1346   LABSPEC 1.020 09/27/2021 1346   PHURINE 5.0 09/27/2021 1346   GLUCOSEU >=500 (A) 09/27/2021 1346   HGBUR NEGATIVE 09/27/2021 1346   BILIRUBINUR NEGATIVE 09/27/2021 1346   KETONESUR NEGATIVE 09/27/2021 1346    PROTEINUR 100 (A) 09/27/2021 1346   NITRITE NEGATIVE 09/27/2021 1346   LEUKOCYTESUR NEGATIVE 09/27/2021 1346   Sepsis Labs Recent Labs  Lab 09/27/21 1245 09/28/21 0346 09/29/21 0502  WBC 10.2 7.9 8.5   Microbiology Recent Results (from the past 240 hour(s))  Resp Panel by RT-PCR (Flu A&B, Covid) Anterior Nasal Swab     Status: None   Collection Time: 09/27/21 12:49 PM   Specimen: Anterior Nasal Swab  Result Value Ref Range Status   SARS Coronavirus 2 by RT PCR NEGATIVE NEGATIVE Final    Comment: (NOTE) SARS-CoV-2 target nucleic acids are NOT DETECTED.  The SARS-CoV-2 RNA is generally detectable in upper respiratory specimens during the acute phase of infection. The lowest concentration of SARS-CoV-2 viral copies this assay can detect is 138 copies/mL. A negative result does not preclude SARS-Cov-2 infection and should not be used as the sole basis for treatment or other patient management decisions. A negative result may occur with  improper specimen collection/handling, submission of specimen other than nasopharyngeal swab, presence of viral mutation(s) within the areas targeted by this assay, and inadequate number of viral copies(<138 copies/mL). A negative result must be combined with clinical observations, patient history, and epidemiological information. The expected result is Negative.  Fact Sheet for Patients:  EntrepreneurPulse.com.au  Fact Sheet for Healthcare Providers:  IncredibleEmployment.be  This test is no t yet approved or cleared by the [place] FDA and  has been authorized for detection and/or diagnosis of SARS-CoV-2 by FDA under an Emergency Use Authorization (EUA). This EUA will remain  in effect (meaning this test can be used) for the duration of the COVID-19 declaration under Section 564(b)(1) of the Act, 21 U.S.C.section 360bbb-3(b)(1), unless the authorization is terminated  or revoked sooner.        Influenza A by PCR NEGATIVE NEGATIVE Final   Influenza  B by PCR NEGATIVE NEGATIVE Final    Comment: (NOTE) The Xpert Xpress SARS-CoV-2/FLU/RSV plus assay is intended as an aid in the diagnosis of influenza from Nasopharyngeal swab specimens and should not be used as a sole basis for treatment. Nasal washings and aspirates are unacceptable for Xpert Xpress SARS-CoV-2/FLU/RSV testing.  Fact Sheet for Patients: BloggerCourse.com  Fact Sheet for Healthcare Providers: SeriousBroker.it  This test is not yet approved or cleared by the [place] FDA and has been authorized for detection and/or diagnosis of SARS-CoV-2 by FDA under an Emergency Use Authorization (EUA). This EUA will remain in effect (meaning this test can be used) for the duration of the COVID-19 declaration under Section 564(b)(1) of the Act, 21 U.S.C. section 360bbb-3(b)(1), unless the authorization is terminated or revoked.  Performed at [facility], [address], [place], [place] [address]   Culture, blood (routine x 2)     Status: None (Preliminary result)   Collection Time: 09/27/21  3:03 PM   Specimen: BLOOD  Result Value Ref Range Status   Specimen Description BLOOD  Final   Special Requests BLOOD  Final   Culture   Final    NO GROWTH 2 DAYS Performed at [facility], [address] [place], [place] [address]    Report Status PENDING  Incomplete  Culture, blood (routine x 2)     Status: None (Preliminary result)   Collection Time: 09/27/21  3:03 PM   Specimen: BLOOD  Result Value Ref Range Status   Specimen Description BLOOD  Final   Special Requests BLOOD  Final   Culture   Final    NO GROWTH 2 DAYS Performed at [facility], [address], [place], [place] [address]    Report Status PENDING  Incomplete  MRSA Next Gen by PCR, Nasal     Status: Abnormal   Collection Time: 09/27/21  6:56 PM   Specimen: Nasal Mucosa; Nasal Swab  Result Value Ref  Range Status   MRSA by PCR Next Gen DETECTED (A) NOT DETECTED Final    Comment: RESULT CALLED TO, READ BACK BY AND VERIFIED WITH: WATSON,C @ 1836 ON 09/28/21 BY JUW (NOTE) The GeneXpert MRSA Assay (FDA approved for NASAL specimens only), is one component of a comprehensive MRSA colonization surveillance program. It is not intended to diagnose MRSA infection nor to guide or monitor treatment for MRSA infections. Test performance is not FDA approved in patients less than 20 years old. Performed at [facility], [address], [place], [place] [address]      Time coordinating discharge: 35 minutes  SIGNED:   Erick Blinks, DO [facility] 09/29/2021, 12:44 PM  If 7PM-7AM, please contact night-coverage [email or web address]

## 2021-09-29 NOTE — Progress Notes (Signed)
Called report to [place] at [facility]. Pt is awaiting transport in his room resting comfortably.

## 2021-09-29 NOTE — TOC Transition Note (Signed)
Transition of Care [facility]) - CM/SW Discharge Note   Patient Details  Name: Brent [name] MRN: [id number] Date of Birth: [birth year]  [facility] of [facility]) CM/SW Contact:  Villa Herb, LCSWA Phone Number: 09/29/2021, 2:03 PM   Clinical Narrative:    CSW updated that pt is a long term resident at [facility]. CSW spoke to [place] in admissions who states pt can return today. Pt will go to room B15 bed 2. CSW awaiting update from RN before calling for transport. TOC signing off.   Final next level of care: Long Term Nursing Home Barriers to Discharge: No Barriers Identified   Patient Goals and CMS Choice Patient states their goals for this hospitalization and ongoing recovery are:: return to [facility] CMS Medicare.gov Compare Post Acute Care list provided to:: Patient Choice offered to / list presented to : Patient  Discharge Placement                       Discharge Plan and Services                                     Social Determinants of Health (SDOH) Interventions     Readmission Risk Interventions     No data to display

## 2021-09-29 NOTE — Evaluation (Signed)
Clinical/Bedside Swallow Evaluation Patient Details  Name: Brent [name] MRN: [id number] Date of Birth: [birth year]  Today's Date: 09/29/2021 Time: SLP Start Time (ACUTE ONLY): 1210 SLP Stop Time (ACUTE ONLY): 1232 SLP Time Calculation (min) (ACUTE ONLY): 22 min  Past Medical History:  Past Medical History:  Diagnosis Date   acute posthemorrhagic anemia    Anxiety    Aplastic anemia, unspecified (HCC)    Bipolar disorder (HCC)    Chest pain, unspecified    Chronic airway obstruction, not elsewhere classified    Cocaine substance abuse (HCC)    Dysphagia    Gastrostomy status (HCC)    Hyperlipidemia    Mood disorder (HCC)    Neuropathy    Other and unspecified hyperlipidemia    Pneumonitis due to inhalation of food and vomit (HCC)    Polyneuropathy    Shortness of breath    Stroke (HCC) 06/17/2020   Tobacco use disorder    Type II or unspecified type diabetes mellitus without mention of complication, not stated as uncontrolled    Unspecified epilepsy without mention of intractable epilepsy    Unspecified essential hypertension    Unspecified protein-calorie malnutrition (HCC)    Past Surgical History:  Past Surgical History:  Procedure Laterality Date   BIOPSY  10/02/2020   Procedure: BIOPSY;  Surgeon: Jenel Lucks, MD;  Location: [facility] ENDOSCOPY;  Service: Gastroenterology;;   CARDIAC CATHETERIZATION  10/02/2010    Nonobstructive mild coronary plaque   ESOPHAGOGASTRODUODENOSCOPY N/A 10/02/2020   Procedure: ESOPHAGOGASTRODUODENOSCOPY (EGD);  Surgeon: Jenel Lucks, MD;  Location: [facility] ENDOSCOPY;  Service: Gastroenterology;  Laterality: N/A;   IR CM INJ ANY COLONIC TUBE W/FLUORO  12/04/2020   IR GASTROSTOMY TUBE MOD SED  09/24/2020   IR GASTROSTOMY TUBE REMOVAL  04/21/2021   KNEE SURGERY Bilateral 2018   Repair   PERFORATED VISCUS SURGERY     MULTIPLE FRACTURES   HPI:  Brent [name] is a 56 y.o. male with medical history significant for COPD with chronic  hypoxemia, recurrent aspiration pneumonia in the setting of dysphagia with PEG tube placement, hypertension, CAD, type 2 diabetes, prior CVA, and insomnia who presented to the emergency department via his SNF with some slurred speech and lethargy that has been apparently worsening over the course of the last 1 week.  He has been having thick, greenish sputum and periods of apnea when laying flat.  He was noted to be more hypoxemic and was requiring 15 L nonrebreather by EMS.  No fevers or chills noted and family members at bedside state that he is usually conversational and is able to tolerate pured diet. Last MBSSS on record was 10/18/20 [facility] with recommendation for D3 and pudding thick liquids and use of PEG for supplemental nutririon. Pt has a long standing h/o dysphagia with recommendations for thickened liquids. His  PEG was removed 3/23? and replaced 06/2021. BSE requested.    Assessment / Plan / Recommendation  Clinical Impression  Clinical swallow evaluation completed at bedside. Pt is known to SLP service from previous admissions and swallow studies. Last instrumental assessment on record was 10/18/2020 with recommendation for D3 and pudding thick liquids (). Unsure if Pt has had a repeat assessment via FEES at SNF. He was admitted from [facility]. Pt was on a puree diet with honey thick liquids there. Pt assessed with thin water via tsp and cup sip (elicited immediate cough), honey thick, pudding thick, puree, mech soft, and regular textures. Pt with min lingual reside with  graham crackers. He reportedly was impulsive with intake when on regular textures previously and was placed on puree. Recommend D2 and honey thick liquids with SLP to see Pt at SNF to ensure safety and efficiency with this diet. Pt tells SLP that he now prefers "pudding thick liquids" as he has "gotten used to it". Pt likely to d/c to SNF later today. SLP Visit Diagnosis: Dysphagia, oropharyngeal phase (R13.12)     Aspiration Risk  Moderate aspiration risk    Diet Recommendation Dysphagia 2 (Fine chop);Honey-thick liquid   Liquid Administration via: Cup Medication Administration: Crushed with puree Supervision: Staff to assist with self feeding;Full supervision/cueing for compensatory strategies Compensations: Slow rate;Small sips/bites Postural Changes: Seated upright at 90 degrees;Remain upright for at least 30 minutes after po intake    Other  Recommendations Oral Care Recommendations: Oral care BID;Staff/trained caregiver to provide oral care Other Recommendations: Order thickener from pharmacy;Prohibited food (jello, ice cream, thin soups);Clarify dietary restrictions    Recommendations for follow up therapy are one component of a multi-disciplinary discharge planning process, led by the attending physician.  Recommendations may be updated based on patient status, additional functional criteria and insurance authorization.  Follow up Recommendations Skilled nursing-short term rehab (<3 hours/day)      Assistance Recommended at Discharge Frequent or constant Supervision/Assistance  Functional Status Assessment Patient has had a recent decline in their functional status and demonstrates the ability to make significant improvements in function in a reasonable and predictable amount of time.  Frequency and Duration            Prognosis Prognosis for Safe Diet Advancement: Fair Barriers to Reach Goals: Severity of deficits;Time post onset      Swallow Study   General Date of Onset: 09/27/21 HPI: Brent [name] is a 56 y.o. male with medical history significant for COPD with chronic hypoxemia, recurrent aspiration pneumonia in the setting of dysphagia with PEG tube placement, hypertension, CAD, type 2 diabetes, prior CVA, and insomnia who presented to the emergency department via his SNF with some slurred speech and lethargy that has been apparently worsening over the course of the last 1  week.  He has been having thick, greenish sputum and periods of apnea when laying flat.  He was noted to be more hypoxemic and was requiring 15 L nonrebreather by EMS.  No fevers or chills noted and family members at bedside state that he is usually conversational and is able to tolerate pured diet. Last MBSSS on record was 10/18/20 [facility] with recommendation for D3 and pudding thick liquids and use of PEG for supplemental nutririon. Pt has a long standing h/o dysphagia with recommendations for thickened liquids. His  PEG was removed 3/23? and replaced 06/2021. BSE requested. Type of Study: Bedside Swallow Evaluation Previous Swallow Assessment: 10/18/20 MBSS with [facility] at [facility] D3 and pudding thick liquids Diet Prior to this Study: Dysphagia 1 (puree);Pudding-thick liquids Temperature Spikes Noted: No Respiratory Status: Nasal cannula History of Recent Intubation: No Behavior/Cognition: Alert;Cooperative;Pleasant mood Oral Cavity Assessment: Within Functional Limits Oral Care Completed by SLP: Yes Oral Cavity - Dentition: Missing dentition;Adequate natural dentition Vision: Functional for self-feeding Self-Feeding Abilities: Able to feed self;Needs set up Patient Positioning: Upright in bed Baseline Vocal Quality: Normal Volitional Cough: Strong Volitional Swallow: Able to elicit    Oral/Motor/Sensory Function Overall Oral Motor/Sensory Function: Within functional limits   Ice Chips Ice chips: Not tested   Thin Liquid Thin Liquid: Impaired Presentation: Cup;Spoon Pharyngeal  Phase Impairments: Cough - Immediate  Nectar Thick Nectar Thick Liquid: Not tested   Honey Thick Honey Thick Liquid: Within functional limits Presentation: Cup;Self fed Other Comments: "That's too thin."   Puree Puree: Within functional limits Presentation: Self Fed;Spoon   Solid     Solid: Within functional limits Presentation: Self Fed     Thank you,  Havery Moros,  CCC-SLP [phone]  Shakeila Pfarr 09/29/2021,12:37 PM

## 2021-09-29 NOTE — Progress Notes (Addendum)
Ng Discharge Note  Admit Date:  09/27/2021 Discharge date: 09/29/2021   Read Drivers to be D/C'd Skilled nursing facility per MD order.  AVS completed. Patient/caregiver able to verbalize understanding.  Discharge Medication: Allergies as of 09/29/2021   No Known Allergies      Medication List     TAKE these medications    albuterol 108 (90 Base) MCG/ACT inhaler Commonly known as: VENTOLIN HFA Inhale 2 puffs into the lungs every 6 (six) hours as needed for wheezing or shortness of breath.   ALPRAZolam 0.5 MG tablet Commonly known as: XANAX Take 2 tablets (1 mg total) by mouth 2 (two) times daily as needed for anxiety.   amoxicillin-clavulanate 250-62.5 MG/5ML suspension Commonly known as: AUGMENTIN Place 10 mLs (500 mg total) into feeding tube 2 (two) times daily for 5 days.   ascorbic acid 500 MG tablet Commonly known as: VITAMIN C Take 500 mg by mouth 2 (two) times daily.   aspirin EC 81 MG tablet Take 1 tablet by mouth daily. Feeding tube   baclofen 10 MG tablet Commonly known as: LIORESAL Take 10 mg by mouth 4 (four) times daily.   carBAMazepine 100 MG/5ML suspension Commonly known as: TEGRETOL Place 10 mLs (200 mg total) into feeding tube every 6 (six) hours.   clopidogrel 75 MG tablet Commonly known as: PLAVIX Place 1 tablet into feeding tube daily.   Colchicine 0.6 MG Caps 1 capsule by Feeding Tube route in the morning and at bedtime.   donepezil 10 MG tablet Commonly known as: ARICEPT Take 10 mg by mouth at bedtime.   escitalopram 20 MG tablet Commonly known as: LEXAPRO Place 10 mg into feeding tube daily.   fenofibrate 160 MG tablet Take 1 tablet by mouth daily.   fluticasone 50 MCG/ACT nasal spray Commonly known as: FLONASE Place 1 spray into both nostrils daily.   free water Soln Place 230 mLs into feeding tube 4 (four) times daily.   HYDROcodone-acetaminophen 5-325 MG tablet Commonly known as: NORCO/VICODIN Take 1 tablet by mouth  every 6 (six) hours as needed for moderate pain. Feeding tube   insulin glargine 100 UNIT/ML injection Commonly known as: LANTUS Inject 6 Units into the skin 2 (two) times daily. For DM   Keppra 100 MG/ML solution Generic drug: levETIRAcetam Place 5 mLs into feeding tube 2 (two) times daily.   nitroGLYCERIN 0.4 MG SL tablet Commonly known as: NITROSTAT Place 1 tablet (0.4 mg total) under the tongue every 5 (five) minutes as needed for chest pain.   omeprazole 20 MG capsule Commonly known as: PRILOSEC 40 mg 2 (two) times daily before a meal.   polyethylene glycol 17 g packet Commonly known as: MIRALAX / GLYCOLAX Place 17 g into feeding tube daily.   propranolol 10 MG tablet Commonly known as: INDERAL Place 1 tablet (10 mg total) into feeding tube 2 (two) times daily.   QUEtiapine 100 MG tablet Commonly known as: SEROQUEL Place 1 tablet (100 mg total) into feeding tube at bedtime.   rosuvastatin 20 MG tablet Commonly known as: CRESTOR Place 1 tablet (20 mg total) into feeding tube daily.        Discharge Assessment: Vitals:   09/29/21 0424 09/29/21 0706  BP: 122/75   Pulse: 62   Resp: 17   Temp: 98.8 F (37.1 C)   SpO2: 94% 95%   Skin clean, dry and intact without evidence of skin break down, no evidence of skin tears noted. IV catheter discontinued intact. Site without  signs and symptoms of complications - no redness or edema noted at insertion site, patient denies c/o pain - only slight tenderness at site.  Dressing with slight pressure applied.  D/c Instructions-Education: Discharge instructions given to patient/family with verbalized understanding. D/c education completed with patient including follow up instructions, medication list, d/c activities limitations if indicated, with other d/c instructions as indicated by MD - patient able to verbalize understanding, all questions fully answered. Patient instructed to return to ED, call 911, or call MD for any  changes in condition.  Patient escorted via stretcher, and D/C to SNF by EMS.  Cristal Ford, LPN 05/25/8117 14:78 PM

## 2021-09-29 NOTE — Progress Notes (Addendum)
Patient woke up slightly confused. He took his telemetry, oxygen, and gown off. Oxygen put back on. O2 checked. 99% on 1.5L nasal cannula. Telemetry hooked back up and gown on. Patient fell back asleep. Resting comfortably in bed now. Will continue to monitor.

## 2021-10-02 LAB — CULTURE, BLOOD (ROUTINE X 2)
Culture: NO GROWTH
Culture: NO GROWTH

## 2021-10-21 ENCOUNTER — Emergency Department (HOSPITAL_COMMUNITY): Payer: Medicaid Other

## 2021-10-21 ENCOUNTER — Other Ambulatory Visit: Payer: Self-pay

## 2021-10-21 ENCOUNTER — Emergency Department (HOSPITAL_COMMUNITY)
Admission: EM | Admit: 2021-10-21 | Discharge: 2021-10-21 | Disposition: A | Discharge status: Skilled Nursing Facility | Payer: Medicaid Other | Attending: Emergency Medicine | Admitting: Emergency Medicine

## 2021-10-21 ENCOUNTER — Encounter (HOSPITAL_COMMUNITY): Payer: Self-pay | Admitting: *Deleted

## 2021-10-21 DIAGNOSIS — D649 Anemia, unspecified: Secondary | ICD-10-CM | POA: Diagnosis not present

## 2021-10-21 DIAGNOSIS — I10 Essential (primary) hypertension: Secondary | ICD-10-CM | POA: Diagnosis not present

## 2021-10-21 DIAGNOSIS — E1165 Type 2 diabetes mellitus with hyperglycemia: Secondary | ICD-10-CM | POA: Insufficient documentation

## 2021-10-21 DIAGNOSIS — Z20822 Contact with and (suspected) exposure to covid-19: Secondary | ICD-10-CM | POA: Insufficient documentation

## 2021-10-21 DIAGNOSIS — Z7902 Long term (current) use of antithrombotics/antiplatelets: Secondary | ICD-10-CM | POA: Diagnosis not present

## 2021-10-21 DIAGNOSIS — E860 Dehydration: Secondary | ICD-10-CM | POA: Insufficient documentation

## 2021-10-21 DIAGNOSIS — R52 Pain, unspecified: Secondary | ICD-10-CM | POA: Insufficient documentation

## 2021-10-21 DIAGNOSIS — Z794 Long term (current) use of insulin: Secondary | ICD-10-CM | POA: Insufficient documentation

## 2021-10-21 DIAGNOSIS — Z7982 Long term (current) use of aspirin: Secondary | ICD-10-CM | POA: Diagnosis not present

## 2021-10-21 LAB — BLOOD GAS, VENOUS
Acid-Base Excess: 9.2 mmol/L — ABNORMAL HIGH (ref 0.0–2.0)
Bicarbonate: 37.3 mmol/L — ABNORMAL HIGH (ref 20.0–28.0)
Drawn by: 23766
FIO2: 21 %
O2 Saturation: 44.6 %
Patient temperature: 36.6
pCO2, Ven: 62 mmHg — ABNORMAL HIGH (ref 44–60)
pH, Ven: 7.39 (ref 7.25–7.43)
pO2, Ven: <31 mmHg — CL (ref 32–45)

## 2021-10-21 LAB — CBC WITH DIFFERENTIAL/PLATELET
Abs Immature Granulocytes: 0.02 10*3/uL (ref 0.00–0.07)
Basophils Absolute: 0 10*3/uL (ref 0.0–0.1)
Basophils Relative: 1 %
Eosinophils Absolute: 0.2 10*3/uL (ref 0.0–0.5)
Eosinophils Relative: 3 %
HCT: 35.9 % — ABNORMAL LOW (ref 39.0–52.0)
Hemoglobin: 11.2 g/dL — ABNORMAL LOW (ref 13.0–17.0)
Immature Granulocytes: 0 %
Lymphocytes Relative: 15 %
Lymphs Abs: 1.1 10*3/uL (ref 0.7–4.0)
MCH: 27.5 pg (ref 26.0–34.0)
MCHC: 31.2 g/dL (ref 30.0–36.0)
MCV: 88.2 fL (ref 80.0–100.0)
Monocytes Absolute: 0.5 10*3/uL (ref 0.1–1.0)
Monocytes Relative: 7 %
Neutro Abs: 5.4 10*3/uL (ref 1.7–7.7)
Neutrophils Relative %: 74 %
Platelets: 329 10*3/uL (ref 150–400)
RBC: 4.07 MIL/uL — ABNORMAL LOW (ref 4.22–5.81)
RDW: 13.3 % (ref 11.5–15.5)
WBC: 7.2 10*3/uL (ref 4.0–10.5)
nRBC: 0 % (ref 0.0–0.2)

## 2021-10-21 LAB — SARS CORONAVIRUS 2 BY RT PCR: SARS Coronavirus 2 by RT PCR: NEGATIVE

## 2021-10-21 LAB — COMPREHENSIVE METABOLIC PANEL
ALT: 10 U/L (ref 0–44)
AST: 11 U/L — ABNORMAL LOW (ref 15–41)
Albumin: 3.4 g/dL — ABNORMAL LOW (ref 3.5–5.0)
Alkaline Phosphatase: 75 U/L (ref 38–126)
Anion gap: 5 (ref 5–15)
BUN: 13 mg/dL (ref 6–20)
CO2: 32 mmol/L (ref 22–32)
Calcium: 9.8 mg/dL (ref 8.9–10.3)
Chloride: 103 mmol/L (ref 98–111)
Creatinine, Ser: 1.03 mg/dL (ref 0.61–1.24)
GFR, Estimated: >60 mL/min (ref >60)
Glucose, Bld: 182 mg/dL — ABNORMAL HIGH (ref 70–99)
Potassium: 4.2 mmol/L (ref 3.5–5.1)
Sodium: 140 mmol/L (ref 135–145)
Total Bilirubin: 0.6 mg/dL (ref 0.3–1.2)
Total Protein: 7.7 g/dL (ref 6.5–8.1)

## 2021-10-21 LAB — URINALYSIS, ROUTINE W REFLEX MICROSCOPIC
Bilirubin Urine: NEGATIVE
Glucose, UA: NEGATIVE mg/dL
Hgb urine dipstick: NEGATIVE
Ketones, ur: NEGATIVE mg/dL
Leukocytes,Ua: NEGATIVE
Nitrite: NEGATIVE
Protein, ur: NEGATIVE mg/dL
Specific Gravity, Urine: 1.011 (ref 1.005–1.030)
pH: 7 (ref 5.0–8.0)

## 2021-10-21 LAB — CBG MONITORING, ED: Glucose-Capillary: 192 mg/dL — ABNORMAL HIGH (ref 70–99)

## 2021-10-21 LAB — AMMONIA: Ammonia: 16 umol/L (ref 9–35)

## 2021-10-21 LAB — LACTIC ACID, PLASMA
Lactic Acid, Venous: 0.9 mmol/L (ref 0.5–1.9)
Lactic Acid, Venous: 0.6 mmol/L (ref 0.5–1.9)

## 2021-10-21 MED ORDER — SODIUM CHLORIDE 0.9 % IV BOLUS
1000.0000 mL | Freq: Once | INTRAVENOUS | Status: AC
Start: 2021-10-21 — End: 2021-10-21
  Administered 2021-10-21: 1000 mL via INTRAVENOUS

## 2021-10-21 NOTE — ED Notes (Signed)
Patient transported to CT 

## 2021-10-21 NOTE — ED Triage Notes (Signed)
Pt brought by [facility] for c/o not feeling well;    Cbg 249  Pt has g-tube but continues to eat pureed diet  Pt c/o right knee pain  Pt states he doesn't have an appetite

## 2021-10-21 NOTE — ED Provider Notes (Signed)
[facility] EMERGENCY DEPARTMENT Provider Note   CSN: [id number] Arrival date & time: 10/21/21  1118     History  Chief Complaint  Patient presents with   Generalized Body Aches    Brent [name] is a 56 y.o. male.  Pt is a 56 yo male with a pmhx significant for dm2, hld, htn, epilepsy, cva, aspiration pneumonia, and bipolar d/o.  Pt was admitted from 8/12-8/14 for metabolic encephalopathy and aspiration pna.  Pt has not been feeling well.  Pt is a poor historian and falls asleep during exam.    Pt is now more alert after fluids.  He said the SNF has not been using his g tube.  He has been eating some pureed foods, but not a lot.         Home Medications Prior to Admission medications   Medication Sig Start Date End Date Taking? Authorizing Provider  albuterol (VENTOLIN HFA) 108 (90 Base) MCG/ACT inhaler Inhale 2 puffs into the lungs every 6 (six) hours as needed for wheezing or shortness of breath. 10/17/20  Yes Angiulli, Mcarthur Rossetti, PA-C  ALPRAZolam Prudy Feeler) 0.5 MG tablet Take 2 tablets (1 mg total) by mouth 2 (two) times daily as needed for anxiety. 09/29/21  Yes Shah, Pratik D, DO  ascorbic acid (VITAMIN C) 500 MG tablet Take 500 mg by mouth 2 (two) times daily.   Yes [provider]  aspirin EC 81 MG tablet Take 1 tablet by mouth daily. Feeding tube 07/05/21  Yes [provider]  baclofen (LIORESAL) 10 MG tablet Take 10 mg by mouth 4 (four) times daily.   Yes [provider]  clopidogrel (PLAVIX) 75 MG tablet Place 1 tablet into feeding tube daily. 07/05/21  Yes [provider]  Colchicine 0.6 MG CAPS 1 capsule by Feeding Tube route in the morning and at bedtime. 10/26/20  Yes [provider]  donepezil (ARICEPT) 10 MG tablet Take 10 mg by mouth at bedtime.   Yes [provider]  escitalopram (LEXAPRO) 20 MG tablet Place 20 mg into feeding tube daily.   Yes [provider]  fenofibrate 160 MG tablet Take 1 tablet by  mouth daily. 12/11/16  Yes [provider]  fluticasone (FLONASE) 50 MCG/ACT nasal spray Place 1 spray into both nostrils daily.   Yes [provider]  HYDROcodone-acetaminophen (NORCO/VICODIN) 5-325 MG tablet Take 1 tablet by mouth every 6 (six) hours as needed for moderate pain. Feeding tube 09/29/21  Yes Shah, Pratik D, DO  insulin glargine (LANTUS) 100 UNIT/ML injection Inject 6 Units into the skin 2 (two) times daily. For DM   Yes [provider]  levETIRAcetam (KEPPRA) 100 MG/ML solution Place 5 mLs into feeding tube 2 (two) times daily.   Yes [provider]  omeprazole (PRILOSEC) 20 MG capsule 20 mg 2 (two) times daily before a meal.   Yes [provider]  polyethylene glycol (MIRALAX / GLYCOLAX) 17 g packet Place 17 g into feeding tube daily. 09/27/20  Yes Esaw Grandchild A, DO  propranolol (INDERAL) 10 MG tablet Place 1 tablet (10 mg total) into feeding tube 2 (two) times daily. 10/17/20  Yes Angiulli, Mcarthur Rossetti, PA-C  QUEtiapine (SEROQUEL) 100 MG tablet Place 1 tablet (100 mg total) into feeding tube at bedtime. 10/17/20  Yes Angiulli, Mcarthur Rossetti, PA-C  rosuvastatin (CRESTOR) 20 MG tablet Place 1 tablet (20 mg total) into feeding tube daily. 10/18/20  Yes Angiulli, Mcarthur Rossetti, PA-C  traZODone (DESYREL) 50 MG tablet  Take 50 mg by mouth at bedtime.   Yes [provider]  nitroGLYCERIN (NITROSTAT) 0.4 MG SL tablet Place 1 tablet (0.4 mg total) under the tongue every 5 (five) minutes as needed for chest pain. 09/26/20   Pennie Banter, DO  Water For Irrigation, Sterile (FREE WATER) SOLN Place 230 mLs into feeding tube 4 (four) times daily. 10/17/20   Angiulli, Mcarthur Rossetti, PA-C      Allergies    Patient has no known allergies.    Review of Systems   Review of Systems  Neurological:  Positive for weakness.  All other systems reviewed and are negative.   Physical Exam Updated Vital Signs BP 115/78   Pulse 62   Temp 98.1 F (36.7 C) (Rectal)    Resp 16   Ht 6' (1.829 m)   Wt 88 kg   SpO2 95%   BMI 26.31 kg/m  Physical Exam Vitals and nursing note reviewed.  HENT:     Head: Normocephalic and atraumatic.     Right Ear: External ear normal.     Left Ear: External ear normal.     Nose: Nose normal.     Mouth/Throat:     Mouth: Mucous membranes are dry.  Eyes:     Extraocular Movements: Extraocular movements intact.     Conjunctiva/sclera: Conjunctivae normal.     Pupils: Pupils are equal, round, and reactive to light.  Cardiovascular:     Rate and Rhythm: Normal rate and regular rhythm.     Pulses: Normal pulses.     Heart sounds: Normal heart sounds.  Pulmonary:     Effort: Pulmonary effort is normal.     Breath sounds: Normal breath sounds.  Abdominal:     General: Abdomen is flat. Bowel sounds are normal.     Palpations: Abdomen is soft.     Comments: G-tube noted  Musculoskeletal:        General: Normal range of motion.     Cervical back: Normal range of motion and neck supple.  Skin:    General: Skin is warm.     Capillary Refill: Capillary refill takes less than 2 seconds.  Neurological:     Mental Status: He is easily aroused.     Comments: Left side weakness (chronic) Pt is somnolent on exam, but will awaken briefly when stimulated.     ED Results / Procedures / Treatments   Labs (all labs ordered are listed, but only abnormal results are displayed) Labs Reviewed  CBC WITH DIFFERENTIAL/PLATELET - Abnormal; Notable for the following components:      Result Value   RBC 4.07 (*)    Hemoglobin 11.2 (*)    HCT 35.9 (*)    All other components within normal limits  COMPREHENSIVE METABOLIC PANEL - Abnormal; Notable for the following components:   Glucose, Bld 182 (*)    Albumin 3.4 (*)    AST 11 (*)    All other components within normal limits  BLOOD GAS, VENOUS - Abnormal; Notable for the following components:   pCO2, Ven 62 (*)    pO2, Ven <31 (*)    Bicarbonate 37.3 (*)    Acid-Base Excess 9.2  (*)    All other components within normal limits  CBG MONITORING, ED - Abnormal; Notable for the following components:   Glucose-Capillary 192 (*)    All other components within normal limits  SARS CORONAVIRUS 2 BY RT PCR  URINALYSIS, ROUTINE W REFLEX MICROSCOPIC  AMMONIA  LACTIC  ACID, PLASMA  LACTIC ACID, PLASMA    EKG EKG Interpretation  Date/Time:  Tuesday October 21 2021 12:01:37 EDT Ventricular Rate:  63 PR Interval:  258 QRS Duration: 92 QT Interval:  417 QTC Calculation: 427 R Axis:   17 Text Interpretation: Sinus rhythm Prolonged PR interval No significant change since last tracing Confirmed by Jacalyn Lefevre [phone]) on 10/21/2021 12:40:54 PM  Radiology CT Head Wo Contrast  Result Date: 10/21/2021 CLINICAL DATA:  Mental status change today.  Unknown cause. EXAM: CT HEAD WITHOUT CONTRAST TECHNIQUE: Contiguous axial images were obtained from the base of the skull through the vertex without intravenous contrast. RADIATION DOSE REDUCTION: This exam was performed according to the departmental dose-optimization program which includes automated exposure control, adjustment of the mA and/or kV according to patient size and/or use of iterative reconstruction technique. COMPARISON:  CT head 09/27/2021 and 10/21/2020. FINDINGS: Despite efforts by the technologist and patient, mild motion artifact is present on today's exam and could not be eliminated. This reduces exam sensitivity and specificity. Brain: There is no evidence of acute intracranial hemorrhage, mass lesion, brain edema or extra-axial fluid collection. Moderate atrophy with chronic prominence of the ventricles and subarachnoid spaces. There is confluent low-density in the periventricular white matter bilaterally with an old subcortical infarct in the right frontal lobe. There is no CT evidence of acute cortical infarction. Vascular: Intracranial vascular calcifications. No hyperdense vessel identified. Skull: Negative for  fracture or focal lesion. Sinuses/Orbits: The visualized paranasal sinuses and mastoid air cells are clear. No orbital abnormalities are seen. Other: None. IMPRESSION: 1. No acute intracranial findings. 2. Stable atrophy, chronic small vessel ischemic changes and old right frontal infarct. Electronically Signed   By: Carey Bullocks M.D.   On: 10/21/2021 13:15   DG Chest Portable 1 View  Result Date: 10/21/2021 CLINICAL DATA:  Altered mental status, has G-tube but continues to eat as well, not feeling well EXAM: PORTABLE CHEST 1 VIEW COMPARISON:  Portable exam 1238 hours compared to 09/27/2021 FINDINGS: Normal heart size, mediastinal contours, and pulmonary vascularity. Atherosclerotic calcification aorta. Persistent volume loss question atelectasis versus scarring at LEFT base. Remaining lungs clear. No pleural effusion or pneumothorax. No acute osseous findings. IMPRESSION: Chronic volume loss question atelectasis versus scarring at LEFT lung base. No acute abnormalities. Aortic Atherosclerosis (ICD10-I70.0). Electronically Signed   By: Ulyses Southward M.D.   On: 10/21/2021 12:50    Procedures Procedures    Medications Ordered in ED Medications  sodium chloride 0.9 % bolus 1,000 mL (1,000 mLs Intravenous New Bag/Given 10/21/21 1218)    ED Course/ Medical Decision Making/ A&P                           Medical Decision Making Amount and/or Complexity of Data Reviewed Labs: ordered. Radiology: ordered.   This patient presents to the ED for concern of ams, this involves an extensive number of treatment options, and is a complaint that carries with it a high risk of complications and morbidity.  The differential diagnosis includes hypercarbia, infection, covid, electrolyte abn   Co morbidities that complicate the patient evaluation  dm2, hld, htn, epilepsy, cva, aspiration pneumonia, and bipolar d/o   Additional history obtained:  Additional history obtained from epic chart review External  records from outside source obtained and reviewed including EMS report   Lab Tests:  I Ordered, and personally interpreted labs.  The pertinent results include:  vbg with pH of 7.39, pCO2 62; covid  neg, lactic nl, cbc with hgb 11.2 (last hgb on 8/14 was 10); cmp with glucose 182, bun/cr nl   Imaging Studies ordered:  I ordered imaging studies including cxr and ct head  I independently visualized and interpreted imaging which showed  CXR: IMPRESSION:  Chronic volume loss question atelectasis versus scarring at LEFT  lung base.    No acute abnormalities.    Aortic Atherosclerosis (ICD10-I70.0).  CT head: IMPRESSION:  1. No acute intracranial findings.  2. Stable atrophy, chronic small vessel ischemic changes and old  right frontal infarct.   I agree with the radiologist interpretation   Cardiac Monitoring:  The patient was maintained on a cardiac monitor.  I personally viewed and interpreted the cardiac monitored which showed an underlying rhythm of: nsr   Medicines ordered and prescription drug management:  I ordered medication including IVFs  for dehydration  Reevaluation of the patient after these medicines showed that the patient improved I have reviewed the patients home medicines and have made adjustments as needed   Test Considered:  ct   Critical Interventions:  ivfs   Problem List / ED Course:  Dehydration:  pt feels much better after fluids.  He is more alert.  His daughter said he is at his baseline.  Labs unremarkable.  I will have the SNF given more fluid boluses for the next week while his appetite improves.   Reevaluation:  After the interventions noted above, I reevaluated the patient and found that they have :improved   Social Determinants of Health:  Lives in SNF   Dispostion:  After consideration of the diagnostic results and the patients response to treatment, I feel that the patent would benefit from discharge with outpatient f/u.           Final Clinical Impression(s) / ED Diagnoses Final diagnoses:  Dehydration    Rx / DC Orders ED Discharge Orders     None         Jacalyn Lefevre, MD 10/21/21 1539

## 2021-10-21 NOTE — Discharge Instructions (Addendum)
Dysphagia 2 (Fine chop);Honey-thick liquid    Liquid Administration via: Cup Medication Administration: Crushed with puree Supervision: Staff to assist with self feeding;Full supervision/cueing for compensatory strategies Compensations: Slow rate;Small sips/bites Postural Changes: Seated upright at 90 degrees;Remain upright for at least 30 minutes after po intake     Fluid boluses through G tube:  Give 200 cc water every 4-6 hrs via g tube for 1 week.

## 2021-11-26 ENCOUNTER — Encounter: Payer: Medicaid Other | Attending: Physical Medicine & Rehabilitation | Admitting: Physical Medicine & Rehabilitation

## 2021-12-09 ENCOUNTER — Other Ambulatory Visit: Payer: Self-pay

## 2021-12-09 DIAGNOSIS — J690 Pneumonitis due to inhalation of food and vomit: Secondary | ICD-10-CM

## 2021-12-10 ENCOUNTER — Encounter: Payer: Self-pay | Admitting: Internal Medicine

## 2021-12-10 ENCOUNTER — Ambulatory Visit (INDEPENDENT_AMBULATORY_CARE_PROVIDER_SITE_OTHER): Payer: Medicaid Other | Admitting: Internal Medicine

## 2021-12-10 DIAGNOSIS — J9611 Chronic respiratory failure with hypoxia: Secondary | ICD-10-CM | POA: Diagnosis not present

## 2021-12-10 DIAGNOSIS — F17210 Nicotine dependence, cigarettes, uncomplicated: Secondary | ICD-10-CM

## 2021-12-10 DIAGNOSIS — J690 Pneumonitis due to inhalation of food and vomit: Secondary | ICD-10-CM | POA: Diagnosis not present
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
## 2021-12-10 NOTE — Patient Instructions (Signed)
Please see your dentist when possible   If oxygen need goes up or more need for albuterol would re-assess swallowing/diet    Please schedule a follow up visit in 3 months but call sooner if needed with cxr on return if possible

## 2021-12-13 ENCOUNTER — Encounter: Payer: Self-pay | Admitting: Internal Medicine

## 2021-12-13 NOTE — Assessment & Plan Note (Signed)
Counseled re importance of smoking cessation but did not meet time criteria for separate billing   °

## 2021-12-13 NOTE — Assessment & Plan Note (Signed)
See admit 06/19/21/ LLL atx vs pna > ST evaluated the pt and has been found to be silently aspirating. PEG placement was done and patient started on tube feeding - 12/10/2021 on D3 diet/ tol ok but very poor cough mechanics and poor dentition  Very high risk recurrent asp pna/ reviewed with pt /wife with low threshold to change back to PEG feeding and need f/u by dentist as well as there are no pulmonary medicines that treat aspiration/ just the complications of it.

## 2021-12-13 NOTE — Assessment & Plan Note (Addendum)
02 dep at 3lpm hs only  as of d/c from [facility] 07/05/21  Might need amb 02 if more active but for now well compensated at rest on RA    F/u at 3 m, sooner prn with cxr on return    Each maintenance medication was reviewed in detail including emphasizing most importantly the difference between maintenance and prns and under what circumstances the prns are to be triggered using an action plan format where appropriate.  Total time for H and P, chart review, counseling,  and generating customized AVS unique to this office visit / same day charting = 26 min

## 2021-12-16 ENCOUNTER — Other Ambulatory Visit (HOSPITAL_COMMUNITY): Payer: Self-pay | Admitting: Nurse Practitioner

## 2021-12-16 DIAGNOSIS — R633 Feeding difficulties, unspecified: Secondary | ICD-10-CM

## 2021-12-23 ENCOUNTER — Inpatient Hospital Stay (HOSPITAL_COMMUNITY)
Admission: RE | Admit: 2021-12-23 | Discharge: 2021-12-23 | Disposition: A | Discharge status: Home / Self Care | Payer: Medicaid Other | Source: Ambulatory Visit | Attending: Nurse Practitioner | Admitting: Nurse Practitioner

## 2021-12-25 ENCOUNTER — Other Ambulatory Visit (HOSPITAL_COMMUNITY): Payer: Self-pay | Admitting: Nurse Practitioner

## 2021-12-25 ENCOUNTER — Ambulatory Visit (HOSPITAL_COMMUNITY)
Admission: RE | Admit: 2021-12-25 | Discharge: 2021-12-25 | Disposition: A | Discharge status: Home / Self Care | Payer: Medicaid Other | Source: Ambulatory Visit | Attending: Nurse Practitioner | Admitting: Nurse Practitioner

## 2021-12-25 DIAGNOSIS — R633 Feeding difficulties, unspecified: Secondary | ICD-10-CM | POA: Diagnosis not present

## 2021-12-25 DIAGNOSIS — Z431 Encounter for attention to gastrostomy: Secondary | ICD-10-CM | POA: Diagnosis present

## 2021-12-25 HISTORY — PX: IR REPLACE G-TUBE SIMPLE WO FLUORO: IMG2323

## 2021-12-25 MED ORDER — LIDOCAINE VISCOUS HCL 2 % MT SOLN
OROMUCOSAL | Status: AC
  Filled 2021-12-25: qty 15

## 2022-01-14 ENCOUNTER — Encounter: Payer: Self-pay | Admitting: Pulmonary Disease

## 2022-01-14 ENCOUNTER — Ambulatory Visit (INDEPENDENT_AMBULATORY_CARE_PROVIDER_SITE_OTHER): Payer: Medicaid Other | Admitting: Pulmonary Disease

## 2022-01-14 ENCOUNTER — Ambulatory Visit (INDEPENDENT_AMBULATORY_CARE_PROVIDER_SITE_OTHER): Payer: Medicaid Other

## 2022-01-14 VITALS — BP 116/74 | HR 87 | Ht 72.0 in | Wt 200.0 lb

## 2022-01-14 DIAGNOSIS — J690 Pneumonitis due to inhalation of food and vomit: Secondary | ICD-10-CM | POA: Diagnosis not present

## 2022-01-14 DIAGNOSIS — R9389 Abnormal findings on diagnostic imaging of other specified body structures: Secondary | ICD-10-CM | POA: Diagnosis not present

## 2022-01-14 DIAGNOSIS — R1310 Dysphagia, unspecified: Secondary | ICD-10-CM

## 2022-01-14 MED ORDER — IPRATROPIUM-ALBUTEROL 0.5-2.5 (3) MG/3ML IN SOLN: 3.0000 mL | Freq: Two times a day (BID) | RESPIRATORY_TRACT | 6 refills | Status: DC

## 2022-01-14 NOTE — Patient Instructions (Addendum)
Recommend duoneb nebulizer treatments twice daily followed by flutter valve therapy  Recommend chest physical therapy by the respiratory therapy team at the care facility to help with pulmonary hygiene and mucous clearance  Continue to work with speech therapy with your swallowing  Use fluticasone nasal spray, 1 spray per nostril daily  No need for bronchoscopy at this time.  Follow up with Dr. Sherene Sires in [place] [facility] as scheduled
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
## 2022-02-27 ENCOUNTER — Ambulatory Visit (INDEPENDENT_AMBULATORY_CARE_PROVIDER_SITE_OTHER): Payer: Medicaid Other | Admitting: Podiatry

## 2022-02-27 ENCOUNTER — Telehealth: Payer: Self-pay | Admitting: *Deleted

## 2022-02-27 DIAGNOSIS — M79674 Pain in right toe(s): Secondary | ICD-10-CM

## 2022-02-27 DIAGNOSIS — E0842 Diabetes mellitus due to underlying condition with diabetic polyneuropathy: Secondary | ICD-10-CM

## 2022-02-27 DIAGNOSIS — B351 Tinea unguium: Secondary | ICD-10-CM | POA: Diagnosis not present

## 2022-02-27 DIAGNOSIS — L03032 Cellulitis of left toe: Secondary | ICD-10-CM

## 2022-02-27 DIAGNOSIS — M79675 Pain in left toe(s): Secondary | ICD-10-CM

## 2022-02-27 MED ORDER — DOXYCYCLINE HYCLATE 100 MG PO TABS: 100.0000 mg | ORAL_TABLET | Freq: Two times a day (BID) | ORAL | 0 refills | Status: AC

## 2022-02-27 NOTE — Telephone Encounter (Signed)
[place] from nursing center is calling for strength of medication prescribed for patient. Faxed information to her [phone].

## 2022-02-28 NOTE — Progress Notes (Signed)
This patient presents to the office for nail care.  He says he has CVA and has been laying in his bed for over a year.  He has diagnosis of CVA, post CVA symptoms and coagulation defect.  He takes plavix.   He presents to the office with driver from his facility in [place] and is treated in his wheelchair.  He presents to the office for preventative foot care services.   General Appearance  Alert, conversant and in no acute stress.  He shows history of CVA in his talking.  Vascular  Dorsalis pedis and posterior tibial  pulses are weakly palpable  bilaterally.  Capillary return is within normal limits  bilaterally. Temperature is within normal limits  bilaterally.  Neurologic  Senn-Weinstein monofilament wire test diminished bilaterally. Muscle power within normal limits bilaterally.  Nails Thick disfigured discolored nails with subungual debris  from hallux to fifth toes bilaterally. Upon treatment of his left hallux there is localized area of pus noted at the distal aspect of medial border left hallux.  There is granulation tissue noted in medial nail groove left hallux.  No pain or redness noted in left hallux nail.  Orthopedic  No limitations of motion  feet .  No crepitus or effusions noted.  No bony pathology or digital deformities noted.  Skin  normotropic skin with no porokeratosis noted bilaterally.  No signs of infections or ulcers noted.   Severe peeling on plantar aspect feet  B/L.  Paronychia medial border left hallux  Granulation tissue medial border left hallux.  Ontchomycosis  B/L.  IE.  Debride nails with nail nipper and dremel tool.  Clean pus in medial border left hallux.  Upon examination of the medial border left hallux there  is granulation  tissue present along the medial groove.  Discussed conservative vs.surgical  treatment and the patient chooses to treat this problem conservatively with antibiotics.  Prescribe doxycycline 100 mg x 10 days.  Neosporin/DSD left hallux.  RTC  in 10-14 days and make an appointment with Dr. Posey Pronto for possible I & D medial border left hallux  Gardiner Barefoot DPM

## 2022-03-05 ENCOUNTER — Ambulatory Visit: Payer: Medicaid Other | Admitting: Internal Medicine
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
## 2022-03-06 ENCOUNTER — Ambulatory Visit: Payer: Medicaid Other | Admitting: Internal Medicine
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
## 2022-03-10 ENCOUNTER — Ambulatory Visit: Payer: Medicaid Other | Admitting: Podiatry
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
## 2022-04-16 ENCOUNTER — Encounter: Payer: Self-pay | Admitting: Internal Medicine

## 2022-04-16 ENCOUNTER — Ambulatory Visit (INDEPENDENT_AMBULATORY_CARE_PROVIDER_SITE_OTHER): Payer: Medicaid Other | Admitting: Internal Medicine

## 2022-04-16 VITALS — BP 130/84 | HR 68 | Ht 72.0 in

## 2022-04-16 DIAGNOSIS — F17210 Nicotine dependence, cigarettes, uncomplicated: Secondary | ICD-10-CM | POA: Diagnosis not present

## 2022-04-16 DIAGNOSIS — J690 Pneumonitis due to inhalation of food and vomit: Secondary | ICD-10-CM

## 2022-04-16 DIAGNOSIS — J9611 Chronic respiratory failure with hypoxia: Secondary | ICD-10-CM | POA: Diagnosis not present

## 2022-04-16 NOTE — Assessment & Plan Note (Addendum)
Counseled re importance of smoking cessation but did not meet time criteria for separate billing    Advised of dangers of cigarettes/ 02     Each maintenance medication was reviewed in detail including emphasizing most importantly the difference between maintenance and prns and under what circumstances the prns are to be triggered using an action plan format where appropriate.  Total time for H and P, chart review, counseling, reviewing neb/02 device(s) and generating customized AVS unique to this office visit / same day charting = 31 min

## 2022-04-16 NOTE — Assessment & Plan Note (Signed)
02 dep at 3lpm hs only  as of d/c from [facility] 07/05/21  As of 04/16/2022  2lpm hs and prn daytime

## 2022-04-16 NOTE — Patient Instructions (Addendum)
No change in recommendations - there is nothing else to offer in this situation but I understand your frustration that you are not being allowed to eat what you want.   If you wish to eat what you want it may reduce your life expectancy in which palliative care /hospice situation would be the best option over being placed on ventilator (breathing thru tube in your neck) or fed thru a tube again  Pulmonary follow up is as needed

## 2022-04-16 NOTE — Assessment & Plan Note (Signed)
Active smoker See admit 06/19/21/ LLL atx vs pna > ST evaluated the pt and has been found to be silently aspirating. 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  Apparently he has flp flopped on this issue many times per family member but he of sound mind and make his own decisions  Nothing else to offer in pulmonary clinic/f/u is prn

## 2022-06-02 ENCOUNTER — Ambulatory Visit: Payer: Medicaid Other | Admitting: Cardiology

## 2022-06-25 ENCOUNTER — Other Ambulatory Visit (HOSPITAL_COMMUNITY): Payer: Medicaid Other

## 2022-08-14 ENCOUNTER — Ambulatory Visit: Payer: Medicaid Other | Admitting: Podiatry

## 2022-08-18 ENCOUNTER — Ambulatory Visit (INDEPENDENT_AMBULATORY_CARE_PROVIDER_SITE_OTHER): Payer: Medicaid Other | Admitting: Podiatry

## 2022-08-18 DIAGNOSIS — L600 Ingrowing nail: Secondary | ICD-10-CM

## 2022-08-18 NOTE — Progress Notes (Signed)
    Subjective:  Patient ID: Brent [name], male    DOB: May 27, 1965,  MRN: [id number]  Brent [name] presents to clinic today for:  Chief Complaint  Patient presents with   Ingrown Toenail    Pt has an ingrown toe nail that is infected on his left foot great toe    PCP is Simpson-Tarokh, Leann, DO (Inactive).  Patient is a type II diabetic, uncontrolled  No Known Allergies  Review of Systems: Negative except as noted in the HPI.  Objective:  There were no vitals filed for this visit.  Brent [name] is a pleasant 57 y.o. male in NAD. AAO x 3.  Vascular Examination: Capillary refill time is 3-5 seconds to toes bilateral. Palpable pedal pulses b/l LE. Digital hair present b/l. No pedal edema b/l. Skin temperature gradient WNL b/l. No varicosities b/l. No cyanosis or clubbing noted b/l.   Dermatological Examination: There is incurvation of the left hallux medial and lateral nail border.  There is pain on palpation of the affected nail borders.  There is localized edema along the nail margin.  No active drainage is seen.     Latest Ref Rng & Units 09/27/2021   12:45 PM  Hemoglobin A1C  Hemoglobin-A1c 4.8 - 5.6 % 8.7    Assessment/Plan: 1. Ingrown toenail     Discussed patient's condition today.  After obtaining patient consent, the left great toe was anesthetized with a 50:50 mixture of 1% lidocaine plain and 0.5% bupivacaine plain for a total of 3cc's administered.  Upon confirmation of anesthesia, a freer elevator was utilized to free the medial and lateral nail border from the nail bed.  The nail border was then avulsed proximal to the eponychium and removed in toto.  The area was inspected for any remaining spicules.  A chemical matrixectomy was performed with phenol and neutralized with alcohol solution.  Antibiotic ointment and a DSD were applied, followed by a Coban dressing.  Patient tolerated the anesthetic and procedure well and will f/u in 2-3 weeks for recheck.   Patient given post-procedure instructions for Epsom salt soaks, antibiotic ointment and daily use of Bandaids until toe starts to dry / form eschar.   There is some concern regarding possible delayed healing since his last A1c from 1 year ago was 8.7.  Return in about 2 weeks (around 09/01/2022) for PNA recheck.   Clerance Lav, DPM, FACFAS [facility]     [address] [place], [place] [address]                Office [phone]  Fax [phone]

## 2022-08-18 NOTE — Patient Instructions (Signed)

## 2022-09-15 ENCOUNTER — Ambulatory Visit (INDEPENDENT_AMBULATORY_CARE_PROVIDER_SITE_OTHER): Payer: Medicaid Other | Admitting: Podiatry

## 2022-09-15 DIAGNOSIS — L600 Ingrowing nail: Secondary | ICD-10-CM | POA: Diagnosis not present

## 2022-09-15 NOTE — Progress Notes (Signed)
       Subjective:  Patient ID: Brent [name], male    DOB: 25-Apr-1965,  MRN: [id number]  Chief Complaint  Patient presents with   Ingrown Toenail    PNA recheck- 2weeks L great toe. Pt stated that he is getting better. Pt denies pain.      [name] presents to clinic today for f/u of PNA to the left hallux medial and lateral nail borders.  Patient states he is not having much pain to the area.  His family member that is with him today states that none of the original instructions for the toe were carried out the first 7 days after he was seen and the procedure was performed.  She states that she had to speak to someone to even have the orders begun so that the toe would be taking care of for his post procedure care  Paperwork was completed for his facility and these instructions were also written on the doctors notes sheet  PCP is Simpson-Tarokh, Leann, DO.  No Known Allergies  Objective:  Vascular Examination: Capillary refill time is 3-5 seconds to toes bilateral. Palpable pedal pulses b/l LE. Digital hair present b/l. No pedal edema b/l. Skin temperature gradient WNL b/l. No varicosities b/l. No cyanosis or clubbing noted b/l.   Dermatological Examination: Upon inspection of the PNA site, there still is some drainage to the medial border of the left hallux nail.  The lateral border appears that it is healing well with eschar forming.  Minimal to no erythema at this time.  No edema.  No pain on palpation of the area.  Assessment/Plan: S/p left hallux medial and lateral nail border PNA.  Not quite healed 100% yet.  Discussed findings with patient and his family member.  Betadine applied to the medial nail fold and a dry sterile dressing was applied.  He needs to have Betadine applied once daily to the toenail borders with a light gauze dressing.  Can  discontinue these orders in 10 days.  Return if symptoms worsen or fail to improve.   Clerance Lav, DPM,  FACFAS [facility]     [address] [place], [place] [address]                Office [phone]  Fax [phone]

## 2022-09-21 ENCOUNTER — Emergency Department (HOSPITAL_COMMUNITY): Payer: Medicaid Other

## 2022-09-21 ENCOUNTER — Inpatient Hospital Stay (HOSPITAL_COMMUNITY): Payer: Medicaid Other

## 2022-09-21 ENCOUNTER — Other Ambulatory Visit: Payer: Self-pay

## 2022-09-21 ENCOUNTER — Inpatient Hospital Stay (HOSPITAL_COMMUNITY)
Admission: EM | Admit: 2022-09-21 | Discharge: 2022-10-18 | DRG: 177 | Disposition: E | Payer: Medicaid Other | Attending: Internal Medicine | Admitting: Internal Medicine

## 2022-09-21 ENCOUNTER — Encounter (HOSPITAL_COMMUNITY): Payer: Self-pay | Admitting: Family Medicine

## 2022-09-21 DIAGNOSIS — E781 Pure hyperglyceridemia: Secondary | ICD-10-CM | POA: Diagnosis present

## 2022-09-21 DIAGNOSIS — G40219 Localization-related (focal) (partial) symptomatic epilepsy and epileptic syndromes with complex partial seizures, intractable, without status epilepticus: Secondary | ICD-10-CM | POA: Diagnosis present

## 2022-09-21 DIAGNOSIS — Z91119 Patient's noncompliance with dietary regimen due to unspecified reason: Secondary | ICD-10-CM

## 2022-09-21 DIAGNOSIS — Z66 Do not resuscitate: Secondary | ICD-10-CM | POA: Diagnosis present

## 2022-09-21 DIAGNOSIS — T17998A Other foreign object in respiratory tract, part unspecified causing other injury, initial encounter: Secondary | ICD-10-CM | POA: Diagnosis not present

## 2022-09-21 DIAGNOSIS — Z9981 Dependence on supplemental oxygen: Secondary | ICD-10-CM

## 2022-09-21 DIAGNOSIS — E1142 Type 2 diabetes mellitus with diabetic polyneuropathy: Secondary | ICD-10-CM | POA: Diagnosis present

## 2022-09-21 DIAGNOSIS — Z794 Long term (current) use of insulin: Secondary | ICD-10-CM | POA: Diagnosis not present

## 2022-09-21 DIAGNOSIS — J9601 Acute respiratory failure with hypoxia: Secondary | ICD-10-CM | POA: Diagnosis present

## 2022-09-21 DIAGNOSIS — R051 Acute cough: Secondary | ICD-10-CM

## 2022-09-21 DIAGNOSIS — Z79899 Other long term (current) drug therapy: Secondary | ICD-10-CM

## 2022-09-21 DIAGNOSIS — K219 Gastro-esophageal reflux disease without esophagitis: Secondary | ICD-10-CM | POA: Diagnosis present

## 2022-09-21 DIAGNOSIS — Z515 Encounter for palliative care: Secondary | ICD-10-CM

## 2022-09-21 DIAGNOSIS — E1165 Type 2 diabetes mellitus with hyperglycemia: Secondary | ICD-10-CM

## 2022-09-21 DIAGNOSIS — J9621 Acute and chronic respiratory failure with hypoxia: Secondary | ICD-10-CM | POA: Diagnosis not present

## 2022-09-21 DIAGNOSIS — I69391 Dysphagia following cerebral infarction: Secondary | ICD-10-CM | POA: Diagnosis not present

## 2022-09-21 DIAGNOSIS — R1314 Dysphagia, pharyngoesophageal phase: Secondary | ICD-10-CM | POA: Diagnosis not present

## 2022-09-21 DIAGNOSIS — E1169 Type 2 diabetes mellitus with other specified complication: Secondary | ICD-10-CM | POA: Diagnosis present

## 2022-09-21 DIAGNOSIS — Z91199 Patient's noncompliance with other medical treatment and regimen due to unspecified reason: Secondary | ICD-10-CM

## 2022-09-21 DIAGNOSIS — I152 Hypertension secondary to endocrine disorders: Secondary | ICD-10-CM | POA: Diagnosis present

## 2022-09-21 DIAGNOSIS — E0842 Diabetes mellitus due to underlying condition with diabetic polyneuropathy: Secondary | ICD-10-CM | POA: Diagnosis present

## 2022-09-21 DIAGNOSIS — F319 Bipolar disorder, unspecified: Secondary | ICD-10-CM | POA: Diagnosis present

## 2022-09-21 DIAGNOSIS — F17210 Nicotine dependence, cigarettes, uncomplicated: Secondary | ICD-10-CM | POA: Diagnosis present

## 2022-09-21 DIAGNOSIS — J690 Pneumonitis due to inhalation of food and vomit: Principal | ICD-10-CM

## 2022-09-21 DIAGNOSIS — F411 Generalized anxiety disorder: Secondary | ICD-10-CM | POA: Diagnosis present

## 2022-09-21 DIAGNOSIS — J90 Pleural effusion, not elsewhere classified: Secondary | ICD-10-CM | POA: Diagnosis present

## 2022-09-21 DIAGNOSIS — Z825 Family history of asthma and other chronic lower respiratory diseases: Secondary | ICD-10-CM

## 2022-09-21 DIAGNOSIS — J441 Chronic obstructive pulmonary disease with (acute) exacerbation: Secondary | ICD-10-CM | POA: Diagnosis present

## 2022-09-21 DIAGNOSIS — R1310 Dysphagia, unspecified: Secondary | ICD-10-CM | POA: Diagnosis present

## 2022-09-21 DIAGNOSIS — Z8673 Personal history of transient ischemic attack (TIA), and cerebral infarction without residual deficits: Secondary | ICD-10-CM

## 2022-09-21 DIAGNOSIS — Z7189 Other specified counseling: Secondary | ICD-10-CM | POA: Diagnosis not present

## 2022-09-21 DIAGNOSIS — I2510 Atherosclerotic heart disease of native coronary artery without angina pectoris: Secondary | ICD-10-CM | POA: Diagnosis present

## 2022-09-21 DIAGNOSIS — J9611 Chronic respiratory failure with hypoxia: Secondary | ICD-10-CM | POA: Diagnosis present

## 2022-09-21 DIAGNOSIS — E782 Mixed hyperlipidemia: Secondary | ICD-10-CM | POA: Diagnosis present

## 2022-09-21 DIAGNOSIS — R569 Unspecified convulsions: Secondary | ICD-10-CM | POA: Diagnosis not present

## 2022-09-21 DIAGNOSIS — G4733 Obstructive sleep apnea (adult) (pediatric): Secondary | ICD-10-CM | POA: Diagnosis present

## 2022-09-21 DIAGNOSIS — Z7982 Long term (current) use of aspirin: Secondary | ICD-10-CM

## 2022-09-21 DIAGNOSIS — Z7902 Long term (current) use of antithrombotics/antiplatelets: Secondary | ICD-10-CM

## 2022-09-21 DIAGNOSIS — R059 Cough, unspecified: Secondary | ICD-10-CM | POA: Diagnosis present

## 2022-09-21 DIAGNOSIS — Z8249 Family history of ischemic heart disease and other diseases of the circulatory system: Secondary | ICD-10-CM | POA: Diagnosis not present

## 2022-09-21 DIAGNOSIS — E1159 Type 2 diabetes mellitus with other circulatory complications: Secondary | ICD-10-CM | POA: Diagnosis present

## 2022-09-21 LAB — CBC WITH DIFFERENTIAL/PLATELET
Abs Immature Granulocytes: 0.04 10*3/uL (ref 0.00–0.07)
Basophils Absolute: 0.1 10*3/uL (ref 0.0–0.1)
Basophils Relative: 1 %
Eosinophils Absolute: 0.2 10*3/uL (ref 0.0–0.5)
Eosinophils Relative: 2 %
HCT: 34.8 % — ABNORMAL LOW (ref 39.0–52.0)
Hemoglobin: 10.2 g/dL — ABNORMAL LOW (ref 13.0–17.0)
Immature Granulocytes: 1 %
Lymphocytes Relative: 12 %
Lymphs Abs: 1 10*3/uL (ref 0.7–4.0)
MCH: 26.4 pg (ref 26.0–34.0)
MCHC: 29.3 g/dL — ABNORMAL LOW (ref 30.0–36.0)
MCV: 89.9 fL (ref 80.0–100.0)
Monocytes Absolute: 0.6 10*3/uL (ref 0.1–1.0)
Monocytes Relative: 7 %
Neutro Abs: 6.8 10*3/uL (ref 1.7–7.7)
Neutrophils Relative %: 77 %
Platelets: 369 10*3/uL (ref 150–400)
RBC: 3.87 MIL/uL — ABNORMAL LOW (ref 4.22–5.81)
RDW: 15 % (ref 11.5–15.5)
WBC: 8.7 10*3/uL (ref 4.0–10.5)
nRBC: 0 % (ref 0.0–0.2)

## 2022-09-21 LAB — BASIC METABOLIC PANEL
Anion gap: 6 (ref 5–15)
BUN: 13 mg/dL (ref 6–20)
CO2: 32 mmol/L (ref 22–32)
Calcium: 8.7 mg/dL — ABNORMAL LOW (ref 8.9–10.3)
Chloride: 100 mmol/L (ref 98–111)
Creatinine, Ser: 1.07 mg/dL (ref 0.61–1.24)
GFR, Estimated: >60 mL/min (ref >60)
Glucose, Bld: 147 mg/dL — ABNORMAL HIGH (ref 70–99)
Potassium: 4 mmol/L (ref 3.5–5.1)
Sodium: 138 mmol/L (ref 135–145)

## 2022-09-21 LAB — GLUCOSE, CAPILLARY
Glucose-Capillary: 208 mg/dL — ABNORMAL HIGH (ref 70–99)
Glucose-Capillary: 277 mg/dL — ABNORMAL HIGH (ref 70–99)

## 2022-09-21 LAB — BRAIN NATRIURETIC PEPTIDE: B Natriuretic Peptide: 42 pg/mL (ref 0.0–100.0)

## 2022-09-21 LAB — LACTIC ACID, PLASMA: Lactic Acid, Venous: 0.8 mmol/L (ref 0.5–1.9)

## 2022-09-21 LAB — HEMOGLOBIN A1C
Hgb A1c MFr Bld: 7 % — ABNORMAL HIGH (ref 4.8–5.6)
Mean Plasma Glucose: 154.2 mg/dL

## 2022-09-21 MED ORDER — ACETYLCYSTEINE 20 % IN SOLN
10.0000 mL | Freq: Three times a day (TID) | RESPIRATORY_TRACT | Status: DC
  Administered 2022-09-21 – 2022-09-22 (×3): 10 mL via RESPIRATORY_TRACT
  Filled 2022-09-21 (×3): qty 12

## 2022-09-21 MED ORDER — METHYLPREDNISOLONE SODIUM SUCC 125 MG IJ SOLR
80.0000 mg | Freq: Once | INTRAMUSCULAR | Status: AC
  Administered 2022-09-21: 80 mg via INTRAVENOUS
  Filled 2022-09-21: qty 2

## 2022-09-21 MED ORDER — DONEPEZIL HCL 5 MG PO TABS
10.0000 mg | ORAL_TABLET | Freq: Every day | ORAL | Status: DC
  Administered 2022-09-21: 10 mg via ORAL
  Filled 2022-09-21: qty 2

## 2022-09-21 MED ORDER — NITROGLYCERIN 0.4 MG SL SUBL: 0.4000 mg | SUBLINGUAL_TABLET | SUBLINGUAL | Status: DC | PRN

## 2022-09-21 MED ORDER — PROPRANOLOL HCL 20 MG PO TABS
10.0000 mg | ORAL_TABLET | Freq: Two times a day (BID) | ORAL | Status: DC
  Administered 2022-09-21: 10 mg via ORAL
  Filled 2022-09-21: qty 1

## 2022-09-21 MED ORDER — SODIUM CHLORIDE 0.9 % IV SOLN: INTRAVENOUS | Status: AC

## 2022-09-21 MED ORDER — METHYLPREDNISOLONE SODIUM SUCC 40 MG IJ SOLR: 40.0000 mg | Freq: Every day | INTRAMUSCULAR | Status: DC

## 2022-09-21 MED ORDER — ACETAMINOPHEN 650 MG RE SUPP: 650.0000 mg | Freq: Four times a day (QID) | RECTAL | Status: DC | PRN

## 2022-09-21 MED ORDER — CLOPIDOGREL BISULFATE 75 MG PO TABS: 75.0000 mg | ORAL_TABLET | Freq: Every day | ORAL | Status: DC

## 2022-09-21 MED ORDER — ASPIRIN 81 MG PO CHEW: 81.0000 mg | CHEWABLE_TABLET | Freq: Every day | ORAL | Status: DC

## 2022-09-21 MED ORDER — ONDANSETRON HCL 4 MG PO TABS: 4.0000 mg | ORAL_TABLET | Freq: Four times a day (QID) | ORAL | Status: DC | PRN

## 2022-09-21 MED ORDER — INSULIN GLARGINE-YFGN 100 UNIT/ML ~~LOC~~ SOLN
7.0000 [IU] | Freq: Two times a day (BID) | SUBCUTANEOUS | Status: DC
  Administered 2022-09-21: 7 [IU] via SUBCUTANEOUS
  Filled 2022-09-21 (×2): qty 0.07

## 2022-09-21 MED ORDER — ALBUTEROL SULFATE (2.5 MG/3ML) 0.083% IN NEBU
3.0000 mL | INHALATION_SOLUTION | Freq: Four times a day (QID) | RESPIRATORY_TRACT | Status: DC | PRN
  Administered 2022-09-22: 3 mL via RESPIRATORY_TRACT

## 2022-09-21 MED ORDER — BISACODYL 5 MG PO TBEC: 5.0000 mg | DELAYED_RELEASE_TABLET | Freq: Every day | ORAL | Status: DC | PRN

## 2022-09-21 MED ORDER — IPRATROPIUM-ALBUTEROL 0.5-2.5 (3) MG/3ML IN SOLN
3.0000 mL | RESPIRATORY_TRACT | Status: DC
  Administered 2022-09-21 – 2022-09-22 (×6): 3 mL via RESPIRATORY_TRACT
  Filled 2022-09-21 (×6): qty 3

## 2022-09-21 MED ORDER — ONDANSETRON HCL 4 MG/2ML IJ SOLN: 4.0000 mg | Freq: Four times a day (QID) | INTRAMUSCULAR | Status: DC | PRN

## 2022-09-21 MED ORDER — CHLORHEXIDINE GLUCONATE CLOTH 2 % EX PADS
6.0000 | MEDICATED_PAD | Freq: Every day | CUTANEOUS | Status: DC
  Administered 2022-09-22: 6 via TOPICAL

## 2022-09-21 MED ORDER — COLCHICINE 0.6 MG PO TABS
0.6000 mg | ORAL_TABLET | Freq: Two times a day (BID) | ORAL | Status: DC
  Administered 2022-09-21: 0.6 mg via ORAL
  Filled 2022-09-21: qty 1

## 2022-09-21 MED ORDER — INSULIN ASPART 100 UNIT/ML IJ SOLN
0.0000 [IU] | Freq: Three times a day (TID) | INTRAMUSCULAR | Status: DC
  Administered 2022-09-21: 5 [IU] via SUBCUTANEOUS
  Administered 2022-09-22: 3 [IU] via SUBCUTANEOUS

## 2022-09-21 MED ORDER — PANTOPRAZOLE SODIUM 40 MG PO TBEC
40.0000 mg | DELAYED_RELEASE_TABLET | Freq: Every day | ORAL | Status: DC
  Administered 2022-09-21: 40 mg via ORAL
  Filled 2022-09-21: qty 1

## 2022-09-21 MED ORDER — CLONAZEPAM 0.5 MG PO TABS
0.5000 mg | ORAL_TABLET | Freq: Two times a day (BID) | ORAL | Status: DC
  Administered 2022-09-21: 0.5 mg via ORAL
  Filled 2022-09-21: qty 1

## 2022-09-21 MED ORDER — ACETAMINOPHEN 325 MG PO TABS: 650.0000 mg | ORAL_TABLET | Freq: Four times a day (QID) | ORAL | Status: DC | PRN

## 2022-09-21 MED ORDER — VITAMIN C 500 MG PO TABS: 500.0000 mg | ORAL_TABLET | Freq: Every day | ORAL | Status: DC

## 2022-09-21 MED ORDER — FENTANYL CITRATE PF 50 MCG/ML IJ SOSY
12.5000 ug | PREFILLED_SYRINGE | INTRAMUSCULAR | Status: DC | PRN
  Administered 2022-09-21 – 2022-09-22 (×4): 12.5 ug via INTRAVENOUS
  Filled 2022-09-21 (×4): qty 1

## 2022-09-21 MED ORDER — QUETIAPINE FUMARATE 25 MG PO TABS
150.0000 mg | ORAL_TABLET | Freq: Every day | ORAL | Status: DC
  Administered 2022-09-21: 150 mg via ORAL
  Filled 2022-09-21: qty 2

## 2022-09-21 MED ORDER — POLYETHYLENE GLYCOL 3350 17 G PO PACK: 17.0000 g | PACK | Freq: Every day | ORAL | Status: DC

## 2022-09-21 MED ORDER — HEPARIN SODIUM (PORCINE) 5000 UNIT/ML IJ SOLN
5000.0000 [IU] | Freq: Three times a day (TID) | INTRAMUSCULAR | Status: DC
  Administered 2022-09-21 – 2022-09-22 (×3): 5000 [IU] via SUBCUTANEOUS
  Filled 2022-09-21 (×3): qty 1

## 2022-09-21 MED ORDER — ESCITALOPRAM OXALATE 10 MG PO TABS: 20.0000 mg | ORAL_TABLET | Freq: Every day | ORAL | Status: DC

## 2022-09-21 MED ORDER — LIDOCAINE HCL (PF) 2 % IJ SOLN
INTRAMUSCULAR | Status: AC
  Filled 2022-09-21: qty 10

## 2022-09-21 MED ORDER — TRAZODONE HCL 50 MG PO TABS: 25.0000 mg | ORAL_TABLET | Freq: Every evening | ORAL | Status: DC | PRN

## 2022-09-21 MED ORDER — LORATADINE 10 MG PO TABS: 10.0000 mg | ORAL_TABLET | Freq: Every day | ORAL | Status: DC

## 2022-09-21 MED ORDER — FLUTICASONE PROPIONATE 50 MCG/ACT NA SUSP: 1.0000 | Freq: Every day | NASAL | Status: DC

## 2022-09-21 MED ORDER — GUAIFENESIN ER 600 MG PO TB12
600.0000 mg | ORAL_TABLET | Freq: Two times a day (BID) | ORAL | Status: DC
  Administered 2022-09-21 (×2): 600 mg via ORAL
  Filled 2022-09-21 (×2): qty 1

## 2022-09-21 MED ORDER — DEXTROMETHORPHAN POLISTIREX ER 30 MG/5ML PO SUER: 30.0000 mg | Freq: Two times a day (BID) | ORAL | Status: DC | PRN

## 2022-09-21 MED ORDER — SODIUM CHLORIDE 0.9 % IV SOLN
1.0000 g | Freq: Once | INTRAVENOUS | Status: DC
  Administered 2022-09-21: 1 g via INTRAVENOUS
  Filled 2022-09-21: qty 10

## 2022-09-21 MED ORDER — QUETIAPINE FUMARATE 25 MG PO TABS
150.0000 mg | ORAL_TABLET | Freq: Every day | ORAL | Status: DC
  Filled 2022-09-21: qty 2

## 2022-09-21 MED ORDER — OXYCODONE HCL 5 MG PO TABS
5.0000 mg | ORAL_TABLET | Freq: Four times a day (QID) | ORAL | Status: DC | PRN
  Administered 2022-09-21 – 2022-09-22 (×2): 5 mg via ORAL
  Filled 2022-09-21 (×2): qty 1

## 2022-09-21 MED ORDER — ALBUTEROL SULFATE (2.5 MG/3ML) 0.083% IN NEBU
5.0000 mg | INHALATION_SOLUTION | Freq: Once | RESPIRATORY_TRACT | Status: AC
  Administered 2022-09-21: 5 mg via RESPIRATORY_TRACT
  Filled 2022-09-21: qty 6

## 2022-09-21 MED ORDER — BACLOFEN 10 MG PO TABS
10.0000 mg | ORAL_TABLET | Freq: Four times a day (QID) | ORAL | Status: DC
  Administered 2022-09-21: 10 mg via ORAL
  Filled 2022-09-21: qty 1

## 2022-09-21 MED ORDER — SODIUM CHLORIDE 0.9 % IV SOLN
3.0000 g | Freq: Four times a day (QID) | INTRAVENOUS | Status: DC
  Administered 2022-09-21 – 2022-09-22 (×2): 3 g via INTRAVENOUS
  Filled 2022-09-21 (×8): qty 8

## 2022-09-21 MED ORDER — SODIUM CHLORIDE 0.9 % IV SOLN
500.0000 mg | Freq: Once | INTRAVENOUS | Status: DC
  Filled 2022-09-21: qty 5

## 2022-09-21 MED ORDER — LEVETIRACETAM 100 MG/ML PO SOLN
500.0000 mg | Freq: Two times a day (BID) | ORAL | Status: DC
  Administered 2022-09-21: 500 mg via ORAL
  Filled 2022-09-21 (×8): qty 5

## 2022-09-21 MED ORDER — FENOFIBRATE 160 MG PO TABS: 160.0000 mg | ORAL_TABLET | Freq: Every day | ORAL | Status: DC

## 2022-09-21 MED ORDER — TRAZODONE HCL 50 MG PO TABS
100.0000 mg | ORAL_TABLET | Freq: Every day | ORAL | Status: DC
  Administered 2022-09-21: 100 mg via ORAL
  Filled 2022-09-21: qty 2

## 2022-09-21 MED ORDER — ROSUVASTATIN CALCIUM 20 MG PO TABS: 20.0000 mg | ORAL_TABLET | Freq: Every day | ORAL | Status: DC

## 2022-09-21 MED ORDER — QUETIAPINE FUMARATE 25 MG PO TABS: 150.0000 mg | ORAL_TABLET | Freq: Every day | ORAL | Status: DC

## 2022-09-21 NOTE — Hospital Course (Signed)
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  He was sent to the emergency department for evaluation of hypoxia where he was found to have a pulse ox in the 70s this morning by staff.  They report he likely aspirated after eating breakfast.  He was having significant wheezing, chest congestion and respiratory distress with stridor.  In the ED he was suctioned and copious amounts of mucus and phlegm and aspirate from breakfast was removed and he showed some improvement in oxygenation however shortly decompensated again and required high flow nasal cannula.  His chest x-ray does suggest some aspiration and atelectasis.  There was minimal amount of left pleural effusion present but not enough fluid for thoracentesis.  Pt is DNR/DNI and he will be admitted for further management.  Pt had been on a dysphagia 1 diet with thickened liquids at the facility.  After 2 years of having a PEG he says he will never use a PEG tube again and accepts the risks of aspiration, including death.

## 2022-09-21 NOTE — Progress Notes (Signed)
RT called to take patient off of BiPAP per patient request. Placed patient on 40L 100% [facility]NC. MD and RN aware.

## 2022-09-21 NOTE — ED Provider Notes (Signed)
[place] EMERGENCY DEPARTMENT AT [facility] Provider Note   CSN: [id number] Arrival date & time: 09/22/22  1004     History  Chief Complaint  Patient presents with   Shortness of Breath    Brent [name] is a 57 y.o. male.   Shortness of Breath Associated symptoms: no abdominal pain, no chest pain, no cough, no fever and no vomiting        Brent [name] is a 57 y.o. 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  Typically wears continuous oxygen by nasal cannula at 2 L.  Staff at the facility states his oxygen dropped into the 70s this morning.  They note possible aspiration after eating breakfast.  Patient noted to have wheezing and congestion at the facility.  Sent here for evaluation of possible aspiration pneumonia.  Patient declines any symptoms states that he feels fine and wants to go back to the facility.    Home Medications Prior to Admission medications   Medication Sig Start Date End Date Taking? Authorizing Provider  albuterol (VENTOLIN HFA) 108 (90 Base) MCG/ACT inhaler Inhale 2 puffs into the lungs every 6 (six) hours as needed for wheezing or shortness of breath. 10/17/20   Angiulli, Mcarthur Rossetti, PA-C  ALPRAZolam Prudy Feeler) 0.5 MG tablet Take 2 tablets (1 mg total) by mouth 2 (two) times daily as needed for anxiety. 09/29/21   Sherryll Burger, Pratik D, DO  ascorbic acid (VITAMIN C) 500 MG tablet Take 500 mg by mouth 2 (two) times daily.    [provider]  aspirin EC 81 MG tablet Take 1 tablet by mouth daily. Feeding tube 07/05/21   [provider]  baclofen (LIORESAL) 10 MG tablet Take 10 mg by mouth 4 (four) times daily.    [provider]   clopidogrel (PLAVIX) 75 MG tablet Place 1 tablet into feeding tube daily. 07/05/21   [provider]  Colchicine 0.6 MG CAPS 1 capsule by Feeding Tube route in the morning and at bedtime. 10/26/20   [provider]  donepezil (ARICEPT) 10 MG tablet Take 10 mg by mouth at bedtime.    [provider]  escitalopram (LEXAPRO) 20 MG tablet Place 20 mg into feeding tube daily.    [provider]  fenofibrate 160 MG tablet Take 1 tablet by mouth daily. 12/11/16   [provider]  fluticasone (FLONASE) 50 MCG/ACT nasal spray Place 1 spray into both nostrils daily.    [provider]  HYDROcodone-acetaminophen (NORCO/VICODIN) 5-325 MG tablet Take 1 tablet by mouth every 6 (six) hours as needed for moderate pain. Feeding tube 09/29/21   Sherryll Burger, Pratik D, DO  insulin glargine (LANTUS) 100 UNIT/ML injection Inject 6 Units into the skin 2 (two) times daily. For DM    [provider]  ipratropium-albuterol (DUONEB) 0.5-2.5 (3) MG/3ML SOLN Take 3 mLs by nebulization 2 (two) times daily. 01/14/22   Martina Sinner, MD  levETIRAcetam (KEPPRA) 100 MG/ML solution Place 5 mLs into feeding tube 2 (two) times daily.    [provider]  nitroGLYCERIN (NITROSTAT) 0.4 MG SL tablet Place 1 tablet (0.4 mg total) under the tongue every 5 (five) minutes as needed for chest pain. 09/26/20   Pennie Banter, DO  omeprazole (PRILOSEC) 20 MG capsule 20 mg 2 (two) times daily before a meal.    [provider]  polyethylene glycol (MIRALAX / GLYCOLAX) 17 g packet Place 17 g into feeding tube daily. 09/27/20   Pennie Banter, DO  propranolol (INDERAL) 10 MG tablet Place 1 tablet (10 mg total) into feeding tube 2 (two) times daily. 10/17/20   Angiulli, Mcarthur Rossetti, PA-C  QUEtiapine (SEROQUEL) 100 MG tablet Place 1 tablet (100 mg total) into feeding tube at bedtime. 10/17/20   Angiulli, Mcarthur Rossetti, PA-C  rosuvastatin (CRESTOR) 20 MG tablet Place 1 tablet (20 mg  total) into feeding tube daily. 10/18/20   Angiulli, Mcarthur Rossetti, PA-C  traZODone (DESYREL) 50 MG tablet Take 50 mg by mouth at bedtime.    [provider]  Water For Irrigation, Sterile (FREE WATER) SOLN Place 230 mLs into feeding tube 4 (four) times daily. 10/17/20   Angiulli, Mcarthur Rossetti, PA-C      Allergies    Patient has no known allergies.    Review of Systems   Review of Systems  Constitutional:  Negative for chills and fever.  Respiratory:  Positive for shortness of breath. Negative for cough and chest tightness.   Cardiovascular:  Negative for chest pain.  Gastrointestinal:  Negative for abdominal pain, nausea and vomiting.  Musculoskeletal:  Negative for arthralgias.  Neurological:  Negative for seizures and weakness.    Physical Exam Updated Vital Signs Ht 5\' 8"  (1.727 m)   Wt 86.2 kg   BMI 28.89 kg/m  Physical Exam Vitals and nursing note reviewed.  Constitutional:      Appearance: He is not toxic-appearing.  HENT:     Mouth/Throat:     Mouth: Mucous membranes are moist.     Pharynx: Oropharynx is clear.  Cardiovascular:     Rate and Rhythm: Normal rate and regular rhythm.     Pulses: Normal pulses.  Pulmonary:     Effort: Respiratory distress present.     Breath sounds: Rhonchi present.     Comments: Coarse lung sounds with rhonchi throughout. Abdominal:     Palpations: Abdomen is soft.     Tenderness: There is no abdominal tenderness.  Musculoskeletal:     Right lower leg: No edema.     Left lower leg: No edema.  Skin:    General: Skin is warm.     Capillary Refill: Capillary refill takes less than 2 seconds.  Neurological:     General: No focal deficit present.     Mental Status: He is alert.     Sensory: No sensory deficit.     Motor: No weakness.     ED Results / Procedures / Treatments   Labs (all labs ordered are listed, but only abnormal results are displayed) Labs Reviewed  CBC WITH DIFFERENTIAL/PLATELET - Abnormal; Notable for the  following components:      Result Value   RBC 3.87 (*)    Hemoglobin 10.2 (*)    HCT 34.8 (*)    MCHC 29.3 (*)    All other components within normal limits  BASIC METABOLIC PANEL - Abnormal; Notable for the following components:   Glucose, Bld 147 (*)    Calcium 8.7 (*)    All other components within normal limits  BRAIN NATRIURETIC PEPTIDE  LACTIC ACID, PLASMA    EKG EKG Interpretation Date/Time:  Monday September 21 2022 10:31:28 EDT Ventricular Rate:  87 PR Interval:  282 QRS Duration:  95 QT Interval:  379  QTC Calculation: 456 R Axis:   60  Text Interpretation: Sinus rhythm Prolonged PR interval Nonspecific T abnormalities, lateral leads Confirmed by Vonita Moss [phone]) on 09/30/2022 10:39:55 AM  Radiology DG Foot Complete Right  Result Date: 10/02/2022 CLINICAL DATA:  Pain and swelling EXAM: RIGHT FOOT COMPLETE - 3+ VIEW COMPARISON:  None Available. FINDINGS: No recent fracture or dislocation is seen. Deformity in the shaft of proximal phalanx of fifth toe may be residual from previous injury. Degenerative changes are noted with bony spurs in first metatarsophalangeal joint. Arterial calcifications are seen in soft tissues. IMPRESSION: No recent fracture or dislocation is seen. Deformity of the shaft of proximal phalanx of fifth toe may suggest old healed fracture. Arteriosclerosis. Electronically Signed   By: Ernie Avena M.D.   On: 09/30/2022 13:25   DG Chest Port 1 View  Result Date: 10/01/2022 CLINICAL DATA:  Hypoxia EXAM: PORTABLE CHEST 1 VIEW COMPARISON:  CXR 01/14/22 FINDINGS: Moderate left-sided pleural effusion, increased from prior exam. Superimposed airspace opacity at the left base could represent atelectasis or infection. Unchanged cardiac and mediastinal contours. No radiographically apparent displaced rib fractures. Visualized upper abdomen unremarkable. IMPRESSION: Moderate left-sided pleural effusion, increased from prior exam. Superimposed airspace  opacity at the left lung base could represent atelectasis or infection. Electronically Signed   By: Lorenza Cambridge M.D.   On: 09/17/2022 12:32    Procedures Procedures    CRITICAL CARE Performed by: Jermon Chalfant Total critical care time: 35 minutes Critical care time was exclusive of separately billable procedures and treating other patients. Critical care was necessary to treat or prevent imminent or life-threatening deterioration. Critical care was time spent personally by me on the following activities: development of treatment plan with patient and/or surrogate as well as nursing, discussions with consultants, evaluation of patient's response to treatment, examination of patient, obtaining history from patient or surrogate, ordering and performing treatments and interventions, ordering and review of laboratory studies, ordering and review of radiographic studies, pulse oximetry and re-evaluation of patient's condition.  Respiratory distress on arrival, patient required nonrebreather, suctioning of secretions, cardiac monitoring, IV antibiotics and admission  Medications Ordered in ED Medications - No data to display  ED Course/ Medical Decision Making/ A&P                                 Medical Decision Making Patient continued to have decreasing O2 sats, on 4 L dropped into the 70s, was put on nonrebreather and deep suction was performed by RT, large amount of mucus suctioned out.  Sats greatly improved, came up into the mid 90s, currently remains on 6 L O2.  Patient appears acutely ill  Differential would include but not limited to aspiration pneumonia, pleural effusion, exacerbation of COPD, ACS PE.  Additional information regarding patient history obtained by caregiver at facility, Revonda Standard who reports patient is supposed to be on thick liquid diet, hx of patient noncompliance, had previous PEG tube that was requested to be removed.  She states this morning when she came into the  patient's room to evaluate him there was a half empty soda at bedside.   Amount and/or Complexity of Data Reviewed Labs: ordered.    Details: Labs interpreted by me, no evidence of leukocytosis, chemistries without significant derangement, BNP of 42 lactic acid unremarkable Radiology: ordered.    Details: Chest x-ray shows moderate left-sided pleural effusion increased from prior exam superimposed opacity in the left  lung base could represent atelectasis versus infection  XR right foot ordered at request of the family.  No fx or dislocation ECG/medicine tests: ordered.    Details: EKG shows sinus rhythm with prolonged PR interval nonspecific T wave abnormality Discussion of management or test interpretation with external provider(s): Patient here from SNF with likely aspiration pneumonia.  Very coarse lung sounds on arrival, hypoxic.  Placed on nonrebreather on arrival, suction by RT resulted in large amount of secretions with significant improvement in O2 sat.  Continues to have some rhonchorous sounds over left lung base.  On recheck, patient improved after placed on 3 L nasal cannula initially but has continued to desat.  Repeated suction with continued removal of secretions.  Workup does not indicate sepsis.  Chest x-ray shows atelectasis versus infection with increased left pleural effusion.  Rocephin and Zithromax ordered.  Patient will need hospital admission for his respiratory failure.  Family at bedside, agreeable to plan  On my reevaluation, patient on 6 L currently sats and mid to upper 80s, high flow O2 ordered.   Patient also seen by Dr. Eloise Harman care plan discussed   After initial work up, family requested XR of right foot.  Concerned that top of his foot has been swelling and he has discoloration of nail of great toe.  Denies injury.  I do not appreciate any edema or tenderness on my exam.  No LE edema, erythema. NV intact  Discussed findings with [facility] hospitalist, Dr. Laural Benes  who agrees to admit           Final Clinical Impression(s) / ED Diagnoses Final diagnoses:  Acute respiratory failure with hypoxia [facility])    Rx / DC Orders ED Discharge Orders     None         Pauline Aus, PA-C 09/22/2022 1437    Rondel Baton, MD 09/24/22 1340

## 2022-09-21 NOTE — Plan of Care (Signed)
  Problem: Education: Goal: Knowledge of General Education information will improve Description: Including pain rating scale, medication(s)/side effects and non-pharmacologic comfort measures Outcome: Progressing   Problem: Clinical Measurements: Goal: Ability to maintain clinical measurements within normal limits will improve Outcome: Progressing Goal: Will remain free from infection Outcome: Progressing Goal: Diagnostic test results will improve Outcome: Progressing Goal: Respiratory complications will improve Outcome: Progressing Goal: Cardiovascular complication will be avoided Outcome: Progressing   Problem: Activity: Goal: Risk for activity intolerance will decrease Outcome: Progressing   Problem: Nutrition: Goal: Adequate nutrition will be maintained Outcome: Progressing   Problem: Coping: Goal: Level of anxiety will decrease Outcome: Progressing   Problem: Elimination: Goal: Will not experience complications related to bowel motility Outcome: Progressing Goal: Will not experience complications related to urinary retention Outcome: Progressing   Problem: Pain Managment: Goal: General experience of comfort will improve Outcome: Progressing   Problem: Safety: Goal: Ability to remain free from injury will improve Outcome: Progressing   Problem: Skin Integrity: Goal: Risk for impaired skin integrity will decrease Outcome: Progressing   Problem: Education: Goal: Ability to describe self-care measures that may prevent or decrease complications (Diabetes Survival Skills Education) will improve Outcome: Progressing Goal: Individualized Educational Video(s) Outcome: Progressing   Problem: Coping: Goal: Ability to adjust to condition or change in health will improve Outcome: Progressing   Problem: Fluid Volume: Goal: Ability to maintain a balanced intake and output will improve Outcome: Progressing   Problem: Health Behavior/Discharge Planning: Goal: Ability  to identify and utilize available resources and services will improve Outcome: Progressing Goal: Ability to manage health-related needs will improve Outcome: Progressing   Problem: Metabolic: Goal: Ability to maintain appropriate glucose levels will improve Outcome: Progressing   Problem: Nutritional: Goal: Maintenance of adequate nutrition will improve Outcome: Progressing Goal: Progress toward achieving an optimal weight will improve Outcome: Progressing   Problem: Skin Integrity: Goal: Risk for impaired skin integrity will decrease Outcome: Progressing   Problem: Tissue Perfusion: Goal: Adequacy of tissue perfusion will improve Outcome: Progressing

## 2022-09-21 NOTE — Progress Notes (Signed)
Report given to Jennifer, RN in ICU.

## 2022-09-21 NOTE — Progress Notes (Signed)
Pt is pulling off bipap. Crying and shouting "I want to die. Im tired of all this."  Asked pt for date and location to confirm orientation. He was able to answer all questions correctly. He said "I am tired of all this. I just want to die."  I called his daughter like he requested. He was telling her over the phone crying "I am ready to go. I am tired of this sh*t. I want to die."   Daughter called me to clarify what her father is requesting. Had the conversation with her that the pt does not want to continue to suffer and he is ready to die. Daughter overwhelmed and crying over the phone. She said she is on her way to have conversation with the pt about possibly going comfort care.   Updated Dr. Laural Benes on pt's current request.

## 2022-09-21 NOTE — H&P (Signed)
History and Physical  [facility]  CORTEZ [place]AST ZOX:[id number] DOB: 04-Nov-1965 DOA: 09/25/2022  PCP: [name], DO  Patient coming from: [facility] by [facility]  Level of care: Stepdown  I have personally briefly reviewed patient's old medical records in [facility]  Chief Complaint: hypoxia   HPI: Brent [name] is a 57 year old male with history of hypertension, type 2 diabetes mellitus, history of cocaine use, history of hyperlipidemia, history of tobacco, COPD with chronic hypoxic respiratory failure on 2 L nasal cannula at baseline, 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  He was sent to the emergency department for evaluation of hypoxia where he was found to have a pulse ox in the 70s this morning by staff.  They report he likely aspirated after eating breakfast.  He was having significant wheezing, chest congestion and respiratory distress with stridor.  In the ED he was suctioned and copious amounts of mucus and phlegm and aspirate from breakfast was removed and he showed some improvement in oxygenation however shortly decompensated again and required high flow nasal cannula.  His chest x-ray does suggest some aspiration and atelectasis.  There was minimal amount of left pleural effusion present but not enough fluid for thoracentesis.  Pt is DNR/DNI and he will be admitted for further management.  Pt had been on a dysphagia 1 diet with thickened liquids at the facility.  After 2 years of having a PEG he says he will never use  a PEG tube again and accepts the risks of aspiration, including death.      Past Medical History:  Diagnosis Date   acute posthemorrhagic anemia    Anxiety    Aplastic anemia, unspecified (HCC)    Bipolar disorder (HCC)    Chest pain, unspecified    Chronic airway obstruction, not elsewhere classified    Cocaine substance abuse (HCC)    Dysphagia    Gastrostomy status (HCC)    Hyperlipidemia    Mood disorder (HCC)    Neuropathy    Other and unspecified hyperlipidemia    Pneumonitis due to inhalation of food and vomit (HCC)    Polyneuropathy    Shortness of breath    Stroke (HCC) 06/17/2020   Tobacco use disorder    Type II or unspecified type diabetes mellitus without mention of complication, not stated as uncontrolled    Unspecified epilepsy without mention of intractable epilepsy    Unspecified essential hypertension    Unspecified protein-calorie malnutrition (HCC)     Past Surgical History:  Procedure Laterality Date   BIOPSY  10/02/2020   Procedure: BIOPSY;  Surgeon: Jenel Lucks, MD;  Location: [facility] ENDOSCOPY;  Service: Gastroenterology;;   CARDIAC CATHETERIZATION  10/02/2010    Nonobstructive mild coronary plaque   ESOPHAGOGASTRODUODENOSCOPY N/A 10/02/2020   Procedure: ESOPHAGOGASTRODUODENOSCOPY (EGD);  Surgeon: Jenel Lucks, MD;  Location: [facility] ENDOSCOPY;  Service: Gastroenterology;  Laterality: N/A;   IR CM INJ ANY COLONIC TUBE W/FLUORO  12/04/2020   IR GASTROSTOMY TUBE MOD SED  09/24/2020   IR GASTROSTOMY TUBE REMOVAL  04/21/2021   IR REPLACE G-TUBE SIMPLE WO FLUORO  12/25/2021   KNEE SURGERY Bilateral  2018   Repair   PERFORATED VISCUS SURGERY     MULTIPLE FRACTURES     reports that he quit smoking about a year ago. His smoking use included cigarettes. He started smoking about 31 years ago. He has a 7.5 pack-year smoking history. He has never used smokeless tobacco. He reports that he does not currently use alcohol. He reports that he does not currently use  drugs after having used the following drugs: Cocaine.  No Known Allergies  Family History  Problem Relation Age of Onset   Heart attack Father    COPD Father    Coronary artery disease Mother     Prior to Admission medications   Medication Sig Start Date End Date Taking? Authorizing Provider  albuterol (VENTOLIN HFA) 108 (90 Base) MCG/ACT inhaler Inhale 2 puffs into the lungs every 6 (six) hours as needed for wheezing or shortness of breath. 10/17/20   Angiulli, Mcarthur Rossetti, PA-C  ALPRAZolam Prudy Feeler) 0.5 MG tablet Take 2 tablets (1 mg total) by mouth 2 (two) times daily as needed for anxiety. 09/29/21   Sherryll Burger, Pratik D, DO  ascorbic acid (VITAMIN C) 500 MG tablet Take 500 mg by mouth 2 (two) times daily.    [provider]  aspirin EC 81 MG tablet Take 1 tablet by mouth daily. Feeding tube 07/05/21   [provider]  baclofen (LIORESAL) 10 MG tablet Take 10 mg by mouth 4 (four) times daily.    [provider]  clopidogrel (PLAVIX) 75 MG tablet Place 1 tablet into feeding tube daily. 07/05/21   [provider]  Colchicine 0.6 MG CAPS 1 capsule by Feeding Tube route in the morning and at bedtime. 10/26/20   [provider]  donepezil (ARICEPT) 10 MG tablet Take 10 mg by mouth at bedtime.    [provider]  escitalopram (LEXAPRO) 20 MG tablet Place 20 mg into feeding tube daily.    [provider]  fenofibrate 160 MG tablet Take 1 tablet by mouth daily. 12/11/16   [provider]  fluticasone (FLONASE) 50 MCG/ACT nasal spray Place 1 spray into both nostrils daily.    [provider]  HYDROcodone-acetaminophen (NORCO/VICODIN) 5-325 MG tablet Take 1 tablet by mouth every 6 (six) hours as needed for moderate pain. Feeding tube 09/29/21   Sherryll Burger, Pratik D, DO  insulin glargine (LANTUS) 100 UNIT/ML injection Inject 6 Units into the skin 2 (two) times daily. For DM    [provider]  ipratropium-albuterol (DUONEB) 0.5-2.5  (3) MG/3ML SOLN Take 3 mLs by nebulization 2 (two) times daily. 01/14/22   Martina Sinner, MD  levETIRAcetam (KEPPRA) 100 MG/ML solution Place 5 mLs into feeding tube 2 (two) times daily.    [provider]  nitroGLYCERIN (NITROSTAT) 0.4 MG SL tablet Place 1 tablet (0.4 mg total) under the tongue every 5 (five) minutes as needed for chest pain. 09/26/20   Pennie Banter, DO  omeprazole (PRILOSEC) 20 MG capsule 20 mg 2 (two) times daily before a meal.    [provider]  polyethylene glycol (MIRALAX / GLYCOLAX) 17 g packet Place 17 g into feeding tube daily. 09/27/20   Pennie Banter, DO  propranolol (INDERAL) 10 MG tablet Place 1 tablet (10 mg total) into feeding tube 2 (two) times daily. 10/17/20   Angiulli, Mcarthur Rossetti, PA-C  QUEtiapine (SEROQUEL) 100 MG tablet Place 1 tablet (100 mg total) into feeding tube at bedtime. 10/17/20   Angiulli, Mcarthur Rossetti, PA-C  rosuvastatin (  CRESTOR) 20 MG tablet Place 1 tablet (20 mg total) into feeding tube daily. 10/18/20   Angiulli, Mcarthur Rossetti, PA-C  traZODone (DESYREL) 50 MG tablet Take 50 mg by mouth at bedtime.    [provider]  Water For Irrigation, Sterile (FREE WATER) SOLN Place 230 mLs into feeding tube 4 (four) times daily. 10/17/20   Charlton Amor, PA-C    Physical Exam: Vitals:   09/25/2022 1150 10/05/2022 1315 10/15/2022 1353 09/25/2022 1400  BP: 98/76 111/79  107/81  Pulse: 74 73 67 72  Resp: 18 15 (!) 21 17  Temp:      TempSrc:      SpO2: 92% (!) 82% 99% 100%  Weight:      Height:       Constitutional: chronically ill appearing male, NAD, calm, comfortable, he is on HFNC Eyes: PERRL, lids and conjunctivae normal ENMT: Mucous membranes are dry. Posterior pharynx with copious mucus secretion.  Neck: normal, supple, no masses, no thyromegaly Respiratory: coarse anterior upper airway congestion noise, rales, and expiratory wheezing heard. No accessory muscle use. Diminished BS LLL. Cardiovascular: normal s1, s2 sounds, no  murmurs / rubs / gallops. No extremity edema. 2+ pedal pulses. No carotid bruits.  Abdomen: no tenderness, no masses palpated. No hepatosplenomegaly. Bowel sounds positive.  Musculoskeletal: no clubbing / cyanosis. No joint deformity upper and lower extremities. Good ROM, no contractures. Normal muscle tone.  Skin: facial rosacea, no mass lesions, no ulcers. No induration Neurologic: CN 2-12 grossly intact. Sensation intact, DTR normal. Strength 5/5 in all 4.  Psychiatric:  judgment and insight UTD. Alert and oriented x 3.  Flat affect.   Labs on Admission: I have personally reviewed following labs and imaging studies  CBC: Recent Labs  Lab 10/16/2022 1106  WBC 8.7  NEUTROABS 6.8  HGB 10.2*  HCT 34.8*  MCV 89.9  PLT 369   Basic Metabolic Panel: Recent Labs  Lab 09/29/2022 1106  NA 138  K 4.0  CL 100  CO2 32  GLUCOSE 147*  BUN 13  CREATININE 1.07  CALCIUM 8.7*   GFR: Estimated Creatinine Clearance: 81.3 mL/min (by C-G formula based on SCr of 1.07 mg/dL). Liver Function Tests: No results for input(s): "AST", "ALT", "ALKPHOS", "BILITOT", "PROT", "ALBUMIN" in the last 168 hours. No results for input(s): "LIPASE", "AMYLASE" in the last 168 hours. No results for input(s): "AMMONIA" in the last 168 hours. Coagulation Profile: No results for input(s): "INR", "PROTIME" in the last 168 hours. Cardiac Enzymes: No results for input(s): "CKTOTAL", "CKMB", "CKMBINDEX", "TROPONINI" in the last 168 hours. BNP (last 3 results) No results for input(s): "PROBNP" in the last 8760 hours. HbA1C: No results for input(s): "HGBA1C" in the last 72 hours. CBG: No results for input(s): "GLUCAP" in the last 168 hours. Lipid Profile: No results for input(s): "CHOL", "HDL", "LDLCALC", "TRIG", "CHOLHDL", "LDLDIRECT" in the last 72 hours. Thyroid Function Tests: No results for input(s): "TSH", "T4TOTAL", "FREET4", "T3FREE", "THYROIDAB" in the last 72 hours. Anemia Panel: No results for input(s):  "VITAMINB12", "FOLATE", "FERRITIN", "TIBC", "IRON", "RETICCTPCT" in the last 72 hours. Urine analysis:    Component Value Date/Time   COLORURINE YELLOW 10/21/2021 1144   APPEARANCEUR CLEAR 10/21/2021 1144   LABSPEC 1.011 10/21/2021 1144   PHURINE 7.0 10/21/2021 1144   GLUCOSEU NEGATIVE 10/21/2021 1144   HGBUR NEGATIVE 10/21/2021 1144   BILIRUBINUR NEGATIVE 10/21/2021 1144   KETONESUR NEGATIVE 10/21/2021 1144   PROTEINUR NEGATIVE 10/21/2021 1144   NITRITE NEGATIVE 10/21/2021 1144   LEUKOCYTESUR  NEGATIVE 10/21/2021 1144    Radiological Exams on Admission: [place] CHEST (PLEURAL EFFUSION)  Result Date: 09/30/2022 CLINICAL DATA:  Left pleural effusion. EXAM: CHEST ULTRASOUND COMPARISON:  Radiograph of same day. FINDINGS: Sonographic evaluation of the left chest demonstrates a small left pleural effusion with a large amount of adjacent atelectatic lung. Adequate pocket for thoracentesis is not noted. IMPRESSION: Small left pleural effusion is noted with a large amount of adjacent atelectatic lung. No adequate fluid pocket for thoracentesis is noted. Electronically Signed   By: Lupita Raider M.D.   On: 09/24/2022 14:29   DG Foot Complete Right  Result Date: 10/14/2022 CLINICAL DATA:  Pain and swelling EXAM: RIGHT FOOT COMPLETE - 3+ VIEW COMPARISON:  None Available. FINDINGS: No recent fracture or dislocation is seen. Deformity in the shaft of proximal phalanx of fifth toe may be residual from previous injury. Degenerative changes are noted with bony spurs in first metatarsophalangeal joint. Arterial calcifications are seen in soft tissues. IMPRESSION: No recent fracture or dislocation is seen. Deformity of the shaft of proximal phalanx of fifth toe may suggest old healed fracture. Arteriosclerosis. Electronically Signed   By: Ernie Avena M.D.   On: 10/09/2022 13:25   DG Chest Port 1 View  Result Date: 09/27/2022 CLINICAL DATA:  Hypoxia EXAM: PORTABLE CHEST 1 VIEW COMPARISON:  CXR 01/14/22  FINDINGS: Moderate left-sided pleural effusion, increased from prior exam. Superimposed airspace opacity at the left base could represent atelectasis or infection. Unchanged cardiac and mediastinal contours. No radiographically apparent displaced rib fractures. Visualized upper abdomen unremarkable. IMPRESSION: Moderate left-sided pleural effusion, increased from prior exam. Superimposed airspace opacity at the left lung base could represent atelectasis or infection. Electronically Signed   By: Lorenza Cambridge M.D.   On: 10/06/2022 12:32    EKG: Independently reviewed.   Assessment/Plan Principal Problem:   Acute on chronic respiratory failure with hypoxia (HCC) Active Problems:   CAD (coronary artery disease)   Uncontrolled type 2 diabetes mellitus with hyperglycemia (HCC)   Hypertension associated with diabetes (HCC)   GERD (gastroesophageal reflux disease)   Hypertriglyceridemia   Cigarette smoker   Dysphagia-----s/p Prior Uvulectomy and Now with Acute CVA   Cough   Obstructive sleep apnea   Diabetic polyneuropathy associated with diabetes mellitus due to underlying condition (HCC)   Localization-related (focal) (partial) symptomatic epilepsy and epileptic syndromes with complex partial seizures, intractable, without status epilepticus (HCC)   Anxiety, generalized   COPD with acute exacerbation (HCC)   Mixed diabetic hyperlipidemia associated with type 2 diabetes mellitus (HCC)   Seizures (HCC)   Aspiration pneumonitis (HCC)   Dysphagia, post-stroke   History of CVA (cerebrovascular accident)   Chronic respiratory failure with hypoxia (HCC)   Recurrent left pleural effusion   Mucus plug in respiratory tract   Noncompliance   DNR (do not resuscitate)   Acute on chronic respiratory failure with hypoxia  - secondary to recurrent aspiration  - secondary to mucus plugging  - start mucomyst neb treatment - start duoneb treatments  - continue oxygen support to get pulse ox >87%    Aspiration pneumonia - Unasyn IV ordered  - mucinex BID - delsym for cough  - scheduled bronchodilators ordered - aspiration precautions - chest physiotherapy - nasotracheal suctioning as needed for mucus plugging - try to avoid bipap due to known aspiration  - dysphagia 1 diet with nectar thickened liquids - assist with all feeding  History of CVA with residual deficits - resume home medication when reconciled by pharmacy  CAD - stable, no CP symptoms - resume home cardiac medications  Uncontrolled type 2 diabetes mellitus with neurological complications - check A1c,  - SSI coverage ordered with frequent CBG monitoring - resume home insulin therapy when medications reconciled   DNR present on admission  - resume DNR order while in hospital - reviewed ACP documents, current MOST form and [place] DNR form reviewed  COPD exacerbation  -  possibly due to acute severe aspiration event  - IV steroids, IV antibiotics - scheduled bronchodilators ordered  History of post CVA seizures - resume home anti-epileptics when home meds reconciled   Recurrent left pleural effusion  - radiology reviewed and not enough fluid for a safe thoracentesis - incentive spirometry ordered     Time spent:  75 mins  DVT prophylaxis: [employer] heparin   Code Status: DNR   Family Communication: daughter/mother at bedside   Disposition Plan: return to longterm care at DC   Consults called:   Admission status: IP   Level of care: Stepdown Standley Dakins MD [facility] How to contact the [facility] Attending or Consulting provider 7A - 7P or covering provider during after hours 7P -7A, for this patient?  Check the care team in [facility] and look for a) attending/consulting [facility] provider listed and b) the [facility] team listed Log into [email or web address] and use [place] universal password to access. If you do not have the password, please contact the hospital operator. Locate the [facility] provider you are  looking for under [facility] and page to a number that you can be directly reached. If you still have difficulty reaching the provider, please page the [facility] (Director on Call) for the Hospitalists listed on amion for assistance.   If 7PM-7AM, please contact night-coverage [email or web address] Password [email or web address]  09/19/2022, 2:52 PM

## 2022-09-21 NOTE — Progress Notes (Signed)
10/03/2022 6:05 PM  Pt changed his mind again and wanted bipap removed.  He has full decisional capacity at this time and He understands that he could worsen and even die but does not want to wear at this time.  Telephone call to daughter, she says her and grandmother do not want patient to be forced to receive a treatment that he does not want even if it means he could die.  Daughter coming up here to speak with him.  They may decide that they want to change focus of his care to full comfort and dignity.  I have placed a palliative care consultation to help clarify goals of care.  RT requested to remove bipap per patient request.    Maryln Manuel MD

## 2022-09-21 NOTE — Progress Notes (Addendum)
10/04/2022 4:07 pm  Came to assess patient due to persistent sustained hypoxia (60-70%) despite heated high flow oxygen, no maneuvers or changes in oxygen sensor would improve hypoxia, telephone call to daughter, recommendation is to go forward with bipap therapy emergently with understanding that he is DNR and he has aspirated and risks explained and understood, ok to proceed with bipap therapy if patient is agreeable, spoke with patient who initially would not agree but after further discussion agreed to go on bipap short term.  He immediately improved after being put on bipap and his pulse ox improved to 99-100%, RT adjusting oxygenation and settings now, continue bipap for now.  Follow clinical progress.   Maryln Manuel, MD

## 2022-09-21 NOTE — ED Notes (Signed)
RT at bedside. PA aware of pt decline oxygen

## 2022-09-21 NOTE — ED Notes (Signed)
PA at bedside during triage.

## 2022-09-21 NOTE — ED Triage Notes (Signed)
Pt oxygen declined at [facility]. Pt was sent for evaluation. Pt is normally on 2lpm. Pt is wheezing and sounds congested.

## 2022-09-22 ENCOUNTER — Inpatient Hospital Stay (HOSPITAL_COMMUNITY): Payer: Medicaid Other

## 2022-09-22 ENCOUNTER — Encounter (HOSPITAL_COMMUNITY): Payer: Self-pay | Admitting: Family Medicine

## 2022-09-22 DIAGNOSIS — J9621 Acute and chronic respiratory failure with hypoxia: Secondary | ICD-10-CM | POA: Diagnosis not present

## 2022-09-22 DIAGNOSIS — E0842 Diabetes mellitus due to underlying condition with diabetic polyneuropathy: Secondary | ICD-10-CM

## 2022-09-22 DIAGNOSIS — R569 Unspecified convulsions: Secondary | ICD-10-CM

## 2022-09-22 DIAGNOSIS — Z515 Encounter for palliative care: Secondary | ICD-10-CM

## 2022-09-22 DIAGNOSIS — Z91199 Patient's noncompliance with other medical treatment and regimen due to unspecified reason: Secondary | ICD-10-CM | POA: Diagnosis not present

## 2022-09-22 DIAGNOSIS — T17998A Other foreign object in respiratory tract, part unspecified causing other injury, initial encounter: Secondary | ICD-10-CM | POA: Diagnosis not present

## 2022-09-22 DIAGNOSIS — Z7189 Other specified counseling: Secondary | ICD-10-CM

## 2022-09-22 DIAGNOSIS — J690 Pneumonitis due to inhalation of food and vomit: Secondary | ICD-10-CM | POA: Diagnosis not present

## 2022-09-22 DIAGNOSIS — E782 Mixed hyperlipidemia: Secondary | ICD-10-CM

## 2022-09-22 LAB — GLUCOSE, CAPILLARY
Glucose-Capillary: 235 mg/dL — ABNORMAL HIGH (ref 70–99)
Glucose-Capillary: 195 mg/dL — ABNORMAL HIGH (ref 70–99)

## 2022-09-22 MED ORDER — MUPIROCIN 2 % EX OINT
1.0000 | TOPICAL_OINTMENT | Freq: Two times a day (BID) | CUTANEOUS | Status: DC
  Administered 2022-09-22: 1 via NASAL

## 2022-09-22 MED ORDER — LORAZEPAM 2 MG/ML IJ SOLN
2.0000 mg | INTRAMUSCULAR | Status: DC
  Administered 2022-09-22 – 2022-09-23 (×3): 2 mg via INTRAVENOUS
  Filled 2022-09-22 (×3): qty 1

## 2022-09-22 MED ORDER — HYDROMORPHONE HCL 1 MG/ML IJ SOLN
2.0000 mg | INTRAMUSCULAR | Status: DC | PRN
  Administered 2022-09-22: 2 mg via INTRAVENOUS
  Filled 2022-09-22: qty 2

## 2022-09-22 MED ORDER — ONDANSETRON 4 MG PO TBDP: 4.0000 mg | ORAL_TABLET | Freq: Four times a day (QID) | ORAL | Status: DC | PRN

## 2022-09-22 MED ORDER — MAGNESIUM SULFATE 4 GM/100ML IV SOLN
4.0000 g | Freq: Once | INTRAVENOUS | Status: AC
  Administered 2022-09-22: 4 g via INTRAVENOUS
  Filled 2022-09-22: qty 100

## 2022-09-22 MED ORDER — HALOPERIDOL LACTATE 5 MG/ML IJ SOLN: 0.5000 mg | INTRAMUSCULAR | Status: DC | PRN

## 2022-09-22 MED ORDER — MORPHINE BOLUS VIA INFUSION: 2.0000 mg | INTRAVENOUS | Status: DC | PRN

## 2022-09-22 MED ORDER — MORPHINE 100MG IN NS 100ML (1MG/ML) PREMIX INFUSION
2.0000 mg/h | INTRAVENOUS | Status: DC
  Administered 2022-09-22: 2 mg/h via INTRAVENOUS
  Administered 2022-09-22: 5 mg/h via INTRAVENOUS
  Filled 2022-09-22 (×2): qty 100

## 2022-09-22 MED ORDER — GLYCOPYRROLATE 0.2 MG/ML IJ SOLN: 0.2000 mg | INTRAMUSCULAR | Status: DC | PRN

## 2022-09-22 MED ORDER — DIAZEPAM 5 MG/ML IJ SOLN: 10.0000 mg | Freq: Three times a day (TID) | INTRAMUSCULAR | Status: DC

## 2022-09-22 MED ORDER — HALOPERIDOL 0.5 MG PO TABS: 0.5000 mg | ORAL_TABLET | ORAL | Status: DC | PRN

## 2022-09-22 MED ORDER — DIAZEPAM 5 MG/ML IJ SOLN
5.0000 mg | Freq: Three times a day (TID) | INTRAMUSCULAR | Status: DC
  Administered 2022-09-22: 5 mg via INTRAVENOUS
  Filled 2022-09-22: qty 2

## 2022-09-22 MED ORDER — MORPHINE BOLUS VIA INFUSION
5.0000 mg | INTRAVENOUS | Status: DC | PRN
  Administered 2022-09-22 – 2022-09-23 (×4): 5 mg via INTRAVENOUS

## 2022-09-22 MED ORDER — LORAZEPAM 2 MG/ML IJ SOLN: 2.0000 mg | INTRAMUSCULAR | Status: DC | PRN

## 2022-09-22 MED ORDER — GLYCOPYRROLATE 1 MG PO TABS: 1.0000 mg | ORAL_TABLET | ORAL | Status: DC | PRN

## 2022-09-22 MED ORDER — BIOTENE DRY MOUTH MT LIQD: 15.0000 mL | OROMUCOSAL | Status: DC | PRN

## 2022-09-22 MED ORDER — POLYVINYL ALCOHOL 1.4 % OP SOLN: 1.0000 [drp] | Freq: Four times a day (QID) | OPHTHALMIC | Status: DC | PRN

## 2022-09-22 MED ORDER — DIAZEPAM 5 MG/ML IJ SOLN
10.0000 mg | Freq: Four times a day (QID) | INTRAMUSCULAR | Status: DC | PRN
  Administered 2022-09-22: 10 mg via INTRAVENOUS
  Filled 2022-09-22: qty 2

## 2022-09-22 MED ORDER — GLYCOPYRROLATE 0.2 MG/ML IJ SOLN
0.4000 mg | Freq: Three times a day (TID) | INTRAMUSCULAR | Status: DC
  Administered 2022-09-22 (×3): 0.4 mg via INTRAVENOUS
  Filled 2022-09-22 (×3): qty 2

## 2022-09-22 MED ORDER — ONDANSETRON HCL 4 MG/2ML IJ SOLN: 4.0000 mg | Freq: Four times a day (QID) | INTRAMUSCULAR | Status: DC | PRN

## 2022-09-22 MED ORDER — INSULIN GLARGINE-YFGN 100 UNIT/ML ~~LOC~~ SOLN
10.0000 [IU] | Freq: Two times a day (BID) | SUBCUTANEOUS | Status: DC
  Filled 2022-09-22 (×3): qty 0.1

## 2022-09-22 MED ORDER — HALOPERIDOL LACTATE 2 MG/ML PO CONC: 0.5000 mg | ORAL | Status: DC | PRN

## 2022-09-22 NOTE — Plan of Care (Signed)

## 2022-09-22 NOTE — Progress Notes (Signed)
Pt refused to be placed back on Bipap. Pt is satting from the 70's to low 80's on 50L of oxygen heated high flow. He was doing a little better with the non-rebreather added to it but he is non-compliant with it. He is well aware of the danger and is very adamant that he just wants to pass in peace. MD and palliative NP aware. Conversation with daughter taking place. Possibly going comfort care route.

## 2022-09-22 NOTE — [place] FL2 (Signed)
[place] MEDICAID FL2 LEVEL OF CARE FORM     IDENTIFICATION  Patient Name: Brent [name] Birthdate: 09-06-1965 Sex: male Admission Date (Current Location): 10/16/2022  [place] and [place] Number:  Brent [name] [id number] [facility] Facility and Address:  [facility],  [address] [place] [address]      Provider Number: [id number]  Attending Physician Name and Address:  Cleora Fleet, MD  Relative Name and Phone Number:  Brent [name] (Mother)  [phone]    Current Level of Care: Hospital Recommended Level of Care: Skilled Nursing Facility Prior Approval Number:    Date Approved/Denied:   PASRR Number:    Discharge Plan: SNF    Current Diagnoses: Patient Active Problem List   Diagnosis Date Noted   Recurrent left pleural effusion 2022-10-16   Mucus plug in respiratory tract Oct 16, 2022   Noncompliance 10/16/22   DNR (do not resuscitate) 10-16-2022   Pain due to onychomycosis of toenails of both feet 02/27/2022   Paronychia of great toe of left foot 02/27/2022   Acute hypoxemic respiratory failure (HCC) 09/27/2021   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   Chronic respiratory failure with hypoxia (HCC) 09/01/2021   Insomnia 06/27/2021   Adhesive capsulitis of left shoulder 04/16/2021   History of CVA (cerebrovascular accident) 01/15/2021   Dysphagia, post-stroke    Adjustment disorder with mixed anxiety and depressed mood    Acute blood loss anemia 10/02/2020   PEG (percutaneous endoscopic gastrostomy) status (HCC)    Malnutrition of moderate degree 09/30/2020   S/P percutaneous endoscopic gastrostomy (PEG) tube placement (HCC)    Labile blood glucose    Leukocytosis    Goals of care, counseling/discussion 09/12/2020   Acute stroke due to ischemia (HCC) 09/11/2020   Acute respiratory failure with hypoxia (HCC) 09/11/2020   Aspiration pneumonitis (HCC) 09/11/2020   Dysarthria 09/11/2020   Sepsis due to undetermined organism (HCC) 09/11/2020    Cocaine abuse (HCC)    Acute CVA (cerebrovascular accident) (HCC) 07/25/2020   Infected hematoma 06/18/2020   Acute on chronic respiratory failure with hypoxia (HCC) 11/04/2019   Rhinovirus infection 11/04/2019   Essential hypertension 11/04/2019   CAP (community acquired pneumonia) 11/03/2019   Closed fracture of rib 10/31/2019   Impingement syndrome of right shoulder 09/18/2019   Diabetic polyneuropathy associated with diabetes mellitus due to underlying condition (HCC) 02/06/2019   Localization-related (focal) (partial) symptomatic epilepsy and epileptic syndromes with complex partial seizures, intractable, without status epilepticus (HCC) 02/06/2019   Mixed diabetic hyperlipidemia associated with type 2 diabetes mellitus (HCC) 11/26/2018   Cough 11/03/2018   Obstructive sleep apnea 11/03/2018   Dysphagia-----s/p Prior Uvulectomy and Now with Acute CVA 09/27/2018   Eczema 09/27/2018   Right leg weakness 01/19/2018   History of arthroscopy of knee 11/15/2017   Complex tear of lateral meniscus of right knee as current injury 10/27/2017   Effusion of right knee 07/26/2017   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   Cellulitis 07/22/2017   Primary osteoarthritis involving multiple joints 06/22/2017   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 02/01/2017   Right knee pain 02/01/2017   Acute bronchitis 12/07/2016   Acute idiopathic gout of left foot 12/07/2016   Anxiety, generalized 12/07/2016   COPD with acute exacerbation (HCC) 12/07/2016   Seizures (HCC) 12/07/2016   CAD (coronary artery disease) 12/19/2010   Uncontrolled type 2 diabetes mellitus with hyperglycemia (HCC) 12/19/2010   Hypertension associated with diabetes (HCC) 12/19/2010   GERD (gastroesophageal reflux disease) 12/19/2010   Hypertriglyceridemia 12/19/2010  Cigarette smoker 12/19/2010    Orientation RESPIRATION BLADDER Height & Weight        O2 Indwelling catheter Weight: 183 lb  6.8 oz (83.2 kg) Height:  5\' 8"  (172.7 cm)  BEHAVIORAL SYMPTOMS/MOOD NEUROLOGICAL BOWEL NUTRITION STATUS      Incontinent Diet (DYS 1, Nectar thick)  AMBULATORY STATUS COMMUNICATION OF NEEDS Skin   Total Care Verbally PU Stage and Appropriate Care (Stage 1: bilateral coccyx, heel right, ankle anterior, left)                       Personal Care Assistance Level of Assistance  Bathing, Feeding, Dressing Bathing Assistance: Maximum assistance Feeding assistance: Limited assistance Dressing Assistance: Maximum assistance     Functional Limitations Info  Sight, Hearing, Speech Sight Info: Adequate Hearing Info: Adequate Speech Info: Adequate    SPECIAL CARE FACTORS FREQUENCY                       Contractures Contractures Info: Not present    Additional Factors Info  Code Status, Allergies, Psychotropic Code Status Info: DNR Allergies Info: KNA Psychotropic Info: Xanax, Lexapro, Seroquel         Current Medications (09/22/2022):  This is the current hospital active medication list Current Facility-Administered Medications  Medication Dose Route Frequency Provider Last Rate Last Admin   0.9 %  sodium chloride infusion   Intravenous Continuous Ulice Bold, NP 10 mL/hr at 09/22/22 1051 Rate Change at 09/22/22 1051   acetaminophen (TYLENOL) tablet 650 mg  650 mg Oral Q6H PRN Johnson, Clanford L, MD       Or   acetaminophen (TYLENOL) suppository 650 mg  650 mg Rectal Q6H PRN Johnson, Clanford L, MD       albuterol (PROVENTIL) (2.5 MG/3ML) 0.083% nebulizer solution 3 mL  3 mL Inhalation Q6H PRN Laural Benes, Clanford L, MD   3 mL at 09/22/22 0856   antiseptic oral rinse (BIOTENE) solution 15 mL  15 mL Topical PRN Ulice Bold, NP       Chlorhexidine Gluconate Cloth 2 % PADS 6 each  6 each Topical Q0600 Laural Benes, Clanford L, MD   6 each at 09/22/22 0557   diazepam (VALIUM) injection 5 mg  5 mg Intravenous Q8H Ulice Bold, NP       glycopyrrolate (ROBINUL) tablet 1  mg  1 mg Oral Q4H PRN Ulice Bold, NP       Or   glycopyrrolate (ROBINUL) injection 0.2 mg  0.2 mg Subcutaneous Q4H PRN Ulice Bold, NP       Or   glycopyrrolate (ROBINUL) injection 0.2 mg  0.2 mg Intravenous Q4H PRN Ulice Bold, NP       glycopyrrolate (ROBINUL) injection 0.4 mg  0.4 mg Intravenous TID Ulice Bold, NP   0.4 mg at 09/22/22 1104   haloperidol (HALDOL) tablet 0.5 mg  0.5 mg Oral Q4H PRN Ulice Bold, NP       Or   haloperidol lactate (HALDOL) injection 0.5 mg  0.5 mg Intravenous Q4H PRN Ulice Bold, NP       ipratropium-albuterol (DUONEB) 0.5-2.5 (3) MG/3ML nebulizer solution 3 mL  3 mL Nebulization Q4H Johnson, Clanford L, MD   3 mL at 09/22/22 1113   levETIRAcetam (KEPPRA) 100 MG/ML solution 500 mg  500 mg Oral BID Johnson, Clanford L, MD   500 mg at 2022-10-01 2108   morphine 100mg  in NS (1mg /mL)  infusion - premix  2-10 mg/hr Intravenous Continuous Ulice Bold, NP 2 mL/hr at 09/22/22 1103 2 mg/hr at 09/22/22 1103   morphine bolus via infusion 2 mg  2 mg Intravenous Q15 min PRN Ulice Bold, NP       mupirocin ointment (BACTROBAN) 2 % 1 Application  1 Application Nasal BID Laural Benes, Clanford L, MD   1 Application at 09/22/22 1054   nitroGLYCERIN (NITROSTAT) SL tablet 0.4 mg  0.4 mg Sublingual Q5 min PRN Johnson, Clanford L, MD       ondansetron (ZOFRAN-ODT) disintegrating tablet 4 mg  4 mg Oral Q6H PRN Ulice Bold, NP       Or   ondansetron (ZOFRAN) injection 4 mg  4 mg Intravenous Q6H PRN Ulice Bold, NP       polyethylene glycol (MIRALAX / GLYCOLAX) packet 17 g  17 g Oral Daily Johnson, Clanford L, MD       polyvinyl alcohol (LIQUIFILM TEARS) 1.4 % ophthalmic solution 1 drop  1 drop Both Eyes QID PRN Ulice Bold, NP         Discharge Medications: Please see discharge summary for a list of discharge medications.  Relevant Imaging Results:  Relevant Lab Results:   Additional Information    Jaylenn Altier, Juleen China,  LCSW

## 2022-09-22 NOTE — Progress Notes (Signed)
Patient remains asleep in bed at this time, no signs of pain or discomfort, Morphine infusion remains at 5mg /hr, scheduled ativan given.

## 2022-09-22 NOTE — Progress Notes (Signed)
Patient asleep in bed at this time, with family at bedside

## 2022-09-22 NOTE — TOC Initial Note (Signed)
Transition of Care [facility]) - Initial/Assessment Note    Patient Details  Name: Brent [name] MRN: [id number] Date of Birth: [birth year]  [facility] CM/SW Contact:    Annice Needy, LCSW Phone Number: 09/22/2022, 1:41 PM  Clinical Narrative:                 Patient is total care for ADLS, feeds self. Has indwelling cath. On oxygen. LTC at [facility].   Expected Discharge Plan: Skilled Nursing Facility     Patient Goals and CMS Choice            Expected Discharge Plan and Services       Living arrangements for the past 2 months: Skilled Nursing Facility                                      Prior Living Arrangements/Services Living arrangements for the past 2 months: Skilled Nursing Facility Lives with:: Facility Resident                   Activities of Daily Living Home Assistive Devices/Equipment: CBG Meter, Oxygen ADL Screening (condition at time of admission) Patient's cognitive ability adequate to safely complete daily activities?: Yes Is the patient deaf or have difficulty hearing?: No Does the patient have difficulty seeing, even when wearing glasses/contacts?: No Does the patient have difficulty concentrating, remembering, or making decisions?: No Patient able to express need for assistance with ADLs?: Yes Does the patient have difficulty dressing or bathing?: Yes Independently performs ADLs?: No Communication: Independent Dressing (OT): Needs assistance Is this a change from baseline?: Pre-admission baseline Grooming: Needs assistance Is this a change from baseline?: Pre-admission baseline Feeding: Needs assistance Is this a change from baseline?: Pre-admission baseline Bathing: Needs assistance Is this a change from baseline?: Pre-admission baseline Toileting: Needs assistance Is this a change from baseline?: Pre-admission baseline In/Out Bed: Dependent Is this a change from baseline?: Pre-admission  baseline Walks in Home: Dependent Is this a change from baseline?: Pre-admission baseline Does the patient have difficulty walking or climbing stairs?: Yes Weakness of Legs: Both Weakness of Arms/Hands: None  Permission Sought/Granted                  Emotional Assessment     Affect (typically observed): Appropriate Orientation: : Oriented to Self, Oriented to Place Alcohol / Substance Use: Not Applicable Psych Involvement: No (comment)  Admission diagnosis:  Acute respiratory failure with hypoxia (HCC) [J96.01] Acute on chronic respiratory failure with hypoxia (HCC) [J96.21] Patient Active Problem List   Diagnosis Date Noted   Recurrent left pleural effusion 10/01/2022   Mucus plug in respiratory tract 10/08/2022   Noncompliance 10/11/2022   DNR (do not resuscitate) 09/25/2022   Pain due to onychomycosis of toenails of both feet 02/27/2022   Paronychia of great toe of left foot 02/27/2022   Acute hypoxemic respiratory failure (HCC) 09/27/2021   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   Chronic respiratory failure with hypoxia (HCC) 09/01/2021   Insomnia 06/27/2021   Adhesive capsulitis of left shoulder 04/16/2021   History of CVA (cerebrovascular accident) 01/15/2021   Dysphagia, post-stroke    Adjustment disorder with mixed anxiety and depressed mood    Acute blood loss anemia 10/02/2020   PEG (percutaneous endoscopic gastrostomy) status (HCC)    Malnutrition of moderate degree 09/30/2020   S/P percutaneous endoscopic gastrostomy (PEG) tube placement (HCC)  Labile blood glucose    Leukocytosis    Goals of care, counseling/discussion 09/12/2020   Acute stroke due to ischemia (HCC) 09/11/2020   Acute respiratory failure with hypoxia (HCC) 09/11/2020   Aspiration pneumonitis (HCC) 09/11/2020   Dysarthria 09/11/2020   Sepsis due to undetermined organism (HCC) 09/11/2020   Cocaine abuse (HCC)    Acute CVA (cerebrovascular accident) (HCC) 07/25/2020   Infected  hematoma 06/18/2020   Acute on chronic respiratory failure with hypoxia (HCC) 11/04/2019   Rhinovirus infection 11/04/2019   Essential hypertension 11/04/2019   CAP (community acquired pneumonia) 11/03/2019   Closed fracture of rib 10/31/2019   Impingement syndrome of right shoulder 09/18/2019   Diabetic polyneuropathy associated with diabetes mellitus due to underlying condition (HCC) 02/06/2019   Localization-related (focal) (partial) symptomatic epilepsy and epileptic syndromes with complex partial seizures, intractable, without status epilepticus (HCC) 02/06/2019   Mixed diabetic hyperlipidemia associated with type 2 diabetes mellitus (HCC) 11/26/2018   Cough 11/03/2018   Obstructive sleep apnea 11/03/2018   Dysphagia-----s/p Prior Uvulectomy and Now with Acute CVA 09/27/2018   Eczema 09/27/2018   Right leg weakness 01/19/2018   History of arthroscopy of knee 11/15/2017   Complex tear of lateral meniscus of right knee as current injury 10/27/2017   Effusion of right knee 07/26/2017   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   Cellulitis 07/22/2017   Primary osteoarthritis involving multiple joints 06/22/2017   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 02/01/2017   Right knee pain 02/01/2017   Acute bronchitis 12/07/2016   Acute idiopathic gout of left foot 12/07/2016   Anxiety, generalized 12/07/2016   COPD with acute exacerbation (HCC) 12/07/2016   Seizures (HCC) 12/07/2016   CAD (coronary artery disease) 12/19/2010   Uncontrolled type 2 diabetes mellitus with hyperglycemia (HCC) 12/19/2010   Hypertension associated with diabetes (HCC) 12/19/2010   GERD (gastroesophageal reflux disease) 12/19/2010   Hypertriglyceridemia 12/19/2010   Cigarette smoker 12/19/2010   PCP:  [name], DO Pharmacy:   [facility] - [place], [place] - [address] [place] [address] Phone: [phone] Fax: [phone]  [facility] [place], [place] - [address] [place] [address] Phone: [phone] Fax: [phone]     Social Determinants of Health (SDOH) Social History: SDOH Screenings   Food Insecurity: Patient Declined (09/24/2022)  Housing: High Risk (09/24/2022)  Transportation Needs: Patient Declined (10/06/2022)  Utilities: Patient Declined (09/24/2022)  Depression (PHQ2-9): Low Risk  (07/23/2021)  Financial Resource Strain: Low Risk  (08/01/2020)   [facility] from [facility], [facility]  Physical Activity: Inactive (08/01/2020)   [facility], [facility]  Social Connections: Unknown (06/30/2021)   Received from [facility]  Stress: Stress Concern Present (08/01/2020)   Received from [facility], [facility]  Tobacco Use: Medium Risk (04/16/2022)  Health Literacy: High Risk (08/01/2020)   Received from [facility], [facility]   SDOH Interventions:     Readmission Risk Interventions     No data to display

## 2022-09-22 NOTE — Plan of Care (Signed)
  Problem: Coping: Goal: Ability to adjust to condition or change in health will improve Outcome: Not Progressing

## 2022-09-22 NOTE — Consult Note (Addendum)
Consultation Note Date: 09/22/2022   Patient Name: Brent [name]  DOB: [birth year]  MRN: [id number]  Age / Sex: 57 y.o., male  PCP: [name], DO Referring Physician: Cleora Fleet, MD  Reason for Consultation: Establishing goals of care  HPI/Patient Profile: 57 y.o. male  with past medical history of strokes, hypertension, hyperlipidemia, COPD, diabetes, history of cocaine use, bipolar disorder admitted on 10/15/2022 with hypoxia secondary to aspiration pneumonia and COPD exacerbation.   Clinical Assessment and Goals of Care: Consult received and chart review completed. Noted previous palliative notes and long history of chronic illness. I met today with [name] and spoke with him about continuing oxygen until his daughter is able to arrive for [place] to develop a plan together on how to manage his symptoms so he does not suffer. He is clear about his wishes for full comfort care and that he is tired of suffering.   I spoke with daughter, Brent [name]. She updated me on conversations with her father and his history. She shares psychosocial information and challenges over the years. We reviewed plan to initiate opioid infusion to provide him comfort and symptom relief and once comfortable we can titrate down and off oxygen to allow him peaceful death hopefully just drifting asleep. Brent [name] shares that she knows that he has been suffering and she respects his wishes and decisions. I called and spoke with [name]'s mother, Brent [name], and explained to her situation. Brent [name] is also supportive of his wishes and decisions. Both mother and daughter are able to recognize his suffering and know he has no quality of life.   Update: I followed up with Brent [name]. He is on morphine infusion and has no signs of labored breathing on room air. He continues to verbalize his desire to be comfortable and just go to sleep. I  will adjust orders to increase bolus and medications to provide comfort. Given his history of substance abuse he likely has very high tolerance and may need very high doses of medications to achieve comfort for him.   All questions/concerns addressed. Emotional support provided. Discussed with RN and Dr. Laural Benes.   Primary Decision Maker PATIENT supported by daughter and mother    SUMMARY OF RECOMMENDATIONS   - DNR - Full comfort care - Anticipate hospital death  Code Status/Advance Care Planning: DNR   Symptom Management:  Pain/shortness of breath: Morphine infusion 2-10 mg/hr. Bolus 5 mg every 30 min PRN. PRN dilaudid IV for pain/sob not relieved by morphine. If dilaudid provides more relief may require opioid rotation to dilaudid infusion.  Ativan 2 mg q4h. Valium 10 mg q6h PRN anxiety/muscle spasms.  Scheduled and PRN robinul for secretions.   Palliative Prophylaxis:  Frequent Pain Assessment  Additional Recommendations (Limitations, Scope, Preferences): Full Comfort Care  Psycho-social/Spiritual:  Desire for further Chaplaincy support:yes Additional Recommendations: Grief/Bereavement Support  Prognosis:  Hours - Days  Discharge Planning: Anticipated Hospital Death      Primary Diagnoses: Present on Admission:  Acute on chronic respiratory failure with hypoxia (HCC)  CAD (coronary  artery disease)  Uncontrolled type 2 diabetes mellitus with hyperglycemia (HCC)  Hypertension associated with diabetes (HCC)  GERD (gastroesophageal reflux disease)  Hypertriglyceridemia  Cigarette smoker  Cough  Obstructive sleep apnea  Diabetic polyneuropathy associated with diabetes mellitus due to underlying condition (HCC)  Localization-related (focal) (partial) symptomatic epilepsy and epileptic syndromes with complex partial seizures, intractable, without status epilepticus (HCC)  Anxiety, generalized  COPD with acute exacerbation (HCC)  Mixed diabetic hyperlipidemia  associated with type 2 diabetes mellitus (HCC)  Aspiration pneumonitis (HCC)  Chronic respiratory failure with hypoxia (HCC)  Recurrent left pleural effusion  Mucus plug in respiratory tract  DNR (do not resuscitate)   I have reviewed the medical record, interviewed the patient and family, and examined the patient. The following aspects are pertinent.  Past Medical History:  Diagnosis Date   acute posthemorrhagic anemia    Anxiety    Aplastic anemia, unspecified (HCC)    Bipolar disorder (HCC)    Chest pain, unspecified    Chronic airway obstruction, not elsewhere classified    Cocaine substance abuse (HCC)    Dysphagia    Gastrostomy status (HCC)    Hyperlipidemia    Mood disorder (HCC)    Neuropathy    Other and unspecified hyperlipidemia    Pneumonitis due to inhalation of food and vomit (HCC)    Polyneuropathy    Shortness of breath    Stroke (HCC) 06/17/2020   Tobacco use disorder    Type II or unspecified type diabetes mellitus without mention of complication, not stated as uncontrolled    Unspecified epilepsy without mention of intractable epilepsy    Unspecified essential hypertension    Unspecified protein-calorie malnutrition (HCC)    Social History   Socioeconomic History   Marital status: Single    Spouse name: Not on file   Number of children: Not on file   Years of education: Not on file   Highest education level: Not on file  Occupational History   Not on file  Tobacco Use   Smoking status: Former    Current packs/day: 0.00    Average packs/day: 0.3 packs/day for 30.0 years (7.5 ttl pk-yrs)    Types: Cigarettes    Start date: 09/17/1991    Quit date: 09/16/2021    Years since quitting: 1.0   Smokeless tobacco: Never  Vaping Use   Vaping status: Never Used  Substance and Sexual Activity   Alcohol use: Not Currently    Comment: drinks beer on the weekends   Drug use: Not Currently    Types: Cocaine   Sexual activity: Not Currently  Other Topics  Concern   Not on file  Social History Narrative   Lives in [place] with wife.   Has one daughter and one grandson.   Right handed    Social Determinants of Health   Financial Resource Strain: Low Risk  (08/01/2020)   Received from [facility], [facility]   Overall Financial Resource Strain (CARDIA)    Difficulty of Paying Living Expenses: Not very hard  Food Insecurity: Patient Declined (10/02/2022)   Hunger Vital Sign    Worried About Running Out of Food in the Last Year: Patient declined    Ran Out of Food in the Last Year: Patient declined  Transportation Needs: Patient Declined (10/03/2022)   PRAPARE - Administrator, Civil Service (Medical): Patient declined    Lack of Transportation (Non-Medical): Patient declined  Physical Activity: Inactive (08/01/2020)   Received from [facility], [facility]   Exercise Vital Sign    Days of Exercise per Week: 0 days    Minutes of Exercise per Session: 0 min  Stress: Stress Concern Present (08/01/2020)   Received from [facility], [facility] of Occupational Health - Occupational Stress Questionnaire    Feeling of Stress : To some extent  Social Connections: Unknown (06/30/2021)   Received from [facility]   Social Network    Social Network: Not on file   Family History  Problem Relation Age of Onset   Heart attack Father    COPD Father    Coronary artery disease Mother    Scheduled Meds:  acetylcysteine  10 mL Nebulization TID   ascorbic acid  500 mg Oral Daily   aspirin  81 mg Oral Daily   baclofen  10 mg Oral QID   Chlorhexidine Gluconate Cloth  6 each Topical Q0600   clonazePAM  0.5 mg Oral BID   clopidogrel  75 mg Oral Daily   colchicine  0.6 mg Oral BID   donepezil  10 mg Oral QHS   escitalopram  20 mg Oral Daily   fenofibrate  160 mg Oral Daily   fluticasone  1 spray Each Nare Daily   guaiFENesin  600 mg Oral BID   heparin  5,000 Units Subcutaneous Q8H   insulin  aspart  0-15 Units Subcutaneous TID WC   insulin glargine-yfgn  10 Units Subcutaneous BID   ipratropium-albuterol  3 mL Nebulization Q4H   levETIRAcetam  500 mg Oral BID   loratadine  10 mg Oral Daily   methylPREDNISolone (SOLU-MEDROL) injection  40 mg Intravenous Daily   mupirocin ointment  1 Application Nasal BID   pantoprazole  40 mg Oral QHS   polyethylene glycol  17 g Oral Daily   propranolol  10 mg Oral BID   QUEtiapine  150 mg Oral QHS   rosuvastatin  20 mg Oral Daily   traZODone  100 mg Oral QHS   Continuous Infusions:  sodium chloride 50 mL/hr at 09/22/22 0742   ampicillin-sulbactam (UNASYN) IV 3 g (09/22/22 0411)   magnesium sulfate bolus IVPB 4 g (09/22/22 0745)   PRN Meds:.acetaminophen **OR** acetaminophen, albuterol, bisacodyl, dextromethorphan, fentaNYL (SUBLIMAZE) injection, nitroGLYCERIN, ondansetron **OR** ondansetron (ZOFRAN) IV, oxyCODONE, traZODone No Known Allergies Review of Systems  Constitutional:  Positive for activity change, appetite change and fatigue.  Respiratory:  Positive for cough and shortness of breath.   Neurological:  Positive for weakness.    Physical Exam Vitals and nursing note reviewed.  Constitutional:      General: He is not in acute distress.    Appearance: He is ill-appearing.  Cardiovascular:     Rate and Rhythm: Normal rate.  Pulmonary:     Effort: No tachypnea, accessory muscle usage or respiratory distress.     Comments: Poor reserve - sats 70-80s on 50L oxygen refusing BiPAP but alert and oriented Abdominal:     General: Abdomen is flat.  Neurological:     Mental Status: He is alert and oriented to person, place, and time.  Psychiatric:        Mood and Affect: Mood is anxious.     Vital Signs: BP (!) 105/53   Pulse 63   Temp 98.2 F (36.8 C) (Oral)   Resp 15   Ht 5\' 8"  (1.727 m)   Wt 83.2 kg   SpO2 90%   BMI 27.89 kg/m  Pain  Scale: 0-10   Pain Score: 3    SpO2: SpO2: 90 % O2 Device:SpO2: 90 % O2 Flow  Rate: .O2 Flow Rate (L/min): 50 L/min  IO: Intake/output summary:  Intake/Output Summary (Last 24 hours) at 09/22/2022 0942 Last data filed at 09/22/2022 0603 Gross per 24 hour  Intake --  Output 450 ml  Net -450 ml    LBM:   Baseline Weight: Weight: 86.2 kg Most recent weight: Weight: 83.2 kg     Palliative Assessment/Data:     Time Total: 90 min  Greater than 50%  of this time was spent counseling and coordinating care related to the above assessment and plan.  Signed by: Yong Channel, NP Palliative Medicine Team Pager # [phone] (M-F 8a-5p) Team Phone # [phone] (Nights/Weekends)

## 2022-09-22 NOTE — Plan of Care (Signed)
  Problem: Education: Goal: Knowledge of General Education information will improve Description: Including pain rating scale, medication(s)/side effects and non-pharmacologic comfort measures Outcome: Progressing   Problem: Health Behavior/Discharge Planning: Goal: Ability to manage health-related needs will improve Outcome: Not Progressing   Problem: Clinical Measurements: Goal: Ability to maintain clinical measurements within normal limits will improve Outcome: Not Progressing   Problem: Clinical Measurements: Goal: Respiratory complications will improve Outcome: Not Progressing

## 2022-09-22 NOTE — Progress Notes (Signed)
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  He was sent to the emergency department for evaluation of hypoxia where he was found to have a pulse ox in the 70s this morning by staff.  They report he likely aspirated after eating breakfast.  He was having significant wheezing, chest congestion and respiratory distress with stridor.  In the ED he was suctioned and copious amounts of mucus and phlegm and aspirate from breakfast was removed and he showed some improvement in oxygenation however shortly decompensated again and required high flow nasal cannula.  His chest x-ray does suggest some aspiration and atelectasis.  There was minimal amount of left pleural effusion present but not enough fluid for thoracentesis.  Pt is DNR/DNI and he will be admitted for further management.  Pt had been on a dysphagia 1 diet with thickened liquids at the facility.  After 2 years of having a PEG he says he will never use a PEG tube again and accepts the risks of aspiration, including death.     Assessment and Plan:  Acute on chronic  respiratory failure with hypoxia  - secondary to recurrent aspiration  - secondary to mucus plugging  - started mucomyst neb treatment - started duoneb treatments  - continue oxygen support to get pulse ox >87%  - he has been unable to maintain oxygen saturation without bipap but refusing bipap therapy  - we have asked for palliative care conference with patient and family to discuss goals of care and could transition to full comfort measures if patient chooses as he now expresses he doesn't desire to continue with full scope care.    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 - check A1c,  - SSI coverage ordered with frequent CBG monitoring - resume home insulin therapy when medications reconciled    DNR present on admission  - resume DNR order while in hospital - reviewed ACP documents, current MOST form and [place] DNR form reviewed   COPD exacerbation  -  possibly due to acute severe aspiration event  - IV steroids, IV antibiotics - scheduled bronchodilators ordered   History of post CVA seizures - resume home anti-epileptics when  home meds reconciled    Recurrent left pleural effusion  - radiology reviewed and not enough fluid for a safe thoracentesis - incentive spirometry ordered   DVT prophylaxis: [place] heparin  Code Status: DNR  Family Communication: daughter/mother multiple  conversations;  Disposition: TBD   Consultants:   Procedures:   Antimicrobials:  Unasyn 8/5>>  Subjective: Pt saying he is "ready to die" "he is tired of all this" he still refusing bipap and cannot maintain oxygenation with maximal heated high flow treatments;   Objective: Vitals:   09/22/22 0739 09/22/22 0800 09/22/22 0856 09/22/22 0942  BP:      Pulse:      Resp:      Temp:      TempSrc:      SpO2: (!) 87% 92% 90% (!) 84%  Weight:      Height:        Intake/Output Summary (Last 24 hours) at 09/22/2022 1046 Last data filed at 09/22/2022 0603 Gross per 24 hour  Intake --  Output 450 ml  Net -450 ml   Filed Weights   09-30-22 1029 2022/09/30 1500  Weight: 86.2 kg 83.2 kg   Examination:  General exam: chronically ill appearing male, he is oriented x 3 and full decisional capacity per my exam;  Respiratory system: diminished BS RLL; rales heard on left side.  Cardiovascular system: normal S1 & S2 heard.  Gastrointestinal system: Abdomen is nondistended, soft and nontender. No organomegaly or masses felt. Normal bowel sounds heard. Central nervous system: Alert and oriented. No focal neurological deficits. Extremities: Symmetric 5 x 5 power. Skin: No rashes, lesions or ulcers. Psychiatry: Judgement and insight appear normal. Mood & affect flat and agitated at times   Data Reviewed: I have personally reviewed following labs and imaging studies  CBC: Recent Labs  Lab 30-Sep-2022 1106 09/22/22 0515  WBC 8.7 8.8  NEUTROABS 6.8 7.3  HGB 10.2* 8.7*  HCT 34.8* 28.6*  MCV 89.9 87.7  PLT 369 343    Basic Metabolic Panel: Recent Labs  Lab 09/30/2022 1106 09/22/22 0515  NA 138 138  K 4.0 4.4  CL 100 99  CO2 32 29  GLUCOSE 147* 221*  BUN 13 19  CREATININE 1.07 1.11  CALCIUM 8.7* 9.0  MG  --  1.5*    CBG: Recent Labs  Lab 30-Sep-2022 1639 09-30-2022 2206 09/22/22 0238 09/22/22 0733  GLUCAP 208* 277* 235* 195*    Recent Results (from the past 240 hour(s))   MRSA Next Gen by PCR, Nasal     Status: Abnormal   Collection Time: 2022/09/30  3:00 PM   Specimen: Nasal Mucosa; Nasal Swab  Result Value Ref Range Status   MRSA by PCR Next Gen DETECTED (A) NOT DETECTED Final    Comment: RESULT CALLED TO, READ BACK BY AND VERIFIED WITH: R. ESTOCE AT 0053 ON 08.06.24 BY ADGER J         The GeneXpert MRSA Assay (FDA approved for NASAL specimens only), is one component of a comprehensive MRSA colonization surveillance program. It is not intended to diagnose MRSA infection nor to guide or monitor treatment for MRSA infections. Performed at [facility], [address] [place], [place] [address]     Radiology Studies: Portable chest 1 View  Result Date: 09/22/2022 CLINICAL DATA:  57 year old male with history of aspiration pneumonia. EXAM: PORTABLE CHEST 1 VIEW COMPARISON:  Chest x-ray 09/30/22. FINDINGS: Complete opacification of the left hemithorax now noted. Enlarging small right pleural effusion. Opacity at  the right base likely subsegmental atelectasis. Right lung is otherwise clear. No evidence of pulmonary edema. Cardiac silhouette is completely obscured at this time. Atherosclerotic calcifications in the thoracic aorta. IMPRESSION: 1. Worsening aeration in the left hemithorax, suggesting enlarging left pleural effusion with increasing areas of atelectasis and/or consolidation in the underlying lung. 2. Enlarging small right pleural effusion with probable subsegmental atelectasis in the right lung base. 3. Aortic atherosclerosis. Electronically Signed   By: Trudie Reed M.D.   On: 09/22/2022 05:13   [place] CHEST (PLEURAL EFFUSION)  Result Date: 10-17-2022 CLINICAL DATA:  Left pleural effusion. EXAM: CHEST ULTRASOUND COMPARISON:  Radiograph of same day. FINDINGS: Sonographic evaluation of the left chest demonstrates a small left pleural effusion with a large amount of adjacent atelectatic lung. Adequate pocket for thoracentesis is not noted. IMPRESSION:  Small left pleural effusion is noted with a large amount of adjacent atelectatic lung. No adequate fluid pocket for thoracentesis is noted. Electronically Signed   By: Lupita Raider M.D.   On: 2022/10/17 14:29   DG Foot Complete Right  Result Date: 2022/10/17 CLINICAL DATA:  Pain and swelling EXAM: RIGHT FOOT COMPLETE - 3+ VIEW COMPARISON:  None Available. FINDINGS: No recent fracture or dislocation is seen. Deformity in the shaft of proximal phalanx of fifth toe may be residual from previous injury. Degenerative changes are noted with bony spurs in first metatarsophalangeal joint. Arterial calcifications are seen in soft tissues. IMPRESSION: No recent fracture or dislocation is seen. Deformity of the shaft of proximal phalanx of fifth toe may suggest old healed fracture. Arteriosclerosis. Electronically Signed   By: Ernie Avena M.D.   On: 17-Oct-2022 13:25   DG Chest Port 1 View  Result Date: 10/17/22 CLINICAL DATA:  Hypoxia EXAM: PORTABLE CHEST 1 VIEW COMPARISON:  CXR 01/14/22 FINDINGS: Moderate left-sided pleural effusion, increased from prior exam. Superimposed airspace opacity at the left base could represent atelectasis or infection. Unchanged cardiac and mediastinal contours. No radiographically apparent displaced rib fractures. Visualized upper abdomen unremarkable. IMPRESSION: Moderate left-sided pleural effusion, increased from prior exam. Superimposed airspace opacity at the left lung base could represent atelectasis or infection. Electronically Signed   By: Lorenza Cambridge M.D.   On: 10/17/22 12:32    Scheduled Meds:  Chlorhexidine Gluconate Cloth  6 each Topical Q0600   diazepam  5 mg Intravenous Q8H   glycopyrrolate  0.4 mg Intravenous TID   ipratropium-albuterol  3 mL Nebulization Q4H   levETIRAcetam  500 mg Oral BID   mupirocin ointment  1 Application Nasal BID   polyethylene glycol  17 g Oral Daily   Continuous Infusions:  sodium chloride 50 mL/hr at 09/22/22 0742    morphine      LOS: 1 day   Time spent: 46 mins  Brent Weatherman Laural Benes, MD How to contact the [facility] Attending or Consulting provider 7A - 7P or covering provider during after hours 7P -7A, for this patient?  Check the care team in [facility] and look for a) attending/consulting [facility] provider listed and b) the [facility] team listed Log into [email or web address] and use [facility]'s universal password to access. If you do not have the password, please contact the hospital operator. Locate the [facility] provider you are looking for under [facility] and page to a number that you can be directly reached. If you still have difficulty reaching the provider, please page the [facility] (Director on Call) for the Hospitalists listed on amion for assistance.  09/22/2022, 10:46 AM

## 2022-09-23 DIAGNOSIS — J9621 Acute and chronic respiratory failure with hypoxia: Secondary | ICD-10-CM | POA: Diagnosis not present

## 2022-10-18 NOTE — Death Summary Note (Signed)
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  He was sent to the emergency department for evaluation of hypoxia where he was found to have a pulse ox in the 70s this morning by staff.  They report he likely aspirated after eating breakfast.  He was having significant wheezing, chest congestion and respiratory distress with stridor.  In the ED he was suctioned and copious amounts of mucus and phlegm and aspirate from breakfast was removed and he showed some improvement in oxygenation however shortly decompensated again and required high flow nasal cannula.  His chest x-ray does suggest some aspiration and atelectasis.  There was minimal amount of left pleural effusion present but not enough fluid for thoracentesis.  Pt is DNR/DNI and he will be admitted for further management.  Pt had been on a dysphagia 1 diet with thickened liquids at the facility.  After 2 years of having a PEG he says he will never use a PEG tube again and  accepts the risks of aspiration, including death.    Diagnoses  Preliminary cause of death:  Secondary Diagnoses (including complications and co-morbidities):  Principal Problem:   Acute on chronic respiratory failure with hypoxia (HCC) Active Problems:   CAD (coronary artery disease)   Uncontrolled type 2 diabetes mellitus with hyperglycemia (HCC)   Hypertension associated with diabetes (HCC)   GERD (gastroesophageal reflux disease)   Hypertriglyceridemia   Cigarette smoker   Dysphagia-----s/p Prior Uvulectomy and Now with Acute CVA   Cough   Obstructive sleep apnea   Diabetic polyneuropathy associated with diabetes mellitus due to underlying condition (HCC)   Localization-related (focal) (partial) symptomatic epilepsy and epileptic syndromes with complex partial seizures, intractable, without status epilepticus (HCC)   Anxiety, generalized   COPD with acute exacerbation (HCC)   Mixed diabetic hyperlipidemia associated with type 2 diabetes mellitus (HCC)   Seizures (HCC)   Aspiration pneumonitis (HCC)   Dysphagia, post-stroke   History of CVA (cerebrovascular accident)   Chronic respiratory failure with hypoxia (HCC)   Recurrent left pleural effusion   Mucus plug in respiratory tract   Noncompliance   DNR (do not resuscitate)   Brief Hospital Course (including significant findings, care, treatment, and services provided and events leading to death)  Pt initially came in for acute treatment but after being on a brief trial of bipap he decided he did not want further aggressive treatments.  He began to refuse bipap and refuse other treatments.  We requested a palliative care consultation where patient met with his mother, daughter and with palliative care and he decided he wanted to pursue full comfort care measures only and he was fully  in acceptance of that decision.    Pertinent Labs and Studies  Significant Diagnostic Studies Portable chest 1 View  Result Date:  09/22/2022 CLINICAL DATA:  57 year old male with history of aspiration pneumonia. EXAM: PORTABLE CHEST 1 VIEW COMPARISON:  Chest x-ray 10/12/2022. FINDINGS: Complete opacification of the left hemithorax now noted. Enlarging small right pleural effusion. Opacity at the right base likely subsegmental atelectasis. Right lung is otherwise clear. No evidence of pulmonary edema. Cardiac silhouette is completely obscured at this time. Atherosclerotic calcifications in the thoracic aorta. IMPRESSION: 1. Worsening aeration in the left hemithorax, suggesting enlarging left pleural effusion with increasing areas of atelectasis and/or consolidation in the underlying lung. 2. Enlarging small right pleural effusion with probable subsegmental atelectasis in the right lung base. 3. Aortic atherosclerosis. Electronically Signed   By: Trudie Reed M.D.   On: 09/22/2022 05:13   [place] CHEST (PLEURAL EFFUSION)  Result Date: 10/03/2022 CLINICAL DATA:  Left pleural effusion. EXAM: CHEST ULTRASOUND COMPARISON:  Radiograph of same day. FINDINGS: Sonographic evaluation of the left chest demonstrates a small left pleural effusion with a large amount of adjacent atelectatic lung. Adequate pocket for thoracentesis is not noted. IMPRESSION: Small left pleural effusion is noted with a large amount of adjacent atelectatic lung. No adequate fluid pocket for thoracentesis is noted. Electronically Signed   By: Lupita Raider M.D.   On: 10/15/2022 14:29   DG Foot Complete Right  Result Date: 10/13/2022 CLINICAL DATA:  Pain and swelling EXAM: RIGHT FOOT COMPLETE - 3+ VIEW COMPARISON:  None Available. FINDINGS: No recent fracture or dislocation is seen. Deformity in the shaft of proximal phalanx of fifth toe may be residual from previous injury. Degenerative changes are noted with bony spurs in first metatarsophalangeal joint. Arterial calcifications are seen in soft tissues. IMPRESSION: No recent fracture or dislocation is seen. Deformity of the  shaft of proximal phalanx of fifth toe may suggest old healed fracture. Arteriosclerosis. Electronically Signed   By: Ernie Avena M.D.   On: 09/18/2022 13:25   DG Chest Port 1 View  Result Date: 10/08/2022 CLINICAL DATA:  Hypoxia EXAM: PORTABLE CHEST 1 VIEW COMPARISON:  CXR 01/14/22 FINDINGS: Moderate left-sided pleural effusion, increased from prior exam. Superimposed airspace opacity at the left base could represent atelectasis or infection. Unchanged cardiac and mediastinal contours. No radiographically apparent displaced rib fractures. Visualized upper abdomen unremarkable. IMPRESSION: Moderate left-sided pleural effusion, increased from prior exam. Superimposed airspace opacity at the left lung base could represent atelectasis or infection. Electronically Signed   By: Lorenza Cambridge M.D.   On: 10/14/2022 12:32    Microbiology Recent Results (from the past 240 hour(s))  MRSA Next Gen by PCR, Nasal     Status: Abnormal   Collection Time: 10/01/2022  3:00 PM   Specimen: Nasal Mucosa; Nasal Swab  Result Value Ref Range Status   MRSA by PCR Next Gen DETECTED (A) NOT DETECTED Final    Comment: RESULT CALLED TO, READ BACK BY AND VERIFIED WITH: R. ESTOCE AT 0053 ON 08.06.24 BY ADGER J         The GeneXpert MRSA Assay (FDA approved for NASAL specimens only), is one component of a comprehensive MRSA colonization surveillance program. It is not intended to diagnose MRSA infection nor to guide or monitor treatment for MRSA infections. Performed at [facility], [address], [place], [place] [address]     Lab Basic Metabolic Panel: Recent Labs  Lab 10/15/2022 1106 09/22/22 0515  NA 138 138  K 4.0  4.4  CL 100 99  CO2 32 29  GLUCOSE 147* 221*  BUN 13 19  CREATININE 1.07 1.11  CALCIUM 8.7* 9.0  MG  --  1.5*   Liver Function Tests: Recent Labs  Lab 09/22/22 0515  AST 12*  ALT 10  ALKPHOS 51  BILITOT 0.4  PROT 6.6  ALBUMIN 2.5*   No results for input(s): "LIPASE",  "AMYLASE" in the last 168 hours. No results for input(s): "AMMONIA" in the last 168 hours. CBC: Recent Labs  Lab 10/12/2022 1106 09/22/22 0515  WBC 8.7 8.8  NEUTROABS 6.8 7.3  HGB 10.2* 8.7*  HCT 34.8* 28.6*  MCV 89.9 87.7  PLT 369 343   Cardiac Enzymes: No results for input(s): "CKTOTAL", "CKMB", "CKMBINDEX", "TROPONINI" in the last 168 hours. Sepsis Labs: Recent Labs  Lab 10/04/2022 1106 09/22/2022 1252 09/22/22 0515  WBC 8.7  --  8.8  LATICACIDVEN  --  0.8  --     Procedures/Operations     Cieanna Stormes 09/25/2022, 1:27 PM

## 2022-10-18 NOTE — Progress Notes (Signed)
Patient started stirring around in bed, and presented with facial grimacing, PRN morphine bolus given, patient lying in bed at this time, family remains at bedside.

## 2022-10-18 NOTE — Progress Notes (Signed)
Patient resting in bed at this time, clean, warm, and dry

## 2022-10-18 NOTE — Progress Notes (Signed)
Remainder of morphine drip wasted, Turkey Bass,RN witness waste

## 2022-10-18 NOTE — Progress Notes (Signed)
   09/22/22 1422  Spiritual Encounters  Type of Visit Initial  Care provided to: Patient  Conversation partners present during encounter Nurse  Referral source Nurse (RN/NT/LPN)  Reason for visit End-of-life  OnCall Visit No  Spiritual Framework  Presenting Themes Meaning/purpose/sources of inspiration;Values and beliefs;Courage hope and growth  Community/Connection Family  Patient Stress Factors None identified  Family Stress Factors Exhausted;Loss  Interventions  Spiritual Care Interventions Made Established relationship of care and support;Reflective listening;Normalization of emotions;Narrative/life review;Prayer  Intervention Outcomes  Outcomes Connection to spiritual care;Awareness of support;Reduced anxiety;Reduced fear  Spiritual Care Plan  Spiritual Care Issues Still Outstanding No further spiritual care needs at this time (see row info)   Referred to patient by Unit Nurse; Spoke with patient daughter and provided compassionate presence, opportunity for reflective listening, normalization of emotions, and opportunity for life review today. Daughter spoke about how her father said he was ready and that he understood he is dying. She said that even though she might not be happy about it she does respect his decision and recognizes that he is existing at this time and not really living. She shared a little about him and this Clinical research associate was able to pray with her. Chaplain will remain available in order to provide spiritual support and to assess for spiritual need.   Rev. Jolyn Lent, M.Div Chaplain

## 2022-10-18 NOTE — Progress Notes (Addendum)
Patients o2 at 51% on room air, patient refusing oxygen. Patient on comfort care,and he stated he just wants to die at the beginning of this shift. Daughter stated she does not want him on oxygen due to his decisions.

## 2022-10-18 DEATH — deceased
